# Patient Record
Sex: Female | Born: 1950 | Race: White | Hispanic: No | Marital: Married | Smoking: Former smoker
Health system: Southern US, Community
[De-identification: ages and names within clinical notes are randomized; demographics above are authoritative.]

## PROBLEM LIST (undated history)

## (undated) DIAGNOSIS — F329 Major depressive disorder, single episode, unspecified: Secondary | ICD-10-CM

## (undated) DIAGNOSIS — F32A Depression, unspecified: Secondary | ICD-10-CM

## (undated) DIAGNOSIS — M1990 Unspecified osteoarthritis, unspecified site: Secondary | ICD-10-CM

## (undated) DIAGNOSIS — K219 Gastro-esophageal reflux disease without esophagitis: Secondary | ICD-10-CM

## (undated) DIAGNOSIS — T8130XA Disruption of wound, unspecified, initial encounter: Secondary | ICD-10-CM

## (undated) DIAGNOSIS — F419 Anxiety disorder, unspecified: Secondary | ICD-10-CM

## (undated) DIAGNOSIS — I639 Cerebral infarction, unspecified: Secondary | ICD-10-CM

## (undated) DIAGNOSIS — I219 Acute myocardial infarction, unspecified: Secondary | ICD-10-CM

## (undated) HISTORY — PX: BACK SURGERY: SHX140

## (undated) HISTORY — PX: BREAST SURGERY: SHX581

## (undated) HISTORY — PX: TUBAL LIGATION: SHX77

## (undated) HISTORY — PX: APPENDECTOMY: SHX54

## (undated) HISTORY — PX: CHOLECYSTECTOMY: SHX55

---

## 1989-04-27 DIAGNOSIS — I219 Acute myocardial infarction, unspecified: Secondary | ICD-10-CM

## 1989-04-27 HISTORY — PX: CARDIAC CATHETERIZATION: SHX172

## 1989-04-27 HISTORY — DX: Acute myocardial infarction, unspecified: I21.9

## 1999-08-20 ENCOUNTER — Other Ambulatory Visit: Admission: RE | Admit: 1999-08-20 | Discharge: 1999-08-20 | Payer: Self-pay | Admitting: Family Medicine

## 2012-11-10 ENCOUNTER — Other Ambulatory Visit: Payer: Self-pay | Admitting: Neurological Surgery

## 2012-11-10 DIAGNOSIS — M48061 Spinal stenosis, lumbar region without neurogenic claudication: Secondary | ICD-10-CM

## 2015-06-04 ENCOUNTER — Other Ambulatory Visit: Payer: Self-pay | Admitting: Neurological Surgery

## 2015-06-12 ENCOUNTER — Encounter (HOSPITAL_COMMUNITY): Payer: Self-pay

## 2015-06-12 ENCOUNTER — Encounter (HOSPITAL_COMMUNITY)
Admission: RE | Admit: 2015-06-12 | Discharge: 2015-06-12 | Disposition: A | Discharge status: Home / Self Care | Payer: Medicare Other | Source: Ambulatory Visit | Attending: Neurological Surgery | Admitting: Neurological Surgery

## 2015-06-12 DIAGNOSIS — F329 Major depressive disorder, single episode, unspecified: Secondary | ICD-10-CM | POA: Diagnosis not present

## 2015-06-12 DIAGNOSIS — Z9049 Acquired absence of other specified parts of digestive tract: Secondary | ICD-10-CM | POA: Diagnosis not present

## 2015-06-12 DIAGNOSIS — M1990 Unspecified osteoarthritis, unspecified site: Secondary | ICD-10-CM | POA: Diagnosis not present

## 2015-06-12 DIAGNOSIS — I2510 Atherosclerotic heart disease of native coronary artery without angina pectoris: Secondary | ICD-10-CM | POA: Diagnosis not present

## 2015-06-12 DIAGNOSIS — Z6835 Body mass index (BMI) 35.0-35.9, adult: Secondary | ICD-10-CM | POA: Diagnosis not present

## 2015-06-12 DIAGNOSIS — Z87891 Personal history of nicotine dependence: Secondary | ICD-10-CM | POA: Diagnosis not present

## 2015-06-12 DIAGNOSIS — Z79899 Other long term (current) drug therapy: Secondary | ICD-10-CM | POA: Diagnosis not present

## 2015-06-12 DIAGNOSIS — M4806 Spinal stenosis, lumbar region: Secondary | ICD-10-CM | POA: Diagnosis not present

## 2015-06-12 HISTORY — DX: Depression, unspecified: F32.A

## 2015-06-12 HISTORY — DX: Acute myocardial infarction, unspecified: I21.9

## 2015-06-12 HISTORY — DX: Major depressive disorder, single episode, unspecified: F32.9

## 2015-06-12 HISTORY — DX: Unspecified osteoarthritis, unspecified site: M19.90

## 2015-06-12 LAB — BASIC METABOLIC PANEL
Anion gap: 8 (ref 5–15)
BUN: 12 mg/dL (ref 6–20)
CO2: 27 mmol/L (ref 22–32)
Calcium: 9.5 mg/dL (ref 8.9–10.3)
Chloride: 107 mmol/L (ref 101–111)
Creatinine, Ser: 0.72 mg/dL (ref 0.44–1.00)
GFR calc Af Amer: >60 mL/min (ref >60)
GFR calc non Af Amer: >60 mL/min (ref >60)
Glucose, Bld: 110 mg/dL — ABNORMAL HIGH (ref 65–99)
Potassium: 4 mmol/L (ref 3.5–5.1)
Sodium: 142 mmol/L (ref 135–145)

## 2015-06-12 LAB — CBC
HCT: 39.8 % (ref 36.0–46.0)
Hemoglobin: 13.4 g/dL (ref 12.0–15.0)
MCH: 31.8 pg (ref 26.0–34.0)
MCHC: 33.7 g/dL (ref 30.0–36.0)
MCV: 94.3 fL (ref 78.0–100.0)
Platelets: 287 10*3/uL (ref 150–400)
RBC: 4.22 MIL/uL (ref 3.87–5.11)
RDW: 12 % (ref 11.5–15.5)
WBC: 7.2 10*3/uL (ref 4.0–10.5)

## 2015-06-12 LAB — SURGICAL PCR SCREEN
MRSA, PCR: NEGATIVE
Staphylococcus aureus: POSITIVE — AB

## 2015-06-12 NOTE — Progress Notes (Signed)
Anesthesia PAT Evaluation: Patient is a 65 year old female scheduled for L3-4, L4-5, L5-S1 lumbar laminectomy (OR room booked for 3 hours) on 06/14/15 by Dr. Yetta Good.  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 Nicki Good, arthritis, depression, cholecystectomy (age 38), left breast cyst surgery, appendectomy (age 59), former smoker (quit 04/10/13). PCP is Dr. Jeannetta Good. She says she has not seen Dr. Tresa Good since 1992. BMI 35.69.  Meds include Tums, Cipro, Prilosec, Percocet, Sudafed (to hold), Requip, Zoloft.   PAT Vitals: BP 141/85, HR 89, RR 20, T 37, O2 sat 95%. Heart RRR, no murmur noted. Lungs clear. No carotid bruit noted. No ankle edema.   06/12/15 EKG: NSR.  Preoperative labs noted.  I evaluated patient during her PAT visit. She reports that in 1991 which suffering from a bad cold she developed severe chest pain. Dr. Jeannetta Good referred her to [name] Good. She is fairly certain that she ruled in for MI by cardiac enzymes. She had a cardiac cath by Dr. Nicki Good. She thinks she was told she had a 30% blockage. She did not require PCI. She states, "my arteries were trying to collapse on me" so I question if there was a vasospasm involved. Unfortunately, medical records cannot locate any cardiac records on this patient. Patient said previous maiden name was [name] Good and then [name] Good after her first marriage, but said she has been [name] Good since before her MI. She denied chest pain, SOB, syncope. She gets occasional brief sensations that her heart is racing. Her activity has been limited for years, particularly since 02/2015 due to back and right hip pain. Even prior to November she was having to rest during vacuuming due to back/hip pain. I advised that I would discuss with one of our anesthesiologists if we did not get cardiology records or if records received showed worrisome findings to see if she would require pre-operative cardiology evaluation.  I discussed  above with anesthesiologist Dr. Council Good. Patient has a questionable history of prior MI that did not require intervention. Event was > 25 years ago, and she has remained asymptomatic from a CV standpoint. She denied CHF or arrhythmia history. No ischemic changes noted on her EKG. If no acute changes then it is anticipated that she can proceed as planned.  [name] Good [facility] Short Stay Good/Anesthesiology Phone [phone] 06/12/2015 3:55 PM

## 2015-06-12 NOTE — Pre-Procedure Instructions (Addendum)
[name] Good  06/12/2015      [facility] - [place], [place] - [address] [place] [address] [address] [place] [address] Phone: [phone] Fax: [phone]    Your procedure is scheduled on 06/14/15  Report to [facility] Admitting at 915 A.M.  Call this number if you have problems the morning of surgery:  [phone]   Remember:  Do not eat food or drink liquids after midnight.  Take these medicines the morning of surgery with A SIP OF WATER prilosec,pain med  STOP all herbel meds, nsaids (aleve,naproxen,advil,ibuprofen) prior to surgery starting TODAY including aspirin ,vitamins,salonpas,relax and sleep,sudafed, vitD   Do not wear jewelry, make-up or nail polish.  Do not wear lotions, powders, or perfumes.  You may wear deodorant.  Do not shave 48 hours prior to surgery.  Men may shave face and neck.  Do not bring valuables to the hospital.  [facility] is not responsible for any belongings or valuables.  Contacts, dentures or bridgework may not be worn into surgery.  Leave your suitcase in the car.  After surgery it may be brought to your room.  For patients admitted to the hospital, discharge time will be determined by your treatment team.  Patients discharged the day of surgery will not be allowed to drive home.   Name and phone number of your driver:   Special instructions:   Special Instructions: [place] - Preparing for Surgery  Before surgery, you can play an important role.  Because skin is not sterile, your skin needs to be as free of germs as possible.  You can reduce the number of germs on you skin by washing with CHG (chlorahexidine gluconate) soap before surgery.  CHG is an antiseptic cleaner which kills germs and bonds with the skin to continue killing germs even after washing.  Please DO NOT use if you have an allergy to CHG or antibacterial soaps.  If your skin becomes reddened/irritated stop using the  CHG and inform your nurse when you arrive at Short Stay.  Do not shave (including legs and underarms) for at least 48 hours prior to the first CHG shower.  You may shave your face.  Please follow these instructions carefully:   1.  Shower with CHG Soap the night before surgery and the morning of Surgery.  2.  If you choose to wash your hair, wash your hair first as usual with your normal shampoo.  3.  After you shampoo, rinse your hair and body thoroughly to remove the Shampoo.  4.  Use CHG as you would any other liquid soap.  You can apply chg directly  to the skin and wash gently with scrungie or a clean washcloth.  5.  Apply the CHG Soap to your body ONLY FROM THE NECK DOWN.  Do not use on open wounds or open sores.  Avoid contact with your eyes ears, mouth and genitals (private parts).  Wash genitals (private parts)       with your normal soap.  6.  Wash thoroughly, paying special attention to the area where your surgery will be performed.  7.  Thoroughly rinse your body with warm water from the neck down.  8.  DO NOT shower/wash with your normal soap after using and rinsing off the CHG Soap.  9.  Pat yourself dry with a clean towel.            10.  Wear clean  pajamas.            11.  Place clean sheets on your bed the night of your first shower and do not sleep with pets.  Day of Surgery  Do not apply any lotions/deodorants the morning of surgery.  Please wear clean clothes to the hospital/surgery center.  Please read over the following fact sheets that you were given. Pain Booklet, Coughing and Deep Breathing, MRSA Information and Surgical Site Infection Prevention

## 2015-06-13 MED ORDER — CEFAZOLIN SODIUM-DEXTROSE 2-3 GM-% IV SOLR
2.0000 g | INTRAVENOUS | Status: AC
  Administered 2015-06-14: 2 g via INTRAVENOUS
  Filled 2015-06-13: qty 50

## 2015-06-14 ENCOUNTER — Encounter (HOSPITAL_COMMUNITY): Payer: Self-pay | Admitting: *Deleted

## 2015-06-14 ENCOUNTER — Ambulatory Visit (HOSPITAL_COMMUNITY): Payer: Medicare Other | Admitting: Anesthesiology

## 2015-06-14 ENCOUNTER — Ambulatory Visit (HOSPITAL_COMMUNITY)
Admission: RE | Admit: 2015-06-14 | Discharge: 2015-06-15 | Disposition: A | Discharge status: Home / Self Care | Payer: Medicare Other | Source: Ambulatory Visit | Attending: Neurological Surgery | Admitting: Neurological Surgery

## 2015-06-14 ENCOUNTER — Ambulatory Visit (HOSPITAL_COMMUNITY): Payer: Medicare Other | Admitting: Vascular Surgery

## 2015-06-14 ENCOUNTER — Encounter (HOSPITAL_COMMUNITY)
Admission: RE | Disposition: A | Discharge status: Home / Self Care | Payer: Self-pay | Source: Ambulatory Visit | Attending: Neurological Surgery

## 2015-06-14 ENCOUNTER — Ambulatory Visit (HOSPITAL_COMMUNITY): Payer: Medicare Other

## 2015-06-14 DIAGNOSIS — I2510 Atherosclerotic heart disease of native coronary artery without angina pectoris: Secondary | ICD-10-CM | POA: Insufficient documentation

## 2015-06-14 DIAGNOSIS — F329 Major depressive disorder, single episode, unspecified: Secondary | ICD-10-CM | POA: Insufficient documentation

## 2015-06-14 DIAGNOSIS — M4806 Spinal stenosis, lumbar region: Secondary | ICD-10-CM | POA: Diagnosis not present

## 2015-06-14 DIAGNOSIS — Z87891 Personal history of nicotine dependence: Secondary | ICD-10-CM | POA: Insufficient documentation

## 2015-06-14 DIAGNOSIS — Z9889 Other specified postprocedural states: Secondary | ICD-10-CM

## 2015-06-14 DIAGNOSIS — M1990 Unspecified osteoarthritis, unspecified site: Secondary | ICD-10-CM | POA: Insufficient documentation

## 2015-06-14 DIAGNOSIS — Z79899 Other long term (current) drug therapy: Secondary | ICD-10-CM | POA: Insufficient documentation

## 2015-06-14 DIAGNOSIS — Z419 Encounter for procedure for purposes other than remedying health state, unspecified: Secondary | ICD-10-CM

## 2015-06-14 DIAGNOSIS — Z6835 Body mass index (BMI) 35.0-35.9, adult: Secondary | ICD-10-CM | POA: Insufficient documentation

## 2015-06-14 DIAGNOSIS — Z9049 Acquired absence of other specified parts of digestive tract: Secondary | ICD-10-CM | POA: Insufficient documentation

## 2015-06-14 HISTORY — PX: LUMBAR LAMINECTOMY/DECOMPRESSION MICRODISCECTOMY: SHX5026

## 2015-06-14 IMAGING — CR DG LUMBAR SPINE 2-3V
2 series · 2 of 2 positions shown · non-contrast
Comparison: None.

CLINICAL DATA: L3-L4, L4-L5 and L5-S1 laminectomy

EXAM:
LUMBAR SPINE - 2-3 VIEW

[lat (1 of 2)]
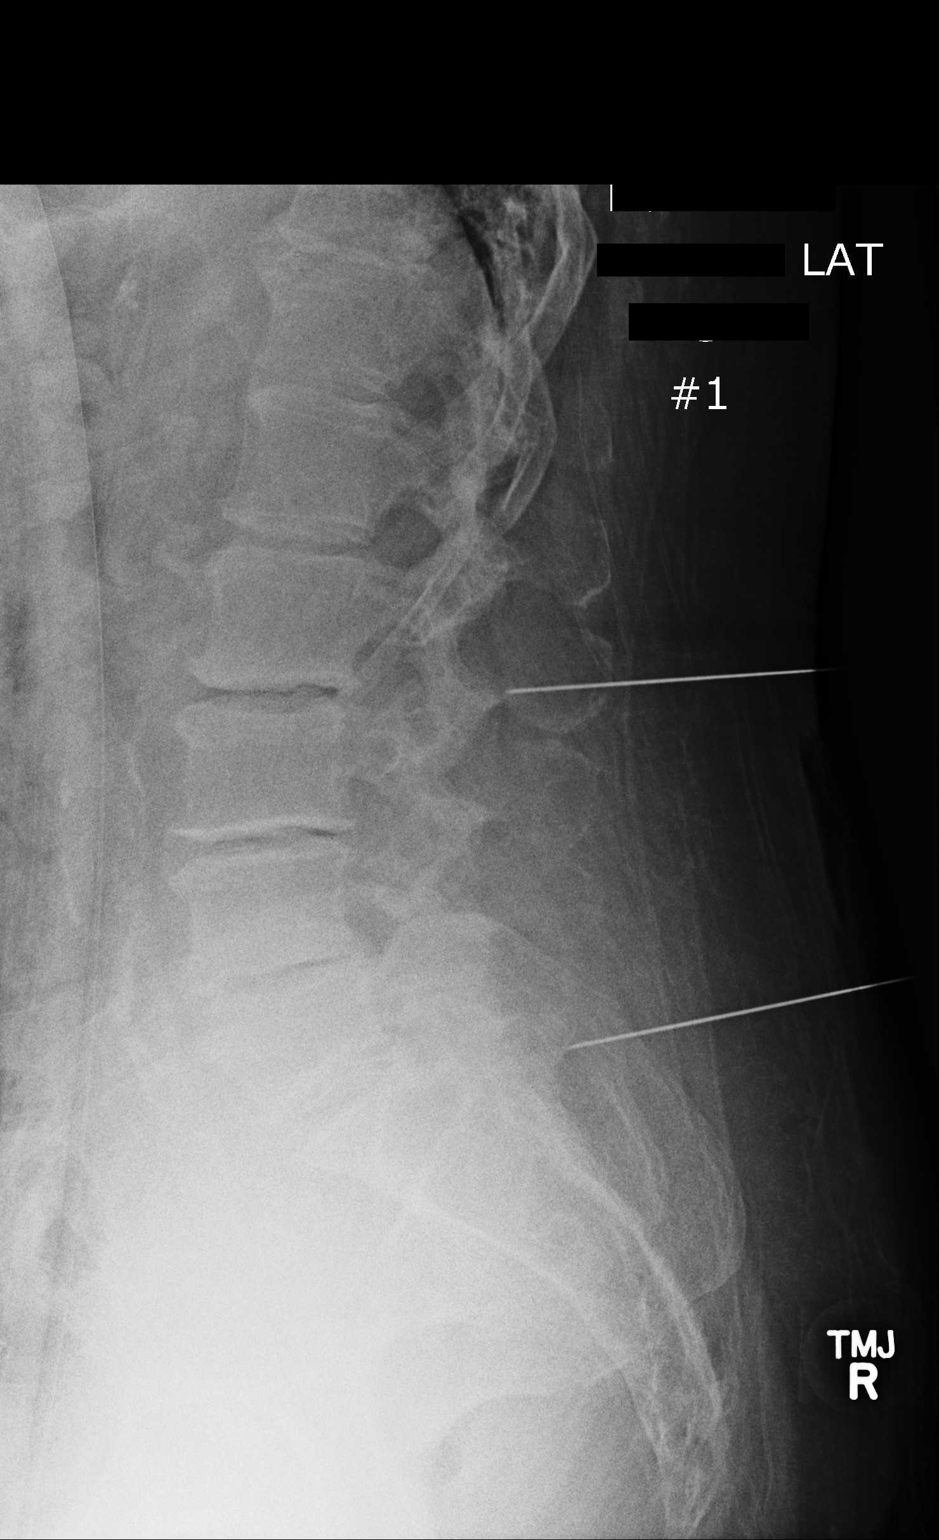

[lat (2 of 2)]
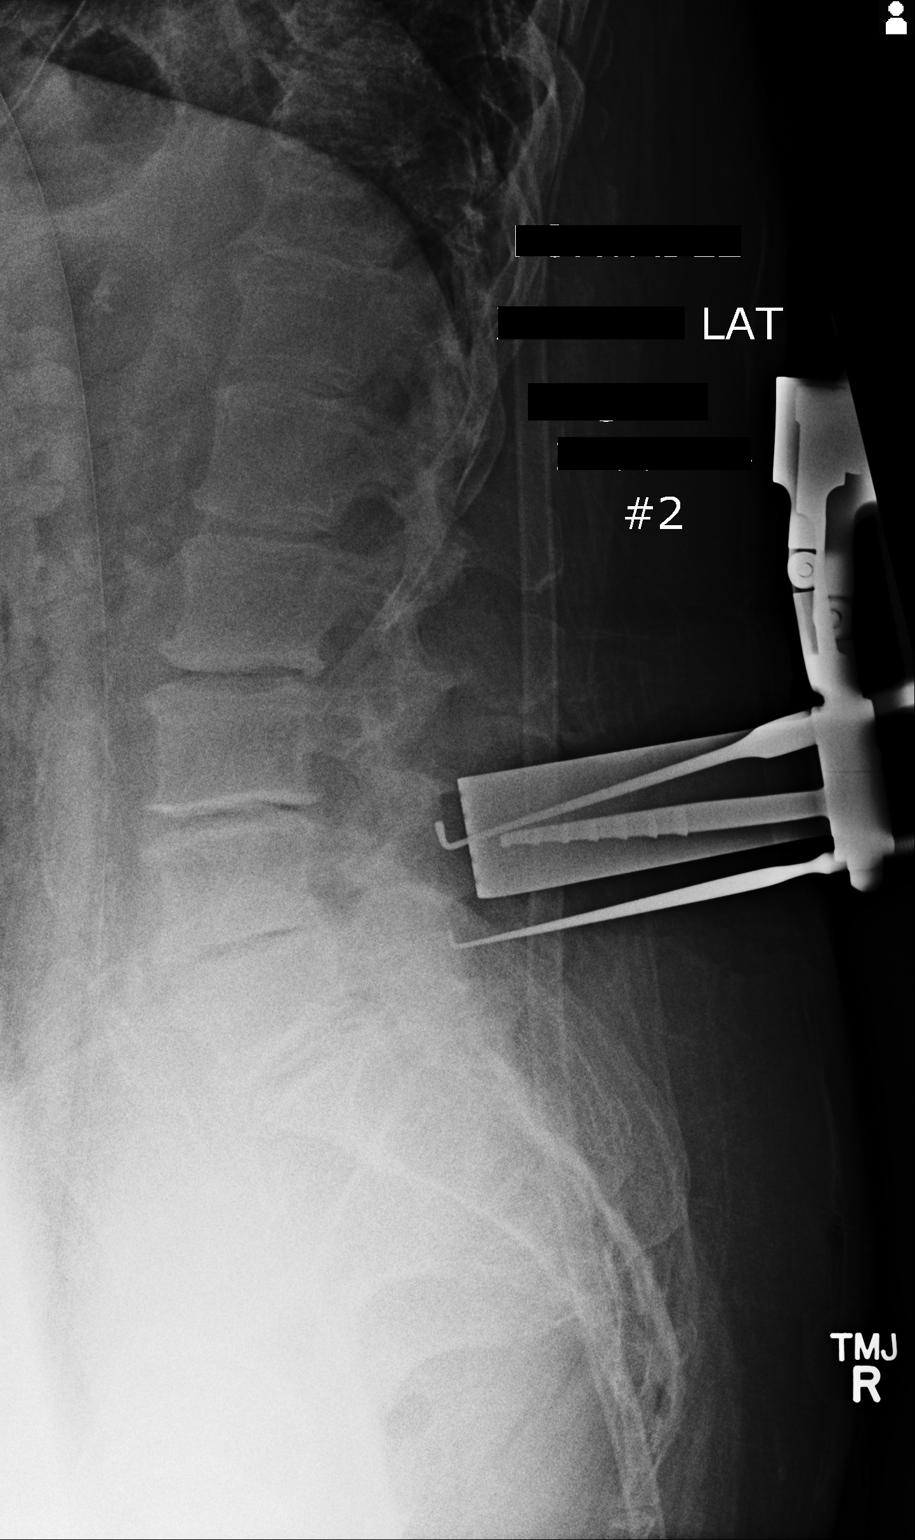

[2 of 2 positions shown; findings below may reference images not displayed]

FINDINGS: Two lateral views of the lumbar spine submitted. First film
demonstrates a posterior localization needle at the level of L2-L3
disc space. There is disc space flattening with vacuum disc
phenomenon at L2-L3, L3-L4 and L4-L5 level. A second localization
needle is noted posteriorly at L5-S1 level. Second film demonstrates
a L shape localization instrument posteriorly at the level of L3 L4
disc space just inferior to spinous process of L3. A second
localization posterior instrument at the level of L5 vertebral body
just inferiorly 2 the level of upper endplate of L5.
IMPRESSION: First film demonstrates a posterior localization needle at the level
of L2-L3 disc space. There is disc space flattening with vacuum disc
phenomenon at L2-L3, L3-L4 and L4-L5 level. A second localization
needle is noted posteriorly at L5-S1 level. Second film demonstrates
a L shape localization instrument posteriorly at the level of L3 L4
disc space just inferior to spinous process of L3. A second
localization posterior instrument at the level of L5 vertebral body
just inferiorly 2 the level of upper endplate of L5.

## 2015-06-14 SURGERY — LUMBAR LAMINECTOMY/DECOMPRESSION MICRODISCECTOMY 3 LEVELS
Anesthesia: General | Site: Spine Lumbar

## 2015-06-14 MED ORDER — MENTHOL 3 MG MT LOZG
1.0000 | LOZENGE | OROMUCOSAL | Status: DC | PRN
  Filled 2015-06-14: qty 9

## 2015-06-14 MED ORDER — PHENOL 1.4 % MT LIQD: 1.0000 | OROMUCOSAL | Status: DC | PRN

## 2015-06-14 MED ORDER — MIDAZOLAM HCL 5 MG/5ML IJ SOLN
INTRAMUSCULAR | Status: DC | PRN
  Administered 2015-06-14: 2 mg via INTRAVENOUS

## 2015-06-14 MED ORDER — OXYCODONE HCL 5 MG/5ML PO SOLN: 5.0000 mg | Freq: Once | ORAL | Status: DC | PRN

## 2015-06-14 MED ORDER — POTASSIUM CHLORIDE IN NACL 20-0.9 MEQ/L-% IV SOLN
INTRAVENOUS | Status: DC
  Filled 2015-06-14 (×3): qty 1000

## 2015-06-14 MED ORDER — BUPIVACAINE HCL (PF) 0.25 % IJ SOLN
INTRAMUSCULAR | Status: DC | PRN
  Administered 2015-06-14: 5 mL

## 2015-06-14 MED ORDER — ACETAMINOPHEN 325 MG PO TABS: 650.0000 mg | ORAL_TABLET | ORAL | Status: DC | PRN

## 2015-06-14 MED ORDER — SODIUM CHLORIDE 0.9 % IR SOLN
Status: DC | PRN
  Administered 2015-06-14: 500 mL

## 2015-06-14 MED ORDER — NEOSTIGMINE METHYLSULFATE 10 MG/10ML IV SOLN
INTRAVENOUS | Status: DC | PRN
  Administered 2015-06-14: 4 mg via INTRAVENOUS

## 2015-06-14 MED ORDER — VANCOMYCIN HCL IN DEXTROSE 1-5 GM/200ML-% IV SOLN
1000.0000 mg | Freq: Once | INTRAVENOUS | Status: AC
  Administered 2015-06-14: 1000 mg via INTRAVENOUS
  Filled 2015-06-14: qty 200

## 2015-06-14 MED ORDER — OXYCODONE-ACETAMINOPHEN 10-325 MG PO TABS: 1.0000 | ORAL_TABLET | ORAL | Status: DC | PRN

## 2015-06-14 MED ORDER — ARTIFICIAL TEARS OP OINT
TOPICAL_OINTMENT | OPHTHALMIC | Status: AC
  Filled 2015-06-14: qty 3.5

## 2015-06-14 MED ORDER — ACETAMINOPHEN 325 MG PO TABS: 325.0000 mg | ORAL_TABLET | ORAL | Status: DC | PRN

## 2015-06-14 MED ORDER — METHOCARBAMOL 1000 MG/10ML IJ SOLN
500.0000 mg | Freq: Once | INTRAVENOUS | Status: AC
  Administered 2015-06-14: 500 mg via INTRAVENOUS
  Filled 2015-06-14: qty 5

## 2015-06-14 MED ORDER — ACETAMINOPHEN 650 MG RE SUPP: 650.0000 mg | RECTAL | Status: DC | PRN

## 2015-06-14 MED ORDER — 0.9 % SODIUM CHLORIDE (POUR BTL) OPTIME
TOPICAL | Status: DC | PRN
  Administered 2015-06-14: 1000 mL

## 2015-06-14 MED ORDER — OXYCODONE-ACETAMINOPHEN 5-325 MG PO TABS
1.0000 | ORAL_TABLET | ORAL | Status: DC | PRN
Start: 2015-06-14 — End: 2015-06-15
  Administered 2015-06-14 – 2015-06-15 (×4): 1 via ORAL
  Filled 2015-06-14 (×4): qty 1

## 2015-06-14 MED ORDER — SODIUM CHLORIDE 0.9% FLUSH
3.0000 mL | Freq: Two times a day (BID) | INTRAVENOUS | Status: DC
  Administered 2015-06-14: 3 mL via INTRAVENOUS

## 2015-06-14 MED ORDER — FENTANYL CITRATE (PF) 250 MCG/5ML IJ SOLN
INTRAMUSCULAR | Status: AC
  Filled 2015-06-14: qty 5

## 2015-06-14 MED ORDER — ROCURONIUM BROMIDE 50 MG/5ML IV SOLN
INTRAVENOUS | Status: AC
  Filled 2015-06-14: qty 1

## 2015-06-14 MED ORDER — ACETAMINOPHEN 160 MG/5ML PO SOLN: 325.0000 mg | ORAL | Status: DC | PRN

## 2015-06-14 MED ORDER — ONDANSETRON HCL 4 MG/2ML IJ SOLN
INTRAMUSCULAR | Status: DC | PRN
  Administered 2015-06-14: 4 mg via INTRAVENOUS

## 2015-06-14 MED ORDER — PROPOFOL 10 MG/ML IV BOLUS
INTRAVENOUS | Status: DC | PRN
  Administered 2015-06-14: 100 mg via INTRAVENOUS
  Administered 2015-06-14: 50 mg via INTRAVENOUS

## 2015-06-14 MED ORDER — MORPHINE SULFATE (PF) 2 MG/ML IV SOLN
1.0000 mg | INTRAVENOUS | Status: DC | PRN
  Administered 2015-06-14: 2 mg via INTRAVENOUS
  Filled 2015-06-14: qty 1

## 2015-06-14 MED ORDER — ROPINIROLE HCL 1 MG PO TABS
0.5000 mg | ORAL_TABLET | Freq: Every day | ORAL | Status: DC
  Administered 2015-06-14: 0.5 mg via ORAL
  Filled 2015-06-14: qty 1

## 2015-06-14 MED ORDER — CIPROFLOXACIN HCL 500 MG PO TABS
500.0000 mg | ORAL_TABLET | Freq: Two times a day (BID) | ORAL | Status: DC
  Filled 2015-06-14 (×4): qty 1

## 2015-06-14 MED ORDER — OXYCODONE HCL 5 MG PO TABS: 5.0000 mg | ORAL_TABLET | Freq: Once | ORAL | Status: DC | PRN

## 2015-06-14 MED ORDER — THROMBIN 5000 UNITS EX SOLR
OROMUCOSAL | Status: DC | PRN
  Administered 2015-06-14: 5 mL via TOPICAL

## 2015-06-14 MED ORDER — ONDANSETRON HCL 4 MG/2ML IJ SOLN
4.0000 mg | INTRAMUSCULAR | Status: DC | PRN
  Administered 2015-06-14 – 2015-06-15 (×2): 4 mg via INTRAVENOUS
  Filled 2015-06-14 (×2): qty 2

## 2015-06-14 MED ORDER — LIDOCAINE HCL (CARDIAC) 20 MG/ML IV SOLN
INTRAVENOUS | Status: AC
  Filled 2015-06-14: qty 5

## 2015-06-14 MED ORDER — ROCURONIUM BROMIDE 100 MG/10ML IV SOLN
INTRAVENOUS | Status: DC | PRN
  Administered 2015-06-14: 50 mg via INTRAVENOUS

## 2015-06-14 MED ORDER — PROPOFOL 10 MG/ML IV BOLUS
INTRAVENOUS | Status: AC
  Filled 2015-06-14: qty 20

## 2015-06-14 MED ORDER — MIDAZOLAM HCL 2 MG/2ML IJ SOLN
INTRAMUSCULAR | Status: AC
Start: 2015-06-14 — End: 2015-06-14
  Filled 2015-06-14: qty 2

## 2015-06-14 MED ORDER — THROMBIN 20000 UNITS EX SOLR
CUTANEOUS | Status: DC | PRN
  Administered 2015-06-14: 20 mL via TOPICAL

## 2015-06-14 MED ORDER — LACTATED RINGERS IV SOLN
INTRAVENOUS | Status: DC
  Administered 2015-06-14 (×2): via INTRAVENOUS

## 2015-06-14 MED ORDER — METHOCARBAMOL 500 MG PO TABS
500.0000 mg | ORAL_TABLET | Freq: Four times a day (QID) | ORAL | Status: DC | PRN
Start: 2015-06-14 — End: 2015-06-15
  Administered 2015-06-14 – 2015-06-15 (×2): 500 mg via ORAL
  Filled 2015-06-14 (×2): qty 1

## 2015-06-14 MED ORDER — GLYCOPYRROLATE 0.2 MG/ML IJ SOLN
INTRAMUSCULAR | Status: DC | PRN
  Administered 2015-06-14: 0.6 mg via INTRAVENOUS

## 2015-06-14 MED ORDER — FENTANYL CITRATE (PF) 250 MCG/5ML IJ SOLN
INTRAMUSCULAR | Status: DC | PRN
  Administered 2015-06-14 (×3): 50 ug via INTRAVENOUS
  Administered 2015-06-14 (×2): 100 ug via INTRAVENOUS

## 2015-06-14 MED ORDER — HYDROMORPHONE HCL 1 MG/ML IJ SOLN
0.2500 mg | INTRAMUSCULAR | Status: DC | PRN
  Administered 2015-06-14 (×2): 0.5 mg via INTRAVENOUS

## 2015-06-14 MED ORDER — HYDROMORPHONE HCL 1 MG/ML IJ SOLN
INTRAMUSCULAR | Status: AC
  Filled 2015-06-14: qty 1

## 2015-06-14 MED ORDER — OXYCODONE HCL 5 MG PO TABS
5.0000 mg | ORAL_TABLET | ORAL | Status: DC | PRN
Start: 2015-06-14 — End: 2015-06-15
  Administered 2015-06-14 – 2015-06-15 (×4): 5 mg via ORAL
  Filled 2015-06-14 (×4): qty 1

## 2015-06-14 MED ORDER — LIDOCAINE HCL (CARDIAC) 20 MG/ML IV SOLN
INTRAVENOUS | Status: DC | PRN
  Administered 2015-06-14: 50 mg via INTRATRACHEAL

## 2015-06-14 MED ORDER — SODIUM CHLORIDE 0.9% FLUSH: 3.0000 mL | INTRAVENOUS | Status: DC | PRN

## 2015-06-14 SURGICAL SUPPLY — 49 items
APL SKNCLS STERI-STRIP NONHPOA (GAUZE/BANDAGES/DRESSINGS) ×1
BAG DECANTER FOR FLEXI CONT (MISCELLANEOUS) ×2 IMPLANT
BENZOIN TINCTURE PRP APPL 2/3 (GAUZE/BANDAGES/DRESSINGS) ×2 IMPLANT
BUR MATCHSTICK NEURO 3.0 LAGG (BURR) ×2 IMPLANT
CANISTER SUCT 3000ML PPV (MISCELLANEOUS) ×2 IMPLANT
DRAPE LAPAROTOMY 100X72X124 (DRAPES) ×2 IMPLANT
DRAPE MICROSCOPE LEICA (MISCELLANEOUS) ×2 IMPLANT
DRAPE POUCH INSTRU U-SHP 10X18 (DRAPES) ×2 IMPLANT
DRAPE SURG 17X23 STRL (DRAPES) ×2 IMPLANT
DRSG OPSITE POSTOP 4X6 (GAUZE/BANDAGES/DRESSINGS) ×1 IMPLANT
DURAPREP 26ML APPLICATOR (WOUND CARE) ×2 IMPLANT
ELECT REM PT RETURN 9FT ADLT (ELECTROSURGICAL) ×2
ELECTRODE REM PT RTRN 9FT ADLT (ELECTROSURGICAL) ×1 IMPLANT
GAUZE SPONGE 4X4 16PLY XRAY LF (GAUZE/BANDAGES/DRESSINGS) IMPLANT
GLOVE BIO SURGEON STRL SZ 6.5 (GLOVE) ×1 IMPLANT
GLOVE BIO SURGEON STRL SZ8 (GLOVE) ×3 IMPLANT
GLOVE BIOGEL PI IND STRL 6.5 (GLOVE) IMPLANT
GLOVE BIOGEL PI IND STRL 7.0 (GLOVE) IMPLANT
GLOVE BIOGEL PI IND STRL 8.5 (GLOVE) ×1 IMPLANT
GLOVE BIOGEL PI INDICATOR 6.5 (GLOVE) ×1
GLOVE BIOGEL PI INDICATOR 7.0 (GLOVE) ×2
GLOVE BIOGEL PI INDICATOR 8.5 (GLOVE) ×1
GLOVE SURG SS PI 6.5 STRL IVOR (GLOVE) ×2 IMPLANT
GOWN STRL REUS W/ TWL LRG LVL3 (GOWN DISPOSABLE) IMPLANT
GOWN STRL REUS W/ TWL XL LVL3 (GOWN DISPOSABLE) ×1 IMPLANT
GOWN STRL REUS W/TWL 2XL LVL3 (GOWN DISPOSABLE) IMPLANT
GOWN STRL REUS W/TWL LRG LVL3 (GOWN DISPOSABLE) ×4
GOWN STRL REUS W/TWL XL LVL3 (GOWN DISPOSABLE) ×4
HEMOSTAT POWDER KIT SURGIFOAM (HEMOSTASIS) ×1 IMPLANT
KIT BASIN OR (CUSTOM PROCEDURE TRAY) ×2 IMPLANT
KIT ROOM TURNOVER OR (KITS) ×2 IMPLANT
LIQUID BAND (GAUZE/BANDAGES/DRESSINGS) ×1 IMPLANT
NDL HYPO 25X1 1.5 SAFETY (NEEDLE) ×1 IMPLANT
NEEDLE HYPO 25X1 1.5 SAFETY (NEEDLE) ×2 IMPLANT
NEEDLE SPNL 20GX3.5 QUINCKE YW (NEEDLE) ×4 IMPLANT
NS IRRIG 1000ML POUR BTL (IV SOLUTION) ×2 IMPLANT
PACK LAMINECTOMY NEURO (CUSTOM PROCEDURE TRAY) ×2 IMPLANT
PAD ARMBOARD 7.5X6 YLW CONV (MISCELLANEOUS) ×8 IMPLANT
RUBBERBAND STERILE (MISCELLANEOUS) ×4 IMPLANT
SPONGE SURGIFOAM ABS GEL 100 (HEMOSTASIS) ×1 IMPLANT
SPONGE SURGIFOAM ABS GEL SZ50 (HEMOSTASIS) ×1 IMPLANT
STRIP CLOSURE SKIN 1/2X4 (GAUZE/BANDAGES/DRESSINGS) ×2 IMPLANT
SUT VIC AB 0 CT1 18XCR BRD8 (SUTURE) ×1 IMPLANT
SUT VIC AB 0 CT1 8-18 (SUTURE) ×4
SUT VIC AB 2-0 CP2 18 (SUTURE) ×3 IMPLANT
SUT VIC AB 3-0 SH 8-18 (SUTURE) ×3 IMPLANT
TOWEL OR 17X24 6PK STRL BLUE (TOWEL DISPOSABLE) ×2 IMPLANT
TOWEL OR 17X26 10 PK STRL BLUE (TOWEL DISPOSABLE) ×2 IMPLANT
WATER STERILE IRR 1000ML POUR (IV SOLUTION) ×2 IMPLANT

## 2015-06-14 NOTE — H&P (Signed)
Subjective: Patient is a 65 y.o. female admitted for spinal stenosis. Onset of symptoms was several months ago, gradually worsening since that time.  The pain is rated severe, and is located at the across the lower back and radiates to her legs. The pain is described as aching and occurs intermittently. The symptoms have been progressive. Symptoms are exacerbated by exercise. MRI or CT showed severe spinal stenosis L3-4 L4-5 L5-S1   Past Medical History  Diagnosis Date  . Myocardial infarction (HCC) 91    no visits to cardiac dr(thomas kelly) since 92  . Depression   . Arthritis     Past Surgical History  Procedure Laterality Date  . Cardiac catheterization  91  . Breast surgery Left     cyst  . Cholecystectomy      65 yrs old  . Tubal ligation    . Appendectomy      17 yrs    Prior to Admission medications   Medication Sig Start Date End Date Taking? Authorizing Provider  calcium carbonate (TUMS EX) 750 MG chewable tablet Chew 1 tablet by mouth 2 (two) times daily as needed for heartburn.   Yes Historical Provider, MD  ciprofloxacin (CIPRO) 500 MG tablet Take 500 mg by mouth 2 (two) times daily.   Yes Historical Provider, MD  Liniments Chinita Pester) PADS Apply 1 each topically daily.   Yes Historical Provider, MD  Misc Natural Products (RELAX & SLEEP PO) Take 1 tablet by mouth at bedtime.   Yes Historical Provider, MD  omeprazole (PRILOSEC) 20 MG capsule Take 20 mg by mouth daily as needed.   Yes Historical Provider, MD  oxyCODONE-acetaminophen (PERCOCET) 10-325 MG tablet Take 1 tablet by mouth every 4 (four) hours as needed for pain.   Yes Historical Provider, MD  pseudoephedrine (SUDAFED) 30 MG tablet Take 30 mg by mouth daily as needed for congestion.   Yes Historical Provider, MD  rOPINIRole (REQUIP) 0.5 MG tablet Take 0.5 mg by mouth at bedtime. 1-3 hours before bedtime   Yes Historical Provider, MD  sertraline (ZOLOFT) 100 MG tablet Take 100 mg by mouth daily.   Yes Historical  Provider, MD  sodium chloride (OCEAN) 0.65 % SOLN nasal spray Place 1 spray into both nostrils as needed for congestion.   Yes Historical Provider, MD  Vitamin D, Cholecalciferol, 400 units TABS Take 2 tablets by mouth daily.   Yes Historical Provider, MD   Allergies  Allergen Reactions  . Amoxicillin Other (See Comments)    Pt states it made her feel very sick    Social History  Substance Use Topics  . Smoking status: Former Smoker -- 2.00 packs/day for 40 years    Types: Cigarettes    Quit date: 04/10/2013  . Smokeless tobacco: Not on file  . Alcohol Use: No    History reviewed. No pertinent family history.   Review of Systems  Positive ROS: Negative  All other systems have been reviewed and were otherwise negative with the exception of those mentioned in the HPI and as above.  Objective: Vital signs in last 24 hours: Temp:  [98 F (36.7 C)] 98 F (36.7 C) (02/17 0858) Pulse Rate:  [85] 85 (02/17 0858) Resp:  [18] 18 (02/17 0858) BP: (131)/(71) 131/71 mmHg (02/17 0858) SpO2:  [98 %] 98 % (02/17 0858) Weight:  [89.994 kg (198 lb 6.4 oz)] 89.994 kg (198 lb 6.4 oz) (02/17 0858)  General Appearance: Alert, cooperative, no distress, appears stated age Head: Normocephalic, without obvious abnormality,  atraumatic Eyes: PERRL, conjunctiva/corneas clear, EOM's intact    Neck: Supple, symmetrical, trachea midline Back: Symmetric, no curvature, ROM normal, no CVA tenderness Lungs:  respirations unlabored Heart: Regular rate and rhythm Abdomen: Soft, non-tender Extremities: Extremities normal, atraumatic, no cyanosis or edema Pulses: 2+ and symmetric all extremities Skin: Skin color, texture, turgor normal, no rashes or lesions  NEUROLOGIC:   Mental status: Alert and oriented x4,  no aphasia, good attention span, fund of knowledge, and memory Motor Exam - grossly normal Sensory Exam - grossly normal Reflexes: 1+ Coordination - grossly normal Gait - grossly normal Balance  - grossly normal Cranial Nerves: I: smell Not tested  II: visual acuity  OS: nl    OD: nl  II: visual fields Full to confrontation  II: pupils Equal, round, reactive to light  III,VII: ptosis None  III,IV,VI: extraocular muscles  Full ROM  V: mastication Normal  V: facial light touch sensation  Normal  V,VII: corneal reflex  Present  VII: facial muscle function - upper  Normal  VII: facial muscle function - lower Normal  VIII: hearing Not tested  IX: soft palate elevation  Normal  IX,X: gag reflex Present  XI: trapezius strength  5/5  XI: sternocleidomastoid strength 5/5  XI: neck flexion strength  5/5  XII: tongue strength  Normal    Data Review Lab Results  Component Value Date   WBC 7.2 06/12/2015   HGB 13.4 06/12/2015   HCT 39.8 06/12/2015   MCV 94.3 06/12/2015   PLT 287 06/12/2015   Lab Results  Component Value Date   NA 142 06/12/2015   K 4.0 06/12/2015   CL 107 06/12/2015   CO2 27 06/12/2015   BUN 12 06/12/2015   CREATININE 0.72 06/12/2015   GLUCOSE 110* 06/12/2015   No results found for: INR, PROTIME  Assessment/Plan: Patient admitted for lumbar laminectomy for stenosis L3-4 L4-5 L5-S1. Patient has failed a reasonable attempt at conservative therapy.  I explained the condition and procedure to the patient and answered any questions.  Patient wishes to proceed with procedure as planned. Understands risks/ benefits and typical outcomes of procedure.   JONES,DAVID S 06/14/2015 11:35 AM

## 2015-06-14 NOTE — Anesthesia Preprocedure Evaluation (Addendum)
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  Denies signs or symptoms of angina or heart failure currently  Her RCRI is < 1% so we will proceed with elective non-cardiac surgery per 2014 ACC/AHA guidelines   Neuro/Psych Depression negative neurological ROS     GI/Hepatic negative GI ROS, Neg liver ROS,   Endo/Other  Morbid obesity  Renal/GU negative Renal ROS     Musculoskeletal  (+) Arthritis ,   Abdominal   Peds  Hematology negative hematology ROS (+)   Anesthesia Other Findings   Reproductive/Obstetrics negative OB ROS                           Anesthesia Physical Anesthesia Plan  ASA: II  Anesthesia Plan: General   Post-op Pain Management:    Induction: Intravenous  Airway Management Planned: Oral ETT  Additional Equipment: None  Intra-op Plan:   Post-operative Plan: Extubation in OR  Informed Consent: I have reviewed the patients History and Physical, chart, labs and discussed the procedure including the risks, benefits and alternatives for the proposed anesthesia with the patient or authorized representative who has indicated his/her understanding and acceptance.   Dental Advisory Given and Dental advisory given  Plan Discussed with: Anesthesiologist, CRNA and  Surgeon  Anesthesia Plan Comments:        Anesthesia Quick Evaluation

## 2015-06-14 NOTE — Progress Notes (Signed)
Vancomycin 1 g IV x1 for post-op prophylaxis. No drian. Scr stable, no s/sx infection.  [name] Good  06/14/2015 5:07 PM

## 2015-06-14 NOTE — Op Note (Signed)
06/14/2015  2:28 PM  PATIENT:  [name] Good  65 y.o. female  PRE-OPERATIVE DIAGNOSIS:  Severe lumbar spinal stenosis L3-4 L4-5 L5-S1 with right leg pain  POST-OPERATIVE DIAGNOSIS:  Same  PROCEDURE:  Decompressive lumbar laminectomy medial facetectomy foraminotomies L3-4 L4-5 and L5-S1 on the right with sublaminar decompression at each level  SURGEON:  Marikay Alar, MD  ASSISTANTS: Dr. Venetia Maxon  ANESTHESIA:   General  EBL: 100 ml  Total I/O In: 500 [I.V.:500] Out: 100 [Blood:100]  BLOOD ADMINISTERED:none  DRAINS: None   SPECIMEN:  No Specimen  INDICATION FOR PROCEDURE: This patient presented with back and right leg pain. MRI showed severe spinal stenosis L3-4 L4-5 L5-S1. She tried medical management without relief. Recommended decompressive lumbar laminectomy. Patient understood the risks, benefits, and alternatives and potential outcomes and wished to proceed.  PROCEDURE DETAILS: The patient was taken to the operating room and after induction of adequate generalized endotracheal anesthesia, the patient was rolled into the prone position on the Wilson frame and all pressure points were padded. The lumbar region was cleaned and then prepped with DuraPrep and draped in the usual sterile fashion. 5 cc of local anesthesia was injected and then a dorsal midline incision was made and carried down to the lumbo sacral fascia. The fascia was opened and the paraspinous musculature was taken down in a subperiosteal fashion to expose L3-4 L4-5 and L5-S1 on the right. Intraoperative x-ray confirmed my level, and then I used a combination of the high-speed drill and the Kerrison punches to perform a hemilaminectomy, medial facetectomy, and foraminotomy at L3-4 L4-5 and L5-S1 on the right. The underlying yellow ligament was opened and removed in a piecemeal fashion to expose the underlying dura and exiting nerve root. I undercut the lateral recess and dissected down until I was medial to and distal to  the pedicle. There was severe overgrowth of the facet and the yellow ligament. There appeared to be severe stenosis at each level. I drilled up under the spinous process at each level and performed a sublaminar decompression to decompress the central canal and the opposite lateral recess. I then palpated with a coronary dilator along the nerve root and into the foramen to assure adequate decompression at each level. I felt no more compression of the nerve root. I irrigated with saline solution containing bacitracin. Achieved hemostasis with bipolar cautery, lined the dura with Gelfoam, and then closed the fascia with 0 Vicryl. I closed the subcutaneous tissues with 2-0 Vicryl and the subcuticular tissues with 3-0 Vicryl. The skin was then closed with benzoin and Steri-Strips. The drapes were removed, a sterile dressing was applied. The patient was awakened from general anesthesia and transferred to the recovery room in stable condition. At the end of the procedure all sponge, needle and instrument counts were correct.   PLAN OF CARE: Admit to inpatient   PATIENT DISPOSITION:  PACU - hemodynamically stable.   Delay start of Pharmacological VTE agent (>24hrs) due to surgical blood loss or risk of bleeding:  yes

## 2015-06-14 NOTE — Anesthesia Postprocedure Evaluation (Signed)
Anesthesia Post Note  Patient: [name] Good  Procedure(s) Performed: Procedure(s) (LRB): Lumbar Three-Four,Lumbar Four-Five, Lumbar Five-Sacral One Laminectomy (N/A)  Patient location during evaluation: PACU Anesthesia Type: General Level of consciousness: awake and alert Pain management: pain level controlled Vital Signs Assessment: post-procedure vital signs reviewed and stable Respiratory status: spontaneous breathing, nonlabored ventilation and respiratory function stable Cardiovascular status: blood pressure returned to baseline and stable Postop Assessment: no signs of nausea or vomiting Anesthetic complications: no    Last Vitals:  Filed Vitals:   06/14/15 0858 06/14/15 1424  BP: 131/71 137/76  Pulse: 85 93  Temp: 36.7 C 36.2 C  Resp: 18 23    Last Pain:  Filed Vitals:   06/14/15 1432  PainSc: Asleep                 Reino Kent

## 2015-06-14 NOTE — Transfer of Care (Signed)
Immediate Anesthesia Transfer of Care Note  Patient: [name] Good  Procedure(s) Performed: Procedure(s): Lumbar Three-Four,Lumbar Four-Five, Lumbar Five-Sacral One Laminectomy (N/A)  Patient Location: PACU  Anesthesia Type:General  Level of Consciousness: awake  Airway & Oxygen Therapy: Patient Spontanous Breathing and Patient connected to face mask oxygen  Post-op Assessment: Report given to RN and Post -op Vital signs reviewed and stable  Post vital signs: Reviewed and stable  Last Vitals:  Filed Vitals:   06/14/15 0858  BP: 131/71  Pulse: 85  Temp: 36.7 C  Resp: 18    Complications: No apparent anesthesia complications

## 2015-06-14 NOTE — Anesthesia Procedure Notes (Signed)
Procedure Name: Intubation Date/Time: 06/14/2015 12:39 PM Performed by: Brien Mates D Pre-anesthesia Checklist: Patient identified, Emergency Drugs available, Suction available, Patient being monitored and Timeout performed Patient Re-evaluated:Patient Re-evaluated prior to inductionOxygen Delivery Method: Circle system utilized Preoxygenation: Pre-oxygenation with 100% oxygen Intubation Type: IV induction Ventilation: Mask ventilation without difficulty Laryngoscope Size: Miller and 2 Grade View: Grade I Tube type: Oral Tube size: 7.5 mm Number of attempts: 1 Airway Equipment and Method: Stylet Placement Confirmation: positive ETCO2,  ETT inserted through vocal cords under direct vision and breath sounds checked- equal and bilateral Secured at: 21 cm Tube secured with: Tape Dental Injury: Teeth and Oropharynx as per pre-operative assessment

## 2015-06-15 DIAGNOSIS — M4806 Spinal stenosis, lumbar region: Secondary | ICD-10-CM | POA: Diagnosis not present

## 2015-06-15 MED ORDER — METHOCARBAMOL 500 MG PO TABS: 500.0000 mg | ORAL_TABLET | Freq: Four times a day (QID) | ORAL | Status: DC | PRN

## 2015-06-15 MED ORDER — OXYCODONE HCL 5 MG PO TABS: 5.0000 mg | ORAL_TABLET | ORAL | Status: DC | PRN

## 2015-06-15 NOTE — Progress Notes (Signed)
Patient ID: [name] Good, female   DOB: [birth year], 65 y.o.   MRN: [id number] Vital signs are stable Motor function is intact Patient complains of some back soreness Dressing is dry save for one spot of bleedthrough Ready for discharge Discharge home today

## 2015-06-15 NOTE — Progress Notes (Signed)
Patient alert and oriented, mae's well, voiding adequate amount of urine, swallowing without difficulty, c/o mild pain and ice pack given to reduce the soreness.  Patient discharged home with family. Script and discharged instructions given to patient. Patient and family stated understanding of d/c instructions given and has an appointment with MD.

## 2015-06-15 NOTE — Discharge Summary (Signed)
Physician Discharge Summary  Patient ID: [name] Good MRN: [id number] DOB/AGE: 66-18-1952 66 y.o.  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 She tolerated surgery well. She has some modest back soreness. She is ambulatory.  Consults: None  Significant Diagnostic Studies: None  Treatments: surgery: Laminotomy and foraminotomies L3-4 L4-5 and L5-S1 on the right  Discharge Exam: Blood pressure 115/66, pulse 91, temperature 98.3 F (36.8 C), temperature source Oral, resp. rate 18, height 5' 2.5" (1.588 m), weight 89.994 kg (198 lb 6.4 oz), SpO2 99 %. Incision is clean and dry. Station and gait are intact.  Disposition: Discharge home  Discharge Instructions    Call MD for:  redness, tenderness, or signs of infection (pain, swelling, redness, odor or green/yellow discharge around incision site)    Complete by:  As directed      Call MD for:  severe uncontrolled pain    Complete by:  As directed      Call MD for:  temperature >100.4    Complete by:  As directed      Diet - low sodium heart healthy    Complete by:  As directed      Increase activity slowly    Complete by:  As directed             Medication List    TAKE these medications        calcium carbonate 750 MG chewable tablet  Commonly known as:  TUMS EX  Chew 1 tablet by mouth 2 (two) times daily as needed for heartburn.     ciprofloxacin 500 MG tablet  Commonly known as:  CIPRO  Take 500 mg by mouth 2 (two) times daily.     methocarbamol 500 MG tablet  Commonly known as:  ROBAXIN  Take 1 tablet (500 mg total) by mouth every 6  (six) hours as needed for muscle spasms.     omeprazole 20 MG capsule  Commonly known as:  PRILOSEC  Take 20 mg by mouth daily as needed.     oxyCODONE 5 MG immediate release tablet  Commonly known as:  Oxy IR/ROXICODONE  Take 1 tablet (5 mg total) by mouth every 4 (four) hours as needed for moderate pain.     oxyCODONE-acetaminophen 10-325 MG tablet  Commonly known as:  PERCOCET  Take 1 tablet by mouth every 4 (four) hours as needed for pain.     pseudoephedrine 30 MG tablet  Commonly known as:  SUDAFED  Take 30 mg by mouth daily as needed for congestion.     RELAX & SLEEP PO  Take 1 tablet by mouth at bedtime.     rOPINIRole 0.5 MG tablet  Commonly known as:  REQUIP  Take 0.5 mg by mouth at bedtime. 1-3 hours before bedtime     SALONPAS Pads  Apply 1 each topically daily.     sertraline 100 MG tablet  Commonly known as:  ZOLOFT  Take 100 mg by mouth daily.     sodium chloride 0.65 % Soln nasal spray  Commonly known as:  OCEAN  Place 1 spray into both nostrils as needed for congestion.     Vitamin D (  Cholecalciferol) 400 units Tabs  Take 2 tablets by mouth daily.         SignedStefani Dama 06/15/2015, 8:59 AM

## 2015-06-17 ENCOUNTER — Encounter (HOSPITAL_COMMUNITY): Payer: Self-pay | Admitting: Neurological Surgery

## 2015-07-23 ENCOUNTER — Other Ambulatory Visit: Payer: Self-pay | Admitting: Neurological Surgery

## 2015-07-23 ENCOUNTER — Encounter (HOSPITAL_COMMUNITY): Payer: Self-pay | Admitting: *Deleted

## 2015-07-23 NOTE — Progress Notes (Signed)
Pt denies SOB, chest pain, and being under the care of a cardiologist. Pt denies having a stress test and echo. Pt made aware to stop taking Aspirin, vitamins, fish oil, Sudafed,and herbal medications such as Relax and Sleep natural Products. Do not take any NSAIDs ie: Ibuprofen, Advil, Naproxen or any medication containing Aspirin. Pt verbalized understanding of all pre-op instructions.

## 2015-07-23 NOTE — Progress Notes (Signed)
Spoke with Erie NoeVanessa, Surgical Scheduler, to make MD aware to sign orders.

## 2015-07-24 ENCOUNTER — Ambulatory Visit (HOSPITAL_COMMUNITY)
Admission: RE | Admit: 2015-07-24 | Discharge: 2015-07-24 | Disposition: A | Discharge status: Home / Self Care | Payer: Medicare Other | Source: Ambulatory Visit | Attending: Neurological Surgery | Admitting: Neurological Surgery

## 2015-07-24 ENCOUNTER — Encounter (HOSPITAL_COMMUNITY)
Admission: RE | Disposition: A | Discharge status: Home / Self Care | Payer: Self-pay | Source: Ambulatory Visit | Attending: Neurological Surgery

## 2015-07-24 ENCOUNTER — Encounter (HOSPITAL_COMMUNITY): Payer: Self-pay | Admitting: *Deleted

## 2015-07-24 ENCOUNTER — Ambulatory Visit (HOSPITAL_COMMUNITY): Payer: Medicare Other | Admitting: Anesthesiology

## 2015-07-24 DIAGNOSIS — T8131XA Disruption of external operation (surgical) wound, not elsewhere classified, initial encounter: Secondary | ICD-10-CM | POA: Insufficient documentation

## 2015-07-24 DIAGNOSIS — F329 Major depressive disorder, single episode, unspecified: Secondary | ICD-10-CM | POA: Diagnosis not present

## 2015-07-24 DIAGNOSIS — Y838 Other surgical procedures as the cause of abnormal reaction of the patient, or of later complication, without mention of misadventure at the time of the procedure: Secondary | ICD-10-CM | POA: Insufficient documentation

## 2015-07-24 DIAGNOSIS — Z87891 Personal history of nicotine dependence: Secondary | ICD-10-CM | POA: Diagnosis not present

## 2015-07-24 DIAGNOSIS — I252 Old myocardial infarction: Secondary | ICD-10-CM | POA: Insufficient documentation

## 2015-07-24 HISTORY — PX: LUMBAR WOUND DEBRIDEMENT: SHX1988

## 2015-07-24 HISTORY — DX: Disruption of wound, unspecified, initial encounter: T81.30XA

## 2015-07-24 LAB — CBC
HCT: 39 % (ref 36.0–46.0)
Hemoglobin: 13.4 g/dL (ref 12.0–15.0)
MCH: 31.8 pg (ref 26.0–34.0)
MCHC: 34.4 g/dL (ref 30.0–36.0)
MCV: 92.4 fL (ref 78.0–100.0)
Platelets: 301 10*3/uL (ref 150–400)
RBC: 4.22 MIL/uL (ref 3.87–5.11)
RDW: 12 % (ref 11.5–15.5)
WBC: 12.7 10*3/uL — ABNORMAL HIGH (ref 4.0–10.5)

## 2015-07-24 LAB — BASIC METABOLIC PANEL
Anion gap: 8 (ref 5–15)
BUN: 16 mg/dL (ref 6–20)
CO2: 27 mmol/L (ref 22–32)
Calcium: 9 mg/dL (ref 8.9–10.3)
Chloride: 105 mmol/L (ref 101–111)
Creatinine, Ser: 0.79 mg/dL (ref 0.44–1.00)
GFR calc Af Amer: >60 mL/min (ref >60)
GFR calc non Af Amer: >60 mL/min (ref >60)
Glucose, Bld: 84 mg/dL (ref 65–99)
Potassium: 3.2 mmol/L — ABNORMAL LOW (ref 3.5–5.1)
Sodium: 140 mmol/L (ref 135–145)

## 2015-07-24 SURGERY — LUMBAR WOUND DEBRIDEMENT
Anesthesia: General | Site: Back

## 2015-07-24 MED ORDER — MIDAZOLAM HCL 2 MG/2ML IJ SOLN
INTRAMUSCULAR | Status: AC
  Filled 2015-07-24: qty 2

## 2015-07-24 MED ORDER — MIDAZOLAM HCL 5 MG/5ML IJ SOLN
INTRAMUSCULAR | Status: DC | PRN
  Administered 2015-07-24: 2 mg via INTRAVENOUS

## 2015-07-24 MED ORDER — ONDANSETRON HCL 4 MG/2ML IJ SOLN
INTRAMUSCULAR | Status: AC
  Filled 2015-07-24: qty 2

## 2015-07-24 MED ORDER — ROCURONIUM BROMIDE 100 MG/10ML IV SOLN
INTRAVENOUS | Status: DC | PRN
  Administered 2015-07-24: 40 mg via INTRAVENOUS

## 2015-07-24 MED ORDER — SUGAMMADEX SODIUM 500 MG/5ML IV SOLN
INTRAVENOUS | Status: DC | PRN
  Administered 2015-07-24: 200 mg via INTRAVENOUS

## 2015-07-24 MED ORDER — 0.9 % SODIUM CHLORIDE (POUR BTL) OPTIME
TOPICAL | Status: DC | PRN
  Administered 2015-07-24: 1000 mL

## 2015-07-24 MED ORDER — DEXAMETHASONE SODIUM PHOSPHATE 10 MG/ML IJ SOLN
10.0000 mg | INTRAMUSCULAR | Status: AC
  Administered 2015-07-24: 10 mg via INTRAVENOUS
  Filled 2015-07-24: qty 1

## 2015-07-24 MED ORDER — ROCURONIUM BROMIDE 50 MG/5ML IV SOLN
INTRAVENOUS | Status: AC
  Filled 2015-07-24: qty 1

## 2015-07-24 MED ORDER — SODIUM CHLORIDE 0.9 % IR SOLN
Status: DC | PRN
  Administered 2015-07-24: 16:00:00

## 2015-07-24 MED ORDER — SODIUM CHLORIDE 0.9 % IJ SOLN
INTRAMUSCULAR | Status: AC
  Filled 2015-07-24: qty 10

## 2015-07-24 MED ORDER — BUPIVACAINE HCL (PF) 0.25 % IJ SOLN
INTRAMUSCULAR | Status: DC | PRN
  Administered 2015-07-24: 20 mL

## 2015-07-24 MED ORDER — PROPOFOL 10 MG/ML IV BOLUS
INTRAVENOUS | Status: DC | PRN
  Administered 2015-07-24: 120 mg via INTRAVENOUS

## 2015-07-24 MED ORDER — LACTATED RINGERS IV SOLN
INTRAVENOUS | Status: DC
  Administered 2015-07-24: 15:00:00 via INTRAVENOUS

## 2015-07-24 MED ORDER — SUGAMMADEX SODIUM 500 MG/5ML IV SOLN
INTRAVENOUS | Status: AC
  Filled 2015-07-24: qty 5

## 2015-07-24 MED ORDER — PROPOFOL 10 MG/ML IV BOLUS
INTRAVENOUS | Status: AC
  Filled 2015-07-24: qty 20

## 2015-07-24 MED ORDER — MORPHINE SULFATE (PF) 2 MG/ML IV SOLN: 1.0000 mg | INTRAVENOUS | Status: DC | PRN

## 2015-07-24 MED ORDER — PHENYLEPHRINE HCL 10 MG/ML IJ SOLN
INTRAMUSCULAR | Status: DC | PRN
  Administered 2015-07-24: 80 ug via INTRAVENOUS

## 2015-07-24 MED ORDER — LIDOCAINE HCL (CARDIAC) 20 MG/ML IV SOLN
INTRAVENOUS | Status: DC | PRN
  Administered 2015-07-24: 100 mg via INTRAVENOUS

## 2015-07-24 MED ORDER — SUFENTANIL CITRATE 50 MCG/ML IV SOLN
INTRAVENOUS | Status: AC
  Filled 2015-07-24: qty 1

## 2015-07-24 MED ORDER — ONDANSETRON HCL 4 MG/2ML IJ SOLN
INTRAMUSCULAR | Status: DC | PRN
  Administered 2015-07-24: 4 mg via INTRAVENOUS

## 2015-07-24 MED ORDER — VANCOMYCIN HCL IN DEXTROSE 1-5 GM/200ML-% IV SOLN: 1000.0000 mg | INTRAVENOUS | Status: DC

## 2015-07-24 MED ORDER — FENTANYL CITRATE (PF) 100 MCG/2ML IJ SOLN
25.0000 ug | INTRAMUSCULAR | Status: DC | PRN
  Administered 2015-07-24: 50 ug via INTRAVENOUS

## 2015-07-24 MED ORDER — VANCOMYCIN HCL IN DEXTROSE 1-5 GM/200ML-% IV SOLN
INTRAVENOUS | Status: AC
  Administered 2015-07-24: 1000 mg via INTRAVENOUS
  Filled 2015-07-24: qty 200

## 2015-07-24 MED ORDER — SUFENTANIL CITRATE 50 MCG/ML IV SOLN
INTRAVENOUS | Status: DC | PRN
  Administered 2015-07-24: 10 ug via INTRAVENOUS

## 2015-07-24 MED ORDER — FENTANYL CITRATE (PF) 100 MCG/2ML IJ SOLN
INTRAMUSCULAR | Status: AC
  Filled 2015-07-24: qty 2

## 2015-07-24 SURGICAL SUPPLY — 46 items
ADH SKN CLS APL DERMABOND .7 (GAUZE/BANDAGES/DRESSINGS) ×1
APL SKNCLS STERI-STRIP NONHPOA (GAUZE/BANDAGES/DRESSINGS) ×1
BAG DECANTER FOR FLEXI CONT (MISCELLANEOUS) ×3 IMPLANT
BENZOIN TINCTURE PRP APPL 2/3 (GAUZE/BANDAGES/DRESSINGS) ×2 IMPLANT
CANISTER SUCT 3000ML PPV (MISCELLANEOUS) ×2 IMPLANT
DERMABOND ADVANCED (GAUZE/BANDAGES/DRESSINGS) ×1
DERMABOND ADVANCED .7 DNX12 (GAUZE/BANDAGES/DRESSINGS) IMPLANT
DRAPE LAPAROTOMY 100X72X124 (DRAPES) ×2 IMPLANT
DRAPE POUCH INSTRU U-SHP 10X18 (DRAPES) ×2 IMPLANT
DRSG OPSITE POSTOP 4X6 (GAUZE/BANDAGES/DRESSINGS) ×1 IMPLANT
DURAPREP 26ML APPLICATOR (WOUND CARE) ×1 IMPLANT
DURAPREP 6ML APPLICATOR 50/CS (WOUND CARE) IMPLANT
ELECT REM PT RETURN 9FT ADLT (ELECTROSURGICAL) ×2
ELECTRODE REM PT RTRN 9FT ADLT (ELECTROSURGICAL) ×1 IMPLANT
GAUZE SPONGE 4X4 16PLY XRAY LF (GAUZE/BANDAGES/DRESSINGS) IMPLANT
GLOVE BIO SURGEON STRL SZ8 (GLOVE) ×2 IMPLANT
GLOVE BIOGEL PI IND STRL 8 (GLOVE) IMPLANT
GLOVE BIOGEL PI INDICATOR 8 (GLOVE) ×1
GLOVE ECLIPSE 7.5 STRL STRAW (GLOVE) ×4 IMPLANT
GOWN STRL REUS W/ TWL LRG LVL3 (GOWN DISPOSABLE) IMPLANT
GOWN STRL REUS W/ TWL XL LVL3 (GOWN DISPOSABLE) ×1 IMPLANT
GOWN STRL REUS W/TWL 2XL LVL3 (GOWN DISPOSABLE) ×2 IMPLANT
GOWN STRL REUS W/TWL LRG LVL3 (GOWN DISPOSABLE)
GOWN STRL REUS W/TWL XL LVL3 (GOWN DISPOSABLE) ×2
KIT BASIN OR (CUSTOM PROCEDURE TRAY) ×2 IMPLANT
KIT ROOM TURNOVER OR (KITS) ×2 IMPLANT
NDL HYPO 18GX1.5 BLUNT FILL (NEEDLE) IMPLANT
NDL HYPO 25X1 1.5 SAFETY (NEEDLE) ×1 IMPLANT
NDL SPNL 20GX3.5 QUINCKE YW (NEEDLE) IMPLANT
NEEDLE HYPO 18GX1.5 BLUNT FILL (NEEDLE) IMPLANT
NEEDLE HYPO 25X1 1.5 SAFETY (NEEDLE) ×2 IMPLANT
NEEDLE SPNL 20GX3.5 QUINCKE YW (NEEDLE) IMPLANT
NS IRRIG 1000ML POUR BTL (IV SOLUTION) ×2 IMPLANT
PACK LAMINECTOMY NEURO (CUSTOM PROCEDURE TRAY) ×2 IMPLANT
PAD ARMBOARD 7.5X6 YLW CONV (MISCELLANEOUS) ×6 IMPLANT
STRIP CLOSURE SKIN 1/2X4 (GAUZE/BANDAGES/DRESSINGS) ×2 IMPLANT
SUT VIC AB 0 CT1 18XCR BRD8 (SUTURE) ×1 IMPLANT
SUT VIC AB 0 CT1 8-18 (SUTURE) ×2
SUT VIC AB 2-0 CP2 18 (SUTURE) ×2 IMPLANT
SUT VIC AB 3-0 SH 8-18 (SUTURE) ×2 IMPLANT
SWAB COLLECTION DEVICE MRSA (MISCELLANEOUS) ×1 IMPLANT
SYR 3ML LL SCALE MARK (SYRINGE) IMPLANT
TOWEL OR 17X24 6PK STRL BLUE (TOWEL DISPOSABLE) ×2 IMPLANT
TOWEL OR 17X26 10 PK STRL BLUE (TOWEL DISPOSABLE) ×2 IMPLANT
TUBE ANAEROBIC SPECIMEN COL (MISCELLANEOUS) ×1 IMPLANT
WATER STERILE IRR 1000ML POUR (IV SOLUTION) ×2 IMPLANT

## 2015-07-24 NOTE — Anesthesia Postprocedure Evaluation (Signed)
Anesthesia Post Note  Patient: Octaviano GlowBrenda J Cosner  Procedure(s) Performed: Procedure(s) (LRB): lumbar wound revision (N/A)  Patient location during evaluation: PACU Anesthesia Type: General Level of consciousness: awake and alert Pain management: pain level controlled Vital Signs Assessment: post-procedure vital signs reviewed and stable Respiratory status: spontaneous breathing, nonlabored ventilation, respiratory function stable and patient connected to nasal cannula oxygen Cardiovascular status: blood pressure returned to baseline and stable Postop Assessment: no signs of nausea or vomiting Anesthetic complications: no    Last Vitals:  Filed Vitals:   07/24/15 1650 07/24/15 1655  BP: 128/73   Pulse: 84 86  Temp:    Resp: 15 18    Last Pain:  Filed Vitals:   07/24/15 1656  PainSc: 7                  Phillips Groutarignan, Amarria Andreasen

## 2015-07-24 NOTE — Anesthesia Procedure Notes (Signed)
Procedure Name: Intubation Date/Time: 07/24/2015 3:31 PM Performed by: Charm BargesBUTLER, Kisa Fujii R Pre-anesthesia Checklist: Patient identified, Emergency Drugs available, Suction available and Patient being monitored Patient Re-evaluated:Patient Re-evaluated prior to inductionOxygen Delivery Method: Circle System Utilized Preoxygenation: Pre-oxygenation with 100% oxygen Intubation Type: IV induction Ventilation: Mask ventilation without difficulty Laryngoscope Size: Mac and 3 Grade View: Grade I Tube type: Oral Tube size: 7.5 mm Number of attempts: 1 Airway Equipment and Method: Stylet and Oral airway Placement Confirmation: ETT inserted through vocal cords under direct vision,  positive ETCO2 and breath sounds checked- equal and bilateral Tube secured with: Tape Dental Injury: Teeth and Oropharynx as per pre-operative assessment

## 2015-07-24 NOTE — Anesthesia Preprocedure Evaluation (Addendum)
Anesthesia Evaluation  Patient identified by MRN, date of birth, ID band Patient awake    Reviewed: Allergy & Precautions, H&P , Patient's Chart, lab work & pertinent test results, reviewed documented beta blocker date and time   Airway Mallampati: II  TM Distance: >3 FB Neck ROM: full    Dental no notable dental hx.    Pulmonary former smoker,    Pulmonary exam normal breath sounds clear to auscultation       Cardiovascular  Rhythm:regular Rate:Normal     Neuro/Psych    GI/Hepatic   Endo/Other    Renal/GU      Musculoskeletal   Abdominal   Peds  Hematology   Anesthesia Other Findings   Reproductive/Obstetrics                            Anesthesia Physical Anesthesia Plan  ASA: III  Anesthesia Plan: General   Post-op Pain Management:    Induction: Intravenous  Airway Management Planned: Oral ETT  Additional Equipment:   Intra-op Plan:   Post-operative Plan: Extubation in OR  Informed Consent: I have reviewed the patients History and Physical, chart, labs and discussed the procedure including the risks, benefits and alternatives for the proposed anesthesia with the patient or authorized representative who has indicated his/her understanding and acceptance.   Dental Advisory Given and Dental advisory given  Plan Discussed with: CRNA and Surgeon  Anesthesia Plan Comments: (  Discussed general anesthesia, including possible nausea, instrumentation of airway, sore throat,pulmonary aspiration, etc. I asked if the were any outstanding questions, or  concerns before we proceeded. )        Anesthesia Quick Evaluation                                  Anesthesia Evaluation  Patient identified by MRN, date of birth, ID band Patient awake    Reviewed: Allergy & Precautions, H&P , NPO status , Patient's Chart, lab work & pertinent test results  History of Anesthesia  Complications Negative for: history of anesthetic complications  Airway Mallampati: II  TM Distance: >3 FB Neck ROM: full    Dental  (+) Edentulous Upper, Edentulous Lower   Pulmonary former smoker,    Pulmonary exam normal breath sounds clear to auscultation       Cardiovascular + Past MI  Normal cardiovascular exam Rhythm:regular Rate:Normal  Questionable MI in 1990, 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  Denies signs or symptoms of angina or heart failure currently  Her RCRI is < 1% so we will proceed with elective non-cardiac surgery per 2014 ACC/AHA guidelines   Neuro/Psych Depression negative neurological ROS     GI/Hepatic negative GI ROS, Neg liver ROS,   Endo/Other  Morbid obesity  Renal/GU negative Renal ROS     Musculoskeletal  (+) Arthritis ,   Abdominal   Peds  Hematology negative hematology ROS (+)   Anesthesia Other Findings   Reproductive/Obstetrics negative OB ROS                           Anesthesia Physical Anesthesia Plan  ASA: II  Anesthesia Plan: General   Post-op Pain Management:    Induction: Intravenous  Airway Management Planned: Oral ETT  Additional Equipment: None  Intra-op Plan:   Post-operative Plan: Extubation in OR  Informed Consent: I  have reviewed the patients History and Physical, chart, labs and discussed the procedure including the risks, benefits and alternatives for the proposed anesthesia with the patient or authorized representative who has indicated his/her understanding and acceptance.   Dental Advisory Given and Dental advisory given  Plan Discussed with: Anesthesiologist, CRNA and Surgeon  Anesthesia Plan Comments:        Anesthesia Quick Evaluation

## 2015-07-24 NOTE — Op Note (Signed)
07/24/2015  4:09 PM  PATIENT:  [name] Good  65 y.o. female  PRE-OPERATIVE DIAGNOSIS:  Superficial wound dehiscence  POST-OPERATIVE DIAGNOSIS:  Same  PROCEDURE:  Debridement and revision of superficial wound dehiscence with primary closure  SURGEON:  Marikay Alaravid Oren Barella, MD  ASSISTANTS: None  ANESTHESIA:   General  EBL: Minimal ml     BLOOD ADMINISTERED:none  DRAINS: None   SPECIMEN:  No Specimen  INDICATION FOR PROCEDURE: This patient presented 6 weeks after multilevel lumbar decompressive laminectomy with a superficial wound dehiscence without obvious evidence of infection. I recommended wound revision as I thought this would heal better and faster than a secondary closure with dressing changes and I thought it would reduce the risk of secondary infection. Patient understood the risks, benefits, and alternatives and potential outcomes and wished to proceed.  PROCEDURE DETAILS: The patient was taken to the operating room and after induction of adequate generalized endotracheal anesthesia, the patient was rolled into the prone position on the Wilson frame and all pressure points were padded. The lumbar region was cleaned and then prepped with DuraPrep and draped in the usual sterile fashion. I ellipsed out the old incision and carried my sharp dissection down about 2 inches deep into the subcutaneous tissues. The wound was about 4 inches long. There was no evidence of infection. The fascia looked closed. I irrigated with saline solution containing bacitracin. Achieved hemostasis with bipolar cautery, and then closed the fascia with 0 Vicryl. I closed the subcutaneous tissues with 2-0 Vicryl and the subcuticular tissues with 3-0 Vicryl. The skin was then closed with benzoin and Steri-Strips. The drapes were removed, a sterile dressing was applied. The patient was awakened from general anesthesia and transferred to the recovery room in stable condition. At the end of the procedure all sponge,  needle and instrument counts were correct.   PLAN OF CARE: Discharge to home after PACU  PATIENT DISPOSITION:  PACU - hemodynamically stable.   Delay start of Pharmacological VTE agent (>24hrs) due to surgical blood loss or risk of bleeding:  yes

## 2015-07-24 NOTE — H&P (Signed)
Subjective: Patient is a 65 y.o. female admitted for lumbar wound revision. Onset of symptoms was several days ago, stable since that time.  The pain is rated moderate, and is located at the across the lower back. The pain is described as aching and occurs intermittently. The symptoms have been progressive. Symptoms are exacerbated by exercise. She is 6 weeks status post re-level lumbar laminectomy. She presented to the office this week for follow-up and I found her to have a minor superficial dehiscence of the wound. I felt that it would heal quicker with primary revision rather than healing with dressing changes and secondary intention.   Past Medical History  Diagnosis Date  . Myocardial infarction (HCC) 89    no visits to cardiac dr(thomas kelly) since 92  . Depression   . Arthritis   . Wound dehiscence     lumbar    Past Surgical History  Procedure Laterality Date  . Cardiac catheterization  89  . Breast surgery Left     cyst  . Cholecystectomy      65 yrs old  . Tubal ligation    . Appendectomy      17 yrs  . Lumbar laminectomy/decompression microdiscectomy N/A 06/14/2015    Procedure: Lumbar Three-Four,Lumbar Four-Five, Lumbar Five-Sacral One Laminectomy;  Surgeon: Tia Alertavid S Christobal Morado, MD;  Location: [facility];  Service: Neurosurgery;  Laterality: N/A;    Prior to Admission medications   Medication Sig Start Date End Date Taking? Authorizing Provider  calcium carbonate (TUMS EX) 750 MG chewable tablet Chew 1 tablet by mouth 2 (two) times daily as needed for heartburn.   Yes Historical Provider, MD  Liniments Chinita Pester(SALONPAS) PADS Apply 1 each topically daily.   Yes Historical Provider, MD  methocarbamol (ROBAXIN) 500 MG tablet Take 1 tablet (500 mg total) by mouth every 6 (six) hours as needed for muscle spasms. 06/15/15  Yes Barnett AbuHenry Elsner, MD  Misc Natural Products (RELAX & SLEEP PO) Take 1 tablet by mouth at bedtime.   Yes Historical Provider, MD  omeprazole (PRILOSEC) 20 MG capsule  Take 20 mg by mouth daily as needed (for acid reflux).    Yes Historical Provider, MD  oxyCODONE-acetaminophen (PERCOCET) 10-325 MG tablet Take 1 tablet by mouth every 4 (four) hours as needed for pain.   Yes Historical Provider, MD  pseudoephedrine (SUDAFED) 30 MG tablet Take 30 mg by mouth daily as needed for congestion.   Yes Historical Provider, MD  rOPINIRole (REQUIP) 1 MG tablet Take 1 mg by mouth at bedtime.   Yes Historical Provider, MD  sertraline (ZOLOFT) 100 MG tablet Take 100 mg by mouth daily.   Yes Historical Provider, MD  sodium chloride (OCEAN) 0.65 % SOLN nasal spray Place 1 spray into both nostrils as needed for congestion.   Yes Historical Provider, MD  Vitamin D, Cholecalciferol, 400 units TABS Take 800 Units by mouth daily.    Yes Historical Provider, MD  oxyCODONE (OXY IR/ROXICODONE) 5 MG immediate release tablet Take 1 tablet (5 mg total) by mouth every 4 (four) hours as needed for moderate pain. Patient not taking: Reported on 07/23/2015 06/15/15   Barnett AbuHenry Elsner, MD   Allergies  Allergen Reactions  . Amoxicillin Other (See Comments)    Unknown reaction Pt states it made her feel very sick    Social History  Substance Use Topics  . Smoking status: Former Smoker -- 2.00 packs/day for 40 years    Types: Cigarettes    Quit date: 04/10/2013  . Smokeless  tobacco: Never Used  . Alcohol Use: No    History reviewed. No pertinent family history.   Review of Systems  Positive ROS: neg  All other systems have been reviewed and were otherwise negative with the exception of those mentioned in the HPI and as above.  Objective: Vital signs in last 24 hours: Temp:  [98.6 F (37 C)] 98.6 F (37 C) (03/29 1326) Pulse Rate:  [82] 82 (03/29 1326) Resp:  [20] 20 (03/29 1326) BP: (130)/(54) 130/54 mmHg (03/29 1326) SpO2:  [98 %] 98 % (03/29 1326) Weight:  [90.719 kg (200 lb)] 90.719 kg (200 lb) (03/29 1326)  General Appearance: Alert, cooperative, no distress, appears stated  age Head: Normocephalic, without obvious abnormality, atraumatic Eyes: PERRL, conjunctiva/corneas clear, EOM's intact    Neck: Supple, symmetrical, trachea midline Back: Symmetric, no curvature, ROM normal, no CVA tenderness Lungs:  respirations unlabored Heart: Regular rate and rhythm Abdomen: Soft, non-tender Extremities: Extremities normal, atraumatic, no cyanosis or edema Pulses: 2+ and symmetric all extremities Skin: Skin color, texture, turgor normal, no rashes or lesions  NEUROLOGIC:   Mental status: Alert and oriented x4,  no aphasia, good attention span, fund of knowledge, and memory Motor Exam - grossly normal Sensory Exam - grossly normal Reflexes: 1+ Coordination - grossly normal Gait - grossly normal Balance - grossly normal Cranial Nerves: I: smell Not tested  II: visual acuity  OS: nl    OD: nl  II: visual fields Full to confrontation  II: pupils Equal, round, reactive to light  III,VII: ptosis None  III,IV,VI: extraocular muscles  Full ROM  V: mastication Normal  V: facial light touch sensation  Normal  V,VII: corneal reflex  Present  VII: facial muscle function - upper  Normal  VII: facial muscle function - lower Normal  VIII: hearing Not tested  IX: soft palate elevation  Normal  IX,X: gag reflex Present  XI: trapezius strength  5/5  XI: sternocleidomastoid strength 5/5  XI: neck flexion strength  5/5  XII: tongue strength  Normal    Data Review Lab Results  Component Value Date   WBC 12.7* 07/24/2015   HGB 13.4 07/24/2015   HCT 39.0 07/24/2015   MCV 92.4 07/24/2015   PLT 301 07/24/2015   Lab Results  Component Value Date   NA 140 07/24/2015   K 3.2* 07/24/2015   CL 105 07/24/2015   CO2 27 07/24/2015   BUN 16 07/24/2015   CREATININE 0.79 07/24/2015   GLUCOSE 84 07/24/2015   No results found for: INR, PROTIME  Assessment/Plan: Patient admitted for wound revision. Patient has failed a reasonable attempt at conservative therapy.  I  explained the condition and procedure to the patient and answered any questions.  Patient wishes to proceed with procedure as planned. Understands risks/ benefits and typical outcomes of procedure.   Maika Mcelveen S 07/24/2015 3:23 PM

## 2015-07-24 NOTE — Transfer of Care (Signed)
Immediate Anesthesia Transfer of Care Note  Patient: [name] Good  Procedure(s) Performed: Procedure(s): lumbar wound revision (N/A)  Patient Location: PACU  Anesthesia Type:General  Level of Consciousness: awake, oriented and patient cooperative  Airway & Oxygen Therapy: Patient Spontanous Breathing and Patient connected to nasal cannula oxygen  Post-op Assessment: Report given to RN, Post -op Vital signs reviewed and stable and Patient moving all extremities  Post vital signs: Reviewed and stable  Last Vitals:  Filed Vitals:   07/24/15 1326  BP: 130/54  Pulse: 82  Temp: 37 C  Resp: 20    Complications: No apparent anesthesia complications

## 2015-07-25 ENCOUNTER — Encounter (HOSPITAL_COMMUNITY): Payer: Self-pay | Admitting: Neurological Surgery

## 2015-10-08 ENCOUNTER — Other Ambulatory Visit: Payer: Self-pay | Admitting: Physician Assistant

## 2015-10-16 NOTE — Patient Instructions (Signed)
[name] Good  10/16/2015   Your procedure is scheduled on: Friday 10/25/2015  [facility]  [place]  [place] to 3rd floor to  Short Stay Center at   1030 AM.  Call this number if you have problems the morning of surgery [phone]   Remember: ONLY 1 PERSON MAY GO WITH YOU TO SHORT STAY TO GET  READY MORNING OF YOUR SURGERY.   Do not eat food or drink liquids :After Midnight.     Take these medicines the morning of surgery with A SIP OF WATER: Sertraline                                 You may not have any metal on your body including hair pins and              piercings  Do not wear jewelry, make-up, lotions, powders or perfumes, deodorant             Do not wear nail polish.  Do not shave  48 hours prior to surgery.            .   Do not bring valuables to the hospital. [place] IS NOT             RESPONSIBLE   FOR VALUABLES.  Contacts, dentures or bridgework may not be worn into surgery.  Leave suitcase in the car. After surgery it may be brought to your room.                 Please read over the following fact sheets you were given: _____________________________________________________________________             [facility] - Preparing for Surgery Before surgery, you can play an important role.  Because skin is not sterile, your skin needs to be as free of germs as possible.  You can reduce the number of germs on your skin by washing with CHG (chlorahexidine gluconate) soap before surgery.  CHG is an antiseptic cleaner which kills germs and bonds with the skin to continue killing germs even after washing. Please DO NOT use if you have an allergy to CHG or antibacterial soaps.  If your skin becomes reddened/irritated stop using the CHG and inform your nurse when you arrive at Short Stay. Do not shave (including legs and underarms) for at least 48 hours prior to the first CHG shower.  You may shave your  face/neck. Please follow these instructions carefully:  1.  Shower with CHG Soap the night before surgery and the  morning of Surgery.  2.  If you choose to wash your hair, wash your hair first as usual with your  normal  shampoo.  3.  After you shampoo, rinse your hair and body thoroughly to remove the  shampoo.                           4.  Use CHG as you would any other liquid soap.  You can apply chg directly  to the skin and wash                       Gently with a scrungie or clean washcloth.  5.  Apply the CHG Soap to your body  ONLY FROM THE NECK DOWN.   Do not use on face/ open                           Wound or open sores. Avoid contact with eyes, ears mouth and genitals (private parts).                       Wash face,  Genitals (private parts) with your normal soap.             6.  Wash thoroughly, paying special attention to the area where your surgery  will be performed.  7.  Thoroughly rinse your body with warm water from the neck down.  8.  DO NOT shower/wash with your normal soap after using and rinsing off  the CHG Soap.                9.  Pat yourself dry with a clean towel.            10.  Wear clean pajamas.            11.  Place clean sheets on your bed the night of your first shower and do not  sleep with pets. Day of Surgery : Do not apply any lotions/deodorants the morning of surgery.  Please wear clean clothes to the hospital/surgery center.  FAILURE TO FOLLOW THESE INSTRUCTIONS MAY RESULT IN THE CANCELLATION OF YOUR SURGERY PATIENT SIGNATURE_________________________________  NURSE SIGNATURE__________________________________  ________________________________________________________________________   Adam Phenix  An incentive spirometer is a tool that can help keep your lungs clear and active. This tool measures how well you are filling your lungs with each breath. Taking long deep breaths may help reverse or decrease the chance of developing breathing  (pulmonary) problems (especially infection) following:  A long period of time when you are unable to move or be active. BEFORE THE PROCEDURE   If the spirometer includes an indicator to show your best effort, your nurse or respiratory therapist will set it to a desired goal.  If possible, sit up straight or lean slightly forward. Try not to slouch.  Hold the incentive spirometer in an upright position. INSTRUCTIONS FOR USE   Sit on the edge of your bed if possible, or sit up as far as you can in bed or on a chair.  Hold the incentive spirometer in an upright position.  Breathe out normally.  Place the mouthpiece in your mouth and seal your lips tightly around it.  Breathe in slowly and as deeply as possible, raising the piston or the ball toward the top of the column.  Hold your breath for 3-5 seconds or for as long as possible. Allow the piston or ball to fall to the bottom of the column.  Remove the mouthpiece from your mouth and breathe out normally.  Rest for a few seconds and repeat Steps 1 through 7 at least 10 times every 1-2 hours when you are awake. Take your time and take a few normal breaths between deep breaths.  The spirometer may include an indicator to show your best effort. Use the indicator as a goal to work toward during each repetition.  After each set of 10 deep breaths, practice coughing to be sure your lungs are clear. If you have an incision (the cut made at the time of surgery), support your incision when coughing by placing a pillow or rolled up towels firmly against it. Once  you are able to get out of bed, walk around indoors and cough well. You may stop using the incentive spirometer when instructed by your caregiver.  RISKS AND COMPLICATIONS  Take your time so you do not get dizzy or light-headed.  If you are in pain, you may need to take or ask for pain medication before doing incentive spirometry. It is harder to take a deep breath if you are having  pain. AFTER USE  Rest and breathe slowly and easily.  It can be helpful to keep track of a log of your progress. Your caregiver can provide you with a simple table to help with this. If you are using the spirometer at home, follow these instructions: [place] IF:   You are having difficultly using the spirometer.  You have trouble using the spirometer as often as instructed.  Your pain medication is not giving enough relief while using the spirometer.  You develop fever of 100.5 F (38.1 C) or higher. SEEK IMMEDIATE MEDICAL CARE IF:   You cough up bloody sputum that had not been present before.  You develop fever of 102 F (38.9 C) or greater.  You develop worsening pain at or near the incision site. MAKE SURE YOU:   Understand these instructions.  Will watch your condition.  Will get help right away if you are not doing well or get worse. Document Released: 08/24/2006 Document Revised: 07/06/2011 Document Reviewed: 10/25/2006 Exit[employer] Patient Information 2014 Exit[employer], [place].   ________________________________________________________________________  WHAT IS A BLOOD TRANSFUSION? Blood Transfusion Information  A transfusion is the replacement of blood or some of its parts. Blood is made up of multiple cells which provide different functions.  Red blood cells carry oxygen and are used for blood loss replacement.  White blood cells fight against infection.  Platelets control bleeding.  Plasma helps clot blood.  Other blood products are available for specialized needs, such as hemophilia or other clotting disorders. BEFORE THE TRANSFUSION  Who gives blood for transfusions?   Healthy volunteers who are fully evaluated to make sure their blood is safe. This is blood bank blood. Transfusion therapy is the safest it has ever been in the practice of medicine. Before blood is taken from a donor, a complete history is taken to make sure that person has no history  of diseases nor engages in risky social behavior (examples are intravenous drug use or sexual activity with multiple partners). The donor's travel history is screened to minimize risk of transmitting infections, such as malaria. The donated blood is tested for signs of infectious diseases, such as HIV and hepatitis. The blood is then tested to be sure it is compatible with you in order to minimize the chance of a transfusion reaction. If you or a relative donates blood, this is often done in anticipation of surgery and is not appropriate for emergency situations. It takes many days to process the donated blood. RISKS AND COMPLICATIONS Although transfusion therapy is very safe and saves many lives, the main dangers of transfusion include:   Getting an infectious disease.  Developing a transfusion reaction. This is an allergic reaction to something in the blood you were given. Every precaution is taken to prevent this. The decision to have a blood transfusion has been considered carefully by your caregiver before blood is given. Blood is not given unless the benefits outweigh the risks. AFTER THE TRANSFUSION  Right after receiving a blood transfusion, you will usually feel much better and more energetic. This  is especially true if your red blood cells have gotten low (anemic). The transfusion raises the level of the red blood cells which carry oxygen, and this usually causes an energy increase.  The nurse administering the transfusion will monitor you carefully for complications. HOME CARE INSTRUCTIONS  No special instructions are needed after a transfusion. You may find your energy is better. Speak with your caregiver about any limitations on activity for underlying diseases you may have. SEEK MEDICAL CARE IF:   Your condition is not improving after your transfusion.  You develop redness or irritation at the intravenous (IV) site. SEEK IMMEDIATE MEDICAL CARE IF:  Any of the following symptoms  occur over the next 12 hours:  Shaking chills.  You have a temperature by mouth above 102 F (38.9 C), not controlled by medicine.  Chest, back, or muscle pain.  People around you feel you are not acting correctly or are confused.  Shortness of breath or difficulty breathing.  Dizziness and fainting.  You get a rash or develop hives.  You have a decrease in urine output.  Your urine turns a dark color or changes to pink, red, or brown. Any of the following symptoms occur over the next 10 days:  You have a temperature by mouth above 102 F (38.9 C), not controlled by medicine.  Shortness of breath.  Weakness after normal activity.  The white part of the eye turns yellow (jaundice).  You have a decrease in the amount of urine or are urinating less often.  Your urine turns a dark color or changes to pink, red, or brown. Document Released: 04/10/2000 Document Revised: 07/06/2011 Document Reviewed: 11/28/2007 [facility] Patient Information 2014 [place], [place].  _______________________________________________________________________

## 2015-10-17 ENCOUNTER — Inpatient Hospital Stay (HOSPITAL_COMMUNITY)
Admission: RE | Admit: 2015-10-17 | Discharge: 2015-10-17 | Disposition: A | Discharge status: Home / Self Care | Payer: Medicare Other | Source: Ambulatory Visit

## 2015-10-18 ENCOUNTER — Encounter (HOSPITAL_COMMUNITY): Payer: Self-pay

## 2015-10-18 ENCOUNTER — Encounter (HOSPITAL_COMMUNITY)
Admission: RE | Admit: 2015-10-18 | Discharge: 2015-10-18 | Disposition: A | Discharge status: Home / Self Care | Payer: Medicare Other | Source: Ambulatory Visit | Attending: Orthopaedic Surgery | Admitting: Orthopaedic Surgery

## 2015-10-18 DIAGNOSIS — Z01812 Encounter for preprocedural laboratory examination: Secondary | ICD-10-CM | POA: Diagnosis not present

## 2015-10-18 DIAGNOSIS — Z0183 Encounter for blood typing: Secondary | ICD-10-CM | POA: Insufficient documentation

## 2015-10-18 DIAGNOSIS — M1611 Unilateral primary osteoarthritis, right hip: Secondary | ICD-10-CM | POA: Insufficient documentation

## 2015-10-18 HISTORY — DX: Gastro-esophageal reflux disease without esophagitis: K21.9

## 2015-10-18 LAB — BASIC METABOLIC PANEL
Anion gap: 5 (ref 5–15)
BUN: 17 mg/dL (ref 6–20)
CO2: 28 mmol/L (ref 22–32)
Calcium: 9.4 mg/dL (ref 8.9–10.3)
Chloride: 107 mmol/L (ref 101–111)
Creatinine, Ser: 0.75 mg/dL (ref 0.44–1.00)
GFR calc Af Amer: >60 mL/min (ref >60)
GFR calc non Af Amer: >60 mL/min (ref >60)
Glucose, Bld: 117 mg/dL — ABNORMAL HIGH (ref 65–99)
Potassium: 4.4 mmol/L (ref 3.5–5.1)
Sodium: 140 mmol/L (ref 135–145)

## 2015-10-18 LAB — CBC
HCT: 38.3 % (ref 36.0–46.0)
Hemoglobin: 13.4 g/dL (ref 12.0–15.0)
MCH: 31.5 pg (ref 26.0–34.0)
MCHC: 35 g/dL (ref 30.0–36.0)
MCV: 90.1 fL (ref 78.0–100.0)
Platelets: 258 10*3/uL (ref 150–400)
RBC: 4.25 MIL/uL (ref 3.87–5.11)
RDW: 13 % (ref 11.5–15.5)
WBC: 8 10*3/uL (ref 4.0–10.5)

## 2015-10-18 LAB — ABO/RH: ABO/RH(D): A POS

## 2015-10-18 LAB — SURGICAL PCR SCREEN
MRSA, PCR: NEGATIVE
Staphylococcus aureus: NEGATIVE

## 2015-10-18 NOTE — Progress Notes (Signed)
06-12-15 - EKG - EPIC

## 2015-10-18 NOTE — Progress Notes (Signed)
10-18-15 - Talked to anesthesia regarding pts. History of heart attack in 1991.  No further instructions given.

## 2015-10-18 NOTE — Patient Instructions (Addendum)
[name] Good  10/18/2015   Your procedure is scheduled on: June 30 , 2017  [facility]  [facility] 3[facility] floor to  Short Stay Center at 10:30 AM.  Call this number if you have problems the morning of surgery [phone]   Remember: ONLY 1 PERSON MAY GO WITH YOU TO SHORT STAY TO GET  READY MORNING OF YOUR SURGERY.  Do not eat food or drink liquids :After Midnight.      Take these medicines the morning of surgery with A SIP OF WATER: Sertraline (Zoloft), Prilosec if needed, Oxycodone if needed DO NOT TAKE ANY DIABETIC MEDICATIONS DAY OF YOUR SURGERY                               You may not have any metal on your body including hair pins and              piercings  Do not wear jewelry, make-up, lotions, powders or perfumes, deodorant             Do not wear nail polish.  Do not shave  48 hours prior to surgery.             Do not bring valuables to the hospital. [place] IS NOT             RESPONSIBLE   FOR VALUABLES.  Contacts, dentures or bridgework may not be worn into surgery.  Leave suitcase in the car. After surgery it may be brought to your room.        Special Instructions: coughing and deep breathing exercises. Leg exercises              Please read over the following fact sheets you were given: _____________________________________________________________________             [facility] - Preparing for Surgery Before surgery, you can play an important role.  Because skin is not sterile, your skin needs to be as free of germs as possible.  You can reduce the number of germs on your skin by washing with CHG (chlorahexidine gluconate) soap before surgery.  CHG is an antiseptic cleaner which kills germs and bonds with the skin to continue killing germs even after washing. Please DO NOT use if you have an allergy to CHG or antibacterial soaps.  If your skin becomes reddened/irritated stop using the CHG and  inform your nurse when you arrive at Short Stay. Do not shave (including legs and underarms) for at least 48 hours prior to the first CHG shower.  You may shave your face/neck. Please follow these instructions carefully:  1.  Shower with CHG Soap the night before surgery and the  morning of Surgery.  2.  If you choose to wash your hair, wash your hair first as usual with your  normal  shampoo.  3.  After you shampoo, rinse your hair and body thoroughly to remove the  shampoo.                           4.  Use CHG as you would any other liquid soap.  You can apply chg directly  to the skin and wash  Gently with a scrungie or clean washcloth.  5.  Apply the CHG Soap to your body ONLY FROM THE NECK DOWN.   Do not use on face/ open                           Wound or open sores. Avoid contact with eyes, ears mouth and genitals (private parts).                       Wash face,  Genitals (private parts) with your normal soap.             6.  Wash thoroughly, paying special attention to the area where your surgery  will be performed.  7.  Thoroughly rinse your body with warm water from the neck down.  8.  DO NOT shower/wash with your normal soap after using and rinsing off  the CHG Soap.                9.  Pat yourself dry with a clean towel.            10.  Wear clean pajamas.            11.  Place clean sheets on your bed the night of your first shower and do not  sleep with pets. Day of Surgery : Do not apply any lotions/deodorants the morning of surgery.  Please wear clean clothes to the hospital/surgery center.  FAILURE TO FOLLOW THESE INSTRUCTIONS MAY RESULT IN THE CANCELLATION OF YOUR SURGERY PATIENT SIGNATURE_________________________________  NURSE SIGNATURE__________________________________  ________________________________________________________________________   Adam Phenix  An incentive spirometer is a tool that can help keep your lungs clear and  active. This tool measures how well you are filling your lungs with each breath. Taking long deep breaths may help reverse or decrease the chance of developing breathing (pulmonary) problems (especially infection) following:  A long period of time when you are unable to move or be active. BEFORE THE PROCEDURE   If the spirometer includes an indicator to show your best effort, your nurse or respiratory therapist will set it to a desired goal.  If possible, sit up straight or lean slightly forward. Try not to slouch.  Hold the incentive spirometer in an upright position. INSTRUCTIONS FOR USE  1. Sit on the edge of your bed if possible, or sit up as far as you can in bed or on a chair. 2. Hold the incentive spirometer in an upright position. 3. Breathe out normally. 4. Place the mouthpiece in your mouth and seal your lips tightly around it. 5. Breathe in slowly and as deeply as possible, raising the piston or the ball toward the top of the column. 6. Hold your breath for 3-5 seconds or for as long as possible. Allow the piston or ball to fall to the bottom of the column. 7. Remove the mouthpiece from your mouth and breathe out normally. 8. Rest for a few seconds and repeat Steps 1 through 7 at least 10 times every 1-2 hours when you are awake. Take your time and take a few normal breaths between deep breaths. 9. The spirometer may include an indicator to show your best effort. Use the indicator as a goal to work toward during each repetition. 10. After each set of 10 deep breaths, practice coughing to be sure your lungs are clear. If you have an incision (the cut made at the time of surgery),  support your incision when coughing by placing a pillow or rolled up towels firmly against it. Once you are able to get out of bed, walk around indoors and cough well. You may stop using the incentive spirometer when instructed by your caregiver.  RISKS AND COMPLICATIONS  Take your time so you do not get  dizzy or light-headed.  If you are in pain, you may need to take or ask for pain medication before doing incentive spirometry. It is harder to take a deep breath if you are having pain. AFTER USE  Rest and breathe slowly and easily.  It can be helpful to keep track of a log of your progress. Your caregiver can provide you with a simple table to help with this. If you are using the spirometer at home, follow these instructions: [place] IF:   You are having difficultly using the spirometer.  You have trouble using the spirometer as often as instructed.  Your pain medication is not giving enough relief while using the spirometer.  You develop fever of 100.5 F (38.1 C) or higher. SEEK IMMEDIATE MEDICAL CARE IF:   You cough up bloody sputum that had not been present before.  You develop fever of 102 F (38.9 C) or greater.  You develop worsening pain at or near the incision site. MAKE SURE YOU:   Understand these instructions.  Will watch your condition.  Will get help right away if you are not doing well or get worse. Document Released: 08/24/2006 Document Revised: 07/06/2011 Document Reviewed: 10/25/2006 [facility] Patient Information 2014 [place], [place].   ________________________________________________________________________

## 2015-10-21 NOTE — Progress Notes (Addendum)
10-21-15 - Per Dr. Mercy RidingFoster's (anesthesia) request - talked to patient regarding heart attack in 1991. Pt. Stated that it was concluded that all the cold medicine she had taken caused artery to narrow.  Patient stated she did not have any follow up.

## 2015-10-25 ENCOUNTER — Encounter (HOSPITAL_COMMUNITY)
Admission: RE | Disposition: A | Discharge status: Skilled Nursing Facility | Payer: Self-pay | Source: Ambulatory Visit | Attending: Orthopaedic Surgery

## 2015-10-25 ENCOUNTER — Inpatient Hospital Stay (HOSPITAL_COMMUNITY)
Admission: RE | Admit: 2015-10-25 | Discharge: 2015-10-28 | DRG: 470 | Disposition: A | Discharge status: Skilled Nursing Facility | Payer: Medicare Other | Source: Ambulatory Visit | Attending: Orthopaedic Surgery | Admitting: Orthopaedic Surgery

## 2015-10-25 ENCOUNTER — Encounter (HOSPITAL_COMMUNITY): Payer: Self-pay | Admitting: *Deleted

## 2015-10-25 ENCOUNTER — Inpatient Hospital Stay (HOSPITAL_COMMUNITY): Payer: Medicare Other | Admitting: Anesthesiology

## 2015-10-25 ENCOUNTER — Inpatient Hospital Stay (HOSPITAL_COMMUNITY): Payer: Medicare Other

## 2015-10-25 DIAGNOSIS — M25551 Pain in right hip: Secondary | ICD-10-CM | POA: Diagnosis present

## 2015-10-25 DIAGNOSIS — M1611 Unilateral primary osteoarthritis, right hip: Secondary | ICD-10-CM | POA: Diagnosis present

## 2015-10-25 DIAGNOSIS — Z87891 Personal history of nicotine dependence: Secondary | ICD-10-CM | POA: Diagnosis not present

## 2015-10-25 DIAGNOSIS — Z6835 Body mass index (BMI) 35.0-35.9, adult: Secondary | ICD-10-CM | POA: Diagnosis not present

## 2015-10-25 DIAGNOSIS — I252 Old myocardial infarction: Secondary | ICD-10-CM | POA: Diagnosis not present

## 2015-10-25 DIAGNOSIS — K219 Gastro-esophageal reflux disease without esophagitis: Secondary | ICD-10-CM | POA: Diagnosis present

## 2015-10-25 DIAGNOSIS — Z419 Encounter for procedure for purposes other than remedying health state, unspecified: Secondary | ICD-10-CM

## 2015-10-25 DIAGNOSIS — Z96641 Presence of right artificial hip joint: Secondary | ICD-10-CM

## 2015-10-25 HISTORY — PX: TOTAL HIP ARTHROPLASTY: SHX124

## 2015-10-25 LAB — TYPE AND SCREEN
ABO/RH(D): A POS
Antibody Screen: NEGATIVE

## 2015-10-25 IMAGING — RF DG HIP (WITH PELVIS) OPERATIVE*R*
1 series · 2 of 2 positions shown · non-contrast
Comparison: None.

CLINICAL DATA: Right total hip arthroplasty.

EXAM:
OPERATIVE right HIP (WITH PELVIS IF PERFORMED) 2 VIEWS
TECHNIQUE: Fluoroscopic spot image(s) were submitted for interpretation
post-operatively.

[Series 1: run · 2 of 2 slices shown]
[im 1/2]
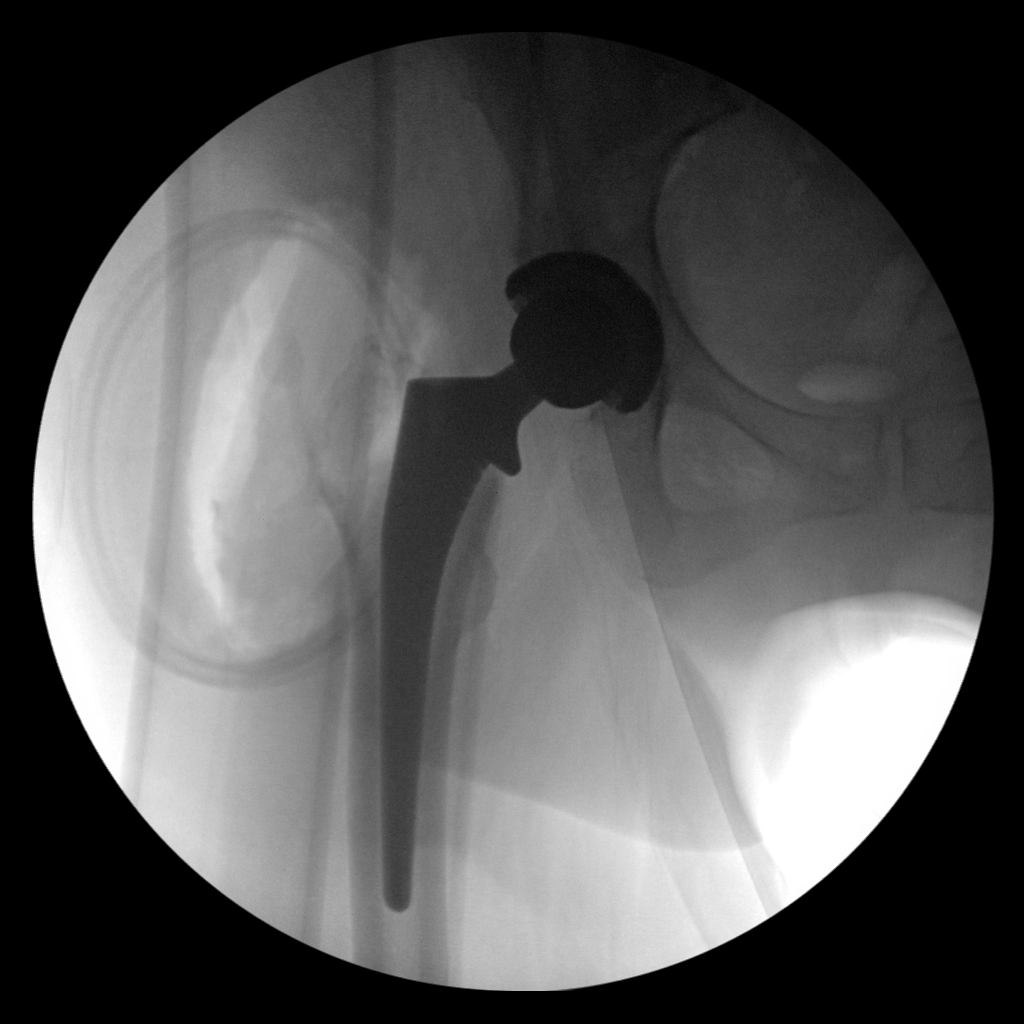
[im 2/2]
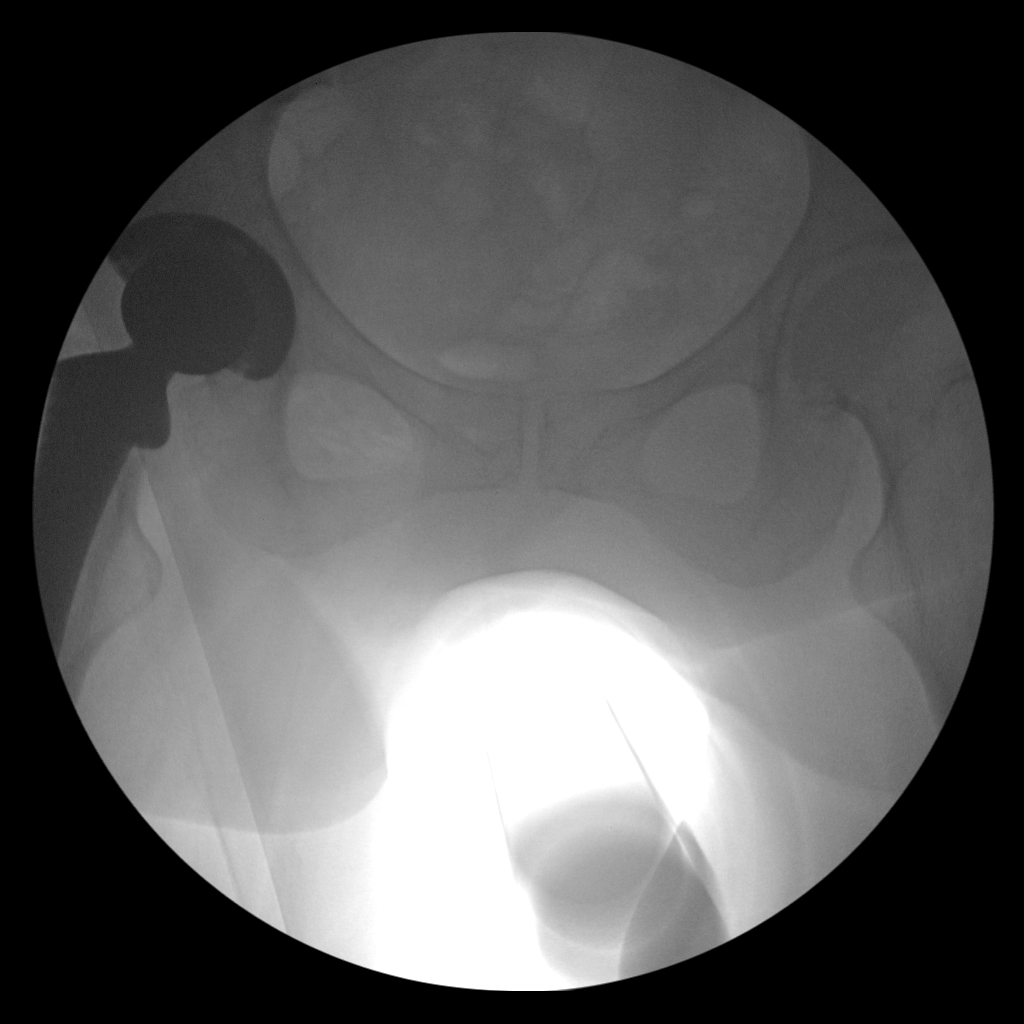

[2 of 2 positions shown; findings below may reference images not displayed]

FINDINGS: The examination demonstrates evidence of patient's right total hip
arthroplasty with prosthetic components intact and normally located.
Recommend correlation findings at the time of the procedure.
IMPRESSION: Expected changes post right total hip arthroplasty.

## 2015-10-25 IMAGING — DX DG HIP (WITH OR WITHOUT PELVIS) 1V PORT*R*
2 series · 2 of 2 positions shown · non-contrast
Comparison: Intraoperative images [DATE]

CLINICAL DATA: Status post right total hip replacement

EXAM:
DG HIP (WITH OR WITHOUT PELVIS) 2V PORT RIGHT

[pelvis ap]
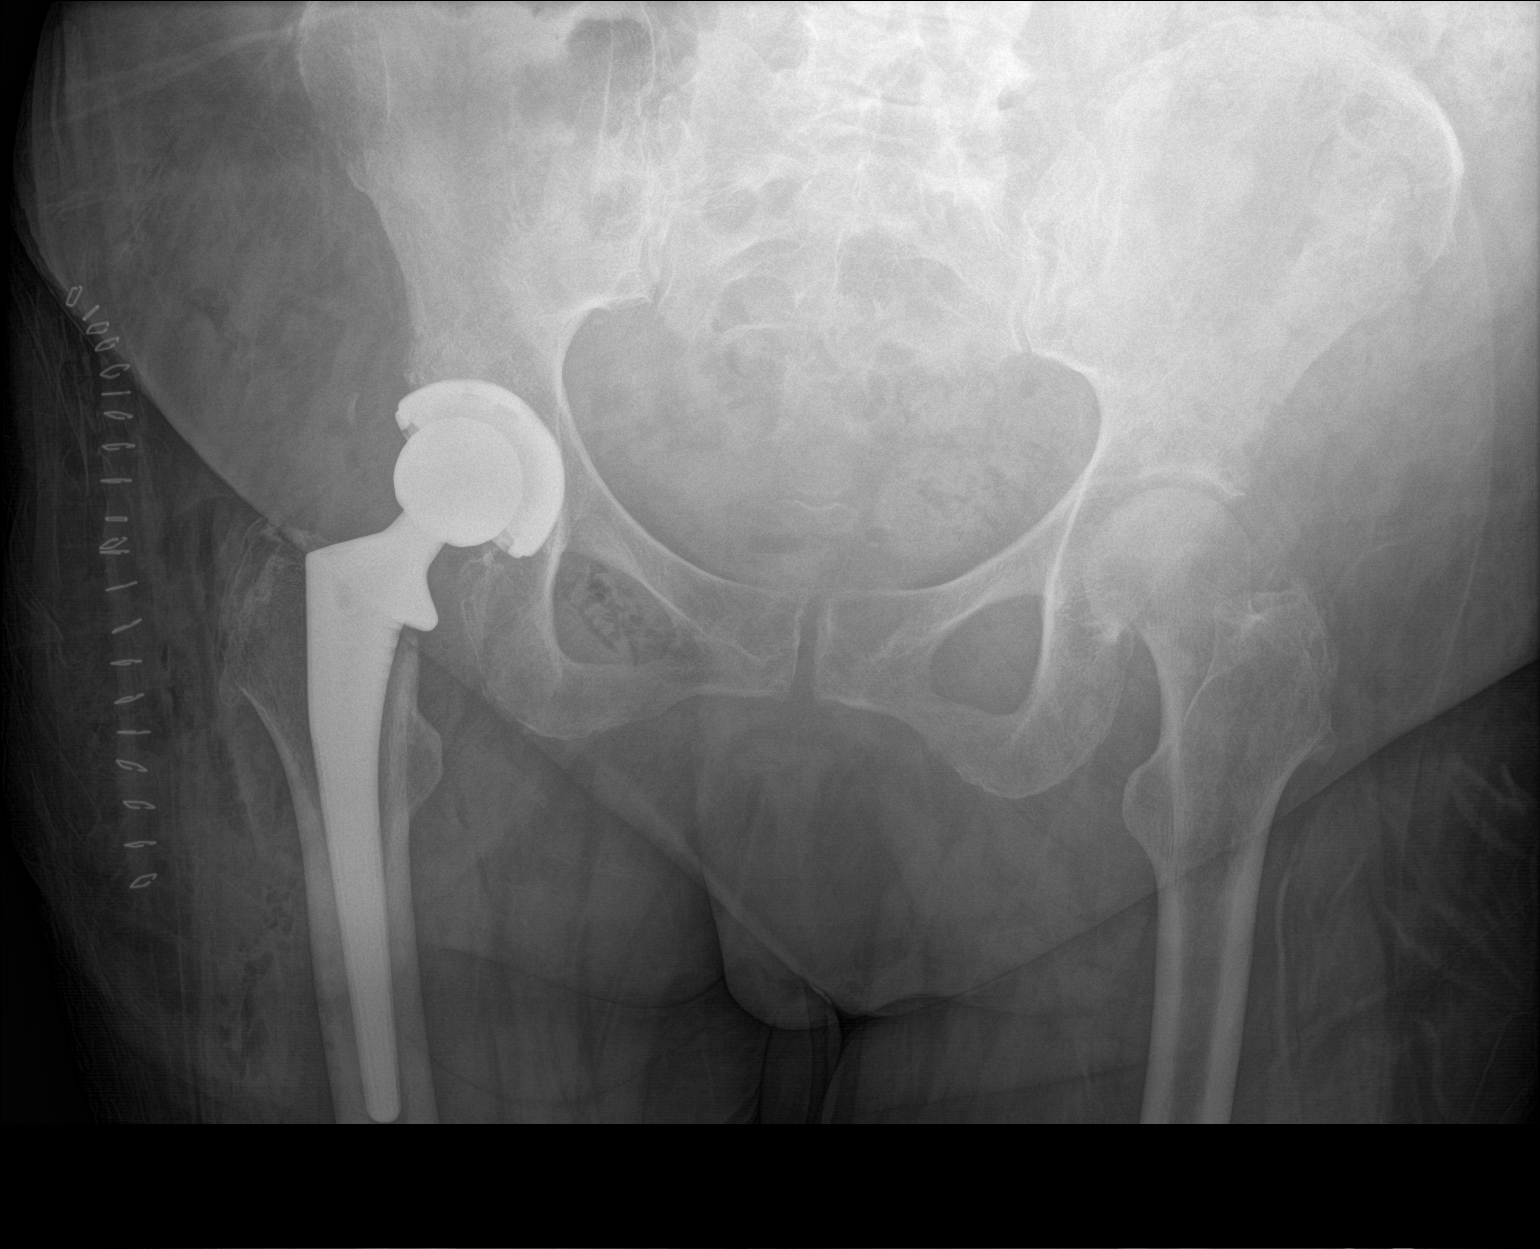

[hip frog leg]
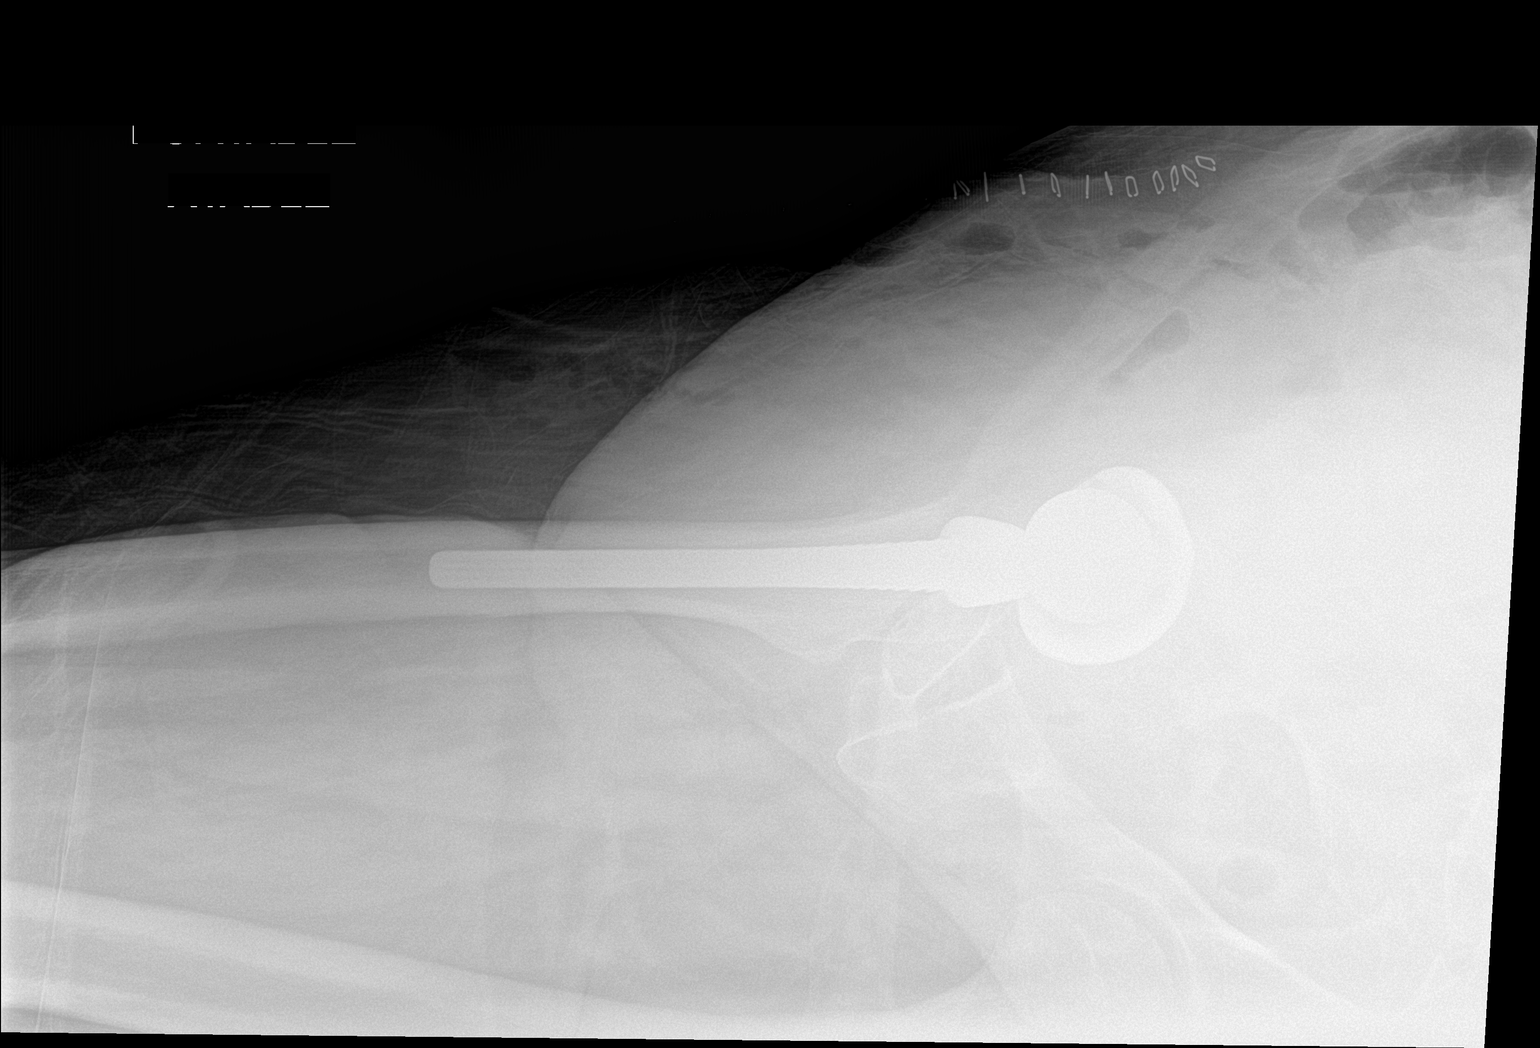

[2 of 2 positions shown; findings below may reference images not displayed]

FINDINGS: Frontal pelvis as well as cross-table lateral right hip images were
obtained. There is a total hip prosthesis on the right with
prosthetic components appearing well-seated. No fracture or
dislocation evident. The left hip joint appears unremarkable. Soft
tissue air on the right is an expected postoperative finding.
IMPRESSION: Total hip replacement on the right with prosthetic components
well-seated. No acute fracture or dislocation. Left hip joint
appears unremarkable.

## 2015-10-25 SURGERY — ARTHROPLASTY, HIP, TOTAL, ANTERIOR APPROACH
Anesthesia: Spinal | Site: Hip | Laterality: Right

## 2015-10-25 MED ORDER — DEXAMETHASONE SODIUM PHOSPHATE 10 MG/ML IJ SOLN
INTRAMUSCULAR | Status: DC | PRN
  Administered 2015-10-25: 10 mg via INTRAVENOUS

## 2015-10-25 MED ORDER — CALCIUM CARBONATE ANTACID 500 MG PO CHEW: 1.0000 | CHEWABLE_TABLET | Freq: Two times a day (BID) | ORAL | Status: DC | PRN

## 2015-10-25 MED ORDER — FENTANYL CITRATE (PF) 100 MCG/2ML IJ SOLN
INTRAMUSCULAR | Status: DC | PRN
  Administered 2015-10-25 (×5): 50 ug via INTRAVENOUS

## 2015-10-25 MED ORDER — SODIUM CHLORIDE 0.9 % IR SOLN
Status: DC | PRN
  Administered 2015-10-25: 1000 mL

## 2015-10-25 MED ORDER — PHENYLEPHRINE HCL 10 MG/ML IJ SOLN
INTRAMUSCULAR | Status: DC | PRN
  Administered 2015-10-25: 120 ug via INTRAVENOUS
  Administered 2015-10-25: 80 ug via INTRAVENOUS
  Administered 2015-10-25 (×2): 120 ug via INTRAVENOUS
  Administered 2015-10-25 (×3): 80 ug via INTRAVENOUS

## 2015-10-25 MED ORDER — OXYCODONE HCL 5 MG PO TABS
5.0000 mg | ORAL_TABLET | ORAL | Status: DC | PRN
  Administered 2015-10-25 – 2015-10-26 (×8): 10 mg via ORAL
  Administered 2015-10-27: 15 mg via ORAL
  Administered 2015-10-27: 10 mg via ORAL
  Administered 2015-10-27: 15 mg via ORAL
  Administered 2015-10-27: 10 mg via ORAL
  Administered 2015-10-27: 15 mg via ORAL
  Administered 2015-10-27: 10 mg via ORAL
  Administered 2015-10-28: 15 mg via ORAL
  Administered 2015-10-28: 5 mg via ORAL
  Administered 2015-10-28: 10 mg via ORAL
  Filled 2015-10-25: qty 2
  Filled 2015-10-25: qty 3
  Filled 2015-10-25 (×2): qty 2
  Filled 2015-10-25: qty 3
  Filled 2015-10-25: qty 2
  Filled 2015-10-25: qty 3
  Filled 2015-10-25 (×2): qty 2
  Filled 2015-10-25: qty 1
  Filled 2015-10-25 (×2): qty 2
  Filled 2015-10-25: qty 3
  Filled 2015-10-25 (×3): qty 2
  Filled 2015-10-25: qty 1
  Filled 2015-10-25: qty 2

## 2015-10-25 MED ORDER — CLINDAMYCIN PHOSPHATE 900 MG/50ML IV SOLN
INTRAVENOUS | Status: AC
Start: 2015-10-25 — End: 2015-10-25
  Filled 2015-10-25: qty 50

## 2015-10-25 MED ORDER — PROPOFOL 10 MG/ML IV BOLUS
INTRAVENOUS | Status: AC
  Filled 2015-10-25: qty 20

## 2015-10-25 MED ORDER — ALUM & MAG HYDROXIDE-SIMETH 200-200-20 MG/5ML PO SUSP: 30.0000 mL | ORAL | Status: DC | PRN

## 2015-10-25 MED ORDER — ACETAMINOPHEN 650 MG RE SUPP: 650.0000 mg | Freq: Four times a day (QID) | RECTAL | Status: DC | PRN

## 2015-10-25 MED ORDER — PHENYLEPHRINE 40 MCG/ML (10ML) SYRINGE FOR IV PUSH (FOR BLOOD PRESSURE SUPPORT)
PREFILLED_SYRINGE | INTRAVENOUS | Status: AC
  Filled 2015-10-25: qty 20

## 2015-10-25 MED ORDER — TRANEXAMIC ACID 1000 MG/10ML IV SOLN
1000.0000 mg | INTRAVENOUS | Status: AC
  Administered 2015-10-25: 1000 mg via INTRAVENOUS
  Filled 2015-10-25: qty 10

## 2015-10-25 MED ORDER — MIDAZOLAM HCL 2 MG/2ML IJ SOLN
INTRAMUSCULAR | Status: AC
Start: 2015-10-25 — End: 2015-10-25
  Filled 2015-10-25: qty 2

## 2015-10-25 MED ORDER — SUGAMMADEX SODIUM 200 MG/2ML IV SOLN
INTRAVENOUS | Status: AC
  Filled 2015-10-25: qty 2

## 2015-10-25 MED ORDER — SALONPAS EX PADS: 1.0000 | MEDICATED_PAD | Freq: Every day | CUTANEOUS | Status: DC

## 2015-10-25 MED ORDER — METOCLOPRAMIDE HCL 5 MG/ML IJ SOLN: 5.0000 mg | Freq: Three times a day (TID) | INTRAMUSCULAR | Status: DC | PRN

## 2015-10-25 MED ORDER — ROCURONIUM BROMIDE 100 MG/10ML IV SOLN
INTRAVENOUS | Status: AC
  Filled 2015-10-25: qty 1

## 2015-10-25 MED ORDER — PANTOPRAZOLE SODIUM 40 MG PO TBEC
40.0000 mg | DELAYED_RELEASE_TABLET | Freq: Every day | ORAL | Status: DC
  Administered 2015-10-25 – 2015-10-28 (×4): 40 mg via ORAL
  Filled 2015-10-25 (×4): qty 1

## 2015-10-25 MED ORDER — HYDROMORPHONE HCL 1 MG/ML IJ SOLN
INTRAMUSCULAR | Status: DC | PRN
  Administered 2015-10-25: 1 mg via INTRAVENOUS

## 2015-10-25 MED ORDER — ACETAMINOPHEN 325 MG PO TABS
650.0000 mg | ORAL_TABLET | Freq: Four times a day (QID) | ORAL | Status: DC | PRN
  Administered 2015-10-26: 650 mg via ORAL
  Filled 2015-10-25: qty 2

## 2015-10-25 MED ORDER — ONDANSETRON HCL 4 MG/2ML IJ SOLN: 4.0000 mg | Freq: Four times a day (QID) | INTRAMUSCULAR | Status: DC | PRN

## 2015-10-25 MED ORDER — HYDROMORPHONE HCL 1 MG/ML IJ SOLN
1.0000 mg | INTRAMUSCULAR | Status: DC | PRN
  Administered 2015-10-25 (×2): 1 mg via INTRAVENOUS
  Filled 2015-10-25 (×2): qty 1

## 2015-10-25 MED ORDER — ATROPINE SULFATE 0.4 MG/ML IJ SOLN
INTRAMUSCULAR | Status: AC
  Filled 2015-10-25: qty 1

## 2015-10-25 MED ORDER — POLYETHYLENE GLYCOL 3350 17 G PO PACK: 17.0000 g | PACK | Freq: Every day | ORAL | Status: DC | PRN

## 2015-10-25 MED ORDER — DIPHENHYDRAMINE HCL 12.5 MG/5ML PO ELIX: 12.5000 mg | ORAL_SOLUTION | ORAL | Status: DC | PRN

## 2015-10-25 MED ORDER — CLINDAMYCIN PHOSPHATE 600 MG/50ML IV SOLN
600.0000 mg | Freq: Four times a day (QID) | INTRAVENOUS | Status: AC
  Administered 2015-10-25 – 2015-10-26 (×2): 600 mg via INTRAVENOUS
  Filled 2015-10-25 (×2): qty 50

## 2015-10-25 MED ORDER — DEXAMETHASONE SODIUM PHOSPHATE 10 MG/ML IJ SOLN
INTRAMUSCULAR | Status: AC
  Filled 2015-10-25: qty 1

## 2015-10-25 MED ORDER — ONDANSETRON HCL 4 MG/2ML IJ SOLN
INTRAMUSCULAR | Status: AC
  Filled 2015-10-25: qty 2

## 2015-10-25 MED ORDER — ONDANSETRON HCL 4 MG/2ML IJ SOLN
INTRAMUSCULAR | Status: DC | PRN
  Administered 2015-10-25: 4 mg via INTRAVENOUS

## 2015-10-25 MED ORDER — PREDNISONE 5 MG PO TABS
15.0000 mg | ORAL_TABLET | Freq: Every day | ORAL | Status: DC
  Administered 2015-10-25 – 2015-10-28 (×4): 15 mg via ORAL
  Filled 2015-10-25 (×4): qty 3

## 2015-10-25 MED ORDER — LACTATED RINGERS IV SOLN
INTRAVENOUS | Status: DC
Start: 2015-10-25 — End: 2015-10-25
  Administered 2015-10-25 (×2): via INTRAVENOUS

## 2015-10-25 MED ORDER — MEPERIDINE HCL 50 MG/ML IJ SOLN
6.2500 mg | INTRAMUSCULAR | Status: DC | PRN
Start: 2015-10-25 — End: 2015-10-25

## 2015-10-25 MED ORDER — ZOLPIDEM TARTRATE 5 MG PO TABS: 5.0000 mg | ORAL_TABLET | Freq: Every evening | ORAL | Status: DC | PRN

## 2015-10-25 MED ORDER — HYDROMORPHONE HCL 1 MG/ML IJ SOLN
INTRAMUSCULAR | Status: AC
  Filled 2015-10-25: qty 1

## 2015-10-25 MED ORDER — OXYCODONE HCL 5 MG/5ML PO SOLN: 5.0000 mg | Freq: Once | ORAL | Status: DC | PRN

## 2015-10-25 MED ORDER — SODIUM CHLORIDE 0.9 % IJ SOLN
INTRAMUSCULAR | Status: AC
  Filled 2015-10-25: qty 10

## 2015-10-25 MED ORDER — MIDAZOLAM HCL 5 MG/5ML IJ SOLN
INTRAMUSCULAR | Status: DC | PRN
  Administered 2015-10-25: 2 mg via INTRAVENOUS

## 2015-10-25 MED ORDER — MENTHOL 3 MG MT LOZG: 1.0000 | LOZENGE | OROMUCOSAL | Status: DC | PRN

## 2015-10-25 MED ORDER — METHOCARBAMOL 500 MG PO TABS
500.0000 mg | ORAL_TABLET | Freq: Four times a day (QID) | ORAL | Status: DC | PRN
  Administered 2015-10-26 – 2015-10-28 (×7): 500 mg via ORAL
  Filled 2015-10-25 (×8): qty 1

## 2015-10-25 MED ORDER — PROPOFOL 10 MG/ML IV BOLUS
INTRAVENOUS | Status: DC | PRN
  Administered 2015-10-25: 150 mg via INTRAVENOUS

## 2015-10-25 MED ORDER — PHENTERMINE HCL 37.5 MG PO TABS: 37.5000 mg | ORAL_TABLET | Freq: Every day | ORAL | Status: DC

## 2015-10-25 MED ORDER — CLINDAMYCIN PHOSPHATE 900 MG/50ML IV SOLN
900.0000 mg | INTRAVENOUS | Status: AC
  Administered 2015-10-25: 900 mg via INTRAVENOUS

## 2015-10-25 MED ORDER — HYDROMORPHONE HCL 1 MG/ML IJ SOLN
INTRAMUSCULAR | Status: DC
Start: 2015-10-25 — End: 2015-10-25
  Filled 2015-10-25: qty 1

## 2015-10-25 MED ORDER — ROPINIROLE HCL 0.5 MG PO TABS
2.0000 mg | ORAL_TABLET | Freq: Every day | ORAL | Status: DC
  Administered 2015-10-25 – 2015-10-27 (×3): 2 mg via ORAL
  Filled 2015-10-25 (×3): qty 4

## 2015-10-25 MED ORDER — HYDROMORPHONE HCL 1 MG/ML IJ SOLN
0.2500 mg | INTRAMUSCULAR | Status: DC | PRN
Start: 2015-10-25 — End: 2015-10-25
  Administered 2015-10-25 (×2): 0.5 mg via INTRAVENOUS

## 2015-10-25 MED ORDER — PROPOFOL 10 MG/ML IV BOLUS
INTRAVENOUS | Status: AC
  Filled 2015-10-25: qty 40

## 2015-10-25 MED ORDER — HYDROMORPHONE HCL 2 MG/ML IJ SOLN
INTRAMUSCULAR | Status: AC
  Filled 2015-10-25: qty 1

## 2015-10-25 MED ORDER — DOCUSATE SODIUM 100 MG PO CAPS
100.0000 mg | ORAL_CAPSULE | Freq: Two times a day (BID) | ORAL | Status: DC
  Administered 2015-10-25 – 2015-10-28 (×6): 100 mg via ORAL
  Filled 2015-10-25 (×6): qty 1

## 2015-10-25 MED ORDER — METOCLOPRAMIDE HCL 5 MG PO TABS: 5.0000 mg | ORAL_TABLET | Freq: Three times a day (TID) | ORAL | Status: DC | PRN

## 2015-10-25 MED ORDER — LIDOCAINE HCL (CARDIAC) 20 MG/ML IV SOLN
INTRAVENOUS | Status: DC | PRN
  Administered 2015-10-25: 100 mg via INTRAVENOUS

## 2015-10-25 MED ORDER — EPHEDRINE SULFATE 50 MG/ML IJ SOLN
INTRAMUSCULAR | Status: AC
  Filled 2015-10-25: qty 1

## 2015-10-25 MED ORDER — SODIUM CHLORIDE 0.9 % IV SOLN
INTRAVENOUS | Status: DC
  Administered 2015-10-25: 22:00:00 via INTRAVENOUS

## 2015-10-25 MED ORDER — LIDOCAINE HCL (CARDIAC) 20 MG/ML IV SOLN
INTRAVENOUS | Status: AC
Start: 2015-10-25 — End: 2015-10-25
  Filled 2015-10-25: qty 5

## 2015-10-25 MED ORDER — ROCURONIUM BROMIDE 100 MG/10ML IV SOLN
INTRAVENOUS | Status: AC
Start: 2015-10-25 — End: 2015-10-25
  Filled 2015-10-25: qty 1

## 2015-10-25 MED ORDER — ASPIRIN EC 325 MG PO TBEC
325.0000 mg | DELAYED_RELEASE_TABLET | Freq: Two times a day (BID) | ORAL | Status: DC
  Administered 2015-10-26 – 2015-10-28 (×5): 325 mg via ORAL
  Filled 2015-10-25 (×5): qty 1

## 2015-10-25 MED ORDER — FENTANYL CITRATE (PF) 250 MCG/5ML IJ SOLN
INTRAMUSCULAR | Status: AC
Start: 2015-10-25 — End: 2015-10-25
  Filled 2015-10-25: qty 5

## 2015-10-25 MED ORDER — LIDOCAINE HCL (CARDIAC) 20 MG/ML IV SOLN
INTRAVENOUS | Status: AC
  Filled 2015-10-25: qty 5

## 2015-10-25 MED ORDER — SUGAMMADEX SODIUM 200 MG/2ML IV SOLN
INTRAVENOUS | Status: DC | PRN
  Administered 2015-10-25: 200 mg via INTRAVENOUS

## 2015-10-25 MED ORDER — SERTRALINE HCL 50 MG PO TABS
100.0000 mg | ORAL_TABLET | Freq: Every day | ORAL | Status: DC
  Administered 2015-10-25 – 2015-10-28 (×4): 100 mg via ORAL
  Filled 2015-10-25 (×5): qty 2

## 2015-10-25 MED ORDER — ONDANSETRON HCL 4 MG PO TABS: 4.0000 mg | ORAL_TABLET | Freq: Four times a day (QID) | ORAL | Status: DC | PRN

## 2015-10-25 MED ORDER — OXYCODONE HCL 5 MG PO TABS: 5.0000 mg | ORAL_TABLET | Freq: Once | ORAL | Status: DC | PRN

## 2015-10-25 MED ORDER — PHENOL 1.4 % MT LIQD: 1.0000 | OROMUCOSAL | Status: DC | PRN

## 2015-10-25 MED ORDER — KETOROLAC TROMETHAMINE 15 MG/ML IJ SOLN
7.5000 mg | Freq: Four times a day (QID) | INTRAMUSCULAR | Status: AC
  Administered 2015-10-25 – 2015-10-26 (×3): 7.5 mg via INTRAVENOUS
  Filled 2015-10-25 (×3): qty 1

## 2015-10-25 MED ORDER — METHOCARBAMOL 1000 MG/10ML IJ SOLN
500.0000 mg | Freq: Four times a day (QID) | INTRAVENOUS | Status: DC | PRN
  Administered 2015-10-25: 500 mg via INTRAVENOUS
  Filled 2015-10-25: qty 5
  Filled 2015-10-25: qty 550

## 2015-10-25 MED ORDER — KETOROLAC TROMETHAMINE 15 MG/ML IJ SOLN
INTRAMUSCULAR | Status: AC
  Filled 2015-10-25: qty 1

## 2015-10-25 SURGICAL SUPPLY — 37 items
APL SKNCLS STERI-STRIP NONHPOA (GAUZE/BANDAGES/DRESSINGS)
BAG SPEC THK2 15X12 ZIP CLS (MISCELLANEOUS)
BAG ZIPLOCK 12X15 (MISCELLANEOUS) IMPLANT
BENZOIN TINCTURE PRP APPL 2/3 (GAUZE/BANDAGES/DRESSINGS) IMPLANT
BLADE SAW SGTL 18X1.27X75 (BLADE) ×2 IMPLANT
CAPT HIP TOTAL 2 ×1 IMPLANT
CELLS DAT CNTRL 66122 CELL SVR (MISCELLANEOUS) ×1 IMPLANT
CLOTH BEACON ORANGE TIMEOUT ST (SAFETY) ×2 IMPLANT
DRAPE STERI IOBAN 125X83 (DRAPES) ×2 IMPLANT
DRAPE U-SHAPE 47X51 STRL (DRAPES) ×4 IMPLANT
DRSG AQUACEL AG ADV 3.5X10 (GAUZE/BANDAGES/DRESSINGS) ×2 IMPLANT
DURAPREP 26ML APPLICATOR (WOUND CARE) ×2 IMPLANT
ELECT REM PT RETURN 9FT ADLT (ELECTROSURGICAL) ×2
ELECTRODE REM PT RTRN 9FT ADLT (ELECTROSURGICAL) ×1 IMPLANT
GAUZE XEROFORM 1X8 LF (GAUZE/BANDAGES/DRESSINGS) ×1 IMPLANT
GLOVE BIO SURGEON STRL SZ7.5 (GLOVE) ×2 IMPLANT
GLOVE BIOGEL PI IND STRL 8 (GLOVE) ×2 IMPLANT
GLOVE BIOGEL PI INDICATOR 8 (GLOVE) ×2
GLOVE ECLIPSE 8.0 STRL XLNG CF (GLOVE) ×2 IMPLANT
GOWN STRL REUS W/TWL XL LVL3 (GOWN DISPOSABLE) ×4 IMPLANT
HANDPIECE INTERPULSE COAX TIP (DISPOSABLE) ×2
HOLDER FOLEY CATH W/STRAP (MISCELLANEOUS) ×2 IMPLANT
PACK ANTERIOR HIP CUSTOM (KITS) ×2 IMPLANT
RETRACTOR WND ALEXIS 18 MED (MISCELLANEOUS) ×1 IMPLANT
RTRCTR WOUND ALEXIS 18CM MED (MISCELLANEOUS) ×2
SET HNDPC FAN SPRY TIP SCT (DISPOSABLE) ×1 IMPLANT
STAPLER VISISTAT 35W (STAPLE) ×2 IMPLANT
STRIP CLOSURE SKIN 1/2X4 (GAUZE/BANDAGES/DRESSINGS) IMPLANT
SUT ETHIBOND NAB CT1 #1 30IN (SUTURE) ×2 IMPLANT
SUT MNCRL AB 4-0 PS2 18 (SUTURE) IMPLANT
SUT VIC AB 0 CT1 36 (SUTURE) ×2 IMPLANT
SUT VIC AB 1 CT1 36 (SUTURE) ×2 IMPLANT
SUT VIC AB 2-0 CT1 27 (SUTURE) ×4
SUT VIC AB 2-0 CT1 TAPERPNT 27 (SUTURE) ×2 IMPLANT
TRAY FOLEY W/METER SILVER 14FR (SET/KITS/TRAYS/PACK) ×2 IMPLANT
TRAY FOLEY W/METER SILVER 16FR (SET/KITS/TRAYS/PACK) ×1 IMPLANT
YANKAUER SUCT BULB TIP NO VENT (SUCTIONS) ×2 IMPLANT

## 2015-10-25 NOTE — Transfer of Care (Signed)
Immediate Anesthesia Transfer of Care Note  Patient: [name] Good  Procedure(s) Performed: Procedure(s): RIGHT TOTAL HIP ARTHROPLASTY ANTERIOR APPROACH (Right)  Patient Location: PACU  Anesthesia Type:General  Level of Consciousness: awake, alert  and oriented  Airway & Oxygen Therapy: Patient Spontanous Breathing and Patient connected to face mask oxygen  Post-op Assessment: Report given to RN and Post -op Vital signs reviewed and stable  Post vital signs: Reviewed and stable  Last Vitals:  Filed Vitals:   10/25/15 1024  BP: 140/68  Pulse: 81  Temp: 36.6 C  Resp: 18    Last Pain:  Filed Vitals:   10/25/15 1111  PainSc: 5          Complications: No apparent anesthesia complications

## 2015-10-25 NOTE — Brief Op Note (Signed)
10/25/2015  2:18 PM  PATIENT:  [name] Good  65 y.o. female  PRE-OPERATIVE DIAGNOSIS:  right hip osteoarthritis  POST-OPERATIVE DIAGNOSIS:  right hip osteoarthritis  PROCEDURE:  Procedure(s): RIGHT TOTAL HIP ARTHROPLASTY ANTERIOR APPROACH (Right)  SURGEON:  Surgeon(s) and Role:    * Kathryne Hitchhristopher Y Blackman, MD - Primary  ANESTHESIA:   general  EBL:  Total I/O In: 1750 [I.V.:1750] Out: 450 [Urine:100; Blood:350]  COUNTS:  YES  DICTATION: .Other Dictation: Dictation Number [id number]  PLAN OF CARE: Admit to inpatient   PATIENT DISPOSITION:  PACU - hemodynamically stable.   Delay start of Pharmacological VTE agent (>24hrs) due to surgical blood loss or risk of bleeding: no

## 2015-10-25 NOTE — Anesthesia Postprocedure Evaluation (Signed)
Anesthesia Post Note  Patient: Octaviano GlowBrenda J Atkins  Procedure(s) Performed: Procedure(s) (LRB): RIGHT TOTAL HIP ARTHROPLASTY ANTERIOR APPROACH (Right)  Patient location during evaluation: PACU Anesthesia Type: General Level of consciousness: awake and alert Pain management: pain level controlled Vital Signs Assessment: post-procedure vital signs reviewed and stable Respiratory status: spontaneous breathing, nonlabored ventilation, respiratory function stable and patient connected to face mask oxygen Cardiovascular status: blood pressure returned to baseline and stable Postop Assessment: no signs of nausea or vomiting Anesthetic complications: no    Last Vitals:  Filed Vitals:   10/25/15 1412 10/25/15 1415  BP: 128/60 145/75  Pulse: 88 88  Temp: 36.7 C   Resp: 15 17    Last Pain:  Filed Vitals:   10/25/15 1427  PainSc: 5                  Forrest Jaroszewski A

## 2015-10-25 NOTE — Anesthesia Procedure Notes (Signed)
Procedure Name: Intubation Date/Time: 10/25/2015 12:45 PM Performed by: Epimenio SarinJARVELA, Pape Parson R Pre-anesthesia Checklist: Patient identified, Emergency Drugs available, Suction available, Patient being monitored and Timeout performed Patient Re-evaluated:Patient Re-evaluated prior to inductionOxygen Delivery Method: Circle system utilized Preoxygenation: Pre-oxygenation with 100% oxygen Intubation Type: IV induction Ventilation: Mask ventilation without difficulty and Oral airway inserted - appropriate to patient size Laryngoscope Size: Mac and 3 Grade View: Grade I Tube type: Oral Tube size: 7.5 mm Number of attempts: 1 Airway Equipment and Method: Stylet Placement Confirmation: ETT inserted through vocal cords under direct vision,  positive ETCO2 and breath sounds checked- equal and bilateral Secured at: 21 cm Tube secured with: Tape Dental Injury: Teeth and Oropharynx as per pre-operative assessment

## 2015-10-25 NOTE — H&P (Signed)
TOTAL HIP ADMISSION H&P  Patient is admitted for right total hip arthroplasty.  Subjective:  Chief Complaint: right hip pain  HPI: [name] Good, 65 y.o. female, has a history of pain and functional disability in the right hip(s) due to arthritis and patient has failed non-surgical conservative treatments for greater than 12 weeks to include NSAID's and/or analgesics, corticosteriod injections, flexibility and strengthening excercises, supervised PT with diminished ADL's post treatment, use of assistive devices, weight reduction as appropriate and activity modification.  Onset of symptoms was abrupt starting 2 years ago with gradually worsening course since that time.The patient noted no past surgery on the right hip(s).  Patient currently rates pain in the right hip at 10 out of 10 with activity. Patient has night pain, worsening of pain with activity and weight bearing, pain that interfers with activities of daily living and pain with passive range of motion. Patient has evidence of subchondral sclerosis, periarticular osteophytes and joint space narrowing by imaging studies. This condition presents safety issues increasing the risk of falls.  There is no current active infection.  Patient Active Problem List   Diagnosis Date Noted  . Osteoarthritis of right hip 10/25/2015  . S/P lumbar laminectomy 06/14/2015   Past Medical History  Diagnosis Date  . Myocardial infarction (HCC) 89    no visits to cardiac dr(thomas kelly) since 89  . Depression   . Arthritis   . Wound dehiscence     lumbar  . GERD (gastroesophageal reflux disease)     Past Surgical History  Procedure Laterality Date  . Cardiac catheterization  89  . Breast surgery Left     cyst  . Cholecystectomy      65 yrs old  . Tubal ligation    . Appendectomy      17 yrs  . Lumbar laminectomy/decompression microdiscectomy N/A 06/14/2015    Procedure: Lumbar Three-Four,Lumbar Four-Five, Lumbar Five-Sacral One Laminectomy;   Surgeon: Tia Alertavid S Jones, MD;  Location: [facility];  Service: Neurosurgery;  Laterality: N/A;  . Lumbar wound debridement N/A 07/24/2015    Procedure: lumbar wound revision;  Surgeon: Tia Alertavid S Jones, MD;  Location: [facility];  Service: Neurosurgery;  Laterality: N/A;    No prescriptions prior to admission   Allergies  Allergen Reactions  . Amoxicillin Other (See Comments)    Has patient had a PCN reaction causing immediate rash, facial/tongue/throat swelling, SOB or lightheadedness with hypotension: No Has patient had a PCN reaction causing severe rash involving mucus membranes or skin necrosis: No Has patient had a PCN reaction that required hospitalization No Has patient had a PCN reaction occurring within the last 10 years: No If all of the above answers are "NO", then may proceed with Cephalosporin use.  Unknown reaction Pt states it made her feel very sick    Social History  Substance Use Topics  . Smoking status: Former Smoker -- 2.00 packs/day for 40 years    Types: Cigarettes    Quit date: 04/10/2013  . Smokeless tobacco: Never Used  . Alcohol Use: Yes     Comment: rarely    No family history on file.   Review of Systems  Musculoskeletal: Positive for joint pain.  All other systems reviewed and are negative.   Objective:  Physical Exam  Constitutional: She is oriented to person, place, and time. She appears well-developed and well-nourished.  HENT:  Head: Normocephalic and atraumatic.  Eyes: EOM are normal. Pupils are equal, round, and reactive to light.  Neck: Normal range of motion. Neck supple.  Cardiovascular: Normal rate and regular rhythm.   Respiratory: Effort normal and breath sounds normal.  GI: Soft. Bowel sounds are normal.  Musculoskeletal:       Right hip: She exhibits decreased range of motion, decreased strength, tenderness, bony tenderness and crepitus.  Neurological: She is alert and oriented to person, place, and time.  Skin: Skin is warm  and dry.  Psychiatric: She has a normal mood and affect.    Vital signs in last 24 hours:    Labs:   Estimated body mass index is 35.97 kg/(m^2) as calculated from the following:   Height as of 07/24/15: 5' 2.5" (1.588 m).   Weight as of 07/24/15: 90.719 kg (200 lb).   Imaging Review Plain radiographs demonstrate severe degenerative joint disease of the right hip(s). The bone quality appears to be good for age and reported activity level.  Assessment/Plan:  End stage arthritis, right hip(s)  The patient history, physical examination, clinical judgement of the provider and imaging studies are consistent with end stage degenerative joint disease of the right hip(s) and total hip arthroplasty is deemed medically necessary. The treatment options including medical management, injection therapy, arthroscopy and arthroplasty were discussed at length. The risks and benefits of total hip arthroplasty were presented and reviewed. The risks due to aseptic loosening, infection, stiffness, dislocation/subluxation,  thromboembolic complications and other imponderables were discussed.  The patient acknowledged the explanation, agreed to proceed with the plan and consent was signed. Patient is being admitted for inpatient treatment for surgery, pain control, PT, OT, prophylactic antibiotics, VTE prophylaxis, progressive ambulation and ADL's and discharge planning.The patient is planning to be discharged home with home health services

## 2015-10-25 NOTE — Anesthesia Preprocedure Evaluation (Signed)
Anesthesia Evaluation  Patient identified by MRN, date of birth, ID band Patient awake    Airway Mallampati: II  TM Distance: >3 FB Neck ROM: Full    Dental  (+) Edentulous Upper, Edentulous Lower, Dental Advisory Given   Pulmonary former smoker,    breath sounds clear to auscultation       Cardiovascular + Past MI   Rhythm:Regular Rate:Normal     Neuro/Psych    GI/Hepatic GERD  Medicated and Controlled,  Endo/Other  Morbid obesity  Renal/GU      Musculoskeletal   Abdominal   Peds  Hematology   Anesthesia Other Findings   Reproductive/Obstetrics                             Anesthesia Physical Anesthesia Plan  ASA: III  Anesthesia Plan: Spinal   Post-op Pain Management:    Induction: Intravenous  Airway Management Planned: Simple Face Mask  Additional Equipment:   Intra-op Plan:   Post-operative Plan:   Informed Consent: I have reviewed the patients History and Physical, chart, labs and discussed the procedure including the risks, benefits and alternatives for the proposed anesthesia with the patient or authorized representative who has indicated his/her understanding and acceptance.   Dental advisory given  Plan Discussed with: CRNA, Anesthesiologist and Surgeon  Anesthesia Plan Comments:         Anesthesia Quick Evaluation

## 2015-10-26 LAB — BASIC METABOLIC PANEL
Anion gap: 7 (ref 5–15)
BUN: 19 mg/dL (ref 6–20)
CO2: 25 mmol/L (ref 22–32)
Calcium: 8.3 mg/dL — ABNORMAL LOW (ref 8.9–10.3)
Chloride: 103 mmol/L (ref 101–111)
Creatinine, Ser: 0.65 mg/dL (ref 0.44–1.00)
GFR calc Af Amer: >60 mL/min (ref >60)
GFR calc non Af Amer: >60 mL/min (ref >60)
Glucose, Bld: 126 mg/dL — ABNORMAL HIGH (ref 65–99)
Potassium: 4.3 mmol/L (ref 3.5–5.1)
Sodium: 135 mmol/L (ref 135–145)

## 2015-10-26 LAB — CBC
HCT: 32.8 % — ABNORMAL LOW (ref 36.0–46.0)
Hemoglobin: 11 g/dL — ABNORMAL LOW (ref 12.0–15.0)
MCH: 31.3 pg (ref 26.0–34.0)
MCHC: 33.5 g/dL (ref 30.0–36.0)
MCV: 93.4 fL (ref 78.0–100.0)
Platelets: 234 10*3/uL (ref 150–400)
RBC: 3.51 MIL/uL — ABNORMAL LOW (ref 3.87–5.11)
RDW: 13 % (ref 11.5–15.5)
WBC: 14.6 10*3/uL — ABNORMAL HIGH (ref 4.0–10.5)

## 2015-10-26 NOTE — Op Note (Signed)
NAMOletta Lamas:  Good, [name]                ACCOUNT NO.:  [id number]  MEDICAL RECORD NO.:  [id number]  LOCATION:  1601                         [facility]  PHYSICIAN:  Vanita PandaChristopher Y. Magnus IvanBlackman, M.D.DATE OF BIRTH:  1951/03/10  DATE OF PROCEDURE:  10/25/2015 DATE OF DISCHARGE:                              OPERATIVE REPORT   PREOPERATIVE DIAGNOSIS:  Primary osteoarthritis and degenerative joint disease, right hip.  POSTOPERATIVE DIAGNOSIS:  Primary osteoarthritis and degenerative joint disease, right hip.  PROCEDURE:  Right total hip arthroplasty with direct anterior approach.  IMPLANTS:  DePuy Sector Gription acetabular component size 50, size 32+ 0 neutral polyethylene liner, size 11 Corail femoral component standard with varus offset, size 32+ 1 ceramic hip ball.  SURGEON:  Vanita PandaChristopher Y. Magnus IvanBlackman, MD  ASSISTANT:  OR staff.  ANTIBIOTICS:  2 g of IV Ancef.  BLOOD LOSS:  350 mL.  COMPLICATIONS:  None.  INDICATIONS:  [name] Good is a 65 year old female with debilitating back pain as well as right hip pain.  Her x-rays show complete loss of superior lateral aspect of her hip.  There is bone-on-bone wear.  There are periarticular osteophytes, sclerotic changes as well on x-ray.  Her pain is daily.  It is 10/10.  It has detrimentally affected her activities of daily living, her mobility, and quality of life.  She has tried walking with assistive device, anti-inflammatories, rest, ice, heat, and activity modification.  At this point, she wished to proceed with a total hip arthroplasty.  We talked about the risk of acute blood loss anemia, nerve and vessel injury, fracture, infection, dislocation, DVT.  She understands our goals are decreased pain, improved mobility, and overall improved quality of life.  PROCEDURE DESCRIPTION:  After informed consent was obtained, appropriate right hip was marked.  She was brought to the operating room, where they attempted spinal anesthesia but  that was not successful as her previous back surgery.  They then proceed with general anesthesia.  She was laid in a supine position and general anesthesia was obtained.  A Foley catheter was placed.  Then, both feet had traction boots applied to them.  Next, she was placed supine on the Hana fracture table.  The perineal post was in place and both legs inline with skeletal traction devices, but no traction applied.  Her right operative hip was prepped and draped with DuraPrep and sterile drapes.  A time-out was called to identify correct patient, correct right hip.  We then made our standard anterior approach to the hip making our incision just inferior and posterior to the anterior superior iliac spine and carried this slightly obliquely down the leg.  We dissected down the tensor fascia lata muscle and tensor fascia was then divided longitudinally, so we could proceed with a direct anterior approach to the hip.  We identified and cauterized the circumflex vessels and identified the hip capsule.  I opened up the hip capsule in an L-type format exposing the joint capsule with significant periarticular osteophytes and a joint effusion.  We then placed Cobra retractors within joint capsule and made our femoral neck cut with an oscillating saw proximal to the lesser trochanter.  We completed this with an  osteotome.  We placed a corkscrew guide in the femoral head and removed the femoral head in its entirety.  We found it to be a completely devoid of cartilage.  We then removed remnants of acetabular labrum from the labrum and placed a bent Hohmann over the medial acetabular rim.  I then began reaming under direct visualization from a size 42 reamer up to a size 50 with all reamers again under direct visualization, the last reamer under direct fluoroscopy, so I could obtain my depth of reaming, our inclination, and anteversion. Once I was pleased with this, I placed the real DePuy Sector  Gription acetabular component size 50 and a 32+ 0 polyethylene liner for that size acetabular component.  Attention was then turned to the femur. With the leg externally rotated to 120 degrees, extended, and adducted, we were able to place a Mueller retractor medially and a Hohmann retractor behind the greater trochanter.  I released the lateral joint capsule and used a box cutting osteotome to enter the femoral canal and a rongeur to lateralize.  I then began broaching from a size 8 broach up to a size 11.  With the size 11, I placed a calcar planer and placed a standard femoral neck and a 32+ 1 hip ball and brought the leg back over and up with traction and rotation reducing the pelvis that was stable, although I was not pleased with leg lengths being long.  We dislocated the hip and removed the trial components.  I then went back down to a size 10 but the size 10 stem cannot bear too deep.  I then went back to the 11 stem.  I got a little bit deeper and used a calcar planer off this and then went with the real size 11 femoral component with varus offset, the shorter some more, and a 32 oval was 1 ceramic hip ball.  We brought the leg back over with traction and internal rotation.  It was stable.  My fear was as I felt shorter will be unstable.  Her leg lengths do show she is slightly long.  We then irrigated the soft tissue with normal saline solution using pulsatile lavage.  We closed the joint capsule with interrupted #1 Ethibond suture followed by a running #1 Vicryl in the tensor fascia, 0 Vicryl in the deep tissue, 2-0 Vicryl in the subcutaneous tissue, and interrupted staples on the skin.  Xeroform and Aquacel dressing were applied.  She was taken off the Hana table, awakened, extubated, and taken to the recovery room in stable condition. All final counts were correct.  There were no complications noted.     Vanita Pandahristopher Y. Magnus IvanBlackman, M.D.     CYB/MEDQ  D:  10/25/2015  T:   10/26/2015  Job:  [id number]

## 2015-10-26 NOTE — Evaluation (Signed)
Physical Therapy Evaluation Patient Details Name: [name] Good MRN: [id number] DOB: August 12, 1950 Today's Date: 10/26/2015   History of Present Illness  [name] Good  Clinical Impression  The patient tolerated short ambulation  This visit. She may have limited caregivers at DC. The patient will benefit from PT while in acute care to address problems listed in the note below. Recommend  HHPT at DC.    Follow Up Recommendations Home health PT;Supervision/Assistance - 24 hour    Equipment Recommendations  None recommended by PT (will try ther Rollator, may need reg RW)    Recommendations for Other Services       Precautions / Restrictions Precautions Precautions: Fall      Mobility  Bed Mobility Overal bed mobility: Needs Assistance Bed Mobility: Supine to Sit     Supine to sit: Min assist;HOB elevated     General bed mobility comments: patient used rails and HOB elevated  Transfers Overall transfer level: Needs assistance Equipment used: Rolling walker (2 wheeled) Transfers: Sit to/from Stand Sit to Stand: Min assist         General transfer comment: cues for hand and the [name] leg  placement  Ambulation/Gait Ambulation/Gait assistance: Min assist Ambulation Distance (Feet): 10 Feet (then 20') Assistive device: Rolling walker (2 wheeled) Gait Pattern/deviations: Step-to pattern     General Gait Details: multimodal cues for sequence and position inside the RW. the IV in  left hand is painful and does not allow   to use hand on RW correctly.  Stairs            Wheelchair Mobility    Modified Rankin (Stroke Patients Only)       Balance                                             Pertinent Vitals/Pain Pain Assessment: 0-10 Pain Score: 6  Pain Location: [name] thigh and hip, IV in wrist    Home Living Family/patient expects to be discharged to:: Private residence Living Arrangements: Spouse/significant other Available Help at Discharge:  Family Type of Home: Mobile home Home Access: Stairs to enter Entrance Stairs-Rails: Left Entrance Stairs-Number of Steps: 4 Home Layout: One level Home Equipment: Grab bars - tub/shower;Cane - single point;Walker - 4 wheels      Prior Function Level of Independence: Independent;Independent with assistive device(s)               Hand Dominance        Extremity/Trunk Assessment   Upper Extremity Assessment: Defer to OT evaluation           Lower Extremity Assessment: RLE deficits/detail RLE Deficits / Details: able  too advance the leg    Cervical / Trunk Assessment: Normal  Communication   Communication: No difficulties  Cognition Arousal/Alertness: Awake/alert Behavior During Therapy: WFL for tasks assessed/performed (emotional) Overall Cognitive Status: Within Functional Limits for tasks assessed                      General Comments      Exercises        Assessment/Plan    PT Assessment Patient needs continued PT services  PT Diagnosis Difficulty walking;Acute pain   PT Problem List Decreased strength;Decreased range of motion;Decreased activity tolerance;Decreased mobility;Decreased knowledge of use of DME;Decreased safety awareness;Decreased knowledge of precautions  PT Treatment Interventions DME instruction;Gait  training;Stair training;Functional mobility training;Therapeutic activities;Therapeutic exercise;Patient/family education   PT Goals (Current goals can be found in the Care Plan section) Acute Rehab PT Goals Patient Stated Goal: to go home PT Goal Formulation: With patient Time For Goal Achievement: 10/29/15 Potential to Achieve Goals: Good    Frequency 7X/week   Barriers to discharge Decreased caregiver support spouse works    Co-evaluation PT/OT/SLP Co-Evaluation/Treatment: Yes Reason for Co-Treatment: For patient/therapist safety PT goals addressed during session: Mobility/safety with mobility OT goals addressed  during session: ADL's and self-care       End of Session   Activity Tolerance: Patient tolerated treatment well Patient left: in chair;with call bell/phone within reach Nurse Communication: Mobility status         Time: 1002-1026 PT Time Calculation (min) (ACUTE ONLY): 24 min   Charges:   PT Evaluation $PT Eval Low Complexity: 1 Procedure     PT G CodesRada Good:        [name] Good [name] Good 10/26/2015, 12:35 PM [name] Good PT [phone]

## 2015-10-26 NOTE — Progress Notes (Signed)
Occupational Therapy Evaluation Patient Details Name: [name] GlowBrenda J Erpelding MRN: [id number] DOB: 1950/07/01 Today's Date: 10/26/2015    History of Present Illness [name]   Clinical Impression   Patient limited by pain this session. Patient will benefit from skilled OT to maximize ADL independence and safety prior to d/c home with limited assistance. OT will follow.    Follow Up Recommendations  No OT follow up;Supervision/Assistance - 24 hour    Equipment Recommendations  3 in 1 bedside comode    Recommendations for Other Services       Precautions / Restrictions Precautions Precautions: Fall Restrictions Weight Bearing Restrictions: No Other Position/Activity Restrictions: WBAT      Mobility Bed Mobility Overal bed mobility: Needs Assistance Bed Mobility: Supine to Sit     Supine to sit: Min assist;HOB elevated     General bed mobility comments: patient used rails and HOB elevated  Transfers Overall transfer level: Needs assistance Equipment used: Rolling walker (2 wheeled) Transfers: Sit to/from Stand Sit to Stand: Min assist         General transfer comment: cues for hand and the R leg  placement    Balance                                            ADL Overall ADL's : Needs assistance/impaired Eating/Feeding: Independent;Sitting   Grooming: Wash/dry hands;Min guard;Standing           Upper Body Dressing : Minimal assistance;Sitting   Lower Body Dressing: Moderate assistance;Sit to/from stand   Toilet Transfer: Minimal assistance;Ambulation;Regular Toilet;BSC;RW   Toileting- ArchitectClothing Manipulation and Hygiene: Min guard;Sit to/from stand       Functional mobility during ADLs: Min guard;Minimal assistance;Rolling walker General ADL Comments: Patient reports she used a step stool and a cane to perform tub transfer PTA. Will need to be educated on safer technique for tub transfer.      Vision     Perception     Praxis      Pertinent Vitals/Pain Pain Assessment: 0-10 Pain Score: 6  Pain Location: R thigh/hip, IV site in wrist Pain Descriptors / Indicators: Burning;Sore;Grimacing Pain Intervention(s): Limited activity within patient's tolerance;Monitored during session;Premedicated before session;Repositioned;Ice applied     Hand Dominance     Extremity/Trunk Assessment Upper Extremity Assessment Upper Extremity Assessment: Overall WFL for tasks assessed   Lower Extremity Assessment Lower Extremity Assessment: Defer to PT evaluation RLE Deficits / Details: able  too advance the leg   Cervical / Trunk Assessment Cervical / Trunk Assessment: Normal   Communication Communication Communication: No difficulties   Cognition Arousal/Alertness: Awake/alert Behavior During Therapy: WFL for tasks assessed/performed Overall Cognitive Status: Within Functional Limits for tasks assessed                     General Comments       Exercises       Shoulder Instructions      Home Living Family/patient expects to be discharged to:: Private residence Living Arrangements: Spouse/significant other Available Help at Discharge: Family Type of Home: Mobile home Home Access: Stairs to enter Secretary/administratorntrance Stairs-Number of Steps: 4 Entrance Stairs-Rails: Left Home Layout: One level     Bathroom Shower/Tub: Chief Strategy OfficerTub/shower unit   Bathroom Toilet: Handicapped height Bathroom Accessibility: Yes How Accessible: Accessible via walker Home Equipment: Grab bars - tub/shower;Cane - single point;Walker - 4 wheels; Reacher  Prior Functioning/Environment Level of Independence: Independent;Independent with assistive device(s)        Comments: use cane and step stool to step over tub prior to admission    OT Diagnosis: Acute pain   OT Problem List: Decreased strength;Decreased activity tolerance;Decreased knowledge of use of DME or AE;Pain   OT Treatment/Interventions: Self-care/ADL training;DME  and/or AE instruction;Therapeutic activities;Patient/family education    OT Goals(Current goals can be found in the care plan section) Acute Rehab OT Goals Patient Stated Goal: to go home OT Goal Formulation: With patient Time For Goal Achievement: 11/09/15 Potential to Achieve Goals: Good ADL Goals Pt Will Perform Lower Body Bathing: with supervision;sit to/from stand Pt Will Perform Lower Body Dressing: with supervision;with adaptive equipment;sit to/from stand Pt Will Transfer to Toilet: with supervision;ambulating;bedside commode Pt Will Perform Toileting - Clothing Manipulation and hygiene: with supervision;sit to/from stand Pt Will Perform Tub/Shower Transfer: Tub transfer;with min assist;with caregiver independent in assisting;rolling walker  OT Frequency: Min 2X/week   Barriers to D/C:            Co-evaluation PT/OT/SLP Co-Evaluation/Treatment: Yes Reason for Co-Treatment: For patient/therapist safety PT goals addressed during session: Mobility/safety with mobility OT goals addressed during session: ADL's and self-care      End of Session Equipment Utilized During Treatment: Rolling walker Nurse Communication: Mobility status  Activity Tolerance: Patient tolerated treatment well Patient left: in chair;with call bell/phone within reach;with chair alarm set   Time: 1002-1026 OT Time Calculation (min): 24 min Charges:  OT General Charges $OT Visit: 1 Procedure OT Evaluation $OT Eval Low Complexity: 1 Procedure G-Codes:    Avaley Coop A 10/26/2015, 12:45 PM

## 2015-10-26 NOTE — Progress Notes (Signed)
Physical Therapy Treatment Patient Details Name: [name] Good MRN: [id number] DOB: [birth year] Today's Date: 10/26/2015    History of Present Illness [name]    PT Comments    Inproving in mobility. Requires cues for staying inside the RW. Patient has a 4 wheeled RW so will try on tomorrow. Plans for  DC tomorrow.She may be home alone during the day.  Follow Up Recommendations  Home health PT;Supervision/Assistance - 24 hour     Equipment Recommendations  None recommended by PT    Recommendations for Other Services       Precautions / Restrictions Precautions Precautions: Fall Restrictions Weight Bearing Restrictions: No Other Position/Activity Restrictions: WBAT    Mobility  Bed Mobility Overal bed mobility: Needs Assistance Bed Mobility: Sit to Supine     Supine to sit: Min assist;HOB elevated Sit to supine: Min assist   General bed mobility comments: assist with the right leg onto the bed.  Transfers Overall transfer level: Needs assistance Equipment used: Rolling walker (2 wheeled) Transfers: Sit to/from Stand Sit to Stand: Supervision         General transfer comment: cues for hand and the R leg  placement  Ambulation/Gait Ambulation/Gait assistance: Min assist Ambulation Distance (Feet): 20 Feet (x 2) Assistive device: Rolling walker (2 wheeled) Gait Pattern/deviations: Step-to pattern     General Gait Details: multimodal cues for sequence and position inside the RW. tends to place RW too far.    Stairs            Wheelchair Mobility    Modified Rankin (Stroke Patients Only)       Balance                                    Cognition Arousal/Alertness: Awake/alert Behavior During Therapy: WFL for tasks assessed/performed Overall Cognitive Status: Within Functional Limits for tasks assessed                      Exercises Total Joint Exercises Ankle Circles/Pumps: AROM;Both;10 reps;Supine Quad Sets:  AROM;Both;10 reps;Supine Short Arc Quad: AROM;Right;10 reps;Supine Heel Slides: AAROM;Right;10 reps;Supine Hip ABduction/ADduction: AAROM;Right;10 reps;Supine    General Comments        Pertinent Vitals/Pain Pain Assessment: 0-10 Pain Score: 8  Pain Location: R hip Pain Descriptors / Indicators: Tender;Aching Pain Intervention(s): Limited activity within patient's tolerance;Monitored during session;Premedicated before session    Home Living Family/patient expects to be discharged to:: Private residence Living Arrangements: Spouse/significant other Available Help at Discharge: Family Type of Home: Mobile home Home Access: Stairs to enter Entrance Stairs-Rails: Left Home Layout: One level Home Equipment: Grab bars - tub/shower;Cane - single point;Walker - 4 wheels      Prior Function Level of Independence: Independent;Independent with assistive device(s)      Comments: use cane and step stool to step over tub prior to admission   PT Goals (current goals can now be found in the care plan section) Acute Rehab PT Goals Patient Stated Goal: to go home PT Goal Formulation: With patient Time For Goal Achievement: 10/29/15 Potential to Achieve Goals: Good Progress towards PT goals: Progressing toward goals    Frequency  7X/week    PT Plan Current plan remains appropriate    Co-evaluation PT/OT/SLP Co-Evaluation/Treatment: Yes Reason for Co-Treatment: For patient/therapist safety PT goals addressed during session: Mobility/safety with mobility OT goals addressed during session: ADL's and self-care  End of Session   Activity Tolerance: Patient tolerated treatment well Patient left: with call bell/phone within reach;in bed     Time: [id number] PT Time Calculation (min) (ACUTE ONLY): 24 min  Charges:  $Gait Training: 8-22 mins $Therapeutic Exercise: 8-22 mins                    G Codes:      Rada HayHill, [name] Good 10/26/2015, 3:59 PM

## 2015-10-26 NOTE — Progress Notes (Signed)
Subjective: 1 Day Post-Op Procedure(s) (LRB): RIGHT TOTAL HIP ARTHROPLASTY ANTERIOR APPROACH (Right) Patient reports pain as moderate.    Objective: Vital signs in last 24 hours: Temp:  [98 F (36.7 C)-98.4 F (36.9 C)] 98 F (36.7 C) (07/01 0952) Pulse Rate:  [76-88] 83 (07/01 0952) Resp:  [7-18] 16 (07/01 0952) BP: (95-148)/(45-82) 117/48 mmHg (07/01 0952) SpO2:  [97 %-100 %] 98 % (07/01 0952) Weight:  [90.719 kg (200 lb)] 90.719 kg (200 lb) (06/30 1110)  Intake/Output from previous day: 06/30 0701 - 07/01 0700 In: 2880 [P.O.:120; I.V.:2710; IV Piggyback:50] Out: 2250 [Urine:1900; Blood:350] Intake/Output this shift: Total I/O In: 240 [P.O.:240] Out: 250 [Urine:250]   Recent Labs  10/26/15 0424  HGB 11.0*    Recent Labs  10/26/15 0424  WBC 14.6*  RBC 3.51*  HCT 32.8*  PLT 234    Recent Labs  10/26/15 0424  NA 135  K 4.3  CL 103  CO2 25  BUN 19  CREATININE 0.65  GLUCOSE 126*  CALCIUM 8.3*   No results for input(s): LABPT, INR in the last 72 hours.  Sensation intact distally Intact pulses distally Dorsiflexion/Plantar flexion intact Incision: scant drainage  Assessment/Plan: 1 Day Post-Op Procedure(s) (LRB): RIGHT TOTAL HIP ARTHROPLASTY ANTERIOR APPROACH (Right) Up with therapy  Daeveon Zweber Y 10/26/2015, 10:53 AM

## 2015-10-26 NOTE — Discharge Instructions (Signed)

## 2015-10-27 ENCOUNTER — Encounter (HOSPITAL_COMMUNITY): Payer: Self-pay | Admitting: Orthopaedic Surgery

## 2015-10-27 NOTE — Progress Notes (Signed)
Physical Therapy Treatment Patient Details Name: Octaviano GlowBrenda J Dejaynes MRN: [id number] DOB: [birth year] Today's Date: 10/27/2015    History of Present Illness [name]    PT Comments    POD # 2 pm session Assisted out of recliner to amb to bathroom then to bed only due to max c/o fatigue.  Pt required increased time and assist.  Follow Up Recommendations  SNF (pt would like to D/C to SNF due to slow progress.  Will consult LPT.  )     Equipment Recommendations       Recommendations for Other Services       Precautions / Restrictions Precautions Precautions: Fall Precaution Comments: recent back surgery Restrictions Weight Bearing Restrictions: No Other Position/Activity Restrictions: WBAT    Mobility  Bed Mobility Overal bed mobility: Needs Assistance Bed Mobility: Supine to Sit     Supine to sit: Max assist;HOB elevated     General bed mobility comments: increased, increased time and 75% VC's on proper tech as well as use of bed pad to complete scooting.  Very difficult.   Transfers Overall transfer level: Needs assistance Equipment used: Rolling walker (2 wheeled) Transfers: Sit to/from Stand           General transfer comment: elevated bed and 50% VC's on proper hand placement and turn completion as well as needed increased time.   Ambulation/Gait Ambulation/Gait assistance: Min assist Ambulation Distance (Feet): 22 Feet (11 feet x 2 to and from bathroom only.  ) Assistive device: Rolling walker (2 wheeled) Gait Pattern/deviations: Step-to pattern;Trunk flexed Gait velocity: decreased   General Gait Details: multimodal cues for sequence and position inside the RW. tends to place RW too far.  Very slow gait.     Stairs            Wheelchair Mobility    Modified Rankin (Stroke Patients Only)       Balance                                    Cognition Arousal/Alertness: Awake/alert Behavior During Therapy: WFL for tasks  assessed/performed ("worried") Overall Cognitive Status: Within Functional Limits for tasks assessed                      Exercises      General Comments        Pertinent Vitals/Pain Pain Assessment: 0-10 Pain Score: 8  Pain Location: R hip and "all over" Pain Descriptors / Indicators: Grimacing;Tender Pain Intervention(s): Monitored during session;Premedicated before session;Repositioned    Home Living                      Prior Function            PT Goals (current goals can now be found in the care plan section) Progress towards PT goals: Progressing toward goals    Frequency       PT Plan Discharge plan needs to be updated    Co-evaluation             End of Session Equipment Utilized During Treatment: Gait belt Activity Tolerance: Patient limited by fatigue;Patient limited by pain Patient left: in chair;with call bell/phone within reach;with chair alarm set     Time: 1513-1540 PT Time Calculation (min) (ACUTE ONLY): 27 min  Charges:  $Gait Training: 8-22 mins $Therapeutic Activity: 8-22 mins  G Codes:      Rica Koyanagi  PTA [facility]  Acute  Rehab Pager      [phone]

## 2015-10-27 NOTE — Clinical Social Work Placement (Signed)
   CLINICAL SOCIAL WORK PLACEMENT  NOTE  Date:  10/27/2015  Patient Details  Name: Octaviano GlowBrenda J Angeletti MRN: [id number] Date of Birth: 1950-10-25  Clinical Social Work is seeking post-discharge placement for this patient at the Skilled  Nursing Facility level of care (*CSW will initial, date and re-position this form in  chart as items are completed):  Yes   Patient/family provided with [facility] Clinical Social Work Department's list of facilities offering this level of care within the geographic area requested by the patient (or if unable, by the patient's family).  Yes   Patient/family informed of their freedom to choose among providers that offer the needed level of care, that participate in Medicare, Medicaid or managed care program needed by the patient, have an available bed and are willing to accept the patient.  Yes   Patient/family informed of [facility]'s ownership interest in [facility] and [facility], as well as of the fact that they are under no obligation to receive care at these facilities.  PASRR submitted to EDS on 10/27/15     PASRR number received on 10/27/15     Existing PASRR number confirmed on       FL2 transmitted to all facilities in geographic area requested by pt/family on 10/27/15     FL2 transmitted to all facilities within larger geographic area on       Patient informed that his/her managed care company has contracts with or will negotiate with certain facilities, including the following:            Patient/family informed of bed offers received.  Patient chooses bed at       Physician recommends and patient chooses bed at      Patient to be transferred to   on  .  Patient to be transferred to facility by       Patient family notified on   of transfer.  Name of family member notified:        PHYSICIAN Please sign FL2     Additional Comment:    _______________________________________________ Rod MaeVaughn, Sherese Heyward S, LCSW 10/27/2015, 1:27  PM

## 2015-10-27 NOTE — Progress Notes (Signed)
Physical Therapy Treatment Patient Details Name: [name] Good MRN: [id number] DOB: [birth year] Today's Date: 10/27/2015    History of Present Illness [name]    PT Comments    POD # 2 am session Assisted OOB to amb to and from bathroom only due to MAX c/o pain and fatigue.  Pt progressing slowly and would like to pursue ST Rehab at SNF.  Will consult LPT.    Follow Up Recommendations  SNF (pt would like to D/C to SNF due to slow progress.  Will consult LPT.  )  [facility] due to location   Equipment Recommendations       Recommendations for Other Services       Precautions / Restrictions Precautions Precautions: Fall Precaution Comments: recent back surgery Restrictions Weight Bearing Restrictions: No Other Position/Activity Restrictions: WBAT    Mobility  Bed Mobility Overal bed mobility: Needs Assistance Bed Mobility: Supine to Sit     Supine to sit: Max assist;HOB elevated     General bed mobility comments: increased, increased time and 75% VC's on proper tech as well as use of bed pad to complete scooting.  Very difficult.   Transfers Overall transfer level: Needs assistance Equipment used: Rolling walker (2 wheeled) Transfers: Sit to/from Stand           General transfer comment: elevated bed and 50% VC's on proper hand placement and turn completion as well as needed increased time.   Ambulation/Gait Ambulation/Gait assistance: Min assist Ambulation Distance (Feet): 22 Feet (11 feet x 2 to and from bathroom only.  ) Assistive device: Rolling walker (2 wheeled) Gait Pattern/deviations: Step-to pattern;Trunk flexed Gait velocity: decreased   General Gait Details: multimodal cues for sequence and position inside the RW. tends to place RW too far.  Very slow gait.     Stairs            Wheelchair Mobility    Modified Rankin (Stroke Patients Only)       Balance                                    Cognition  Arousal/Alertness: Awake/alert Behavior During Therapy: WFL for tasks assessed/performed ("worried") Overall Cognitive Status: Within Functional Limits for tasks assessed                      Exercises      General Comments        Pertinent Vitals/Pain Pain Assessment: 0-10 Pain Score: 8  Pain Location: R hip and "all over" Pain Descriptors / Indicators: Grimacing;Tender Pain Intervention(s): Monitored during session;Premedicated before session;Repositioned    Home Living                      Prior Function            PT Goals (current goals can now be found in the care plan section) Progress towards PT goals: Progressing toward goals    Frequency       PT Plan Discharge plan needs to be updated    Co-evaluation             End of Session Equipment Utilized During Treatment: Gait belt Activity Tolerance: Patient limited by fatigue;Patient limited by pain Patient left: in chair;with call bell/phone within reach;with chair alarm set     Time: [id number] PT Time Calculation (min) (ACUTE ONLY): 26 min  Charges:  $  Gait Training: 8-22 mins $Therapeutic Activity: 8-22 mins                    G Codes:      Rica Koyanagi  PTA [facility]  Acute  Rehab Pager      [phone]

## 2015-10-27 NOTE — Clinical Social Work Note (Signed)
Clinical Social Work Assessment  Patient Details  Name: [name] Good MRN: [id number] Date of Birth: 1950/05/15  Date of referral:  10/27/15               Reason for consult:  Facility Placement, Discharge Planning                Permission sought to share information with:  Oceanographeracility Contact Representative Permission granted to share information::     Name::        Ag[facility]  Relationship::     Contact Information:     Housing/Transportation Living arrangements for the past 2 months:  Single Family Home Source of Information:  Patient, Medical Team Patient Interpreter Needed:  None Criminal Activity/Legal Involvement Pertinent to Current Situation/Hospitalization:  No - Comment as needed Significant Relationships:  Spouse Lives with:  Spouse Do you feel safe going back to the place where you live?  Yes Need for family participation in patient care:  No (Coment) (Patient able to make own decisions)  Care giving concerns:  Patient expressed no concerns at this time.   Social Worker assessment / plan:  LCSW received referral for possible SNF placement at time of discharge. LCSW completed appropriate ref[facility], including patient's preference of [facility]. LCSW to continue to follow and assist with discharge planning needs.  Employment status:  Retired Medical illustratornsurance information:  Managed Medicare ([employer] Medicare) PT Recommendations:  Skilled Nursing Facility Information / Referral to community resources:  Skilled Nursing Facility  Patient/Family's Response to care:  Patient understanding and agreeable to [employer]CSW plan of care.  Patient/Family's Understanding of and Emotional Response to Diagnosis, Current Treatment, and Prognosis:  Patient understanding and agreeable to LCSW plan of care.  Emotional Assessment Appearance:  Appears stated age Attitude/Demeanor/Rapport:  Other (Pleasant) Affect (typically observed):   Accepting, Appropriate Orientation:  Oriented to Self, Oriented to Place, Oriented to  Time, Oriented to Situation Alcohol / Substance use:  Not Applicable Psych involvement (Current and /or in the community):  No (Comment) (Not appropriate on this admission.)  Discharge Needs  Concerns to be addressed:  No discharge needs identified Readmission within the last 30 days:  No Current discharge risk:  None Barriers to Discharge:  No Barriers Identified   Rod MaeVaughn, Charlea Nardo S, LCSW 10/27/2015, 3:26 PM
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
## 2015-10-27 NOTE — Progress Notes (Signed)
Subjective: 2 Days Post-Op Procedure(s) (LRB): RIGHT TOTAL HIP ARTHROPLASTY ANTERIOR APPROACH (Right) Patient reports pain as moderate.  OT saw her this am and has now recommended SNF placement post inpatient stay.  Objective: Vital signs in last 24 hours: Temp:  [98.3 F (36.8 C)-99.8 F (37.7 C)] 98.3 F (36.8 C) (07/02 0520) Pulse Rate:  [88-90] 88 (07/02 0520) Resp:  [16] 16 (07/02 0520) BP: (126-151)/(61-69) 126/61 mmHg (07/02 0520) SpO2:  [97 %] 97 % (07/02 0520)  Intake/Output from previous day: 07/01 0701 - 07/02 0700 In: 1080 [P.O.:480; I.V.:600] Out: 1525 [Urine:1525] Intake/Output this shift: Total I/O In: 240 [P.O.:240] Out: 300 [Urine:300]   Recent Labs  10/26/15 0424  HGB 11.0*    Recent Labs  10/26/15 0424  WBC 14.6*  RBC 3.51*  HCT 32.8*  PLT 234    Recent Labs  10/26/15 0424  NA 135  K 4.3  CL 103  CO2 25  BUN 19  CREATININE 0.65  GLUCOSE 126*  CALCIUM 8.3*   No results for input(s): LABPT, INR in the last 72 hours.  Sensation intact distally Intact pulses distally Dorsiflexion/Plantar flexion intact Incision: scant drainage  Assessment/Plan: 2 Days Post-Op Procedure(s) (LRB): RIGHT TOTAL HIP ARTHROPLASTY ANTERIOR APPROACH (Right) Up with therapy Discharge to SNF next 1-2 days if no progress with therapy.  Graydon Fofana Y 10/27/2015, 11:54 AM

## 2015-10-27 NOTE — [place] FL2 (Signed)
[place] MEDICAID FL2 LEVEL OF CARE SCREENING TOOL     IDENTIFICATION  Patient Name: [name] Good Birthdate: [birth year] Sex: female Admission Date (Current Location): 10/25/2015  [facility] [place]   Facility and Address:  [facility],  [address], [place] [id number]      Provider Number: [id number]  Attending Physician Name and Address:  Kathryne Hitchhristopher Y Blackman, *  Relative Name and Phone Number:       Current Level of Care: Hospital Recommended Level of Care: Skilled Nursing Facility Prior Approval Number:    Date Approved/Denied:   PASRR Number: [id number]  Discharge Plan: SNF    Current Diagnoses: Patient Active Problem List   Diagnosis Date Noted  . Osteoarthritis of right hip 10/25/2015  . 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 S/P lumbar laminectomy 06/14/2015    Orientation RESPIRATION BLADDER Height & Weight     Self, Time, Situation, Place  Normal Continent Weight: 200 lb (90.719 kg) Height:  5' 2.5" (158.8 cm)  BEHAVIORAL SYMPTOMS/MOOD NEUROLOGICAL BOWEL NUTRITION STATUS      Continent Diet (Please see discharge summary.)  AMBULATORY STATUS COMMUNICATION OF NEEDS Skin   Limited Assist Verbally Surgical wounds                       Personal Care Assistance Level of Assistance  Bathing, Feeding, Dressing Bathing Assistance: Limited assistance Feeding assistance: Independent Dressing Assistance: Limited assistance     Functional Limitations Info             SPECIAL CARE FACTORS FREQUENCY  PT (By licensed PT), OT (By licensed OT)     PT Frequency: 5 OT Frequency: 5            Contractures      Additional Factors Info  Code Status, Allergies Code Status Info: FULL Allergies Info: Amoxicillin           Current Medications (10/27/2015):  This is the current hospital active medication list Current Facility-Administered Medications  Medication Dose Route Frequency  Provider Last Rate Last Dose  . acetaminophen (TYLENOL) tablet 650 mg  650 mg Oral Q6H PRN Kathryne Hitchhristopher Y Blackman, MD   650 mg at 10/26/15 1758   Or  . acetaminophen (TYLENOL) suppository 650 mg  650 mg Rectal Q6H PRN Kathryne Hitchhristopher Y Blackman, MD      . alum & mag hydroxide-simeth (MAALOX/MYLANTA) 200-200-20 MG/5ML suspension 30 mL  30 mL Oral Q4H PRN Kathryne Hitchhristopher Y Blackman, MD      . aspirin EC tablet 325 mg  325 mg Oral BID PC Kathryne Hitchhristopher Y Blackman, MD   325 mg at 10/27/15 0843  . calcium carbonate (TUMS - dosed in mg elemental calcium) chewable tablet 200 mg of elemental calcium  1 tablet Oral BID PRN Kathryne Hitchhristopher Y Blackman, MD      . diphenhydrAMINE (BENADRYL) 12.5 MG/5ML elixir 12.5-25 mg  12.5-25 mg Oral Q4H PRN Kathryne Hitchhristopher Y Blackman, MD      . docusate sodium (COLACE) capsule 100 mg  100 mg Oral BID Kathryne Hitchhristopher Y Blackman, MD   100 mg at 10/27/15 0913  . HYDROmorphone (DILAUDID) injection 1 mg  1 mg Intravenous Q2H PRN Kathryne Hitchhristopher Y Blackman, MD   1 mg at 10/25/15 2244  . menthol-cetylpyridinium (CEPACOL) lozenge 3 mg  1 lozenge Oral PRN Kathryne Hitchhristopher Y Blackman, MD       Or  . phenol (CHLORASEPTIC) mouth spray 1 spray  1 spray Mouth/Throat  PRN Kathryne Hitchhristopher Y Blackman, MD      . methocarbamol (ROBAXIN) tablet 500 mg  500 mg Oral Q6H PRN Kathryne Hitchhristopher Y Blackman, MD   500 mg at 10/27/15 0743   Or  . methocarbamol (ROBAXIN) 500 mg in dextrose 5 % 50 mL IVPB  500 mg Intravenous Q6H PRN Kathryne Hitchhristopher Y Blackman, MD   500 mg at 10/25/15 1501  . metoCLOPramide (REGLAN) tablet 5-10 mg  5-10 mg Oral Q8H PRN Kathryne Hitchhristopher Y Blackman, MD       Or  . metoCLOPramide (REGLAN) injection 5-10 mg  5-10 mg Intravenous Q8H PRN Kathryne Hitchhristopher Y Blackman, MD      . ondansetron [facility](ZOFRAN) tablet 4 mg  4 mg Oral Q6H PRN Kathryne Hitchhristopher Y Blackman, MD       Or  . ondansetron [facility](ZOFRAN) injection 4 mg  4 mg Intravenous Q6H PRN Kathryne Hitchhristopher Y Blackman, MD      . oxyCODONE (Oxy IR/ROXICODONE) immediate release tablet 5-15 mg  5-15 mg  Oral Q3H PRN Kathryne Hitchhristopher Y Blackman, MD   15 mg at 10/27/15 1045  . pantoprazole (PROTONIX) EC tablet 40 mg  40 mg Oral Daily Kathryne Hitchhristopher Y Blackman, MD   40 mg at 10/27/15 0912  . polyethylene glycol (MIRALAX / GLYCOLAX) packet 17 g  17 g Oral Daily PRN Kathryne Hitchhristopher Y Blackman, MD      . predniSONE (DELTASONE) tablet 15 mg  15 mg Oral Daily Kathryne Hitchhristopher Y Blackman, MD   15 mg at 10/27/15 0913  . rOPINIRole (REQUIP) tablet 2 mg  2 mg Oral QHS Kathryne Hitchhristopher Y Blackman, MD   2 mg at 10/26/15 2210  . sertraline (ZOLOFT) tablet 100 mg  100 mg Oral Daily Kathryne Hitchhristopher Y Blackman, MD   100 mg at 10/27/15 0913  . zolpidem (AMBIEN) tablet 5 mg  5 mg Oral QHS PRN Kathryne Hitchhristopher Y Blackman, MD         Discharge Medications: Please see discharge summary for a list of discharge medications.  Relevant Imaging Results:  Relevant Lab Results:   Additional Information SSN: [id number]  Rod MaeVaughn, Emily S, LCSW

## 2015-10-28 MED ORDER — METHOCARBAMOL 500 MG PO TABS: 500.0000 mg | ORAL_TABLET | Freq: Four times a day (QID) | ORAL | Status: DC | PRN

## 2015-10-28 MED ORDER — ASPIRIN 325 MG PO TBEC: 325.0000 mg | DELAYED_RELEASE_TABLET | Freq: Two times a day (BID) | ORAL | Status: DC

## 2015-10-28 MED ORDER — OXYCODONE-ACETAMINOPHEN 10-325 MG PO TABS: 1.0000 | ORAL_TABLET | ORAL | Status: DC | PRN

## 2015-10-28 NOTE — Clinical Social Work Placement (Addendum)
   CLINICAL SOCIAL WORK PLACEMENT  NOTE  Date:  10/28/2015  Patient Details  Name: [name] Good MRN: [id number] Date of Birth: May 01, 1950  Clinical Social Work is seeking post-discharge placement for this patient at the Skilled  Nursing Facility level of care (*CSW will initial, date and re-position this form in  chart as items are completed):  Yes   Patient/family provided with [place] Clinical Social Work Department's list of facilities offering this level of care within the geographic area requested by the patient (or if unable, by the patient's family).  Yes   Patient/family informed of their freedom to choose among providers that offer the needed level of care, that participate in Medicare, Medicaid or managed care program needed by the patient, have an available bed and are willing to accept the patient.  Yes   Patient/family informed of [facility]'s ownership interest in [facility] and [facility], as well as of the fact that they are under no obligation to receive care at these facilities.  PASRR submitted to EDS on 10/27/15     PASRR number received on 10/27/15     Existing PASRR number confirmed on       FL2 transmitted to all facilities in geographic area requested by pt/family on 10/27/15     FL2 transmitted to all facilities within larger geographic area on       Patient informed that his/her managed care company has contracts with or will negotiate with certain facilities, including the following:        Yes   Patient/family informed of bed offers received.  Patient chooses bed at [facility] [place]     Physician recommends and patient chooses bed at      Patient to be transferred to [facility] [place] on 10/28/15.  Patient to be transferred to facility by FAMILY     Patient family notified on 10/28/15 of transfer.  Name of family member notified:  Pt contacted family independently.     PHYSICIAN Please sign FL2     Additional  Comment: Pt is in agreement with d/c to [facility] today. PT approved transport by car. D/C Summary sent to SNF for review. Scripts included in d/c packet. # for report provided to nsg. D/C packet provided to pt.   _______________________________________________ Royetta AsalHaidinger, Odis Turck Lee, LCSW  [phone] 10/28/2015, 3:01 PM

## 2015-10-28 NOTE — Progress Notes (Signed)
[facility] [place] [facility]

## 2015-10-28 NOTE — Progress Notes (Signed)
Patient ID: [name] Good, female   DOB: [birth year], 65 y.o.   MRN: [id number] Doing better each day.  Vitals stable.  Right hip stable.  Can be discharged to skilled nursing today.

## 2015-10-28 NOTE — Discharge Summary (Signed)
Patient ID: [name] Good MRN: [id number] DOB/AGE: [birth year] 65 y.o.  Admit date: 10/25/2015 Discharge date: 10/28/2015  Admission Diagnoses:  Principal Problem:   Osteoarthritis of right hip Active Problems:   Status post total replacement of right hip   Discharge Diagnoses:  Same  Past Medical History  Diagnosis Date  . Myocardial infarction (HCC) 89    no visits to cardiac dr(thomas kelly) since 89  . Depression   . Arthritis   . Wound dehiscence     lumbar  . GERD (gastroesophageal reflux disease)     Surgeries: Procedure(s): RIGHT TOTAL HIP ARTHROPLASTY ANTERIOR APPROACH on 10/25/2015   Consultants:    Discharged Condition: Improved  Hospital Course: [name] Good is an 65 y.o. female who was admitted 10/25/2015 for operative treatment ofOsteoarthritis of right hip. Patient has severe unremitting pain that affects sleep, daily activities, and work/hobbies. After pre-op clearance the patient was taken to the operating room on 10/25/2015 and underwent  Procedure(s): RIGHT TOTAL HIP ARTHROPLASTY ANTERIOR APPROACH.    Patient was given perioperative antibiotics: Anti-infectives    Start     Dose/Rate Route Frequency Ordered Stop   10/25/15 1830  clindamycin (CLEOCIN) IVPB 600 mg     600 mg 100 mL/hr over 30 Minutes Intravenous Every 6 hours 10/25/15 1543 10/26/15 0137   10/25/15 1022  clindamycin (CLEOCIN) IVPB 900 mg     900 mg 100 mL/hr over 30 Minutes Intravenous On call to O.R. 10/25/15 1022 10/25/15 1317       Patient was given sequential compression devices, early ambulation, and chemoprophylaxis to prevent DVT.  Patient benefited maximally from hospital stay and there were no complications.    Recent vital signs: Patient Vitals for the past 24 hrs:  BP Temp Temp src Pulse Resp SpO2  10/28/15 0556 129/61 mmHg 98 F (36.7 C) Oral 86 16 98 %  10/27/15 2155 (!) 123/54 mmHg 98.6 F (37 C) Oral 91 16 99 %  10/27/15 1425 (!) 113/55 mmHg 98 F (36.7 C) Oral  80 16 98 %     Recent laboratory studies:  Recent Labs  10/26/15 0424  WBC 14.6*  HGB 11.0*  HCT 32.8*  PLT 234  NA 135  K 4.3  CL 103  CO2 25  BUN 19  CREATININE 0.65  GLUCOSE 126*  CALCIUM 8.3*     Discharge Medications:     Medication List    STOP taking these medications        oxyCODONE 5 MG immediate release tablet  Commonly known as:  Oxy IR/ROXICODONE      TAKE these medications        aspirin 325 MG EC tablet  Take 1 tablet (325 mg total) by mouth 2 (two) times daily after a meal.     calcium carbonate 750 MG chewable tablet  Commonly known as:  TUMS EX  Chew 1 tablet by mouth 2 (two) times daily as needed for heartburn.     methocarbamol 500 MG tablet  Commonly known as:  ROBAXIN  Take 1 tablet (500 mg total) by mouth every 6 (six) hours as needed for muscle spasms.     omeprazole 20 MG capsule  Commonly known as:  PRILOSEC  Take 20 mg by mouth daily as needed (for acid reflux).     oxyCODONE-acetaminophen 10-325 MG tablet  Commonly known as:  PERCOCET  Take 1 tablet by mouth every 4 (four) hours as needed for pain.     phentermine 37.5 MG  tablet  Commonly known as:  ADIPEX-P  Take 37.5 mg by mouth daily before breakfast.     predniSONE 10 MG tablet  Commonly known as:  DELTASONE  Take 15 mg by mouth daily.     pseudoephedrine 30 MG tablet  Commonly known as:  SUDAFED  Take 30 mg by mouth daily as needed for congestion.     rOPINIRole 2 MG tablet  Commonly known as:  REQUIP  Take 2 mg by mouth at bedtime.     SALONPAS Pads  Apply 1 each topically daily.     sertraline 100 MG tablet  Commonly known as:  ZOLOFT  Take 100 mg by mouth daily.     sodium chloride 0.65 % Soln nasal spray  Commonly known as:  OCEAN  Place 1 spray into both nostrils as needed for congestion.        Diagnostic Studies: Dg C-arm 1-60 Min-no Report  10/25/2015  CLINICAL DATA: surgery C-ARM 1-60 MINUTES Fluoroscopy was utilized by the requesting  physician.  No radiographic interpretation.   Dg Hip Port Unilat With Pelvis 1v Right  10/25/2015  CLINICAL DATA:  Status post right total hip replacement EXAM: DG HIP (WITH OR WITHOUT PELVIS) 2V PORT RIGHT COMPARISON:  Intraoperative images October 25, 2015 FINDINGS: Frontal pelvis as well as cross-table lateral right hip images were obtained. There is a total hip prosthesis on the right with prosthetic components appearing well-seated. No fracture or dislocation evident. The left hip joint appears unremarkable. Soft tissue air on the right is an expected postoperative finding. IMPRESSION: Total hip replacement on the right with prosthetic components well-seated. No acute fracture or dislocation. Left hip joint appears unremarkable. Electronically Signed   By: Bretta BangWilliam  Woodruff III M.D.   On: 10/25/2015 14:51   Dg Hip Operative Unilat With Pelvis Right  10/25/2015  CLINICAL DATA:  Right total hip arthroplasty. EXAM: OPERATIVE right HIP (WITH PELVIS IF PERFORMED) 2 VIEWS TECHNIQUE: Fluoroscopic spot image(s) were submitted for interpretation post-operatively. COMPARISON:  None. FINDINGS: The examination demonstrates evidence of patient's right total hip arthroplasty with prosthetic components intact and normally located. Recommend correlation findings at the time of the procedure. IMPRESSION: Expected changes post right total hip arthroplasty. Electronically Signed   By: Elberta Fortisaniel  Boyle M.D.   On: 10/25/2015 14:24    Disposition: to skilled nursing facility      Discharge Instructions    Call MD / Call 911    Complete by:  As directed   If you experience chest pain or shortness of breath, CALL 911 and be transported to the hospital emergency room.  If you develope a fever above 101 F, pus (white drainage) or increased drainage or redness at the wound, or calf pain, call your surgeon's office.     Constipation Prevention    Complete by:  As directed   Drink plenty of fluids.  Prune juice may be helpful.   You may use a stool softener, such as Colace (over the counter) 100 mg twice a day.  Use MiraLax (over the counter) for constipation as needed.     Diet - low sodium heart healthy    Complete by:  As directed      Discharge patient    Complete by:  As directed      Increase activity slowly as tolerated    Complete by:  As directed            Follow-up Information    Follow up with Kathryne HitchBLACKMAN,Brittan Butterbaugh Y,  MD In 2 weeks.   Specialty:  Orthopedic Surgery   Contact information:   [address] [place] [address] [phone]        Signed: Kathryne HitchBLACKMAN,Montanna Mcbain Y 10/28/2015, 7:03 AM

## 2015-10-28 NOTE — Progress Notes (Signed)
Physical Therapy Treatment Patient Details Name: [name] Good MRN: [id number] DOB: April 29, 1950 Today's Date: 10/28/2015    History of Present Illness [name]    PT Comments    POD # 3 Assisted with amb a greater distance then to bathroom then performed some THR TE's followed by ICE.   Follow Up Recommendations  SNF     Equipment Recommendations  None recommended by PT    Recommendations for Other Services       Precautions / Restrictions Precautions Precautions: Fall Precaution Comments: recent back surgery Restrictions Weight Bearing Restrictions: No Other Position/Activity Restrictions: WBAT    Mobility  Bed Mobility               General bed mobility comments: Pt OOB in recliner  Transfers Overall transfer level: Needs assistance Equipment used: Rolling walker (2 wheeled) Transfers: Sit to/from Stand Sit to Stand: Min guard;Min assist         General transfer comment: increased time and 25% VC's on proper tech and hand placement   Ambulation/Gait Ambulation/Gait assistance: Min guard;Min assist Ambulation Distance (Feet): 25 Feet Assistive device: Rolling walker (2 wheeled) Gait Pattern/deviations: Step-to pattern Gait velocity: decreased   General Gait Details: multimodal cues for sequence and position inside the RW. tends to place RW too far.  Very slow gait.     Stairs            Wheelchair Mobility    Modified Rankin (Stroke Patients Only)       Balance                                    Cognition Arousal/Alertness: Awake/alert Behavior During Therapy: WFL for tasks assessed/performed Overall Cognitive Status: Within Functional Limits for tasks assessed                      Exercises   Total Hip Replacement TE's 10 reps ankle pumps 10 reps knee presses 10 reps heel slides 10 reps SAQ's 10 reps ABD Followed by ICE    General Comments        Pertinent Vitals/Pain Pain Assessment:  0-10 Pain Score: 4  Pain Location: R hip Pain Descriptors / Indicators: Discomfort;Tender Pain Intervention(s): Monitored during session;Premedicated before session;Repositioned;Ice applied    Home Living                      Prior Function            PT Goals (current goals can now be found in the care plan section) Progress towards PT goals: Progressing toward goals    Frequency  7X/week    PT Plan Discharge plan needs to be updated    Co-evaluation             End of Session Equipment Utilized During Treatment: Gait belt Activity Tolerance: Patient limited by fatigue;Patient limited by pain Patient left: in chair;with call bell/phone within reach;with chair alarm set     Time: [id number] PT Time Calculation (min) (ACUTE ONLY): 38 min  Charges:  $Gait Training: 8-22 mins $Therapeutic Exercise: 8-22 mins $Therapeutic Activity: 8-22 mins                    G Codes:      Felecia ShellingLori Arlie Posch  PTA [facility]  Acute  Rehab Pager      [phone]

## 2016-01-28 ENCOUNTER — Ambulatory Visit (INDEPENDENT_AMBULATORY_CARE_PROVIDER_SITE_OTHER): Payer: Medicare Other | Admitting: Orthopaedic Surgery

## 2016-01-28 DIAGNOSIS — M1611 Unilateral primary osteoarthritis, right hip: Secondary | ICD-10-CM

## 2016-07-15 NOTE — Progress Notes (Signed)
Office Visit Note  Patient: [name] Good             Date of Birth: 17-Aug-1950           MRN: [id number]             PCP: Leonard Downing, MD Referring: Leonard Downing, * Visit Date: 07/17/2016 Occupation: '@GUAROCC'$ @    Subjective:  Pain in multiple joints.   History of Present Illness: [name] Good is a 66 y.o. female seen in consultation per request of her PCP. According to patient she started having pain and swelling in her hands about 4 years ago. She states about 2 years ago she started having pain in her right hip and bilateral knee joints. She was seen by Dr. Alfonso Ramus who started giving her cortisone injections. She was also having lower back pain for which she was seen by Dr. Ronnald Ramp and underwent discectomy. She stated helped her lower back to some extent. After the back surgery her right hip joint pain persist and she had to undergo right total hip replacement on 10/25/2015. She states she was started on prednisone by her PCP about 2 years ago for ongoing joint pain and discomfort. While she was in the nursing home after the total hip replacement the try to taper her off prednisone and started on pain medications. Gradually the pain medications were tapered off. She states once she reached home she started having increased pain in her left hip joint and left knee joint. She was also told that her left leg is shorter than the right leg postoperatively. She states she was in a lot of discomfort and her PCP prescribed prednisone 5 mg every other day in October 2017. By December her symptoms got worse and in January her prednisone was increased to 10 mg every other day which she has been taking. She states she continues to have discomfort in all of her joints she still is having pain and swelling in her bilateral hands.  Activities of Daily Living:  Patient reports morning stiffness for1 hour.   Patient Denies nocturnal pain.  Difficulty dressing/grooming: Denies Difficulty  climbing stairs: Reports Difficulty getting out of chair: Reports Difficulty using hands for taps, buttons, cutlery, and/or writing: Denies   Review of Systems  Constitutional: Positive for fatigue. Negative for night sweats, weight gain, weight loss and weakness.  HENT: Negative for mouth sores, trouble swallowing, trouble swallowing, mouth dryness and nose dryness.   Eyes: Negative for pain, redness, visual disturbance and dryness.  Respiratory: Negative for cough, shortness of breath and difficulty breathing.   Cardiovascular: Negative for chest pain, palpitations, hypertension, irregular heartbeat and swelling in legs/feet.  Gastrointestinal: Negative for abdominal pain, blood in stool, constipation and diarrhea.  Endocrine: Negative for increased urination.  Genitourinary: Negative for vaginal dryness.  Musculoskeletal: Positive for arthralgias, joint pain, joint swelling, myalgias, morning stiffness and myalgias. Negative for muscle weakness and muscle tenderness.  Skin: Negative for color change, rash, hair loss, skin tightness, ulcers and sensitivity to sunlight.  Allergic/Immunologic: Negative for susceptible to infections.  Neurological: Negative for dizziness, memory loss and night sweats.  Hematological: Negative for swollen glands.  Psychiatric/Behavioral: Positive for depressed mood and sleep disturbance. The patient is nervous/anxious.     PMFS History:  Patient Active Problem List   Diagnosis Date Noted  . Osteoarthritis of right hip 10/25/2015  . 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 S/P lumbar laminectomy 06/14/2015    Past Medical History:  Diagnosis Date  . Arthritis   . Depression   . GERD (gastroesophageal reflux disease)   . Myocardial infarction 91   no visits to cardiac dr(thomas kelly) since 92  . Wound dehiscence    lumbar    History reviewed. No pertinent family history. Past Surgical History:  Procedure Laterality Date  .  APPENDECTOMY     17 yrs  . BREAST SURGERY Left    cyst  . CARDIAC CATHETERIZATION  91  . CHOLECYSTECTOMY     66 yrs old  . LUMBAR LAMINECTOMY/DECOMPRESSION MICRODISCECTOMY N/A 06/14/2015   Procedure: Lumbar Three-Four,Lumbar Four-Five, Lumbar Five-Sacral One Laminectomy;  Surgeon: Eustace Moore, MD;  Location: [place] [facility];  Service: Neurosurgery;  Laterality: N/A;  . LUMBAR WOUND DEBRIDEMENT N/A 07/24/2015   Procedure: lumbar wound revision;  Surgeon: Eustace Moore, MD;  Location: [place] [facility];  Service: Neurosurgery;  Laterality: N/A;  . TOTAL HIP ARTHROPLASTY Right 10/25/2015   Procedure: RIGHT TOTAL HIP ARTHROPLASTY ANTERIOR APPROACH;  Surgeon: Mcarthur Rossetti, MD;  Location: [facility];  Service: Orthopedics;  Laterality: Right;  . TUBAL LIGATION     Social History   Social History Narrative  . No narrative on file     Objective: Vital Signs: BP 138/80 (BP Location: Right Arm)   Pulse 84   Resp 18   Ht 5' 2.5" (1.588 m)   Wt 210 lb (95.3 kg)   BMI 37.80 kg/m    Physical Exam  Constitutional: She is oriented to person, place, and time. She appears well-developed and well-nourished.  HENT:  Head: Normocephalic and atraumatic.  Eyes: Conjunctivae and EOM are normal.  Neck: Normal range of motion.  Cardiovascular: Normal rate, regular rhythm, normal heart sounds and intact distal pulses.   Pulmonary/Chest: Effort normal and breath sounds normal.  Abdominal: Soft. Bowel sounds are normal.  Lymphadenopathy:    She has no cervical adenopathy.  Neurological: She is alert and oriented to person, place, and time.  Skin: Skin is warm and dry. Capillary refill takes less than 2 seconds.  Psychiatric: She has a normal mood and affect. Her behavior is normal.  Nursing note and vitals reviewed.    Musculoskeletal Exam: C-spine good range of motion she has thoracic kyphosis. Lumbar spine limited range of motion. Shoulder joints elbow joints wrist joints are good range of motion.  She has some DIP PIP thickening no synovitis over her wrist joint or MCP joints was noted. Hip joints are good range of motion she has right total hip replacement which appears to be doing well. Her knee joint extension was limited but no warmth swelling or effusion was noted. Ankle joints are good range of motion. She hammertoes without any synovitis.  CDAI Exam: No CDAI exam completed.    Investigation: Findings:  05/19/2016 ANA positive, anti-DNA antibody 15, ESR 15, C-reactive protein 5.2 elevated, RF negative, CCP negative, CBC normal, CMP normal    Imaging: Xr Foot 2 Views Left  Result Date: 07/17/2016 Minimal PIP/DIP narrowing was noted. Calcaneal spur was noted. Impression these findings are consistent with osteoarthritis of the foot  Xr Foot 2 Views Right  Result Date: 07/17/2016 Second MTP narrowing all PIP/DIP narrowing was noted. No erosive changes were noted. She has small calcaneal spur. These findings could be consistent with osteoarthritis of the foot  Xr Hand 2 View Left  Result Date: 07/17/2016 Severe CMC narrowing. Second and third MCP narrowing. Minimal PIP/DIP narrowing. No intercarpal joint space narrowing was noted. No erosive changes  were noted. Impression: These findings are consistent with osteoarthritis and MCP narrowing couldn't represent inflammatory arthritis   Xr Hand 2 View Right  Result Date: 07/17/2016 CMC narrowing. Second and third MCP narrowing. Minimal PIP/DIP narrowing. No intercarpal joint space narrowing was noted. No erosive changes were noted. Impression: These findings are consistent with osteoarthritis and MCP narrowing couldn't represent inflammatory arthritis   Xr Knee 3 View Left  Result Date: 07/17/2016 Moderate lateral compartment narrowing with osteophyte. No chondrocalcinosis was noted. She has moderate patellofemoral narrowing. Impression: Findings are consistent with moderate osteoarthritis of the knee joint and chondromalacia  patella  Xr Knee 3 View Right  Result Date: 07/17/2016 Moderate lateral compartment narrowing with osteophyte. No chondrocalcinosis was noted. She has moderate patellofemoral narrowing. Impression: Findings are consistent with moderate osteoarthritis of the knee joint and chondromalacia patella   Speciality Comments: No specialty comments available.    Procedures:  No procedures performed Allergies: Amoxicillin   Assessment / Plan:     Visit Diagnoses: Positive ANA (antinuclear antibody) - Positive double-stranded DNA. Patient has been on chronic prednisone therapy for more than 2 years now she's currently still on high-dose treatment. The diagnosis is uncertain. She does not meet the criteria for lupus. She gives history of intermittent swelling in her hands although I do not see any synovitis on examination. It could be masked because she is on prednisone. I will obtain following labs and x-rays today to evaluate this further. I would like for her to taper her prednisone to 5 mg alternating with 10 mg to see if that'll help [place] reduced her prednisone dosage.  Polyarthralgia  S/P lumbar laminectomy  Status post total replacement of right hip  Pain in both hands - Plan: XR Hand 2 View Right, XR Hand 2 View Left x-rays revealed osteoarthritic changes. She also had bilateral second and third MCP narrowing which could be associated with inflammatory arthritis. She had not but synovitis on examination today. She is on prednisone therapy which could be masking her symptoms.  Chronic pain of both knees - Plan: XR KNEE 3 VIEW RIGHT, XR KNEE 3 VIEW LEFT. X-rays reveal moderate osteoarthritis of bilateral knee joints especially of the lateral compartment and moderate chondromalacia patella.  Pain in both feet - Plan: XR Foot 2 Views Right, XR Foot 2 Views Left . X-rays revealed osteoarthritis of bilateral feet.   Orders: Orders Placed This Encounter  Procedures  . XR Hand 2 View Right  . XR  Hand 2 View Left  . XR KNEE 3 VIEW RIGHT  . XR KNEE 3 VIEW LEFT  . XR Foot 2 Views Right  . XR Foot 2 Views Left  . Urinalysis, Routine w reflex microscopic  . CK  . TSH  . Antinuclear Antib (ANA)  . [id number] ENA PANEL  . C3 and C4  . Beta-2 glycoprotein antibodies  . Cardiolipin antibodies, IgG, IgM, IgA  . Lupus anticoagulant panel  . Hepatitis C Antibody  . Hepatitis B Core Antibody, IgM  . Hepatitis B Surface AntiGEN  . Hepatitis A antibody, IgM  . Serum protein electrophoresis with reflex  . IgG, IgA, IgM  . Glucose 6 phosphate dehydrogenase   No orders of the defined types were placed in this encounter.   Face-to-face time spent with patient was 50 minutes. 50% of time was spent in counseling and coordination of care.  Follow-Up Instructions: Return for Polyarthralgia.   Bo Merino, MD  Note - This record has been created using Editor, commissioning.  Chart creation errors have been sought, but may not always  have been located. Such creation errors do not reflect on  the standard of medical care.

## 2016-07-17 ENCOUNTER — Ambulatory Visit (INDEPENDENT_AMBULATORY_CARE_PROVIDER_SITE_OTHER): Payer: Medicare Other

## 2016-07-17 ENCOUNTER — Encounter: Payer: Self-pay | Admitting: Rheumatology

## 2016-07-17 ENCOUNTER — Ambulatory Visit (INDEPENDENT_AMBULATORY_CARE_PROVIDER_SITE_OTHER): Payer: Medicare Other | Admitting: Rheumatology

## 2016-07-17 VITALS — BP 138/80 | HR 84 | Resp 18 | Ht 62.5 in | Wt 210.0 lb

## 2016-07-17 DIAGNOSIS — M79671 Pain in right foot: Secondary | ICD-10-CM | POA: Diagnosis not present

## 2016-07-17 DIAGNOSIS — Z96641 Presence of right artificial hip joint: Secondary | ICD-10-CM | POA: Diagnosis not present

## 2016-07-17 DIAGNOSIS — Z9889 Other specified postprocedural states: Secondary | ICD-10-CM

## 2016-07-17 DIAGNOSIS — M25562 Pain in left knee: Secondary | ICD-10-CM

## 2016-07-17 DIAGNOSIS — M79641 Pain in right hand: Secondary | ICD-10-CM

## 2016-07-17 DIAGNOSIS — G8929 Other chronic pain: Secondary | ICD-10-CM

## 2016-07-17 DIAGNOSIS — Z1159 Encounter for screening for other viral diseases: Secondary | ICD-10-CM

## 2016-07-17 DIAGNOSIS — M25561 Pain in right knee: Secondary | ICD-10-CM

## 2016-07-17 DIAGNOSIS — M79672 Pain in left foot: Secondary | ICD-10-CM

## 2016-07-17 DIAGNOSIS — M79642 Pain in left hand: Secondary | ICD-10-CM | POA: Diagnosis not present

## 2016-07-17 DIAGNOSIS — R5383 Other fatigue: Secondary | ICD-10-CM

## 2016-07-17 DIAGNOSIS — R768 Other specified abnormal immunological findings in serum: Secondary | ICD-10-CM

## 2016-07-17 DIAGNOSIS — M2550 Pain in unspecified joint: Secondary | ICD-10-CM

## 2016-07-17 LAB — TSH: TSH: 1.42 mIU/L

## 2016-07-17 NOTE — Progress Notes (Signed)
Pharmacy Note Patient is currently taking prednisone 10 mg every other day.  She also reports taking ibuprofen as needed.  I reviewed with patient that use of ibuprofen with prednisone may increase the risk of GI adverse effects.  I advised patient to avoid use of ibuprofen with prednisone.  Discussed use of acetaminophen instead.  Counseled patient on the purpose, proper use, and adverse effects of acetaminophen including the maximum recommended dose of 3000 mg daily.  Patient voiced understanding.    Also discussed smoking cessation with patient.  Provided her with the information on the [facility] and advised her to call them for support in smoking cessation.    Lilla Shookachel Jacion Dismore, Pharm.D., BCPS, CPP Clinical Pharmacist Pager: [phone] Phone: [phone] 07/17/2016 11:32 AM

## 2016-07-18 LAB — URINALYSIS, MICROSCOPIC ONLY
Casts: NONE SEEN [LPF]
Crystals: NONE SEEN [HPF]
RBC / HPF: NONE SEEN RBC/HPF (ref <=2)
Squamous Epithelial / LPF: NONE SEEN [HPF] (ref <=5)
Yeast: NONE SEEN [HPF]

## 2016-07-18 LAB — URINALYSIS, ROUTINE W REFLEX MICROSCOPIC
Bilirubin Urine: NEGATIVE
Glucose, UA: NEGATIVE
Hgb urine dipstick: NEGATIVE
Ketones, ur: NEGATIVE
Nitrite: NEGATIVE
Protein, ur: NEGATIVE
Specific Gravity, Urine: 1.008 (ref 1.001–1.035)
pH: 5.5 (ref 5.0–8.0)

## 2016-07-18 LAB — HEPATITIS B CORE ANTIBODY, IGM: Hep B C IgM: NONREACTIVE

## 2016-07-18 LAB — CK: Total CK: 192 U/L — ABNORMAL HIGH (ref 7–177)

## 2016-07-18 LAB — HEPATITIS A ANTIBODY, IGM: Hep A IgM: NONREACTIVE

## 2016-07-18 LAB — HEPATITIS C ANTIBODY: HCV Ab: NEGATIVE

## 2016-07-18 LAB — HEPATITIS B SURFACE ANTIGEN: Hepatitis B Surface Ag: NEGATIVE

## 2016-07-20 LAB — IGG, IGA, IGM
IgA: 358 mg/dL (ref 81–463)
IgG (Immunoglobin G), Serum: 909 mg/dL (ref 694–1618)
IgM, Serum: 146 mg/dL (ref 48–271)

## 2016-07-20 LAB — C3 AND C4
C3 Complement: 191 mg/dL (ref 83–193)
C4 Complement: 33 mg/dL (ref 15–57)

## 2016-07-20 LAB — CP5000020 ENA PANEL
ENA SM Ab Ser-aCnc: <1
Ribonucleic Protein(ENA) Antibody, IgG: <1
SSA (Ro) (ENA) Antibody, IgG: <1
SSB (La) (ENA) Antibody, IgG: <1
Scleroderma (Scl-70) (ENA) Antibody, IgG: <1
ds DNA Ab: 15 IU/mL — ABNORMAL HIGH

## 2016-07-20 LAB — CARDIOLIPIN ANTIBODIES, IGG, IGM, IGA
Anticardiolipin IgA: <11 [APL'U]
Anticardiolipin IgG: <14 [GPL'U]
Anticardiolipin IgM: 36 [MPL'U] — ABNORMAL HIGH

## 2016-07-20 LAB — GLUCOSE 6 PHOSPHATE DEHYDROGENASE: G-6PDH: 20.4 U/g Hgb (ref 7.0–20.5)

## 2016-07-21 LAB — PROTEIN ELECTROPHORESIS, SERUM, WITH REFLEX
Albumin ELP: 4 g/dL (ref 3.8–4.8)
Alpha-1-Globulin: 0.4 g/dL — ABNORMAL HIGH (ref 0.2–0.3)
Alpha-2-Globulin: 0.8 g/dL (ref 0.5–0.9)
Beta 2: 0.4 g/dL (ref 0.2–0.5)
Beta Globulin: 0.5 g/dL (ref 0.4–0.6)
Gamma Globulin: 0.9 g/dL (ref 0.8–1.7)
Total Protein, Serum Electrophoresis: 7 g/dL (ref 6.1–8.1)

## 2016-07-21 LAB — RFX DRVVT SCR W/RFLX CONF 1:1 MIX: dRVVT Screen: 33 s (ref <=45)

## 2016-07-21 LAB — RFX PTT-LA W/RFX TO HEX PHASE CONF: PTT-LA Screen: 33 s (ref <=40)

## 2016-07-21 LAB — BETA-2 GLYCOPROTEIN ANTIBODIES
Beta-2 Glyco I IgG: <9 SGU (ref <=20)
Beta-2-Glycoprotein I IgA: <9 SAU (ref <=20)
Beta-2-Glycoprotein I IgM: <9 SMU (ref <=20)

## 2016-07-21 LAB — IFE INTERPRETATION

## 2016-07-21 LAB — LUPUS ANTICOAGULANT PANEL

## 2016-07-21 LAB — ANA: Anti Nuclear Antibody(ANA): NEGATIVE

## 2016-07-22 NOTE — Progress Notes (Signed)
Please ignore previous statement. It was entered in error. Labs c/e SLE. Will start on PLQ on return visit.

## 2016-07-22 NOTE — Progress Notes (Signed)
Patient should discontinue methotrexate. Repeat LFTs and TPMT in 2 weeks if it's still elevated will refer her to GI

## 2016-07-23 ENCOUNTER — Telehealth (INDEPENDENT_AMBULATORY_CARE_PROVIDER_SITE_OTHER): Payer: Self-pay | Admitting: Rheumatology

## 2016-07-23 NOTE — Telephone Encounter (Signed)
Amy @ Dr. [facility] [place] office called, needs labs from 3/23 visit faxed. I faxed [phone]

## 2016-08-12 DIAGNOSIS — M19071 Primary osteoarthritis, right ankle and foot: Secondary | ICD-10-CM | POA: Insufficient documentation

## 2016-08-12 DIAGNOSIS — E669 Obesity, unspecified: Secondary | ICD-10-CM | POA: Insufficient documentation

## 2016-08-12 DIAGNOSIS — M170 Bilateral primary osteoarthritis of knee: Secondary | ICD-10-CM | POA: Insufficient documentation

## 2016-08-12 DIAGNOSIS — M359 Systemic involvement of connective tissue, unspecified: Secondary | ICD-10-CM | POA: Insufficient documentation

## 2016-08-12 DIAGNOSIS — M19072 Primary osteoarthritis, left ankle and foot: Secondary | ICD-10-CM

## 2016-08-12 NOTE — Progress Notes (Signed)
Office Visit Note  Patient: [name] Good             Date of Birth: [birth year]           MRN: [id number]             PCP: Kaleen Mask, MD Referring: Kaleen Mask, * Visit Date: 08/18/2016 Occupation: @    Subjective:  Pain in knees and feet   History of Present Illness: [name] Good is a 66 y.o. female with history of autoimmune disease. She's been on prednisone for multiple years at high-dose. After her last visit she tried to taper her prednisone to 5 alternating with 10 mg. She states she could not tolerate that and went back to 10 mg by mouth daily. She states when she was on the lower dose of prednisone she was having pain in her hips her knees and her feet. She denies any history of joint swelling. Patient reports she had bronchitis after the last visit and was treated with Z-Pak.  Activities of Daily Living:  Patient reports morning stiffness for 30 minutes.   Patient Reports nocturnal pain.  Difficulty dressing/grooming: Denies Difficulty climbing stairs: Reports Difficulty getting out of chair: Reports Difficulty using hands for taps, buttons, cutlery, and/or writing: Denies   Review of Systems  Constitutional: Positive for fatigue. Negative for night sweats, weight gain, weight loss and weakness.  HENT: Negative for mouth sores, trouble swallowing, trouble swallowing, mouth dryness and nose dryness.   Eyes: Negative for pain, redness, visual disturbance and dryness.  Respiratory: Negative for cough, shortness of breath and difficulty breathing.   Cardiovascular: Negative for chest pain, palpitations, hypertension, irregular heartbeat and swelling in legs/feet.  Gastrointestinal: Negative for blood in stool, constipation and diarrhea.  Endocrine: Negative for increased urination.  Genitourinary: Negative for vaginal dryness.  Musculoskeletal: Positive for arthralgias, joint pain, joint swelling and morning stiffness. Negative for myalgias,  muscle weakness, muscle tenderness and myalgias.  Skin: Negative for color change, rash, hair loss, skin tightness, ulcers and sensitivity to sunlight.  Allergic/Immunologic: Negative for susceptible to infections.  Neurological: Negative for dizziness, memory loss and night sweats.  Hematological: Negative for swollen glands.  Psychiatric/Behavioral: Positive for depressed mood and sleep disturbance. The patient is not nervous/anxious.     PMFS History:  Patient Active Problem List   Diagnosis Date Noted  . Nontraumatic incomplete tear of right rotator cuff 08/18/2016  . Autoimmune disease (HCC) 08/12/2016  . Primary osteoarthritis of both knees 08/12/2016  . Primary osteoarthritis of both feet 08/12/2016  . Obesity (BMI 35.0-39.9 without comorbidity) 08/12/2016  . Osteoarthritis of right hip 10/25/2015  . 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 S/P lumbar laminectomy 06/14/2015    Past Medical History:  Diagnosis Date  . Arthritis   . Depression   . GERD (gastroesophageal reflux disease)   . Myocardial infarction (HCC) 91   no visits to cardiac dr(thomas kelly) since 92  . Wound dehiscence    lumbar    History reviewed. No pertinent family history. Past Surgical History:  Procedure Laterality Date  . APPENDECTOMY     17 yrs  . BREAST SURGERY Left    cyst  . CARDIAC CATHETERIZATION  91  . CHOLECYSTECTOMY     67 yrs old  . LUMBAR LAMINECTOMY/DECOMPRESSION MICRODISCECTOMY N/A 06/14/2015   Procedure: Lumbar Three-Four,Lumbar Four-Five, Lumbar Five-Sacral One Laminectomy;  Surgeon: Tia Alert, MD;  Location: [facility];  Service: Neurosurgery;  Laterality: N/A;  .  LUMBAR WOUND DEBRIDEMENT N/A 07/24/2015   Procedure: lumbar wound revision;  Surgeon: Tia Alert, MD;  Location: [facility];  Service: Neurosurgery;  Laterality: N/A;  . TOTAL HIP ARTHROPLASTY Right 10/25/2015   Procedure: RIGHT TOTAL HIP ARTHROPLASTY ANTERIOR APPROACH;  Surgeon: Kathryne Hitch, MD;  Location: [facility];  Service: Orthopedics;  Laterality: Right;  . TUBAL LIGATION     Social History   Social History Narrative  . No narrative on file     Objective: Vital Signs: BP (!) 160/73   Pulse (!) 102   Resp 14   Ht  (1.6 m)   Wt 201 lb (91.2 kg)   BMI 35.61 kg/m    Physical Exam  Constitutional: She is oriented to person, place, and time. She appears well-developed and well-nourished.  HENT:  Head: Normocephalic and atraumatic.  Eyes: Conjunctivae and EOM are normal.  Neck: Normal range of motion.  Cardiovascular: Normal rate, regular rhythm, normal heart sounds and intact distal pulses.   Pulmonary/Chest: Effort normal and breath sounds normal.  Abdominal: Soft. Bowel sounds are normal.  Lymphadenopathy:    She has no cervical adenopathy.  Neurological: She is alert and oriented to person, place, and time.  Skin: Skin is warm and dry. Capillary refill takes 2 to 3 seconds.  Psychiatric: She has a normal mood and affect. Her behavior is normal.  Nursing note and vitals reviewed.    Musculoskeletal Exam: C-spine and thoracic spine good range of motion. She has some thoracic kyphosis and some limitation of range of motion of her lumbar spine. Right shoulder joint has limited painful range of motion. Left shoulder joint is full range of motion. Elbow joints wrist joints are good range of motion. She has no synovitis or synovial thickening over her MCP joints but she has mild ulnar deviation. She has DIP thickening bilaterally consistent with osteoarthritis. Hip joints knee joints ankles MTPs PIPs with good range of motion with no synovitis. Her right knee joint extension was limited and right hip joint which is replaced has limited range of motion. She discomfort with range of motion of her left knee joint without any warmth swelling or effusion.  CDAI Exam: No CDAI exam completed.    Investigation: Findings:  07/17/2016 CK 192, TSH normal, UA  negative, ANA negative, DS DNA 15 positive, rest of the ENA negative, C3-C4 normal, beta-2 negative, lupus anticoagulant negative, anticardiolipin IgM 36, G6PD normal, IFE normal, immunoglobulins normal, hepatitis panel negative    Imaging: No results found.  Speciality Comments: No specialty comments available.    Procedures:  No procedures performed Allergies: Amoxicillin   Assessment / Plan:     Visit Diagnoses: Autoimmune disease (HCC) - Positive ANA, positive anti-DNA, history of arthritis, chronic prednisone since 2016. She is on prednisone 10 mg by mouth daily. We tried to taper her prednisone which she states she could not tolerate it due to increased joint pain and discomfort. She does not have any synovitis on examination today. Her most recent labs showed ANA was negative better DS DNA was still positive and she has positive anti-DNA. We discussed different treatment options and their side effects and would like to start her on Plaquenil if tolerated and then try to taper her prednisone. The plan is to start her on Plaquenil 200 mg by mouth twice a day Monday to Friday. Informed consent was taken today. She will need eye exam and  then yearly. Lab work in a month and then in  3 months. She could not tolerate prednisone taper we will try to taper it by 1 mg every month.  Pain in both hands: Chronic  Status post total replacement of right hip: Some limitation of range of motion  Primary osteoarthritis of both knees - Moderate with moderate chondromalacia patella  Primary osteoarthritis of both feet: Proper fitting shoes were discussed.  S/P lumbar laminectomy: She has limitation of range of motion in chronic discomfort  Obesity (BMI 35.0-39.9 without comorbidity) : Weight loss diet and exercise was discussed.  History of his smoking: Patient states she stopped smoking cigarettes several years ago which she's using electronic cigarettes.   Orders: Orders Placed This Encounter   Procedures  . CBC with Differential/Platelet  . COMPLETE METABOLIC PANEL WITH GFR   Meds ordered this encounter  Medications  . hydroxychloroquine (PLAQUENIL) 200 MG tablet    Sig: Take 1 tablet (200 mg total) by mouth 2 (two) times daily. Monday through Friday    Dispense:  40 tablet    Refill:  2  . predniSONE (DELTASONE) 5 MG tablet    Sig: Take 1 tablet (5 mg total) by mouth daily with breakfast.    Dispense:  30 tablet    Refill:  1  . predniSONE (DELTASONE) 1 MG tablet    Sig: Take 4 mg daily along with 5 mg tablet for total dose of 9 mg, then continue to taper as instructioned    Dispense:  120 tablet    Refill:  0    Face-to-face time spent with patient was 30 minutes. 50% of time was spent in counseling and coordination of care.  Follow-Up Instructions: Return in about 3 months (around 11/17/2016) for Autoimmune disease.   Pollyann Savoy, MD  Note - This record has been created using Animal nutritionist.  Chart creation errors have been sought, but may not always  have been located. Such creation errors do not reflect on  the standard of medical care.

## 2016-08-18 ENCOUNTER — Ambulatory Visit (INDEPENDENT_AMBULATORY_CARE_PROVIDER_SITE_OTHER): Payer: Medicare Other | Admitting: Rheumatology

## 2016-08-18 ENCOUNTER — Encounter: Payer: Self-pay | Admitting: Rheumatology

## 2016-08-18 VITALS — BP 160/73 | HR 102 | Resp 14 | Ht 63.0 in | Wt 201.0 lb

## 2016-08-18 DIAGNOSIS — E669 Obesity, unspecified: Secondary | ICD-10-CM

## 2016-08-18 DIAGNOSIS — Z96641 Presence of right artificial hip joint: Secondary | ICD-10-CM

## 2016-08-18 DIAGNOSIS — D8989 Other specified disorders involving the immune mechanism, not elsewhere classified: Secondary | ICD-10-CM | POA: Diagnosis not present

## 2016-08-18 DIAGNOSIS — M19072 Primary osteoarthritis, left ankle and foot: Secondary | ICD-10-CM | POA: Diagnosis not present

## 2016-08-18 DIAGNOSIS — M359 Systemic involvement of connective tissue, unspecified: Secondary | ICD-10-CM

## 2016-08-18 DIAGNOSIS — M170 Bilateral primary osteoarthritis of knee: Secondary | ICD-10-CM

## 2016-08-18 DIAGNOSIS — M79642 Pain in left hand: Secondary | ICD-10-CM

## 2016-08-18 DIAGNOSIS — Z79899 Other long term (current) drug therapy: Secondary | ICD-10-CM | POA: Diagnosis not present

## 2016-08-18 DIAGNOSIS — Z9889 Other specified postprocedural states: Secondary | ICD-10-CM

## 2016-08-18 DIAGNOSIS — Z87891 Personal history of nicotine dependence: Secondary | ICD-10-CM | POA: Diagnosis not present

## 2016-08-18 DIAGNOSIS — M75111 Incomplete rotator cuff tear or rupture of right shoulder, not specified as traumatic: Secondary | ICD-10-CM | POA: Insufficient documentation

## 2016-08-18 DIAGNOSIS — M19071 Primary osteoarthritis, right ankle and foot: Secondary | ICD-10-CM

## 2016-08-18 DIAGNOSIS — M79641 Pain in right hand: Secondary | ICD-10-CM

## 2016-08-18 MED ORDER — HYDROXYCHLOROQUINE SULFATE 200 MG PO TABS: 200.0000 mg | ORAL_TABLET | Freq: Two times a day (BID) | ORAL | 2 refills | Status: DC

## 2016-08-18 MED ORDER — PREDNISONE 5 MG PO TABS: 5.0000 mg | ORAL_TABLET | Freq: Every day | ORAL | 1 refills | Status: DC

## 2016-08-18 MED ORDER — PREDNISONE 1 MG PO TABS: ORAL_TABLET | ORAL | 0 refills | Status: DC

## 2016-08-18 NOTE — Patient Instructions (Addendum)
Standing Labs We placed an order today for your standing lab work.    Please come back and get your standing labs in 1 month, then in 3 months.  We have open lab Monday through Friday from 8:30-11:30 AM and 1:30-4 PM at the office of Dr. Arbutus Ped, PA.   The office is located at [address], [place], [place] [address] No appointment is necessary.   Labs are drawn by [employer].  You may receive a bill from [facility] for your lab work.    Start taking hydroxychloroquine 200 mg by mouth twice a day Monday through Friday  Continue prednisone 10 mg daily for one month, then reduce dose to prednisone 9 mg (one 5 mg tablet and four 1 mg tablets).  We will continue to reduce dose by 1 mg every month if possible.     Hydroxychloroquine tablets What is this medicine? HYDROXYCHLOROQUINE (hye drox ee KLOR oh kwin) is used to treat rheumatoid arthritis and systemic lupus erythematosus. It is also used to treat malaria. This medicine may be used for other purposes; ask your health care provider or pharmacist if you have questions. COMMON BRAND NAME(S): Plaquenil, Quineprox What should I tell my health care provider before I take this medicine? They need to know if you have any of these conditions: -diabetes -eye disease, vision problems -G6PD deficiency -history of blood diseases -history of irregular heartbeat -if you often drink alcohol -kidney disease -liver disease -porphyria -psoriasis -seizures -an unusual or allergic reaction to chloroquine, hydroxychloroquine, other medicines, foods, dyes, or preservatives -pregnant or trying to get pregnant -breast-feeding How should I use this medicine? Take this medicine by mouth with a glass of water. Follow the directions on the prescription label. Avoid taking antacids within 4 hours of taking this medicine. It is best to separate these medicines by at least 4 hours. Do not cut, crush or chew this medicine. You can  take it with or without food. If it upsets your stomach, take it with food. Take your medicine at regular intervals. Do not take your medicine more often than directed. Take all of your medicine as directed even if you think you are better. Do not skip doses or stop your medicine early. Talk to your pediatrician regarding the use of this medicine in children. While this drug may be prescribed for selected conditions, precautions do apply. Overdosage: If you think you have taken too much of this medicine contact a poison control center or emergency room at once. NOTE: This medicine is only for you. Do not share this medicine with others. What if I miss a dose? If you miss a dose, take it as soon as you can. If it is almost time for your next dose, take only that dose. Do not take double or extra doses. What may interact with this medicine? Do not take this medicine with any of the following medications: -cisapride -dofetilide -dronedarone -live virus vaccines -penicillamine -pimozide -thioridazine -ziprasidone This medicine may also interact with the following medications: -ampicillin -antacids -cimetidine -cyclosporine -digoxin -medicines for diabetes, like insulin, glipizide, glyburide -medicines for seizures like carbamazepine, phenobarbital, phenytoin -mefloquine -methotrexate -other medicines that prolong the QT interval (cause an abnormal heart rhythm) -praziquantel This list may not describe all possible interactions. Give your health care provider a list of all the medicines, herbs, non-prescription drugs, or dietary supplements you use. Also tell them if you smoke, drink alcohol, or use illegal drugs. Some items may interact with your medicine. What  should I watch for while using this medicine? Tell your doctor or healthcare professional if your symptoms do not start to get better or if they get worse. Avoid taking antacids within 4 hours of taking this medicine. It is best to  separate these medicines by at least 4 hours. Tell your doctor or health care professional right away if you have any change in your eyesight. Your vision and blood may be tested before and during use of this medicine. This medicine can make you more sensitive to the sun. Keep out of the sun. If you cannot avoid being in the sun, wear protective clothing and use sunscreen. Do not use sun lamps or tanning beds/booths. What side effects may I notice from receiving this medicine? Side effects that you should report to your doctor or health care professional as soon as possible: -allergic reactions like skin rash, itching or hives, swelling of the face, lips, or tongue -changes in vision -decreased hearing or ringing of the ears -redness, blistering, peeling or loosening of the skin, including inside the mouth -seizures -sensitivity to light -signs and symptoms of a dangerous change in heartbeat or heart rhythm like chest pain; dizziness; fast or irregular heartbeat; palpitations; feeling faint or lightheaded, falls; breathing problems -signs and symptoms of liver injury like dark yellow or brown urine; general ill feeling or flu-like symptoms; light-colored stools; loss of appetite; nausea; right upper belly pain; unusually weak or tired; yellowing of the eyes or skin -signs and symptoms of low blood sugar such as feeling anxious; confusion; dizziness; increased hunger; unusually weak or tired; sweating; shakiness; cold; irritable; headache; blurred vision; fast heartbeat; loss of consciousness -uncontrollable head, mouth, neck, arm, or leg movements Side effects that usually do not require medical attention (report to your doctor or health care professional if they continue or are bothersome): -anxious -diarrhea -dizziness -hair loss -headache -irritable -loss of appetite -nausea, vomiting -stomach pain This list may not describe all possible side effects. Call your doctor for medical advice  about side effects. You may report side effects to FDA at 1-800-FDA-1088. Where should I keep my medicine? Keep out of the reach of children. In children, this medicine can cause overdose with small doses. Store at room temperature between 15 and 30 degrees C (59 and 86 degrees F). Protect from moisture and light. Throw away any unused medicine after the expiration date. NOTE: This sheet is a summary. It may not cover all possible information. If you have questions about this medicine, talk to your doctor, pharmacist, or health care provider.  2018 Elsevier/Gold Standard (2015-11-27 14:16:15)

## 2016-08-18 NOTE — Progress Notes (Signed)
Pharmacy Note  Subjective: Patient presents today to the [facility] to see Dr. Corliss Skains.  Patient seen by the pharmacist for counseling on hydroxychloroquine.    Objective: 05/19/2016 CBC normal, CMP normal  Assessment/Plan: Patient was prescribed hydroxychloroquine 200 mg BID Monday through Friday.  Patient was counseled on the purpose, proper use, and adverse effects of hydroxychloroquine including nausea/diarrhea, skin rash, headaches, and sun sensitivity.  Discussed importance of annual eye exams while on hydroxychloroquine to monitor to ocular toxicity and discussed importance of frequent laboratory monitoring.  Provided patient with eye exam form for baseline ophthalmologic exam and standing lab instructions.  Provided patient with educational materials on hydroxychloroquine and answered all questions.  Patient consented to hydroxychloroquine.  Will upload consent in the media tab.    Patient is currently taking prednisone 10 mg daily.  Prednisone taper discussed with Dr. Corliss Skains.  Patient was advised to continue prednisone 10 mg daily (confirms she has 10 mg dose prescribed by PCP) for one month then we will taper prednisone by 1 mg every month if able.  Counseled patient on purpose, proper use, and adverse effects of prednisone.  Will send in prescription for prednisone 5 mg and 1 mg tablets to use for prednisone taper.  Note patient continues to take ibuprofen as needed.  I again counseled patient that use of ibuprofen with prednisone may increase the risk of GI adverse effects.  I advised patient to avoid use of ibuprofen with prednisone.  Patient voiced understanding.    Lilla Shook, Pharm.D., BCPS Clinical Pharmacist Pager: [phone] Phone: [phone] 08/18/2016 11:20 AM

## 2016-09-28 ENCOUNTER — Telehealth (INDEPENDENT_AMBULATORY_CARE_PROVIDER_SITE_OTHER): Payer: Self-pay | Admitting: Orthopaedic Surgery

## 2016-09-28 NOTE — Telephone Encounter (Signed)
Returned call to patient left message to call back [phone]

## 2016-09-29 ENCOUNTER — Ambulatory Visit (INDEPENDENT_AMBULATORY_CARE_PROVIDER_SITE_OTHER): Payer: Medicare Other | Admitting: Orthopaedic Surgery

## 2016-11-13 DIAGNOSIS — Z8669 Personal history of other diseases of the nervous system and sense organs: Secondary | ICD-10-CM | POA: Insufficient documentation

## 2016-11-13 DIAGNOSIS — Z87891 Personal history of nicotine dependence: Secondary | ICD-10-CM | POA: Insufficient documentation

## 2016-11-13 NOTE — Progress Notes (Deleted)
Office Visit Note  Patient: [name] Good             Date of Birth: 1950-11-01           MRN: [id number]             PCP: Kaleen Mask, MD Referring: Kaleen Mask, * Visit Date: 11/18/2016 Occupation: @GUAROCC @    Subjective:  No chief complaint on file.   History of Present Illness: [name] Good is a 66 y.o. female ***   Activities of Daily Living:  Patient reports morning stiffness for *** {minute/hour:19697}.   Patient {ACTIONS;DENIES/REPORTS:21021675::"Denies"} nocturnal pain.  Difficulty dressing/grooming: {ACTIONS;DENIES/REPORTS:21021675::"Denies"} Difficulty climbing stairs: {ACTIONS;DENIES/REPORTS:21021675::"Denies"} Difficulty getting out of chair: {ACTIONS;DENIES/REPORTS:21021675::"Denies"} Difficulty using hands for taps, buttons, cutlery, and/or writing: {ACTIONS;DENIES/REPORTS:21021675::"Denies"}   No Rheumatology ROS completed.   PMFS History:  Patient Active Problem List   Diagnosis Date Noted  . Former smoker 11/13/2016  . History of restless legs syndrome 11/13/2016  . Nontraumatic incomplete tear of right rotator cuff 08/18/2016  . Autoimmune disease (HCC) 08/12/2016  . Primary osteoarthritis of both knees 08/12/2016  . Primary osteoarthritis of both feet 08/12/2016  . Obesity (BMI 35.0-39.9 without comorbidity) 08/12/2016  . Osteoarthritis of right hip 10/25/2015  . 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 S/P lumbar laminectomy 06/14/2015    Past Medical History:  Diagnosis Date  . Arthritis   . Depression   . GERD (gastroesophageal reflux disease)   . Myocardial infarction (HCC) 91   no visits to cardiac dr(thomas kelly) since 92  . Wound dehiscence    lumbar    No family history on file. Past Surgical History:  Procedure Laterality Date  . APPENDECTOMY     17 yrs  . BREAST SURGERY Left    cyst  . CARDIAC CATHETERIZATION  91  . CHOLECYSTECTOMY     66 yrs old  . LUMBAR LAMINECTOMY/DECOMPRESSION  MICRODISCECTOMY N/A 06/14/2015   Procedure: Lumbar Three-Four,Lumbar Four-Five, Lumbar Five-Sacral One Laminectomy;  Surgeon: Tia Alert, MD;  Location: [facility];  Service: Neurosurgery;  Laterality: N/A;  . LUMBAR WOUND DEBRIDEMENT N/A 07/24/2015   Procedure: lumbar wound revision;  Surgeon: Tia Alert, MD;  Location: [facility];  Service: Neurosurgery;  Laterality: N/A;  . TOTAL HIP ARTHROPLASTY Right 10/25/2015   Procedure: RIGHT TOTAL HIP ARTHROPLASTY ANTERIOR APPROACH;  Surgeon: Kathryne Hitch, MD;  Location: [facility];  Service: Orthopedics;  Laterality: Right;  . TUBAL LIGATION     Social History   Social History Narrative  . No narrative on file     Objective: Vital Signs: There were no vitals taken for this visit.   Physical Exam   Musculoskeletal Exam: ***  CDAI Exam: No CDAI exam completed.    Investigation: No additional findings.     Imaging: No results found.  Speciality Comments: No specialty comments available.    Procedures:  No procedures performed Allergies: Amoxicillin   Assessment / Plan:     Visit Diagnoses: Autoimmune disease (HCC)  - Positive ANA, positive anti-DNA, history of arthritis, chronic prednisone since 2016.  High risk medication use - Plaquenil  200 mg po BID Mon-Fri(07/2016)., long term prednisone use  since 2016  Nontraumatic incomplete tear of right rotator cuff  Status post total replacement of right hip  Primary osteoarthritis of both knees  Primary osteoarthritis of both feet  DDD (degenerative disc disease), lumbar s/p lumbar laminectomy   Obesity (BMI 35.0-39.9 without comorbidity)  History  of restless legs syndrome  Former smoker    Orders: No orders of the defined types were placed in this encounter.  No orders of the defined types were placed in this encounter.   Face-to-face time spent with patient was *** minutes. 50% of time was spent in counseling and coordination of  care.  Follow-Up Instructions: No Follow-up on file.   Pollyann SavoyShaili Jeanelle Dake, MD  Note - This record has been created using [employer]agon software.  Chart creation errors have been sought, but may not always  have been located. Such creation errors do not reflect on  the standard of medical care.

## 2016-11-18 ENCOUNTER — Ambulatory Visit: Payer: Medicare Other | Admitting: Rheumatology

## 2017-06-16 ENCOUNTER — Ambulatory Visit (INDEPENDENT_AMBULATORY_CARE_PROVIDER_SITE_OTHER): Payer: Medicare Other | Admitting: Orthopaedic Surgery

## 2017-06-16 ENCOUNTER — Encounter (INDEPENDENT_AMBULATORY_CARE_PROVIDER_SITE_OTHER): Payer: Self-pay

## 2017-06-29 ENCOUNTER — Encounter (INDEPENDENT_AMBULATORY_CARE_PROVIDER_SITE_OTHER): Payer: Self-pay | Admitting: Orthopaedic Surgery

## 2017-06-29 ENCOUNTER — Ambulatory Visit (INDEPENDENT_AMBULATORY_CARE_PROVIDER_SITE_OTHER): Payer: Medicare Other | Admitting: Orthopaedic Surgery

## 2017-06-29 DIAGNOSIS — M25562 Pain in left knee: Secondary | ICD-10-CM

## 2017-06-29 DIAGNOSIS — M1712 Unilateral primary osteoarthritis, left knee: Secondary | ICD-10-CM

## 2017-06-29 DIAGNOSIS — M25561 Pain in right knee: Secondary | ICD-10-CM | POA: Diagnosis not present

## 2017-06-29 DIAGNOSIS — M170 Bilateral primary osteoarthritis of knee: Secondary | ICD-10-CM

## 2017-06-29 DIAGNOSIS — G8929 Other chronic pain: Secondary | ICD-10-CM

## 2017-06-29 DIAGNOSIS — M1711 Unilateral primary osteoarthritis, right knee: Secondary | ICD-10-CM | POA: Diagnosis not present

## 2017-06-29 NOTE — Progress Notes (Signed)
Office Visit Note   Patient: [name] Good           Date of Birth: [birth year]           MRN: [id number] Visit Date: 06/29/2017              Requested by: Kaleen MaskElkins, Wilson Oliver, MD [address] [place], [place] [id number] PCP: Kaleen MaskElkins, Wilson Oliver, MD   Assessment & Plan: Visit Diagnoses:  1. Primary osteoarthritis of both knees   2. Chronic pain of right knee   3. Chronic pain of left knee     Plan: I agree with her trying a regimen of steroid injections in both her knees followed by hyaluronic acid in both her knees months from now since this is helped over the years and kept her out of any type of surgery.  She did ask about stem cell research and stem cell therapy for her knees and I told her it will have any experience with this she had and that she may seek opinions about that as well.  I will see her back in 4 weeks to have the place hyaluronic acid in both her knees.  Follow-Up Instructions: Return in about 4 weeks (around 07/27/2017).   Orders:  Orders Placed This Encounter  Procedures  . Large Joint Inj   No orders of the defined types were placed in this encounter.     Procedures: Large Joint Inj: bilateral knee on 06/29/2017 10:54 AM Indications: diagnostic evaluation and pain Details: 22 G 1.5 in needle, superolateral approach  Arthrogram: No  Outcome: tolerated well, no immediate complications Procedure, treatment alternatives, risks and benefits explained, specific risks discussed. Consent was given by the patient. Immediately prior to procedure a time out was called to verify the correct patient, procedure, equipment, support staff and site/side marked as required. Patient was prepped and draped in the usual sterile fashion.       Clinical Data: No additional findings.   Subjective: Chief Complaint  Patient presents with  . Left Knee - Pain  . Right Knee - Pain  The patient has known bilateral knee osteoarthritis.  She embolus with a cane and  has daily pain.  She comes in today hoping to have steroid injections in both knees and then having hyaluronic acid ordered for her knees.  She has had this regimen in the past and has been helpful for her.  Her pain is still daily and is detrimental effect directives delivering her qualify for mobility she still not ready for any type of surgery.  She has had no other acute change in her medical status.  She is not diabetic.  HPI  Review of Systems She currently denies any headache, chest pain, shortness of breath, fever, chills, nausea, vomiting.  Objective: Vital Signs: There were no vitals taken for this visit.  Physical Exam She is alert and oriented x3 and in no acute distress Ortho Exam Examination of both knees show no effusion.  She has slight varus malalignment of both knees.  Her range of motion is full but she has significant medial joint line tenderness and patellofemoral crepitation. Specialty Comments:  No specialty comments available.  Imaging: No results found.   PMFS History: Patient Active Problem List   Diagnosis Date Noted  . Former smoker 11/13/2016  . History of restless legs syndrome 11/13/2016  . Nontraumatic incomplete tear of right rotator cuff 08/18/2016  . Autoimmune disease (HCC) 08/12/2016  . Primary osteoarthritis of both knees  08/12/2016  . Primary osteoarthritis of both feet 08/12/2016  . Obesity (BMI 35.0-39.9 without comorbidity) 08/12/2016  . Osteoarthritis of right hip 10/25/2015  . 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 S/P lumbar laminectomy 06/14/2015   Past Medical History:  Diagnosis Date  . Arthritis   . Depression   . GERD (gastroesophageal reflux disease)   . Myocardial infarction (HCC) 91   no visits to cardiac dr(thomas kelly) since 92  . Wound dehiscence    lumbar    History reviewed. No pertinent family history.  Past Surgical History:  Procedure Laterality Date  . APPENDECTOMY     17 yrs  . BREAST  SURGERY Left    cyst  . CARDIAC CATHETERIZATION  91  . CHOLECYSTECTOMY     67 yrs old  . LUMBAR LAMINECTOMY/DECOMPRESSION MICRODISCECTOMY N/A 06/14/2015   Procedure: Lumbar Three-Four,Lumbar Four-Five, Lumbar Five-Sacral One Laminectomy;  Surgeon: Tia Alert, MD;  Location: [facility];  Service: Neurosurgery;  Laterality: N/A;  . LUMBAR WOUND DEBRIDEMENT N/A 07/24/2015   Procedure: lumbar wound revision;  Surgeon: Tia Alert, MD;  Location: [facility];  Service: Neurosurgery;  Laterality: N/A;  . TOTAL HIP ARTHROPLASTY Right 10/25/2015   Procedure: RIGHT TOTAL HIP ARTHROPLASTY ANTERIOR APPROACH;  Surgeon: Kathryne Hitch, MD;  Location: [facility];  Service: Orthopedics;  Laterality: Right;  . TUBAL LIGATION     Social History   Occupational History  . Not on file  Tobacco Use  . Smoking status: Former Smoker    Packs/day: 2.00    Years: 40.00    Pack years: 80.00    Types: Cigarettes    Last attempt to quit: 04/10/2013    Years since quitting: 4.2  . Smokeless tobacco: Never Used  Substance and Sexual Activity  . Alcohol use: Yes    Comment: rarely  . Drug use: No  . Sexual activity: Not on file

## 2017-07-05 ENCOUNTER — Telehealth (INDEPENDENT_AMBULATORY_CARE_PROVIDER_SITE_OTHER): Payer: Self-pay

## 2017-07-05 NOTE — Telephone Encounter (Signed)
Submitted application for Monovisc online for bilateral knee injection.

## 2017-07-23 ENCOUNTER — Telehealth (INDEPENDENT_AMBULATORY_CARE_PROVIDER_SITE_OTHER): Payer: Self-pay

## 2017-07-23 NOTE — Telephone Encounter (Signed)
Initiated PA for Monovisc, bilateral knee. Pending Authorization# [id number]

## 2017-08-03 ENCOUNTER — Ambulatory Visit (INDEPENDENT_AMBULATORY_CARE_PROVIDER_SITE_OTHER): Payer: Medicare Other | Admitting: Orthopaedic Surgery

## 2017-08-03 ENCOUNTER — Encounter (INDEPENDENT_AMBULATORY_CARE_PROVIDER_SITE_OTHER): Payer: Self-pay | Admitting: Orthopaedic Surgery

## 2017-08-03 ENCOUNTER — Telehealth (INDEPENDENT_AMBULATORY_CARE_PROVIDER_SITE_OTHER): Payer: Self-pay

## 2017-08-03 DIAGNOSIS — M1712 Unilateral primary osteoarthritis, left knee: Secondary | ICD-10-CM | POA: Diagnosis not present

## 2017-08-03 DIAGNOSIS — M1711 Unilateral primary osteoarthritis, right knee: Secondary | ICD-10-CM | POA: Diagnosis not present

## 2017-08-03 NOTE — Telephone Encounter (Signed)
[facility] to have Monovisc Injection, bilateral knee.    Approval #[id number]

## 2017-08-03 NOTE — Progress Notes (Signed)
   Procedure Note  Patient: [name] Good             Date of Birth: 10/05/1950           MRN: [id number]             Visit Date: 08/03/2017  Procedures: Visit Diagnoses: Unilateral primary osteoarthritis, left knee  Unilateral primary osteoarthritis, right knee  Large Joint Inj: bilateral knee on 08/03/2017 11:05 AM Indications: pain and diagnostic evaluation Details: 22 G 1.5 in needle, superolateral approach  Arthrogram: No  Outcome: tolerated well, no immediate complications Procedure, treatment alternatives, risks and benefits explained, specific risks discussed. Consent was given by the patient. Immediately prior to procedure a time out was called to verify the correct patient, procedure, equipment, support staff and site/side marked as required. Patient was prepped and draped in the usual sterile fashion.    The patient has known bilateral knee osteoarthritis and degenerative joint disease.  She is here today for scheduled hyaluronic acid injections with Monovisc in each knee.  She understands the reasoning behind trying these injections as well as the risk and benefits involved.  She does have daily knee pain due to her moderate osteoarthritis.  She is also tried steroid injections and activity modification as well as weight loss.  On exam both knees have medial lateral joint line tenderness with patellofemoral crepitation.  Both knees are ligamentously stable with good range of motion.  She tolerated the Monovisc injections well in both knees.  She will follow-up as needed knowing she can always have steroids in her knees down the road.  I still encouraged her to lose weight and work on quad strengthening exercises.

## 2017-10-08 ENCOUNTER — Telehealth (INDEPENDENT_AMBULATORY_CARE_PROVIDER_SITE_OTHER): Payer: Self-pay | Admitting: Orthopaedic Surgery

## 2017-10-08 NOTE — Telephone Encounter (Signed)
FYI Patient is scheduled for Bil knee injection 11/09/17 with Dr Magnus IvanBlackman at 10:15am.

## 2017-10-16 ENCOUNTER — Emergency Department (HOSPITAL_COMMUNITY): Payer: Medicare Other

## 2017-10-16 ENCOUNTER — Other Ambulatory Visit: Payer: Self-pay

## 2017-10-16 ENCOUNTER — Inpatient Hospital Stay (HOSPITAL_COMMUNITY)
Admission: EM | Admit: 2017-10-16 | Discharge: 2017-10-19 | DRG: 871 | Disposition: A | Discharge status: Home / Self Care | Payer: Medicare Other | Attending: Internal Medicine | Admitting: Internal Medicine

## 2017-10-16 ENCOUNTER — Inpatient Hospital Stay (HOSPITAL_COMMUNITY): Payer: Medicare Other

## 2017-10-16 ENCOUNTER — Encounter (HOSPITAL_COMMUNITY): Payer: Self-pay | Admitting: Emergency Medicine

## 2017-10-16 DIAGNOSIS — L03114 Cellulitis of left upper limb: Secondary | ICD-10-CM

## 2017-10-16 DIAGNOSIS — Z96641 Presence of right artificial hip joint: Secondary | ICD-10-CM | POA: Diagnosis present

## 2017-10-16 DIAGNOSIS — N179 Acute kidney failure, unspecified: Secondary | ICD-10-CM | POA: Diagnosis present

## 2017-10-16 DIAGNOSIS — E669 Obesity, unspecified: Secondary | ICD-10-CM | POA: Diagnosis present

## 2017-10-16 DIAGNOSIS — Z7982 Long term (current) use of aspirin: Secondary | ICD-10-CM | POA: Diagnosis not present

## 2017-10-16 DIAGNOSIS — K219 Gastro-esophageal reflux disease without esophagitis: Secondary | ICD-10-CM | POA: Diagnosis present

## 2017-10-16 DIAGNOSIS — Z9851 Tubal ligation status: Secondary | ICD-10-CM | POA: Diagnosis not present

## 2017-10-16 DIAGNOSIS — E162 Hypoglycemia, unspecified: Secondary | ICD-10-CM

## 2017-10-16 DIAGNOSIS — I252 Old myocardial infarction: Secondary | ICD-10-CM

## 2017-10-16 DIAGNOSIS — G2581 Restless legs syndrome: Secondary | ICD-10-CM | POA: Diagnosis present

## 2017-10-16 DIAGNOSIS — R6521 Severe sepsis with septic shock: Secondary | ICD-10-CM | POA: Diagnosis present

## 2017-10-16 DIAGNOSIS — Z9049 Acquired absence of other specified parts of digestive tract: Secondary | ICD-10-CM

## 2017-10-16 DIAGNOSIS — B999 Unspecified infectious disease: Secondary | ICD-10-CM

## 2017-10-16 DIAGNOSIS — E876 Hypokalemia: Secondary | ICD-10-CM | POA: Diagnosis present

## 2017-10-16 DIAGNOSIS — T68XXXA Hypothermia, initial encounter: Secondary | ICD-10-CM

## 2017-10-16 DIAGNOSIS — Z6841 Body Mass Index (BMI) 40.0 and over, adult: Secondary | ICD-10-CM

## 2017-10-16 DIAGNOSIS — M170 Bilateral primary osteoarthritis of knee: Secondary | ICD-10-CM | POA: Diagnosis present

## 2017-10-16 DIAGNOSIS — M19072 Primary osteoarthritis, left ankle and foot: Secondary | ICD-10-CM | POA: Diagnosis present

## 2017-10-16 DIAGNOSIS — I2510 Atherosclerotic heart disease of native coronary artery without angina pectoris: Secondary | ICD-10-CM | POA: Diagnosis present

## 2017-10-16 DIAGNOSIS — A419 Sepsis, unspecified organism: Secondary | ICD-10-CM

## 2017-10-16 DIAGNOSIS — G92 Toxic encephalopathy: Secondary | ICD-10-CM | POA: Diagnosis present

## 2017-10-16 DIAGNOSIS — Z87891 Personal history of nicotine dependence: Secondary | ICD-10-CM | POA: Diagnosis not present

## 2017-10-16 DIAGNOSIS — Z7952 Long term (current) use of systemic steroids: Secondary | ICD-10-CM

## 2017-10-16 DIAGNOSIS — R531 Weakness: Secondary | ICD-10-CM | POA: Diagnosis present

## 2017-10-16 DIAGNOSIS — M1990 Unspecified osteoarthritis, unspecified site: Secondary | ICD-10-CM

## 2017-10-16 DIAGNOSIS — Z79899 Other long term (current) drug therapy: Secondary | ICD-10-CM

## 2017-10-16 DIAGNOSIS — M7989 Other specified soft tissue disorders: Secondary | ICD-10-CM | POA: Diagnosis not present

## 2017-10-16 DIAGNOSIS — F329 Major depressive disorder, single episode, unspecified: Secondary | ICD-10-CM | POA: Diagnosis present

## 2017-10-16 DIAGNOSIS — M19071 Primary osteoarthritis, right ankle and foot: Secondary | ICD-10-CM | POA: Diagnosis present

## 2017-10-16 DIAGNOSIS — Z79891 Long term (current) use of opiate analgesic: Secondary | ICD-10-CM

## 2017-10-16 DIAGNOSIS — Z880 Allergy status to penicillin: Secondary | ICD-10-CM

## 2017-10-16 LAB — URINALYSIS, ROUTINE W REFLEX MICROSCOPIC
Glucose, UA: NEGATIVE mg/dL
Ketones, ur: NEGATIVE mg/dL
Nitrite: NEGATIVE
Protein, ur: >=300 mg/dL — AB
Specific Gravity, Urine: 1.019 (ref 1.005–1.030)
pH: 5 (ref 5.0–8.0)

## 2017-10-16 LAB — COMPREHENSIVE METABOLIC PANEL
ALT: 20 U/L (ref 14–54)
AST: 29 U/L (ref 15–41)
Albumin: 1.8 g/dL — ABNORMAL LOW (ref 3.5–5.0)
Alkaline Phosphatase: 49 U/L (ref 38–126)
Anion gap: 10 (ref 5–15)
BUN: 15 mg/dL (ref 6–20)
CO2: 14 mmol/L — ABNORMAL LOW (ref 22–32)
Calcium: 6 mg/dL — CL (ref 8.9–10.3)
Chloride: 118 mmol/L — ABNORMAL HIGH (ref 101–111)
Creatinine, Ser: 1.42 mg/dL — ABNORMAL HIGH (ref 0.44–1.00)
GFR calc Af Amer: 43 mL/min — ABNORMAL LOW (ref >60)
GFR calc non Af Amer: 37 mL/min — ABNORMAL LOW (ref >60)
Glucose, Bld: 142 mg/dL — ABNORMAL HIGH (ref 65–99)
Potassium: 2.4 mmol/L — CL (ref 3.5–5.1)
Sodium: 142 mmol/L (ref 135–145)
Total Bilirubin: 0.6 mg/dL (ref 0.3–1.2)
Total Protein: 3.6 g/dL — ABNORMAL LOW (ref 6.5–8.1)

## 2017-10-16 LAB — BASIC METABOLIC PANEL
Anion gap: 8 (ref 5–15)
BUN: 16 mg/dL (ref 6–20)
CO2: 20 mmol/L — ABNORMAL LOW (ref 22–32)
Calcium: 7.5 mg/dL — ABNORMAL LOW (ref 8.9–10.3)
Chloride: 114 mmol/L — ABNORMAL HIGH (ref 101–111)
Creatinine, Ser: 1.35 mg/dL — ABNORMAL HIGH (ref 0.44–1.00)
GFR calc Af Amer: 46 mL/min — ABNORMAL LOW (ref >60)
GFR calc non Af Amer: 39 mL/min — ABNORMAL LOW (ref >60)
Glucose, Bld: 125 mg/dL — ABNORMAL HIGH (ref 65–99)
Potassium: 3.5 mmol/L (ref 3.5–5.1)
Sodium: 142 mmol/L (ref 135–145)

## 2017-10-16 LAB — I-STAT CG4 LACTIC ACID, ED
Lactic Acid, Venous: 4 mmol/L (ref 0.5–1.9)
Lactic Acid, Venous: 4.56 mmol/L (ref 0.5–1.9)

## 2017-10-16 LAB — PROTIME-INR
INR: 1.5
Prothrombin Time: 18 seconds — ABNORMAL HIGH (ref 11.4–15.2)

## 2017-10-16 LAB — CBC WITH DIFFERENTIAL/PLATELET
Basophils Absolute: 0 10*3/uL (ref 0.0–0.1)
Basophils Relative: 0 %
Eosinophils Absolute: 0 10*3/uL (ref 0.0–0.7)
Eosinophils Relative: 0 %
HCT: 30.3 % — ABNORMAL LOW (ref 36.0–46.0)
Hemoglobin: 9.9 g/dL — ABNORMAL LOW (ref 12.0–15.0)
Lymphocytes Relative: 5 %
Lymphs Abs: 1.1 10*3/uL (ref 0.7–4.0)
MCH: 31.8 pg (ref 26.0–34.0)
MCHC: 32.7 g/dL (ref 30.0–36.0)
MCV: 97.4 fL (ref 78.0–100.0)
Monocytes Absolute: 0.7 10*3/uL (ref 0.1–1.0)
Monocytes Relative: 3 %
Neutro Abs: 20.5 10*3/uL — ABNORMAL HIGH (ref 1.7–7.7)
Neutrophils Relative %: 92 %
Platelets: 174 10*3/uL (ref 150–400)
RBC: 3.11 MIL/uL — ABNORMAL LOW (ref 3.87–5.11)
RDW: 12.9 % (ref 11.5–15.5)
WBC Morphology: INCREASED
WBC: 22.3 10*3/uL — ABNORMAL HIGH (ref 4.0–10.5)

## 2017-10-16 LAB — GLUCOSE, CAPILLARY
Glucose-Capillary: 109 mg/dL — ABNORMAL HIGH (ref 65–99)
Glucose-Capillary: 117 mg/dL — ABNORMAL HIGH (ref 65–99)
Glucose-Capillary: 106 mg/dL — ABNORMAL HIGH (ref 65–99)
Glucose-Capillary: 89 mg/dL (ref 65–99)

## 2017-10-16 LAB — COOXEMETRY PANEL
Carboxyhemoglobin: 1 % (ref 0.5–1.5)
Methemoglobin: 1.5 % (ref 0.0–1.5)
O2 Saturation: 70.3 %
Total hemoglobin: 12.4 g/dL (ref 12.0–16.0)

## 2017-10-16 LAB — LACTIC ACID, PLASMA
Lactic Acid, Venous: 4.3 mmol/L (ref 0.5–1.9)
Lactic Acid, Venous: 3 mmol/L (ref 0.5–1.9)

## 2017-10-16 LAB — MRSA PCR SCREENING: MRSA by PCR: NEGATIVE

## 2017-10-16 LAB — PROCALCITONIN: Procalcitonin: 8.62 ng/mL

## 2017-10-16 LAB — CBG MONITORING, ED: Glucose-Capillary: 62 mg/dL — ABNORMAL LOW (ref 65–99)

## 2017-10-16 IMAGING — DX DG FOREARM 2V*L*
2 series · 2 of 2 positions shown · non-contrast
Comparison: None.

CLINICAL DATA: Left arm pain and weakness.

EXAM:
LEFT FOREARM - 2 VIEW

[forearm ap]
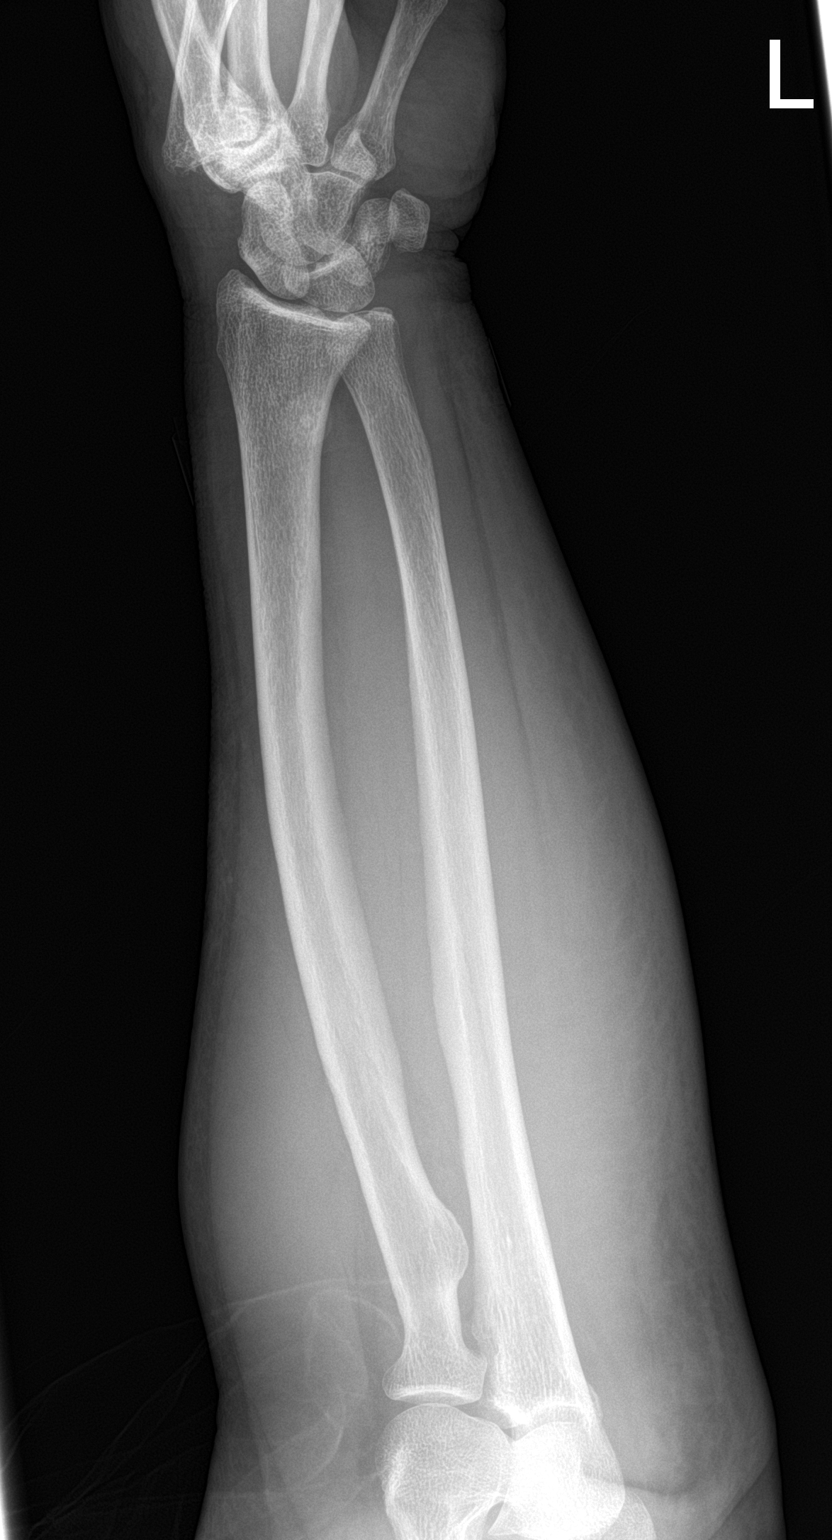

[forearm lat]
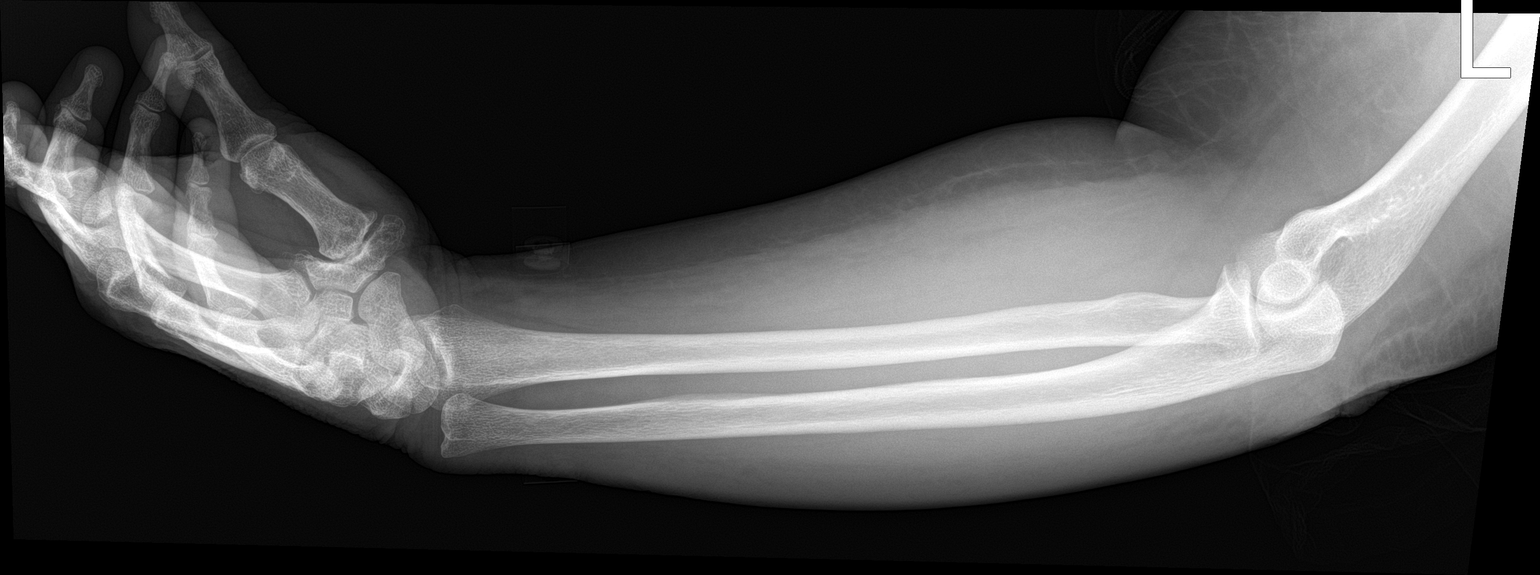

[2 of 2 positions shown; findings below may reference images not displayed]

FINDINGS: No evidence of fracture or dislocation. Moderate degenerate change
of the first carpometacarpal joint.
IMPRESSION: No acute findings.

## 2017-10-16 IMAGING — DX DG CHEST 1V PORT
1 series · 1 of 1 positions shown · non-contrast
Comparison: None.

CLINICAL DATA: Sepsis.  Left arm pain and weakness.

EXAM:
PORTABLE CHEST 1 VIEW

[chest ap]
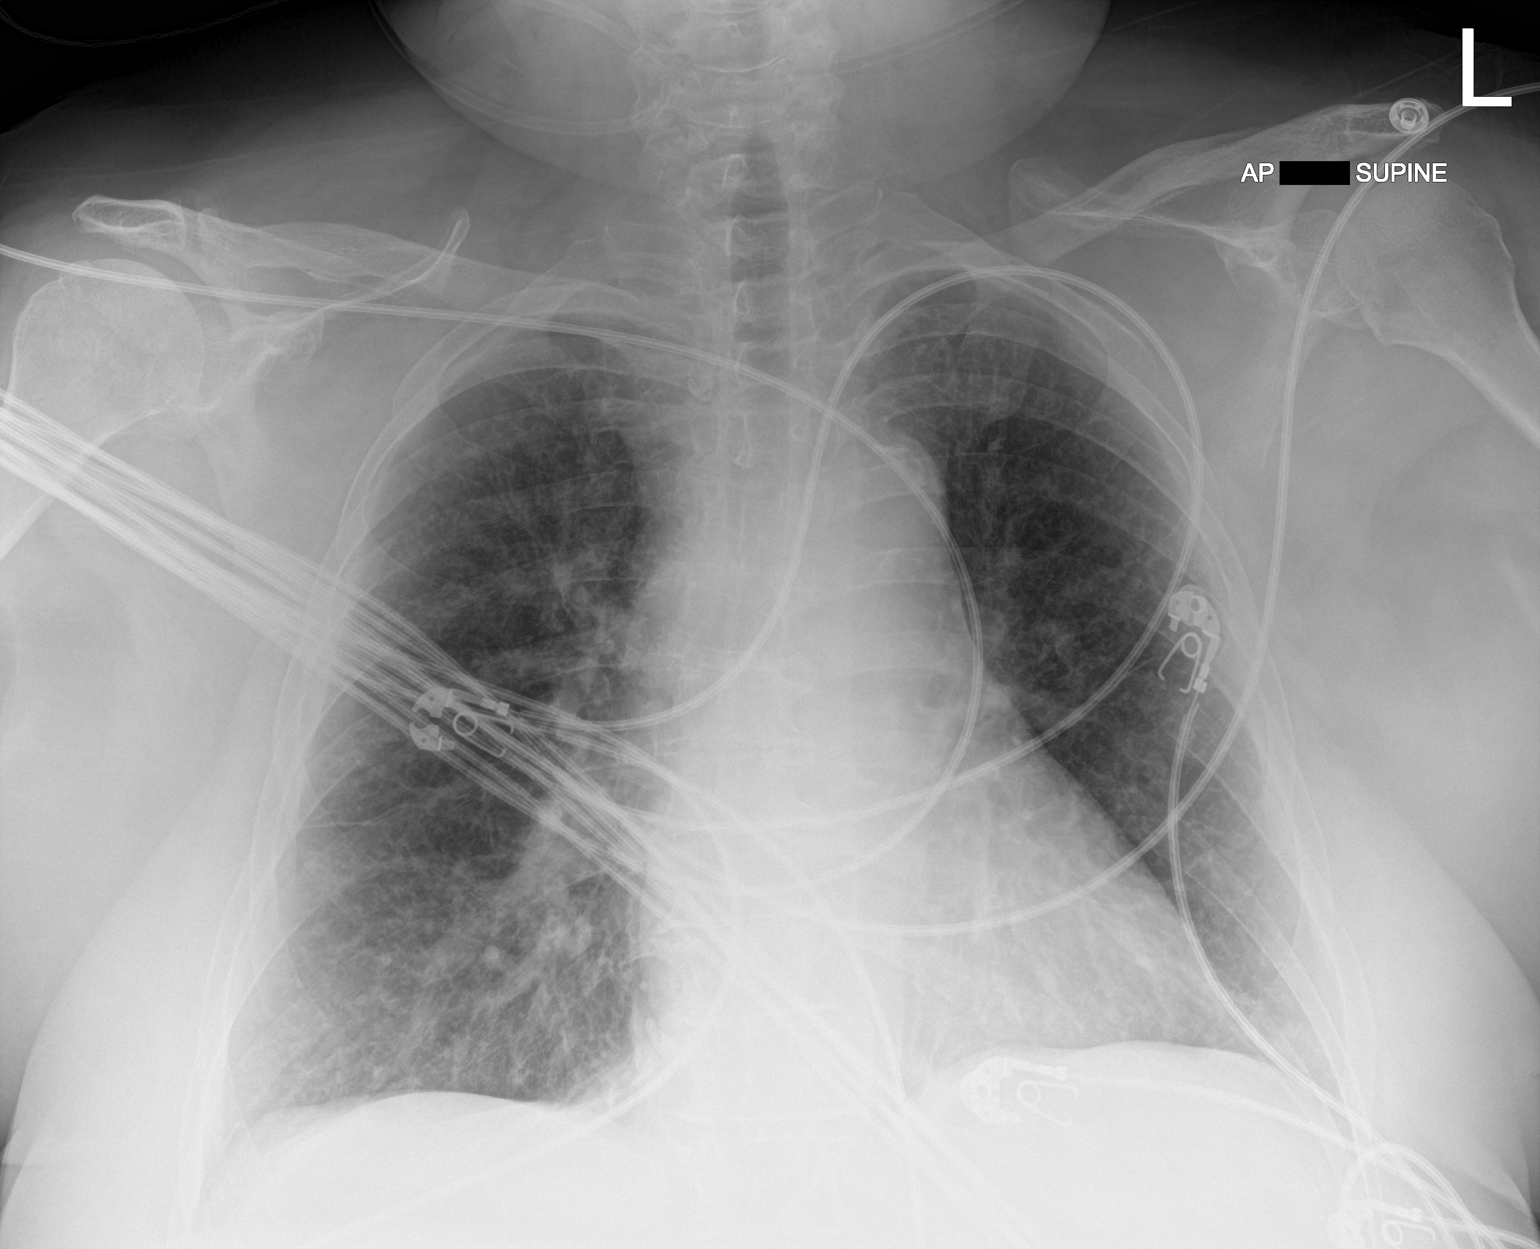

[1 of 1 positions shown; findings below may reference images not displayed]

FINDINGS: Lungs are adequately inflated without focal lobar consolidation or
effusion. There is subtle prominence of the perihilar markings which
may be due to mild degree of vascular congestion. Cardiomediastinal
silhouette is unremarkable. There is calcified plaque over the
thoracic aorta. Mild degenerate change of the spine.
IMPRESSION: Suggestion mild vascular congestion.

## 2017-10-16 IMAGING — DX DG CHEST 1V PORT
1 series · 1 of 1 positions shown · non-contrast
Comparison: [DATE] earlier.

CLINICAL DATA: Central line placement.

EXAM:
PORTABLE CHEST 1 VIEW

[chest ap]
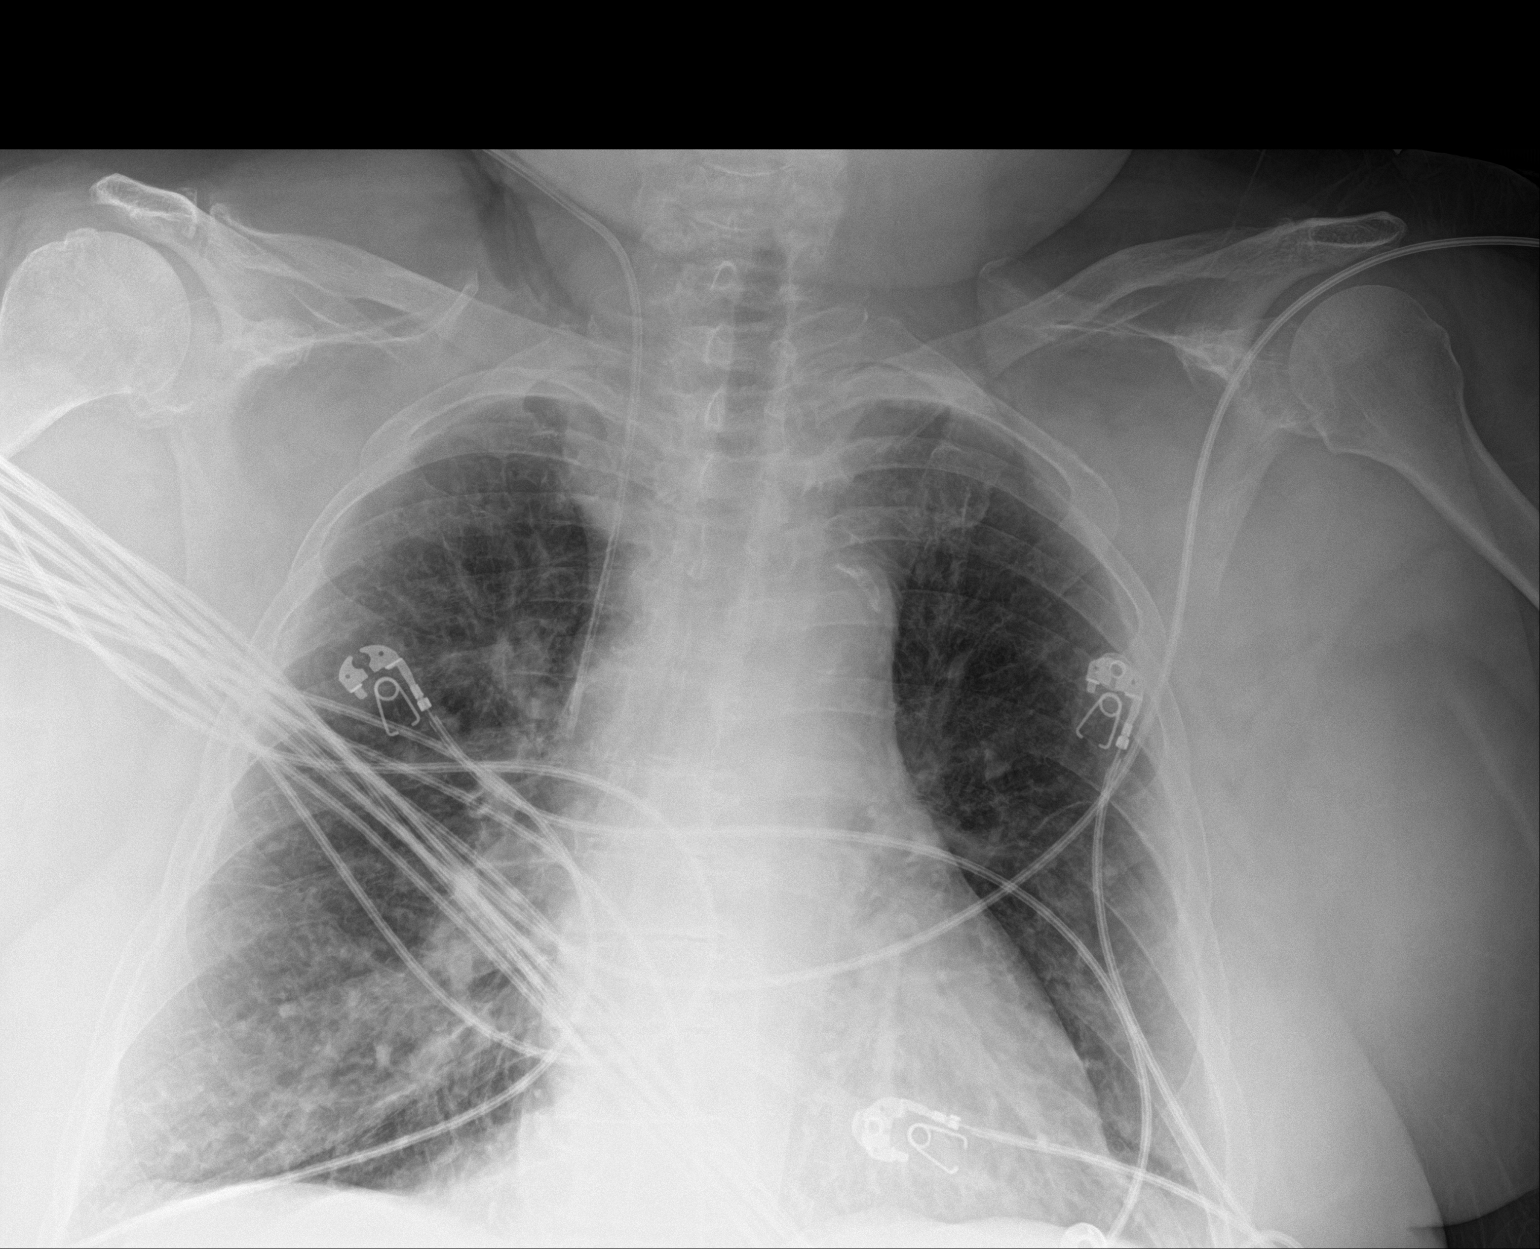

[1 of 1 positions shown; findings below may reference images not displayed]

FINDINGS: Interval placement of right IJ central venous catheter with tip over
the SVC. Small amount of air within the soft tissues at the access
site in the right neck. Lungs are adequately inflated without lobar
consolidation, effusion or pneumothorax. Subtle stable prominence of
the perihilar markings likely mild vascular congestion. Remainder of
the exam is unchanged.
IMPRESSION: Stable minimal vascular congestion.

Right IJ central venous catheter with tip over the SVC. No
pneumothorax.

## 2017-10-16 MED ORDER — VANCOMYCIN HCL IN DEXTROSE 1-5 GM/200ML-% IV SOLN
1000.0000 mg | INTRAVENOUS | Status: DC
Start: 2017-10-17 — End: 2017-10-17
  Administered 2017-10-17: 1000 mg via INTRAVENOUS
  Filled 2017-10-16: qty 200

## 2017-10-16 MED ORDER — VANCOMYCIN HCL 10 G IV SOLR
1500.0000 mg | Freq: Once | INTRAVENOUS | Status: AC
  Administered 2017-10-16: 1500 mg via INTRAVENOUS
  Filled 2017-10-16: qty 1500

## 2017-10-16 MED ORDER — SODIUM CHLORIDE 0.9 % IV BOLUS (SEPSIS)
1000.0000 mL | Freq: Once | INTRAVENOUS | Status: AC
  Administered 2017-10-16: 1000 mL via INTRAVENOUS

## 2017-10-16 MED ORDER — ROPINIROLE HCL 1 MG PO TABS
3.0000 mg | ORAL_TABLET | Freq: Every day | ORAL | Status: DC
  Administered 2017-10-16 – 2017-10-17 (×2): 3 mg via ORAL
  Filled 2017-10-16 (×4): qty 3

## 2017-10-16 MED ORDER — PANTOPRAZOLE SODIUM 40 MG PO TBEC
40.0000 mg | DELAYED_RELEASE_TABLET | Freq: Every day | ORAL | Status: DC
  Administered 2017-10-16 – 2017-10-19 (×4): 40 mg via ORAL
  Filled 2017-10-16 (×4): qty 1

## 2017-10-16 MED ORDER — NOREPINEPHRINE 4 MG/250ML-% IV SOLN
5.0000 ug/min | INTRAVENOUS | Status: DC
  Administered 2017-10-16: 10 ug/min via INTRAVENOUS
  Filled 2017-10-16: qty 250

## 2017-10-16 MED ORDER — SODIUM CHLORIDE 0.9 % IV SOLN: 500.0000 mL | INTRAVENOUS | Status: DC | PRN

## 2017-10-16 MED ORDER — LACTATED RINGERS IV BOLUS
2000.0000 mL | Freq: Once | INTRAVENOUS | Status: AC
  Administered 2017-10-16: 2000 mL via INTRAVENOUS

## 2017-10-16 MED ORDER — DEXTROSE 50 % IV SOLN
1.0000 | Freq: Once | INTRAVENOUS | Status: AC
  Administered 2017-10-16: 50 mL via INTRAVENOUS

## 2017-10-16 MED ORDER — PIPERACILLIN-TAZOBACTAM 3.375 G IVPB
3.3750 g | Freq: Three times a day (TID) | INTRAVENOUS | Status: DC
  Filled 2017-10-16: qty 50

## 2017-10-16 MED ORDER — HEPARIN SODIUM (PORCINE) 5000 UNIT/ML IJ SOLN
5000.0000 [IU] | Freq: Three times a day (TID) | INTRAMUSCULAR | Status: DC
  Administered 2017-10-16 – 2017-10-19 (×10): 5000 [IU] via SUBCUTANEOUS
  Filled 2017-10-16 (×9): qty 1

## 2017-10-16 MED ORDER — ACETAMINOPHEN 325 MG PO TABS
650.0000 mg | ORAL_TABLET | ORAL | Status: DC | PRN
  Administered 2017-10-18 – 2017-10-19 (×2): 650 mg via ORAL
  Filled 2017-10-16 (×2): qty 2

## 2017-10-16 MED ORDER — NOREPINEPHRINE 4 MG/250ML-% IV SOLN
0.0000 ug/min | Freq: Once | INTRAVENOUS | Status: AC
  Administered 2017-10-16: 10 ug/min via INTRAVENOUS
  Filled 2017-10-16: qty 250

## 2017-10-16 MED ORDER — ALBUTEROL SULFATE (2.5 MG/3ML) 0.083% IN NEBU: 2.5000 mg | INHALATION_SOLUTION | RESPIRATORY_TRACT | Status: DC | PRN

## 2017-10-16 MED ORDER — SODIUM CHLORIDE 0.9 % IV SOLN
1.0000 g | INTRAVENOUS | Status: DC
  Administered 2017-10-16 – 2017-10-19 (×4): 1 g via INTRAVENOUS
  Filled 2017-10-16 (×4): qty 10

## 2017-10-16 MED ORDER — CEFTRIAXONE SODIUM 1 G IJ SOLR: 1.0000 g | INTRAMUSCULAR | Status: DC

## 2017-10-16 MED ORDER — DEXTROSE 50 % IV SOLN
INTRAVENOUS | Status: AC
  Administered 2017-10-16: 50 mL via INTRAVENOUS
  Filled 2017-10-16: qty 50

## 2017-10-16 MED ORDER — SODIUM CHLORIDE 0.9 % IV SOLN
INTRAVENOUS | Status: DC
  Administered 2017-10-16 – 2017-10-18 (×4): via INTRAVENOUS

## 2017-10-16 MED ORDER — HYDROCORTISONE NA SUCCINATE PF 100 MG IJ SOLR
100.0000 mg | Freq: Once | INTRAMUSCULAR | Status: AC
  Administered 2017-10-16: 100 mg via INTRAVENOUS
  Filled 2017-10-16: qty 2

## 2017-10-16 MED ORDER — ORAL CARE MOUTH RINSE
15.0000 mL | Freq: Two times a day (BID) | OROMUCOSAL | Status: DC
  Administered 2017-10-16 – 2017-10-19 (×6): 15 mL via OROMUCOSAL

## 2017-10-16 MED ORDER — INSULIN ASPART 100 UNIT/ML ~~LOC~~ SOLN: 0.0000 [IU] | SUBCUTANEOUS | Status: DC

## 2017-10-16 MED ORDER — PIPERACILLIN-TAZOBACTAM 3.375 G IVPB 30 MIN
3.3750 g | Freq: Once | INTRAVENOUS | Status: AC
  Administered 2017-10-16: 3.375 g via INTRAVENOUS
  Filled 2017-10-16: qty 50

## 2017-10-16 MED ORDER — SODIUM CHLORIDE 0.9 % IV SOLN
250.0000 mL | INTRAVENOUS | Status: DC | PRN
Start: 2017-10-16 — End: 2017-10-20
  Administered 2017-10-17: 250 mL via INTRAVENOUS

## 2017-10-16 MED ORDER — SERTRALINE HCL 100 MG PO TABS
100.0000 mg | ORAL_TABLET | Freq: Every day | ORAL | Status: DC
  Administered 2017-10-16 – 2017-10-19 (×4): 100 mg via ORAL
  Filled 2017-10-16 (×4): qty 1

## 2017-10-16 MED ORDER — ONDANSETRON HCL 4 MG/2ML IJ SOLN: 4.0000 mg | Freq: Four times a day (QID) | INTRAMUSCULAR | Status: DC | PRN

## 2017-10-16 MED ORDER — ASPIRIN EC 325 MG PO TBEC
325.0000 mg | DELAYED_RELEASE_TABLET | Freq: Two times a day (BID) | ORAL | Status: DC
  Administered 2017-10-16 – 2017-10-19 (×8): 325 mg via ORAL
  Filled 2017-10-16 (×9): qty 1

## 2017-10-16 MED ORDER — VANCOMYCIN HCL IN DEXTROSE 1-5 GM/200ML-% IV SOLN
1000.0000 mg | Freq: Once | INTRAVENOUS | Status: DC
  Filled 2017-10-16: qty 200

## 2017-10-16 NOTE — ED Triage Notes (Signed)
Per EMS pt from home. Called out d/t left arm pain and weakness. No radial pulses on arrival.  Pressure 60/42.  Given 1000 NS and Epi 1:100000 via IO in left tib with return of radial pulses at that time. Cold to touch.  Left arm redness and swelling noted. CBG  85

## 2017-10-16 NOTE — ED Notes (Addendum)
Large amount of malodorous stool cleaned from patients rectal and vaginal area, skin breakdown noted under breast, groin and gluteal fold.  L arm red, swollen from mid forearm to mid bicep.

## 2017-10-16 NOTE — Progress Notes (Signed)
Pharmacy Antibiotic Note  [name] Good is a 67 y.o. female admitted on 10/16/2017 with AMS/sepsis.  Pharmacy has been consulted for Vancomycin and Zosyn dosing.  Vancomycin 1500 mg IV given in ED at 0615   Plan: Vancomycin 1 g IV q24h Zosyn 3.375 g IV q8h    Weight: 201 lb (91.2 kg)  Temp (24hrs), Avg:97.3 F (36.3 C), Min:96.4 F (35.8 C), Max:97.5 F (36.4 C)  Recent Labs  Lab 10/16/17 0540 10/16/17 0605 10/16/17 0620  WBC  --  22.3*  --   CREATININE  --  1.42*  --   LATICACIDVEN 4.00*  --  4.56*    CrCl cannot be calculated (Unknown ideal weight.).    Allergies  Allergen Reactions  . Amoxicillin Other (See Comments)    Has patient had a PCN reaction causing immediate rash, facial/tongue/throat swelling, SOB or lightheadedness with hypotension: No Has patient had a PCN reaction causing severe rash involving mucus membranes or skin necrosis: No Has patient had a PCN reaction that required hospitalization No Has patient had a PCN reaction occurring within the last 10 years: No If all of the above answers are "NO", then may proceed with Cephalosporin use.  Unknown reaction Pt states it made her feel very sick   Eddie Candlebbott, Jacqulene Huntley Vernon 10/16/2017 8:27 AM

## 2017-10-16 NOTE — Progress Notes (Signed)
Sepsis - Repeat Assessment  Performed at:    0800  Vitals     Blood pressure (!) 119/58, pulse (!) 109, temperature (!) 97.5 F (36.4 C), resp. rate (!) 22, weight 91.2 kg (201 lb), SpO2 99 %.  Heart:     Regular rate and rhythm  Lungs:    CTA  Capillary Refill:   <2 sec  Peripheral Pulse:   Radial pulse palpable  Skin:     Normal Color

## 2017-10-16 NOTE — ED Notes (Signed)
Pt's husband at bedside, pts husband states he knows MD's were attempting to wean pt off opiates. Pt is taking tramadol, he states he also found receipt for phentermine in patients room.

## 2017-10-16 NOTE — Procedures (Signed)
Central Venous Catheter Insertion Procedure Note [name] Good [id number] [birth year]  Procedure: Insertion of Central Venous Catheter Indications: Assessment of intravascular volume  Procedure Details Consent: Risks of procedure as well as the alternatives and risks of each were explained to the (patient/caregiver).  Consent for procedure obtained. Time Out: Verified patient identification, verified procedure, site/side was marked, verified correct patient position, special equipment/implants available, medications/allergies/relevent history reviewed, required imaging and test results available.  Performed  Maximum sterile technique was used including antiseptics, cap, gloves, gown, hand hygiene, mask and sheet. Skin prep: Chlorhexidine; local anesthetic administered A antimicrobial bonded/coated triple lumen catheter was placed in the right internal jugular vein using the Seldinger technique.  Ultrasound was used to verify the patency of the vein and for real time needle guidance.  Evaluation Blood flow good Complications: No apparent complications Patient did tolerate procedure well. Chest X-ray ordered to verify placement.  CXR: pending.  [name] Good 10/16/2017, 8:14 AM

## 2017-10-16 NOTE — ED Provider Notes (Signed)
[facility] EMERGENCY DEPARTMENT Provider Note   CSN: [id number] Arrival date & time: 10/16/17  0515     History   Chief Complaint Chief Complaint  Patient presents with  . Code Sepsis   Level 5 caveat: Altered mental status   HPI [name] Good is a 67 y.o. female.  HPI Patient is a 67 year old female who was found by her husband sitting on the commode with some confusion and generalized weakness and was too weak to get off the commode in the middle the night and thus EMS was contacted.  Patient presented to the emergency department with a blood pressure of 78/47.  Patient was found to have an area of erythema and swelling of the left arm.  Husband reports that that began yesterday and worsened as the evening went on.  He try to convince his wife to come the emergency department but she would not.  When he found her on the commode he called 911.  Patient is on chronic prednisone.  Blood sugar on arrival was 62.  History of MI, GERD, osteoarthritis per the chart.  Patient is able to wake up enough to answer simple questions.  She denies chest pain and shortness of breath at this time.  She denies abdominal pain.  She reports pain in her left lower leg likely from where the IO is currently infusing    Past Medical History:  Diagnosis Date  . Arthritis   . Depression   . GERD (gastroesophageal reflux disease)   . Myocardial infarction (HCC) 91   no visits to cardiac dr(thomas kelly) since 92  . Wound dehiscence    lumbar    Patient Active Problem List   Diagnosis Date Noted  . Former smoker 11/13/2016  . History of restless legs syndrome 11/13/2016  . Nontraumatic incomplete tear of right rotator cuff 08/18/2016  . Autoimmune disease (HCC) 08/12/2016  . Primary osteoarthritis of both knees 08/12/2016  . Primary osteoarthritis of both feet 08/12/2016  . Obesity (BMI 35.0-39.9 without comorbidity) 08/12/2016  . Osteoarthritis of right hip 10/25/2015  .  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 S/P lumbar laminectomy 06/14/2015    Past Surgical History:  Procedure Laterality Date  . APPENDECTOMY     17 yrs  . BREAST SURGERY Left    cyst  . CARDIAC CATHETERIZATION  91  . CHOLECYSTECTOMY     67 yrs old  . LUMBAR LAMINECTOMY/DECOMPRESSION MICRODISCECTOMY N/A 06/14/2015   Procedure: Lumbar Three-Four,Lumbar Four-Five, Lumbar Five-Sacral One Laminectomy;  Surgeon: Tia Alert, MD;  Location: [facility];  Service: Neurosurgery;  Laterality: N/A;  . LUMBAR WOUND DEBRIDEMENT N/A 07/24/2015   Procedure: lumbar wound revision;  Surgeon: Tia Alert, MD;  Location: [facility];  Service: Neurosurgery;  Laterality: N/A;  . TOTAL HIP ARTHROPLASTY Right 10/25/2015   Procedure: RIGHT TOTAL HIP ARTHROPLASTY ANTERIOR APPROACH;  Surgeon: Kathryne Hitch, MD;  Location: [facility];  Service: Orthopedics;  Laterality: Right;  . TUBAL LIGATION       OB History   None      Home Medications    Prior to Admission medications   Medication Sig Start Date End Date Taking? Authorizing Provider  aspirin EC 325 MG EC tablet Take 1 tablet (325 mg total) by mouth 2 (two) times daily after a meal. 10/28/15   Kathryne Hitch, MD  calcium carbonate (TUMS EX) 750 MG chewable tablet Chew 1 tablet by mouth 2 (two) times  daily as needed for heartburn.    [provider]  hydroxychloroquine (PLAQUENIL) 200 MG tablet Take 1 tablet (200 mg total) by mouth 2 (two) times daily. Monday through Friday 08/18/16   Pollyann Savoy, MD  ibuprofen (ADVIL,MOTRIN) 800 MG tablet  05/19/16   [provider]  Liniments Chinita Pester) PADS Apply 1 each topically daily.    [provider]  methocarbamol (ROBAXIN) 500 MG tablet Take 1 tablet (500 mg total) by mouth every 6 (six) hours as needed for muscle spasms. Patient not taking: Reported on 07/17/2016 10/28/15   Kathryne Hitch, MD  omeprazole (PRILOSEC) 20 MG capsule Take 20  mg by mouth daily as needed (for acid reflux).     [provider]  oxyCODONE-acetaminophen (PERCOCET) 10-325 MG tablet Take 1 tablet by mouth every 4 (four) hours as needed for pain. Patient not taking: Reported on 07/17/2016 10/28/15   Kathryne Hitch, MD  phentermine (ADIPEX-P) 37.5 MG tablet Take 37.5 mg by mouth daily before breakfast.    [provider]  predniSONE (DELTASONE) 1 MG tablet Take 4 mg daily along with 5 mg tablet for total dose of 9 mg, then continue to taper as instructioned 08/18/16   Pollyann Savoy, MD  predniSONE (DELTASONE) 5 MG tablet Take 1 tablet (5 mg total) by mouth daily with breakfast. 08/18/16   Pollyann Savoy, MD  pseudoephedrine (SUDAFED) 30 MG tablet Take 30 mg by mouth daily as needed for congestion.    [provider]  rOPINIRole (REQUIP) 3 MG tablet  07/10/16   [provider]  sertraline (ZOLOFT) 100 MG tablet Take 100 mg by mouth daily.    [provider]  sodium chloride (OCEAN) 0.65 % SOLN nasal spray Place 1 spray into both nostrils as needed for congestion.    [provider]  traMADol Janean Sark) 50 MG tablet  04/10/16   [provider]    Family History No family history on file.  Social History Social History   Tobacco Use  . Smoking status: Former Smoker    Packs/day: 2.00    Years: 40.00    Pack years: 80.00    Types: Cigarettes    Last attempt to quit: 04/10/2013    Years since quitting: 4.5  . Smokeless tobacco: Never Used  Substance Use Topics  . Alcohol use: Yes    Comment: rarely  . Drug use: No     Allergies   Amoxicillin   Review of Systems Review of Systems  Unable to perform ROS: Mental status change     Physical Exam Updated Vital Signs BP (!) 70/58   Pulse (!) 103   Temp (!) 97.3 F (36.3 C)   Resp (!) 22   Wt 91.2 kg (201 lb)   SpO2 100%   BMI 35.61 kg/m    Physical Exam  Constitutional: She is oriented to person, place, and time.  She appears well-developed and well-nourished. No distress.  HENT:  Head: Normocephalic and atraumatic.  Eyes: EOM are normal.  Neck: Normal range of motion.  Cardiovascular: Normal rate and regular rhythm.  Pulmonary/Chest: Effort normal and breath sounds normal.  Abdominal: Soft. She exhibits no distension. There is no tenderness.  Musculoskeletal: Normal range of motion.  Swelling of the anterior forearm extending up to the level of the left mid humerus with warmth and erythema.  No obvious fluctuance.  Full range of motion of left elbow.  Normal left radial pulse.  No crepitus  Neurological: She is alert  and oriented to person, place, and time.  Skin: Skin is warm and dry.  Psychiatric: She has a normal mood and affect. Judgment normal.  Nursing note and vitals reviewed.    ED Treatments / Results  Labs (all labs ordered are listed, but only abnormal results are displayed) Labs Reviewed  CBC WITH DIFFERENTIAL/PLATELET - Abnormal; Notable for the following components:      Result Value   WBC 22.3 (*)    RBC 3.11 (*)    Hemoglobin 9.9 (*)    HCT 30.3 (*)    All other components within normal limits  PROTIME-INR - Abnormal; Notable for the following components:   Prothrombin Time 18.0 (*)    All other components within normal limits  URINALYSIS, ROUTINE W REFLEX MICROSCOPIC - Abnormal; Notable for the following components:   Color, Urine AMBER (*)    APPearance CLOUDY (*)    Hgb urine dipstick SMALL (*)    Bilirubin Urine SMALL (*)    Protein, ur >=300 (*)    Leukocytes, UA MODERATE (*)    Bacteria, UA MANY (*)    Non Squamous Epithelial 0-5 (*)    All other components within normal limits  I-STAT CG4 LACTIC ACID, ED - Abnormal; Notable for the following components:   Lactic Acid, Venous 4.00 (*)    All other components within normal limits  CBG MONITORING, ED - Abnormal; Notable for the following components:   Glucose-Capillary 62 (*)    All other components within  normal limits  I-STAT CG4 LACTIC ACID, ED - Abnormal; Notable for the following components:   Lactic Acid, Venous 4.56 (*)    All other components within normal limits  CULTURE, BLOOD (ROUTINE X 2)  CULTURE, BLOOD (ROUTINE X 2)  URINE CULTURE  COMPREHENSIVE METABOLIC PANEL  I-STAT CG4 LACTIC ACID, ED    EKG None  Radiology No results found.  Procedures .Critical Care Performed by: Azalia Bilisampos, Gradyn Shein, MD Authorized by: Azalia Bilisampos, Dandrea Widdowson, MD     CRITICAL CARE Performed by: Azalia BilisKevin Benny Deutschman Total critical care time: 45 minutes Critical care time was exclusive of separately billable procedures and treating other patients. Critical care was necessary to treat or prevent imminent or life-threatening deterioration. Critical care was time spent personally by me on the following activities: development of treatment plan with patient and/or surrogate as well as nursing, discussions with consultants, evaluation of patient's response to treatment, examination of patient, obtaining history from patient or surrogate, ordering and performing treatments and interventions, ordering and review of laboratory studies, ordering and review of radiographic studies, pulse oximetry and re-evaluation of patient's condition.   Medications Ordered in ED Medications  vancomycin (VANCOCIN) 1,500 mg in sodium chloride 0.9 % 500 mL IVPB (1,500 mg Intravenous New Bag/Given 10/16/17 0614)  dextrose 50 % solution 50 mL (50 mLs Intravenous Given 10/16/17 0534)  sodium chloride 0.9 % bolus 1,000 mL (0 mLs Intravenous Stopped 10/16/17 0638)    And  sodium chloride 0.9 % bolus 1,000 mL (1,000 mLs Intravenous New Bag/Given 10/16/17 0609)    And  sodium chloride 0.9 % bolus 1,000 mL (1,000 mLs Intravenous New Bag/Given 10/16/17 0609)  piperacillin-tazobactam (ZOSYN) IVPB 3.375 g (0 g Intravenous Stopped 10/16/17 0638)  norepinephrine (LEVOPHED) 4mg  in D5W 250mL premix infusion (10 mcg/min Intravenous New Bag/Given 10/16/17 0552)    lactated ringers bolus 2,000 mL (2,000 mLs Intravenous New Bag/Given 10/16/17 0633)  dextrose 50 % solution 50 mL (50 mLs Intravenous Given 10/16/17 0545)     Initial Impression /  Assessment and Plan / ED Course  I have reviewed the triage vital signs and the nursing notes.  Pertinent labs & imaging results that were available during my care of the patient were reviewed by me and considered in my medical decision making (see chart for details).     Patient presents with hypotension and hypoglycemia with mild hypothermia concerning for sepsis with left forearm and left upper arm cellulitis is the likely source.  No obvious drainable abscess at this time.  Patient's mental status is improving with blood pressure improvements.  Initially slightly responding to IV fluids but now not responding.  Initiation of levo fed.  Patient is on chronic prednisone and therefore will be given a stress dose of Solu-Cortef.  Family updated.  Chest x-ray pending.  X-ray of the left arm pending to evaluate for gas-forming organism.  No crepitus on exam however.  Abdominal exam seems nontender.  Patient will be admitted to the intensive care unit given presentation of septic shock requiring IV pressor support at this time.  Lactate noted to be 4.56.  Final Clinical Impressions(s) / ED Diagnoses   Final diagnoses:  Infection  Septic shock (HCC)  Cellulitis of arm, left  Hypoglycemia  Hypothermia, initial encounter    ED Discharge Orders    None       Azalia Bilis, MD 10/16/17 [phone]

## 2017-10-16 NOTE — H&P (Signed)
PULMONARY / CRITICAL CARE MEDICINE   Name: [name] Good MRN: [id number] DOB: 01-22-1951    ADMISSION DATE:  10/16/2017 CONSULTATION DATE: October 16, 2017  REFERRING MD: Dr. Patria Maneampos  CHIEF COMPLAINT: Left elbow pain  HISTORY OF PRESENT ILLNESS:   67 year old lady with a past medical history significant for coronary artery disease came to our facility with confusion, left arm pain and redness.  The patient's husband provides the history because she was confused.  He says that yesterday she had a small area of skin breakdown over the forearm on the left side.  Overnight the redness dramatically increased in size and she was confused so he brought her to the emergency room.  In the emergency room she was noted to be hypotensive and septic so she was administered IV fluids, vasopressors, and antibiotics.  Pulmonary and critical care medicine was admitted.  She was unable to provide further history due to confusion.  PAST MEDICAL HISTORY :  She  has a past medical history of Arthritis, Depression, GERD (gastroesophageal reflux disease), Myocardial infarction (HCC) (91), and Wound dehiscence.  PAST SURGICAL HISTORY: She  has a past surgical history that includes Cardiac catheterization (91); Breast surgery (Left); Cholecystectomy; Tubal ligation; Appendectomy; Lumbar laminectomy/decompression microdiscectomy (N/A, 06/14/2015); Lumbar wound debridement (N/A, 07/24/2015); and Total hip arthroplasty (Right, 10/25/2015).  Allergies  Allergen Reactions  . Amoxicillin Other (See Comments)    Has patient had a PCN reaction causing immediate rash, facial/tongue/throat swelling, SOB or lightheadedness with hypotension: No Has patient had a PCN reaction causing severe rash involving mucus membranes or skin necrosis: No Has patient had a PCN reaction that required hospitalization No Has patient had a PCN reaction occurring within the last 10 years: No If all of the above answers are "NO", then may proceed with  Cephalosporin use.  Unknown reaction Pt states it made her feel very sick    No current facility-administered medications on file prior to encounter.    Current Outpatient Medications on File Prior to Encounter  Medication Sig  . aspirin EC 325 MG EC tablet Take 1 tablet (325 mg total) by mouth 2 (two) times daily after a meal.  . calcium carbonate (TUMS EX) 750 MG chewable tablet Chew 1 tablet by mouth 2 (two) times daily as needed for heartburn.  . hydroxychloroquine (PLAQUENIL) 200 MG tablet Take 1 tablet (200 mg total) by mouth 2 (two) times daily. Monday through Friday  . ibuprofen (ADVIL,MOTRIN) 800 MG tablet   . Liniments (SALONPAS) PADS Apply 1 each topically daily.  . methocarbamol (ROBAXIN) 500 MG tablet Take 1 tablet (500 mg total) by mouth every 6 (six) hours as needed for muscle spasms. (Patient not taking: Reported on 07/17/2016)  . omeprazole (PRILOSEC) 20 MG capsule Take 20 mg by mouth daily as needed (for acid reflux).   Marland Kitchen. oxyCODONE-acetaminophen (PERCOCET) 10-325 MG tablet Take 1 tablet by mouth every 4 (four) hours as needed for pain. (Patient not taking: Reported on 07/17/2016)  . phentermine (ADIPEX-P) 37.5 MG tablet Take 37.5 mg by mouth daily before breakfast.  . predniSONE (DELTASONE) 1 MG tablet Take 4 mg daily along with 5 mg tablet for total dose of 9 mg, then continue to taper as instructioned  . predniSONE (DELTASONE) 5 MG tablet Take 1 tablet (5 mg total) by mouth daily with breakfast.  . pseudoephedrine (SUDAFED) 30 MG tablet Take 30 mg by mouth daily as needed for congestion.  Marland Kitchen. rOPINIRole (REQUIP) 3 MG tablet   . sertraline (ZOLOFT) 100  MG tablet Take 100 mg by mouth daily.  . sodium chloride (OCEAN) 0.65 % SOLN nasal spray Place 1 spray into both nostrils as needed for congestion.  . traMADol (ULTRAM) 50 MG tablet     FAMILY HISTORY/SOCIAL HISTORY/REVIEW OF SYSTEMS:   Unable to obtain due to confusion   SUBJECTIVE:  As above  VITAL SIGNS: BP (!)  107/44   Pulse (!) 102   Temp (!) 97.3 F (36.3 C)   Resp 20   Wt 91.2 kg (201 lb)   SpO2 100%   BMI 35.61 kg/m   HEMODYNAMICS:    VENTILATOR SETTINGS:    INTAKE / OUTPUT: No intake/output data recorded.  PHYSICAL EXAMINATION:  General:  Resting comfortably in bed HENT: NCAT OP clear PULM: CTA B, normal effort CV: RRR, no mgr GI: BS+, soft, nontender Derm: redness left forearm, no palpable crepitance, no palpable fluid collections Neuro: Confused, moving around with all four extr   LABS:  BMET No results for input(s): NA, K, CL, CO2, BUN, CREATININE, GLUCOSE in the last 168 hours.  Electrolytes No results for input(s): CALCIUM, MG, PHOS in the last 168 hours.  CBC Recent Labs  Lab 10/16/17 0605  WBC 22.3*  HGB 9.9*  HCT 30.3*  PLT 174    Coag's Recent Labs  Lab 10/16/17 0605  INR 1.50    Sepsis Markers Recent Labs  Lab 10/16/17 0540 10/16/17 0620  LATICACIDVEN 4.00* 4.56*    ABG No results for input(s): PHART, PCO2ART, PO2ART in the last 168 hours.  Liver Enzymes No results for input(s): AST, ALT, ALKPHOS, BILITOT, ALBUMIN in the last 168 hours.  Cardiac Enzymes No results for input(s): TROPONINI, PROBNP in the last 168 hours.  Glucose Recent Labs  Lab 10/16/17 0524  GLUCAP 62*    Imaging No results found.   STUDIES:    CULTURES: June 22 blood culture June 22 urine culture  ANTIBIOTICS: June 22 vancomycin June 22 ceftriaxone  SIGNIFICANT EVENTS:   LINES/TUBES: June 22 right IJ central venous line  DISCUSSION: 67 year old female with a past medical history significant for coronary artery disease on Plaquenil and prednisone for uncertain reason (both the patient and her husband tell me that she has "not many medical problems") who is here with septic shock from cellulitis.  Her physical exam is not consistent with a necrotizing infection or septic arthritis at this time.  I suspect her immunosuppression from Plaquenil  and prednisone are contributing to both the severity of her hypotension and the rapidity of the progression of the cellulitis.  She needs admission to the intensive care unit.  ASSESSMENT / PLAN:  PULMONARY A: No acute issues P:   Monitor respiratory status in ICU  CARDIOVASCULAR A:  Septic shock History of coronary artery disease P:  Telemetry monitoring CVP monitoring after receiving 30 cc of crystalloid Levo fed titrated to maintain mean arterial pressure greater than 65 Continue aspirin  RENAL A:   Acute kidney injury Hypokalemia, I question whether or not this was a diluted specimen P:   Repeat basic metabolic panel Monitor urine output  GASTROINTESTINAL A:   History of gastroesophageal reflux disease P:   Continue pantoprazole  HEMATOLOGIC A:   No acute issues P:  Monitor for bleeding  INFECTIOUS A:   Septic shock due to cellulitis, most likely organism is strep or staph P:   Vancomycin, ceftriaxone as above Follow cultures Hold Plaquenil  ENDOCRINE A:    hyperglycemia, likely due to stress response Prednisone dependent, 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

## 2017-10-16 NOTE — ED Notes (Signed)
Called lab to inform code sepsis labs.

## 2017-10-17 LAB — BASIC METABOLIC PANEL
Anion gap: 7 (ref 5–15)
BUN: 19 mg/dL (ref 6–20)
CO2: 22 mmol/L (ref 22–32)
Calcium: 7.6 mg/dL — ABNORMAL LOW (ref 8.9–10.3)
Chloride: 115 mmol/L — ABNORMAL HIGH (ref 101–111)
Creatinine, Ser: 0.93 mg/dL (ref 0.44–1.00)
GFR calc Af Amer: >60 mL/min (ref >60)
GFR calc non Af Amer: >60 mL/min (ref >60)
Glucose, Bld: 99 mg/dL (ref 65–99)
Potassium: 3.1 mmol/L — ABNORMAL LOW (ref 3.5–5.1)
Sodium: 144 mmol/L (ref 135–145)

## 2017-10-17 LAB — GLUCOSE, CAPILLARY
Glucose-Capillary: 107 mg/dL — ABNORMAL HIGH (ref 65–99)
Glucose-Capillary: 162 mg/dL — ABNORMAL HIGH (ref 65–99)
Glucose-Capillary: 73 mg/dL (ref 65–99)
Glucose-Capillary: 75 mg/dL (ref 65–99)
Glucose-Capillary: 79 mg/dL (ref 65–99)
Glucose-Capillary: 85 mg/dL (ref 65–99)

## 2017-10-17 LAB — MAGNESIUM: Magnesium: 1.7 mg/dL (ref 1.7–2.4)

## 2017-10-17 LAB — CBC
HCT: 34.2 % — ABNORMAL LOW (ref 36.0–46.0)
Hemoglobin: 11.3 g/dL — ABNORMAL LOW (ref 12.0–15.0)
MCH: 31.7 pg (ref 26.0–34.0)
MCHC: 33 g/dL (ref 30.0–36.0)
MCV: 96.1 fL (ref 78.0–100.0)
Platelets: 219 10*3/uL (ref 150–400)
RBC: 3.56 MIL/uL — ABNORMAL LOW (ref 3.87–5.11)
RDW: 13.1 % (ref 11.5–15.5)
WBC: 23.5 10*3/uL — ABNORMAL HIGH (ref 4.0–10.5)

## 2017-10-17 LAB — PHOSPHORUS: Phosphorus: 3.4 mg/dL (ref 2.5–4.6)

## 2017-10-17 LAB — HIV ANTIBODY (ROUTINE TESTING W REFLEX): HIV Screen 4th Generation wRfx: NONREACTIVE

## 2017-10-17 MED ORDER — VANCOMYCIN HCL IN DEXTROSE 1-5 GM/200ML-% IV SOLN
1000.0000 mg | Freq: Two times a day (BID) | INTRAVENOUS | Status: DC
  Administered 2017-10-17 – 2017-10-19 (×5): 1000 mg via INTRAVENOUS
  Filled 2017-10-17 (×7): qty 200

## 2017-10-17 MED ORDER — HYDROCORTISONE NA SUCCINATE PF 100 MG IJ SOLR
50.0000 mg | Freq: Two times a day (BID) | INTRAMUSCULAR | Status: DC
  Administered 2017-10-17 (×2): 50 mg via INTRAVENOUS
  Filled 2017-10-17 (×2): qty 2

## 2017-10-17 MED ORDER — INSULIN ASPART 100 UNIT/ML ~~LOC~~ SOLN: 0.0000 [IU] | Freq: Every day | SUBCUTANEOUS | Status: DC

## 2017-10-17 MED ORDER — POTASSIUM CHLORIDE 10 MEQ/50ML IV SOLN
10.0000 meq | INTRAVENOUS | Status: AC
  Administered 2017-10-17 (×6): 10 meq via INTRAVENOUS
  Filled 2017-10-17 (×6): qty 50

## 2017-10-17 MED ORDER — SODIUM CHLORIDE 0.9 % IV BOLUS: 500.0000 mL | Freq: Once | INTRAVENOUS | Status: DC

## 2017-10-17 MED ORDER — INSULIN ASPART 100 UNIT/ML ~~LOC~~ SOLN
0.0000 [IU] | Freq: Three times a day (TID) | SUBCUTANEOUS | Status: DC
  Administered 2017-10-17: 3 [IU] via SUBCUTANEOUS
  Administered 2017-10-18: 1 [IU] via SUBCUTANEOUS
  Administered 2017-10-19: 2 [IU] via SUBCUTANEOUS

## 2017-10-17 NOTE — Progress Notes (Signed)
eLink Physician-Brief Progress Note Patient Name: [name] Good DOB: 21-Oct-1950 MRN: [id number]   Date of Service  10/17/2017  HPI/Events of Note  Potassium 3.1  eICU Interventions  replaced     Intervention Category Minor Interventions: Electrolytes abnormality - evaluation and management  Henry RusselSMITH, [place], P 10/17/2017, 5:23 AM

## 2017-10-17 NOTE — Progress Notes (Signed)
Pharmacy Antibiotic Note  [name] Good is a 67 y.o. female admitted on 10/16/2017 with left forearm cellulitis.  Pharmacy has been consulted for Vancomycin dosing.  WBC up slightly today to 23.5 and patient afebrile. SCr improved to <1 and CrCl ~ 60 ml/min.   Plan: Adjust vancomycin to 1000 mg every 12 hours with improved renal function Monitor renal function, clinical, improvement, vanc trough as appropraite  Height: 5\' 1"  (154.9 cm) Weight: 230 lb 9.6 oz (104.6 kg) IBW/kg (Calculated) : 47.8  Temp (24hrs), Avg:98.2 F (36.8 C), Min:97.5 F (36.4 C), Max:98.5 F (36.9 C)  Recent Labs  Lab 10/16/17 0540 10/16/17 0605 10/16/17 0620 10/16/17 0948 10/16/17 1106 10/17/17 0404  WBC  --  22.3*  --   --   --  23.5*  CREATININE  --  1.42*  --   --  1.35* 0.93  LATICACIDVEN 4.00*  --  4.56* 4.3* 3.0*  --     Estimated Creatinine Clearance: 64.4 mL/min (by C-G formula based on SCr of 0.93 mg/dL).    [name] Good, PharmD, BCPS PGY2 Infectious Diseases Pharmacy Resident [phone] 10/17/2017 1:04 PM

## 2017-10-17 NOTE — H&P (Addendum)
PULMONARY / CRITICAL CARE MEDICINE   Name: [name] Good MRN: [id number] DOB: 1950-07-13    ADMISSION DATE:  10/16/2017 CONSULTATION DATE: October 16, 2017  REFERRING MD: Dr. Patria Mane  CHIEF COMPLAINT: Left elbow pain  HISTORY OF PRESENT ILLNESS:   67 year old lady with a past medical history significant for coronary artery disease came to our facility with confusion, left arm pain and redness.  The patient's husband provides the history because she was confused.  He says that yesterday she had a small area of skin breakdown over the forearm on the left side.  Overnight the redness dramatically increased in size and she was confused so he brought her to the emergency room.  In the emergency room she was noted to be hypotensive and septic so she was administered IV fluids, vasopressors, and antibiotics.  Pulmonary and critical care medicine was admitted.  She was unable to provide further history due to confusion.    SUBJECTIVE:  Decreased confusion and agitation  Awake, alert and following commands Left arm increased swelling and redness  Weaned off pressors   Improved renal fxn and increased UOP   VITAL SIGNS: BP (!) 88/55 (BP Location: Right Wrist) Comment: sleeping with cuff above head  Pulse 98   Temp 98.5 F (36.9 C) (Oral)   Resp 19   Ht 5\' 1"  (1.549 m)   Wt 230 lb 9.6 oz (104.6 kg)   SpO2 98%   BMI 43.57 kg/m   HEMODYNAMICS: CVP:  [11 mmHg-16 mmHg] 11 mmHg  VENTILATOR SETTINGS:    INTAKE / OUTPUT: I/O last 3 completed shifts: In: 8616.2 [I.V.:4765.9; IV Piggyback:3850.3] Out: 1485 [Urine:1485]  PHYSICAL EXAMINATION:  General:  Awake and alert in bed, calm  HENT: NCAT , moist oral mucosa  PULM: CTA , no wheezing  CV: RRR , no mrg GI: BS + , NT , +BS  Derm: Left forearm with significant redness, swelling , + pulses , mild weeping at wrist area Neuro: alert , f/c    LABS:  BMET Recent Labs  Lab 10/16/17 0605 10/16/17 1106 10/17/17 0404  NA 142 142 144   K 2.4* 3.5 3.1*  CL 118* 114* 115*  CO2 14* 20* 22  BUN 15 16 19   CREATININE 1.42* 1.35* 0.93  GLUCOSE 142* 125* 99    Electrolytes Recent Labs  Lab 10/16/17 0605 10/16/17 1106 10/17/17 0404  CALCIUM 6.0* 7.5* 7.6*  MG  --   --  1.7  PHOS  --   --  3.4    CBC Recent Labs  Lab 10/16/17 0605 10/17/17 0404  WBC 22.3* 23.5*  HGB 9.9* 11.3*  HCT 30.3* 34.2*  PLT 174 219    Coag's Recent Labs  Lab 10/16/17 0605  INR 1.50    Sepsis Markers Recent Labs  Lab 10/16/17 0620 10/16/17 0948 10/16/17 1106  LATICACIDVEN 4.56* 4.3* 3.0*  PROCALCITON  --   --  8.62    ABG No results for input(s): PHART, PCO2ART, PO2ART in the last 168 hours.  Liver Enzymes Recent Labs  Lab 10/16/17 0605  AST 29  ALT 20  ALKPHOS 49  BILITOT 0.6  ALBUMIN 1.8*    Cardiac Enzymes No results for input(s): TROPONINI, PROBNP in the last 168 hours.  Glucose Recent Labs  Lab 10/16/17 1155 10/16/17 1621 10/16/17 1941 10/17/17 0011 10/17/17 0349 10/17/17 0758  GLUCAP 117* 106* 89 85 75 79    Imaging No results found.   STUDIES:    CULTURES: June 22 blood culture>not  done  June 22 urine culture> not done   ANTIBIOTICS: June 22 vancomycin June 22 ceftriaxone  SIGNIFICANT EVENTS:   LINES/TUBES: June 22 right IJ central venous line  DISCUSSION: 67 year old female with a past medical history significant for coronary artery disease on Plaquenil and prednisone for uncertain reason (both the patient and her husband tell me that she has "not many medical problems") who is here with septic shock from cellulitis.  Her physical exam is not consistent with a necrotizing infection or septic arthritis at this time.  I suspect her immunosuppression from Plaquenil and prednisone are contributing to both the severity of her hypotension and the rapidity of the progression of the cellulitis.  She needs admission to the intensive care unit.  ASSESSMENT /  PLAN:  PULMONARY A: Hypoxia in setting of sepsis  P:    Keep O2 sats >90%, wean o2 as able   CARDIOVASCULAR A:  Septic shock-improving -weaned off pressors 6/22  History of coronary artery disease LA trending down  P:  Telemetry monitoring  Continue aspirin  RENAL A:   Acute kidney injury>improved , scr 1.3 >0.9  Hypokalemia,  P:   K replaced  Monitor urine output Cont IVF   GASTROINTESTINAL A:   History of gastroesophageal reflux disease P:   Continue pantoprazole Begin diet   HEMATOLOGIC A:   Anemia  P:  Monitor for bleeding  INFECTIOUS A:   Septic shock due to cellulitis (left arm)  most likely organism is strep or staph Left forearm xray neg  P:   Vancomycin, ceftriaxone as above Follow cultures Hold Plaquenil  ENDOCRINE A:    hyperglycemia, likely due to stress response Prednisone dependent, 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

## 2017-10-18 DIAGNOSIS — M1990 Unspecified osteoarthritis, unspecified site: Secondary | ICD-10-CM

## 2017-10-18 DIAGNOSIS — L03114 Cellulitis of left upper limb: Secondary | ICD-10-CM

## 2017-10-18 DIAGNOSIS — R6521 Severe sepsis with septic shock: Secondary | ICD-10-CM

## 2017-10-18 DIAGNOSIS — A419 Sepsis, unspecified organism: Principal | ICD-10-CM

## 2017-10-18 LAB — CBC
HCT: 33.8 % — ABNORMAL LOW (ref 36.0–46.0)
Hemoglobin: 11.2 g/dL — ABNORMAL LOW (ref 12.0–15.0)
MCH: 31.8 pg (ref 26.0–34.0)
MCHC: 33.1 g/dL (ref 30.0–36.0)
MCV: 96 fL (ref 78.0–100.0)
Platelets: 189 10*3/uL (ref 150–400)
RBC: 3.52 MIL/uL — ABNORMAL LOW (ref 3.87–5.11)
RDW: 13.2 % (ref 11.5–15.5)
WBC: 22 10*3/uL — ABNORMAL HIGH (ref 4.0–10.5)

## 2017-10-18 LAB — BASIC METABOLIC PANEL
Anion gap: 7 (ref 5–15)
BUN: 14 mg/dL (ref 6–20)
CO2: 23 mmol/L (ref 22–32)
Calcium: 8.1 mg/dL — ABNORMAL LOW (ref 8.9–10.3)
Chloride: 112 mmol/L — ABNORMAL HIGH (ref 101–111)
Creatinine, Ser: 0.66 mg/dL (ref 0.44–1.00)
GFR calc Af Amer: >60 mL/min (ref >60)
GFR calc non Af Amer: >60 mL/min (ref >60)
Glucose, Bld: 118 mg/dL — ABNORMAL HIGH (ref 65–99)
Potassium: 3.5 mmol/L (ref 3.5–5.1)
Sodium: 142 mmol/L (ref 135–145)

## 2017-10-18 LAB — LACTIC ACID, PLASMA: Lactic Acid, Venous: 1.2 mmol/L (ref 0.5–1.9)

## 2017-10-18 LAB — GLUCOSE, CAPILLARY
Glucose-Capillary: 132 mg/dL — ABNORMAL HIGH (ref 65–99)
Glucose-Capillary: 119 mg/dL — ABNORMAL HIGH (ref 65–99)
Glucose-Capillary: 133 mg/dL — ABNORMAL HIGH (ref 65–99)
Glucose-Capillary: 139 mg/dL — ABNORMAL HIGH (ref 65–99)

## 2017-10-18 MED ORDER — IPRATROPIUM-ALBUTEROL 0.5-2.5 (3) MG/3ML IN SOLN
3.0000 mL | Freq: Four times a day (QID) | RESPIRATORY_TRACT | Status: DC
  Administered 2017-10-18: 3 mL via RESPIRATORY_TRACT
  Filled 2017-10-18: qty 3

## 2017-10-18 MED ORDER — SODIUM CHLORIDE 0.9% FLUSH: 10.0000 mL | INTRAVENOUS | Status: DC | PRN

## 2017-10-18 MED ORDER — ROPINIROLE HCL 1 MG PO TABS
3.0000 mg | ORAL_TABLET | ORAL | Status: DC
  Administered 2017-10-18 – 2017-10-19 (×2): 3 mg via ORAL
  Filled 2017-10-18 (×2): qty 3

## 2017-10-18 MED ORDER — IPRATROPIUM-ALBUTEROL 0.5-2.5 (3) MG/3ML IN SOLN
3.0000 mL | Freq: Three times a day (TID) | RESPIRATORY_TRACT | Status: DC
  Administered 2017-10-19 (×2): 3 mL via RESPIRATORY_TRACT
  Filled 2017-10-18 (×3): qty 3

## 2017-10-18 MED ORDER — HYDROCORTISONE NA SUCCINATE PF 100 MG IJ SOLR
50.0000 mg | Freq: Every day | INTRAMUSCULAR | Status: DC
  Administered 2017-10-18 – 2017-10-19 (×2): 50 mg via INTRAVENOUS
  Filled 2017-10-18 (×2): qty 2

## 2017-10-18 NOTE — Progress Notes (Signed)
PROGRESS NOTE    [name] Good  UJW:[id number] DOB: [birth year] DOA: 10/16/2017 PCP: Kaleen MaskElkins, Wilson Oliver, MD   Brief Narrative: Patient is a 67 year old female with past medical history of arthritis, coronary artery disease who presented to the emergency department with confusion, left arm pain and redness.  Patient was also reported to be confused at home.  Husband reported that she had a small area of skin breakdown over her forearm and the left side.  The redness continued to increase in size and she became progressive confused  and then  had to be brought to the emergency department.  She was noted to be hypotensive in the emergency department.  Sepsis was suspected and she was started on IV fluids, vasopressors and antibiotics.  PCCM admitted the patient.  Currently the blood pressure has stabilized.  Patient has been transferred to hospitalist service on 10/18/17.  Assessment & Plan:   Active Problems:   Septic shock (HCC)  Septic shock: Present on admission.  Started on vasopressors on presentation.  Currently blood pressure stable.  IV fluids have been discontinued today because she has developed some shortness of breath and wheezing. She had to be started on pressors.  Off pressors now.  Started on IV steroids for  hypotension.  We will follow-up blood cultures.  Patient is afebrile this morning.  White cell count stable.  Lactic acid level normal.  Cellulitis of the left upper extremity: Most likely this was the cause of sepsis.  Will check venous duplex to rule out DVT.  Redness and pain and edema of the left upper extremity has been improving.  Continue antibiotics.  Currently she is on vancomycin and ceftriaxone.  Altered mental status: Secondary to metabolic encephalopathy secondary to sepsis.  Mental status is much improved this morning.  Acute kidney injury: Improved with IV fluids.  History of GERD: Continue pantoprazole.  Generalized weakness: Patient remains very weak.   Will request for physical therapy evaluation.  History of arthritis: Denies any diagnosis of rheumatoid arthritis, gout, SLE.  She says that she has been experimented with medications for arthritis.  She is on Plaquenil and prednisone at home.  DVT prophylaxis:Heparin [place] Code Status: Full Family Communication: None present at the bed side Disposition Plan: Home after resolution of cellulitis, sepsis   Consultants: PCCM  Procedures: None  Antimicrobials: Vancomycin and ceftriaxone since 10/16/2017  Subjective: Patient seen and examined the bedside this morning.  Hemodynamically stable.  Looks very anxious.  Has some mild shortness of breath.  Cellulitis has improved.  Objective: Vitals:   10/17/17 2206 10/18/17 0500 10/18/17 0552 10/18/17 1310  BP: 117/76  122/82 129/76  Pulse: 89  88 90  Resp:    20  Temp: 98.1 F (36.7 C)  98.1 F (36.7 C) 98 F (36.7 C)  TempSrc: Oral  Oral Oral  SpO2: 99%  99% 97%  Weight:  106.7 kg (235 lb 3.7 oz)    Height:        Intake/Output Summary (Last 24 hours) at 10/18/2017 1422 Last data filed at 10/18/2017 1000 Gross per 24 hour  Intake 731.63 ml  Output -  Net 731.63 ml   Filed Weights   10/16/17 1815 10/17/17 0500 10/18/17 0500  Weight: 91.2 kg (201 lb) 104.6 kg (230 lb 9.6 oz) 106.7 kg (235 lb 3.7 oz)    Examination:  General exam: Not in distress, obese, generalized weakness HEENT:PERRL,Oral mucosa moist, Ear/Nose normal on gross exam, moon face Respiratory system: Bilateral mild expiratory wheezes,  decreased air entry in the bases  cardiovascular system: S1 & S2 heard, RRR. No JVD, murmurs, rubs, gallops or clicks. No pedal edema. Gastrointestinal system: Abdomen is nondistended, soft and nontender. No organomegaly or masses felt. Normal bowel sounds heard. Central nervous system: Alert and oriented. No focal neurological deficits. Extremities: Edema, erythema and mild tenderness of the left upper extremity Skin: No rashes,  lesions or ulcers,no icterus ,no pallor MSK: Normal muscle bulk,tone ,power Psychiatry: Judgement and insight appear normal. Mood & affect appropriate.     Data Reviewed: I have personally reviewed following labs and imaging studies  CBC: Recent Labs  Lab 10/16/17 0605 10/17/17 0404 10/18/17 0529  WBC 22.3* 23.5* 22.0*  NEUTROABS 20.5*  --   --   HGB 9.9* 11.3* 11.2*  HCT 30.3* 34.2* 33.8*  MCV 97.4 96.1 96.0  PLT 174 219 189   Basic Metabolic Panel: Recent Labs  Lab 10/16/17 0605 10/16/17 1106 10/17/17 0404 10/18/17 0529  NA 142 142 144 142  K 2.4* 3.5 3.1* 3.5  CL 118* 114* 115* 112*  CO2 14* 20* 22 23  GLUCOSE 142* 125* 99 118*  BUN 15 16 19 14   CREATININE 1.42* 1.35* 0.93 0.66  CALCIUM 6.0* 7.5* 7.6* 8.1*  MG  --   --  1.7  --   PHOS  --   --  3.4  --    GFR: Estimated Creatinine Clearance: 75.9 mL/min (by C-G formula based on SCr of 0.66 mg/dL). Liver Function Tests: Recent Labs  Lab 10/16/17 0605  AST 29  ALT 20  ALKPHOS 49  BILITOT 0.6  PROT 3.6*  ALBUMIN 1.8*   No results for input(s): LIPASE, AMYLASE in the last 168 hours. No results for input(s): AMMONIA in the last 168 hours. Coagulation Profile: Recent Labs  Lab 10/16/17 0605  INR 1.50   Cardiac Enzymes: No results for input(s): CKTOTAL, CKMB, CKMBINDEX, TROPONINI in the last 168 hours. BNP (last 3 results) No results for input(s): PROBNP in the last 8760 hours. HbA1C: No results for input(s): HGBA1C in the last 72 hours. CBG: Recent Labs  Lab 10/17/17 1135 10/17/17 1645 10/17/17 2207 10/18/17 0805 10/18/17 1202  GLUCAP 73 162* 107* 132* 119*   Lipid Profile: No results for input(s): CHOL, HDL, LDLCALC, TRIG, CHOLHDL, LDLDIRECT in the last 72 hours. Thyroid Function Tests: No results for input(s): TSH, T4TOTAL, FREET4, T3FREE, THYROIDAB in the last 72 hours. Anemia Panel: No results for input(s): VITAMINB12, FOLATE, FERRITIN, TIBC, IRON, RETICCTPCT in the last 72  hours. Sepsis Labs: Recent Labs  Lab 10/16/17 0620 10/16/17 0948 10/16/17 1106 10/18/17 0529  PROCALCITON  --   --  8.62  --   LATICACIDVEN 4.56* 4.3* 3.0* 1.2    Recent Results (from the past 240 hour(s))  MRSA PCR Screening     Status: None   Collection Time: 10/16/17  9:19 AM  Result Value Ref Range Status   MRSA by PCR NEGATIVE NEGATIVE Final    Comment:        The GeneXpert MRSA Assay (FDA approved for NASAL specimens only), is one component of a comprehensive MRSA colonization surveillance program. It is not intended to diagnose MRSA infection nor to guide or monitor treatment for MRSA infections. Performed at [facility], [address], [place], [place] [address]          Radiology Studies: No results found.      Scheduled Meds: . aspirin  325 mg Oral BID PC  . heparin  5,000 Units Subcutaneous Q8H  . hydrocortisone sodium succinate  50 mg Intravenous Daily  . insulin aspart  0-15 Units Subcutaneous TID WC  . insulin aspart  0-5 Units Subcutaneous QHS  . mouth rinse  15 mL Mouth Rinse BID  . pantoprazole  40 mg Oral Daily  . rOPINIRole  3 mg Oral Q24H  . sertraline  100 mg Oral Daily   Continuous Infusions: . sodium chloride Stopped (10/17/17 1416)  . sodium chloride    . cefTRIAXone (ROCEPHIN)  IV 1 g (10/18/17 0802)  . vancomycin 1,000 mg (10/18/17 0547)     LOS: 2 days    Time spent: 35 mins.More than 50% of that time was spent in counseling and/or coordination of care.      Burnadette Pop, MD [facility] Pager [phone]  If 7PM-7AM, please contact night-coverage [email or web address] Password [email or web address] 10/18/2017, 2:22 PM

## 2017-10-18 NOTE — Progress Notes (Signed)
Patient refused her ropinirole morning dose because it makes her sleepy.  She takes it at home at 2:00 PM

## 2017-10-18 NOTE — Evaluation (Signed)
Physical Therapy Evaluation Patient Details Name: [name] Good Zollars MRN: [id number] DOB: 1950/06/09 Today's Date: 10/18/2017   History of Present Illness  67 year old lady with a past medical history significant for coronary artery disease came to our facility with confusion, left arm pain and redness.  In the emergency room she was noted to be hypotensive and septic so she was administered IV fluids, vasopressors, and antibiotics. PMH: arthritis, depression, GERC PSH: L breast surgery, R THA  Clinical Impression  Pt admitted with above. Pt was indep with her cane PTA but now is deconditioned from sepsis. Pt functioning at minA at this time. Pt unable to don socks or perform pericare at this time as well. Pt with limited use of L UE as well. Pt reports her oldest daughter can stay with her upon discharge. Will progress ambulation next session.    Follow Up Recommendations Home health PT;Supervision/Assistance - 24 hour    Equipment Recommendations  None recommended by PT(has DME)    Recommendations for Other Services       Precautions / Restrictions Precautions Precautions: Fall Precaution Comments: swelling t/o L UE,abdomen and bilat LEs Restrictions Weight Bearing Restrictions: No      Mobility  Bed Mobility Overal bed mobility: Needs Assistance Bed Mobility: Supine to Sit     Supine to sit: Min assist     General bed mobility comments: pt able to bring LEs off EOB, increasd time, minA for trunk elevation due to body habitus and inability to use L UE to push/pull due to pain  Transfers Overall transfer level: Needs assistance Equipment used: Rolling walker (2 wheeled) Transfers: Sit to/from [employer]Stand;Stand Pivot Transfers Sit to Stand: Min assist Stand pivot transfers: Min assist       General transfer comment: pt [facility], pt then used RW to complete std pvt [facility]. increased time, minA to power up and maintain balance  during [facility]  Ambulation/Gait             General Gait Details: limited to chair and BSC this date, limited by fatigue  Stairs            Wheelchair Mobility    Modified Rankin (Stroke Patients Only)       Balance Overall balance assessment: Needs assistance Sitting-balance support: Feet supported;No upper extremity supported Sitting balance-Leahy Scale: Fair     Standing balance support: Single extremity supported Standing balance-Leahy Scale: Fair Standing balance comment: unable to perform pericare s/p BM, required bilat UE support while PT performed                             Pertinent Vitals/Pain Pain Assessment: No/denies pain    Home Living Family/patient expects to be discharged to:: Private residence Living Arrangements: Spouse/significant other Available Help at Discharge: Family;Available PRN/intermittently Type of Home: Mobile home Home Access: Stairs to enter Entrance Stairs-Rails: Left Entrance Stairs-Number of Steps: 4 Home Layout: One level Home Equipment: Cane - single point;Walker - 4 wheels      Prior Function Level of Independence: Independent with assistive device(s)         Comments: use the cane, rollator outside, drives - does [employer] pick up     Hand Dominance   Dominant Hand: Right    Extremity/Trunk Assessment   Upper Extremity Assessment Upper Extremity Assessment: Generalized weakness;LUE deficits/detail LUE Deficits / Details: ROM  limited due to swelling and pain    Lower Extremity Assessment Lower Extremity Assessment: Generalized weakness    Cervical / Trunk Assessment Cervical / Trunk Assessment: Normal  Communication   Communication: No difficulties  Cognition Arousal/Alertness: Awake/alert Behavior During Therapy: WFL for tasks assessed/performed Overall Cognitive Status: Within Functional Limits for tasks assessed                                         General Comments General comments (skin integrity, edema, etc.): abdominal, bilat LE, and L UE edema, area of drainage on L forearm    Exercises     Assessment/Plan    PT Assessment Patient needs continued PT services  PT Problem List Decreased strength;Decreased range of motion;Decreased activity tolerance;Decreased balance;Decreased mobility;Decreased coordination;Decreased knowledge of use of DME;Decreased safety awareness;Decreased knowledge of precautions;Obesity       PT Treatment Interventions DME instruction;Gait training;Stair training;Functional mobility training;Therapeutic activities;Therapeutic exercise;Balance training;Neuromuscular re-education    PT Goals (Current goals can be found in the Care Plan section)  Acute Rehab PT Goals Patient Stated Goal: home PT Goal Formulation: With patient Time For Goal Achievement: 11/01/17 Potential to Achieve Goals: Good    Frequency Min 3X/week   Barriers to discharge Decreased caregiver support spouse works however reports her oldest daughter can stay with her    Co-evaluation               AM-PAC PT "6 Clicks" Daily Activity  Outcome Measure Difficulty turning over in bed (including adjusting bedclothes, sheets and blankets)?: A Little Difficulty moving from lying on back to sitting on the side of the bed? : A Little Difficulty sitting down on and standing up from a chair with arms (e.g., wheelchair, bedside commode, etc,.)?: A Little Help needed moving to and from a bed to chair (including a wheelchair)?: A Little Help needed walking in hospital room?: A Little Help needed climbing 3-5 steps with a railing? : A Lot 6 Click Score: 17    End of Session   Activity Tolerance: Patient limited by fatigue Patient left: in chair;with call bell/phone within reach Nurse Communication: Mobility status PT Visit Diagnosis: Unsteadiness on feet (R26.81);Repeated falls (R29.6)    Time: 1337-1410 PT Time  Calculation (min) (ACUTE ONLY): 33 min   Charges:   PT Evaluation $PT Eval Moderate Complexity: 1 Mod PT Treatments $Therapeutic Activity: 8-22 mins   PT G Codes:        Lewis Shock, PT, DPT Pager #: [phone] Office #: [phone]   Lisa Milian M Abayomi Pattison 10/18/2017, 2:25 PM

## 2017-10-19 ENCOUNTER — Inpatient Hospital Stay (HOSPITAL_COMMUNITY): Payer: Medicare Other

## 2017-10-19 DIAGNOSIS — M7989 Other specified soft tissue disorders: Secondary | ICD-10-CM

## 2017-10-19 LAB — CBC WITH DIFFERENTIAL/PLATELET
Abs Immature Granulocytes: 0.1 10*3/uL (ref 0.0–0.1)
Basophils Absolute: 0.1 10*3/uL (ref 0.0–0.1)
Basophils Relative: 0 %
Eosinophils Absolute: 0.2 10*3/uL (ref 0.0–0.7)
Eosinophils Relative: 1 %
HCT: 34 % — ABNORMAL LOW (ref 36.0–46.0)
Hemoglobin: 10.8 g/dL — ABNORMAL LOW (ref 12.0–15.0)
Immature Granulocytes: 1 %
Lymphocytes Relative: 13 %
Lymphs Abs: 1.8 10*3/uL (ref 0.7–4.0)
MCH: 31.3 pg (ref 26.0–34.0)
MCHC: 31.8 g/dL (ref 30.0–36.0)
MCV: 98.6 fL (ref 78.0–100.0)
Monocytes Absolute: 0.9 10*3/uL (ref 0.1–1.0)
Monocytes Relative: 6 %
Neutro Abs: 10.7 10*3/uL — ABNORMAL HIGH (ref 1.7–7.7)
Neutrophils Relative %: 79 %
Platelets: 195 10*3/uL (ref 150–400)
RBC: 3.45 MIL/uL — ABNORMAL LOW (ref 3.87–5.11)
RDW: 13.2 % (ref 11.5–15.5)
WBC: 13.6 10*3/uL — ABNORMAL HIGH (ref 4.0–10.5)

## 2017-10-19 LAB — GLUCOSE, CAPILLARY
Glucose-Capillary: 75 mg/dL (ref 70–99)
Glucose-Capillary: 71 mg/dL (ref 70–99)
Glucose-Capillary: 121 mg/dL — ABNORMAL HIGH (ref 70–99)

## 2017-10-19 MED ORDER — DOXYCYCLINE HYCLATE 100 MG PO CAPS: 100.0000 mg | ORAL_CAPSULE | Freq: Two times a day (BID) | ORAL | 0 refills | Status: DC

## 2017-10-19 MED ORDER — CEPHALEXIN 500 MG PO CAPS: 500.0000 mg | ORAL_CAPSULE | Freq: Four times a day (QID) | ORAL | 0 refills | Status: AC

## 2017-10-19 MED ORDER — ASPIRIN 325 MG PO TBEC: 325.0000 mg | DELAYED_RELEASE_TABLET | Freq: Every day | ORAL | 0 refills | Status: DC

## 2017-10-19 NOTE — Care Management Important Message (Signed)
Important Message  Patient Details  Name: [name] Good MRN: [id number] Date of Birth: [birth year]   Medicare Important Message Given:  Yes    Dorena BodoIris Tamarah Bhullar 10/19/2017, 3:52 PM

## 2017-10-19 NOTE — Care Management Note (Signed)
Case Management Note  Patient Details  Name: [name] Good MRN: [id number] Date of Birth: March 16, 1951  Subjective/Objective:       Septic shock. Resides with family.            Lezlie LyeJerry W Bearman (Spouse) Lamarr LulasBrenda Russell (Daughter)    [phone]      PCP: Windle GuardWilson Elkins  Action/Plan: Transition to home with family. Pt declined home health PT ,PT's recommendation.  States daughter to provide transportation to home.  Expected Discharge Date:  10/19/17               Expected Discharge Plan:  Home w Home Health Services  In-House Referral:     Discharge planning Services  CM Consult  Post Acute Care Choice:    Choice offered to:   patient  DME Arranged:    DME Agency:     HH Arranged:   declined home health services (PT) HH Agency:     Status of Service:  Completed, signed off  If discussed at Long Length of Stay Meetings, dates discussed:    Additional Comments:  Epifanio LeschesCole, Jyles Sontag Hudson, RN 10/19/2017, 4:50 PM

## 2017-10-19 NOTE — Progress Notes (Signed)
Discharge instructions provided to patient and daughter. Questions answered, no present concern. Central line D/C'd by IV team, site unremarkable. Pt denies pain or distress. Discharged via wheelchair with daughter, condition stable

## 2017-10-19 NOTE — Progress Notes (Signed)
Physical Therapy Treatment Patient Details Name: [name] Good MRN: [id number] DOB: 04-03-1951 Today's Date: 10/19/2017    History of Present Illness 67 year old lady with a past medical history significant for coronary artery disease came to our facility with confusion, left arm pain and redness.  In the emergency room she was noted to be hypotensive and septic so she was administered IV fluids, vasopressors, and antibiotics. PMH: arthritis, depression, GERC PSH: L breast surgery, Rt anterior THA (2017-[facility]).     PT Comments    Pt progressing well this session, high level of independence with bed mobility, transfers and AMB. Pt still requires min-modA for standing from a low (standard) surface height, and requires maximal effort to supine-to-sitting EOB, but does tolerate AMB this date, 2 bouts, 65ft and 107ft. Pt has heavy support at home. Pt is unable to perform stairs this date d/t weakness and fatigue, but endorses confidence that family can assist her into the house safely. Progress toward goals noted.     Follow Up Recommendations  Home health PT;Supervision/Assistance - 24 hour     Equipment Recommendations  Rolling walker with 5" wheels(pt currently has a rollator, but woudl like a RW for additional stability. )    Recommendations for Other Services       Precautions / Restrictions Precautions Precautions: Fall Restrictions Weight Bearing Restrictions: No    Mobility  Bed Mobility Overal bed mobility: Needs Assistance Bed Mobility: Supine to Sit     Supine to sit: Supervision     General bed mobility comments: requires maximal effort, pt bearing down with substantial facial erythema, good balance once at EOB   Transfers Overall transfer level: Needs assistance Equipment used: Rolling walker (2 wheeled) Transfers: Sit to/from Stand Sit to Stand: Mod assist;Supervision(ModA from gerichair, supervision from elevated EOB )         General transfer comment:  heavy UE use on RW   Ambulation/Gait Ambulation/Gait assistance: Supervision Gait Distance (Feet): 36 Feet(43ft, seated rest, 14ft) Assistive device: Rolling walker (2 wheeled)       General Gait Details: DOE (per baseline), foot pain, needs rest seated. Alternates AMB adn retroAMB without LOB. Safe use of RW noted.    Stairs             Wheelchair Mobility    Modified Rankin (Stroke Patients Only)       Balance Overall balance assessment: No apparent balance deficits (not formally assessed)         Standing balance support: During functional activity;Bilateral upper extremity supported Standing balance-Leahy Scale: Fair                              Cognition Arousal/Alertness: Awake/alert Behavior During Therapy: WFL for tasks assessed/performed Overall Cognitive Status: Within Functional Limits for tasks assessed                                        Exercises      General Comments        Pertinent Vitals/Pain Pain Assessment: 0-10 Pain Score: 5  Pain Location: Left arm, at red areas. tender to touch Pain Intervention(s): Limited activity within patient's tolerance;Monitored during session;Repositioned    Home Living                      Prior Function  PT Goals (current goals can now be found in the care plan section) Acute Rehab PT Goals Patient Stated Goal: home PT Goal Formulation: With patient Time For Goal Achievement: 11/01/17 Potential to Achieve Goals: Good Progress towards PT goals: Progressing toward goals    Frequency    Min 3X/week      PT Plan Current plan remains appropriate    Co-evaluation              AM-PAC PT "6 Clicks" Daily Activity  Outcome Measure  Difficulty turning over in bed (including adjusting bedclothes, sheets and blankets)?: A Little Difficulty moving from lying on back to sitting on the side of the bed? : A Lot Difficulty sitting down on and  standing up from a chair with arms (e.g., wheelchair, bedside commode, etc,.)?: Unable Help needed moving to and from a bed to chair (including a wheelchair)?: A Little Help needed walking in hospital room?: A Lot Help needed climbing 3-5 steps with a railing? : A Lot 6 Click Score: 13    End of Session   Activity Tolerance: Patient limited by fatigue Patient left: in chair;with call bell/phone within reach;with nursing/sitter in room Nurse Communication: Mobility status PT Visit Diagnosis: Unsteadiness on feet (R26.81);Repeated falls (R29.6)     Time: [id number] PT Time Calculation (min) (ACUTE ONLY): 21 min  Charges:  $Therapeutic Activity: 8-22 mins                    G Codes:       3:18 PM, 10/19/17 Rosamaria LintsAllan C Buccola, PT, DPT Physical Therapist - [facility] [phone] (Pager)  [phone] (Office)      Buccola,Allan C 10/19/2017, 3:15 PM

## 2017-10-19 NOTE — Progress Notes (Signed)
Left upper extremity venous duplex has been completed. Negative for DVT.  10/19/17 3:51 PM Olen CordialGreg Aharon Carriere RVT

## 2017-10-19 NOTE — Discharge Summary (Addendum)
Physician Discharge Summary  [name] Good FIE:[id number] DOB: [birth year] DOA: 10/16/2017  PCP: [name] Mask, MD  Admit date: 10/16/2017 Discharge date: 10/19/2017  Admitted From: Home Disposition:  Home  Discharge Condition:Stable CODE STATUS:FULL Diet recommendation: Heart Healthy  Brief/Interim Summary:  Patient is a 67 year old female with past medical history of arthritis, coronary artery disease who presented to the emergency department with confusion, left arm pain and redness.  Patient was also reported to be confused at home.  Husband reported that she had a small area of skin breakdown over her forearm and the left side.  The redness continued to increase in size and she became progressive confused  and then  had to be brought to the emergency department.  She was noted to be hypotensive in the emergency department.  Sepsis was suspected and she was started on IV fluids, vasopressors and antibiotics.  PCCM admitted the patient. Currently the blood pressure has stabilized.  Patient has been transferred to hospitalist service on 10/18/17. Patient's hospital course has remained stable.  Currently she is afebrile.  Her blood pressure is stable.White cell counts have been improving.  Patient was also significantly weak and was evaluated by physical therapy who recommended home health PT. Patient is being discharged to home today with oral antibiotics.  Following problems were  addressed during hospitalization:  Septic shock: Present on admission.  Started on vasopressors on presentation.  Currently blood pressure stable.  IV fluids have been discontinued  because she has developed some shortness of breath and wheezing. She had to be started on pressors on admission.  Off pressors now.  Started on IV steroids for  hypotension ,which has been discontinued.  Patient is afebrile this morning.  White cell count stable.  Lactic acid level normal.  Cellulitis of the left upper  extremity: Most likely this was the cause of sepsis.  Venous duplex to rule out DVT done and it did not show DVT. Redness and pain and edema of the left upper extremity has been improving.    Currently she is on vancomycin and ceftriaxone.  We will change that  to doxycycline and Keflex on discharge.  Altered mental status: Secondary to metabolic encephalopathy secondary to sepsis.  Mental status has much improved and she is alert and oriented  Acute kidney injury: Resolved with IV fluids.  History of GERD: Continue PPI  Generalized weakness: Complained of generalized weakness .Physical therapy recommended HHPT.  History of arthritis: Denies any diagnosis of rheumatoid arthritis, gout, SLE.  She says that she has been experimented with medications for arthritis.  She is on Plaquenil and prednisone at home.  Discharge Diagnoses:  Principal Problem:   Septic shock (HCC) Active Problems:   Obesity (BMI 35.0-39.9 without comorbidity)   Cellulitis of left upper extremity   Arthritis    Discharge Instructions  Discharge Instructions    Diet - low sodium heart healthy   Complete by:  As directed    Discharge instructions   Complete by:  As directed    1) Take prescribed medications as instructed. 2) Follow up with your PCP as an outpatient. Do a CBC and BMP test in a week during the follow-up.   Increase activity slowly   Complete by:  As directed      Allergies as of 10/19/2017      Reactions   Amoxicillin Other (See Comments)   Tolerated Zosyn Has patient had a PCN reaction causing immediate rash, facial/tongue/throat swelling, SOB or lightheadedness with hypotension: No  Has patient had a PCN reaction causing severe rash involving mucus membranes or skin necrosis: No Has patient had a PCN reaction that required hospitalization No Has patient had a PCN reaction occurring within the last 10 years: No If all of the above answers are "NO", then may proceed with Cephalosporin  use.      Medication List    TAKE these medications   aspirin 325 MG EC tablet Take 1 tablet (325 mg total) by mouth daily. What changed:  when to take this   calcium carbonate 750 MG chewable tablet Commonly known as:  TUMS EX Chew 1 tablet by mouth 2 (two) times daily as needed for heartburn.   cephALEXin 500 MG capsule Commonly known as:  KEFLEX Take 1 capsule (500 mg total) by mouth 4 (four) times daily for 6 days. Start taking on:  10/20/2017   doxycycline 100 MG capsule Commonly known as:  VIBRAMYCIN Take 1 capsule (100 mg total) by mouth 2 (two) times daily. Start taking on:  10/20/2017   omeprazole 20 MG capsule Commonly known as:  PRILOSEC Take 20 mg by mouth daily as needed (for acid reflux).   phentermine 37.5 MG tablet Commonly known as:  ADIPEX-P Take 37.5 mg by mouth daily as needed (appetite).   predniSONE 5 MG tablet Commonly known as:  DELTASONE Take 1 tablet (5 mg total) by mouth daily with breakfast. What changed:  how much to take   rOPINIRole 3 MG tablet Commonly known as:  REQUIP Take 3 mg by mouth at bedtime as needed (restless leg).   sertraline 100 MG tablet Commonly known as:  ZOLOFT Take 100 mg by mouth daily.      Follow-up Information    [name] MaskElkins, Wilson Oliver, MD. Schedule an appointment as soon as possible for a visit in 1 week(s).   Specialty:  Family Medicine Contact information: [address] [place] [id number] [phone]          Allergies  Allergen Reactions  . Amoxicillin Other (See Comments)    Tolerated Zosyn Has patient had a PCN reaction causing immediate rash, facial/tongue/throat swelling, SOB or lightheadedness with hypotension: No Has patient had a PCN reaction causing severe rash involving mucus membranes or skin necrosis: No Has patient had a PCN reaction that required hospitalization No Has patient had a PCN reaction occurring within the last 10 years: No If all of the above answers are "NO",  then may proceed with Cephalosporin use.    Consultations:  PCCM   Procedures/Studies: Dg Forearm Left  Result Date: 10/16/2017 CLINICAL DATA:  Left arm pain and weakness. EXAM: LEFT FOREARM - 2 VIEW COMPARISON:  None. FINDINGS: No evidence of fracture or dislocation. Moderate degenerate change of the first carpometacarpal joint. IMPRESSION: No acute findings. Electronically Signed   By: Elberta Fortisaniel  Boyle M.D.   On: 10/16/2017 08:16   Dg Chest Portable 1 View  Result Date: 10/16/2017 CLINICAL DATA:  Central line placement. EXAM: PORTABLE CHEST 1 VIEW COMPARISON:  10/16/2017 earlier. FINDINGS: Interval placement of right IJ central venous catheter with tip over the SVC. Small amount of air within the soft tissues at the access site in the right neck. Lungs are adequately inflated without lobar consolidation, effusion or pneumothorax. Subtle stable prominence of the perihilar markings likely mild vascular congestion. Remainder of the exam is unchanged. IMPRESSION: Stable minimal vascular congestion. Right IJ central venous catheter with tip over the SVC. No pneumothorax. Electronically Signed   By: Elberta Fortisaniel  Boyle M.D.  On: 10/16/2017 08:30   Dg Chest Portable 1 View  Result Date: 10/16/2017 CLINICAL DATA:  Sepsis.  Left arm pain and weakness. EXAM: PORTABLE CHEST 1 VIEW COMPARISON:  None. FINDINGS: Lungs are adequately inflated without focal lobar consolidation or effusion. There is subtle prominence of the perihilar markings which may be due to mild degree of vascular congestion. Cardiomediastinal silhouette is unremarkable. There is calcified plaque over the thoracic aorta. Mild degenerate change of the spine. IMPRESSION: Suggestion mild vascular congestion. Electronically Signed   By: Elberta Fortis M.D.   On: 10/16/2017 08:13      Subjective:  Patient seen and examined at bedside this morning.  Remains comfortable.  Appears much better today.  Left upper arm cellulitis has been improving.   Redness and swelling has decreased.  Discharge Exam: Vitals:   10/19/17 1455 10/19/17 1500  BP:    Pulse: (!) 113   Resp:  (!) 22  Temp:    SpO2: 94% 94%   Vitals:   10/19/17 0940 10/19/17 1347 10/19/17 1455 10/19/17 1500  BP:  (!) 160/93    Pulse:  87 (!) 113   Resp:  20  (!) 22  Temp:  98.5 F (36.9 C)    TempSrc:      SpO2: 96% 96% 94% 94%  Weight:      Height:        General: Pt is alert, awake, not in acute distress Cardiovascular: RRR, S1/S2 +, no rubs, no gallops Respiratory: CTA bilaterally, no wheezing, no rhonchi Abdominal: Soft, NT, ND, bowel sounds + Extremities: Erythema, tenderness and edema of the left arm which is improving   The results of significant diagnostics from this hospitalization (including imaging, microbiology, ancillary and laboratory) are listed below for reference.     Microbiology: Recent Results (from the past 240 hour(s))  MRSA PCR Screening     Status: None   Collection Time: 10/16/17  9:19 AM  Result Value Ref Range Status   MRSA by PCR NEGATIVE NEGATIVE Final    Comment:        The GeneXpert MRSA Assay (FDA approved for NASAL specimens only), is one component of a comprehensive MRSA colonization surveillance program. It is not intended to diagnose MRSA infection nor to guide or monitor treatment for MRSA infections. Performed at [facility], [address], [place], [place] [address]      Labs: BNP (last 3 results) No results for input(s): BNP in the last 8760 hours. Basic Metabolic Panel: Recent Labs  Lab 10/16/17 0605 10/16/17 1106 10/17/17 0404 10/18/17 0529  NA 142 142 144 142  K 2.4* 3.5 3.1* 3.5  CL 118* 114* 115* 112*  CO2 14* 20* 22 23  GLUCOSE 142* 125* 99 118*  BUN 15 16 19 14   CREATININE 1.42* 1.35* 0.93 0.66  CALCIUM 6.0* 7.5* 7.6* 8.1*  MG  --   --  1.7  --   PHOS  --   --  3.4  --    Liver Function Tests: Recent Labs  Lab 10/16/17 0605  AST 29  ALT 20  ALKPHOS 49  BILITOT 0.6   PROT 3.6*  ALBUMIN 1.8*   No results for input(s): LIPASE, AMYLASE in the last 168 hours. No results for input(s): AMMONIA in the last 168 hours. CBC: Recent Labs  Lab 10/16/17 0605 10/17/17 0404 10/18/17 0529 10/19/17 0512  WBC 22.3* 23.5* 22.0* 13.6*  NEUTROABS 20.5*  --   --  10.7*  HGB 9.9* 11.3*  11.2* 10.8*  HCT 30.3* 34.2* 33.8* 34.0*  MCV 97.4 96.1 96.0 98.6  PLT 174 219 189 195   Cardiac Enzymes: No results for input(s): CKTOTAL, CKMB, CKMBINDEX, TROPONINI in the last 168 hours. BNP: Invalid input(s): POCBNP CBG: Recent Labs  Lab 10/18/17 1202 10/18/17 1622 10/18/17 2145 10/19/17 0826 10/19/17 1145  GLUCAP 119* 133* 139* 75 71   D-Dimer No results for input(s): DDIMER in the last 72 hours. Hgb A1c No results for input(s): HGBA1C in the last 72 hours. Lipid Profile No results for input(s): CHOL, HDL, LDLCALC, TRIG, CHOLHDL, LDLDIRECT in the last 72 hours. Thyroid function studies No results for input(s): TSH, T4TOTAL, T3FREE, THYROIDAB in the last 72 hours.  Invalid input(s): FREET3 Anemia work up No results for input(s): VITAMINB12, FOLATE, FERRITIN, TIBC, IRON, RETICCTPCT in the last 72 hours. Urinalysis    Component Value Date/Time   COLORURINE AMBER (A) 10/16/2017 0608   APPEARANCEUR CLOUDY (A) 10/16/2017 0608   LABSPEC 1.019 10/16/2017 0608   PHURINE 5.0 10/16/2017 0608   GLUCOSEU NEGATIVE 10/16/2017 0608   HGBUR SMALL (A) 10/16/2017 0608   BILIRUBINUR SMALL (A) 10/16/2017 0608   KETONESUR NEGATIVE 10/16/2017 0608   PROTEINUR >=300 (A) 10/16/2017 0608   NITRITE NEGATIVE 10/16/2017 0608   LEUKOCYTESUR MODERATE (A) 10/16/2017 0608   Sepsis Labs Invalid input(s): PROCALCITONIN,  WBC,  LACTICIDVEN Microbiology Recent Results (from the past 240 hour(s))  MRSA PCR Screening     Status: None   Collection Time: 10/16/17  9:19 AM  Result Value Ref Range Status   MRSA by PCR NEGATIVE NEGATIVE Final    Comment:        The GeneXpert MRSA Assay  (FDA approved for NASAL specimens only), is one component of a comprehensive MRSA colonization surveillance program. It is not intended to diagnose MRSA infection nor to guide or monitor treatment for MRSA infections. Performed at [facility], [address] [place], [place] [address]     Please note: You were cared for by a hospitalist during your hospital stay. Once you are discharged, your primary care physician will handle any further medical issues. Please note that NO REFILLS for any discharge medications will be authorized once you are discharged, as it is imperative that you return to your primary care physician (or establish a relationship with a primary care physician if you do not have one) for your post hospital discharge needs so that they can reassess your need for medications and monitor your lab values.    Time coordinating discharge: 40 minutes  SIGNED:   Burnadette Pop, MD  [facility] 10/19/2017, 4:19 PM Pager [phone]  If 7PM-7AM, please contact night-coverage [email or web address] Password [email or web address]

## 2017-10-24 LAB — CULTURE, BLOOD (ROUTINE X 2)
Culture: NO GROWTH
Culture: NO GROWTH
Special Requests: ADEQUATE

## 2017-11-08 ENCOUNTER — Ambulatory Visit (INDEPENDENT_AMBULATORY_CARE_PROVIDER_SITE_OTHER): Payer: Medicare Other | Admitting: Orthopaedic Surgery

## 2017-11-09 ENCOUNTER — Ambulatory Visit (INDEPENDENT_AMBULATORY_CARE_PROVIDER_SITE_OTHER): Payer: Medicare Other | Admitting: Orthopaedic Surgery

## 2017-11-09 ENCOUNTER — Encounter (INDEPENDENT_AMBULATORY_CARE_PROVIDER_SITE_OTHER): Payer: Self-pay | Admitting: Orthopaedic Surgery

## 2017-11-09 DIAGNOSIS — M1711 Unilateral primary osteoarthritis, right knee: Secondary | ICD-10-CM | POA: Diagnosis not present

## 2017-11-09 DIAGNOSIS — M170 Bilateral primary osteoarthritis of knee: Secondary | ICD-10-CM

## 2017-11-09 DIAGNOSIS — M1712 Unilateral primary osteoarthritis, left knee: Secondary | ICD-10-CM | POA: Diagnosis not present

## 2017-11-09 MED ORDER — LIDOCAINE HCL 1 % IJ SOLN
0.5000 mL | INTRAMUSCULAR | Status: AC | PRN
  Administered 2017-11-09: .5 mL

## 2017-11-09 MED ORDER — METHYLPREDNISOLONE ACETATE 40 MG/ML IJ SUSP
40.0000 mg | INTRAMUSCULAR | Status: AC | PRN
  Administered 2017-11-09: 40 mg via INTRA_ARTICULAR

## 2017-11-09 NOTE — Progress Notes (Signed)
   Procedure Note  Patient: Octaviano GlowBrenda J Domke             Date of Birth: 1951-01-30           MRN: [id number]             Visit Date: 11/09/2017  HPI: Ms. Nickola MajorHawkes is well-known to Dr. Magnus IvanBlackman service comes in today for cortisone injections bilateral knees.  She has moderate arthritis of both knees.  She is had no new injury to either knee.  She underwent Monovisc injections 08/03/17 bilateral knees and does not feel this really helped.  Physical exam: Bilateral knees no effusion abnormal warmth erythema.  She has full extension and flexion beyond 90 degrees.  Procedures: Visit Diagnoses: Primary osteoarthritis of both knees  Large Joint Inj: bilateral knee on 11/09/2017 11:16 AM Indications: pain Details: 25 G 1.5 in needle, anterolateral approach  Arthrogram: No  Medications (Right): 0.5 mL lidocaine 1 %; 40 mg methylPREDNISolone acetate 40 MG/ML Medications (Left): 0.5 mL lidocaine 1 %; 40 mg methylPREDNISolone acetate 40 MG/ML Outcome: tolerated well, no immediate complications Procedure, treatment alternatives, risks and benefits explained, specific risks discussed. Consent was given by the patient. Immediately prior to procedure a time out was called to verify the correct patient, procedure, equipment, support staff and site/side marked as required. Patient was prepped and draped in the usual sterile fashion.     Plan: She understands that she can only have cortisone injections in the knees every 3 months.  She like to go ahead and reschedule for repeat steroid injections and we will see her back in 3 months.  Continue to work on quad strengthening exercises.

## 2018-02-09 ENCOUNTER — Ambulatory Visit (INDEPENDENT_AMBULATORY_CARE_PROVIDER_SITE_OTHER): Payer: Medicare Other | Admitting: Orthopaedic Surgery

## 2018-02-09 ENCOUNTER — Encounter (INDEPENDENT_AMBULATORY_CARE_PROVIDER_SITE_OTHER): Payer: Self-pay | Admitting: Orthopaedic Surgery

## 2018-02-09 DIAGNOSIS — M1712 Unilateral primary osteoarthritis, left knee: Secondary | ICD-10-CM

## 2018-02-09 DIAGNOSIS — M25562 Pain in left knee: Secondary | ICD-10-CM

## 2018-02-09 DIAGNOSIS — M25561 Pain in right knee: Secondary | ICD-10-CM

## 2018-02-09 DIAGNOSIS — G8929 Other chronic pain: Secondary | ICD-10-CM

## 2018-02-09 DIAGNOSIS — M1711 Unilateral primary osteoarthritis, right knee: Secondary | ICD-10-CM

## 2018-02-09 MED ORDER — LIDOCAINE HCL 1 % IJ SOLN
3.0000 mL | INTRAMUSCULAR | Status: AC | PRN
  Administered 2018-02-09: 3 mL

## 2018-02-09 MED ORDER — METHYLPREDNISOLONE ACETATE 40 MG/ML IJ SUSP
40.0000 mg | INTRAMUSCULAR | Status: AC | PRN
  Administered 2018-02-09: 40 mg via INTRA_ARTICULAR

## 2018-02-09 NOTE — Progress Notes (Signed)
Office Visit Note   Patient: [name] Good           Date of Birth: April 18, 1951           MRN: [id number] Visit Date: 02/09/2018              Requested by: Kaleen Mask, MD [address] [place], [place] [address] PCP: Kaleen Mask, MD   Assessment & Plan: Visit Diagnoses:  1. Unilateral primary osteoarthritis, left knee   2. Unilateral primary osteoarthritis, right knee   3. Chronic pain of right knee   4. Chronic pain of left knee     Plan: At this point I did provide steroid injections in both her knees and counseled about the risk minutes of these injections.  She is going to talk to her primary care physician about her neuropathy.  Do feel that she may be a candidate for medication such as Neurontin and I communicated this with her and would like her to communicate this with her primary care physician.  Follow-Up Instructions: Return if symptoms worsen or fail to improve.   Orders:  Orders Placed This Encounter  Procedures  . Large Joint Inj  . Large Joint Inj   No orders of the defined types were placed in this encounter.     Procedures: Large Joint Inj: R knee on 02/09/2018 10:59 AM Indications: diagnostic evaluation and pain Details: 22 G 1.5 in needle, superolateral approach  Arthrogram: No  Medications: 3 mL lidocaine 1 %; 40 mg methylPREDNISolone acetate 40 MG/ML Outcome: tolerated well, no immediate complications Procedure, treatment alternatives, risks and benefits explained, specific risks discussed. Consent was given by the patient. Immediately prior to procedure a time out was called to verify the correct patient, procedure, equipment, support staff and site/side marked as required. Patient was prepped and draped in the usual sterile fashion.   Large Joint Inj: L knee on 02/09/2018 11:00 AM Indications: diagnostic evaluation and pain Details: 22 G 1.5 in needle, superolateral approach  Arthrogram: No  Medications: 3 mL  lidocaine 1 %; 40 mg methylPREDNISolone acetate 40 MG/ML Outcome: tolerated well, no immediate complications Procedure, treatment alternatives, risks and benefits explained, specific risks discussed. Consent was given by the patient. Immediately prior to procedure a time out was called to verify the correct patient, procedure, equipment, support staff and site/side marked as required. Patient was prepped and draped in the usual sterile fashion.       Clinical Data: No additional findings.   Subjective: Chief Complaint  Patient presents with  . Left Knee - Follow-up  . Right Knee - Follow-up  The patient comes in today hoping to have steroid injections both her knees.  She has moderate arthritis of both her knees and autoimmune disease.  She is not a diabetic but she reports worsening peripheral neuropathy for over a year now.  She does ambulate using a quad cane.  She is obese with a BMI of 44.  We have tried hyaluronic acid injections in her knees and this did not help as much as a steroid.  She is on Requip for restless leg syndrome.  She actually sees her primary care physician tomorrow.  HPI  Review of Systems She currently denies any headache, chest pain, shortness of breath, fever, chills, nausea, vomiting.  Objective: Vital Signs: There were no vitals taken for this visit.  Physical Exam She is alert and oriented x3 and in no acute distress Ortho Exam Examination of both knees  show just a slight effusion with no other significant deformities but global pain. Specialty Comments:  No specialty comments available.  Imaging: No results found.   PMFS History: Patient Active Problem List   Diagnosis Date Noted  . Cellulitis of left upper extremity 10/18/2017  . Arthritis 10/18/2017  . Septic shock (HCC) 10/16/2017  . Former smoker 11/13/2016  . History of restless legs syndrome 11/13/2016  . Nontraumatic incomplete tear of right rotator cuff 08/18/2016  . Autoimmune  disease (HCC) 08/12/2016  . Primary osteoarthritis of both knees 08/12/2016  . Primary osteoarthritis of both feet 08/12/2016  . Obesity (BMI 35.0-39.9 without comorbidity) 08/12/2016  . Osteoarthritis of right hip 10/25/2015  . 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 S/P lumbar laminectomy 06/14/2015   Past Medical History:  Diagnosis Date  . Arthritis   . Depression   . GERD (gastroesophageal reflux disease)   . Myocardial infarction (HCC) 91   no visits to cardiac dr(thomas kelly) since 92  . Wound dehiscence    lumbar    No family history on file.  Past Surgical History:  Procedure Laterality Date  . APPENDECTOMY     17 yrs  . BREAST SURGERY Left    cyst  . CARDIAC CATHETERIZATION  91  . CHOLECYSTECTOMY     67 yrs old  . LUMBAR LAMINECTOMY/DECOMPRESSION MICRODISCECTOMY N/A 06/14/2015   Procedure: Lumbar Three-Four,Lumbar Four-Five, Lumbar Five-Sacral One Laminectomy;  Surgeon: Tia Alert, MD;  Location: [facility];  Service: Neurosurgery;  Laterality: N/A;  . LUMBAR WOUND DEBRIDEMENT N/A 07/24/2015   Procedure: lumbar wound revision;  Surgeon: Tia Alert, MD;  Location: [facility];  Service: Neurosurgery;  Laterality: N/A;  . TOTAL HIP ARTHROPLASTY Right 10/25/2015   Procedure: RIGHT TOTAL HIP ARTHROPLASTY ANTERIOR APPROACH;  Surgeon: Kathryne Hitch, MD;  Location: [facility];  Service: Orthopedics;  Laterality: Right;  . TUBAL LIGATION     Social History   Occupational History  . Not on file  Tobacco Use  . Smoking status: Former Smoker    Packs/day: 2.00    Years: 40.00    Pack years: 80.00    Types: Cigarettes    Last attempt to quit: 04/10/2013    Years since quitting: 4.8  . Smokeless tobacco: Never Used  Substance and Sexual Activity  . Alcohol use: Yes    Comment: rarely  . Drug use: No  . Sexual activity: Not on file

## 2018-04-06 ENCOUNTER — Telehealth (INDEPENDENT_AMBULATORY_CARE_PROVIDER_SITE_OTHER): Payer: Self-pay

## 2018-04-06 NOTE — Telephone Encounter (Signed)
[facility] that [facility] Monovisc injection. Reference# [id number] Valid 04/27/2018- 04/27/2019

## 2018-04-08 ENCOUNTER — Ambulatory Visit: Payer: Medicare Other | Admitting: Diagnostic Neuroimaging

## 2018-04-11 ENCOUNTER — Ambulatory Visit (INDEPENDENT_AMBULATORY_CARE_PROVIDER_SITE_OTHER): Payer: Medicare Other | Admitting: Orthopaedic Surgery

## 2018-04-14 ENCOUNTER — Encounter: Payer: Medicare Other | Admitting: Diagnostic Neuroimaging

## 2018-05-04 ENCOUNTER — Encounter (INDEPENDENT_AMBULATORY_CARE_PROVIDER_SITE_OTHER): Payer: Self-pay | Admitting: Orthopaedic Surgery

## 2018-05-04 ENCOUNTER — Ambulatory Visit (INDEPENDENT_AMBULATORY_CARE_PROVIDER_SITE_OTHER): Payer: Self-pay

## 2018-05-04 ENCOUNTER — Ambulatory Visit (INDEPENDENT_AMBULATORY_CARE_PROVIDER_SITE_OTHER): Payer: Medicare Other | Admitting: Orthopaedic Surgery

## 2018-05-04 DIAGNOSIS — M7061 Trochanteric bursitis, right hip: Secondary | ICD-10-CM

## 2018-05-04 DIAGNOSIS — Z96641 Presence of right artificial hip joint: Secondary | ICD-10-CM | POA: Diagnosis not present

## 2018-05-04 MED ORDER — LIDOCAINE HCL 1 % IJ SOLN
3.0000 mL | INTRAMUSCULAR | Status: AC | PRN
  Administered 2018-05-04: 3 mL

## 2018-05-04 MED ORDER — METHYLPREDNISOLONE ACETATE 40 MG/ML IJ SUSP
40.0000 mg | INTRAMUSCULAR | Status: AC | PRN
  Administered 2018-05-04: 40 mg via INTRA_ARTICULAR

## 2018-05-04 NOTE — Progress Notes (Signed)
Office Visit Note   Patient: [name] Good           Date of Birth: [birth year]           MRN: [id number] Visit Date: 05/04/2018              Requested by: Kaleen Mask, MD [address] [place], [place] [address] PCP: Kaleen Mask, MD   Assessment & Plan: Visit Diagnoses:  1. History of right hip replacement   2. Trochanteric bursitis of right hip     Plan: She understands there is a combination of factors that is creating bursitis of her right hip.  Some of this is related to her weight and some of this is related to how she has to ambulate given the severity of valgus malalignment of her knees.  There is also component related to her ligament discrepancy.  I did offer a steroid injection of the trochanteric area and she agreed with this and tolerated it well.  I showed her stretching exercises to try.  Our next up would be physical therapy if she continues to have problems.  She can always have repeat injection about 3 months if needed.  All question concerns were answered and addressed.  Follow-up will be as needed otherwise.  Follow-Up Instructions: Return if symptoms worsen or fail to improve.   Orders:  Orders Placed This Encounter  Procedures  . Large Joint Inj  . XR HIP UNILAT W OR W/O PELVIS 1V RIGHT   No orders of the defined types were placed in this encounter.     Procedures: Large Joint Inj: L greater trochanter on 05/04/2018 1:53 PM Indications: pain and diagnostic evaluation Details: 22 G 1.5 in needle, lateral approach  Arthrogram: No  Medications: 3 mL lidocaine 1 %; 40 mg methylPREDNISolone acetate 40 MG/ML Outcome: tolerated well, no immediate complications Procedure, treatment alternatives, risks and benefits explained, specific risks discussed. Consent was given by the patient. Immediately prior to procedure a time out was called to verify the correct patient, procedure, equipment, support staff and site/side marked as required.  Patient was prepped and draped in the usual sterile fashion.       Clinical Data: No additional findings.   Subjective: Chief Complaint  Patient presents with  . Right Hip - Pain  The patient is someone that we are seeing today to evaluate and treat her right hip pain.  We actually performed a right total hip arthroplasty on her about 2 years ago.  She ambulates with a cane.  She is significantly obese.  She does have a history of bad arthritis in both her knees.  She also has a leg length discrepancy with the right operative side slightly longer than the left side.  This creates a lot of pain around her hip in general.  Recently she is dealt with cellulitis around her right elbow and forearm that have resolved with antibiotics.  HPI  Review of Systems She currently denies any headache, chest pain, shortness of breath, fever, chills, nausea, vomiting  Objective: Vital Signs: There were no vitals taken for this visit.  Physical Exam She is alert and orient x3 and in no acute distress Ortho Exam Examination of her right hip shows that moves fluidly with no pain in the groin at all.  She only has pain to palpation over the trochanteric area.  Both knees have valgus malalignment.  She does have a leg length discrepancy with her right side slightly longer  than her left. Specialty Comments:  No specialty comments available.  Imaging: Xr Hip Unilat W Or W/o Pelvis 1v Right  Result Date: 05/04/2018 An AP pelvis and lateral of the right hip shows a total hip arthroplasty with no evidence of loosening or complicating features.    PMFS History: Patient Active Problem List   Diagnosis Date Noted  . Cellulitis of left upper extremity 10/18/2017  . Arthritis 10/18/2017  . Septic shock (HCC) 10/16/2017  . Former smoker 11/13/2016  . History of restless legs syndrome 11/13/2016  . Nontraumatic incomplete tear of right rotator cuff 08/18/2016  . Autoimmune disease (HCC) 08/12/2016  .  Primary osteoarthritis of both knees 08/12/2016  . Primary osteoarthritis of both feet 08/12/2016  . Obesity (BMI 35.0-39.9 without comorbidity) 08/12/2016  . Osteoarthritis of right hip 10/25/2015  . 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 S/P lumbar laminectomy 06/14/2015   Past Medical History:  Diagnosis Date  . Arthritis   . Depression   . GERD (gastroesophageal reflux disease)   . Myocardial infarction (HCC) 91   no visits to cardiac dr(thomas kelly) since 92  . Wound dehiscence    lumbar    History reviewed. No pertinent family history.  Past Surgical History:  Procedure Laterality Date  . APPENDECTOMY     17 yrs  . BREAST SURGERY Left    cyst  . CARDIAC CATHETERIZATION  91  . CHOLECYSTECTOMY     68 yrs old  . LUMBAR LAMINECTOMY/DECOMPRESSION MICRODISCECTOMY N/A 06/14/2015   Procedure: Lumbar Three-Four,Lumbar Four-Five, Lumbar Five-Sacral One Laminectomy;  Surgeon: Tia Alert, MD;  Location: [facility];  Service: Neurosurgery;  Laterality: N/A;  . LUMBAR WOUND DEBRIDEMENT N/A 07/24/2015   Procedure: lumbar wound revision;  Surgeon: Tia Alert, MD;  Location: [facility];  Service: Neurosurgery;  Laterality: N/A;  . TOTAL HIP ARTHROPLASTY Right 10/25/2015   Procedure: RIGHT TOTAL HIP ARTHROPLASTY ANTERIOR APPROACH;  Surgeon: Kathryne Hitch, MD;  Location: [facility];  Service: Orthopedics;  Laterality: Right;  . TUBAL LIGATION     Social History   Occupational History  . Not on file  Tobacco Use  . Smoking status: Former Smoker    Packs/day: 2.00    Years: 40.00    Pack years: 80.00    Types: Cigarettes    Last attempt to quit: 04/10/2013    Years since quitting: 5.0  . Smokeless tobacco: Never Used  Substance and Sexual Activity  . Alcohol use: Yes    Comment: rarely  . Drug use: No  . Sexual activity: Not on file

## 2018-06-01 ENCOUNTER — Ambulatory Visit: Payer: Medicare Other | Admitting: Diagnostic Neuroimaging

## 2019-05-29 ENCOUNTER — Other Ambulatory Visit: Payer: Self-pay

## 2019-05-29 ENCOUNTER — Ambulatory Visit: Payer: Medicare Other | Admitting: Orthopaedic Surgery

## 2019-05-29 ENCOUNTER — Ambulatory Visit: Payer: Self-pay

## 2019-05-29 ENCOUNTER — Encounter: Payer: Self-pay | Admitting: Orthopaedic Surgery

## 2019-05-29 DIAGNOSIS — Z96641 Presence of right artificial hip joint: Secondary | ICD-10-CM

## 2019-05-29 DIAGNOSIS — M5441 Lumbago with sciatica, right side: Secondary | ICD-10-CM | POA: Diagnosis not present

## 2019-05-29 MED ORDER — METHOCARBAMOL 500 MG PO TABS: 500.0000 mg | ORAL_TABLET | Freq: Four times a day (QID) | ORAL | 1 refills | Status: DC | PRN

## 2019-05-29 MED ORDER — METHYLPREDNISOLONE 4 MG PO TABS: ORAL_TABLET | ORAL | 0 refills | Status: DC

## 2019-05-29 NOTE — Progress Notes (Signed)
Office Visit Note   Patient: [name] Good           Date of Birth: February 18, 1951           MRN: [id number] Visit Date: 05/29/2019              Requested by: Kaleen Mask, MD [address] [place],  [place] [address] PCP: Kaleen Mask, MD   Assessment & Plan: Visit Diagnoses:  1. History of right hip replacement   2. Right-sided low back pain with right-sided sciatica, unspecified chronicity     Plan: Ongoing have her stop the 5 mg of prednisone daily and actually try a steroid taper and Robaxin.  She will ambulate with her cane in the opposite side.  I would like to see her back in 2 weeks for repeat exam.  If she is still continuing to have pain around the hip area this may be from her spine but we would certainly look at other issues as it could relate to the hip replacement although on plain films are not seeing any issues on clinical exam her hip seems to be normal.  All questions and concerns were answered addressed.  We will see what she looks like in 2 weeks.  Follow-Up Instructions: Return in about 2 weeks (around 06/12/2019).   Orders:  Orders Placed This Encounter  Procedures  . XR HIP UNILAT W OR W/O PELVIS 1V RIGHT  . XR Lumbar Spine 2-3 Views   Meds ordered this encounter  Medications  . methylPREDNISolone (MEDROL) 4 MG tablet    Sig: Medrol dose pack. Take as instructed    Dispense:  21 tablet    Refill:  0  . methocarbamol (ROBAXIN) 500 MG tablet    Sig: Take 1 tablet (500 mg total) by mouth every 6 (six) hours as needed for muscle spasms.    Dispense:  60 tablet    Refill:  1      Procedures: No procedures performed   Clinical Data: No additional findings.   Subjective: Chief Complaint  Patient presents with  . Right Hip - Follow-up  The patient is someone who we are seeing today with right hip and right side pain has been going on for about 1 to 2 months.  We actually performed a right total hip arthroplasty on her just under  4 years ago.  She is also had significant lumbar spine surgery at multiple levels of the right side of her lumbar spine.  She ambulates with a cane.  She is morbidly obese.  She reports this pain along with the groin and hip area going into her back but denies any symptoms going down her leg.  She has been on 5 mg of prednisone daily.  She is not a diabetic.  The last few days this is started to ease off.  She denies any weakness in her legs.  HPI  Review of Systems She currently denies any headache, chest pain, shortness of breath, fever, chills, nausea, vomiting  Objective: Vital Signs: There were no vitals taken for this visit.  Physical Exam She is alert and orient x3 and in no acute distress Ortho Exam Examination of her right hip shows fluid range of motion of her hip with internal and external rotation.  There is no pain with rotation of her right hip.  There is no pain to palpation along the trochanteric area and no pain in the groin when I compress her hip on the  right side.  There is no rash in the area and no evidence of shingles.  Her incisions healed over nicely. Specialty Comments:  No specialty comments available.  Imaging: XR HIP UNILAT W OR W/O PELVIS 1V RIGHT  Result Date: 05/29/2019 An AP pelvis and lateral of the right hip shows a total hip arthroplasty with no complicating features or evidence of loosening.  XR Lumbar Spine 2-3 Views  Result Date: 05/29/2019 2 views of the lumbar spine show severe degenerative changes throughout the lumbar spine with degenerative scoliosis as well.    PMFS History: Patient Active Problem List   Diagnosis Date Noted  . Cellulitis of left upper extremity 10/18/2017  . Arthritis 10/18/2017  . Septic shock ([place]) 10/16/2017  . Former smoker 11/13/2016  . History of restless legs syndrome 11/13/2016  . Nontraumatic incomplete tear of right rotator cuff 08/18/2016  . Autoimmune disease ([place]) 08/12/2016  . Primary osteoarthritis of  both knees 08/12/2016  . Primary osteoarthritis of both feet 08/12/2016  . Obesity (BMI 35.0-39.9 without comorbidity) 08/12/2016  . Osteoarthritis of right hip 10/25/2015  . 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 S/P lumbar laminectomy 06/14/2015   Past Medical History:  Diagnosis Date  . Arthritis   . Depression   . GERD (gastroesophageal reflux disease)   . Myocardial infarction ([place]) 91   no visits to cardiac dr(thomas kelly) since 92  . Wound dehiscence    lumbar    History reviewed. No pertinent family history.  Past Surgical History:  Procedure Laterality Date  . APPENDECTOMY     17 yrs  . BREAST SURGERY Left    cyst  . CARDIAC CATHETERIZATION  91  . CHOLECYSTECTOMY     69 yrs old  . LUMBAR LAMINECTOMY/DECOMPRESSION MICRODISCECTOMY N/A 06/14/2015   Procedure: Lumbar Three-Four,Lumbar Four-Five, Lumbar Five-Sacral One Laminectomy;  Surgeon: Eustace Moore, MD;  Location: [facility];  Service: Neurosurgery;  Laterality: N/A;  . LUMBAR WOUND DEBRIDEMENT N/A 07/24/2015   Procedure: lumbar wound revision;  Surgeon: Eustace Moore, MD;  Location: [facility];  Service: Neurosurgery;  Laterality: N/A;  . TOTAL HIP ARTHROPLASTY Right 10/25/2015   Procedure: RIGHT TOTAL HIP ARTHROPLASTY ANTERIOR APPROACH;  Surgeon: Mcarthur Rossetti, MD;  Location: [facility];  Service: Orthopedics;  Laterality: Right;  . TUBAL LIGATION     Social History   Occupational History  . Not on file  Tobacco Use  . Smoking status: Former Smoker    Packs/day: 2.00    Years: 40.00    Pack years: 80.00    Types: Cigarettes    Quit date: 04/10/2013    Years since quitting: 6.1  . Smokeless tobacco: Never Used  Substance and Sexual Activity  . Alcohol use: Yes    Comment: rarely  . Drug use: No  . Sexual activity: Not on file

## 2019-06-12 ENCOUNTER — Ambulatory Visit: Payer: Medicare Other | Admitting: Orthopaedic Surgery

## 2019-09-06 ENCOUNTER — Other Ambulatory Visit: Payer: Self-pay | Admitting: Family Medicine

## 2019-09-06 DIAGNOSIS — W1830XA Fall on same level, unspecified, initial encounter: Secondary | ICD-10-CM

## 2019-09-06 DIAGNOSIS — T148XXA Other injury of unspecified body region, initial encounter: Secondary | ICD-10-CM

## 2019-09-06 DIAGNOSIS — H538 Other visual disturbances: Secondary | ICD-10-CM

## 2019-09-06 DIAGNOSIS — R42 Dizziness and giddiness: Secondary | ICD-10-CM

## 2019-09-06 DIAGNOSIS — R110 Nausea: Secondary | ICD-10-CM

## 2019-10-01 ENCOUNTER — Emergency Department (HOSPITAL_COMMUNITY): Payer: Medicare Other

## 2019-10-01 ENCOUNTER — Other Ambulatory Visit: Payer: Self-pay

## 2019-10-01 ENCOUNTER — Encounter (HOSPITAL_COMMUNITY): Payer: Self-pay

## 2019-10-01 ENCOUNTER — Inpatient Hospital Stay (HOSPITAL_COMMUNITY): Payer: Medicare Other

## 2019-10-01 ENCOUNTER — Inpatient Hospital Stay (HOSPITAL_COMMUNITY)
Admission: EM | Admit: 2019-10-01 | Discharge: 2019-10-05 | DRG: 065 | Disposition: A | Discharge status: Rehabilitation Facility | Payer: Medicare Other | Attending: Internal Medicine | Admitting: Internal Medicine

## 2019-10-01 DIAGNOSIS — M1990 Unspecified osteoarthritis, unspecified site: Secondary | ICD-10-CM | POA: Diagnosis present

## 2019-10-01 DIAGNOSIS — I252 Old myocardial infarction: Secondary | ICD-10-CM | POA: Diagnosis not present

## 2019-10-01 DIAGNOSIS — Z8673 Personal history of transient ischemic attack (TIA), and cerebral infarction without residual deficits: Secondary | ICD-10-CM

## 2019-10-01 DIAGNOSIS — R414 Neurologic neglect syndrome: Secondary | ICD-10-CM | POA: Diagnosis present

## 2019-10-01 DIAGNOSIS — Z880 Allergy status to penicillin: Secondary | ICD-10-CM | POA: Diagnosis not present

## 2019-10-01 DIAGNOSIS — I513 Intracardiac thrombosis, not elsewhere classified: Secondary | ICD-10-CM | POA: Diagnosis present

## 2019-10-01 DIAGNOSIS — N179 Acute kidney failure, unspecified: Secondary | ICD-10-CM | POA: Diagnosis present

## 2019-10-01 DIAGNOSIS — K219 Gastro-esophageal reflux disease without esophagitis: Secondary | ICD-10-CM | POA: Diagnosis present

## 2019-10-01 DIAGNOSIS — E860 Dehydration: Secondary | ICD-10-CM | POA: Diagnosis present

## 2019-10-01 DIAGNOSIS — Z87891 Personal history of nicotine dependence: Secondary | ICD-10-CM | POA: Diagnosis not present

## 2019-10-01 DIAGNOSIS — I6381 Other cerebral infarction due to occlusion or stenosis of small artery: Principal | ICD-10-CM | POA: Diagnosis present

## 2019-10-01 DIAGNOSIS — I10 Essential (primary) hypertension: Secondary | ICD-10-CM | POA: Diagnosis present

## 2019-10-01 DIAGNOSIS — Z981 Arthrodesis status: Secondary | ICD-10-CM

## 2019-10-01 DIAGNOSIS — Z7982 Long term (current) use of aspirin: Secondary | ICD-10-CM

## 2019-10-01 DIAGNOSIS — K5900 Constipation, unspecified: Secondary | ICD-10-CM | POA: Diagnosis present

## 2019-10-01 DIAGNOSIS — Z6841 Body Mass Index (BMI) 40.0 and over, adult: Secondary | ICD-10-CM | POA: Diagnosis not present

## 2019-10-01 DIAGNOSIS — Z96641 Presence of right artificial hip joint: Secondary | ICD-10-CM | POA: Diagnosis present

## 2019-10-01 DIAGNOSIS — F329 Major depressive disorder, single episode, unspecified: Secondary | ICD-10-CM | POA: Diagnosis present

## 2019-10-01 DIAGNOSIS — I639 Cerebral infarction, unspecified: Secondary | ICD-10-CM | POA: Diagnosis not present

## 2019-10-01 DIAGNOSIS — Z9049 Acquired absence of other specified parts of digestive tract: Secondary | ICD-10-CM | POA: Diagnosis not present

## 2019-10-01 DIAGNOSIS — I2510 Atherosclerotic heart disease of native coronary artery without angina pectoris: Secondary | ICD-10-CM | POA: Diagnosis present

## 2019-10-01 DIAGNOSIS — W19XXXA Unspecified fall, initial encounter: Secondary | ICD-10-CM | POA: Diagnosis present

## 2019-10-01 DIAGNOSIS — F4321 Adjustment disorder with depressed mood: Secondary | ICD-10-CM

## 2019-10-01 DIAGNOSIS — I63511 Cerebral infarction due to unspecified occlusion or stenosis of right middle cerebral artery: Secondary | ICD-10-CM

## 2019-10-01 DIAGNOSIS — R531 Weakness: Secondary | ICD-10-CM | POA: Diagnosis not present

## 2019-10-01 DIAGNOSIS — I6389 Other cerebral infarction: Secondary | ICD-10-CM | POA: Diagnosis not present

## 2019-10-01 DIAGNOSIS — F4320 Adjustment disorder, unspecified: Secondary | ICD-10-CM

## 2019-10-01 DIAGNOSIS — I739 Peripheral vascular disease, unspecified: Secondary | ICD-10-CM | POA: Diagnosis present

## 2019-10-01 DIAGNOSIS — E785 Hyperlipidemia, unspecified: Secondary | ICD-10-CM | POA: Diagnosis present

## 2019-10-01 DIAGNOSIS — G2581 Restless legs syndrome: Secondary | ICD-10-CM | POA: Diagnosis present

## 2019-10-01 DIAGNOSIS — R29708 NIHSS score 8: Secondary | ICD-10-CM | POA: Diagnosis present

## 2019-10-01 DIAGNOSIS — E876 Hypokalemia: Secondary | ICD-10-CM

## 2019-10-01 DIAGNOSIS — Z79899 Other long term (current) drug therapy: Secondary | ICD-10-CM

## 2019-10-01 DIAGNOSIS — G8194 Hemiplegia, unspecified affecting left nondominant side: Secondary | ICD-10-CM | POA: Diagnosis present

## 2019-10-01 DIAGNOSIS — R4781 Slurred speech: Secondary | ICD-10-CM | POA: Diagnosis present

## 2019-10-01 DIAGNOSIS — Z7952 Long term (current) use of systemic steroids: Secondary | ICD-10-CM

## 2019-10-01 DIAGNOSIS — G8929 Other chronic pain: Secondary | ICD-10-CM | POA: Diagnosis present

## 2019-10-01 DIAGNOSIS — M792 Neuralgia and neuritis, unspecified: Secondary | ICD-10-CM | POA: Diagnosis not present

## 2019-10-01 LAB — DIFFERENTIAL
Abs Immature Granulocytes: 0.02 10*3/uL (ref 0.00–0.07)
Basophils Absolute: 0.1 10*3/uL (ref 0.0–0.1)
Basophils Relative: 1 %
Eosinophils Absolute: 0.2 10*3/uL (ref 0.0–0.5)
Eosinophils Relative: 3 %
Immature Granulocytes: 0 %
Lymphocytes Relative: 21 %
Lymphs Abs: 1.5 10*3/uL (ref 0.7–4.0)
Monocytes Absolute: 0.7 10*3/uL (ref 0.1–1.0)
Monocytes Relative: 10 %
Neutro Abs: 4.8 10*3/uL (ref 1.7–7.7)
Neutrophils Relative %: 65 %

## 2019-10-01 LAB — COMPREHENSIVE METABOLIC PANEL
ALT: 23 U/L (ref 0–44)
AST: 35 U/L (ref 15–41)
Albumin: 3.5 g/dL (ref 3.5–5.0)
Alkaline Phosphatase: 88 U/L (ref 38–126)
Anion gap: 14 (ref 5–15)
BUN: 14 mg/dL (ref 8–23)
CO2: 22 mmol/L (ref 22–32)
Calcium: 8.9 mg/dL (ref 8.9–10.3)
Chloride: 102 mmol/L (ref 98–111)
Creatinine, Ser: 1.08 mg/dL — ABNORMAL HIGH (ref 0.44–1.00)
GFR calc Af Amer: >60 mL/min (ref >60)
GFR calc non Af Amer: 52 mL/min — ABNORMAL LOW (ref >60)
Glucose, Bld: 106 mg/dL — ABNORMAL HIGH (ref 70–99)
Potassium: 2.3 mmol/L — CL (ref 3.5–5.1)
Sodium: 138 mmol/L (ref 135–145)
Total Bilirubin: 1 mg/dL (ref 0.3–1.2)
Total Protein: 6.1 g/dL — ABNORMAL LOW (ref 6.5–8.1)

## 2019-10-01 LAB — I-STAT CHEM 8, ED
BUN: 15 mg/dL (ref 8–23)
Calcium, Ion: 1.19 mmol/L (ref 1.15–1.40)
Chloride: 102 mmol/L (ref 98–111)
Creatinine, Ser: 0.8 mg/dL (ref 0.44–1.00)
Glucose, Bld: 101 mg/dL — ABNORMAL HIGH (ref 70–99)
HCT: 40 % (ref 36.0–46.0)
Hemoglobin: 13.6 g/dL (ref 12.0–15.0)
Potassium: 2.3 mmol/L — CL (ref 3.5–5.1)
Sodium: 140 mmol/L (ref 135–145)
TCO2: 23 mmol/L (ref 22–32)

## 2019-10-01 LAB — PROTIME-INR
INR: 1.1 (ref 0.8–1.2)
Prothrombin Time: 13.8 seconds (ref 11.4–15.2)

## 2019-10-01 LAB — CBG MONITORING, ED: Glucose-Capillary: 97 mg/dL (ref 70–99)

## 2019-10-01 LAB — CBC
HCT: 40.7 % (ref 36.0–46.0)
Hemoglobin: 14 g/dL (ref 12.0–15.0)
MCH: 31.5 pg (ref 26.0–34.0)
MCHC: 34.4 g/dL (ref 30.0–36.0)
MCV: 91.5 fL (ref 80.0–100.0)
Platelets: 158 10*3/uL (ref 150–400)
RBC: 4.45 MIL/uL (ref 3.87–5.11)
RDW: 12.4 % (ref 11.5–15.5)
WBC: 7.3 10*3/uL (ref 4.0–10.5)
nRBC: 0 % (ref 0.0–0.2)

## 2019-10-01 LAB — APTT: aPTT: 23 seconds — ABNORMAL LOW (ref 24–36)

## 2019-10-01 IMAGING — CT CT HEAD CODE STROKE
3 series · 14 of 47 positions shown, 16 images · non-contrast
Comparison: None available.

CLINICAL DATA: Code stroke. Initial evaluation for acute ataxia,
left-sided weakness, altered mental status.

EXAM:
CT HEAD WITHOUT CONTRAST
TECHNIQUE: Contiguous axial images were obtained from the base of the skull
through the vertex without intravenous contrast.

[Series 3: head 5.0 st · axial · 0.43mm/px · z∈[+1172,+1326]mm · 8 of 37 slices shown, 10 images]
[im 3/37  brain]
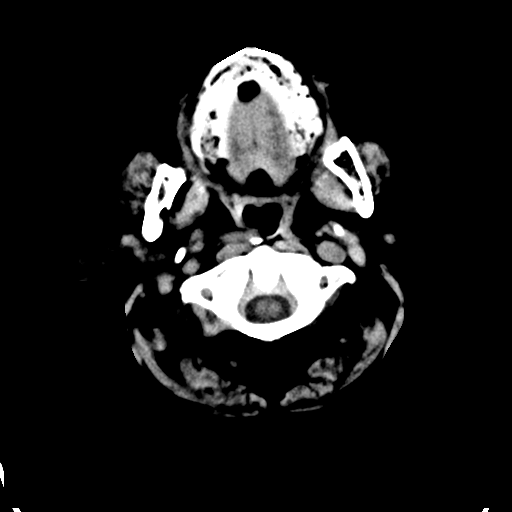
[im 3/37  bone]
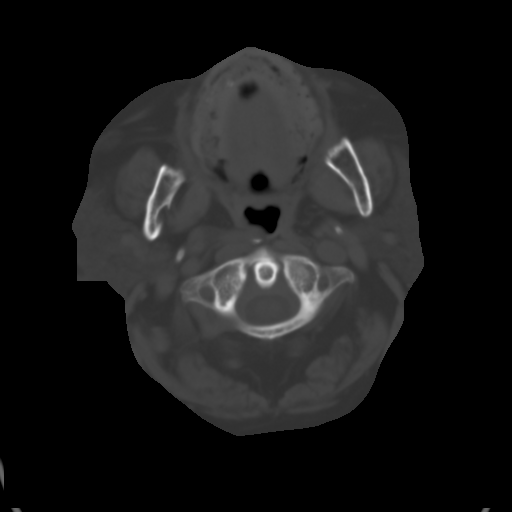
[im 8/37  brain]
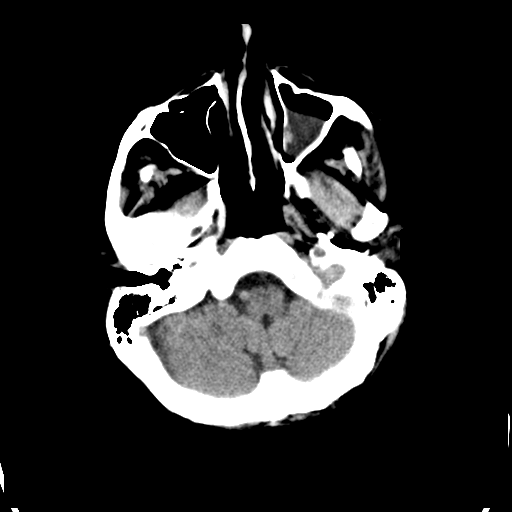
[im 12/37  brain]
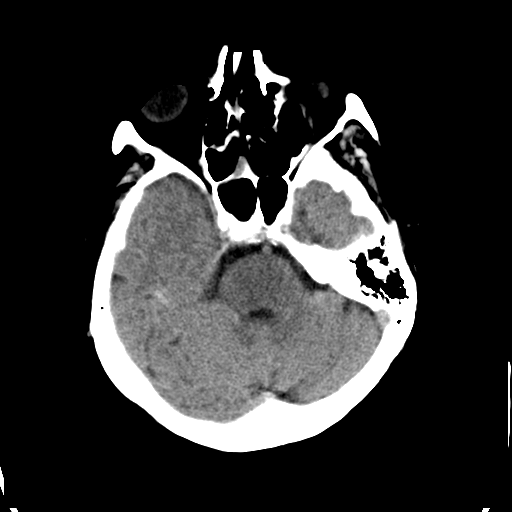
[im 17/37  brain]
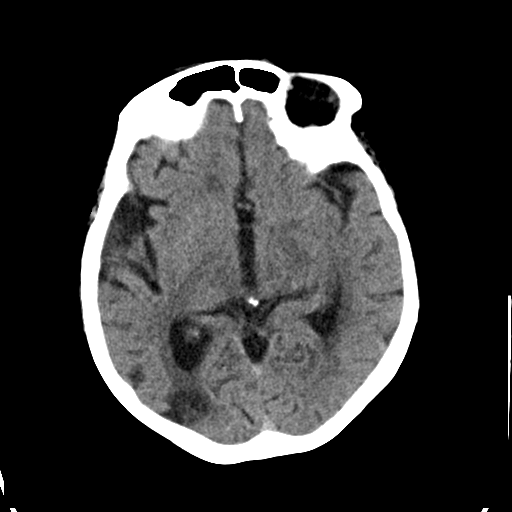
[im 20/37  brain]
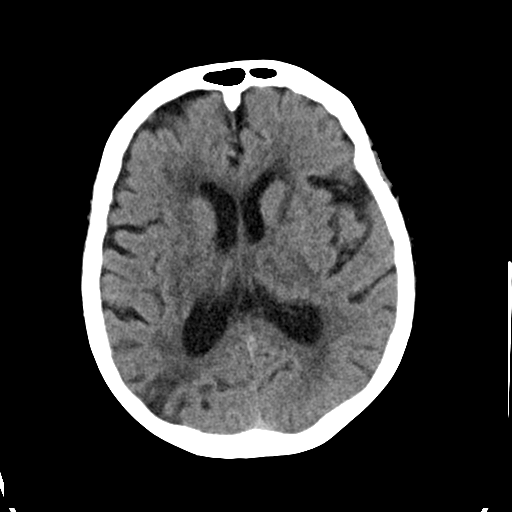
[im 20/37  bone]
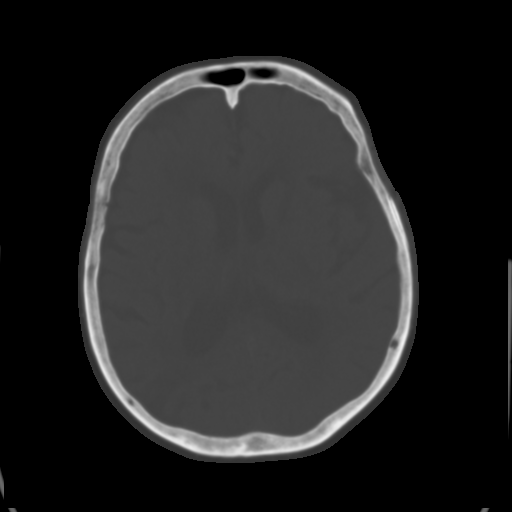
[im 25/37  brain]
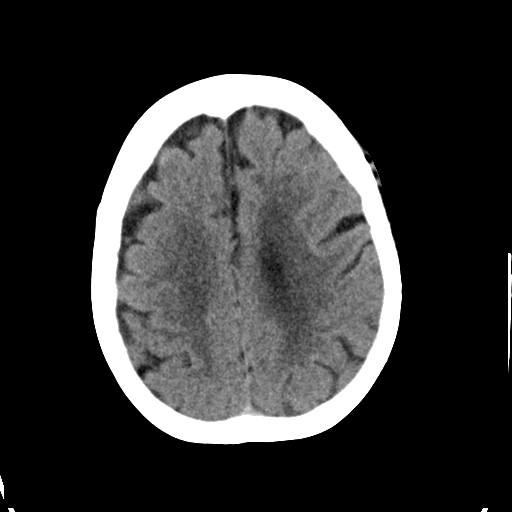
[im 29/37  brain]
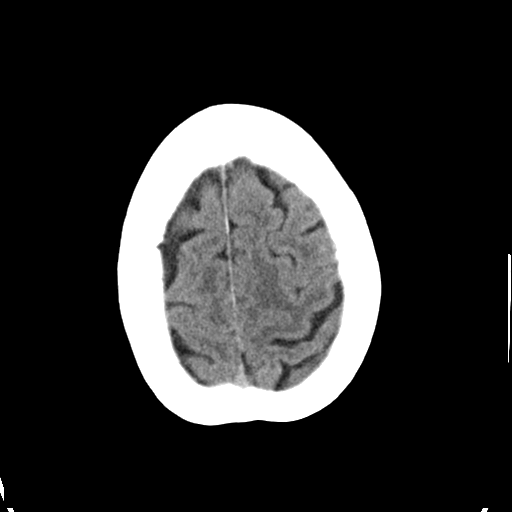
[im 34/37  brain]
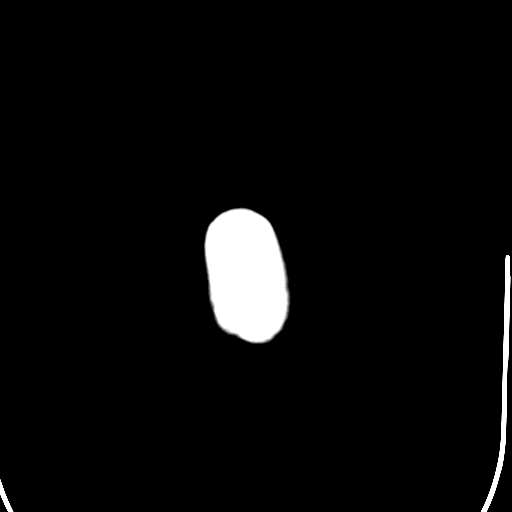

[Series 5: head 3.0 cor st · coronal · 0.36mm/px · 3 of 67 slices shown]
[im 23/67  brain]
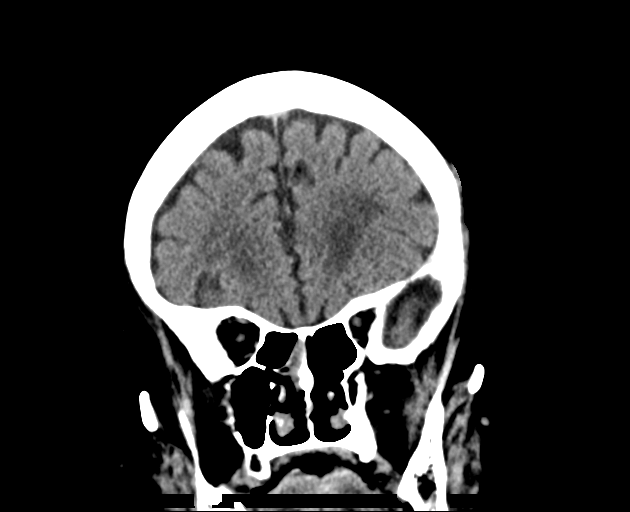
[im 30/67  brain]
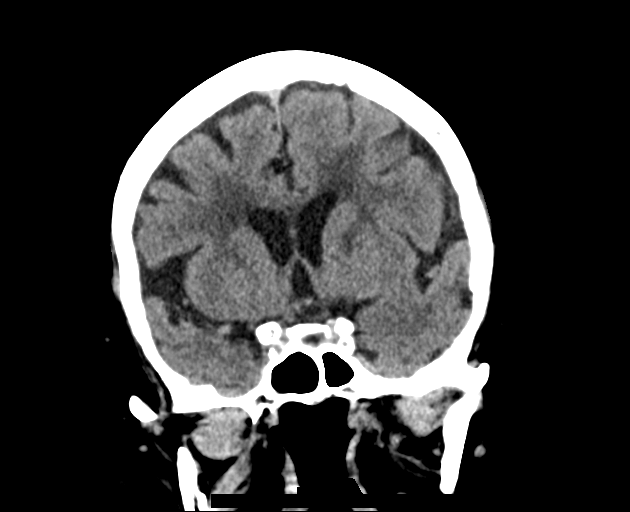
[im 37/67  brain]
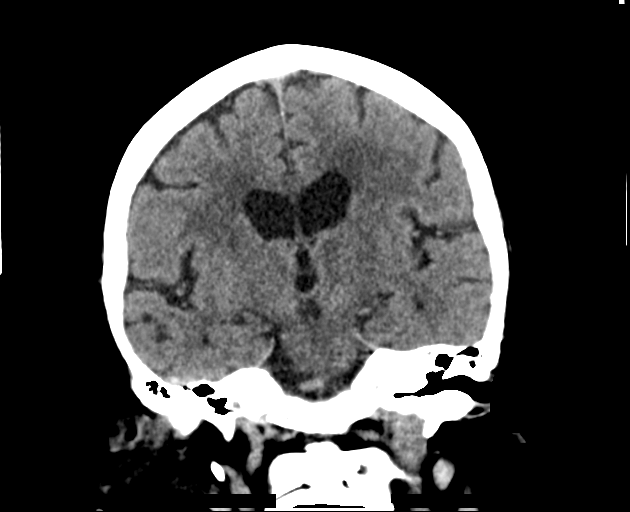

[Series 6: head 3.0 sag st · sagittal · 0.36mm/px · 3 of 67 slices shown]
[im 23/67  brain]
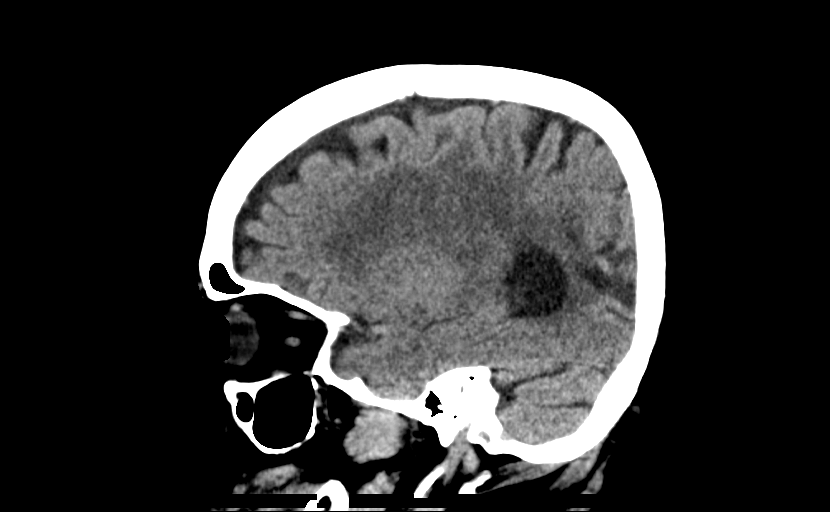
[im 34/67  brain]
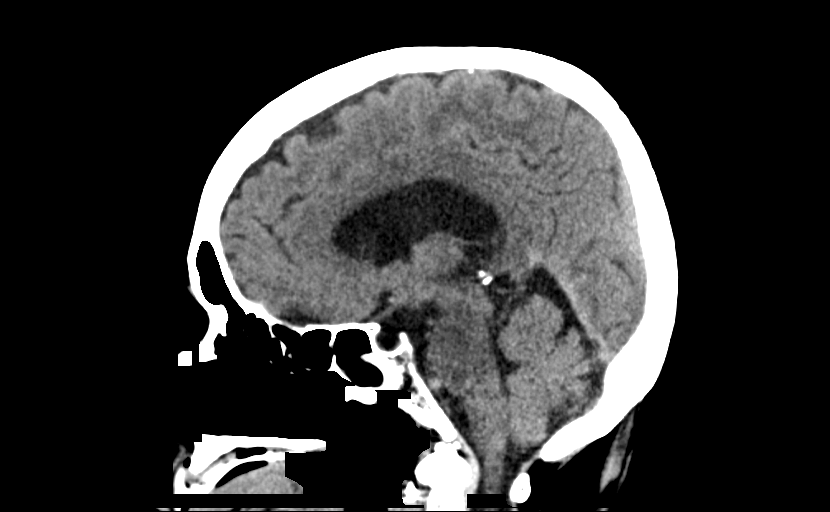
[im 45/67  brain]
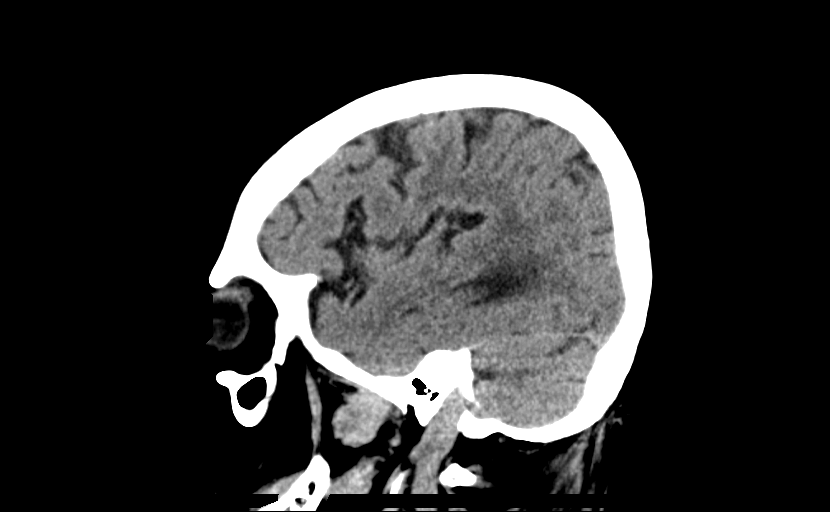

[14 of 47 positions shown; findings below may reference images not displayed]

FINDINGS: Brain: Generalized age-related cerebral atrophy. Patchy and
confluent hypodensity throughout the periventricular deep white
matter both cerebral hemispheres, most consistent with chronic small
vessel ischemic disease, advanced in nature. Chronic small
microvascular ischemic changes noted involving the deep gray nuclei
and pons as well. Focal encephalomalacia within the right
parieto-occipital region compatible with a chronic ischemic infarct.

No acute intracranial hemorrhage. No acute large vessel territory
infarct. No mass lesion, midline shift or mass effect. No
hydrocephalus or extra-axial fluid collection.

Vascular: No hyperdense vessel. Scattered vascular calcifications
noted within the carotid siphons.

Skull: Scalp soft tissues demonstrate no acute finding. Calvarium
intact.

Sinuses/Orbits: Globes and orbital soft tissues within normal
limits. Left maxillary sinus retention cyst noted. Scattered mucosal
thickening noted throughout the sphenoid ethmoidal sinuses, with
superimposed air-fluid level within the right sphenoid sinus. Trace
left mastoid effusion.

Other: None.

ASPECTS ([place]berta Stroke Program Early CT Score)

- Ganglionic level infarction (caudate, lentiform nuclei, internal
capsule, insula, M1-M3 cortex): 7

- Supraganglionic infarction (M4-M6 cortex): 3

Total score (0-10 with 10 being normal): 10
IMPRESSION: 1. No acute intracranial infarct or other abnormality.
2. ASPECTS is 10.
3. Chronic right parieto-occipital infarct.
4. Underlying age-related cerebral atrophy with advanced chronic
microvascular ischemic disease.

Critical Value/emergent results were called by telephone at the time
of interpretation on [DATE] at [DATE] to provider Dr. LMERYOUL, Who
verbally acknowledged these results.

## 2019-10-01 MED ORDER — SODIUM CHLORIDE 0.9 % IV SOLN: INTRAVENOUS | Status: AC

## 2019-10-01 MED ORDER — POTASSIUM CHLORIDE 10 MEQ/100ML IV SOLN
10.0000 meq | INTRAVENOUS | Status: AC
  Administered 2019-10-02 (×3): 10 meq via INTRAVENOUS
  Filled 2019-10-01 (×5): qty 100

## 2019-10-01 MED ORDER — ONDANSETRON HCL 4 MG/2ML IJ SOLN: 4.0000 mg | Freq: Once | INTRAMUSCULAR | Status: DC

## 2019-10-01 MED ORDER — POTASSIUM CHLORIDE CRYS ER 20 MEQ PO TBCR
40.0000 meq | EXTENDED_RELEASE_TABLET | Freq: Once | ORAL | Status: AC
  Administered 2019-10-02: 40 meq via ORAL
  Filled 2019-10-01: qty 2

## 2019-10-01 MED ORDER — ATORVASTATIN CALCIUM 80 MG PO TABS
80.0000 mg | ORAL_TABLET | Freq: Every day | ORAL | Status: DC
  Administered 2019-10-02 – 2019-10-05 (×4): 80 mg via ORAL
  Filled 2019-10-01 (×4): qty 1

## 2019-10-01 MED ORDER — ASPIRIN 325 MG PO TABS: 325.0000 mg | ORAL_TABLET | Freq: Every day | ORAL | Status: DC

## 2019-10-01 MED ORDER — IOHEXOL 350 MG/ML SOLN
100.0000 mL | Freq: Once | INTRAVENOUS | Status: AC | PRN
  Administered 2019-10-01: 100 mL via INTRAVENOUS

## 2019-10-01 MED ORDER — ACETAMINOPHEN 160 MG/5ML PO SOLN
650.0000 mg | ORAL | Status: DC | PRN
  Administered 2019-10-02: 650 mg
  Filled 2019-10-01: qty 20.3

## 2019-10-01 MED ORDER — ROPINIROLE HCL 1 MG PO TABS
3.0000 mg | ORAL_TABLET | Freq: Every evening | ORAL | Status: DC | PRN
  Administered 2019-10-02 – 2019-10-04 (×4): 3 mg via ORAL
  Filled 2019-10-01 (×4): qty 3

## 2019-10-01 MED ORDER — ONDANSETRON HCL 4 MG/2ML IJ SOLN
4.0000 mg | Freq: Once | INTRAMUSCULAR | Status: AC
  Administered 2019-10-02: 4 mg via INTRAVENOUS
  Filled 2019-10-01: qty 2

## 2019-10-01 MED ORDER — POTASSIUM CHLORIDE 10 MEQ/100ML IV SOLN
10.0000 meq | INTRAVENOUS | Status: DC
  Administered 2019-10-01 – 2019-10-02 (×2): 10 meq via INTRAVENOUS
  Filled 2019-10-01: qty 100

## 2019-10-01 MED ORDER — ACETAMINOPHEN 650 MG RE SUPP: 650.0000 mg | RECTAL | Status: DC | PRN

## 2019-10-01 MED ORDER — ASPIRIN 300 MG RE SUPP: 300.0000 mg | Freq: Every day | RECTAL | Status: DC

## 2019-10-01 MED ORDER — ACETAMINOPHEN 325 MG PO TABS
650.0000 mg | ORAL_TABLET | ORAL | Status: DC | PRN
  Administered 2019-10-03 – 2019-10-05 (×4): 650 mg via ORAL
  Filled 2019-10-01 (×5): qty 2

## 2019-10-01 MED ORDER — STROKE: EARLY STAGES OF RECOVERY BOOK
Freq: Once | Status: DC
  Filled 2019-10-01: qty 1

## 2019-10-01 MED ORDER — ENOXAPARIN SODIUM 40 MG/0.4ML ~~LOC~~ SOLN
40.0000 mg | SUBCUTANEOUS | Status: DC
  Administered 2019-10-03: 40 mg via SUBCUTANEOUS
  Filled 2019-10-01: qty 0.4

## 2019-10-01 MED ORDER — SODIUM CHLORIDE 0.9% FLUSH
3.0000 mL | Freq: Once | INTRAVENOUS | Status: AC
Start: 2019-10-01 — End: 2019-10-01
  Administered 2019-10-01: 3 mL via INTRAVENOUS

## 2019-10-01 NOTE — H&P (Addendum)
History and Physical    TISHANNA DUNFORD JJO:[id number] DOB: 17-Apr-1951 DOA: 10/01/2019  PCP: Kaleen Mask, MD Patient coming from: Home  Chief Complaint: Left-sided weakness  HPI: CATILYN BOGGUS is a 69 y.o. female with medical history significant of CAD, depression, arthritis, GERD, RLS presenting to the ED as a code stroke for acute onset left-sided weakness.  Patient was last seen normal at around 230 or 3 PM today by family.  They went to check on her around 5 PM after she called them and said she had a fall.  They noticed that she has slurring of speech and had difficulty getting up due to left-sided weakness.  EMS was called.  EMS noted left arm weakness and drift and slurred speech.  Code stroke was activated.  At the time of arrival to the ED, patient was seen by neurology and felt to be outside the window for IV TPA.Marland Kitchen  Head CT negative for acute infarct.  CTA head and neck negative for LVO.  CT cerebral perfusion study negative.  CT C-spine negative for acute traumatic injury. Labs notable for hypokalemia with potassium 2.3.  EKG without acute changes.  Patient states around 5 PM today she felt lightheaded and her left arm felt weak.  While walking she fell.  Denies loss of consciousness.  Denies preceding palpitations, shortness of breath, or chest pain.  Denies prior history of stroke.  States she has not had anything to eat all day and is feeling nauseous.  Denies abdominal pain, nausea, vomiting, or diarrhea at home.  Denies fevers or cough.  Denies dysuria.  She is requesting her home Requip for restless leg syndrome.  Patient was very upset as her mother recently passed away and her funeral is tomorrow.  States she has not been eating much at home.  Review of Systems:  All systems reviewed and apart from history of presenting illness, are negative.  Past Medical History:  Diagnosis Date  . Arthritis   . Depression   . GERD (gastroesophageal reflux disease)   . Myocardial  infarction (HCC) 89   no visits to cardiac dr(thomas kelly) since 89  . Wound dehiscence    lumbar    Past Surgical History:  Procedure Laterality Date  . APPENDECTOMY     17 yrs  . BREAST SURGERY Left    cyst  . CARDIAC CATHETERIZATION  89  . CHOLECYSTECTOMY     69 yrs old  . LUMBAR LAMINECTOMY/DECOMPRESSION MICRODISCECTOMY N/A 06/14/2015   Procedure: Lumbar Three-Four,Lumbar Four-Five, Lumbar Five-Sacral One Laminectomy;  Surgeon: Tia Alert, MD;  Location: [facility];  Service: Neurosurgery;  Laterality: N/A;  . LUMBAR WOUND DEBRIDEMENT N/A 07/24/2015   Procedure: lumbar wound revision;  Surgeon: Tia Alert, MD;  Location: [facility];  Service: Neurosurgery;  Laterality: N/A;  . TOTAL HIP ARTHROPLASTY Right 10/25/2015   Procedure: RIGHT TOTAL HIP ARTHROPLASTY ANTERIOR APPROACH;  Surgeon: Kathryne Hitch, MD;  Location: [facility];  Service: Orthopedics;  Laterality: Right;  . TUBAL LIGATION       reports that she quit smoking about 6 years ago. Her smoking use included cigarettes. She has a 80.00 pack-year smoking history. She has never used smokeless tobacco. She reports current alcohol use. She reports that she does not use drugs.  Allergies  Allergen Reactions  . Amoxicillin Other (See Comments)    Tolerated Zosyn Has patient had a PCN reaction causing immediate rash, facial/tongue/throat swelling, SOB or lightheadedness with hypotension: No Has  patient had a PCN reaction causing severe rash involving mucus membranes or skin necrosis: No Has patient had a PCN reaction that required hospitalization No Has patient had a PCN reaction occurring within the last 10 years: No If all of the above answers are "NO", then may proceed with Cephalosporin use.    History reviewed. No pertinent family history.  Prior to Admission medications   Medication Sig Start Date End Date Taking? Authorizing Provider  aspirin 325 MG EC tablet Take 1 tablet (325 mg total) by mouth  daily. 10/19/17   Burnadette Pop, MD  calcium carbonate (TUMS EX) 750 MG chewable tablet Chew 1 tablet by mouth 2 (two) times daily as needed for heartburn.    [provider]  doxycycline (VIBRAMYCIN) 100 MG capsule Take 1 capsule (100 mg total) by mouth 2 (two) times daily. 10/20/17   Burnadette Pop, MD  methocarbamol (ROBAXIN) 500 MG tablet Take 1 tablet (500 mg total) by mouth every 6 (six) hours as needed for muscle spasms. 05/29/19   Kathryne Hitch, MD  methylPREDNISolone (MEDROL) 4 MG tablet Medrol dose pack. Take as instructed 05/29/19   Kathryne Hitch, MD  omeprazole (PRILOSEC) 20 MG capsule Take 20 mg by mouth daily as needed (for acid reflux).     [provider]  phentermine (ADIPEX-P) 37.5 MG tablet Take 37.5 mg by mouth daily as needed (appetite).     [provider]  predniSONE (DELTASONE) 5 MG tablet Take 1 tablet (5 mg total) by mouth daily with breakfast. Patient taking differently: Take 10 mg by mouth daily with breakfast.  08/18/16   Pollyann Savoy, MD  rOPINIRole (REQUIP) 3 MG tablet Take 3 mg by mouth at bedtime as needed (restless leg).  07/10/16   [provider]  sertraline (ZOLOFT) 100 MG tablet Take 100 mg by mouth daily.    [provider]    Physical Exam: Vitals:   10/01/19 2215 10/01/19 2230 10/01/19 2245 10/01/19 2315  BP: (!) 130/117 (!) 165/88 (!) 150/87 (!) 164/79  Pulse: (!) 101 (!) 101 (!) 102 (!) 103  Resp: (!) 24  Temp:      TempSrc:      SpO2: 100% 93% 96% 97%    Physical Exam  Constitutional: She is oriented to person, place, and time. She appears well-developed and well-nourished. No distress.  HENT:  Head: Normocephalic.  Very dry mucous membranes  Eyes: Right eye exhibits no discharge. Left eye exhibits no discharge.  Cardiovascular: Normal rate, regular rhythm and intact distal pulses.  Pulmonary/Chest: Effort normal and breath sounds normal. No respiratory distress. She  has no wheezes. She has no rales.  Abdominal: Soft. Bowel sounds are normal. She exhibits no distension. There is no abdominal tenderness. There is no guarding.  Musculoskeletal:        General: No edema.     Cervical back: Neck supple.  Neurological: She is alert and oriented to person, place, and time. No cranial nerve deficit.  Speech fluent, tongue midline, no facial droop Strength 3 out of 5 in the left upper extremity and 5 out of 5 in the right upper extremity. Strength 5 out of 5 in bilateral lower extremities. Sensation light touch intact throughout.  Skin: Skin is warm and dry. She is not diaphoretic.    Labs on Admission: I have personally reviewed following labs and imaging studies  CBC: Recent Labs  Lab 10/01/19 2056 10/01/19 2105  WBC 7.3  --   NEUTROABS  4.8  --   HGB 14.0 13.6  HCT 40.7 40.0  MCV 91.5  --   PLT 158  --    Basic Metabolic Panel: Recent Labs  Lab 10/01/19 2056 10/01/19 2105  NA 138 140  K 2.3* 2.3*  CL 102 102  CO2 22  --   GLUCOSE 106* 101*  BUN 14 15  CREATININE 1.08* 0.80  CALCIUM 8.9  --    GFR: CrCl cannot be calculated (Unknown ideal weight.). Liver Function Tests: Recent Labs  Lab 10/01/19 2056  AST 35  ALT 23  ALKPHOS 88  BILITOT 1.0  PROT 6.1*  ALBUMIN 3.5   No results for input(s): LIPASE, AMYLASE in the last 168 hours. No results for input(s): AMMONIA in the last 168 hours. Coagulation Profile: Recent Labs  Lab 10/01/19 2056  INR 1.1   Cardiac Enzymes: No results for input(s): CKTOTAL, CKMB, CKMBINDEX, TROPONINI in the last 168 hours. BNP (last 3 results) No results for input(s): PROBNP in the last 8760 hours. HbA1C: No results for input(s): HGBA1C in the last 72 hours. CBG: Recent Labs  Lab 10/01/19 2256  GLUCAP 97   Lipid Profile: No results for input(s): CHOL, HDL, LDLCALC, TRIG, CHOLHDL, LDLDIRECT in the last 72 hours. Thyroid Function Tests: No results for input(s): TSH, T4TOTAL, FREET4, T3FREE,  THYROIDAB in the last 72 hours. Anemia Panel: No results for input(s): VITAMINB12, FOLATE, FERRITIN, TIBC, IRON, RETICCTPCT in the last 72 hours. Urine analysis:    Component Value Date/Time   COLORURINE AMBER (A) 10/16/2017 0608   APPEARANCEUR CLOUDY (A) 10/16/2017 0608   LABSPEC 1.019 10/16/2017 0608   PHURINE 5.0 10/16/2017 0608   GLUCOSEU NEGATIVE 10/16/2017 0608   HGBUR SMALL (A) 10/16/2017 0608   BILIRUBINUR SMALL (A) 10/16/2017 0608   KETONESUR NEGATIVE 10/16/2017 0608   PROTEINUR >=300 (A) 10/16/2017 0608   NITRITE NEGATIVE 10/16/2017 0608   LEUKOCYTESUR MODERATE (A) 10/16/2017 0608    Radiological Exams on Admission: CT Code Stroke CTA Head W/WO contrast  Result Date: 10/01/2019 CLINICAL DATA:  Initial evaluation for acute stroke, left-sided weakness, altered mental status. EXAM: CT ANGIOGRAPHY HEAD AND NECK CT PERFUSION BRAIN TECHNIQUE: Multidetector CT imaging of the head and neck was performed using the standard protocol during bolus administration of intravenous contrast. Multiplanar CT image reconstructions and MIPs were obtained to evaluate the vascular anatomy. Carotid stenosis measurements (when applicable) are obtained utilizing NASCET criteria, using the distal internal carotid diameter as the denominator. Multiphase CT imaging of the brain was performed following IV bolus contrast injection. Subsequent parametric perfusion maps were calculated using RAPID software. CONTRAST:  OMNIPAQUE IOHEXOL 350 MG/ML SOLN COMPARISON:  Comparison made with prior noncontrast head CT from earlier same day. FINDINGS: CTA NECK FINDINGS Aortic arch: Visualized aortic arch of normal caliber with normal branch pattern. Moderate atheromatous plaque seen about the arch and origin of the great vessels without hemodynamically significant stenosis. Note made of soft plaque protruding into the lumen of the distal arch/proximal intrathoracic aorta (series 5, image 307). Visualized subclavian  arteries widely patent. Right carotid system: Right common carotid artery patent from its origin to the bifurcation without flow-limiting stenosis. Scattered eccentric plaque about the right bifurcation without significant stenosis. Just distally within the proximal right ICA, additional focal plaque is seen with associated short-segment stenosis of up to 50% by NASCET criteria (series 5, image 169). Right ICA tortuous and medialized into the retropharyngeal space. Right ICA otherwise widely patent distally to the skull base without stenosis, dissection  or occlusion. Left carotid system: Left common carotid artery patent from its origin to the bifurcation without stenosis. Scattered eccentric mixed plaque about the left bifurcation/proximal left ICA without hemodynamically significant stenosis. Proximal left ICA tortuous and medialized into the retropharyngeal space. Left ICA widely patent distally to the skull base without stenosis, dissection or occlusion. Vertebral arteries: Both vertebral arteries arise from the subclavian arteries. Strongly dominant right vertebral artery with a markedly hypoplastic left vertebral artery noted. Dominant right vertebral artery patent within the neck without stenosis, dissection, or occlusion. Markedly diminutive left vertebral artery patent as well without appreciable stenosis or other vascular abnormality. Skeleton: Alignment: Straightening of the normal cervical lordosis. No listhesis or malalignment. Skull base and vertebrae: Skull base intact. Normal C1-2 articulations are preserved in the dens is intact. Vertebral body heights maintained. No acute fracture. Soft tissues and spinal canal: Soft tissues of the neck demonstrate no acute finding. No abnormal prevertebral edema. Spinal canal within normal limits. Disc levels: Mild multilevel cervical spondylosis without significant stenosis, most pronounced at C6-7. Upper chest: Visualized upper chest demonstrates no acute  finding. Partially visualized lung apices are clear. Other: None. Other neck: No other acute soft tissue abnormality within the neck. No mass lesion or adenopathy. Subcentimeter calcification noted within the left lobe of thyroid. Upper chest: Visualized upper chest demonstrates no acute finding. Partially visualized lungs are grossly clear. Review of the MIP images confirms the above findings CTA HEAD FINDINGS Anterior circulation: Petrous segments widely patent bilaterally. Scattered atheromatous plaque throughout the carotid siphons with relatively mild multifocal stenosis. A1 segments patent bilaterally. Normal anterior communicating artery complex. Anterior cerebral arteries widely patent proximally. Short-segment severe distal right ACA stenosis noted (series 5, image 62). ACAs otherwise widely patent to their distal aspects. No M1 stenosis or occlusion. Normal MCA bifurcations. Distal MCA branches well perfused and symmetric. Posterior circulation: Dominant right vertebral artery patent to the vertebrobasilar junction without stenosis. Right PICA not visualized. Hypoplastic left vertebral artery patent to the vertebrobasilar junction as well. Left PICA patent. Basilar patent to its distal aspect without stenosis. Superior cerebral arteries patent bilaterally. Left PCA supplied via the basilar. Right PCA supplied via a hypoplastic right P1 segment and robust right posterior communicating artery. Both PCAs well perfused to their distal aspects without stenosis. Venous sinuses: Patent. Anatomic variants: Predominant fetal type origin of the right PCA. No intracranial aneurysm. Review of the MIP images confirms the above findings CT Brain Perfusion Findings: ASPECTS: 10 CBF (<30%) Volume: 68mL Perfusion (Tmax>6.0s) volume: 50mL Mismatch Volume: 34mL Infarction Location:Negative CT perfusion for acute core infarct or other perfusion deficit. IMPRESSION: CTA HEAD AND NECK IMPRESSION: 1. Negative CTA for emergent large  vessel occlusion. 2. 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 3. Single short-segment severe distal right ACA stenosis as above. 4. Additional moderate atheromatous disease about the major arterial vasculature of the head and neck as above. No other hemodynamically significant or correctable stenosis. CT PERFUSION IMPRESSION: Negative CT perfusion. No evidence for acute core infarct or other perfusion deficit. CT CERVICAL SPINE IMPRESSION: No acute traumatic injury within the cervical spine. These results were communicated to Dr. Wilford Corner at approximately 9:00 pmon 6/6/2021by text page via the [facility] messaging system. Electronically Signed   By: Rise Mu M.D.   On: 10/01/2019 21:50   CT Code Stroke CTA Neck W/WO contrast  Result Date: 10/01/2019 CLINICAL DATA:  Initial evaluation for acute stroke, left-sided weakness, altered mental  status. EXAM: CT ANGIOGRAPHY HEAD AND NECK CT PERFUSION BRAIN TECHNIQUE: Multidetector CT imaging of the head and neck was performed using the standard protocol during bolus administration of intravenous contrast. Multiplanar CT image reconstructions and MIPs were obtained to evaluate the vascular anatomy. Carotid stenosis measurements (when applicable) are obtained utilizing NASCET criteria, using the distal internal carotid diameter as the denominator. Multiphase CT imaging of the brain was performed following IV bolus contrast injection. Subsequent parametric perfusion maps were calculated using RAPID software. CONTRAST:  146mL OMNIPAQUE IOHEXOL 350 MG/ML SOLN COMPARISON:  Comparison made with prior noncontrast head CT from earlier same day. FINDINGS: CTA NECK FINDINGS Aortic arch: Visualized aortic arch of normal caliber with normal branch pattern. Moderate atheromatous plaque seen about the arch and origin of the great vessels without hemodynamically significant stenosis. Note made of soft  plaque protruding into the lumen of the distal arch/proximal intrathoracic aorta (series 5, image 307). Visualized subclavian arteries widely patent. Right carotid system: Right common carotid artery patent from its origin to the bifurcation without flow-limiting stenosis. Scattered eccentric plaque about the right bifurcation without significant stenosis. Just distally within the proximal right ICA, additional focal plaque is seen with associated short-segment stenosis of up to 50% by NASCET criteria (series 5, image 169). Right ICA tortuous and medialized into the retropharyngeal space. Right ICA otherwise widely patent distally to the skull base without stenosis, dissection or occlusion. Left carotid system: Left common carotid artery patent from its origin to the bifurcation without stenosis. Scattered eccentric mixed plaque about the left bifurcation/proximal left ICA without hemodynamically significant stenosis. Proximal left ICA tortuous and medialized into the retropharyngeal space. Left ICA widely patent distally to the skull base without stenosis, dissection or occlusion. Vertebral arteries: Both vertebral arteries arise from the subclavian arteries. Strongly dominant right vertebral artery with a markedly hypoplastic left vertebral artery noted. Dominant right vertebral artery patent within the neck without stenosis, dissection, or occlusion. Markedly diminutive left vertebral artery patent as well without appreciable stenosis or other vascular abnormality. Skeleton: Alignment: Straightening of the normal cervical lordosis. No listhesis or malalignment. Skull base and vertebrae: Skull base intact. Normal C1-2 articulations are preserved in the dens is intact. Vertebral body heights maintained. No acute fracture. Soft tissues and spinal canal: Soft tissues of the neck demonstrate no acute finding. No abnormal prevertebral edema. Spinal canal within normal limits. Disc levels: Mild multilevel cervical  spondylosis without significant stenosis, most pronounced at C6-7. Upper chest: Visualized upper chest demonstrates no acute finding. Partially visualized lung apices are clear. Other: None. Other neck: No other acute soft tissue abnormality within the neck. No mass lesion or adenopathy. Subcentimeter calcification noted within the left lobe of thyroid. Upper chest: Visualized upper chest demonstrates no acute finding. Partially visualized lungs are grossly clear. Review of the MIP images confirms the above findings CTA HEAD FINDINGS Anterior circulation: Petrous segments widely patent bilaterally. Scattered atheromatous plaque throughout the carotid siphons with relatively mild multifocal stenosis. A1 segments patent bilaterally. Normal anterior communicating artery complex. Anterior cerebral arteries widely patent proximally. Short-segment severe distal right ACA stenosis noted (series 5, image 62). ACAs otherwise widely patent to their distal aspects. No M1 stenosis or occlusion. Normal MCA bifurcations. Distal MCA branches well perfused and symmetric. Posterior circulation: Dominant right vertebral artery patent to the vertebrobasilar junction without stenosis. Right PICA not visualized. Hypoplastic left vertebral artery patent to the vertebrobasilar junction as well. Left PICA patent. Basilar patent to its distal aspect without stenosis. Superior cerebral arteries patent  bilaterally. Left PCA supplied via the basilar. Right PCA supplied via a hypoplastic right P1 segment and robust right posterior communicating artery. Both PCAs well perfused to their distal aspects without stenosis. Venous sinuses: Patent. Anatomic variants: Predominant fetal type origin of the right PCA. No intracranial aneurysm. Review of the MIP images confirms the above findings CT Brain Perfusion Findings: ASPECTS: 10 CBF (<30%) Volume: 0mL Perfusion (Tmax>6.0s) volume: 0mL Mismatch Volume: 0mL Infarction Location:Negative CT perfusion  for acute core infarct or other perfusion deficit. IMPRESSION: CTA HEAD AND NECK IMPRESSION: 1. Negative CTA for emergent large vessel occlusion. 2. 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 3. Single short-segment severe distal right ACA stenosis as above. 4. Additional moderate atheromatous disease about the major arterial vasculature of the head and neck as above. No other hemodynamically significant or correctable stenosis. CT PERFUSION IMPRESSION: Negative CT perfusion. No evidence for acute core infarct or other perfusion deficit. CT CERVICAL SPINE IMPRESSION: No acute traumatic injury within the cervical spine. These results were communicated to Dr. Wilford Corner at approximately 9:00 pmon 6/6/2021by text page via the [facility] messaging system. Electronically Signed   By: Rise Mu M.D.   On: 10/01/2019 21:50   CT C-SPINE NO CHARGE  Result Date: 10/01/2019 CLINICAL DATA:  Initial evaluation for acute stroke, left-sided weakness, altered mental status. EXAM: CT ANGIOGRAPHY HEAD AND NECK CT PERFUSION BRAIN TECHNIQUE: Multidetector CT imaging of the head and neck was performed using the standard protocol during bolus administration of intravenous contrast. Multiplanar CT image reconstructions and MIPs were obtained to evaluate the vascular anatomy. Carotid stenosis measurements (when applicable) are obtained utilizing NASCET criteria, using the distal internal carotid diameter as the denominator. Multiphase CT imaging of the brain was performed following IV bolus contrast injection. Subsequent parametric perfusion maps were calculated using RAPID software. CONTRAST:  OMNIPAQUE IOHEXOL 350 MG/ML SOLN COMPARISON:  Comparison made with prior noncontrast head CT from earlier same day. FINDINGS: CTA NECK FINDINGS Aortic arch: Visualized aortic arch of normal caliber with normal branch pattern. Moderate atheromatous plaque seen  about the arch and origin of the great vessels without hemodynamically significant stenosis. Note made of soft plaque protruding into the lumen of the distal arch/proximal intrathoracic aorta (series 5, image 307). Visualized subclavian arteries widely patent. Right carotid system: Right common carotid artery patent from its origin to the bifurcation without flow-limiting stenosis. Scattered eccentric plaque about the right bifurcation without significant stenosis. Just distally within the proximal right ICA, additional focal plaque is seen with associated short-segment stenosis of up to 50% by NASCET criteria (series 5, image 169). Right ICA tortuous and medialized into the retropharyngeal space. Right ICA otherwise widely patent distally to the skull base without stenosis, dissection or occlusion. Left carotid system: Left common carotid artery patent from its origin to the bifurcation without stenosis. Scattered eccentric mixed plaque about the left bifurcation/proximal left ICA without hemodynamically significant stenosis. Proximal left ICA tortuous and medialized into the retropharyngeal space. Left ICA widely patent distally to the skull base without stenosis, dissection or occlusion. Vertebral arteries: Both vertebral arteries arise from the subclavian arteries. Strongly dominant right vertebral artery with a markedly hypoplastic left vertebral artery noted. Dominant right vertebral artery patent within the neck without stenosis, dissection, or occlusion. Markedly diminutive left vertebral artery patent as well without appreciable stenosis or other vascular abnormality. Skeleton: Alignment: Straightening of the normal cervical lordosis. No listhesis or malalignment. Skull base and vertebrae:  Skull base intact. Normal C1-2 articulations are preserved in the dens is intact. Vertebral body heights maintained. No acute fracture. Soft tissues and spinal canal: Soft tissues of the neck demonstrate no acute finding.  No abnormal prevertebral edema. Spinal canal within normal limits. Disc levels: Mild multilevel cervical spondylosis without significant stenosis, most pronounced at C6-7. Upper chest: Visualized upper chest demonstrates no acute finding. Partially visualized lung apices are clear. Other: None. Other neck: No other acute soft tissue abnormality within the neck. No mass lesion or adenopathy. Subcentimeter calcification noted within the left lobe of thyroid. Upper chest: Visualized upper chest demonstrates no acute finding. Partially visualized lungs are grossly clear. Review of the MIP images confirms the above findings CTA HEAD FINDINGS Anterior circulation: Petrous segments widely patent bilaterally. Scattered atheromatous plaque throughout the carotid siphons with relatively mild multifocal stenosis. A1 segments patent bilaterally. Normal anterior communicating artery complex. Anterior cerebral arteries widely patent proximally. Short-segment severe distal right ACA stenosis noted (series 5, image 62). ACAs otherwise widely patent to their distal aspects. No M1 stenosis or occlusion. Normal MCA bifurcations. Distal MCA branches well perfused and symmetric. Posterior circulation: Dominant right vertebral artery patent to the vertebrobasilar junction without stenosis. Right PICA not visualized. Hypoplastic left vertebral artery patent to the vertebrobasilar junction as well. Left PICA patent. Basilar patent to its distal aspect without stenosis. Superior cerebral arteries patent bilaterally. Left PCA supplied via the basilar. Right PCA supplied via a hypoplastic right P1 segment and robust right posterior communicating artery. Both PCAs well perfused to their distal aspects without stenosis. Venous sinuses: Patent. Anatomic variants: Predominant fetal type origin of the right PCA. No intracranial aneurysm. Review of the MIP images confirms the above findings CT Brain Perfusion Findings: ASPECTS: 10 CBF (<30%)  Volume: 0mL Perfusion (Tmax>6.0s) volume: 0mL Mismatch Volume: 0mL Infarction Location:Negative CT perfusion for acute core infarct or other perfusion deficit. IMPRESSION: CTA HEAD AND NECK IMPRESSION: 1. Negative CTA for emergent large vessel occlusion. 2. 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 3. Single short-segment severe distal right ACA stenosis as above. 4. Additional moderate atheromatous disease about the major arterial vasculature of the head and neck as above. No other hemodynamically significant or correctable stenosis. CT PERFUSION IMPRESSION: Negative CT perfusion. No evidence for acute core infarct or other perfusion deficit. CT CERVICAL SPINE IMPRESSION: No acute traumatic injury within the cervical spine. These results were communicated to Dr. Wilford Corner at approximately 9:00 pmon 6/6/2021by text page via the [facility] messaging system. Electronically Signed   By: Rise Mu M.D.   On: 10/01/2019 21:50   CT Code Stroke Cerebral Perfusion with contrast  Result Date: 10/01/2019 CLINICAL DATA:  Initial evaluation for acute stroke, left-sided weakness, altered mental status. EXAM: CT ANGIOGRAPHY HEAD AND NECK CT PERFUSION BRAIN TECHNIQUE: Multidetector CT imaging of the head and neck was performed using the standard protocol during bolus administration of intravenous contrast. Multiplanar CT image reconstructions and MIPs were obtained to evaluate the vascular anatomy. Carotid stenosis measurements (when applicable) are obtained utilizing NASCET criteria, using the distal internal carotid diameter as the denominator. Multiphase CT imaging of the brain was performed following IV bolus contrast injection. Subsequent parametric perfusion maps were calculated using RAPID software. CONTRAST:  OMNIPAQUE IOHEXOL 350 MG/ML SOLN COMPARISON:  Comparison made with prior noncontrast head CT from earlier same day. FINDINGS:  CTA NECK FINDINGS Aortic arch: Visualized aortic arch of normal caliber with normal branch pattern.  Moderate atheromatous plaque seen about the arch and origin of the great vessels without hemodynamically significant stenosis. Note made of soft plaque protruding into the lumen of the distal arch/proximal intrathoracic aorta (series 5, image 307). Visualized subclavian arteries widely patent. Right carotid system: Right common carotid artery patent from its origin to the bifurcation without flow-limiting stenosis. Scattered eccentric plaque about the right bifurcation without significant stenosis. Just distally within the proximal right ICA, additional focal plaque is seen with associated short-segment stenosis of up to 50% by NASCET criteria (series 5, image 169). Right ICA tortuous and medialized into the retropharyngeal space. Right ICA otherwise widely patent distally to the skull base without stenosis, dissection or occlusion. Left carotid system: Left common carotid artery patent from its origin to the bifurcation without stenosis. Scattered eccentric mixed plaque about the left bifurcation/proximal left ICA without hemodynamically significant stenosis. Proximal left ICA tortuous and medialized into the retropharyngeal space. Left ICA widely patent distally to the skull base without stenosis, dissection or occlusion. Vertebral arteries: Both vertebral arteries arise from the subclavian arteries. Strongly dominant right vertebral artery with a markedly hypoplastic left vertebral artery noted. Dominant right vertebral artery patent within the neck without stenosis, dissection, or occlusion. Markedly diminutive left vertebral artery patent as well without appreciable stenosis or other vascular abnormality. Skeleton: Alignment: Straightening of the normal cervical lordosis. No listhesis or malalignment. Skull base and vertebrae: Skull base intact. Normal C1-2 articulations are preserved in the dens is intact.  Vertebral body heights maintained. No acute fracture. Soft tissues and spinal canal: Soft tissues of the neck demonstrate no acute finding. No abnormal prevertebral edema. Spinal canal within normal limits. Disc levels: Mild multilevel cervical spondylosis without significant stenosis, most pronounced at C6-7. Upper chest: Visualized upper chest demonstrates no acute finding. Partially visualized lung apices are clear. Other: None. Other neck: No other acute soft tissue abnormality within the neck. No mass lesion or adenopathy. Subcentimeter calcification noted within the left lobe of thyroid. Upper chest: Visualized upper chest demonstrates no acute finding. Partially visualized lungs are grossly clear. Review of the MIP images confirms the above findings CTA HEAD FINDINGS Anterior circulation: Petrous segments widely patent bilaterally. Scattered atheromatous plaque throughout the carotid siphons with relatively mild multifocal stenosis. A1 segments patent bilaterally. Normal anterior communicating artery complex. Anterior cerebral arteries widely patent proximally. Short-segment severe distal right ACA stenosis noted (series 5, image 62). ACAs otherwise widely patent to their distal aspects. No M1 stenosis or occlusion. Normal MCA bifurcations. Distal MCA branches well perfused and symmetric. Posterior circulation: Dominant right vertebral artery patent to the vertebrobasilar junction without stenosis. Right PICA not visualized. Hypoplastic left vertebral artery patent to the vertebrobasilar junction as well. Left PICA patent. Basilar patent to its distal aspect without stenosis. Superior cerebral arteries patent bilaterally. Left PCA supplied via the basilar. Right PCA supplied via a hypoplastic right P1 segment and robust right posterior communicating artery. Both PCAs well perfused to their distal aspects without stenosis. Venous sinuses: Patent. Anatomic variants: Predominant fetal type origin of the right  PCA. No intracranial aneurysm. Review of the MIP images confirms the above findings CT Brain Perfusion Findings: ASPECTS: 10 CBF (<30%) Volume: 0mL Perfusion (Tmax>6.0s) volume: 0mL Mismatch Volume: 0mL Infarction Location:Negative CT perfusion for acute core infarct or other perfusion deficit. IMPRESSION: CTA HEAD AND NECK IMPRESSION: 1. Negative CTA for emergent large vessel occlusion. 2. 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 3. Single short-segment  severe distal right ACA stenosis as above. 4. Additional moderate atheromatous disease about the major arterial vasculature of the head and neck as above. No other hemodynamically significant or correctable stenosis. CT PERFUSION IMPRESSION: Negative CT perfusion. No evidence for acute core infarct or other perfusion deficit. CT CERVICAL SPINE IMPRESSION: No acute traumatic injury within the cervical spine. These results were communicated to Dr. Wilford Corner at approximately 9:00 pmon 6/6/2021by text page via the [facility] messaging system. Electronically Signed   By: Rise Mu M.D.   On: 10/01/2019 21:50   CT HEAD CODE STROKE WO CONTRAST  Result Date: 10/01/2019 CLINICAL DATA:  Code stroke. Initial evaluation for acute ataxia, left-sided weakness, altered mental status. EXAM: CT HEAD WITHOUT CONTRAST TECHNIQUE: Contiguous axial images were obtained from the base of the skull through the vertex without intravenous contrast. COMPARISON:  None available. FINDINGS: Brain: Generalized age-related cerebral atrophy. Patchy and confluent hypodensity throughout the periventricular deep white matter both cerebral hemispheres, most consistent with chronic small vessel ischemic disease, advanced in nature. Chronic small microvascular ischemic changes noted involving the deep gray nuclei and pons as well. Focal encephalomalacia within the right parieto-occipital region compatible with a chronic  ischemic infarct. No acute intracranial hemorrhage. No acute large vessel territory infarct. No mass lesion, midline shift or mass effect. No hydrocephalus or extra-axial fluid collection. Vascular: No hyperdense vessel. Scattered vascular calcifications noted within the carotid siphons. Skull: Scalp soft tissues demonstrate no acute finding. Calvarium intact. Sinuses/Orbits: Globes and orbital soft tissues within normal limits. Left maxillary sinus retention cyst noted. Scattered mucosal thickening noted throughout the sphenoid ethmoidal sinuses, with superimposed air-fluid level within the right sphenoid sinus. Trace left mastoid effusion. Other: None. ASPECTS [facility] Stroke Program Early CT Score) - Ganglionic level infarction (caudate, lentiform nuclei, internal capsule, insula, M1-M3 cortex): 7 - Supraganglionic infarction (M4-M6 cortex): 3 Total score (0-10 with 10 being normal): 10 IMPRESSION: 1. No acute intracranial infarct or other abnormality. 2. ASPECTS is 10. 3. Chronic right parieto-occipital infarct. 4. Underlying age-related cerebral atrophy with advanced chronic microvascular ischemic disease. Critical Value/emergent results were called by telephone at the time of interpretation on 10/01/2019 at 7:57 pm to provider Dr. Wilford Corner, Who verbally acknowledged these results. Electronically Signed   By: Rise Mu M.D.   On: 10/01/2019 20:10    EKG: Independently reviewed.  Sinus tachycardia (heart rate 101).  Assessment/Plan Principal Problem:   Acute CVA (cerebrovascular accident) (HCC) Active Problems:   Hypokalemia   Severe dehydration   Grief reaction   RLS (restless legs syndrome)   Acute CVA: Family reported slurred speech and left-sided weakness.  LKN at around 2:30 or 3 PM.  At present, she has left upper extremity weakness on exam.  Patient was seen by neurology and felt to be outside the window for IV TPA.Marland Kitchen  Head CT negative for acute infarct.  CTA head and neck negative for  LVO.  CT cerebral perfusion study negative.  Neurology thinks her presentation is due to an acute right MCA territory infarct. -Telemetry monitoring -Allow for permissive hypertension-treat only if systolic blood pressures are greater than 220. -MRI of the brain without contrast -2D echocardiogram -Hemoglobin A1c, fasting lipid panel -Aspirin 325 p.o. now and daily -Atorvastatin 80 mg daily -Frequent neurochecks -PT, OT, speech therapy. -N.p.o. until cleared by bedside swallow evaluation or formal speech evaluation  Severe hypokalemia: Likely due to very poor oral intake in the setting of recent loss of her mother.  Not on a diuretic.  Not endorsing vomiting  or diarrhea.  Potassium 2.3.  EKG without acute changes. -Continue potassium repletion.  Check magnesium level and replete if low.  Continue to monitor BMP.  Severe dehydration: She has very dry mucous membranes on exam.  Reports very poor oral intake in setting of recent loss of her mother.  Creatinine slightly up from baseline. -Give IV fluid hydration and continue to monitor  Acute grief: Patient's mother recently passed away and she is very upset. -Continue to provide emotional support  Restless leg syndrome -Resume home Requip  Pharmacy med rec pending.  DVT prophylaxis: Lovenox Code Status: Full code Family Communication: No family available at this time. ADDENDUM: Patient's daughter has been updated over the phone.  Keeleigh Terris [phone].  Disposition Plan: Status is: Inpatient  Remains inpatient appropriate because:Ongoing diagnostic testing needed not appropriate for outpatient work up and Inpatient level of care appropriate due to severity of illness   Dispo: The patient is from: Home              Anticipated d/c is to: SNF              Anticipated d/c date is: 3 days              Patient currently is not medically stable to d/c.  The medical decision making on this patient was of high complexity and the  patient is at high risk for clinical deterioration, therefore this is a level 3 visit.  John Giovanni MD [facility]  If 7PM-7AM, please contact night-coverage [email or web address]  10/02/2019, 12:01 AM

## 2019-10-01 NOTE — Consult Note (Addendum)
Neurology Consultation  Reason for Consult: Code stroke Referring Physician: Dr. Darl Householder  CC: Left arm weakness  History is obtained from: Patient, chart  HPI: [name] Good is a 69 y.o. female past medical history of myocardial infarction, multiple back surgeries and chronic back pain, presented to the emergency room as a code stroke for acute onset of left-sided weakness. The patient was last spoken to and known normal at somewhere around 230 or 3 PM today by family.  They went to check on her at 5 PM after she called and said that she had a fall.  They noted that her speech was a little slurred and she was having difficulty getting up due to weakness on the left side.  EMS was called.  EMS noted left arm weakness and drift and slurred speech and activated a code stroke with a last known normal of 5 PM when the family confirmed that last known well was somewhere between 230 to 3 PM. We considered the last known normal of 3 PM, and by the time she was in the emergency room, she was outside the window for IV TPA. Also of note, family said that she was supposed to have outpatient head imaging because of a fall that she sustained on Wednesday with no clear reason.  They were not sure if it was a syncopal episode.  No reported weakness at that time. No facial drooping reported. No chest pain shortness of breath nausea vomiting. The patient recently lost her mother and was very emotional throughout the encounter.  Her mother supposed to be buried tomorrow   LKW: 3 PM on 10/01/2019 tpa given?: no, outside the window Premorbid modified Rankin scale (mRS): 1 -uses cane for ambulation due to back issues but is independent otherwise   ROS: Performed and negative except noted in the HPI  Past Medical History:  Diagnosis Date  . Arthritis   . Depression   . GERD (gastroesophageal reflux disease)   . Myocardial infarction ([place]) 89   no visits to cardiac dr(thomas kelly) since 89  . Wound dehiscence     lumbar   No family history on file.   Social History:   reports that she quit smoking about 6 years ago. Her smoking use included cigarettes. She has a 80.00 pack-year smoking history. She has never used smokeless tobacco. She reports current alcohol use. She reports that she does not use drugs.  Medications  Current Facility-Administered Medications:  .  sodium chloride flush (NS) 0.9 % injection 3 mL, 3 mL, Intravenous, Once, Drenda Freeze, MD  Current Outpatient Medications:  .  aspirin 325 MG EC tablet, Take 1 tablet (325 mg total) by mouth daily., Disp: 30 tablet, Rfl: 0 .  calcium carbonate (TUMS EX) 750 MG chewable tablet, Chew 1 tablet by mouth 2 (two) times daily as needed for heartburn., Disp: , Rfl:  .  doxycycline (VIBRAMYCIN) 100 MG capsule, Take 1 capsule (100 mg total) by mouth 2 (two) times daily., Disp: 12 capsule, Rfl: 0 .  methocarbamol (ROBAXIN) 500 MG tablet, Take 1 tablet (500 mg total) by mouth every 6 (six) hours as needed for muscle spasms., Disp: 60 tablet, Rfl: 1 .  methylPREDNISolone (MEDROL) 4 MG tablet, Medrol dose pack. Take as instructed, Disp: 21 tablet, Rfl: 0 .  omeprazole (PRILOSEC) 20 MG capsule, Take 20 mg by mouth daily as needed (for acid reflux). , Disp: , Rfl:  .  phentermine (ADIPEX-P) 37.5 MG tablet, Take 37.5 mg by mouth  daily as needed (appetite). , Disp: , Rfl:  .  predniSONE (DELTASONE) 5 MG tablet, Take 1 tablet (5 mg total) by mouth daily with breakfast. (Patient taking differently: Take 10 mg by mouth daily with breakfast. ), Disp: 30 tablet, Rfl: 1 .  rOPINIRole (REQUIP) 3 MG tablet, Take 3 mg by mouth at bedtime as needed (restless leg). , Disp: , Rfl:  .  sertraline (ZOLOFT) 100 MG tablet, Take 100 mg by mouth daily., Disp: , Rfl:    Exam: Current vital signs: BP (!) 180/91 (BP Location: Right Arm)   Pulse 100   Temp 97.7 F (36.5 C) (Oral)   Resp 18   SpO2 98%  Vital signs in last 24 hours: Temp:  [97.7 F (36.5 C)]  97.7 F (36.5 C) (06/06 2048) Pulse Rate:  [100] 100 (06/06 2048) Resp:  [18] 18 (06/06 2048) BP: (180)/(91) 180/91 (06/06 2048) SpO2:  [98 %] 98 % (06/06 2048) General: Awake alert very emotional but in no apparent distress HEENT: Normocephalic atraumatic Lungs: Clear Cardiovascular: Regular rhythm Abdomen soft nondistended nontender Extremities warm well perfused Neurological exam Awake alert oriented x3 No evidence of aphasia Mild dysarthria Cranial nerves: Pupils equal round reactive light, extraocular movement examination reveals some right gaze preference, although she is able to look to the left she does not go all the way to date her eye into the canthus to the left, there is definite left hemianopsia, face appears symmetric, tongue and palate midline. Motor exam: Her left upper and lower extremity do have a vertical drift.  Right side is full strength. Sensory exam: She has normal sensation on right and left but on double semistimulation, she does extinguishes the left side.  She also extinguishes visual stimulation on double simultaneous stimulation on the left. Coordination: No dysmetria Gait testing deferred at this time  NIHSS 1a Level of Conscious.: 0 1b LOC Questions: 0 1c LOC Commands: 0 2 Best Gaze: 1 3 Visual: 2 4 Facial Palsy: 0 5a Motor Arm - left: 1 5b Motor Arm - Right: 0 6a Motor Leg - Left: 1 6b Motor Leg - Right: 0 7 Limb Ataxia: 0 8 Sensory: 0 9 Best Language: 0 10 Dysarthria: 1 11 Extinct. and Inatten.: 2 TOTAL: 8  Labs I have reviewed labs in epic and the results pertinent to this consultation are:  CBC    Component Value Date/Time   WBC 13.6 (H) 10/19/2017 0512   RBC 3.45 (L) 10/19/2017 0512   HGB 10.8 (L) 10/19/2017 0512   HCT 34.0 (L) 10/19/2017 0512   PLT 195 10/19/2017 0512   MCV 98.6 10/19/2017 0512   MCH 31.3 10/19/2017 0512   MCHC 31.8 10/19/2017 0512   RDW 13.2 10/19/2017 0512   LYMPHSABS 1.8 10/19/2017 0512   MONOABS 0.9  10/19/2017 0512   EOSABS 0.2 10/19/2017 0512   BASOSABS 0.1 10/19/2017 0512    CMP     Component Value Date/Time   NA 142 10/18/2017 0529   K 3.5 10/18/2017 0529   CL 112 (H) 10/18/2017 0529   CO2 23 10/18/2017 0529   GLUCOSE 118 (H) 10/18/2017 0529   BUN 14 10/18/2017 0529   CREATININE 0.66 10/18/2017 0529   CALCIUM 8.1 (L) 10/18/2017 0529   PROT 3.6 (L) 10/16/2017 0605   ALBUMIN 1.8 (L) 10/16/2017 0605   AST 29 10/16/2017 0605   ALT 20 10/16/2017 0605   ALKPHOS 49 10/16/2017 0605   BILITOT 0.6 10/16/2017 0605   GFRNONAA >60 10/18/2017 4401  GFRAA >60 10/18/2017 0529    Imaging I have reviewed the images obtained:  CT-scan of the brain-there is a hypodensity in the right parieto-occipital region which is likely a chronic infarct but the family does not report any old strokes.  There is patchy and confluent hypodensity throughout the periventricular deep white matter consistent with chronic small vessel ischemic disease.  There are chronic microvascular ischemic changes in the deep gray nuclei and pons as well. CTA head and neck with no emergent LVO. CT perfusion with no perfusion deficit  Assessment: 69 year old with above past medical history presenting with sudden onset of left-sided weakness. On examination she does have evidence of right hemispheric stroke with cortical features as she has neglect and hemianopsia but the CTA did not show any perfusion deficit. I suspect that give the findings of chronic right-sided encephalomalacia on her CT head, she has had strokes before without realizing and she might be having an acute on chronic stroke involving the right hemisphere. At this time, she is outside the window for IV TPA. Due to no perfusion deficit and no LVO visualized on CTA head and neck, she is not a candidate for EVT.  She definitely will benefit from admission and stroke work-up  Impression: Acute right MCA territory infarct  Recommendations: Admit to  hospitalist Telemetry Frequent neurochecks I would obtain an MRI of the brain without contrast Aspirin 325 Atorvastatin 80 2D echo Lipid panel A1c PT OT Speech therapy Allow for permissive hypertension and treat only if systolic blood pressures are above 220 on a as needed basis.  Stroke neurology will follow with you.  I discussed my plan with the ED provider Dr. Silverio Lay as well as the patient's daughter Ms. Ainsley Spinner and answered all her questions.   -- Milon Dikes, MD [facility] Pager: [phone] If 7pm to 7am, please call on call as listed on AMION.   CRITICAL CARE ATTESTATION Performed by: Milon Dikes, MD Total critical care time: 60 minutes Critical care time was exclusive of separately billable procedures and treating other patients and/or supervising APPs/Residents/Students Critical care was necessary to treat or prevent imminent or life-threatening deterioration due to acute ischemic stroke This patient is critically ill and at significant risk for neurological worsening and/or death and care requires constant monitoring. Critical care was time spent personally by me on the following activities: development of treatment plan with patient and/or surrogate as well as nursing, discussions with consultants, evaluation of patient's response to treatment, examination of patient, obtaining history from patient or surrogate, ordering and performing treatments and interventions, ordering and review of laboratory studies, ordering and review of radiographic studies, pulse oximetry, re-evaluation of patient's condition, participation in multidisciplinary rounds and medical decision making of high complexity in the care of this patient.   ADDENDUM 0652hr MRI brain with a right thalamic infarct. Stroke w/u as above.  -- Milon Dikes, MD [facility] Pager: [phone] If 7pm to 7am, please call on call as listed on AMION.

## 2019-10-01 NOTE — Code Documentation (Signed)
69 yo female code stroke via EMS with left sided weakness and neglect after fall. CBG 131. LSW 1500. NIHSS 8. CT/CTA/CTP. Difficult IV access required ultrasound access by Dr. Thurnell Lose. No LVO. Dr. Wilford Corner at bedside updating patient and family.

## 2019-10-01 NOTE — ED Provider Notes (Signed)
[facility] EMERGENCY DEPARTMENT Provider Note   CSN: [id number] Arrival date & time: 10/01/19  1948  An emergency department physician performed an initial assessment on this suspected stroke patient at 65.  History Chief Complaint  Patient presents with  . Code Stroke    [name] Good is a 69 y.o. female.  69 y.o female with a PMH of depression, GERD, MI presents to the ED via EMS for left sided weakness and neglect after fall. Level 5 caveat due to AMS.       The history is provided by the patient.       Past Medical History:  Diagnosis Date  . Arthritis   . Depression   . GERD (gastroesophageal reflux disease)   . Myocardial infarction ([place]) 89   no visits to cardiac dr(thomas kelly) since 92  . Wound dehiscence    lumbar    Patient Active Problem List   Diagnosis Date Noted  . Acute CVA (cerebrovascular accident) ([place]) 10/01/2019  . Cellulitis of left upper extremity 10/18/2017  . Arthritis 10/18/2017  . Septic shock ([place]) 10/16/2017  . Former smoker 11/13/2016  . History of restless legs syndrome 11/13/2016  . Nontraumatic incomplete tear of right rotator cuff 08/18/2016  . Autoimmune disease ([place]) 08/12/2016  . Primary osteoarthritis of both knees 08/12/2016  . Primary osteoarthritis of both feet 08/12/2016  . Obesity (BMI 35.0-39.9 without comorbidity) 08/12/2016  . Osteoarthritis of right hip 10/25/2015  . 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 S/P lumbar laminectomy 06/14/2015    Past Surgical History:  Procedure Laterality Date  . APPENDECTOMY     17 yrs  . BREAST SURGERY Left    cyst  . CARDIAC CATHETERIZATION  89  . CHOLECYSTECTOMY     69 yrs old  . LUMBAR LAMINECTOMY/DECOMPRESSION MICRODISCECTOMY N/A 06/14/2015   Procedure: Lumbar Three-Four,Lumbar Four-Five, Lumbar Five-Sacral One Laminectomy;  Surgeon: Eustace Moore, MD;  Location: [facility];  Service: Neurosurgery;  Laterality: N/A;  . LUMBAR  WOUND DEBRIDEMENT N/A 07/24/2015   Procedure: lumbar wound revision;  Surgeon: Eustace Moore, MD;  Location: [facility];  Service: Neurosurgery;  Laterality: N/A;  . TOTAL HIP ARTHROPLASTY Right 10/25/2015   Procedure: RIGHT TOTAL HIP ARTHROPLASTY ANTERIOR APPROACH;  Surgeon: Mcarthur Rossetti, MD;  Location: [facility];  Service: Orthopedics;  Laterality: Right;  . TUBAL LIGATION       OB History   No obstetric history on file.     History reviewed. No pertinent family history.  Social History   Tobacco Use  . Smoking status: Former Smoker    Packs/day: 2.00    Years: 40.00    Pack years: 80.00    Types: Cigarettes    Quit date: 04/10/2013    Years since quitting: 6.4  . Smokeless tobacco: Never Used  Substance Use Topics  . Alcohol use: Yes    Comment: rarely  . Drug use: No    Home Medications Prior to Admission medications   Medication Sig Start Date End Date Taking? Authorizing Provider  aspirin 325 MG EC tablet Take 1 tablet (325 mg total) by mouth daily. 10/19/17   Shelly Coss, MD  calcium carbonate (TUMS EX) 750 MG chewable tablet Chew 1 tablet by mouth 2 (two) times daily as needed for heartburn.    [provider]  doxycycline (VIBRAMYCIN) 100 MG capsule Take 1 capsule (100 mg total) by mouth 2 (two) times daily. 10/20/17   Adhikari,  Amrit, MD  methocarbamol (ROBAXIN) 500 MG tablet Take 1 tablet (500 mg total) by mouth every 6 (six) hours as needed for muscle spasms. 05/29/19   Kathryne Hitch, MD  methylPREDNISolone (MEDROL) 4 MG tablet Medrol dose pack. Take as instructed 05/29/19   Kathryne Hitch, MD  omeprazole (PRILOSEC) 20 MG capsule Take 20 mg by mouth daily as needed (for acid reflux).     [provider]  phentermine (ADIPEX-P) 37.5 MG tablet Take 37.5 mg by mouth daily as needed (appetite).     [provider]  predniSONE (DELTASONE) 5 MG tablet Take 1 tablet (5 mg total) by mouth daily with breakfast. Patient  taking differently: Take 10 mg by mouth daily with breakfast.  08/18/16   Pollyann Savoy, MD  rOPINIRole (REQUIP) 3 MG tablet Take 3 mg by mouth at bedtime as needed (restless leg).  07/10/16   [provider]  sertraline (ZOLOFT) 100 MG tablet Take 100 mg by mouth daily.    [provider]    Allergies    Amoxicillin  Review of Systems   Review of Systems  Unable to perform ROS: Mental status change    Physical Exam Updated Vital Signs BP (!) 164/79   Pulse (!) 103   Temp 97.7 F (36.5 C) (Oral)   Resp (!) 24   SpO2 97%   Physical Exam Vitals and nursing note reviewed.  Constitutional:      Appearance: She is ill-appearing.  HENT:     Head: Normocephalic and atraumatic.     Nose: Nose normal.     Mouth/Throat:     Mouth: Mucous membranes are dry.  Eyes:     Pupils: Pupils are equal, round, and reactive to light.  Cardiovascular:     Rate and Rhythm: Normal rate.  Pulmonary:     Effort: Pulmonary effort is normal.  Abdominal:     General: Abdomen is flat.  Musculoskeletal:     Cervical back: Normal range of motion and neck supple.  Skin:    General: Skin is warm and dry.  Neurological:     Mental Status: She is alert. Mental status is at baseline.     Comments: Slurred speech,left sided neglect.  Left arm weakness.      ED Results / Procedures / Treatments   Labs (all labs ordered are listed, but only abnormal results are displayed) Labs Reviewed  APTT - Abnormal; Notable for the following components:      Result Value   aPTT 23 (*)    All other components within normal limits  COMPREHENSIVE METABOLIC PANEL - Abnormal; Notable for the following components:   Potassium 2.3 (*)    Glucose, Bld 106 (*)    Creatinine, Ser 1.08 (*)    Total Protein 6.1 (*)    GFR calc non Af Amer 52 (*)    All other components within normal limits  I-STAT CHEM 8, ED - Abnormal; Notable for the following components:   Potassium 2.3 (*)    Glucose, Bld  101 (*)    All other components within normal limits  PROTIME-INR  CBC  DIFFERENTIAL  CBG MONITORING, ED    EKG None  Radiology CT Code Stroke CTA Head W/WO contrast  Result Date: 10/01/2019 CLINICAL DATA:  Initial evaluation for acute stroke, left-sided weakness, altered mental status. EXAM: CT ANGIOGRAPHY HEAD AND NECK CT PERFUSION BRAIN TECHNIQUE: Multidetector CT imaging of the head and neck was performed using the standard protocol during bolus  administration of intravenous contrast. Multiplanar CT image reconstructions and MIPs were obtained to evaluate the vascular anatomy. Carotid stenosis measurements (when applicable) are obtained utilizing NASCET criteria, using the distal internal carotid diameter as the denominator. Multiphase CT imaging of the brain was performed following IV bolus contrast injection. Subsequent parametric perfusion maps were calculated using RAPID software. CONTRAST:  OMNIPAQUE IOHEXOL 350 MG/ML SOLN COMPARISON:  Comparison made with prior noncontrast head CT from earlier same day. FINDINGS: CTA NECK FINDINGS Aortic arch: Visualized aortic arch of normal caliber with normal branch pattern. Moderate atheromatous plaque seen about the arch and origin of the great vessels without hemodynamically significant stenosis. Note made of soft plaque protruding into the lumen of the distal arch/proximal intrathoracic aorta (series 5, image 307). Visualized subclavian arteries widely patent. Right carotid system: Right common carotid artery patent from its origin to the bifurcation without flow-limiting stenosis. Scattered eccentric plaque about the right bifurcation without significant stenosis. Just distally within the proximal right ICA, additional focal plaque is seen with associated short-segment stenosis of up to 50% by NASCET criteria (series 5, image 169). Right ICA tortuous and medialized into the retropharyngeal space. Right ICA otherwise widely patent distally to the  skull base without stenosis, dissection or occlusion. Left carotid system: Left common carotid artery patent from its origin to the bifurcation without stenosis. Scattered eccentric mixed plaque about the left bifurcation/proximal left ICA without hemodynamically significant stenosis. Proximal left ICA tortuous and medialized into the retropharyngeal space. Left ICA widely patent distally to the skull base without stenosis, dissection or occlusion. Vertebral arteries: Both vertebral arteries arise from the subclavian arteries. Strongly dominant right vertebral artery with a markedly hypoplastic left vertebral artery noted. Dominant right vertebral artery patent within the neck without stenosis, dissection, or occlusion. Markedly diminutive left vertebral artery patent as well without appreciable stenosis or other vascular abnormality. Skeleton: Alignment: Straightening of the normal cervical lordosis. No listhesis or malalignment. Skull base and vertebrae: Skull base intact. Normal C1-2 articulations are preserved in the dens is intact. Vertebral body heights maintained. No acute fracture. Soft tissues and spinal canal: Soft tissues of the neck demonstrate no acute finding. No abnormal prevertebral edema. Spinal canal within normal limits. Disc levels: Mild multilevel cervical spondylosis without significant stenosis, most pronounced at C6-7. Upper chest: Visualized upper chest demonstrates no acute finding. Partially visualized lung apices are clear. Other: None. Other neck: No other acute soft tissue abnormality within the neck. No mass lesion or adenopathy. Subcentimeter calcification noted within the left lobe of thyroid. Upper chest: Visualized upper chest demonstrates no acute finding. Partially visualized lungs are grossly clear. Review of the MIP images confirms the above findings CTA HEAD FINDINGS Anterior circulation: Petrous segments widely patent bilaterally. Scattered atheromatous plaque throughout the  carotid siphons with relatively mild multifocal stenosis. A1 segments patent bilaterally. Normal anterior communicating artery complex. Anterior cerebral arteries widely patent proximally. Short-segment severe distal right ACA stenosis noted (series 5, image 62). ACAs otherwise widely patent to their distal aspects. No M1 stenosis or occlusion. Normal MCA bifurcations. Distal MCA branches well perfused and symmetric. Posterior circulation: Dominant right vertebral artery patent to the vertebrobasilar junction without stenosis. Right PICA not visualized. Hypoplastic left vertebral artery patent to the vertebrobasilar junction as well. Left PICA patent. Basilar patent to its distal aspect without stenosis. Superior cerebral arteries patent bilaterally. Left PCA supplied via the basilar. Right PCA supplied via a hypoplastic right P1 segment and robust right posterior communicating artery. Both PCAs well perfused to  their distal aspects without stenosis. Venous sinuses: Patent. Anatomic variants: Predominant fetal type origin of the right PCA. No intracranial aneurysm. Review of the MIP images confirms the above findings CT Brain Perfusion Findings: ASPECTS: 10 CBF (<30%) Volume: 0mL Perfusion (Tmax>6.0s) volume: 0mL Mismatch Volume: 0mL Infarction Location:Negative CT perfusion for acute core infarct or other perfusion deficit. IMPRESSION: CTA HEAD AND NECK IMPRESSION: 1. Negative CTA for emergent large vessel occlusion. 2. 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 3. Single short-segment severe distal right ACA stenosis as above. 4. Additional moderate atheromatous disease about the major arterial vasculature of the head and neck as above. No other hemodynamically significant or correctable stenosis. CT PERFUSION IMPRESSION: Negative CT perfusion. No evidence for acute core infarct or other perfusion deficit. CT CERVICAL SPINE IMPRESSION:  No acute traumatic injury within the cervical spine. These results were communicated to Dr. Wilford Corner at approximately 9:00 pmon 6/6/2021by text page via the [facility] messaging system. Electronically Signed   By: Rise Mu M.D.   On: 10/01/2019 21:50   CT Code Stroke CTA Neck W/WO contrast  Result Date: 10/01/2019 CLINICAL DATA:  Initial evaluation for acute stroke, left-sided weakness, altered mental status. EXAM: CT ANGIOGRAPHY HEAD AND NECK CT PERFUSION BRAIN TECHNIQUE: Multidetector CT imaging of the head and neck was performed using the standard protocol during bolus administration of intravenous contrast. Multiplanar CT image reconstructions and MIPs were obtained to evaluate the vascular anatomy. Carotid stenosis measurements (when applicable) are obtained utilizing NASCET criteria, using the distal internal carotid diameter as the denominator. Multiphase CT imaging of the brain was performed following IV bolus contrast injection. Subsequent parametric perfusion maps were calculated using RAPID software. CONTRAST:  OMNIPAQUE IOHEXOL 350 MG/ML SOLN COMPARISON:  Comparison made with prior noncontrast head CT from earlier same day. FINDINGS: CTA NECK FINDINGS Aortic arch: Visualized aortic arch of normal caliber with normal branch pattern. Moderate atheromatous plaque seen about the arch and origin of the great vessels without hemodynamically significant stenosis. Note made of soft plaque protruding into the lumen of the distal arch/proximal intrathoracic aorta (series 5, image 307). Visualized subclavian arteries widely patent. Right carotid system: Right common carotid artery patent from its origin to the bifurcation without flow-limiting stenosis. Scattered eccentric plaque about the right bifurcation without significant stenosis. Just distally within the proximal right ICA, additional focal plaque is seen with associated short-segment stenosis of up to 50% by NASCET criteria (series 5, image  169). Right ICA tortuous and medialized into the retropharyngeal space. Right ICA otherwise widely patent distally to the skull base without stenosis, dissection or occlusion. Left carotid system: Left common carotid artery patent from its origin to the bifurcation without stenosis. Scattered eccentric mixed plaque about the left bifurcation/proximal left ICA without hemodynamically significant stenosis. Proximal left ICA tortuous and medialized into the retropharyngeal space. Left ICA widely patent distally to the skull base without stenosis, dissection or occlusion. Vertebral arteries: Both vertebral arteries arise from the subclavian arteries. Strongly dominant right vertebral artery with a markedly hypoplastic left vertebral artery noted. Dominant right vertebral artery patent within the neck without stenosis, dissection, or occlusion. Markedly diminutive left vertebral artery patent as well without appreciable stenosis or other vascular abnormality. Skeleton: Alignment: Straightening of the normal cervical lordosis. No listhesis or malalignment. Skull base and vertebrae: Skull base intact. Normal C1-2 articulations are preserved in the dens is intact. Vertebral body heights maintained. No acute fracture. Soft tissues and spinal  canal: Soft tissues of the neck demonstrate no acute finding. No abnormal prevertebral edema. Spinal canal within normal limits. Disc levels: Mild multilevel cervical spondylosis without significant stenosis, most pronounced at C6-7. Upper chest: Visualized upper chest demonstrates no acute finding. Partially visualized lung apices are clear. Other: None. Other neck: No other acute soft tissue abnormality within the neck. No mass lesion or adenopathy. Subcentimeter calcification noted within the left lobe of thyroid. Upper chest: Visualized upper chest demonstrates no acute finding. Partially visualized lungs are grossly clear. Review of the MIP images confirms the above findings CTA  HEAD FINDINGS Anterior circulation: Petrous segments widely patent bilaterally. Scattered atheromatous plaque throughout the carotid siphons with relatively mild multifocal stenosis. A1 segments patent bilaterally. Normal anterior communicating artery complex. Anterior cerebral arteries widely patent proximally. Short-segment severe distal right ACA stenosis noted (series 5, image 62). ACAs otherwise widely patent to their distal aspects. No M1 stenosis or occlusion. Normal MCA bifurcations. Distal MCA branches well perfused and symmetric. Posterior circulation: Dominant right vertebral artery patent to the vertebrobasilar junction without stenosis. Right PICA not visualized. Hypoplastic left vertebral artery patent to the vertebrobasilar junction as well. Left PICA patent. Basilar patent to its distal aspect without stenosis. Superior cerebral arteries patent bilaterally. Left PCA supplied via the basilar. Right PCA supplied via a hypoplastic right P1 segment and robust right posterior communicating artery. Both PCAs well perfused to their distal aspects without stenosis. Venous sinuses: Patent. Anatomic variants: Predominant fetal type origin of the right PCA. No intracranial aneurysm. Review of the MIP images confirms the above findings CT Brain Perfusion Findings: ASPECTS: 10 CBF (<30%) Volume: 93mL Perfusion (Tmax>6.0s) volume: 34mL Mismatch Volume: 75mL Infarction Location:Negative CT perfusion for acute core infarct or other perfusion deficit. IMPRESSION: CTA HEAD AND NECK IMPRESSION: 1. Negative CTA for emergent large vessel occlusion. 2. 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 3. Single short-segment severe distal right ACA stenosis as above. 4. Additional moderate atheromatous disease about the major arterial vasculature of the head and neck as above. No other hemodynamically significant or correctable stenosis. CT PERFUSION  IMPRESSION: Negative CT perfusion. No evidence for acute core infarct or other perfusion deficit. CT CERVICAL SPINE IMPRESSION: No acute traumatic injury within the cervical spine. These results were communicated to Dr. Wilford Corner at approximately 9:00 pmon 6/6/2021by text page via the [facility] messaging system. Electronically Signed   By: Rise Mu M.D.   On: 10/01/2019 21:50   CT C-SPINE NO CHARGE  Result Date: 10/01/2019 CLINICAL DATA:  Initial evaluation for acute stroke, left-sided weakness, altered mental status. EXAM: CT ANGIOGRAPHY HEAD AND NECK CT PERFUSION BRAIN TECHNIQUE: Multidetector CT imaging of the head and neck was performed using the standard protocol during bolus administration of intravenous contrast. Multiplanar CT image reconstructions and MIPs were obtained to evaluate the vascular anatomy. Carotid stenosis measurements (when applicable) are obtained utilizing NASCET criteria, using the distal internal carotid diameter as the denominator. Multiphase CT imaging of the brain was performed following IV bolus contrast injection. Subsequent parametric perfusion maps were calculated using RAPID software. CONTRAST:  OMNIPAQUE IOHEXOL 350 MG/ML SOLN COMPARISON:  Comparison made with prior noncontrast head CT from earlier same day. FINDINGS: CTA NECK FINDINGS Aortic arch: Visualized aortic arch of normal caliber with normal branch pattern. Moderate atheromatous plaque seen about the arch and origin of the great vessels without hemodynamically significant stenosis. Note made of soft plaque protruding into the lumen of the  distal arch/proximal intrathoracic aorta (series 5, image 307). Visualized subclavian arteries widely patent. Right carotid system: Right common carotid artery patent from its origin to the bifurcation without flow-limiting stenosis. Scattered eccentric plaque about the right bifurcation without significant stenosis. Just distally within the proximal right ICA, additional  focal plaque is seen with associated short-segment stenosis of up to 50% by NASCET criteria (series 5, image 169). Right ICA tortuous and medialized into the retropharyngeal space. Right ICA otherwise widely patent distally to the skull base without stenosis, dissection or occlusion. Left carotid system: Left common carotid artery patent from its origin to the bifurcation without stenosis. Scattered eccentric mixed plaque about the left bifurcation/proximal left ICA without hemodynamically significant stenosis. Proximal left ICA tortuous and medialized into the retropharyngeal space. Left ICA widely patent distally to the skull base without stenosis, dissection or occlusion. Vertebral arteries: Both vertebral arteries arise from the subclavian arteries. Strongly dominant right vertebral artery with a markedly hypoplastic left vertebral artery noted. Dominant right vertebral artery patent within the neck without stenosis, dissection, or occlusion. Markedly diminutive left vertebral artery patent as well without appreciable stenosis or other vascular abnormality. Skeleton: Alignment: Straightening of the normal cervical lordosis. No listhesis or malalignment. Skull base and vertebrae: Skull base intact. Normal C1-2 articulations are preserved in the dens is intact. Vertebral body heights maintained. No acute fracture. Soft tissues and spinal canal: Soft tissues of the neck demonstrate no acute finding. No abnormal prevertebral edema. Spinal canal within normal limits. Disc levels: Mild multilevel cervical spondylosis without significant stenosis, most pronounced at C6-7. Upper chest: Visualized upper chest demonstrates no acute finding. Partially visualized lung apices are clear. Other: None. Other neck: No other acute soft tissue abnormality within the neck. No mass lesion or adenopathy. Subcentimeter calcification noted within the left lobe of thyroid. Upper chest: Visualized upper chest demonstrates no acute  finding. Partially visualized lungs are grossly clear. Review of the MIP images confirms the above findings CTA HEAD FINDINGS Anterior circulation: Petrous segments widely patent bilaterally. Scattered atheromatous plaque throughout the carotid siphons with relatively mild multifocal stenosis. A1 segments patent bilaterally. Normal anterior communicating artery complex. Anterior cerebral arteries widely patent proximally. Short-segment severe distal right ACA stenosis noted (series 5, image 62). ACAs otherwise widely patent to their distal aspects. No M1 stenosis or occlusion. Normal MCA bifurcations. Distal MCA branches well perfused and symmetric. Posterior circulation: Dominant right vertebral artery patent to the vertebrobasilar junction without stenosis. Right PICA not visualized. Hypoplastic left vertebral artery patent to the vertebrobasilar junction as well. Left PICA patent. Basilar patent to its distal aspect without stenosis. Superior cerebral arteries patent bilaterally. Left PCA supplied via the basilar. Right PCA supplied via a hypoplastic right P1 segment and robust right posterior communicating artery. Both PCAs well perfused to their distal aspects without stenosis. Venous sinuses: Patent. Anatomic variants: Predominant fetal type origin of the right PCA. No intracranial aneurysm. Review of the MIP images confirms the above findings CT Brain Perfusion Findings: ASPECTS: 10 CBF (<30%) Volume: 0mL Perfusion (Tmax>6.0s) volume: 0mL Mismatch Volume: 0mL Infarction Location:Negative CT perfusion for acute core infarct or other perfusion deficit. IMPRESSION: CTA HEAD AND NECK IMPRESSION: 1. Negative CTA for emergent large vessel occlusion. 2. 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 3. Single short-segment severe distal right ACA stenosis as above. 4. Additional moderate atheromatous disease about the major arterial  vasculature of the head and neck as above. No other hemodynamically  significant or correctable stenosis. CT PERFUSION IMPRESSION: Negative CT perfusion. No evidence for acute core infarct or other perfusion deficit. CT CERVICAL SPINE IMPRESSION: No acute traumatic injury within the cervical spine. These results were communicated to Dr. Wilford Corner at approximately 9:00 pmon 6/6/2021by text page via the [facility] messaging system. Electronically Signed   By: Rise Mu M.D.   On: 10/01/2019 21:50   CT Code Stroke Cerebral Perfusion with contrast  Result Date: 10/01/2019 CLINICAL DATA:  Initial evaluation for acute stroke, left-sided weakness, altered mental status. EXAM: CT ANGIOGRAPHY HEAD AND NECK CT PERFUSION BRAIN TECHNIQUE: Multidetector CT imaging of the head and neck was performed using the standard protocol during bolus administration of intravenous contrast. Multiplanar CT image reconstructions and MIPs were obtained to evaluate the vascular anatomy. Carotid stenosis measurements (when applicable) are obtained utilizing NASCET criteria, using the distal internal carotid diameter as the denominator. Multiphase CT imaging of the brain was performed following IV bolus contrast injection. Subsequent parametric perfusion maps were calculated using RAPID software. CONTRAST:  OMNIPAQUE IOHEXOL 350 MG/ML SOLN COMPARISON:  Comparison made with prior noncontrast head CT from earlier same day. FINDINGS: CTA NECK FINDINGS Aortic arch: Visualized aortic arch of normal caliber with normal branch pattern. Moderate atheromatous plaque seen about the arch and origin of the great vessels without hemodynamically significant stenosis. Note made of soft plaque protruding into the lumen of the distal arch/proximal intrathoracic aorta (series 5, image 307). Visualized subclavian arteries widely patent. Right carotid system: Right common carotid artery patent from its origin to the bifurcation without flow-limiting  stenosis. Scattered eccentric plaque about the right bifurcation without significant stenosis. Just distally within the proximal right ICA, additional focal plaque is seen with associated short-segment stenosis of up to 50% by NASCET criteria (series 5, image 169). Right ICA tortuous and medialized into the retropharyngeal space. Right ICA otherwise widely patent distally to the skull base without stenosis, dissection or occlusion. Left carotid system: Left common carotid artery patent from its origin to the bifurcation without stenosis. Scattered eccentric mixed plaque about the left bifurcation/proximal left ICA without hemodynamically significant stenosis. Proximal left ICA tortuous and medialized into the retropharyngeal space. Left ICA widely patent distally to the skull base without stenosis, dissection or occlusion. Vertebral arteries: Both vertebral arteries arise from the subclavian arteries. Strongly dominant right vertebral artery with a markedly hypoplastic left vertebral artery noted. Dominant right vertebral artery patent within the neck without stenosis, dissection, or occlusion. Markedly diminutive left vertebral artery patent as well without appreciable stenosis or other vascular abnormality. Skeleton: Alignment: Straightening of the normal cervical lordosis. No listhesis or malalignment. Skull base and vertebrae: Skull base intact. Normal C1-2 articulations are preserved in the dens is intact. Vertebral body heights maintained. No acute fracture. Soft tissues and spinal canal: Soft tissues of the neck demonstrate no acute finding. No abnormal prevertebral edema. Spinal canal within normal limits. Disc levels: Mild multilevel cervical spondylosis without significant stenosis, most pronounced at C6-7. Upper chest: Visualized upper chest demonstrates no acute finding. Partially visualized lung apices are clear. Other: None. Other neck: No other acute soft tissue abnormality within the neck. No mass  lesion or adenopathy. Subcentimeter calcification noted within the left lobe of thyroid. Upper chest: Visualized upper chest demonstrates no acute finding. Partially visualized lungs are grossly clear. Review of the MIP images confirms the above findings CTA HEAD FINDINGS Anterior circulation: Petrous segments widely patent bilaterally. Scattered atheromatous plaque throughout the carotid siphons with relatively mild multifocal stenosis. A1 segments  patent bilaterally. Normal anterior communicating artery complex. Anterior cerebral arteries widely patent proximally. Short-segment severe distal right ACA stenosis noted (series 5, image 62). ACAs otherwise widely patent to their distal aspects. No M1 stenosis or occlusion. Normal MCA bifurcations. Distal MCA branches well perfused and symmetric. Posterior circulation: Dominant right vertebral artery patent to the vertebrobasilar junction without stenosis. Right PICA not visualized. Hypoplastic left vertebral artery patent to the vertebrobasilar junction as well. Left PICA patent. Basilar patent to its distal aspect without stenosis. Superior cerebral arteries patent bilaterally. Left PCA supplied via the basilar. Right PCA supplied via a hypoplastic right P1 segment and robust right posterior communicating artery. Both PCAs well perfused to their distal aspects without stenosis. Venous sinuses: Patent. Anatomic variants: Predominant fetal type origin of the right PCA. No intracranial aneurysm. Review of the MIP images confirms the above findings CT Brain Perfusion Findings: ASPECTS: 10 CBF (<30%) Volume: 53mL Perfusion (Tmax>6.0s) volume: 74mL Mismatch Volume: 70mL Infarction Location:Negative CT perfusion for acute core infarct or other perfusion deficit. IMPRESSION: CTA HEAD AND NECK IMPRESSION: 1. Negative CTA for emergent large vessel occlusion. 2. 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  3. Single short-segment severe distal right ACA stenosis as above. 4. Additional moderate atheromatous disease about the major arterial vasculature of the head and neck as above. No other hemodynamically significant or correctable stenosis. CT PERFUSION IMPRESSION: Negative CT perfusion. No evidence for acute core infarct or other perfusion deficit. CT CERVICAL SPINE IMPRESSION: No acute traumatic injury within the cervical spine. These results were communicated to Dr. Wilford Corner at approximately 9:00 pmon 6/6/2021by text page via the [facility] messaging system. Electronically Signed   By: Rise Mu M.D.   On: 10/01/2019 21:50   CT HEAD CODE STROKE WO CONTRAST  Result Date: 10/01/2019 CLINICAL DATA:  Code stroke. Initial evaluation for acute ataxia, left-sided weakness, altered mental status. EXAM: CT HEAD WITHOUT CONTRAST TECHNIQUE: Contiguous axial images were obtained from the base of the skull through the vertex without intravenous contrast. COMPARISON:  None available. FINDINGS: Brain: Generalized age-related cerebral atrophy. Patchy and confluent hypodensity throughout the periventricular deep white matter both cerebral hemispheres, most consistent with chronic small vessel ischemic disease, advanced in nature. Chronic small microvascular ischemic changes noted involving the deep gray nuclei and pons as well. Focal encephalomalacia within the right parieto-occipital region compatible with a chronic ischemic infarct. No acute intracranial hemorrhage. No acute large vessel territory infarct. No mass lesion, midline shift or mass effect. No hydrocephalus or extra-axial fluid collection. Vascular: No hyperdense vessel. Scattered vascular calcifications noted within the carotid siphons. Skull: Scalp soft tissues demonstrate no acute finding. Calvarium intact. Sinuses/Orbits: Globes and orbital soft tissues within normal limits. Left maxillary sinus retention cyst noted. Scattered mucosal thickening noted  throughout the sphenoid ethmoidal sinuses, with superimposed air-fluid level within the right sphenoid sinus. Trace left mastoid effusion. Other: None. ASPECTS [facility] Stroke Program Early CT Score) - Ganglionic level infarction (caudate, lentiform nuclei, internal capsule, insula, M1-M3 cortex): 7 - Supraganglionic infarction (M4-M6 cortex): 3 Total score (0-10 with 10 being normal): 10 IMPRESSION: 1. No acute intracranial infarct or other abnormality. 2. ASPECTS is 10. 3. Chronic right parieto-occipital infarct. 4. Underlying age-related cerebral atrophy with advanced chronic microvascular ischemic disease. Critical Value/emergent results were called by telephone at the time of interpretation on 10/01/2019 at 7:57 pm to provider Dr. Wilford Corner, Who verbally acknowledged these results. Electronically Signed   By: Rise Mu  M.D.   On: 10/01/2019 20:10    Procedures .Critical Care Performed by: Claude MangesSoto, Leilynn Pilat, PA-C Authorized by: Claude MangesSoto, Kailoni Vahle, PA-C   Critical care provider statement:    Critical care time (minutes):  35   Critical care start time:  10/01/2019 9:00 PM   Critical care end time:  10/01/2019 11:11 PM   Critical care time was exclusive of:  Separately billable procedures and treating other patients   Critical care was necessary to treat or prevent imminent or life-threatening deterioration of the following conditions:  CNS failure or compromise   Critical care was time spent personally by me on the following activities:  Blood draw for specimens, development of treatment plan with patient or surrogate, discussions with consultants, evaluation of patient's response to treatment, examination of patient, obtaining history from patient or surrogate, ordering and performing treatments and interventions, ordering and review of laboratory studies, ordering and review of radiographic studies, pulse oximetry, re-evaluation of patient's condition and review of old charts   (including critical care  time)  Medications Ordered in ED Medications  potassium chloride 10 mEq in 100 mL IVPB (10 mEq Intravenous New Bag/Given 10/01/19 2209)  sodium chloride flush (NS) 0.9 % injection 3 mL (3 mLs Intravenous Given 10/01/19 2107)  iohexol (OMNIPAQUE) 350 MG/ML injection 100 mL (100 mLs Intravenous Contrast Given 10/01/19 2033)    ED Course  I have reviewed the triage vital signs and the nursing notes.  Pertinent labs & imaging results that were available during my care of the patient were reviewed by me and considered in my medical decision making (see chart for details).  Clinical Course as of Oct 01 2326  Wynelle LinkSun Oct 01, 2019  2317 IV replacement has been ordered  Potassium(!!): 2.3 [JS]    Clinical Course User Index [JS] Claude MangesSoto, Antavious Spanos, PA-C   MDM Rules/Calculators/A&P  Patient arrived to the ED status post fall, along with left arm weakness and slurred speech.  Code stroke was activated by EMS.  Collateral information obtained from daughter who noted patient was last checked around 230 or 3:00 by the family.  She called on them around 5 PM and said that she had fallen.Patient's daughter was not with patient, she happened to be at her mother's house.   10:21 PM spoke to Dr. Jerrell BelfastAurora of neurology who recommended admission for further stroke work-up.  CT Head showed: 1. Negative CTA for emergent large vessel occlusion.  2. Atheromatous plaque about the proximal right ICA with associated  short-segment stenosis of up to 50% by NASCET criteria.  3. Single short-segment severe distal right ACA stenosis as above.  4. Additional moderate atheromatous disease about the major arterial  vasculature of the head and neck as above. No other hemodynamically  significant or correctable stenosis.   Her labs were reviewed by me, CMP remarkable for severe hypokalemia, unknown source at this time.  She is getting IV replacement of potassium.  Mild elevation in creatinine.  LFTs are within normal limits.  CBC  without any leukocytosis, hemoglobin is within normal limits.  10:38 PM spoke to daughter at the bedside who reported patient had a fall about a month ago, she has not been the same since his fall.  She is noted her to be more weak, confused, had changes in her until status.  She admittedly was to follow-up with her primary care physician, has not done so yet.  According to daughter, patient has been under a lot of stress, her mother recently passed in the  funeral supposed to be tomorrow.  Patient has had increase in falls in the past couple of months.  According to the daughter he has noted her to be more forgetful, constant repetition of questions have been happening at home.  Daughter is aware that patient is outside the window for TPA, she is not a candidate for thrombectomy at this time.  Patient will need admission into the hospital service for further stroke work-up at this time.  Spoke to Dr. Loney Loh hospitalist who will admit patient for further stroke work-up.  Portions of this note were generated with Scientist, clinical (histocompatibility and immunogenetics). Dictation errors may occur despite best attempts at proofreading.  Final Clinical Impression(s) / ED Diagnoses Final diagnoses:  Left-sided weakness  Acute right MCA stroke [facility])    Rx / DC Orders ED Discharge Orders    None       Claude Manges, Cordelia Poche 10/01/19 2328    Charlynne Pander, MD 10/02/19 1609    Charlynne Pander, MD 10/02/19 [phone]

## 2019-10-01 NOTE — ED Triage Notes (Signed)
Pt BIB [facility] code stroke. Fell on Wednesday, [place] 1430 today. L sided weakness/neglect. Pt tearful

## 2019-10-02 ENCOUNTER — Inpatient Hospital Stay (HOSPITAL_COMMUNITY): Payer: Medicare Other

## 2019-10-02 DIAGNOSIS — I63511 Cerebral infarction due to unspecified occlusion or stenosis of right middle cerebral artery: Secondary | ICD-10-CM

## 2019-10-02 DIAGNOSIS — I6389 Other cerebral infarction: Secondary | ICD-10-CM

## 2019-10-02 DIAGNOSIS — E876 Hypokalemia: Secondary | ICD-10-CM

## 2019-10-02 DIAGNOSIS — G2581 Restless legs syndrome: Secondary | ICD-10-CM

## 2019-10-02 DIAGNOSIS — F4321 Adjustment disorder with depressed mood: Secondary | ICD-10-CM

## 2019-10-02 DIAGNOSIS — E860 Dehydration: Secondary | ICD-10-CM

## 2019-10-02 LAB — BASIC METABOLIC PANEL
Anion gap: 10 (ref 5–15)
BUN: 8 mg/dL (ref 8–23)
CO2: 23 mmol/L (ref 22–32)
Calcium: 8.5 mg/dL — ABNORMAL LOW (ref 8.9–10.3)
Chloride: 107 mmol/L (ref 98–111)
Creatinine, Ser: 0.71 mg/dL (ref 0.44–1.00)
GFR calc Af Amer: >60 mL/min (ref >60)
GFR calc non Af Amer: >60 mL/min (ref >60)
Glucose, Bld: 107 mg/dL — ABNORMAL HIGH (ref 70–99)
Potassium: 3.5 mmol/L (ref 3.5–5.1)
Sodium: 140 mmol/L (ref 135–145)

## 2019-10-02 LAB — HEMOGLOBIN A1C
Hgb A1c MFr Bld: 5.7 % — ABNORMAL HIGH (ref 4.8–5.6)
Mean Plasma Glucose: 116.89 mg/dL

## 2019-10-02 LAB — ECHOCARDIOGRAM COMPLETE
Height: 61 in
Weight: 3731.95 oz

## 2019-10-02 LAB — MAGNESIUM: Magnesium: 1.7 mg/dL (ref 1.7–2.4)

## 2019-10-02 LAB — LIPID PANEL
Cholesterol: 176 mg/dL (ref 0–200)
HDL: 38 mg/dL — ABNORMAL LOW (ref >40)
LDL Cholesterol: 118 mg/dL — ABNORMAL HIGH (ref 0–99)
Total CHOL/HDL Ratio: 4.6 RATIO
Triglycerides: 102 mg/dL (ref <150)
VLDL: 20 mg/dL (ref 0–40)

## 2019-10-02 IMAGING — MR MR HEAD W/O CM
12 of 13 series · 44 of 48 positions shown · non-contrast
Comparison: Comparison made with prior CTs from [DATE].

CLINICAL DATA: Follow-up examination for acute stroke.

EXAM:
MRI HEAD WITHOUT CONTRAST
TECHNIQUE: Multiplanar, multiecho pulse sequences of the brain and surrounding
structures were obtained without intravenous contrast.

[Series 5: DWI · axial · 3.0mm · 0.88mm/px · z∈[-123,+22]mm · 8 of 100 slices shown (1 of 4)]
[im 1/100]
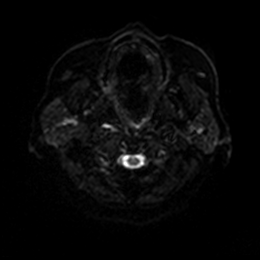
[im 15/100]
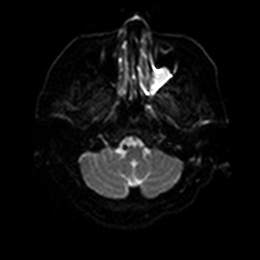
[im 29/100]
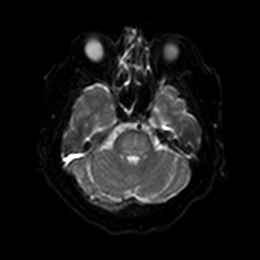
[im 43/100]
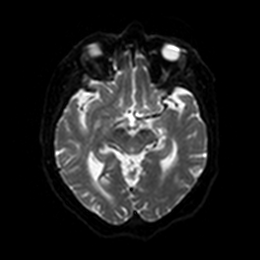
[im 57/100]
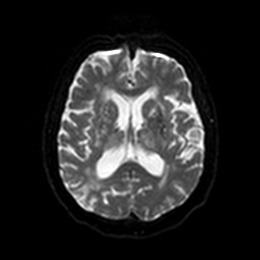
[im 71/100]
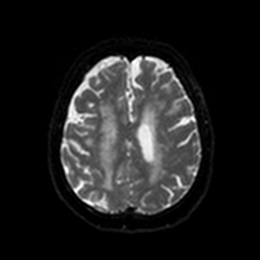
[im 85/100]
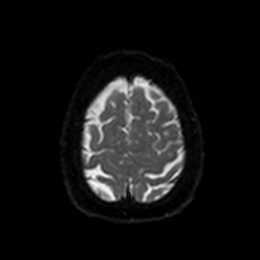
[im 100/100]
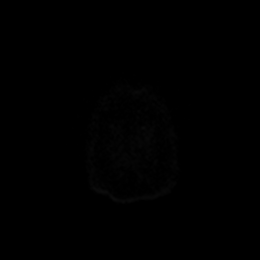

[Series 6: DWI · axial · 3.0mm · 0.88mm/px · z∈[-123,+22]mm · 4 of 50 slices shown (2 of 4)]
[im 1/50]
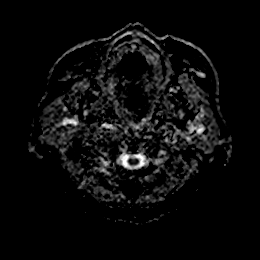
[im 17/50]
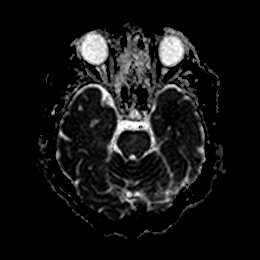
[im 33/50]
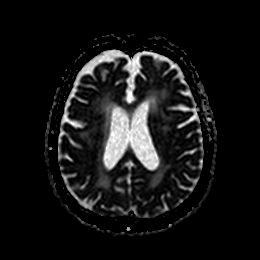
[im 50/50]
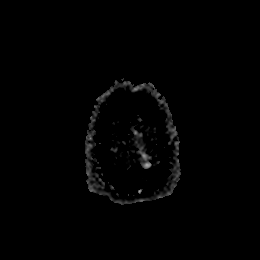

[Series 7: DWI · coronal · 4.0mm · 0.88mm/px · 5 of 64 slices shown (3 of 4)]
[im 1/64]
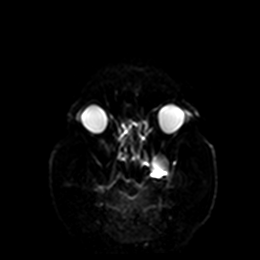
[im 16/64]
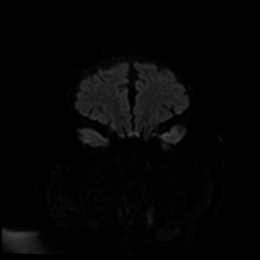
[im 32/64]
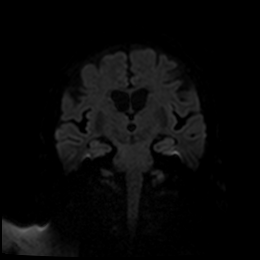
[im 48/64]
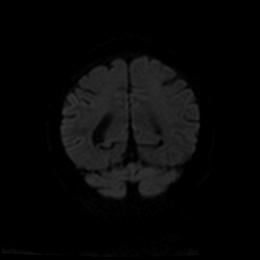
[im 64/64]
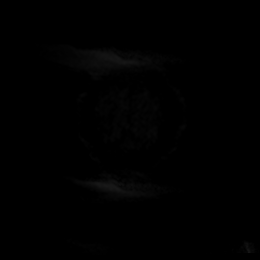

[Series 8: DWI · coronal · 4.0mm · 0.88mm/px · 3 of 32 slices shown (4 of 4)]
[im 1/32]
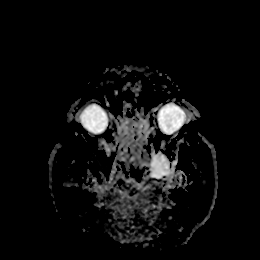
[im 16/32]
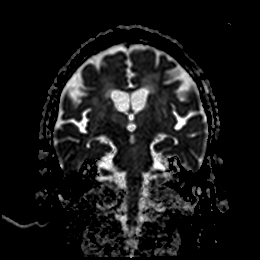
[im 32/32]
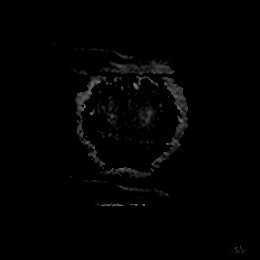

[Series 9: FLAIR · axial · 5.0mm · 0.90mm/px · z∈[-125,+28]mm · 2 of 27 slices shown]
[im 1/27]
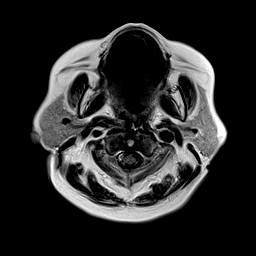
[im 27/27]
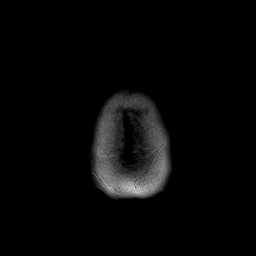

[Series 10: T2 · axial · 5.0mm · 0.72mm/px · z∈[-125,+28]mm · 2 of 27 slices shown (1 of 2)]
[im 1/27]
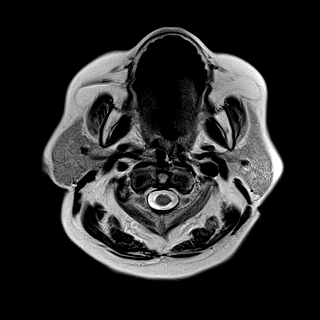
[im 27/27]
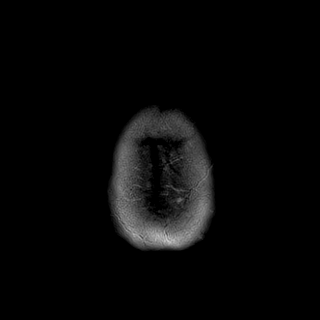

[Series 11: T1 · sagittal · 5.0mm · 0.72mm/px · 2 of 25 slices shown]
[im 1/25]
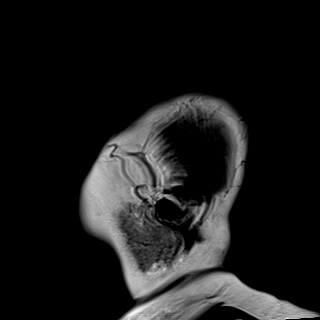
[im 25/25]
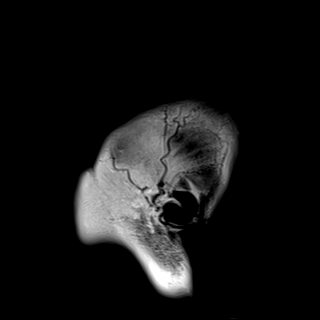

[Series 12: mag_images · axial · 3.0mm · 0.90mm/px · z∈[-121,+29]mm · 4 of 52 slices shown]
[im 1/52]
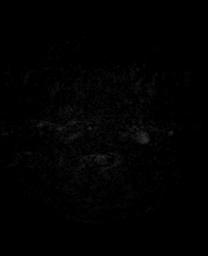
[im 18/52]
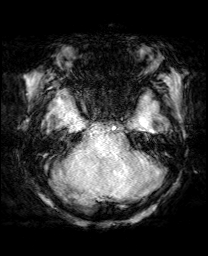
[im 35/52]
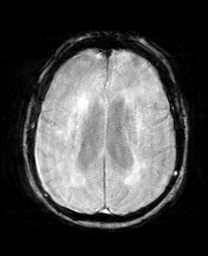
[im 52/52]
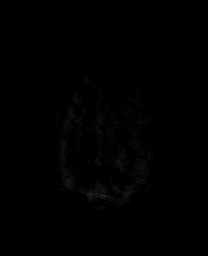

[Series 13: pha_images · axial · 3.0mm · 0.90mm/px · z∈[-118,+26]mm · 4 of 50 slices shown]
[im 1/50]
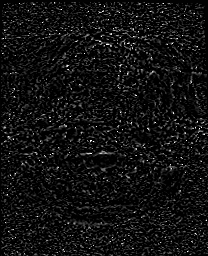
[im 17/50]
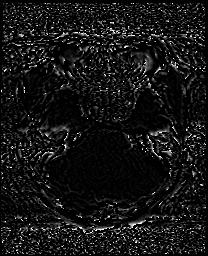
[im 33/50]
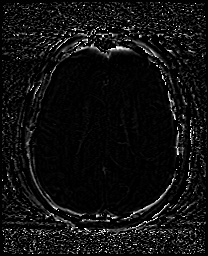
[im 50/50]
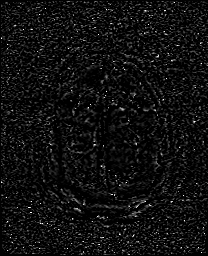

[Series 14: swi_images · axial · 3.0mm · 0.90mm/px · z∈[-121,+29]mm · 4 of 52 slices shown]
[im 1/52]
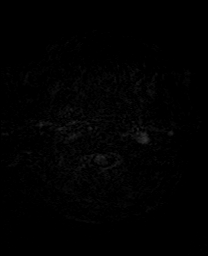
[im 18/52]
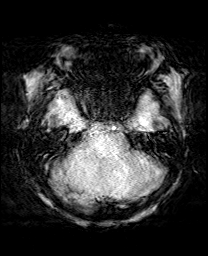
[im 35/52]
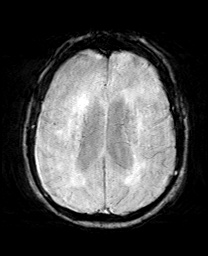
[im 52/52]
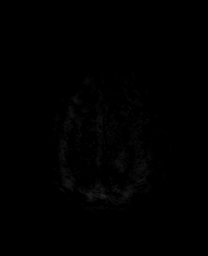

[Series 15: mip_images(sw) · axial · 24.0mm · 0.90mm/px · z∈[-111,+19]mm · 4 of 45 slices shown]
[im 1/45]
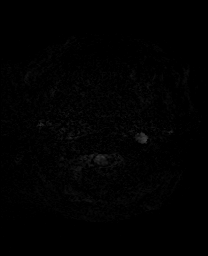
[im 15/45]
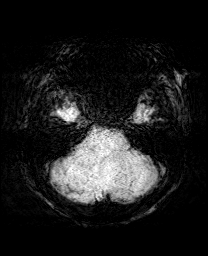
[im 30/45]
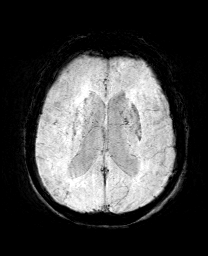
[im 45/45]
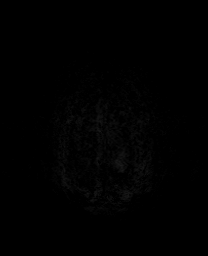

[Series 17: T2 · coronal · 5.0mm · 0.34mm/px · 2 of 29 slices shown (2 of 2)]
[im 1/29]
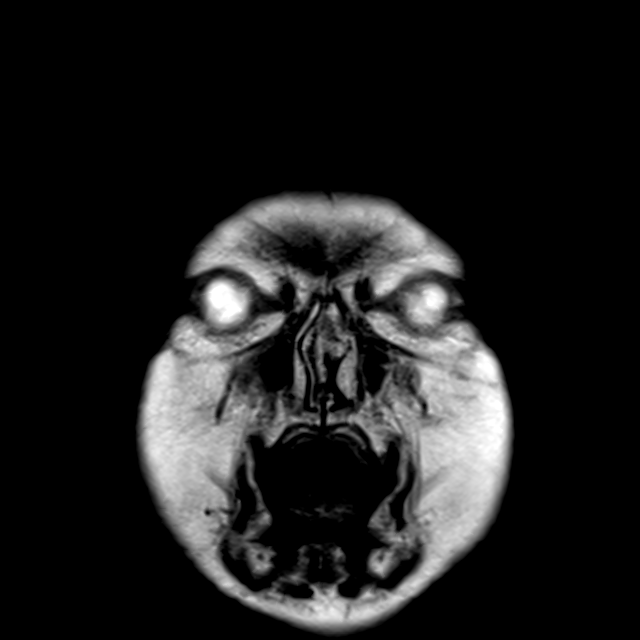
[im 29/29]
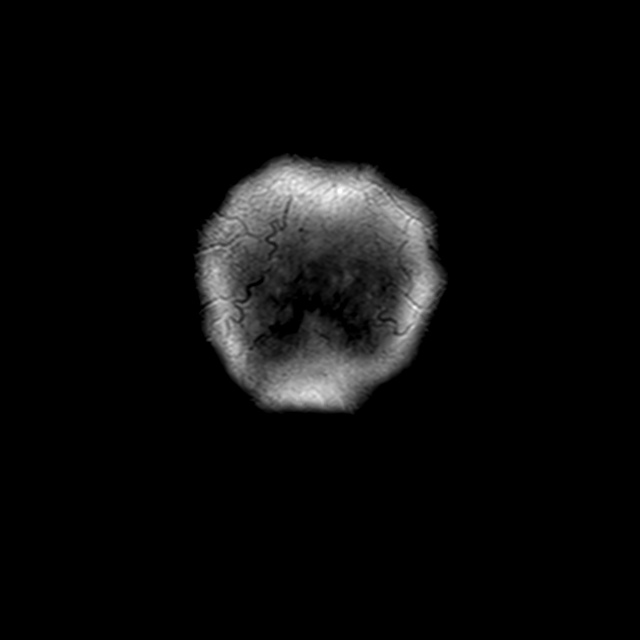

[44 of 48 positions shown; findings below may reference images not displayed]

FINDINGS: Brain: Diffuse prominence of the CSF containing spaces compatible
with generalized age-related cerebral atrophy. Patchy and confluent
T2/FLAIR hyperintensity within the periventricular deep white matter
both cerebral hemispheres most consistent with chronic small vessel
ischemic disease. Patchy involvement of the deep gray nuclei and
pons. Overall, appearance is moderate to advanced in nature.
Superimposed remote cortical infarct at the right temporal occipital
region.

13 mm focus of diffusion abnormality at the dorsal right thalamus
compatible with an acute ischemic infarct (series 5, image 76).
Associated signal loss on corresponding ADC map (series 6, image
25). No associated hemorrhage or mass effect. No other evidence for
acute or subacute ischemia. Gray-white matter differentiation
otherwise maintained. No other areas of remote cortical infarction.
No evidence for acute intracranial hemorrhage.

No mass lesion, midline shift or mass effect. No hydrocephalus or
extra-axial fluid collection. No made of a partially empty sella.
Midline structures intact.

Vascular: Major intracranial vascular flow voids are maintained.

Skull and upper cervical spine: Craniocervical junction within
normal limits. Bone marrow signal intensity normal. No scalp soft
tissue abnormality.

Sinuses/Orbits: Patient status post bilateral ocular lens
replacement. Globes and orbital soft tissues demonstrate no acute
finding. Scattered mucosal thickening noted throughout the paranasal
sinuses with superimposed left maxillary sinus retention cyst. Trace
left mastoid effusion noted, of doubtful significance. Inner ear
structures grossly normal.

Other: None.
IMPRESSION: 1. 13 mm acute ischemic nonhemorrhagic right thalamic infarct.
2. Chronic cortical infarct involving the right temporal occipital
region.
3. Underlying age-related cerebral atrophy with moderate to advanced
chronic microvascular ischemic disease.

## 2019-10-02 MED ORDER — CLOPIDOGREL BISULFATE 75 MG PO TABS: 75.0000 mg | ORAL_TABLET | Freq: Every day | ORAL | Status: DC

## 2019-10-02 MED ORDER — POTASSIUM CHLORIDE CRYS ER 20 MEQ PO TBCR
20.0000 meq | EXTENDED_RELEASE_TABLET | Freq: Two times a day (BID) | ORAL | Status: DC
  Administered 2019-10-02 – 2019-10-03 (×3): 20 meq via ORAL
  Filled 2019-10-02 (×3): qty 1

## 2019-10-02 MED ORDER — CLOPIDOGREL BISULFATE 75 MG PO TABS
75.0000 mg | ORAL_TABLET | Freq: Every day | ORAL | Status: DC
  Administered 2019-10-02 – 2019-10-03 (×2): 75 mg via ORAL
  Filled 2019-10-02 (×2): qty 1

## 2019-10-02 MED ORDER — ASPIRIN EC 81 MG PO TBEC: 81.0000 mg | DELAYED_RELEASE_TABLET | Freq: Every day | ORAL | Status: DC

## 2019-10-02 MED ORDER — ASPIRIN EC 81 MG PO TBEC
81.0000 mg | DELAYED_RELEASE_TABLET | Freq: Every day | ORAL | Status: DC
  Administered 2019-10-02 – 2019-10-03 (×2): 81 mg via ORAL
  Filled 2019-10-02 (×2): qty 1

## 2019-10-02 NOTE — Progress Notes (Signed)
Echocardiogram 2D Echocardiogram has been performed.  [name] Good [name] Good 10/02/2019, 3:32 PM   Dr. Rennis Golden notified of critical findings at 3:30

## 2019-10-02 NOTE — ED Notes (Signed)
Lunch Tray Ordered @ 1040. 

## 2019-10-02 NOTE — Progress Notes (Signed)
New Admission Note:  Arrival Method: pt arrived via stretcher from ED Mental Orientation: pt alert and orient to person, place time and situation Telemetry: yes on [address] 31 Assessment: Completed Skin: clean and dry IV: left FA, left hand- saline locked Pain: c/o pian in bilateral lower extremities. PRN Requip to be administered. Safety Measures: Safety Fall Prevention Plan was given, discussed. Admission: Completed 3W: Patient has been orientated to the room, unit and the staff. Family: pt has daughter and son-in-law at bedside that are supportive and pleasant.  Orders have been reviewed and implemented. Will continue to monitor the patient. Call light has been placed within reach and bed alarm has been activated.   Kathrynn Humble ,RN

## 2019-10-02 NOTE — Progress Notes (Signed)
[facility] PROGRESS NOTE    Progress Note  [name] Good  WUJ:[id number] DOB: February 03, 1951 DOA: 10/01/2019 PCP: Kaleen MaskElkins, Wilson Oliver, MD     Brief Narrative:   [name] Good is an 69 y.o. female past medical history significant for CAD, depression GERD RLS presents to the ED as a code stroke for acute left-sided weakness they called her house and she was on the floor they found her to have a slurred speech and left-sided weakness seen in outside ED by neurology felt to be outside of TPA window, CT of the head was negative for acute CVA, CTA of the head and neck was negative for LVO, basic metabolic panel showed a potassium of 2.3 without acute EKG changes.  Assessment/Plan:   Acute CVA (cerebrovascular accident) (HCC) HgbA1c 5.7, fasting lipid panel HDL less than 40 LDL greater than 70. MRI, MRA of the brain without contrast showed a right thalamic infarct. CT angio of the head and neck showed proximal ICA short segment stenosis of about 50%, single short segment severe right ECA stenosis PT, OT, Speech consult pending Transthoracic Echo,   pending Start patient on ASA 81mg  daily and plavix 75mg  daily  Atorvastatin 80 mg BP goal: permissive HTN upto 220/120 mmHg Telemetry monitoring Passive swallowing evaluation, allow regular diet.  Hypokalemia: She passed a swallowing evaluation we will give her a diet, replete potassium orally. Yes I know she did get 6 bags IV of potassium needs to be closer to 4.  Acute kidney injury: Likely prerenal azotemia, start on gentle IV fluid hydration recheck basic metabolic panel in the morning.  Grief reaction She has been counsel currently with labile emotion.  RLS (restless legs syndrome) Continue Requip.    DVT prophylaxis: lovenox Family Communication:none Status is: Inpatient  Remains inpatient appropriate because:Hemodynamically unstable   Dispo: The patient is from: Home              Anticipated d/c is to: SNF     Anticipated d/c date is: 2 days              Patient currently is not medically stable to d/c.        Code Status:     Code Status Orders  (From admission, onward)         Start     Ordered   10/01/19 2329  Full code  Continuous     10/01/19 2330        Code Status History    Date Active Date Inactive Code Status Order ID Comments User Context   10/16/2017 0920 10/20/2017 0025 Full Code [id number]  Lupita LeashMcQuaid, Douglas B, MD Inpatient   10/25/2015 1543 10/28/2015 1535 Full Code [id number]  Kathryne HitchBlackman, Christopher Y, MD Inpatient   Advance Care Planning Activity        IV Access:    Peripheral IV   Procedures and diagnostic studies:   CT Code Stroke CTA Head W/WO contrast  Result Date: 10/01/2019 CLINICAL DATA:  Initial evaluation for acute stroke, left-sided weakness, altered mental status. EXAM: CT ANGIOGRAPHY HEAD AND NECK CT PERFUSION BRAIN TECHNIQUE: Multidetector CT imaging of the head and neck was performed using the standard protocol during bolus administration of intravenous contrast. Multiplanar CT image reconstructions and MIPs were obtained to evaluate the vascular anatomy. Carotid stenosis measurements (when applicable) are obtained utilizing NASCET criteria, using the distal internal carotid diameter as the denominator. Multiphase CT imaging of the brain was performed following IV bolus contrast injection. Subsequent parametric perfusion  maps were calculated using RAPID software. CONTRAST:  OMNIPAQUE IOHEXOL 350 MG/ML SOLN COMPARISON:  Comparison made with prior noncontrast head CT from earlier same day. FINDINGS: CTA NECK FINDINGS Aortic arch: Visualized aortic arch of normal caliber with normal branch pattern. Moderate atheromatous plaque seen about the arch and origin of the great vessels without hemodynamically significant stenosis. Note made of soft plaque protruding into the lumen of the distal arch/proximal intrathoracic aorta (series 5, image 307).  Visualized subclavian arteries widely patent. Right carotid system: Right common carotid artery patent from its origin to the bifurcation without flow-limiting stenosis. Scattered eccentric plaque about the right bifurcation without significant stenosis. Just distally within the proximal right ICA, additional focal plaque is seen with associated short-segment stenosis of up to 50% by NASCET criteria (series 5, image 169). Right ICA tortuous and medialized into the retropharyngeal space. Right ICA otherwise widely patent distally to the skull base without stenosis, dissection or occlusion. Left carotid system: Left common carotid artery patent from its origin to the bifurcation without stenosis. Scattered eccentric mixed plaque about the left bifurcation/proximal left ICA without hemodynamically significant stenosis. Proximal left ICA tortuous and medialized into the retropharyngeal space. Left ICA widely patent distally to the skull base without stenosis, dissection or occlusion. Vertebral arteries: Both vertebral arteries arise from the subclavian arteries. Strongly dominant right vertebral artery with a markedly hypoplastic left vertebral artery noted. Dominant right vertebral artery patent within the neck without stenosis, dissection, or occlusion. Markedly diminutive left vertebral artery patent as well without appreciable stenosis or other vascular abnormality. Skeleton: Alignment: Straightening of the normal cervical lordosis. No listhesis or malalignment. Skull base and vertebrae: Skull base intact. Normal C1-2 articulations are preserved in the dens is intact. Vertebral body heights maintained. No acute fracture. Soft tissues and spinal canal: Soft tissues of the neck demonstrate no acute finding. No abnormal prevertebral edema. Spinal canal within normal limits. Disc levels: Mild multilevel cervical spondylosis without significant stenosis, most pronounced at C6-7. Upper chest: Visualized upper chest  demonstrates no acute finding. Partially visualized lung apices are clear. Other: None. Other neck: No other acute soft tissue abnormality within the neck. No mass lesion or adenopathy. Subcentimeter calcification noted within the left lobe of thyroid. Upper chest: Visualized upper chest demonstrates no acute finding. Partially visualized lungs are grossly clear. Review of the MIP images confirms the above findings CTA HEAD FINDINGS Anterior circulation: Petrous segments widely patent bilaterally. Scattered atheromatous plaque throughout the carotid siphons with relatively mild multifocal stenosis. A1 segments patent bilaterally. Normal anterior communicating artery complex. Anterior cerebral arteries widely patent proximally. Short-segment severe distal right ACA stenosis noted (series 5, image 62). ACAs otherwise widely patent to their distal aspects. No M1 stenosis or occlusion. Normal MCA bifurcations. Distal MCA branches well perfused and symmetric. Posterior circulation: Dominant right vertebral artery patent to the vertebrobasilar junction without stenosis. Right PICA not visualized. Hypoplastic left vertebral artery patent to the vertebrobasilar junction as well. Left PICA patent. Basilar patent to its distal aspect without stenosis. Superior cerebral arteries patent bilaterally. Left PCA supplied via the basilar. Right PCA supplied via a hypoplastic right P1 segment and robust right posterior communicating artery. Both PCAs well perfused to their distal aspects without stenosis. Venous sinuses: Patent. Anatomic variants: Predominant fetal type origin of the right PCA. No intracranial aneurysm. Review of the MIP images confirms the above findings CT Brain Perfusion Findings: ASPECTS: 10 CBF (<30%) Volume: 0mL Perfusion (Tmax>6.0s) volume: 0mL Mismatch Volume: 0mL Infarction Location:Negative CT perfusion for  acute core infarct or other perfusion deficit. IMPRESSION: CTA HEAD AND NECK IMPRESSION: 1. Negative  CTA for emergent large vessel occlusion. 2. 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 3. Single short-segment severe distal right ACA stenosis as above. 4. Additional moderate atheromatous disease about the major arterial vasculature of the head and neck as above. No other hemodynamically significant or correctable stenosis. CT PERFUSION IMPRESSION: Negative CT perfusion. No evidence for acute core infarct or other perfusion deficit. CT CERVICAL SPINE IMPRESSION: No acute traumatic injury within the cervical spine. These results were communicated to Dr. Wilford Corner at approximately 9:00 pmon 6/6/2021by text page via the [facility] messaging system. Electronically Signed   By: Rise Mu M.D.   On: 10/01/2019 21:50   CT Code Stroke CTA Neck W/WO contrast  Result Date: 10/01/2019 CLINICAL DATA:  Initial evaluation for acute stroke, left-sided weakness, altered mental status. EXAM: CT ANGIOGRAPHY HEAD AND NECK CT PERFUSION BRAIN TECHNIQUE: Multidetector CT imaging of the head and neck was performed using the standard protocol during bolus administration of intravenous contrast. Multiplanar CT image reconstructions and MIPs were obtained to evaluate the vascular anatomy. Carotid stenosis measurements (when applicable) are obtained utilizing NASCET criteria, using the distal internal carotid diameter as the denominator. Multiphase CT imaging of the brain was performed following IV bolus contrast injection. Subsequent parametric perfusion maps were calculated using RAPID software. CONTRAST:  OMNIPAQUE IOHEXOL 350 MG/ML SOLN COMPARISON:  Comparison made with prior noncontrast head CT from earlier same day. FINDINGS: CTA NECK FINDINGS Aortic arch: Visualized aortic arch of normal caliber with normal branch pattern. Moderate atheromatous plaque seen about the arch and origin of the great vessels without hemodynamically significant  stenosis. Note made of soft plaque protruding into the lumen of the distal arch/proximal intrathoracic aorta (series 5, image 307). Visualized subclavian arteries widely patent. Right carotid system: Right common carotid artery patent from its origin to the bifurcation without flow-limiting stenosis. Scattered eccentric plaque about the right bifurcation without significant stenosis. Just distally within the proximal right ICA, additional focal plaque is seen with associated short-segment stenosis of up to 50% by NASCET criteria (series 5, image 169). Right ICA tortuous and medialized into the retropharyngeal space. Right ICA otherwise widely patent distally to the skull base without stenosis, dissection or occlusion. Left carotid system: Left common carotid artery patent from its origin to the bifurcation without stenosis. Scattered eccentric mixed plaque about the left bifurcation/proximal left ICA without hemodynamically significant stenosis. Proximal left ICA tortuous and medialized into the retropharyngeal space. Left ICA widely patent distally to the skull base without stenosis, dissection or occlusion. Vertebral arteries: Both vertebral arteries arise from the subclavian arteries. Strongly dominant right vertebral artery with a markedly hypoplastic left vertebral artery noted. Dominant right vertebral artery patent within the neck without stenosis, dissection, or occlusion. Markedly diminutive left vertebral artery patent as well without appreciable stenosis or other vascular abnormality. Skeleton: Alignment: Straightening of the normal cervical lordosis. No listhesis or malalignment. Skull base and vertebrae: Skull base intact. Normal C1-2 articulations are preserved in the dens is intact. Vertebral body heights maintained. No acute fracture. Soft tissues and spinal canal: Soft tissues of the neck demonstrate no acute finding. No abnormal prevertebral edema. Spinal canal within normal limits. Disc levels:  Mild multilevel cervical spondylosis without significant stenosis, most pronounced at C6-7. Upper chest: Visualized upper chest demonstrates no acute finding. Partially visualized lung apices are clear. Other: None. Other neck:  No other acute soft tissue abnormality within the neck. No mass lesion or adenopathy. Subcentimeter calcification noted within the left lobe of thyroid. Upper chest: Visualized upper chest demonstrates no acute finding. Partially visualized lungs are grossly clear. Review of the MIP images confirms the above findings CTA HEAD FINDINGS Anterior circulation: Petrous segments widely patent bilaterally. Scattered atheromatous plaque throughout the carotid siphons with relatively mild multifocal stenosis. A1 segments patent bilaterally. Normal anterior communicating artery complex. Anterior cerebral arteries widely patent proximally. Short-segment severe distal right ACA stenosis noted (series 5, image 62). ACAs otherwise widely patent to their distal aspects. No M1 stenosis or occlusion. Normal MCA bifurcations. Distal MCA branches well perfused and symmetric. Posterior circulation: Dominant right vertebral artery patent to the vertebrobasilar junction without stenosis. Right PICA not visualized. Hypoplastic left vertebral artery patent to the vertebrobasilar junction as well. Left PICA patent. Basilar patent to its distal aspect without stenosis. Superior cerebral arteries patent bilaterally. Left PCA supplied via the basilar. Right PCA supplied via a hypoplastic right P1 segment and robust right posterior communicating artery. Both PCAs well perfused to their distal aspects without stenosis. Venous sinuses: Patent. Anatomic variants: Predominant fetal type origin of the right PCA. No intracranial aneurysm. Review of the MIP images confirms the above findings CT Brain Perfusion Findings: ASPECTS: 10 CBF (<30%) Volume: 0mL Perfusion (Tmax>6.0s) volume: 0mL Mismatch Volume: 0mL Infarction  Location:Negative CT perfusion for acute core infarct or other perfusion deficit. IMPRESSION: CTA HEAD AND NECK IMPRESSION: 1. Negative CTA for emergent large vessel occlusion. 2. 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 3. Single short-segment severe distal right ACA stenosis as above. 4. Additional moderate atheromatous disease about the major arterial vasculature of the head and neck as above. No other hemodynamically significant or correctable stenosis. CT PERFUSION IMPRESSION: Negative CT perfusion. No evidence for acute core infarct or other perfusion deficit. CT CERVICAL SPINE IMPRESSION: No acute traumatic injury within the cervical spine. These results were communicated to Dr. Wilford Corner at approximately 9:00 pmon 6/6/2021by text page via the [facility] messaging system. Electronically Signed   By: Rise Mu M.D.   On: 10/01/2019 21:50   MR BRAIN WO CONTRAST  Result Date: 10/02/2019 CLINICAL DATA:  Follow-up examination for acute stroke. EXAM: MRI HEAD WITHOUT CONTRAST TECHNIQUE: Multiplanar, multiecho pulse sequences of the brain and surrounding structures were obtained without intravenous contrast. COMPARISON:  Comparison made with prior CTs from 10/01/2019. FINDINGS: Brain: Diffuse prominence of the CSF containing spaces compatible with generalized age-related cerebral atrophy. Patchy and confluent T2/FLAIR hyperintensity within the periventricular deep white matter both cerebral hemispheres most consistent with chronic small vessel ischemic disease. Patchy involvement of the deep gray nuclei and pons. Overall, appearance is moderate to advanced in nature. Superimposed remote cortical infarct at the right temporal occipital region. 13 mm focus of diffusion abnormality at the dorsal right thalamus compatible with an acute ischemic infarct (series 5, image 76). Associated signal loss on corresponding ADC map (series 6,  image 25). No associated hemorrhage or mass effect. No other evidence for acute or subacute ischemia. Gray-white matter differentiation otherwise maintained. No other areas of remote cortical infarction. No evidence for acute intracranial hemorrhage. No mass lesion, midline shift or mass effect. No hydrocephalus or extra-axial fluid collection. No made of a partially empty sella. Midline structures intact. Vascular: Major intracranial vascular flow voids are maintained. Skull and upper cervical spine: Craniocervical junction within normal limits. Bone marrow signal intensity normal.  No scalp soft tissue abnormality. Sinuses/Orbits: Patient status post bilateral ocular lens replacement. Globes and orbital soft tissues demonstrate no acute finding. Scattered mucosal thickening noted throughout the paranasal sinuses with superimposed left maxillary sinus retention cyst. Trace left mastoid effusion noted, of doubtful significance. Inner ear structures grossly normal. Other: None. IMPRESSION: 1. 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 2. 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 3. Underlying age-related cerebral atrophy with moderate to advanced chronic microvascular ischemic disease. Electronically Signed   By: Jeannine Boga M.D.   On: 10/02/2019 02:39   CT C-SPINE NO CHARGE  Result Date: 10/01/2019 CLINICAL DATA:  Initial evaluation for acute stroke, left-sided weakness, altered mental status. EXAM: CT ANGIOGRAPHY HEAD AND NECK CT PERFUSION BRAIN TECHNIQUE: Multidetector CT imaging of the head and neck was performed using the standard protocol during bolus administration of intravenous contrast. Multiplanar CT image reconstructions and MIPs were obtained to evaluate the vascular anatomy. Carotid stenosis measurements (when applicable) are obtained utilizing NASCET criteria, using the distal internal carotid diameter as the denominator. Multiphase CT imaging  of the brain was performed following IV bolus contrast injection. Subsequent parametric perfusion maps were calculated using RAPID software. CONTRAST:  176mL OMNIPAQUE IOHEXOL 350 MG/ML SOLN COMPARISON:  Comparison made with prior noncontrast head CT from earlier same day. FINDINGS: CTA NECK FINDINGS Aortic arch: Visualized aortic arch of normal caliber with normal branch pattern. Moderate atheromatous plaque seen about the arch and origin of the great vessels without hemodynamically significant stenosis. Note made of soft plaque protruding into the lumen of the distal arch/proximal intrathoracic aorta (series 5, image 307). Visualized subclavian arteries widely patent. Right carotid system: Right common carotid artery patent from its origin to the bifurcation without flow-limiting stenosis. Scattered eccentric plaque about the right bifurcation without significant stenosis. Just distally within the proximal right ICA, additional focal plaque is seen with associated short-segment stenosis of up to 50% by NASCET criteria (series 5, image 169). Right ICA tortuous and medialized into the retropharyngeal space. Right ICA otherwise widely patent distally to the skull base without stenosis, dissection or occlusion. Left carotid system: Left common carotid artery patent from its origin to the bifurcation without stenosis. Scattered eccentric mixed plaque about the left bifurcation/proximal left ICA without hemodynamically significant stenosis. Proximal left ICA tortuous and medialized into the retropharyngeal space. Left ICA widely patent distally to the skull base without stenosis, dissection or occlusion. Vertebral arteries: Both vertebral arteries arise from the subclavian arteries. Strongly dominant right vertebral artery with a markedly hypoplastic left vertebral artery noted. Dominant right vertebral artery patent within the neck without stenosis, dissection, or occlusion. Markedly diminutive left vertebral artery  patent as well without appreciable stenosis or other vascular abnormality. Skeleton: Alignment: Straightening of the normal cervical lordosis. No listhesis or malalignment. Skull base and vertebrae: Skull base intact. Normal C1-2 articulations are preserved in the dens is intact. Vertebral body heights maintained. No acute fracture. Soft tissues and spinal canal: Soft tissues of the neck demonstrate no acute finding. No abnormal prevertebral edema. Spinal canal within normal limits. Disc levels: Mild multilevel cervical spondylosis without significant stenosis, most pronounced at C6-7. Upper chest: Visualized upper chest demonstrates no acute finding. Partially visualized lung apices are clear. Other: None. Other neck: No other acute soft tissue abnormality within the neck. No mass lesion or adenopathy. Subcentimeter calcification noted within the left lobe of thyroid. Upper chest: Visualized upper chest demonstrates no acute finding. Partially visualized lungs are grossly clear. Review of  the MIP images confirms the above findings CTA HEAD FINDINGS Anterior circulation: Petrous segments widely patent bilaterally. Scattered atheromatous plaque throughout the carotid siphons with relatively mild multifocal stenosis. A1 segments patent bilaterally. Normal anterior communicating artery complex. Anterior cerebral arteries widely patent proximally. Short-segment severe distal right ACA stenosis noted (series 5, image 62). ACAs otherwise widely patent to their distal aspects. No M1 stenosis or occlusion. Normal MCA bifurcations. Distal MCA branches well perfused and symmetric. Posterior circulation: Dominant right vertebral artery patent to the vertebrobasilar junction without stenosis. Right PICA not visualized. Hypoplastic left vertebral artery patent to the vertebrobasilar junction as well. Left PICA patent. Basilar patent to its distal aspect without stenosis. Superior cerebral arteries patent bilaterally. Left PCA  supplied via the basilar. Right PCA supplied via a hypoplastic right P1 segment and robust right posterior communicating artery. Both PCAs well perfused to their distal aspects without stenosis. Venous sinuses: Patent. Anatomic variants: Predominant fetal type origin of the right PCA. No intracranial aneurysm. Review of the MIP images confirms the above findings CT Brain Perfusion Findings: ASPECTS: 10 CBF (<30%) Volume: 48mL Perfusion (Tmax>6.0s) volume: 51mL Mismatch Volume: 45mL Infarction Location:Negative CT perfusion for acute core infarct or other perfusion deficit. IMPRESSION: CTA HEAD AND NECK IMPRESSION: 1. Negative CTA for emergent large vessel occlusion. 2. 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 3. Single short-segment severe distal right ACA stenosis as above. 4. Additional moderate atheromatous disease about the major arterial vasculature of the head and neck as above. No other hemodynamically significant or correctable stenosis. CT PERFUSION IMPRESSION: Negative CT perfusion. No evidence for acute core infarct or other perfusion deficit. CT CERVICAL SPINE IMPRESSION: No acute traumatic injury within the cervical spine. These results were communicated to Dr. Wilford Corner at approximately 9:00 pmon 6/6/2021by text page via the [facility] messaging system. Electronically Signed   By: Rise Mu M.D.   On: 10/01/2019 21:50   CT Code Stroke Cerebral Perfusion with contrast  Result Date: 10/01/2019 CLINICAL DATA:  Initial evaluation for acute stroke, left-sided weakness, altered mental status. EXAM: CT ANGIOGRAPHY HEAD AND NECK CT PERFUSION BRAIN TECHNIQUE: Multidetector CT imaging of the head and neck was performed using the standard protocol during bolus administration of intravenous contrast. Multiplanar CT image reconstructions and MIPs were obtained to evaluate the vascular anatomy. Carotid stenosis measurements (when  applicable) are obtained utilizing NASCET criteria, using the distal internal carotid diameter as the denominator. Multiphase CT imaging of the brain was performed following IV bolus contrast injection. Subsequent parametric perfusion maps were calculated using RAPID software. CONTRAST:  OMNIPAQUE IOHEXOL 350 MG/ML SOLN COMPARISON:  Comparison made with prior noncontrast head CT from earlier same day. FINDINGS: CTA NECK FINDINGS Aortic arch: Visualized aortic arch of normal caliber with normal branch pattern. Moderate atheromatous plaque seen about the arch and origin of the great vessels without hemodynamically significant stenosis. Note made of soft plaque protruding into the lumen of the distal arch/proximal intrathoracic aorta (series 5, image 307). Visualized subclavian arteries widely patent. Right carotid system: Right common carotid artery patent from its origin to the bifurcation without flow-limiting stenosis. Scattered eccentric plaque about the right bifurcation without significant stenosis. Just distally within the proximal right ICA, additional focal plaque is seen with associated short-segment stenosis of up to 50% by NASCET criteria (series 5, image 169). Right ICA tortuous and medialized into the retropharyngeal space. Right ICA otherwise widely patent distally to the skull base without stenosis,  dissection or occlusion. Left carotid system: Left common carotid artery patent from its origin to the bifurcation without stenosis. Scattered eccentric mixed plaque about the left bifurcation/proximal left ICA without hemodynamically significant stenosis. Proximal left ICA tortuous and medialized into the retropharyngeal space. Left ICA widely patent distally to the skull base without stenosis, dissection or occlusion. Vertebral arteries: Both vertebral arteries arise from the subclavian arteries. Strongly dominant right vertebral artery with a markedly hypoplastic left vertebral artery noted.  Dominant right vertebral artery patent within the neck without stenosis, dissection, or occlusion. Markedly diminutive left vertebral artery patent as well without appreciable stenosis or other vascular abnormality. Skeleton: Alignment: Straightening of the normal cervical lordosis. No listhesis or malalignment. Skull base and vertebrae: Skull base intact. Normal C1-2 articulations are preserved in the dens is intact. Vertebral body heights maintained. No acute fracture. Soft tissues and spinal canal: Soft tissues of the neck demonstrate no acute finding. No abnormal prevertebral edema. Spinal canal within normal limits. Disc levels: Mild multilevel cervical spondylosis without significant stenosis, most pronounced at C6-7. Upper chest: Visualized upper chest demonstrates no acute finding. Partially visualized lung apices are clear. Other: None. Other neck: No other acute soft tissue abnormality within the neck. No mass lesion or adenopathy. Subcentimeter calcification noted within the left lobe of thyroid. Upper chest: Visualized upper chest demonstrates no acute finding. Partially visualized lungs are grossly clear. Review of the MIP images confirms the above findings CTA HEAD FINDINGS Anterior circulation: Petrous segments widely patent bilaterally. Scattered atheromatous plaque throughout the carotid siphons with relatively mild multifocal stenosis. A1 segments patent bilaterally. Normal anterior communicating artery complex. Anterior cerebral arteries widely patent proximally. Short-segment severe distal right ACA stenosis noted (series 5, image 62). ACAs otherwise widely patent to their distal aspects. No M1 stenosis or occlusion. Normal MCA bifurcations. Distal MCA branches well perfused and symmetric. Posterior circulation: Dominant right vertebral artery patent to the vertebrobasilar junction without stenosis. Right PICA not visualized. Hypoplastic left vertebral artery patent to the vertebrobasilar  junction as well. Left PICA patent. Basilar patent to its distal aspect without stenosis. Superior cerebral arteries patent bilaterally. Left PCA supplied via the basilar. Right PCA supplied via a hypoplastic right P1 segment and robust right posterior communicating artery. Both PCAs well perfused to their distal aspects without stenosis. Venous sinuses: Patent. Anatomic variants: Predominant fetal type origin of the right PCA. No intracranial aneurysm. Review of the MIP images confirms the above findings CT Brain Perfusion Findings: ASPECTS: 10 CBF (<30%) Volume: 83mL Perfusion (Tmax>6.0s) volume: 40mL Mismatch Volume: 56mL Infarction Location:Negative CT perfusion for acute core infarct or other perfusion deficit. IMPRESSION: CTA HEAD AND NECK IMPRESSION: 1. Negative CTA for emergent large vessel occlusion. 2. 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 3. Single short-segment severe distal right ACA stenosis as above. 4. Additional moderate atheromatous disease about the major arterial vasculature of the head and neck as above. No other hemodynamically significant or correctable stenosis. CT PERFUSION IMPRESSION: Negative CT perfusion. No evidence for acute core infarct or other perfusion deficit. CT CERVICAL SPINE IMPRESSION: No acute traumatic injury within the cervical spine. These results were communicated to Dr. Wilford Corner at approximately 9:00 pmon 6/6/2021by text page via the [facility] messaging system. Electronically Signed   By: Rise Mu M.D.   On: 10/01/2019 21:50   CT HEAD CODE STROKE WO CONTRAST  Result Date: 10/01/2019 CLINICAL DATA:  Code stroke. Initial evaluation for acute ataxia, left-sided weakness,  altered mental status. EXAM: CT HEAD WITHOUT CONTRAST TECHNIQUE: Contiguous axial images were obtained from the base of the skull through the vertex without intravenous contrast. COMPARISON:  None available. FINDINGS:  Brain: Generalized age-related cerebral atrophy. Patchy and confluent hypodensity throughout the periventricular deep white matter both cerebral hemispheres, most consistent with chronic small vessel ischemic disease, advanced in nature. Chronic small microvascular ischemic changes noted involving the deep gray nuclei and pons as well. Focal encephalomalacia within the right parieto-occipital region compatible with a chronic ischemic infarct. No acute intracranial hemorrhage. No acute large vessel territory infarct. No mass lesion, midline shift or mass effect. No hydrocephalus or extra-axial fluid collection. Vascular: No hyperdense vessel. Scattered vascular calcifications noted within the carotid siphons. Skull: Scalp soft tissues demonstrate no acute finding. Calvarium intact. Sinuses/Orbits: Globes and orbital soft tissues within normal limits. Left maxillary sinus retention cyst noted. Scattered mucosal thickening noted throughout the sphenoid ethmoidal sinuses, with superimposed air-fluid level within the right sphenoid sinus. Trace left mastoid effusion. Other: None. ASPECTS [facility] Stroke Program Early CT Score) - Ganglionic level infarction (caudate, lentiform nuclei, internal capsule, insula, M1-M3 cortex): 7 - Supraganglionic infarction (M4-M6 cortex): 3 Total score (0-10 with 10 being normal): 10 IMPRESSION: 1. No acute intracranial infarct or other abnormality. 2. ASPECTS is 10. 3. Chronic right parieto-occipital infarct. 4. Underlying age-related cerebral atrophy with advanced chronic microvascular ischemic disease. Critical Value/emergent results were called by telephone at the time of interpretation on 10/01/2019 at 7:57 pm to provider Dr. Wilford Corner, Who verbally acknowledged these results. Electronically Signed   By: Rise Mu M.D.   On: 10/01/2019 20:10     Medical Consultants:    None.  Anti-Infectives:   None  Subjective:    [name] Good she relates she is hungry and  crying  Objective:    Vitals:   10/02/19 0300 10/02/19 0500 10/02/19 0615 10/02/19 0700  BP: (!) 153/100 (!) 155/91 131/89 (!) 121/95  Pulse: 99 99 (!) 102 100  Resp: 20 (!) 26 (!) 22 (!) 22  Temp:      TempSrc:      SpO2: 100% 100% 99% 98%  Weight:      Height:       SpO2: 98 %   Intake/Output Summary (Last 24 hours) at 10/02/2019 0754 Last data filed at 10/02/2019 0640 Gross per 24 hour  Intake 635.59 ml  Output --  Net 635.59 ml   Filed Weights   10/02/19 0252  Weight: 105.8 kg    Exam: General exam: In no acute distress, labile emotion Respiratory system: Good air movement and clear to auscultation. Cardiovascular system: S1 & S2 heard, RRR. No JVD. Gastrointestinal system: Abdomen is nondistended, soft and nontender.  Central nervous system: Alert and oriented.  Left-sided deficits Extremities: No pedal edema. Skin: No rashes, lesions or ulcers  Data Reviewed:    Labs: Basic Metabolic Panel: Recent Labs  Lab 10/01/19 2056 10/01/19 2056 10/01/19 2105 10/02/19 0630  NA 138  --  140 140  K 2.3*   < > 2.3* 3.5  CL 102  --  102 107  CO2 22  --   --  23  GLUCOSE 106*  --  101* 107*  BUN 14  --  15 8  CREATININE 1.08*  --  0.80 0.71  CALCIUM 8.9  --   --  8.5*  MG  --   --   --  1.7   < > = values in this interval not displayed.  GFR Estimated Creatinine Clearance: 73.3 mL/min (by C-G formula based on SCr of 0.71 mg/dL). Liver Function Tests: Recent Labs  Lab 10/01/19 2056  AST 35  ALT 23  ALKPHOS 88  BILITOT 1.0  PROT 6.1*  ALBUMIN 3.5   No results for input(s): LIPASE, AMYLASE in the last 168 hours. No results for input(s): AMMONIA in the last 168 hours. Coagulation profile Recent Labs  Lab 10/01/19 2056  INR 1.1   COVID-19 Labs  No results for input(s): DDIMER, FERRITIN, LDH, CRP in the last 72 hours.  No results found for: SARSCOV2NAA  CBC: Recent Labs  Lab 10/01/19 2056 10/01/19 2105  WBC 7.3  --   NEUTROABS 4.8  --   HGB  14.0 13.6  HCT 40.7 40.0  MCV 91.5  --   PLT 158  --    Cardiac Enzymes: No results for input(s): CKTOTAL, CKMB, CKMBINDEX, TROPONINI in the last 168 hours. BNP (last 3 results) No results for input(s): PROBNP in the last 8760 hours. CBG: Recent Labs  Lab 10/01/19 2256  GLUCAP 97   D-Dimer: No results for input(s): DDIMER in the last 72 hours. Hgb A1c: Recent Labs    10/02/19 0630  HGBA1C 5.7*   Lipid Profile: No results for input(s): CHOL, HDL, LDLCALC, TRIG, CHOLHDL, LDLDIRECT in the last 72 hours. Thyroid function studies: No results for input(s): TSH, T4TOTAL, T3FREE, THYROIDAB in the last 72 hours.  Invalid input(s): FREET3 Anemia work up: No results for input(s): VITAMINB12, FOLATE, FERRITIN, TIBC, IRON, RETICCTPCT in the last 72 hours. Sepsis Labs: Recent Labs  Lab 10/01/19 2056  WBC 7.3   Microbiology No results found for this or any previous visit (from the past 240 hour(s)).   Medications:   .  stroke: mapping our early stages of recovery book   Does not apply Once  . aspirin  300 mg Rectal Daily   Or  . aspirin  325 mg Oral Daily  . atorvastatin  80 mg Oral Daily  . enoxaparin (LOVENOX) injection  40 mg Subcutaneous Q24H   Continuous Infusions: . sodium chloride        LOS: 1 day   Marinda Elk  [facility]  10/02/2019, 7:54 AM

## 2019-10-02 NOTE — Progress Notes (Signed)
STROKE TEAM PROGRESS NOTE   INTERVAL HISTORY Her husband is at the bedside.  Patient states she still has mild left-sided weakness and sensory loss.  I personally reviewed history presenting illness with the patient, husband and reviewed electronic medical records and imaging films in PACS.  MRI scan of the brain shows right thalamic lacunar infarct.  CT angiogram shows mild less than 50% right ICA stenosis but no large vessel stenosis or occlusion.  Hemoglobin A1c is 5.7.  Echocardiogram and lipid profile are pending.   Vitals:   10/02/19 0500 10/02/19 0615 10/02/19 0700 10/02/19 0800  BP: (!) 155/91 131/89 (!) 121/95 129/81  Pulse: 99 (!) 102 100   Resp: (!) 26 (!) 22 (!) 22 15  Temp:      TempSrc:      SpO2: 100% 99% 98%   Weight:      Height:        CBC:  Recent Labs  Lab 10/01/19 2056 10/01/19 2105  WBC 7.3  --   NEUTROABS 4.8  --   HGB 14.0 13.6  HCT 40.7 40.0  MCV 91.5  --   PLT 158  --     Basic Metabolic Panel:  Recent Labs  Lab 10/01/19 2056 10/01/19 2056 10/01/19 2105 10/02/19 0630  NA 138   < > 140 140  K 2.3*   < > 2.3* 3.5  CL 102   < > 102 107  CO2 22  --   --  23  GLUCOSE 106*   < > 101* 107*  BUN 14   < > 15 8  CREATININE 1.08*   < > 0.80 0.71  CALCIUM 8.9  --   --  8.5*  MG  --   --   --  1.7   < > = values in this interval not displayed.   Lipid Panel:     Component Value Date/Time   CHOL 176 10/02/2019 0630   TRIG 102 10/02/2019 0630   HDL 38 (L) 10/02/2019 0630   CHOLHDL 4.6 10/02/2019 0630   VLDL 20 10/02/2019 0630   LDLCALC 118 (H) 10/02/2019 0630   HgbA1c:  Lab Results  Component Value Date   HGBA1C 5.7 (H) 10/02/2019   Urine Drug Screen: No results found for: LABOPIA, COCAINSCRNUR, LABBENZ, AMPHETMU, THCU, LABBARB  Alcohol Level No results found for: ETH  IMAGING past 24 hours CT Code Stroke CTA Head W/WO contrast  Result Date: 10/01/2019 CLINICAL DATA:  Initial evaluation for acute stroke, left-sided weakness, altered mental  status. EXAM: CT ANGIOGRAPHY HEAD AND NECK CT PERFUSION BRAIN TECHNIQUE: Multidetector CT imaging of the head and neck was performed using the standard protocol during bolus administration of intravenous contrast. Multiplanar CT image reconstructions and MIPs were obtained to evaluate the vascular anatomy. Carotid stenosis measurements (when applicable) are obtained utilizing NASCET criteria, using the distal internal carotid diameter as the denominator. Multiphase CT imaging of the brain was performed following IV bolus contrast injection. Subsequent parametric perfusion maps were calculated using RAPID software. CONTRAST:  OMNIPAQUE IOHEXOL 350 MG/ML SOLN COMPARISON:  Comparison made with prior noncontrast head CT from earlier same day. FINDINGS: CTA NECK FINDINGS Aortic arch: Visualized aortic arch of normal caliber with normal branch pattern. Moderate atheromatous plaque seen about the arch and origin of the great vessels without hemodynamically significant stenosis. Note made of soft plaque protruding into the lumen of the distal arch/proximal intrathoracic aorta (series 5, image 307). Visualized subclavian arteries widely patent. Right carotid system: Right  common carotid artery patent from its origin to the bifurcation without flow-limiting stenosis. Scattered eccentric plaque about the right bifurcation without significant stenosis. Just distally within the proximal right ICA, additional focal plaque is seen with associated short-segment stenosis of up to 50% by NASCET criteria (series 5, image 169). Right ICA tortuous and medialized into the retropharyngeal space. Right ICA otherwise widely patent distally to the skull base without stenosis, dissection or occlusion. Left carotid system: Left common carotid artery patent from its origin to the bifurcation without stenosis. Scattered eccentric mixed plaque about the left bifurcation/proximal left ICA without hemodynamically significant stenosis.  Proximal left ICA tortuous and medialized into the retropharyngeal space. Left ICA widely patent distally to the skull base without stenosis, dissection or occlusion. Vertebral arteries: Both vertebral arteries arise from the subclavian arteries. Strongly dominant right vertebral artery with a markedly hypoplastic left vertebral artery noted. Dominant right vertebral artery patent within the neck without stenosis, dissection, or occlusion. Markedly diminutive left vertebral artery patent as well without appreciable stenosis or other vascular abnormality. Skeleton: Alignment: Straightening of the normal cervical lordosis. No listhesis or malalignment. Skull base and vertebrae: Skull base intact. Normal C1-2 articulations are preserved in the dens is intact. Vertebral body heights maintained. No acute fracture. Soft tissues and spinal canal: Soft tissues of the neck demonstrate no acute finding. No abnormal prevertebral edema. Spinal canal within normal limits. Disc levels: Mild multilevel cervical spondylosis without significant stenosis, most pronounced at C6-7. Upper chest: Visualized upper chest demonstrates no acute finding. Partially visualized lung apices are clear. Other: None. Other neck: No other acute soft tissue abnormality within the neck. No mass lesion or adenopathy. Subcentimeter calcification noted within the left lobe of thyroid. Upper chest: Visualized upper chest demonstrates no acute finding. Partially visualized lungs are grossly clear. Review of the MIP images confirms the above findings CTA HEAD FINDINGS Anterior circulation: Petrous segments widely patent bilaterally. Scattered atheromatous plaque throughout the carotid siphons with relatively mild multifocal stenosis. A1 segments patent bilaterally. Normal anterior communicating artery complex. Anterior cerebral arteries widely patent proximally. Short-segment severe distal right ACA stenosis noted (series 5, image 62). ACAs otherwise widely  patent to their distal aspects. No M1 stenosis or occlusion. Normal MCA bifurcations. Distal MCA branches well perfused and symmetric. Posterior circulation: Dominant right vertebral artery patent to the vertebrobasilar junction without stenosis. Right PICA not visualized. Hypoplastic left vertebral artery patent to the vertebrobasilar junction as well. Left PICA patent. Basilar patent to its distal aspect without stenosis. Superior cerebral arteries patent bilaterally. Left PCA supplied via the basilar. Right PCA supplied via a hypoplastic right P1 segment and robust right posterior communicating artery. Both PCAs well perfused to their distal aspects without stenosis. Venous sinuses: Patent. Anatomic variants: Predominant fetal type origin of the right PCA. No intracranial aneurysm. Review of the MIP images confirms the above findings CT Brain Perfusion Findings: ASPECTS: 10 CBF (<30%) Volume: 44mL Perfusion (Tmax>6.0s) volume: 68mL Mismatch Volume: 66mL Infarction Location:Negative CT perfusion for acute core infarct or other perfusion deficit. IMPRESSION: CTA HEAD AND NECK IMPRESSION: 1. Negative CTA for emergent large vessel occlusion. 2. 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 3. Single short-segment severe distal right ACA stenosis as above. 4. Additional moderate atheromatous disease about the major arterial vasculature of the head and neck as above. No other hemodynamically significant or correctable stenosis. CT PERFUSION IMPRESSION: Negative CT perfusion. No evidence for acute core infarct or other  perfusion deficit. CT CERVICAL SPINE IMPRESSION: No acute traumatic injury within the cervical spine. These results were communicated to Dr. Wilford Corner at approximately 9:00 pmon 6/6/2021by text page via the [facility] messaging system. Electronically Signed   By: Rise Mu M.D.   On: 10/01/2019 21:50   CT Code Stroke CTA Neck W/WO  contrast  Result Date: 10/01/2019 CLINICAL DATA:  Initial evaluation for acute stroke, left-sided weakness, altered mental status. EXAM: CT ANGIOGRAPHY HEAD AND NECK CT PERFUSION BRAIN TECHNIQUE: Multidetector CT imaging of the head and neck was performed using the standard protocol during bolus administration of intravenous contrast. Multiplanar CT image reconstructions and MIPs were obtained to evaluate the vascular anatomy. Carotid stenosis measurements (when applicable) are obtained utilizing NASCET criteria, using the distal internal carotid diameter as the denominator. Multiphase CT imaging of the brain was performed following IV bolus contrast injection. Subsequent parametric perfusion maps were calculated using RAPID software. CONTRAST:  OMNIPAQUE IOHEXOL 350 MG/ML SOLN COMPARISON:  Comparison made with prior noncontrast head CT from earlier same day. FINDINGS: CTA NECK FINDINGS Aortic arch: Visualized aortic arch of normal caliber with normal branch pattern. Moderate atheromatous plaque seen about the arch and origin of the great vessels without hemodynamically significant stenosis. Note made of soft plaque protruding into the lumen of the distal arch/proximal intrathoracic aorta (series 5, image 307). Visualized subclavian arteries widely patent. Right carotid system: Right common carotid artery patent from its origin to the bifurcation without flow-limiting stenosis. Scattered eccentric plaque about the right bifurcation without significant stenosis. Just distally within the proximal right ICA, additional focal plaque is seen with associated short-segment stenosis of up to 50% by NASCET criteria (series 5, image 169). Right ICA tortuous and medialized into the retropharyngeal space. Right ICA otherwise widely patent distally to the skull base without stenosis, dissection or occlusion. Left carotid system: Left common carotid artery patent from its origin to the bifurcation without stenosis.  Scattered eccentric mixed plaque about the left bifurcation/proximal left ICA without hemodynamically significant stenosis. Proximal left ICA tortuous and medialized into the retropharyngeal space. Left ICA widely patent distally to the skull base without stenosis, dissection or occlusion. Vertebral arteries: Both vertebral arteries arise from the subclavian arteries. Strongly dominant right vertebral artery with a markedly hypoplastic left vertebral artery noted. Dominant right vertebral artery patent within the neck without stenosis, dissection, or occlusion. Markedly diminutive left vertebral artery patent as well without appreciable stenosis or other vascular abnormality. Skeleton: Alignment: Straightening of the normal cervical lordosis. No listhesis or malalignment. Skull base and vertebrae: Skull base intact. Normal C1-2 articulations are preserved in the dens is intact. Vertebral body heights maintained. No acute fracture. Soft tissues and spinal canal: Soft tissues of the neck demonstrate no acute finding. No abnormal prevertebral edema. Spinal canal within normal limits. Disc levels: Mild multilevel cervical spondylosis without significant stenosis, most pronounced at C6-7. Upper chest: Visualized upper chest demonstrates no acute finding. Partially visualized lung apices are clear. Other: None. Other neck: No other acute soft tissue abnormality within the neck. No mass lesion or adenopathy. Subcentimeter calcification noted within the left lobe of thyroid. Upper chest: Visualized upper chest demonstrates no acute finding. Partially visualized lungs are grossly clear. Review of the MIP images confirms the above findings CTA HEAD FINDINGS Anterior circulation: Petrous segments widely patent bilaterally. Scattered atheromatous plaque throughout the carotid siphons with relatively mild multifocal stenosis. A1 segments patent bilaterally. Normal anterior communicating artery complex. Anterior cerebral arteries  widely patent proximally. Short-segment severe distal right  ACA stenosis noted (series 5, image 62). ACAs otherwise widely patent to their distal aspects. No M1 stenosis or occlusion. Normal MCA bifurcations. Distal MCA branches well perfused and symmetric. Posterior circulation: Dominant right vertebral artery patent to the vertebrobasilar junction without stenosis. Right PICA not visualized. Hypoplastic left vertebral artery patent to the vertebrobasilar junction as well. Left PICA patent. Basilar patent to its distal aspect without stenosis. Superior cerebral arteries patent bilaterally. Left PCA supplied via the basilar. Right PCA supplied via a hypoplastic right P1 segment and robust right posterior communicating artery. Both PCAs well perfused to their distal aspects without stenosis. Venous sinuses: Patent. Anatomic variants: Predominant fetal type origin of the right PCA. No intracranial aneurysm. Review of the MIP images confirms the above findings CT Brain Perfusion Findings: ASPECTS: 10 CBF (<30%) Volume: 0mL Perfusion (Tmax>6.0s) volume: 0mL Mismatch Volume: 0mL Infarction Location:Negative CT perfusion for acute core infarct or other perfusion deficit. IMPRESSION: CTA HEAD AND NECK IMPRESSION: 1. Negative CTA for emergent large vessel occlusion. 2. 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 3. Single short-segment severe distal right ACA stenosis as above. 4. Additional moderate atheromatous disease about the major arterial vasculature of the head and neck as above. No other hemodynamically significant or correctable stenosis. CT PERFUSION IMPRESSION: Negative CT perfusion. No evidence for acute core infarct or other perfusion deficit. CT CERVICAL SPINE IMPRESSION: No acute traumatic injury within the cervical spine. These results were communicated to Dr. Wilford Corner at approximately 9:00 pmon 6/6/2021by text page via the [facility]  messaging system. Electronically Signed   By: Rise Mu M.D.   On: 10/01/2019 21:50   MR BRAIN WO CONTRAST  Result Date: 10/02/2019 CLINICAL DATA:  Follow-up examination for acute stroke. EXAM: MRI HEAD WITHOUT CONTRAST TECHNIQUE: Multiplanar, multiecho pulse sequences of the brain and surrounding structures were obtained without intravenous contrast. COMPARISON:  Comparison made with prior CTs from 10/01/2019. FINDINGS: Brain: Diffuse prominence of the CSF containing spaces compatible with generalized age-related cerebral atrophy. Patchy and confluent T2/FLAIR hyperintensity within the periventricular deep white matter both cerebral hemispheres most consistent with chronic small vessel ischemic disease. Patchy involvement of the deep gray nuclei and pons. Overall, appearance is moderate to advanced in nature. Superimposed remote cortical infarct at the right temporal occipital region. 13 mm focus of diffusion abnormality at the dorsal right thalamus compatible with an acute ischemic infarct (series 5, image 76). Associated signal loss on corresponding ADC map (series 6, image 25). No associated hemorrhage or mass effect. No other evidence for acute or subacute ischemia. Gray-white matter differentiation otherwise maintained. No other areas of remote cortical infarction. No evidence for acute intracranial hemorrhage. No mass lesion, midline shift or mass effect. No hydrocephalus or extra-axial fluid collection. No made of a partially empty sella. Midline structures intact. Vascular: Major intracranial vascular flow voids are maintained. Skull and upper cervical spine: Craniocervical junction within normal limits. Bone marrow signal intensity normal. No scalp soft tissue abnormality. Sinuses/Orbits: Patient status post bilateral ocular lens replacement. Globes and orbital soft tissues demonstrate no acute finding. Scattered mucosal thickening noted throughout the paranasal sinuses with superimposed left  maxillary sinus retention cyst. Trace left mastoid effusion noted, of doubtful significance. Inner ear structures grossly normal. Other: None. IMPRESSION: 1. 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 2. 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 3. Underlying age-related cerebral atrophy with moderate to advanced chronic microvascular ischemic disease. Electronically Signed  By: Jeannine Boga M.D.   On: 10/02/2019 02:39   CT C-SPINE NO CHARGE  Result Date: 10/01/2019 CLINICAL DATA:  Initial evaluation for acute stroke, left-sided weakness, altered mental status. EXAM: CT ANGIOGRAPHY HEAD AND NECK CT PERFUSION BRAIN TECHNIQUE: Multidetector CT imaging of the head and neck was performed using the standard protocol during bolus administration of intravenous contrast. Multiplanar CT image reconstructions and MIPs were obtained to evaluate the vascular anatomy. Carotid stenosis measurements (when applicable) are obtained utilizing NASCET criteria, using the distal internal carotid diameter as the denominator. Multiphase CT imaging of the brain was performed following IV bolus contrast injection. Subsequent parametric perfusion maps were calculated using RAPID software. CONTRAST:  148mL OMNIPAQUE IOHEXOL 350 MG/ML SOLN COMPARISON:  Comparison made with prior noncontrast head CT from earlier same day. FINDINGS: CTA NECK FINDINGS Aortic arch: Visualized aortic arch of normal caliber with normal branch pattern. Moderate atheromatous plaque seen about the arch and origin of the great vessels without hemodynamically significant stenosis. Note made of soft plaque protruding into the lumen of the distal arch/proximal intrathoracic aorta (series 5, image 307). Visualized subclavian arteries widely patent. Right carotid system: Right common carotid artery patent from its origin to the bifurcation without flow-limiting stenosis. Scattered eccentric plaque about the right  bifurcation without significant stenosis. Just distally within the proximal right ICA, additional focal plaque is seen with associated short-segment stenosis of up to 50% by NASCET criteria (series 5, image 169). Right ICA tortuous and medialized into the retropharyngeal space. Right ICA otherwise widely patent distally to the skull base without stenosis, dissection or occlusion. Left carotid system: Left common carotid artery patent from its origin to the bifurcation without stenosis. Scattered eccentric mixed plaque about the left bifurcation/proximal left ICA without hemodynamically significant stenosis. Proximal left ICA tortuous and medialized into the retropharyngeal space. Left ICA widely patent distally to the skull base without stenosis, dissection or occlusion. Vertebral arteries: Both vertebral arteries arise from the subclavian arteries. Strongly dominant right vertebral artery with a markedly hypoplastic left vertebral artery noted. Dominant right vertebral artery patent within the neck without stenosis, dissection, or occlusion. Markedly diminutive left vertebral artery patent as well without appreciable stenosis or other vascular abnormality. Skeleton: Alignment: Straightening of the normal cervical lordosis. No listhesis or malalignment. Skull base and vertebrae: Skull base intact. Normal C1-2 articulations are preserved in the dens is intact. Vertebral body heights maintained. No acute fracture. Soft tissues and spinal canal: Soft tissues of the neck demonstrate no acute finding. No abnormal prevertebral edema. Spinal canal within normal limits. Disc levels: Mild multilevel cervical spondylosis without significant stenosis, most pronounced at C6-7. Upper chest: Visualized upper chest demonstrates no acute finding. Partially visualized lung apices are clear. Other: None. Other neck: No other acute soft tissue abnormality within the neck. No mass lesion or adenopathy. Subcentimeter calcification noted  within the left lobe of thyroid. Upper chest: Visualized upper chest demonstrates no acute finding. Partially visualized lungs are grossly clear. Review of the MIP images confirms the above findings CTA HEAD FINDINGS Anterior circulation: Petrous segments widely patent bilaterally. Scattered atheromatous plaque throughout the carotid siphons with relatively mild multifocal stenosis. A1 segments patent bilaterally. Normal anterior communicating artery complex. Anterior cerebral arteries widely patent proximally. Short-segment severe distal right ACA stenosis noted (series 5, image 62). ACAs otherwise widely patent to their distal aspects. No M1 stenosis or occlusion. Normal MCA bifurcations. Distal MCA branches well perfused and symmetric. Posterior circulation: Dominant right vertebral artery patent to the vertebrobasilar junction  without stenosis. Right PICA not visualized. Hypoplastic left vertebral artery patent to the vertebrobasilar junction as well. Left PICA patent. Basilar patent to its distal aspect without stenosis. Superior cerebral arteries patent bilaterally. Left PCA supplied via the basilar. Right PCA supplied via a hypoplastic right P1 segment and robust right posterior communicating artery. Both PCAs well perfused to their distal aspects without stenosis. Venous sinuses: Patent. Anatomic variants: Predominant fetal type origin of the right PCA. No intracranial aneurysm. Review of the MIP images confirms the above findings CT Brain Perfusion Findings: ASPECTS: 10 CBF (<30%) Volume: 78mL Perfusion (Tmax>6.0s) volume: 53mL Mismatch Volume: 39mL Infarction Location:Negative CT perfusion for acute core infarct or other perfusion deficit. IMPRESSION: CTA HEAD AND NECK IMPRESSION: 1. Negative CTA for emergent large vessel occlusion. 2. 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 3. Single short-segment severe distal right ACA  stenosis as above. 4. Additional moderate atheromatous disease about the major arterial vasculature of the head and neck as above. No other hemodynamically significant or correctable stenosis. CT PERFUSION IMPRESSION: Negative CT perfusion. No evidence for acute core infarct or other perfusion deficit. CT CERVICAL SPINE IMPRESSION: No acute traumatic injury within the cervical spine. These results were communicated to Dr. Wilford Corner at approximately 9:00 pmon 6/6/2021by text page via the [facility] messaging system. Electronically Signed   By: Rise Mu M.D.   On: 10/01/2019 21:50   CT Code Stroke Cerebral Perfusion with contrast  Result Date: 10/01/2019 CLINICAL DATA:  Initial evaluation for acute stroke, left-sided weakness, altered mental status. EXAM: CT ANGIOGRAPHY HEAD AND NECK CT PERFUSION BRAIN TECHNIQUE: Multidetector CT imaging of the head and neck was performed using the standard protocol during bolus administration of intravenous contrast. Multiplanar CT image reconstructions and MIPs were obtained to evaluate the vascular anatomy. Carotid stenosis measurements (when applicable) are obtained utilizing NASCET criteria, using the distal internal carotid diameter as the denominator. Multiphase CT imaging of the brain was performed following IV bolus contrast injection. Subsequent parametric perfusion maps were calculated using RAPID software. CONTRAST:  OMNIPAQUE IOHEXOL 350 MG/ML SOLN COMPARISON:  Comparison made with prior noncontrast head CT from earlier same day. FINDINGS: CTA NECK FINDINGS Aortic arch: Visualized aortic arch of normal caliber with normal branch pattern. Moderate atheromatous plaque seen about the arch and origin of the great vessels without hemodynamically significant stenosis. Note made of soft plaque protruding into the lumen of the distal arch/proximal intrathoracic aorta (series 5, image 307). Visualized subclavian arteries widely patent. Right carotid system: Right  common carotid artery patent from its origin to the bifurcation without flow-limiting stenosis. Scattered eccentric plaque about the right bifurcation without significant stenosis. Just distally within the proximal right ICA, additional focal plaque is seen with associated short-segment stenosis of up to 50% by NASCET criteria (series 5, image 169). Right ICA tortuous and medialized into the retropharyngeal space. Right ICA otherwise widely patent distally to the skull base without stenosis, dissection or occlusion. Left carotid system: Left common carotid artery patent from its origin to the bifurcation without stenosis. Scattered eccentric mixed plaque about the left bifurcation/proximal left ICA without hemodynamically significant stenosis. Proximal left ICA tortuous and medialized into the retropharyngeal space. Left ICA widely patent distally to the skull base without stenosis, dissection or occlusion. Vertebral arteries: Both vertebral arteries arise from the subclavian arteries. Strongly dominant right vertebral artery with a markedly hypoplastic left vertebral artery noted. Dominant right vertebral artery patent within the neck without  stenosis, dissection, or occlusion. Markedly diminutive left vertebral artery patent as well without appreciable stenosis or other vascular abnormality. Skeleton: Alignment: Straightening of the normal cervical lordosis. No listhesis or malalignment. Skull base and vertebrae: Skull base intact. Normal C1-2 articulations are preserved in the dens is intact. Vertebral body heights maintained. No acute fracture. Soft tissues and spinal canal: Soft tissues of the neck demonstrate no acute finding. No abnormal prevertebral edema. Spinal canal within normal limits. Disc levels: Mild multilevel cervical spondylosis without significant stenosis, most pronounced at C6-7. Upper chest: Visualized upper chest demonstrates no acute finding. Partially visualized lung apices are clear.  Other: None. Other neck: No other acute soft tissue abnormality within the neck. No mass lesion or adenopathy. Subcentimeter calcification noted within the left lobe of thyroid. Upper chest: Visualized upper chest demonstrates no acute finding. Partially visualized lungs are grossly clear. Review of the MIP images confirms the above findings CTA HEAD FINDINGS Anterior circulation: Petrous segments widely patent bilaterally. Scattered atheromatous plaque throughout the carotid siphons with relatively mild multifocal stenosis. A1 segments patent bilaterally. Normal anterior communicating artery complex. Anterior cerebral arteries widely patent proximally. Short-segment severe distal right ACA stenosis noted (series 5, image 62). ACAs otherwise widely patent to their distal aspects. No M1 stenosis or occlusion. Normal MCA bifurcations. Distal MCA branches well perfused and symmetric. Posterior circulation: Dominant right vertebral artery patent to the vertebrobasilar junction without stenosis. Right PICA not visualized. Hypoplastic left vertebral artery patent to the vertebrobasilar junction as well. Left PICA patent. Basilar patent to its distal aspect without stenosis. Superior cerebral arteries patent bilaterally. Left PCA supplied via the basilar. Right PCA supplied via a hypoplastic right P1 segment and robust right posterior communicating artery. Both PCAs well perfused to their distal aspects without stenosis. Venous sinuses: Patent. Anatomic variants: Predominant fetal type origin of the right PCA. No intracranial aneurysm. Review of the MIP images confirms the above findings CT Brain Perfusion Findings: ASPECTS: 10 CBF (<30%) Volume: 0mL Perfusion (Tmax>6.0s) volume: 0mL Mismatch Volume: 0mL Infarction Location:Negative CT perfusion for acute core infarct or other perfusion deficit. IMPRESSION: CTA HEAD AND NECK IMPRESSION: 1. Negative CTA for emergent large vessel occlusion. 2. Atheromatous plaque about the  proximal right ICA with associated short-segment stenosis of up to 50% by NASCET criteria. 3. Single short-segment severe distal right ACA stenosis as above. 4. Additional moderate atheromatous disease about the major arterial vasculature of the head and neck as above. No other hemodynamically significant or correctable stenosis. CT PERFUSION IMPRESSION: Negative CT perfusion. No evidence for acute core infarct or other perfusion deficit. CT CERVICAL SPINE IMPRESSION: No acute traumatic injury within the cervical spine. These results were communicated to Dr. Wilford Corner at approximately 9:00 pmon 6/6/2021by text page via the [facility] messaging system. Electronically Signed   By: Rise Mu M.D.   On: 10/01/2019 21:50   CT HEAD CODE STROKE WO CONTRAST  Result Date: 10/01/2019 CLINICAL DATA:  Code stroke. Initial evaluation for acute ataxia, left-sided weakness, altered mental status. EXAM: CT HEAD WITHOUT CONTRAST TECHNIQUE: Contiguous axial images were obtained from the base of the skull through the vertex without intravenous contrast. COMPARISON:  None available. FINDINGS: Brain: Generalized age-related cerebral atrophy. Patchy and confluent hypodensity throughout the periventricular deep white matter both cerebral hemispheres, most consistent with chronic small vessel ischemic disease, advanced in nature. Chronic small microvascular ischemic changes noted involving the deep gray nuclei and pons as well. Focal encephalomalacia within the right parieto-occipital region compatible with a chronic ischemic infarct. No  acute intracranial hemorrhage. No acute large vessel territory infarct. No mass lesion, midline shift or mass effect. No hydrocephalus or extra-axial fluid collection. Vascular: No hyperdense vessel. Scattered vascular calcifications noted within the carotid siphons. Skull: Scalp soft tissues demonstrate no acute finding. Calvarium intact. Sinuses/Orbits: Globes and orbital soft tissues within normal  limits. Left maxillary sinus retention cyst noted. Scattered mucosal thickening noted throughout the sphenoid ethmoidal sinuses, with superimposed air-fluid level within the right sphenoid sinus. Trace left mastoid effusion. Other: None. ASPECTS [facility]([place] Stroke [facility] Early CT Score) - Ganglionic level infarction (caudate, lentiform nuclei, internal capsule, insula, M1-M3 cortex): 7 - Supraganglionic infarction (M4-M6 cortex): 3 Total score (0-10 with 10 being normal): 10 IMPRESSION: 1. No acute intracranial infarct or other abnormality. 2. ASPECTS is 10. 3. Chronic right parieto-occipital infarct. 4. Underlying age-related cerebral atrophy with advanced chronic microvascular ischemic disease. Critical Value/emergent results were called by telephone at the time of interpretation on 10/01/2019 at 7:57 pm to provider Dr. Wilford CornerArora, Who verbally acknowledged these results. Electronically Signed   By: Rise MuBenjamin  McClintock M.D.   On: 10/01/2019 20:10    PHYSICAL EXAM Pleasant elderly Caucasian lady not in distress. . Afebrile. Head is nontraumatic. Neck is supple without bruit.    Cardiac exam no murmur or gallop. Lungs are clear to auscultation. Distal pulses are well felt. Neurological Exam : Awake but drowsy can be easily aroused.  Has right gaze preference but is able to look to the left all the way past midline.  Has mild left-sided neglect.  Has mild dysarthria but can be understood.  Left lower facial weakness.  Tongue midline.  Mild left hemiparesis 4/5 strength with weakness of left grip and intrinsic hand muscles as well as left hip flexors and ankle dorsiflexors.  Mild decrease sensation on the left.  Sensory extinction on the left.  Left plantar equivocal right downgoing.  Gait not tested. ASSESSMENT/PLAN [name] is a 69 y.o. female with history of CAD, depression, arthritis, GERD, RLS presenting with L sided weakness.   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 Small vessel disease. Atrophy. Old R parietal occipital infarct. ASPECTS 10.     CT CS no acute trauma  CTA head & neck no LVO. Proximal R ICA 50% stenosis. Severe distal R ACA short segment stenosis. Moderate atherosclerosis head and neck.   CT perfusion no core infarct, no perfusion deficits  MRI  R thalamic infarct. Old R temporal occipital cortical infarct. Small vessel disease. Atrophy.   2D Echo pending   LDL 118  HgbA1c 5.7  Lovenox 40 mg sq daily for VTE prophylaxis  Inconsistently taking aspirin 325 mg daily prior to admission, now on aspirin 325 mg daily. Treat w/ DAPT x 3 weeks then Aspirin alone. Orders adjusted  Therapy recommendations:  pending   Disposition:  pending   Patient interested in participating in the [employer] trial. [facility] will follow up for possible enrollment. Please contact them at [phone] for any questions.    Hypertension  Stable . Permissive hypertension (OK if < 220/120) but gradually normalize in 5-7 days . Long-term BP goal normotensive  Hyperlipidemia  Home meds:  No statin  Now on lipitor 80  LDL 118, goal < 70  Continue statin at discharge  Other Stroke Risk Factors  Advanced age  Former Cigarette smoker  Current ETOH use  Morbid Obesity, Body mass index is  44.07 kg/m., recommend weight loss, diet and exercise as appropriate   Coronary artery disease - MI  Other Active Problems  Severe hypokalemia d/t poor intake d/t grief  Severe dehydration d/t poor intake d/t grief  Acute grief (recent death of her mother)  RLS on requip  Hospital day # 1 Patient has presented with left hemiparesis, and attention and sensory loss due to right thalamic infarct likely from small vessel disease.  Recommend continue ongoing stroke work-up and aggressive risk factor modification.  Check echocardiogram, lipid profile.  Aspirin and Plavix for 3 weeks followed  by aspirin alone.  Long discussion with the patient and husband initially and subsequently with her daughter who arrived later about a stroke work-up and answered questions about her care.  Discussed with Dr. Darin Engels.  Greater than 50% time during this 35-minute visit was spent in counseling and coordination of care about her stroke and discussion with stroke prevention and treatment and answering questions. Delia Heady, MD To contact Stroke Continuity provider, please refer to [email or web address]. After hours, contact General Neurology

## 2019-10-02 NOTE — ED Notes (Signed)
Pt transferred to MRI

## 2019-10-02 NOTE — ED Notes (Signed)
Potassium is delay do to pt continuously bending her arm and family refusing to have the potassium running on her hand, family and pt oriented multiple time about the importance of having this potassium going on time without success, family becoming rude with staff and still refusing to have the potassium running on the correct line.

## 2019-10-02 NOTE — ED Notes (Signed)
Pt's 6th bag of K+ infusing

## 2019-10-03 DIAGNOSIS — I639 Cerebral infarction, unspecified: Secondary | ICD-10-CM

## 2019-10-03 MED ORDER — ADULT MULTIVITAMIN W/MINERALS CH
1.0000 | ORAL_TABLET | Freq: Every day | ORAL | Status: DC
  Administered 2019-10-03 – 2019-10-05 (×3): 1 via ORAL
  Filled 2019-10-03 (×3): qty 1

## 2019-10-03 MED ORDER — APIXABAN 5 MG PO TABS
10.0000 mg | ORAL_TABLET | Freq: Two times a day (BID) | ORAL | Status: DC
  Administered 2019-10-03 – 2019-10-05 (×4): 10 mg via ORAL
  Filled 2019-10-03 (×4): qty 2

## 2019-10-03 MED ORDER — OXYCODONE-ACETAMINOPHEN 7.5-325 MG PO TABS
1.0000 | ORAL_TABLET | Freq: Three times a day (TID) | ORAL | Status: AC | PRN
  Administered 2019-10-03: 1 via ORAL
  Filled 2019-10-03: qty 1

## 2019-10-03 MED ORDER — METHOCARBAMOL 500 MG PO TABS
500.0000 mg | ORAL_TABLET | Freq: Three times a day (TID) | ORAL | Status: DC | PRN
  Administered 2019-10-03 – 2019-10-05 (×5): 500 mg via ORAL
  Filled 2019-10-03 (×5): qty 1

## 2019-10-03 MED ORDER — APIXABAN 5 MG PO TABS: 5.0000 mg | ORAL_TABLET | Freq: Two times a day (BID) | ORAL | Status: DC

## 2019-10-03 MED ORDER — APIXABAN 5 MG PO TABS
10.0000 mg | ORAL_TABLET | Freq: Once | ORAL | Status: AC
  Administered 2019-10-03: 10 mg via ORAL
  Filled 2019-10-03: qty 2

## 2019-10-03 MED ORDER — BOOST / RESOURCE BREEZE PO LIQD CUSTOM
1.0000 | Freq: Three times a day (TID) | ORAL | Status: DC
  Administered 2019-10-03 – 2019-10-04 (×4): 1 via ORAL

## 2019-10-03 MED ORDER — POTASSIUM CHLORIDE CRYS ER 20 MEQ PO TBCR
20.0000 meq | EXTENDED_RELEASE_TABLET | Freq: Two times a day (BID) | ORAL | Status: DC
  Administered 2019-10-03 – 2019-10-05 (×5): 20 meq via ORAL
  Filled 2019-10-03 (×5): qty 1

## 2019-10-03 NOTE — Progress Notes (Signed)
[facility] PROGRESS NOTE    Progress Note  [name] Good  GHW:[id number] DOB: 19-Feb-1951 DOA: 10/01/2019 PCP: Kaleen Mask, MD     Brief Narrative:   [name] Good is an 69 y.o. female past medical history significant for CAD, depression GERD RLS presents to the ED as a code stroke for acute left-sided weakness they called her house and she was on the floor they found her to have a slurred speech and left-sided weakness seen in outside ED by neurology felt to be outside of TPA window, CT of the head was negative for acute CVA, CTA of the head and neck was negative for LVO, basic metabolic panel showed a potassium of 2.3 without acute EKG changes.  Assessment/Plan:   Acute CVA (cerebrovascular accident) (HCC) HgbA1c 5.7, fasting lipid panel HDL less than 40 LDL greater than 70. MRI, MRA of the brain without contrast showed a right thalamic infarct. PT, OT, Speech consult pending Transthoracic Echo,   pending Start patient on ASA  daily and plavix  daily  for 3 weeks then aspirin alone. Atorvastatin 80 mg Physical therapy and Occupational Therapy are pending. Transitional care team is involved will probably need skilled nursing facility.  Hypokalemia: Replete orally now improved. Try to keep closer to four.  Acute kidney injury: Prerenal azotemia resolved with IV fluid hydration.  Grief reaction She has been counsel currently with labile emotion.  RLS (restless legs syndrome) Continue Requip.    DVT prophylaxis: lovenox Family Communication:none Status is: Inpatient  Remains inpatient appropriate because:Hemodynamically unstable   Dispo: The patient is from: Home              Anticipated d/c is to: SNF              Anticipated d/c date is: 2 days              Patient currently is not medically stable to d/c.        Code Status:     Code Status Orders  (From admission, onward)         Start     Ordered   10/01/19 2329  Full code   Continuous     10/01/19 2330        Code Status History    Date Active Date Inactive Code Status Order ID Comments User Context   10/16/2017 0920 10/20/2017 0025 Full Code [id number]  Lupita Leash, MD Inpatient   10/25/2015 1543 10/28/2015 1535 Full Code [id number]  Kathryne Hitch, MD Inpatient   Advance Care Planning Activity        IV Access:    Peripheral IV   Procedures and diagnostic studies:   CT Code Stroke CTA Head W/WO contrast  Result Date: 10/01/2019 CLINICAL DATA:  Initial evaluation for acute stroke, left-sided weakness, altered mental status. EXAM: CT ANGIOGRAPHY HEAD AND NECK CT PERFUSION BRAIN TECHNIQUE: Multidetector CT imaging of the head and neck was performed using the standard protocol during bolus administration of intravenous contrast. Multiplanar CT image reconstructions and MIPs were obtained to evaluate the vascular anatomy. Carotid stenosis measurements (when applicable) are obtained utilizing NASCET criteria, using the distal internal carotid diameter as the denominator. Multiphase CT imaging of the brain was performed following IV bolus contrast injection. Subsequent parametric perfusion maps were calculated using RAPID software. CONTRAST:  OMNIPAQUE IOHEXOL 350 MG/ML SOLN COMPARISON:  Comparison made with prior noncontrast head CT from earlier same day. FINDINGS: CTA NECK FINDINGS Aortic arch: Visualized  aortic arch of normal caliber with normal branch pattern. Moderate atheromatous plaque seen about the arch and origin of the great vessels without hemodynamically significant stenosis. Note made of soft plaque protruding into the lumen of the distal arch/proximal intrathoracic aorta (series 5, image 307). Visualized subclavian arteries widely patent. Right carotid system: Right common carotid artery patent from its origin to the bifurcation without flow-limiting stenosis. Scattered eccentric plaque about the right bifurcation without significant  stenosis. Just distally within the proximal right ICA, additional focal plaque is seen with associated short-segment stenosis of up to 50% by NASCET criteria (series 5, image 169). Right ICA tortuous and medialized into the retropharyngeal space. Right ICA otherwise widely patent distally to the skull base without stenosis, dissection or occlusion. Left carotid system: Left common carotid artery patent from its origin to the bifurcation without stenosis. Scattered eccentric mixed plaque about the left bifurcation/proximal left ICA without hemodynamically significant stenosis. Proximal left ICA tortuous and medialized into the retropharyngeal space. Left ICA widely patent distally to the skull base without stenosis, dissection or occlusion. Vertebral arteries: Both vertebral arteries arise from the subclavian arteries. Strongly dominant right vertebral artery with a markedly hypoplastic left vertebral artery noted. Dominant right vertebral artery patent within the neck without stenosis, dissection, or occlusion. Markedly diminutive left vertebral artery patent as well without appreciable stenosis or other vascular abnormality. Skeleton: Alignment: Straightening of the normal cervical lordosis. No listhesis or malalignment. Skull base and vertebrae: Skull base intact. Normal C1-2 articulations are preserved in the dens is intact. Vertebral body heights maintained. No acute fracture. Soft tissues and spinal canal: Soft tissues of the neck demonstrate no acute finding. No abnormal prevertebral edema. Spinal canal within normal limits. Disc levels: Mild multilevel cervical spondylosis without significant stenosis, most pronounced at C6-7. Upper chest: Visualized upper chest demonstrates no acute finding. Partially visualized lung apices are clear. Other: None. Other neck: No other acute soft tissue abnormality within the neck. No mass lesion or adenopathy. Subcentimeter calcification noted within the left lobe of  thyroid. Upper chest: Visualized upper chest demonstrates no acute finding. Partially visualized lungs are grossly clear. Review of the MIP images confirms the above findings CTA HEAD FINDINGS Anterior circulation: Petrous segments widely patent bilaterally. Scattered atheromatous plaque throughout the carotid siphons with relatively mild multifocal stenosis. A1 segments patent bilaterally. Normal anterior communicating artery complex. Anterior cerebral arteries widely patent proximally. Short-segment severe distal right ACA stenosis noted (series 5, image 62). ACAs otherwise widely patent to their distal aspects. No M1 stenosis or occlusion. Normal MCA bifurcations. Distal MCA branches well perfused and symmetric. Posterior circulation: Dominant right vertebral artery patent to the vertebrobasilar junction without stenosis. Right PICA not visualized. Hypoplastic left vertebral artery patent to the vertebrobasilar junction as well. Left PICA patent. Basilar patent to its distal aspect without stenosis. Superior cerebral arteries patent bilaterally. Left PCA supplied via the basilar. Right PCA supplied via a hypoplastic right P1 segment and robust right posterior communicating artery. Both PCAs well perfused to their distal aspects without stenosis. Venous sinuses: Patent. Anatomic variants: Predominant fetal type origin of the right PCA. No intracranial aneurysm. Review of the MIP images confirms the above findings CT Brain Perfusion Findings: ASPECTS: 10 CBF (<30%) Volume: 16mL Perfusion (Tmax>6.0s) volume: 17mL Mismatch Volume: 35mL Infarction Location:Negative CT perfusion for acute core infarct or other perfusion deficit. IMPRESSION: CTA HEAD AND NECK IMPRESSION: 1. Negative CTA for emergent large vessel occlusion. 2. Atheromatous plaque about the proximal right ICA with associated short-segment stenosis of  up to 50% by NASCET criteria. 3. Single short-segment severe distal right ACA stenosis as above. 4.  Additional moderate atheromatous disease about the major arterial vasculature of the head and neck as above. No other hemodynamically significant or correctable stenosis. CT PERFUSION IMPRESSION: Negative CT perfusion. No evidence for acute core infarct or other perfusion deficit. CT CERVICAL SPINE IMPRESSION: No acute traumatic injury within the cervical spine. These results were communicated to Dr. Wilford Corner at approximately 9:00 pmon 6/6/2021by text page via the [facility] messaging system. Electronically Signed   By: Rise Mu M.D.   On: 10/01/2019 21:50   CT Code Stroke CTA Neck W/WO contrast  Result Date: 10/01/2019 CLINICAL DATA:  Initial evaluation for acute stroke, left-sided weakness, altered mental status. EXAM: CT ANGIOGRAPHY HEAD AND NECK CT PERFUSION BRAIN TECHNIQUE: Multidetector CT imaging of the head and neck was performed using the standard protocol during bolus administration of intravenous contrast. Multiplanar CT image reconstructions and MIPs were obtained to evaluate the vascular anatomy. Carotid stenosis measurements (when applicable) are obtained utilizing NASCET criteria, using the distal internal carotid diameter as the denominator. Multiphase CT imaging of the brain was performed following IV bolus contrast injection. Subsequent parametric perfusion maps were calculated using RAPID software. CONTRAST:  OMNIPAQUE IOHEXOL 350 MG/ML SOLN COMPARISON:  Comparison made with prior noncontrast head CT from earlier same day. FINDINGS: CTA NECK FINDINGS Aortic arch: Visualized aortic arch of normal caliber with normal branch pattern. Moderate atheromatous plaque seen about the arch and origin of the great vessels without hemodynamically significant stenosis. Note made of soft plaque protruding into the lumen of the distal arch/proximal intrathoracic aorta (series 5, image 307). Visualized subclavian arteries widely patent. Right carotid system: Right common carotid artery patent from its  origin to the bifurcation without flow-limiting stenosis. Scattered eccentric plaque about the right bifurcation without significant stenosis. Just distally within the proximal right ICA, additional focal plaque is seen with associated short-segment stenosis of up to 50% by NASCET criteria (series 5, image 169). Right ICA tortuous and medialized into the retropharyngeal space. Right ICA otherwise widely patent distally to the skull base without stenosis, dissection or occlusion. Left carotid system: Left common carotid artery patent from its origin to the bifurcation without stenosis. Scattered eccentric mixed plaque about the left bifurcation/proximal left ICA without hemodynamically significant stenosis. Proximal left ICA tortuous and medialized into the retropharyngeal space. Left ICA widely patent distally to the skull base without stenosis, dissection or occlusion. Vertebral arteries: Both vertebral arteries arise from the subclavian arteries. Strongly dominant right vertebral artery with a markedly hypoplastic left vertebral artery noted. Dominant right vertebral artery patent within the neck without stenosis, dissection, or occlusion. Markedly diminutive left vertebral artery patent as well without appreciable stenosis or other vascular abnormality. Skeleton: Alignment: Straightening of the normal cervical lordosis. No listhesis or malalignment. Skull base and vertebrae: Skull base intact. Normal C1-2 articulations are preserved in the dens is intact. Vertebral body heights maintained. No acute fracture. Soft tissues and spinal canal: Soft tissues of the neck demonstrate no acute finding. No abnormal prevertebral edema. Spinal canal within normal limits. Disc levels: Mild multilevel cervical spondylosis without significant stenosis, most pronounced at C6-7. Upper chest: Visualized upper chest demonstrates no acute finding. Partially visualized lung apices are clear. Other: None. Other neck: No other acute  soft tissue abnormality within the neck. No mass lesion or adenopathy. Subcentimeter calcification noted within the left lobe of thyroid. Upper chest: Visualized upper chest demonstrates no acute finding. Partially visualized  lungs are grossly clear. Review of the MIP images confirms the above findings CTA HEAD FINDINGS Anterior circulation: Petrous segments widely patent bilaterally. Scattered atheromatous plaque throughout the carotid siphons with relatively mild multifocal stenosis. A1 segments patent bilaterally. Normal anterior communicating artery complex. Anterior cerebral arteries widely patent proximally. Short-segment severe distal right ACA stenosis noted (series 5, image 62). ACAs otherwise widely patent to their distal aspects. No M1 stenosis or occlusion. Normal MCA bifurcations. Distal MCA branches well perfused and symmetric. Posterior circulation: Dominant right vertebral artery patent to the vertebrobasilar junction without stenosis. Right PICA not visualized. Hypoplastic left vertebral artery patent to the vertebrobasilar junction as well. Left PICA patent. Basilar patent to its distal aspect without stenosis. Superior cerebral arteries patent bilaterally. Left PCA supplied via the basilar. Right PCA supplied via a hypoplastic right P1 segment and robust right posterior communicating artery. Both PCAs well perfused to their distal aspects without stenosis. Venous sinuses: Patent. Anatomic variants: Predominant fetal type origin of the right PCA. No intracranial aneurysm. Review of the MIP images confirms the above findings CT Brain Perfusion Findings: ASPECTS: 10 CBF (<30%) Volume: 0mL Perfusion (Tmax>6.0s) volume: 0mL Mismatch Volume: 0mL Infarction Location:Negative CT perfusion for acute core infarct or other perfusion deficit. IMPRESSION: CTA HEAD AND NECK IMPRESSION: 1. Negative CTA for emergent large vessel occlusion. 2. Atheromatous plaque about the proximal right ICA with associated  short-segment stenosis of up to 50% by NASCET criteria. 3. Single short-segment severe distal right ACA stenosis as above. 4. Additional moderate atheromatous disease about the major arterial vasculature of the head and neck as above. No other hemodynamically significant or correctable stenosis. CT PERFUSION IMPRESSION: Negative CT perfusion. No evidence for acute core infarct or other perfusion deficit. CT CERVICAL SPINE IMPRESSION: No acute traumatic injury within the cervical spine. These results were communicated to Dr. Wilford Corner at approximately 9:00 pmon 6/6/2021by text page via the [facility] messaging system. Electronically Signed   By: Rise Mu M.D.   On: 10/01/2019 21:50   MR BRAIN WO CONTRAST  Result Date: 10/02/2019 CLINICAL DATA:  Follow-up examination for acute stroke. EXAM: MRI HEAD WITHOUT CONTRAST TECHNIQUE: Multiplanar, multiecho pulse sequences of the brain and surrounding structures were obtained without intravenous contrast. COMPARISON:  Comparison made with prior CTs from 10/01/2019. FINDINGS: Brain: Diffuse prominence of the CSF containing spaces compatible with generalized age-related cerebral atrophy. Patchy and confluent T2/FLAIR hyperintensity within the periventricular deep white matter both cerebral hemispheres most consistent with chronic small vessel ischemic disease. Patchy involvement of the deep gray nuclei and pons. Overall, appearance is moderate to advanced in nature. Superimposed remote cortical infarct at the right temporal occipital region. 13 mm focus of diffusion abnormality at the dorsal right thalamus compatible with an acute ischemic infarct (series 5, image 76). Associated signal loss on corresponding ADC map (series 6, image 25). No associated hemorrhage or mass effect. No other evidence for acute or subacute ischemia. Gray-white matter differentiation otherwise maintained. No other areas of remote cortical infarction. No evidence for acute intracranial  hemorrhage. No mass lesion, midline shift or mass effect. No hydrocephalus or extra-axial fluid collection. No made of a partially empty sella. Midline structures intact. Vascular: Major intracranial vascular flow voids are maintained. Skull and upper cervical spine: Craniocervical junction within normal limits. Bone marrow signal intensity normal. No scalp soft tissue abnormality. Sinuses/Orbits: Patient status post bilateral ocular lens replacement. Globes and orbital soft tissues demonstrate no acute finding. Scattered mucosal thickening noted throughout the paranasal sinuses with superimposed left maxillary  sinus retention cyst. Trace left mastoid effusion noted, of doubtful significance. Inner ear structures grossly normal. Other: None. IMPRESSION: 1. 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 2. 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 3. Underlying age-related cerebral atrophy with moderate to advanced chronic microvascular ischemic disease. Electronically Signed   By: Rise Mu M.D.   On: 10/02/2019 02:39   CT C-SPINE NO CHARGE  Result Date: 10/01/2019 CLINICAL DATA:  Initial evaluation for acute stroke, left-sided weakness, altered mental status. EXAM: CT ANGIOGRAPHY HEAD AND NECK CT PERFUSION BRAIN TECHNIQUE: Multidetector CT imaging of the head and neck was performed using the standard protocol during bolus administration of intravenous contrast. Multiplanar CT image reconstructions and MIPs were obtained to evaluate the vascular anatomy. Carotid stenosis measurements (when applicable) are obtained utilizing NASCET criteria, using the distal internal carotid diameter as the denominator. Multiphase CT imaging of the brain was performed following IV bolus contrast injection. Subsequent parametric perfusion maps were calculated using RAPID software. CONTRAST:  OMNIPAQUE IOHEXOL 350 MG/ML SOLN COMPARISON:  Comparison made with prior  noncontrast head CT from earlier same day. FINDINGS: CTA NECK FINDINGS Aortic arch: Visualized aortic arch of normal caliber with normal branch pattern. Moderate atheromatous plaque seen about the arch and origin of the great vessels without hemodynamically significant stenosis. Note made of soft plaque protruding into the lumen of the distal arch/proximal intrathoracic aorta (series 5, image 307). Visualized subclavian arteries widely patent. Right carotid system: Right common carotid artery patent from its origin to the bifurcation without flow-limiting stenosis. Scattered eccentric plaque about the right bifurcation without significant stenosis. Just distally within the proximal right ICA, additional focal plaque is seen with associated short-segment stenosis of up to 50% by NASCET criteria (series 5, image 169). Right ICA tortuous and medialized into the retropharyngeal space. Right ICA otherwise widely patent distally to the skull base without stenosis, dissection or occlusion. Left carotid system: Left common carotid artery patent from its origin to the bifurcation without stenosis. Scattered eccentric mixed plaque about the left bifurcation/proximal left ICA without hemodynamically significant stenosis. Proximal left ICA tortuous and medialized into the retropharyngeal space. Left ICA widely patent distally to the skull base without stenosis, dissection or occlusion. Vertebral arteries: Both vertebral arteries arise from the subclavian arteries. Strongly dominant right vertebral artery with a markedly hypoplastic left vertebral artery noted. Dominant right vertebral artery patent within the neck without stenosis, dissection, or occlusion. Markedly diminutive left vertebral artery patent as well without appreciable stenosis or other vascular abnormality. Skeleton: Alignment: Straightening of the normal cervical lordosis. No listhesis or malalignment. Skull base and vertebrae: Skull base intact. Normal C1-2  articulations are preserved in the dens is intact. Vertebral body heights maintained. No acute fracture. Soft tissues and spinal canal: Soft tissues of the neck demonstrate no acute finding. No abnormal prevertebral edema. Spinal canal within normal limits. Disc levels: Mild multilevel cervical spondylosis without significant stenosis, most pronounced at C6-7. Upper chest: Visualized upper chest demonstrates no acute finding. Partially visualized lung apices are clear. Other: None. Other neck: No other acute soft tissue abnormality within the neck. No mass lesion or adenopathy. Subcentimeter calcification noted within the left lobe of thyroid. Upper chest: Visualized upper chest demonstrates no acute finding. Partially visualized lungs are grossly clear. Review of the MIP images confirms the above findings CTA HEAD FINDINGS Anterior circulation: Petrous segments widely patent bilaterally. Scattered atheromatous plaque throughout the carotid siphons with relatively mild multifocal stenosis. A1 segments patent bilaterally. Normal  anterior communicating artery complex. Anterior cerebral arteries widely patent proximally. Short-segment severe distal right ACA stenosis noted (series 5, image 62). ACAs otherwise widely patent to their distal aspects. No M1 stenosis or occlusion. Normal MCA bifurcations. Distal MCA branches well perfused and symmetric. Posterior circulation: Dominant right vertebral artery patent to the vertebrobasilar junction without stenosis. Right PICA not visualized. Hypoplastic left vertebral artery patent to the vertebrobasilar junction as well. Left PICA patent. Basilar patent to its distal aspect without stenosis. Superior cerebral arteries patent bilaterally. Left PCA supplied via the basilar. Right PCA supplied via a hypoplastic right P1 segment and robust right posterior communicating artery. Both PCAs well perfused to their distal aspects without stenosis. Venous sinuses: Patent. Anatomic  variants: Predominant fetal type origin of the right PCA. No intracranial aneurysm. Review of the MIP images confirms the above findings CT Brain Perfusion Findings: ASPECTS: 10 CBF (<30%) Volume: 0mL Perfusion (Tmax>6.0s) volume: 0mL Mismatch Volume: 0mL Infarction Location:Negative CT perfusion for acute core infarct or other perfusion deficit. IMPRESSION: CTA HEAD AND NECK IMPRESSION: 1. Negative CTA for emergent large vessel occlusion. 2. 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 3. Single short-segment severe distal right ACA stenosis as above. 4. Additional moderate atheromatous disease about the major arterial vasculature of the head and neck as above. No other hemodynamically significant or correctable stenosis. CT PERFUSION IMPRESSION: Negative CT perfusion. No evidence for acute core infarct or other perfusion deficit. CT CERVICAL SPINE IMPRESSION: No acute traumatic injury within the cervical spine. These results were communicated to Dr. Wilford CornerArora at approximately 9:00 pmon 6/6/2021by text page via the [facility] messaging system. Electronically Signed   By: Rise MuBenjamin  McClintock M.D.   On: 10/01/2019 21:50   CT Code Stroke Cerebral Perfusion with contrast  Result Date: 10/01/2019 CLINICAL DATA:  Initial evaluation for acute stroke, left-sided weakness, altered mental status. EXAM: CT ANGIOGRAPHY HEAD AND NECK CT PERFUSION BRAIN TECHNIQUE: Multidetector CT imaging of the head and neck was performed using the standard protocol during bolus administration of intravenous contrast. Multiplanar CT image reconstructions and MIPs were obtained to evaluate the vascular anatomy. Carotid stenosis measurements (when applicable) are obtained utilizing NASCET criteria, using the distal internal carotid diameter as the denominator. Multiphase CT imaging of the brain was performed following IV bolus contrast injection. Subsequent parametric  perfusion maps were calculated using RAPID software. CONTRAST:  100mL OMNIPAQUE IOHEXOL 350 MG/ML SOLN COMPARISON:  Comparison made with prior noncontrast head CT from earlier same day. FINDINGS: CTA NECK FINDINGS Aortic arch: Visualized aortic arch of normal caliber with normal branch pattern. Moderate atheromatous plaque seen about the arch and origin of the great vessels without hemodynamically significant stenosis. Note made of soft plaque protruding into the lumen of the distal arch/proximal intrathoracic aorta (series 5, image 307). Visualized subclavian arteries widely patent. Right carotid system: Right common carotid artery patent from its origin to the bifurcation without flow-limiting stenosis. Scattered eccentric plaque about the right bifurcation without significant stenosis. Just distally within the proximal right ICA, additional focal plaque is seen with associated short-segment stenosis of up to 50% by NASCET criteria (series 5, image 169). Right ICA tortuous and medialized into the retropharyngeal space. Right ICA otherwise widely patent distally to the skull base without stenosis, dissection or occlusion. Left carotid system: Left common carotid artery patent from its origin to the bifurcation without stenosis. Scattered eccentric mixed plaque about the left bifurcation/proximal left ICA without hemodynamically significant stenosis. Proximal  left ICA tortuous and medialized into the retropharyngeal space. Left ICA widely patent distally to the skull base without stenosis, dissection or occlusion. Vertebral arteries: Both vertebral arteries arise from the subclavian arteries. Strongly dominant right vertebral artery with a markedly hypoplastic left vertebral artery noted. Dominant right vertebral artery patent within the neck without stenosis, dissection, or occlusion. Markedly diminutive left vertebral artery patent as well without appreciable stenosis or other vascular abnormality. Skeleton:  Alignment: Straightening of the normal cervical lordosis. No listhesis or malalignment. Skull base and vertebrae: Skull base intact. Normal C1-2 articulations are preserved in the dens is intact. Vertebral body heights maintained. No acute fracture. Soft tissues and spinal canal: Soft tissues of the neck demonstrate no acute finding. No abnormal prevertebral edema. Spinal canal within normal limits. Disc levels: Mild multilevel cervical spondylosis without significant stenosis, most pronounced at C6-7. Upper chest: Visualized upper chest demonstrates no acute finding. Partially visualized lung apices are clear. Other: None. Other neck: No other acute soft tissue abnormality within the neck. No mass lesion or adenopathy. Subcentimeter calcification noted within the left lobe of thyroid. Upper chest: Visualized upper chest demonstrates no acute finding. Partially visualized lungs are grossly clear. Review of the MIP images confirms the above findings CTA HEAD FINDINGS Anterior circulation: Petrous segments widely patent bilaterally. Scattered atheromatous plaque throughout the carotid siphons with relatively mild multifocal stenosis. A1 segments patent bilaterally. Normal anterior communicating artery complex. Anterior cerebral arteries widely patent proximally. Short-segment severe distal right ACA stenosis noted (series 5, image 62). ACAs otherwise widely patent to their distal aspects. No M1 stenosis or occlusion. Normal MCA bifurcations. Distal MCA branches well perfused and symmetric. Posterior circulation: Dominant right vertebral artery patent to the vertebrobasilar junction without stenosis. Right PICA not visualized. Hypoplastic left vertebral artery patent to the vertebrobasilar junction as well. Left PICA patent. Basilar patent to its distal aspect without stenosis. Superior cerebral arteries patent bilaterally. Left PCA supplied via the basilar. Right PCA supplied via a hypoplastic right P1 segment and  robust right posterior communicating artery. Both PCAs well perfused to their distal aspects without stenosis. Venous sinuses: Patent. Anatomic variants: Predominant fetal type origin of the right PCA. No intracranial aneurysm. Review of the MIP images confirms the above findings CT Brain Perfusion Findings: ASPECTS: 10 CBF (<30%) Volume: 0mL Perfusion (Tmax>6.0s) volume: 0mL Mismatch Volume: 0mL Infarction Location:Negative CT perfusion for acute core infarct or other perfusion deficit. IMPRESSION: CTA HEAD AND NECK IMPRESSION: 1. Negative CTA for emergent large vessel occlusion. 2. 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 3. Single short-segment severe distal right ACA stenosis as above. 4. Additional moderate atheromatous disease about the major arterial vasculature of the head and neck as above. No other hemodynamically significant or correctable stenosis. CT PERFUSION IMPRESSION: Negative CT perfusion. No evidence for acute core infarct or other perfusion deficit. CT CERVICAL SPINE IMPRESSION: No acute traumatic injury within the cervical spine. These results were communicated to Dr. Wilford Corner at approximately 9:00 pmon 6/6/2021by text page via the [facility] messaging system. Electronically Signed   By: Rise Mu M.D.   On: 10/01/2019 21:50   ECHOCARDIOGRAM COMPLETE  Result Date: 10/02/2019    ECHOCARDIOGRAM REPORT   Patient Name:   KORALINE PHILLIPSON Date of Exam: 10/02/2019 Medical Rec #:  [id number]      Height:       61.0 in Accession #:    [id number]     Weight:  233.2 lb Date of Birth:  [birth year]      BSA:          2.016 m Patient Age:    69 years       BP:           123/72 mmHg Patient Gender: F              HR:           90 bpm. Exam Location:  Inpatient Procedure: 2D Echo, Color Doppler, Cardiac Doppler and Intracardiac            Opacification Agent Indications:    Stroke i163.9  History:        Patient has no prior  history of Echocardiogram examinations.  Sonographer:    Irving Burton Senior RDCS Referring Phys: [id number] VASUNDHRA RATHORE IMPRESSIONS  1. There appears to be an oblong 0.5 x 0.75 cm mobile filling defect in the LV apex, which is severely hypokinetic/aneurysmal, consistent with thrombus.. Left ventricular ejection fraction, by estimation, is 60 to 65%. The left ventricle has normal function. The left ventricle demonstrates regional wall motion abnormalities (see scoring diagram/findings for description). Left ventricular diastolic parameters are consistent with Grade I diastolic dysfunction (impaired relaxation). There is severe akinesis of the left ventricular, apical apical segment.  2. Right ventricular systolic function is normal. The right ventricular size is normal. There is normal pulmonary artery systolic pressure. The estimated right ventricular systolic pressure is 28.8 mmHg.  3. The mitral valve is grossly normal. Trivial mitral valve regurgitation.  4. The aortic valve is tricuspid. Aortic valve regurgitation is not visualized.  5. The inferior vena cava is normal in size with greater than 50% respiratory variability, suggesting right atrial pressure of 3 mmHg. Conclusion(s)/Recommendation(s): LV Apical thrombus is noted, anticoagulation is recommended per current guidelines. FINDINGS  Left Ventricle: There appears to be an oblong 0.5 x 0.75 cm mobile filling defect in the LV apex, which is severely hypokinetic/aneurysmal, consistent with thrombus. Left ventricular ejection fraction, by estimation, is 60 to 65%. The left ventricle has  normal function. The left ventricle demonstrates regional wall motion abnormalities. Severe akinesis of the left ventricular, apical apical segment. Definity contrast agent was given IV to delineate the left ventricular endocardial borders. The left ventricular internal cavity size was normal in size. There is no left ventricular hypertrophy. Left ventricular diastolic  parameters are consistent with Grade I diastolic dysfunction (impaired relaxation). Indeterminate filling pressures. Right Ventricle: The right ventricular size is normal. No increase in right ventricular wall thickness. Right ventricular systolic function is normal. There is normal pulmonary artery systolic pressure. The tricuspid regurgitant velocity is 2.54 m/s, and  with an assumed right atrial pressure of 3 mmHg, the estimated right ventricular systolic pressure is 28.8 mmHg. Left Atrium: Left atrial size was normal in size. Right Atrium: Right atrial size was normal in size. Pericardium: There is no evidence of pericardial effusion. Mitral Valve: The mitral valve is grossly normal. Trivial mitral valve regurgitation. Tricuspid Valve: The tricuspid valve is grossly normal. Tricuspid valve regurgitation is trivial. Aortic Valve: The aortic valve is tricuspid. Aortic valve regurgitation is not visualized. Pulmonic Valve: The pulmonic valve was normal in structure. Pulmonic valve regurgitation is not visualized. Aorta: The aortic root and ascending aorta are structurally normal, with no evidence of dilitation. Venous: The inferior vena cava is normal in size with greater than 50% respiratory variability, suggesting right atrial pressure of 3 mmHg. IAS/Shunts: No atrial level shunt detected by color flow  Doppler.  LEFT VENTRICLE PLAX 2D LVIDd:         4.00 cm  Diastology LVIDs:         2.50 cm  LV e' lateral:   6.64 cm/s LV PW:         1.00 cm  LV E/e' lateral: 10.6 LV IVS:        1.00 cm  LV e' medial:    5.66 cm/s LVOT diam:     2.00 cm  LV E/e' medial:  12.4 LV SV:         63 LV SV Index:   31 LVOT Area:     3.14 cm  RIGHT VENTRICLE RV S prime:     15.40 cm/s TAPSE (M-mode): 2.0 cm LEFT ATRIUM             Index       RIGHT ATRIUM           Index LA diam:        3.60 cm 1.79 cm/m  RA Area:     12.30 cm LA Vol (A2C):   46.3 ml 22.96 ml/m RA Volume:   28.50 ml  14.13 ml/m LA Vol (A4C):   42.3 ml 20.98 ml/m LA  Biplane Vol: 44.1 ml 21.87 ml/m  AORTIC VALVE LVOT Vmax:   109.00 cm/s LVOT Vmean:  74.000 cm/s LVOT VTI:    0.201 m  AORTA Ao Root diam: 3.00 cm Ao Asc diam:  3.30 cm MITRAL VALVE                TRICUSPID VALVE MV Area (PHT): 3.63 cm     TR Peak grad:   25.8 mmHg MV Decel Time: 209 msec     TR Vmax:        254.00 cm/s MV E velocity: 70.30 cm/s MV A velocity: 112.00 cm/s  SHUNTS MV E/A ratio:  0.63         Systemic VTI:  0.20 m                             Systemic Diam: 2.00 cm Zoila Shutter MD Electronically signed by Zoila Shutter MD Signature Date/Time: 10/02/2019/3:48:08 PM    Final    CT HEAD CODE STROKE WO CONTRAST  Result Date: 10/01/2019 CLINICAL DATA:  Code stroke. Initial evaluation for acute ataxia, left-sided weakness, altered mental status. EXAM: CT HEAD WITHOUT CONTRAST TECHNIQUE: Contiguous axial images were obtained from the base of the skull through the vertex without intravenous contrast. COMPARISON:  None available. FINDINGS: Brain: Generalized age-related cerebral atrophy. Patchy and confluent hypodensity throughout the periventricular deep white matter both cerebral hemispheres, most consistent with chronic small vessel ischemic disease, advanced in nature. Chronic small microvascular ischemic changes noted involving the deep gray nuclei and pons as well. Focal encephalomalacia within the right parieto-occipital region compatible with a chronic ischemic infarct. No acute intracranial hemorrhage. No acute large vessel territory infarct. No mass lesion, midline shift or mass effect. No hydrocephalus or extra-axial fluid collection. Vascular: No hyperdense vessel. Scattered vascular calcifications noted within the carotid siphons. Skull: Scalp soft tissues demonstrate no acute finding. Calvarium intact. Sinuses/Orbits: Globes and orbital soft tissues within normal limits. Left maxillary sinus retention cyst noted. Scattered mucosal thickening noted throughout the sphenoid ethmoidal sinuses, with  superimposed air-fluid level within the right sphenoid sinus. Trace left mastoid effusion. Other: None. ASPECTS [facility] Stroke Program Early CT Score) - Ganglionic level infarction (caudate,  lentiform nuclei, internal capsule, insula, M1-M3 cortex): 7 - Supraganglionic infarction (M4-M6 cortex): 3 Total score (0-10 with 10 being normal): 10 IMPRESSION: 1. No acute intracranial infarct or other abnormality. 2. ASPECTS is 10. 3. Chronic right parieto-occipital infarct. 4. Underlying age-related cerebral atrophy with advanced chronic microvascular ischemic disease. Critical Value/emergent results were called by telephone at the time of interpretation on 10/01/2019 at 7:57 pm to provider Dr. Rory Percy, Who verbally acknowledged these results. Electronically Signed   By: Jeannine Boga M.D.   On: 10/01/2019 20:10     Medical Consultants:    None.  Anti-Infectives:   None  Subjective:    [name] Good today she relates that she feels better than yesterday.  Objective:    Vitals:   10/02/19 1932 10/02/19 2337 10/03/19 0346 10/03/19 0921  BP: 123/87 (!) 149/55 131/72 118/60  Pulse: 91 83 85 87  Resp: 18 18 18 17   Temp: 98.9 F (37.2 C) 98.8 F (37.1 C) 98.1 F (36.7 C) 97.7 F (36.5 C)  TempSrc: Oral Oral Oral Oral  SpO2: 99% 99% 97% 97%  Weight:      Height:       SpO2: 97 %   Intake/Output Summary (Last 24 hours) at 10/03/2019 1031 Last data filed at 10/03/2019 0700 Gross per 24 hour  Intake --  Output 900 ml  Net -900 ml   Filed Weights   10/02/19 0252  Weight: 105.8 kg    Exam: General exam: In no acute distress. Respiratory system: Good air movement and clear to auscultation. Cardiovascular system: S1 & S2 heard, RRR. No JVD. Gastrointestinal system: Abdomen is nondistended, soft and nontender.  Extremities: No pedal edema. Skin: No rashes, lesions or ulcers Psychiatry: Judgement and insight appear normal. Mood & affect appropriate.  Data Reviewed:     Labs: Basic Metabolic Panel: Recent Labs  Lab 10/01/19 2056 10/01/19 2056 10/01/19 2105 10/02/19 0630  NA 138  --  140 140  K 2.3*   < > 2.3* 3.5  CL 102  --  102 107  CO2 22  --   --  23  GLUCOSE 106*  --  101* 107*  BUN 14  --  15 8  CREATININE 1.08*  --  0.80 0.71  CALCIUM 8.9  --   --  8.5*  MG  --   --   --  1.7   < > = values in this interval not displayed.   GFR Estimated Creatinine Clearance: 73.3 mL/min (by C-G formula based on SCr of 0.71 mg/dL). Liver Function Tests: Recent Labs  Lab 10/01/19 2056  AST 35  ALT 23  ALKPHOS 88  BILITOT 1.0  PROT 6.1*  ALBUMIN 3.5   No results for input(s): LIPASE, AMYLASE in the last 168 hours. No results for input(s): AMMONIA in the last 168 hours. Coagulation profile Recent Labs  Lab 10/01/19 2056  INR 1.1   COVID-19 Labs  No results for input(s): DDIMER, FERRITIN, LDH, CRP in the last 72 hours.  No results found for: SARSCOV2NAA  CBC: Recent Labs  Lab 10/01/19 2056 10/01/19 2105  WBC 7.3  --   NEUTROABS 4.8  --   HGB 14.0 13.6  HCT 40.7 40.0  MCV 91.5  --   PLT 158  --    Cardiac Enzymes: No results for input(s): CKTOTAL, CKMB, CKMBINDEX, TROPONINI in the last 168 hours. BNP (last 3 results) No results for input(s): PROBNP in the last 8760 hours. CBG: Recent Labs  Lab  10/01/19 2256  GLUCAP 97   D-Dimer: No results for input(s): DDIMER in the last 72 hours. Hgb A1c: Recent Labs    10/02/19 0630  HGBA1C 5.7*   Lipid Profile: Recent Labs    10/02/19 0630  CHOL 176  HDL 38*  LDLCALC 118*  TRIG 102  CHOLHDL 4.6   Thyroid function studies: No results for input(s): TSH, T4TOTAL, T3FREE, THYROIDAB in the last 72 hours.  Invalid input(s): FREET3 Anemia work up: No results for input(s): VITAMINB12, FOLATE, FERRITIN, TIBC, IRON, RETICCTPCT in the last 72 hours. Sepsis Labs: Recent Labs  Lab 10/01/19 2056  WBC 7.3   Microbiology No results found for this or any previous visit  (from the past 240 hour(s)).   Medications:   .  stroke: mapping our early stages of recovery book   Does not apply Once  . aspirin EC  81 mg Oral Daily  . atorvastatin  80 mg Oral Daily  . clopidogrel  75 mg Oral Daily  . enoxaparin (LOVENOX) injection  40 mg Subcutaneous Q24H  . potassium chloride  20 mEq Oral BID   Continuous Infusions:     LOS: 2 days   Marinda Elk  [facility]  10/03/2019, 10:31 AM

## 2019-10-03 NOTE — Consult Note (Signed)
Physical Medicine and Rehabilitation Consult Reason for Consult: Left-sided weakness with slurred speech Referring Physician: [facility]   HPI: [name] Good is a 69 y.o. right-handed female with history of depression, CAD, lumbar spine fusion, RLS, quit smoking 6 years ago.  Per chart review patient lives with spouse independent with assistive device.  Mobile home with 3 steps to entry.  Presented 10/01/2019 with left-sided weakness and slurred speech.  Cranial CT scan negative for acute changes.  Chronic right parieto-occipital infarction.  CT angiogram of head and neck negative for emergent large vessel occlusion.  MRI of the brain showed a 13 mm acute ischemic nonhemorrhagic right thalamic infarction.  Echocardiogram with ejection fraction of 65% grade 1 diastolic dysfunction as well as LV thrombus.  Admission chemistries with potassium 2.3, creatinine 1.08.  Initially maintained on aspirin and Plavix for CVA prophylaxis and changed to Eliquis after findings of LV thrombus- Cardiologist came to speak to pt while doing consult for admission.  Therapy evaluations completed with recommendations of physical medicine rehab consult.  Pt reports she's a little constipated- LBM was Saturday- didn't eat much, but was hard to TRY and go  yrs old  . LUMBAR LAMINECTOMY/DECOMPRESSION MICRODISCECTOMY N/A 06/14/2015   Procedure: Lumbar Three-Four,Lumbar Four-Five, Lumbar Five-Sacral One Laminectomy;  Surgeon: Tia Alert, MD;  Location: [facility];  Service: Neurosurgery;  Laterality: N/A;  . LUMBAR WOUND DEBRIDEMENT N/A 07/24/2015   Procedure: lumbar wound revision;  Surgeon: Tia Alert, MD;  Location: [facility];  Service: Neurosurgery;  Laterality: N/A;  . TOTAL HIP ARTHROPLASTY Right 10/25/2015   Procedure: RIGHT TOTAL HIP ARTHROPLASTY ANTERIOR APPROACH;  Surgeon: Kathryne Hitch, MD;  Location: [facility];  Service: Orthopedics;  Laterality: Right;  . TUBAL LIGATION     History reviewed. No pertinent family history. Social History:  reports that she quit smoking about 6 years ago. Her smoking use included cigarettes. She has a 80.00 pack-year smoking history. She has never used smokeless tobacco. She reports current alcohol use. She reports that she does not use drugs. Allergies:  Allergies  Allergen Reactions  . Amoxicillin Other (See Comments)  Tolerated Zosyn Has patient had a PCN reaction causing immediate rash, facial/tongue/throat swelling, SOB or lightheadedness with hypotension: No Has patient had a PCN reaction causing severe rash involving mucus membranes or skin necrosis: No Has patient had a PCN reaction that required  hospitalization No Has patient had a PCN reaction occurring within the last 10 years: No If all of the above answers are "NO", then may proceed with Cephalosporin use.   Medications Prior to Admission  Medication Sig Dispense Refill  . calcium carbonate (TUMS EX) 750 MG chewable tablet Chew 1 tablet by mouth 2 (two) times daily as needed for heartburn.    . fluticasone (FLONASE) 50 MCG/ACT nasal spray Place 2 sprays into both nostrils daily as needed.    Marland Kitchen ibuprofen (ADVIL) 200 MG tablet Take 400-600 mg by mouth every 6 (six) hours as needed.    Marland Kitchen oxyCODONE-acetaminophen (PERCOCET) 7.5-325 MG tablet Take 1 tablet by mouth 4 (four) times daily as needed for pain.    . phentermine (ADIPEX-P) 37.5 MG tablet Take 37.5 mg by mouth daily as needed (appetite).     . Pseudoephedrine HCl (SUDAFED PO) Take 1 tablet by mouth daily as needed (Allergies).    Marland Kitchen rOPINIRole (REQUIP) 3 MG tablet Take 3 mg by mouth at bedtime as needed (restless leg).     Marland Kitchen tetrahydrozoline 0.05 % ophthalmic solution Place 1 drop into both eyes daily as needed (Dry eyes).    Marland Kitchen aspirin 325 MG EC tablet Take 1 tablet (325 mg total) by mouth daily. (Patient not taking: Reported on 10/02/2019) 30 tablet 0  . doxycycline (VIBRAMYCIN) 100 MG capsule Take 1 capsule (100 mg total) by mouth 2 (two) times daily. (Patient not taking: Reported on 10/02/2019) 12 capsule 0  . methocarbamol (ROBAXIN) 500 MG tablet Take 1 tablet (500 mg total) by mouth every 6 (six) hours as needed for muscle spasms. (Patient not taking: Reported on 10/02/2019) 60 tablet 1  . methylPREDNISolone (MEDROL) 4 MG tablet Medrol dose pack. Take as instructed (Patient not taking: Reported on 10/02/2019) 21 tablet 0  . predniSONE (DELTASONE) 5 MG tablet Take 1 tablet (5 mg total) by mouth daily with breakfast. (Patient not taking: Reported on 10/02/2019) 30 tablet 1    Home: Home Living Family/patient expects to be discharged to:: Private residence Living Arrangements:  Spouse/significant other Available Help at Discharge: Family(spouse works, granddaughter can help?) Type of Home: Mobile home Home Access: Stairs to enter Secretary/administrator of Steps: 3 Entrance Stairs-Rails: Left Home Layout: One level Bathroom Shower/Tub: Engineer, manufacturing systems: Handicapped height Bathroom Accessibility: Yes Home Equipment: Cane - single point, Environmental consultant - 4 wheels, Other (comment)(toilet extension) Additional Comments: question accuracy, some info taken from 09/2017 record  Functional History: Prior Function Level of Independence: Independent with assistive device(s) Comments: per 09/2017 record she used cane or rollator when outside home/in community, pt reports driving and independent with ADLs Functional Status:  Mobility: Bed Mobility Overal bed mobility: Needs Assistance Bed Mobility: Rolling, Sidelying to Sit, Sit to Supine Rolling: Min guard(to her left; right NT) Sidelying to sit: Min assist Sit to supine: Min assist, +2 for safety/equipment General bed mobility comments: pt required assist to establish sitting EOB; return to bed she was clearly stuggling with spatial perception of where she and the bed are in space; ultimately needed assist to raise legs onto bed and 2nd person at head to protect from hitting against bed rails Transfers Overall transfer level: Needs assistance Equipment used: 2 person hand held  assist Transfers: Sit to/from Stand Sit to Stand: Mod assist, +2 physical assistance General transfer comment: however immediately leans posterior and requires +2 max assist to prevent posterior fall; with fatigue leaning towards her right and resisting weight shift over her RLE Ambulation/Gait General Gait Details: unable; in standing able to lift rt foot off the floor (with stable LLE) however could not begin to lift lt foot off floor due to inability to weight shift over RLE    ADL:    Cognition: Cognition Overall Cognitive  Status: Impaired/Different from baseline Orientation Level: (P) Oriented to person, Oriented to place, Oriented to time Cognition Arousal/Alertness: Lethargic(initally, improved during session) Behavior During Therapy: Anxious, Impulsive, Restless Overall Cognitive Status: Impaired/Different from baseline Area of Impairment: Attention, Safety/judgement, Awareness, Problem solving, Following commands Current Attention Level: Sustained, Focused Following Commands: Follows one step commands consistently, Follows one step commands with increased time, Follows multi-step commands inconsistently Safety/Judgement: Decreased awareness of safety, Decreased awareness of deficits Awareness: Intellectual Problem Solving: Difficulty sequencing, Requires verbal cues, Requires tactile cues, Decreased initiation General Comments: patient tangled up in phone/call bell cords initally, then when untangled patient reaching into midair reporting "these cords are just everywhere", pt reports needing to talk to her daughter therefore handed her the phone and pt talking to daughter without phone on, assisted to call daughter and patinet voiced "hey they are in here. I'll call you back". ; pt in constant motion, poor awareness and control of L UE, no awareness of deficits or need for assist   Blood pressure (!) 124/57, pulse 90, temperature 98.6 F (37 C), temperature source Oral, resp. rate 18, height 5\' 1"  (1.549 m), weight 105.8 kg, SpO2 97 %. Physical Exam  Nursing note and vitals reviewed. Constitutional: She appears well-developed and well-nourished. No distress.  Pt slightly confused; flat affect; daughter and RN at bedside, appropriate otherwise, sitting up in bed, NAD  HENT:  Head: Normocephalic and atraumatic.  Nose: Nose normal.  Mouth/Throat: Oropharynx is clear and moist. No oropharyngeal exudate.  Absent sensation on L face/vs decreased  Eyes: Conjunctivae are normal. Right eye exhibits no discharge.  Left eye exhibits no discharge. No scleral icterus.  EOMI but slow to left- R gaze preference- will only look left when specifically asked  Neck: No tracheal deviation present.  Cardiovascular:  Borderline tachycardia- regular rhythm-   Respiratory: No stridor.  CTA B/L- no W/R/R- good air movement  GI:  Soft, NT, (+)BS -hypoactive- nondistended  Musculoskeletal:     Cervical back: Normal range of motion and neck supple.     Comments: RUE- 5/5 in biceps, triceps, WE, grip  LUE- 4/5 in same muscles RLE_ HF, KE, KF, DF and PF 5/5 LLE- 4/5 in same muscles   Neurological: She is alert.  Patient is a bit lethargic but arousable.  She displays a right gaze preference and left neglect.  She does provide her name.  Limited medical historian. Daughter at bedside- reports pt is "a little confused". Pt Ox2- to self and location, not date/day Absent sensation/abnormal sensation in LUE/LLE No clonus; no Hoffman's B/L  L neglect- wouldn't look or use L side unless asked specifically to LOOK at left hand- kept trying to reach with R hand  Skin: Skin is warm and dry.  IV LUE- forearm- looks good  Psychiatric:  Flat affect-     No results found for this or any previous visit (from the past 24 hour(s)). CT Code Stroke CTA Head W/WO contrast  Result Date: 10/01/2019 CLINICAL  DATA:  Initial evaluation for acute stroke, left-sided weakness, altered mental status. EXAM: CT ANGIOGRAPHY HEAD AND NECK CT PERFUSION BRAIN TECHNIQUE: Multidetector CT imaging of the head and neck was performed using the standard protocol during bolus administration of intravenous contrast. Multiplanar CT image reconstructions and MIPs were obtained to evaluate the vascular anatomy. Carotid stenosis measurements (when applicable) are obtained utilizing NASCET criteria, using the distal internal carotid diameter as the denominator. Multiphase CT imaging of the brain was performed following IV bolus contrast injection. Subsequent  parametric perfusion maps were calculated using RAPID software. CONTRAST:  OMNIPAQUE IOHEXOL 350 MG/ML SOLN COMPARISON:  Comparison made with prior noncontrast head CT from earlier same day. FINDINGS: CTA NECK FINDINGS Aortic arch: Visualized aortic arch of normal caliber with normal branch pattern. Moderate atheromatous plaque seen about the arch and origin of the great vessels without hemodynamically significant stenosis. Note made of soft plaque protruding into the lumen of the distal arch/proximal intrathoracic aorta (series 5, image 307). Visualized subclavian arteries widely patent. Right carotid system: Right common carotid artery patent from its origin to the bifurcation without flow-limiting stenosis. Scattered eccentric plaque about the right bifurcation without significant stenosis. Just distally within the proximal right ICA, additional focal plaque is seen with associated short-segment stenosis of up to 50% by NASCET criteria (series 5, image 169). Right ICA tortuous and medialized into the retropharyngeal space. Right ICA otherwise widely patent distally to the skull base without stenosis, dissection or occlusion. Left carotid system: Left common carotid artery patent from its origin to the bifurcation without stenosis. Scattered eccentric mixed plaque about the left bifurcation/proximal left ICA without hemodynamically significant stenosis. Proximal left ICA tortuous and medialized into the retropharyngeal space. Left ICA widely patent distally to the skull base without stenosis, dissection or occlusion. Vertebral arteries: Both vertebral arteries arise from the subclavian arteries. Strongly dominant right vertebral artery with a markedly hypoplastic left vertebral artery noted. Dominant right vertebral artery patent within the neck without stenosis, dissection, or occlusion. Markedly diminutive left vertebral artery patent as well without appreciable stenosis or other vascular abnormality.  Skeleton: Alignment: Straightening of the normal cervical lordosis. No listhesis or malalignment. Skull base and vertebrae: Skull base intact. Normal C1-2 articulations are preserved in the dens is intact. Vertebral body heights maintained. No acute fracture. Soft tissues and spinal canal: Soft tissues of the neck demonstrate no acute finding. No abnormal prevertebral edema. Spinal canal within normal limits. Disc levels: Mild multilevel cervical spondylosis without significant stenosis, most pronounced at C6-7. Upper chest: Visualized upper chest demonstrates no acute finding. Partially visualized lung apices are clear. Other: None. Other neck: No other acute soft tissue abnormality within the neck. No mass lesion or adenopathy. Subcentimeter calcification noted within the left lobe of thyroid. Upper chest: Visualized upper chest demonstrates no acute finding. Partially visualized lungs are grossly clear. Review of the MIP images confirms the above findings CTA HEAD FINDINGS Anterior circulation: Petrous segments widely patent bilaterally. Scattered atheromatous plaque throughout the carotid siphons with relatively mild multifocal stenosis. A1 segments patent bilaterally. Normal anterior communicating artery complex. Anterior cerebral arteries widely patent proximally. Short-segment severe distal right ACA stenosis noted (series 5, image 62). ACAs otherwise widely patent to their distal aspects. No M1 stenosis or occlusion. Normal MCA bifurcations. Distal MCA branches well perfused and symmetric. Posterior circulation: Dominant right vertebral artery patent to the vertebrobasilar junction without stenosis. Right PICA not visualized. Hypoplastic left vertebral artery patent to the vertebrobasilar junction as well. Left PICA patent. Basilar  patent to its distal aspect without stenosis. Superior cerebral arteries patent bilaterally. Left PCA supplied via the basilar. Right PCA supplied via a hypoplastic right P1  segment and robust right posterior communicating artery. Both PCAs well perfused to their distal aspects without stenosis. Venous sinuses: Patent. Anatomic variants: Predominant fetal type origin of the right PCA. No intracranial aneurysm. Review of the MIP images confirms the above findings CT Brain Perfusion Findings: ASPECTS: 10 CBF (<30%) Volume: 0mL Perfusion (Tmax>6.0s) volume: 0mL Mismatch Volume: 0mL Infarction Location:Negative CT perfusion for acute core infarct or other perfusion deficit. IMPRESSION: CTA HEAD AND NECK IMPRESSION: 1. Negative CTA for emergent large vessel occlusion. 2. 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 3. Single short-segment severe distal right ACA stenosis as above. 4. Additional moderate atheromatous disease about the major arterial vasculature of the head and neck as above. No other hemodynamically significant or correctable stenosis. CT PERFUSION IMPRESSION: Negative CT perfusion. No evidence for acute core infarct or other perfusion deficit. CT CERVICAL SPINE IMPRESSION: No acute traumatic injury within the cervical spine. These results were communicated to Dr. Wilford Corner at approximately 9:00 pmon 6/6/2021by text page via the [facility] messaging system. Electronically Signed   By: Rise Mu M.D.   On: 10/01/2019 21:50   CT Code Stroke CTA Neck W/WO contrast  Result Date: 10/01/2019 CLINICAL DATA:  Initial evaluation for acute stroke, left-sided weakness, altered mental status. EXAM: CT ANGIOGRAPHY HEAD AND NECK CT PERFUSION BRAIN TECHNIQUE: Multidetector CT imaging of the head and neck was performed using the standard protocol during bolus administration of intravenous contrast. Multiplanar CT image reconstructions and MIPs were obtained to evaluate the vascular anatomy. Carotid stenosis measurements (when applicable) are obtained utilizing NASCET criteria, using the distal internal carotid  diameter as the denominator. Multiphase CT imaging of the brain was performed following IV bolus contrast injection. Subsequent parametric perfusion maps were calculated using RAPID software. CONTRAST:  OMNIPAQUE IOHEXOL 350 MG/ML SOLN COMPARISON:  Comparison made with prior noncontrast head CT from earlier same day. FINDINGS: CTA NECK FINDINGS Aortic arch: Visualized aortic arch of normal caliber with normal branch pattern. Moderate atheromatous plaque seen about the arch and origin of the great vessels without hemodynamically significant stenosis. Note made of soft plaque protruding into the lumen of the distal arch/proximal intrathoracic aorta (series 5, image 307). Visualized subclavian arteries widely patent. Right carotid system: Right common carotid artery patent from its origin to the bifurcation without flow-limiting stenosis. Scattered eccentric plaque about the right bifurcation without significant stenosis. Just distally within the proximal right ICA, additional focal plaque is seen with associated short-segment stenosis of up to 50% by NASCET criteria (series 5, image 169). Right ICA tortuous and medialized into the retropharyngeal space. Right ICA otherwise widely patent distally to the skull base without stenosis, dissection or occlusion. Left carotid system: Left common carotid artery patent from its origin to the bifurcation without stenosis. Scattered eccentric mixed plaque about the left bifurcation/proximal left ICA without hemodynamically significant stenosis. Proximal left ICA tortuous and medialized into the retropharyngeal space. Left ICA widely patent distally to the skull base without stenosis, dissection or occlusion. Vertebral arteries: Both vertebral arteries arise from the subclavian arteries. Strongly dominant right vertebral artery with a markedly hypoplastic left vertebral artery noted. Dominant right vertebral artery patent within the neck without stenosis, dissection, or  occlusion. Markedly diminutive left vertebral artery patent as well without appreciable stenosis or other vascular abnormality. Skeleton:  Alignment: Straightening of the normal cervical lordosis. No listhesis or malalignment. Skull base and vertebrae: Skull base intact. Normal C1-2 articulations are preserved in the dens is intact. Vertebral body heights maintained. No acute fracture. Soft tissues and spinal canal: Soft tissues of the neck demonstrate no acute finding. No abnormal prevertebral edema. Spinal canal within normal limits. Disc levels: Mild multilevel cervical spondylosis without significant stenosis, most pronounced at C6-7. Upper chest: Visualized upper chest demonstrates no acute finding. Partially visualized lung apices are clear. Other: None. Other neck: No other acute soft tissue abnormality within the neck. No mass lesion or adenopathy. Subcentimeter calcification noted within the left lobe of thyroid. Upper chest: Visualized upper chest demonstrates no acute finding. Partially visualized lungs are grossly clear. Review of the MIP images confirms the above findings CTA HEAD FINDINGS Anterior circulation: Petrous segments widely patent bilaterally. Scattered atheromatous plaque throughout the carotid siphons with relatively mild multifocal stenosis. A1 segments patent bilaterally. Normal anterior communicating artery complex. Anterior cerebral arteries widely patent proximally. Short-segment severe distal right ACA stenosis noted (series 5, image 62). ACAs otherwise widely patent to their distal aspects. No M1 stenosis or occlusion. Normal MCA bifurcations. Distal MCA branches well perfused and symmetric. Posterior circulation: Dominant right vertebral artery patent to the vertebrobasilar junction without stenosis. Right PICA not visualized. Hypoplastic left vertebral artery patent to the vertebrobasilar junction as well. Left PICA patent. Basilar patent to its distal aspect without stenosis.  Superior cerebral arteries patent bilaterally. Left PCA supplied via the basilar. Right PCA supplied via a hypoplastic right P1 segment and robust right posterior communicating artery. Both PCAs well perfused to their distal aspects without stenosis. Venous sinuses: Patent. Anatomic variants: Predominant fetal type origin of the right PCA. No intracranial aneurysm. Review of the MIP images confirms the above findings CT Brain Perfusion Findings: ASPECTS: 10 CBF (<30%) Volume: 4mL Perfusion (Tmax>6.0s) volume: 20mL Mismatch Volume: 31mL Infarction Location:Negative CT perfusion for acute core infarct or other perfusion deficit. IMPRESSION: CTA HEAD AND NECK IMPRESSION: 1. Negative CTA for emergent large vessel occlusion. 2. 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 3. Single short-segment severe distal right ACA stenosis as above. 4. Additional moderate atheromatous disease about the major arterial vasculature of the head and neck as above. No other hemodynamically significant or correctable stenosis. CT PERFUSION IMPRESSION: Negative CT perfusion. No evidence for acute core infarct or other perfusion deficit. CT CERVICAL SPINE IMPRESSION: No acute traumatic injury within the cervical spine. These results were communicated to Dr. Wilford Corner at approximately 9:00 pmon 6/6/2021by text page via the [facility] messaging system. Electronically Signed   By: Rise Mu M.D.   On: 10/01/2019 21:50   MR BRAIN WO CONTRAST  Result Date: 10/02/2019 CLINICAL DATA:  Follow-up examination for acute stroke. EXAM: MRI HEAD WITHOUT CONTRAST TECHNIQUE: Multiplanar, multiecho pulse sequences of the brain and surrounding structures were obtained without intravenous contrast. COMPARISON:  Comparison made with prior CTs from 10/01/2019. FINDINGS: Brain: Diffuse prominence of the CSF containing spaces compatible with generalized age-related cerebral atrophy.  Patchy and confluent T2/FLAIR hyperintensity within the periventricular deep white matter both cerebral hemispheres most consistent with chronic small vessel ischemic disease. Patchy involvement of the deep gray nuclei and pons. Overall, appearance is moderate to advanced in nature. Superimposed remote cortical infarct at the right temporal occipital region. 13 mm focus of diffusion abnormality at the dorsal right thalamus compatible with an acute ischemic infarct (series 5, image 76).  Associated signal loss on corresponding ADC map (series 6, image 25). No associated hemorrhage or mass effect. No other evidence for acute or subacute ischemia. Gray-white matter differentiation otherwise maintained. No other areas of remote cortical infarction. No evidence for acute intracranial hemorrhage. No mass lesion, midline shift or mass effect. No hydrocephalus or extra-axial fluid collection. No made of a partially empty sella. Midline structures intact. Vascular: Major intracranial vascular flow voids are maintained. Skull and upper cervical spine: Craniocervical junction within normal limits. Bone marrow signal intensity normal. No scalp soft tissue abnormality. Sinuses/Orbits: Patient status post bilateral ocular lens replacement. Globes and orbital soft tissues demonstrate no acute finding. Scattered mucosal thickening noted throughout the paranasal sinuses with superimposed left maxillary sinus retention cyst. Trace left mastoid effusion noted, of doubtful significance. Inner ear structures grossly normal. Other: None. IMPRESSION: 1. 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 2. 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 3. Underlying age-related cerebral atrophy with moderate to advanced chronic microvascular ischemic disease. Electronically Signed   By: Rise Mu M.D.   On: 10/02/2019 02:39   CT C-SPINE NO CHARGE  Result Date: 10/01/2019 CLINICAL DATA:   Initial evaluation for acute stroke, left-sided weakness, altered mental status. EXAM: CT ANGIOGRAPHY HEAD AND NECK CT PERFUSION BRAIN TECHNIQUE: Multidetector CT imaging of the head and neck was performed using the standard protocol during bolus administration of intravenous contrast. Multiplanar CT image reconstructions and MIPs were obtained to evaluate the vascular anatomy. Carotid stenosis measurements (when applicable) are obtained utilizing NASCET criteria, using the distal internal carotid diameter as the denominator. Multiphase CT imaging of the brain was performed following IV bolus contrast injection. Subsequent parametric perfusion maps were calculated using RAPID software. CONTRAST:  OMNIPAQUE IOHEXOL 350 MG/ML SOLN COMPARISON:  Comparison made with prior noncontrast head CT from earlier same day. FINDINGS: CTA NECK FINDINGS Aortic arch: Visualized aortic arch of normal caliber with normal branch pattern. Moderate atheromatous plaque seen about the arch and origin of the great vessels without hemodynamically significant stenosis. Note made of soft plaque protruding into the lumen of the distal arch/proximal intrathoracic aorta (series 5, image 307). Visualized subclavian arteries widely patent. Right carotid system: Right common carotid artery patent from its origin to the bifurcation without flow-limiting stenosis. Scattered eccentric plaque about the right bifurcation without significant stenosis. Just distally within the proximal right ICA, additional focal plaque is seen with associated short-segment stenosis of up to 50% by NASCET criteria (series 5, image 169). Right ICA tortuous and medialized into the retropharyngeal space. Right ICA otherwise widely patent distally to the skull base without stenosis, dissection or occlusion. Left carotid system: Left common carotid artery patent from its origin to the bifurcation without stenosis. Scattered eccentric mixed plaque about the left  bifurcation/proximal left ICA without hemodynamically significant stenosis. Proximal left ICA tortuous and medialized into the retropharyngeal space. Left ICA widely patent distally to the skull base without stenosis, dissection or occlusion. Vertebral arteries: Both vertebral arteries arise from the subclavian arteries. Strongly dominant right vertebral artery with a markedly hypoplastic left vertebral artery noted. Dominant right vertebral artery patent within the neck without stenosis, dissection, or occlusion. Markedly diminutive left vertebral artery patent as well without appreciable stenosis or other vascular abnormality. Skeleton: Alignment: Straightening of the normal cervical lordosis. No listhesis or malalignment. Skull base and vertebrae: Skull base intact. Normal C1-2 articulations are preserved in the dens is intact. Vertebral body heights maintained. No acute fracture. Soft tissues and spinal canal:  Soft tissues of the neck demonstrate no acute finding. No abnormal prevertebral edema. Spinal canal within normal limits. Disc levels: Mild multilevel cervical spondylosis without significant stenosis, most pronounced at C6-7. Upper chest: Visualized upper chest demonstrates no acute finding. Partially visualized lung apices are clear. Other: None. Other neck: No other acute soft tissue abnormality within the neck. No mass lesion or adenopathy. Subcentimeter calcification noted within the left lobe of thyroid. Upper chest: Visualized upper chest demonstrates no acute finding. Partially visualized lungs are grossly clear. Review of the MIP images confirms the above findings CTA HEAD FINDINGS Anterior circulation: Petrous segments widely patent bilaterally. Scattered atheromatous plaque throughout the carotid siphons with relatively mild multifocal stenosis. A1 segments patent bilaterally. Normal anterior communicating artery complex. Anterior cerebral arteries widely patent proximally. Short-segment severe  distal right ACA stenosis noted (series 5, image 62). ACAs otherwise widely patent to their distal aspects. No M1 stenosis or occlusion. Normal MCA bifurcations. Distal MCA branches well perfused and symmetric. Posterior circulation: Dominant right vertebral artery patent to the vertebrobasilar junction without stenosis. Right PICA not visualized. Hypoplastic left vertebral artery patent to the vertebrobasilar junction as well. Left PICA patent. Basilar patent to its distal aspect without stenosis. Superior cerebral arteries patent bilaterally. Left PCA supplied via the basilar. Right PCA supplied via a hypoplastic right P1 segment and robust right posterior communicating artery. Both PCAs well perfused to their distal aspects without stenosis. Venous sinuses: Patent. Anatomic variants: Predominant fetal type origin of the right PCA. No intracranial aneurysm. Review of the MIP images confirms the above findings CT Brain Perfusion Findings: ASPECTS: 10 CBF (<30%) Volume: 0mL Perfusion (Tmax>6.0s) volume: 0mL Mismatch Volume: 0mL Infarction Location:Negative CT perfusion for acute core infarct or other perfusion deficit. IMPRESSION: CTA HEAD AND NECK IMPRESSION: 1. Negative CTA for emergent large vessel occlusion. 2. 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 3. Single short-segment severe distal right ACA stenosis as above. 4. Additional moderate atheromatous disease about the major arterial vasculature of the head and neck as above. No other hemodynamically significant or correctable stenosis. CT PERFUSION IMPRESSION: Negative CT perfusion. No evidence for acute core infarct or other perfusion deficit. CT CERVICAL SPINE IMPRESSION: No acute traumatic injury within the cervical spine. These results were communicated to Dr. Wilford Corner at approximately 9:00 pmon 6/6/2021by text page via the [facility] messaging system. Electronically Signed   By:  Rise Mu M.D.   On: 10/01/2019 21:50   CT Code Stroke Cerebral Perfusion with contrast  Result Date: 10/01/2019 CLINICAL DATA:  Initial evaluation for acute stroke, left-sided weakness, altered mental status. EXAM: CT ANGIOGRAPHY HEAD AND NECK CT PERFUSION BRAIN TECHNIQUE: Multidetector CT imaging of the head and neck was performed using the standard protocol during bolus administration of intravenous contrast. Multiplanar CT image reconstructions and MIPs were obtained to evaluate the vascular anatomy. Carotid stenosis measurements (when applicable) are obtained utilizing NASCET criteria, using the distal internal carotid diameter as the denominator. Multiphase CT imaging of the brain was performed following IV bolus contrast injection. Subsequent parametric perfusion maps were calculated using RAPID software. CONTRAST:  OMNIPAQUE IOHEXOL 350 MG/ML SOLN COMPARISON:  Comparison made with prior noncontrast head CT from earlier same day. FINDINGS: CTA NECK FINDINGS Aortic arch: Visualized aortic arch of normal caliber with normal branch pattern. Moderate atheromatous plaque seen about the arch and origin of the great vessels without hemodynamically significant stenosis. Note made of soft plaque protruding into the lumen  of the distal arch/proximal intrathoracic aorta (series 5, image 307). Visualized subclavian arteries widely patent. Right carotid system: Right common carotid artery patent from its origin to the bifurcation without flow-limiting stenosis. Scattered eccentric plaque about the right bifurcation without significant stenosis. Just distally within the proximal right ICA, additional focal plaque is seen with associated short-segment stenosis of up to 50% by NASCET criteria (series 5, image 169). Right ICA tortuous and medialized into the retropharyngeal space. Right ICA otherwise widely patent distally to the skull base without stenosis, dissection or occlusion. Left carotid system:  Left common carotid artery patent from its origin to the bifurcation without stenosis. Scattered eccentric mixed plaque about the left bifurcation/proximal left ICA without hemodynamically significant stenosis. Proximal left ICA tortuous and medialized into the retropharyngeal space. Left ICA widely patent distally to the skull base without stenosis, dissection or occlusion. Vertebral arteries: Both vertebral arteries arise from the subclavian arteries. Strongly dominant right vertebral artery with a markedly hypoplastic left vertebral artery noted. Dominant right vertebral artery patent within the neck without stenosis, dissection, or occlusion. Markedly diminutive left vertebral artery patent as well without appreciable stenosis or other vascular abnormality. Skeleton: Alignment: Straightening of the normal cervical lordosis. No listhesis or malalignment. Skull base and vertebrae: Skull base intact. Normal C1-2 articulations are preserved in the dens is intact. Vertebral body heights maintained. No acute fracture. Soft tissues and spinal canal: Soft tissues of the neck demonstrate no acute finding. No abnormal prevertebral edema. Spinal canal within normal limits. Disc levels: Mild multilevel cervical spondylosis without significant stenosis, most pronounced at C6-7. Upper chest: Visualized upper chest demonstrates no acute finding. Partially visualized lung apices are clear. Other: None. Other neck: No other acute soft tissue abnormality within the neck. No mass lesion or adenopathy. Subcentimeter calcification noted within the left lobe of thyroid. Upper chest: Visualized upper chest demonstrates no acute finding. Partially visualized lungs are grossly clear. Review of the MIP images confirms the above findings CTA HEAD FINDINGS Anterior circulation: Petrous segments widely patent bilaterally. Scattered atheromatous plaque throughout the carotid siphons with relatively mild multifocal stenosis. A1 segments  patent bilaterally. Normal anterior communicating artery complex. Anterior cerebral arteries widely patent proximally. Short-segment severe distal right ACA stenosis noted (series 5, image 62). ACAs otherwise widely patent to their distal aspects. No M1 stenosis or occlusion. Normal MCA bifurcations. Distal MCA branches well perfused and symmetric. Posterior circulation: Dominant right vertebral artery patent to the vertebrobasilar junction without stenosis. Right PICA not visualized. Hypoplastic left vertebral artery patent to the vertebrobasilar junction as well. Left PICA patent. Basilar patent to its distal aspect without stenosis. Superior cerebral arteries patent bilaterally. Left PCA supplied via the basilar. Right PCA supplied via a hypoplastic right P1 segment and robust right posterior communicating artery. Both PCAs well perfused to their distal aspects without stenosis. Venous sinuses: Patent. Anatomic variants: Predominant fetal type origin of the right PCA. No intracranial aneurysm. Review of the MIP images confirms the above findings CT Brain Perfusion Findings: ASPECTS: 10 CBF (<30%) Volume: 82mL Perfusion (Tmax>6.0s) volume: 71mL Mismatch Volume: 52mL Infarction Location:Negative CT perfusion for acute core infarct or other perfusion deficit. IMPRESSION: CTA HEAD AND NECK IMPRESSION: 1. Negative CTA for emergent large vessel occlusion. 2. 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 3. Single short-segment severe distal right ACA stenosis as above. 4. Additional moderate atheromatous disease about the major arterial vasculature of the head and neck as above. No other  hemodynamically significant or correctable stenosis. CT PERFUSION IMPRESSION: Negative CT perfusion. No evidence for acute core infarct or other perfusion deficit. CT CERVICAL SPINE IMPRESSION: No acute traumatic injury within the cervical spine. These results were  communicated to Dr. Wilford Corner at approximately 9:00 pmon 6/6/2021by text page via the [facility] messaging system. Electronically Signed   By: Rise Mu M.D.   On: 10/01/2019 21:50   ECHOCARDIOGRAM COMPLETE  Result Date: 10/02/2019    ECHOCARDIOGRAM REPORT   Patient Name:   [name] Good Date of Exam: 10/02/2019 Medical Rec #:  [id number]      Height:       61.0 in Accession #:    [id number]     Weight:       233.2 lb Date of Birth:  Jul 26, 1950      BSA:          2.016 m Patient Age:    69 years       BP:           123/72 mmHg Patient Gender: F              HR:           90 bpm. Exam Location:  Inpatient Procedure: 2D Echo, Color Doppler, Cardiac Doppler and Intracardiac            Opacification Agent Indications:    Stroke i163.9  History:        Patient has no prior history of Echocardiogram examinations.  Sonographer:    Irving Burton Senior RDCS Referring Phys: [id number] VASUNDHRA RATHORE IMPRESSIONS  1. There appears to be an oblong 0.5 x 0.75 cm mobile filling defect in the LV apex, which is severely hypokinetic/aneurysmal, consistent with thrombus.. Left ventricular ejection fraction, by estimation, is 60 to 65%. The left ventricle has normal function. The left ventricle demonstrates regional wall motion abnormalities (see scoring diagram/findings for description). Left ventricular diastolic parameters are consistent with Grade I diastolic dysfunction (impaired relaxation). There is severe akinesis of the left ventricular, apical apical segment.  2. Right ventricular systolic function is normal. The right ventricular size is normal. There is normal pulmonary artery systolic pressure. The estimated right ventricular systolic pressure is 28.8 mmHg.  3. The mitral valve is grossly normal. Trivial mitral valve regurgitation.  4. The aortic valve is tricuspid. Aortic valve regurgitation is not visualized.  5. The inferior vena cava is normal in size with greater than 50% respiratory variability, suggesting right atrial  pressure of 3 mmHg. Conclusion(s)/Recommendation(s): LV Apical thrombus is noted, anticoagulation is recommended per current guidelines. FINDINGS  Left Ventricle: There appears to be an oblong 0.5 x 0.75 cm mobile filling defect in the LV apex, which is severely hypokinetic/aneurysmal, consistent with thrombus. Left ventricular ejection fraction, by estimation, is 60 to 65%. The left ventricle has  normal function. The left ventricle demonstrates regional wall motion abnormalities. Severe akinesis of the left ventricular, apical apical segment. Definity contrast agent was given IV to delineate the left ventricular endocardial borders. The left ventricular internal cavity size was normal in size. There is no left ventricular hypertrophy. Left ventricular diastolic parameters are consistent with Grade I diastolic dysfunction (impaired relaxation). Indeterminate filling pressures. Right Ventricle: The right ventricular size is normal. No increase in right ventricular wall thickness. Right ventricular systolic function is normal. There is normal pulmonary artery systolic pressure. The tricuspid regurgitant velocity is 2.54 m/s, and  with an assumed right atrial pressure of 3 mmHg, the estimated right ventricular systolic pressure  is 28.8 mmHg. Left Atrium: Left atrial size was normal in size. Right Atrium: Right atrial size was normal in size. Pericardium: There is no evidence of pericardial effusion. Mitral Valve: The mitral valve is grossly normal. Trivial mitral valve regurgitation. Tricuspid Valve: The tricuspid valve is grossly normal. Tricuspid valve regurgitation is trivial. Aortic Valve: The aortic valve is tricuspid. Aortic valve regurgitation is not visualized. Pulmonic Valve: The pulmonic valve was normal in structure. Pulmonic valve regurgitation is not visualized. Aorta: The aortic root and ascending aorta are structurally normal, with no evidence of dilitation. Venous: The inferior vena cava is normal in  size with greater than 50% respiratory variability, suggesting right atrial pressure of 3 mmHg. IAS/Shunts: No atrial level shunt detected by color flow Doppler.  LEFT VENTRICLE PLAX 2D LVIDd:         4.00 cm  Diastology LVIDs:         2.50 cm  LV e' lateral:   6.64 cm/s LV PW:         1.00 cm  LV E/e' lateral: 10.6 LV IVS:        1.00 cm  LV e' medial:    5.66 cm/s LVOT diam:     2.00 cm  LV E/e' medial:  12.4 LV SV:         63 LV SV Index:   31 LVOT Area:     3.14 cm  RIGHT VENTRICLE RV S prime:     15.40 cm/s TAPSE (M-mode): 2.0 cm LEFT ATRIUM             Index       RIGHT ATRIUM           Index LA diam:        3.60 cm 1.79 cm/m  RA Area:     12.30 cm LA Vol (A2C):   46.3 ml 22.96 ml/m RA Volume:   28.50 ml  14.13 ml/m LA Vol (A4C):   42.3 ml 20.98 ml/m LA Biplane Vol: 44.1 ml 21.87 ml/m  AORTIC VALVE LVOT Vmax:   109.00 cm/s LVOT Vmean:  74.000 cm/s LVOT VTI:    0.201 m  AORTA Ao Root diam: 3.00 cm Ao Asc diam:  3.30 cm MITRAL VALVE                TRICUSPID VALVE MV Area (PHT): 3.63 cm     TR Peak grad:   25.8 mmHg MV Decel Time: 209 msec     TR Vmax:        254.00 cm/s MV E velocity: 70.30 cm/s MV A velocity: 112.00 cm/s  SHUNTS MV E/A ratio:  0.63         Systemic VTI:  0.20 m                             Systemic Diam: 2.00 cm Zoila Shutter MD Electronically signed by Zoila Shutter MD Signature Date/Time: 10/02/2019/3:48:08 PM    Final    CT HEAD CODE STROKE WO CONTRAST  Result Date: 10/01/2019 CLINICAL DATA:  Code stroke. Initial evaluation for acute ataxia, left-sided weakness, altered mental status. EXAM: CT HEAD WITHOUT CONTRAST TECHNIQUE: Contiguous axial images were obtained from the base of the skull through the vertex without intravenous contrast. COMPARISON:  None available. FINDINGS: Brain: Generalized age-related cerebral atrophy. Patchy and confluent hypodensity throughout the periventricular deep white matter both cerebral hemispheres, most consistent with chronic small vessel ischemic  disease,  advanced in nature. Chronic small microvascular ischemic changes noted involving the deep gray nuclei and pons as well. Focal encephalomalacia within the right parieto-occipital region compatible with a chronic ischemic infarct. No acute intracranial hemorrhage. No acute large vessel territory infarct. No mass lesion, midline shift or mass effect. No hydrocephalus or extra-axial fluid collection. Vascular: No hyperdense vessel. Scattered vascular calcifications noted within the carotid siphons. Skull: Scalp soft tissues demonstrate no acute finding. Calvarium intact. Sinuses/Orbits: Globes and orbital soft tissues within normal limits. Left maxillary sinus retention cyst noted. Scattered mucosal thickening noted throughout the sphenoid ethmoidal sinuses, with superimposed air-fluid level within the right sphenoid sinus. Trace left mastoid effusion. Other: None. ASPECTS [facility] Stroke Program Early CT Score) - Ganglionic level infarction (caudate, lentiform nuclei, internal capsule, insula, M1-M3 cortex): 7 - Supraganglionic infarction (M4-M6 cortex): 3 Total score (0-10 with 10 being normal): 10 IMPRESSION: 1. No acute intracranial infarct or other abnormality. 2. ASPECTS is 10. 3. Chronic right parieto-occipital infarct. 4. Underlying age-related cerebral atrophy with advanced chronic microvascular ischemic disease. Critical Value/emergent results were called by telephone at the time of interpretation on 10/01/2019 at 7:57 pm to provider Dr. Wilford Corner, Who verbally acknowledged these results. Electronically Signed   By: Rise Mu M.D.   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 Does the need for close, 24 hr/day medical supervision in concert with the patient's rehab needs make it unreasonable for this patient to be served in a less intensive setting? Yes 2. Co-Morbidities  requiring supervision/potential complications: depression, grief, hypokalemia, RLS, CAD, LV thrombus 3. Due to bowel management, safety, skin/wound care, disease management, medication administration, pain management and patient education, does the patient require 24 hr/day rehab nursing? Yes 4. Does the patient require coordinated care of a physician, rehab nurse, therapy disciplines of PT< OT, SLP to address physical and functional deficits in the context of the above medical diagnosis(es)? Yes Addressing deficits in the following areas: balance, endurance, locomotion, strength, transferring, bowel/bladder control, bathing, dressing, feeding, grooming, toileting, cognition and speech 5. Can the patient actively participate in an intensive therapy program of at least 3 hrs of therapy per day at least 5 days per week? Yes 6. The potential for patient to make measurable gains while on inpatient rehab is good 7. Anticipated functional outcomes upon discharge from inpatient rehab are supervision and min assist  with PT, supervision and min assist with OT, modified independent and supervision with SLP. 8. Estimated rehab length of stay to reach the above functional goals is: 2 weeks on average 9. Anticipated discharge destination: Home 10. Overall Rehab/Functional Prognosis: good  RECOMMENDATIONS: This patient's condition is appropriate for continued rehabilitative care in the following setting: CIR Patient has agreed to participate in recommended program. Potentially Note that insurance prior authorization may be required for reimbursement for recommended care.  Comment:  1. Pt really wants to come to inpt CIR- not SNF- will submit for inpt CIR via insurance approval.  2. Suggest tramadol or something for foot pain 3. Getting put on Apixiban for LV thrombus will also cover for DVT  prophyalxis 4. Cards already in room to discuss medical implications of LV thrombus 5. Is constipated- suggest  senokot-S for both laxative and stool softener 1 tab 2x/day for now- and maybe a dose of miralax to get pt to go.  6. Thank you for this consult   Reuel Boom  J Angiulli, PA-C 10/03/2019    I have personally performed a face to face diagnostic evaluation of this patient and formulated the key components of the plan.  Additionally, I have personally reviewed laboratory data, imaging studies, as well as relevant notes and concur with the physician assistant's documentation above.

## 2019-10-03 NOTE — Progress Notes (Signed)
Inpatient Rehabilitation Admissions Coordinator  Inpatient rehab consult received. I will begin insurance approval for possible CIR admit and meet with patient and family tomorrow for further discussions since Dr. Dahlia Client consultation. I have notified TOC team of plans.  Ottie Glazier, RN, MSN Rehab Admissions Coordinator [phone] 10/03/2019 5:30 PM

## 2019-10-03 NOTE — Progress Notes (Signed)
Initial Nutrition Assessment  DOCUMENTATION CODES:   Morbid obesity  INTERVENTION:  Boost Breeze po TID, each supplement provides 250 kcal and 9 grams of protein  Magic cup TID with meals, each supplement provides 290 kcal and 9 grams of protein  MVI daily  NUTRITION DIAGNOSIS:   Inadequate oral intake related to poor appetite as evidenced by per patient/family report.    GOAL:   Patient will meet greater than or equal to 90% of their needs    MONITOR:   Labs, PO intake, Supplement acceptance, I & O's  REASON FOR ASSESSMENT:   Malnutrition Screening Tool    ASSESSMENT:   Pt admitted with acute CVA. PMH significant for CAD, depression, MI,  GERD.  Pt states her appetite has been poor for about 2 months due to "feeling sad about my mother." Pt states that she has primarily been eating crackers and [place] Ice. Pt declines use of Ensure Enlive, but is agreeable to trying [employer] and [employer]. Given pt's prolonged poor intake, pt is at risk for malnutrition.   No previous wt history available since 2019. Pt states her UBW is ~ 200lbs, currently weighs 233.25 lbs.   No PO intake documented.   UOP: x24 hours I/O: -164.35ml since admit  Labs reviewed. Medications: Klor-con  NUTRITION - FOCUSED PHYSICAL EXAM:    Most Recent Value  Orbital Region  No depletion  Upper Arm Region  No depletion  Thoracic and Lumbar Region  No depletion  Buccal Region  No depletion  Temple Region  No depletion  Clavicle Bone Region  No depletion  Clavicle and Acromion Bone Region  No depletion  Scapular Bone Region  No depletion  Dorsal Hand  No depletion  Patellar Region  No depletion  Anterior Thigh Region  No depletion  Posterior Calf Region  No depletion  Edema (RD Assessment)  None  Hair  Reviewed  Eyes  Reviewed  Mouth  Reviewed  Skin  Reviewed  Nails  Reviewed       Diet Order:   Diet Order            Diet Heart Room service appropriate? Yes; Fluid  consistency: Thin  Diet effective now              EDUCATION NEEDS:   No education needs have been identified at this time  Last BM:  6/6  Height:   Ht Readings from Last 1 Encounters:  10/02/19 5\' 1"  (1.549 m)    Weight:   Wt Readings from Last 1 Encounters:  10/02/19 105.8 kg    Ideal Body Weight:  47.7 kg  BMI:  Body mass index is 44.07 kg/m.  Estimated Nutritional Needs:   Kcal:  1550-1750  Protein:  110-120 grams  Fluid:  >1.55L/d    12/02/19, MS, RD, LDN RD pager number and weekend/on-call pager number located in Amion.

## 2019-10-03 NOTE — Plan of Care (Signed)
  Problem: Education: Goal: Knowledge of disease or condition will improve Outcome: Progressing Goal: Knowledge of secondary prevention will improve Outcome: Progressing Goal: Knowledge of patient specific risk factors addressed and post discharge goals established will improve Outcome: Progressing Goal: Individualized Educational Video(s) Outcome: Progressing   Problem: Coping: Goal: Will verbalize positive feelings about self Outcome: Progressing Goal: Will identify appropriate support needs Outcome: Progressing   Problem: Health Behavior/Discharge Planning: Goal: Ability to manage health-related needs will improve Outcome: Progressing   Problem: Self-Care: Goal: Ability to participate in self-care as condition permits will improve Outcome: Progressing Goal: Verbalization of feelings and concerns over difficulty with self-care will improve Outcome: Progressing Goal: Ability to communicate needs accurately will improve Outcome: Progressing   Problem: Nutrition: Goal: Risk of aspiration will decrease Outcome: Progressing   Problem: Ischemic Stroke/TIA Tissue Perfusion: Goal: Complications of ischemic stroke/TIA will be minimized Outcome: Progressing   Problem: Education: Goal: Knowledge of disease or condition will improve Outcome: Progressing Goal: Knowledge of secondary prevention will improve Outcome: Progressing Goal: Knowledge of patient specific risk factors addressed and post discharge goals established will improve Outcome: Progressing Goal: Individualized Educational Video(s) Outcome: Progressing   Problem: Coping: Goal: Will verbalize positive feelings about self Outcome: Progressing Goal: Will identify appropriate support needs Outcome: Progressing   Problem: Health Behavior/Discharge Planning: Goal: Ability to manage health-related needs will improve Outcome: Progressing   Problem: Self-Care: Goal: Ability to participate in self-care as condition  permits will improve Outcome: Progressing Goal: Verbalization of feelings and concerns over difficulty with self-care will improve Outcome: Progressing Goal: Ability to communicate needs accurately will improve Outcome: Progressing   Problem: Nutrition: Goal: Risk of aspiration will decrease Outcome: Progressing   Problem: Ischemic Stroke/TIA Tissue Perfusion: Goal: Complications of ischemic stroke/TIA will be minimized Outcome: Progressing

## 2019-10-03 NOTE — TOC Initial Note (Cosign Needed)
Transition of Care [facility]) - Initial/Assessment Note    Patient Details  Name: [name] Good MRN: [id number] Date of Birth: May 25, 1950  Transition [facility]) CM/SW Contact:    Kirstie Peri, [place] Work Phone Number: 10/03/2019, 1:30 PM  Clinical Narrative:                 MSW Intern spoke with pt about discharge planning. Pt agreeable to go to SNF, but not [facility]. She noted that she has had both pfizer vaccines. She noted that her daughter would be visiting later and would let SW know if they had any other questions. FL2 and faxout will be completed. SW will continue to follow.  Expected Discharge Plan: Skilled Nursing Facility Barriers to Discharge: Continued Medical Work up, SNF Pending bed offer   Patient Goals and CMS Choice Patient states their goals for this hospitalization and ongoing recovery are:: Pt states she is agreeable to SNF, before returning home. CMS Medicare.gov Compare Post Acute Care list provided to:: Patient Choice offered to / list presented to : Patient  Expected Discharge Plan and Services Expected Discharge Plan: [place] In-house Referral: Clinical Social Work   Post Acute Care Choice: [place] Living arrangements for the past 2 months: [place]                                      Prior Living Arrangements/Services Living arrangements for the past 2 months: Single Family Home Lives with:: Spouse Patient language and need for interpreter reviewed:: Yes Do you feel safe going back to the place where you live?: Yes      Need for Family Participation in Patient Care: No (Comment) Care giver support system in place?: Yes (comment)   Criminal Activity/Legal Involvement Pertinent to Current Situation/Hospitalization: No - Comment as needed  Activities of Daily Living Home Assistive Devices/Equipment: Cane (specify quad or straight) ADL Screening (condition at time of  admission) Patient's cognitive ability adequate to safely complete daily activities?: Yes Is the patient deaf or have difficulty hearing?: No Does the patient have difficulty seeing, even when wearing glasses/contacts?: No Does the patient have difficulty concentrating, remembering, or making decisions?: No Patient able to express need for assistance with ADLs?: Yes Does the patient have difficulty dressing or bathing?: Yes Independently performs ADLs?: No Does the patient have difficulty walking or climbing stairs?: Yes Weakness of Legs: Left Weakness of Arms/Hands: Left  Permission Sought/Granted Permission sought to share information with : Family Supports Permission granted to share information with : Yes, Verbal Permission Granted  Share Information with NAME: [name] Good     Permission granted to share info w Relationship: Daughter  Permission granted to share info w Contact Information: [phone]  Emotional Assessment Appearance:: Appears stated age Attitude/Demeanor/Rapport: Engaged Affect (typically observed): Appropriate Orientation: : Oriented to Self, Oriented to Place, Oriented to  Time, Oriented to Situation Alcohol / Substance Use: Not Applicable Psych Involvement: No (comment)  Admission diagnosis:  Fall [W19.XXXA] Acute right MCA stroke ([place]) [I63.511] Left-sided weakness [R53.1] Acute CVA (cerebrovascular accident) [facility]) [I63.9] Patient Active Problem List   Diagnosis Date Noted  . Severe dehydration 10/02/2019  . Grief reaction 10/02/2019  . RLS (restless legs syndrome) 10/02/2019  . Acute CVA (cerebrovascular accident) ([place]) 10/01/2019  . Hypokalemia 10/01/2019  . Cellulitis of left upper extremity 10/18/2017  . Arthritis 10/18/2017  . Septic shock ([place])  10/16/2017  . Former smoker 11/13/2016  . History of restless legs syndrome 11/13/2016  . Nontraumatic incomplete tear of right rotator cuff 08/18/2016  . Autoimmune disease (HCC) 08/12/2016  .  Primary osteoarthritis of both knees 08/12/2016  . Primary osteoarthritis of both feet 08/12/2016  . Obesity (BMI 35.0-39.9 without comorbidity) 08/12/2016  . Osteoarthritis of right hip 10/25/2015  . 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 S/P lumbar laminectomy 06/14/2015   PCP:  Kaleen Mask, MD Pharmacy:   [facility] [place] - [address] [address] [address] [place] [address] [place] [address] Phone: [phone] Fax: [phone]     Social Determinants of Health (SDOH) Interventions    Readmission Risk Interventions No flowsheet data found.

## 2019-10-03 NOTE — Evaluation (Addendum)
Occupational Therapy Evaluation Patient Details Name: [name] Good MRN: [id number] DOB: [birth year] Today's Date: 10/03/2019    History of Present Illness Pt is a 69 y/o female with PMH of CAD, depression, arthritis, RLS, MI, prior lumbar fusion, R THA, presenting for acute onset on L sided weakness and slurred speech. CT negative for acute infarct. 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  Pt also found with severe hypokalemia and dehydration; noted recent loss of her mother and is grieving.    Clinical Impression   PTA patient independent and driving. Admitted for above and is limited by problem list below, including L sided weakness, significant sensory-motor deficits/limb apraxia, impaired sensation and coordination, impaired vision, impaired balance, and impaired cognition.  She is oriented and follows simple commands with increased time, but presents with poor awareness, problem solving and attention.  She presents with poor spatial awareness and limited to no control of L UE.  Further visual assessment required, pt very inconsistent during testing, but anticipate a L sided visual deficit. She requires min-total assist +2 for ADLs, mod assist +2 for sit to stand transfers at EOB with heavy posterior lean. Believe she will benefit from intensive CIR level therapies to optimize independence, safety with ADLs, transfers and mobility. Will follow acutely.    Follow Up Recommendations  CIR    Equipment Recommendations  3 in 1 bedside commode    Recommendations for Other Services Rehab consult     Precautions / Restrictions Precautions Precautions: Fall Restrictions Weight Bearing Restrictions: No      Mobility Bed Mobility Overal bed mobility: Needs Assistance Bed Mobility: Rolling;Sidelying to Sit;Sit to Supine Rolling: Min guard Sidelying to sit: Min assist   Sit to supine: Min assist;+2 for safety/equipment   General bed mobility  comments: pt required assist to establish sitting EOB; return to bed she was clearly stuggling with spatial perception of where she and the bed are in space; ultimately needed assist to raise legs onto bed and 2nd person at head to protect from hitting against bed rails  Transfers Overall transfer level: Needs assistance Equipment used: 2 person hand held assist Transfers: Sit to/from Stand Sit to Stand: Mod assist;+2 physical assistance         General transfer comment: mod assist +2 to power up and steady with heavy posterior lean, fatiguing increased R lateral lean     Balance Overall balance assessment: Needs assistance Sitting-balance support: No upper extremity supported;Feet supported Sitting balance-Leahy Scale: Poor Sitting balance - Comments: slight posterior lean, initially rt lean and with fatigue begins to lean to her right (decreased awareness of her body's position to midline) Postural control: Posterior lean;Right lateral lean;Left lateral lean Standing balance support: Bilateral upper extremity supported Standing balance-Leahy Scale: Zero Standing balance comment: strong posterior lean with no righting reaction to shift herself forward; did allow therapists to shift her forward over her feet, but could not maintain                           ADL either performed or assessed with clinical judgement   ADL Overall ADL's : Needs assistance/impaired     Grooming: Sitting;Moderate assistance   Upper Body Bathing: Sitting;Moderate assistance   Lower Body Bathing: Total assistance;+2 for physical assistance;+2 for safety/equipment;Sit to/from stand   Upper Body Dressing : Sitting;Moderate assistance   Lower Body Dressing: Total assistance;+2 for physical assistance;+2 for safety/equipment;Sit to/from stand  Toilet Transfer Details (indicate cue type and reason): deferred         Functional mobility during ADLs: +2 for physical assistance;+2 for  safety/equipment;Moderate assistance General ADL Comments: pt limited by cognition, L sided deficits  (sesnation, coordination, motor planning), balance       Vision Baseline Vision/History: Wears glasses Wears Glasses: Reading only Patient Visual Report: No change from baseline Vision Assessment?: Vision impaired- to be further tested in functional context Additional Comments: attempted quadrant testing with pt unable to follow multiple step commands, peripheral vision testing with Pt highly inconsistent on L side ranging anywhere from functional to L sided visual cut; but able to read phone number and menu without assist; continue assessment      Perception     Praxis      Pertinent Vitals/Pain Pain Assessment: Faces Faces Pain Scale: Hurts little more Pain Location: bil feet; below ankles Pain Descriptors / Indicators: Aching Pain Intervention(s): Limited activity within patient's tolerance;Monitored during session;Repositioned     Hand Dominance Right   Extremity/Trunk Assessment Upper Extremity Assessment Upper Extremity Assessment: RUE deficits/detail;LUE deficits/detail RUE Deficits / Details: WFL  LUE Deficits / Details: apparent sensory/motor deficits, limb apraxia "alien arm" with poor proprioception, sensation, and coordination; pt hitting arm on bed rails multiple times LUE Sensation: decreased light touch;decreased proprioception LUE Coordination: decreased fine motor;decreased gross motor   Lower Extremity Assessment Lower Extremity Assessment: Defer to PT evaluation LLE Deficits / Details: AAROM WFL; strength-hip flexion2+, knee extension 3-, ankle DF 3 LLE Sensation: (absent to light touch, deep presure, pain) LLE Coordination: decreased gross motor(due to decr sensation and weakness)   Cervical / Trunk Assessment Cervical / Trunk Assessment: Other exceptions Cervical / Trunk Exceptions: overweight; twists/turns to her left side wh)en supine and in sitting  (stating she wants to sit straight, but continuing to angle her knees towards the left and scoot rt hip forward towards EOB   Communication Communication Communication: Other (comment)(perseverating, talking to no one on the phone)   Cognition Arousal/Alertness: Lethargic(initally, improved during session) Behavior During Therapy: Anxious;Impulsive;Restless Overall Cognitive Status: Impaired/Different from baseline Area of Impairment: Attention;Safety/judgement;Awareness;Problem solving;Following commands                   Current Attention Level: Sustained;Focused   Following Commands: Follows one step commands consistently;Follows one step commands with increased time;Follows multi-step commands inconsistently Safety/Judgement: Decreased awareness of safety;Decreased awareness of deficits Awareness: Intellectual Problem Solving: Difficulty sequencing;Requires verbal cues;Requires tactile cues;Decreased initiation General Comments: patient tangled up in phone/call bell cords initally, then when untangled patient reaching into midair reporting "these cords are just everywhere", pt reports needing to talk to her daughter therefore handed her the phone and pt talking to daughter without phone on, assisted to call daughter and patinet voiced "hey they are in here. I'll call you back". ; pt in constant motion, poor awareness and control of L UE, no awareness of deficits or need for assist    General Comments       Exercises     Shoulder Instructions      Home Living Family/patient expects to be discharged to:: Private residence Living Arrangements: Spouse/significant other Available Help at Discharge: Family(spouse works, granddaughter can help?) Type of Home: Mobile home Home Access: Stairs to enter Technical brewer of Steps: 3 Entrance Stairs-Rails: Left Home Layout: One level     Bathroom Shower/Tub: Teacher, early years/pre: Handicapped height Bathroom  Accessibility: Yes   Home Equipment: Rafael Gonzalez - single point;Walker -  4 wheels;Other (comment)(toilet extension)   Additional Comments: question accuracy, some info taken from 09/2017 record      Prior Functioning/Environment Level of Independence: Independent with assistive device(s)        Comments: per 09/2017 record she used cane or rollator when outside home/in community, pt reports driving and independent with ADLs        OT Problem List: Decreased strength;Decreased activity tolerance;Impaired balance (sitting and/or standing);Impaired vision/perception;Decreased coordination;Decreased cognition;Decreased safety awareness;Decreased knowledge of use of DME or AE;Decreased knowledge of precautions;Impaired sensation;Obesity;Impaired UE functional use      OT Treatment/Interventions: Self-care/ADL training;DME and/or AE instruction;Therapeutic activities;Cognitive remediation/compensation;Visual/perceptual remediation/compensation;Patient/family education;Balance training;Neuromuscular education    OT Goals(Current goals can be found in the care plan section) Acute Rehab OT Goals Patient Stated Goal: to go home with [facility] therapies OT Goal Formulation: With patient Time For Goal Achievement: 10/17/19 Potential to Achieve Goals: Good  OT Frequency: Min 3X/week   Barriers to D/C:            Co-evaluation PT/OT/SLP Co-Evaluation/Treatment: Yes Reason for Co-Treatment: Necessary to address cognition/behavior during functional activity;For patient/therapist safety;To address functional/ADL transfers   OT goals addressed during session: ADL's and self-care      AM-PAC OT "6 Clicks" Daily Activity     Outcome Measure Help from another person eating meals?: A Lot Help from another person taking care of personal grooming?: A Lot Help from another person toileting, which includes using toliet, bedpan, or urinal?: Total Help from another person bathing (including washing, rinsing,  drying)?: A Lot Help from another person to put on and taking off regular upper body clothing?: A Lot Help from another person to put on and taking off regular lower body clothing?: Total 6 Click Score: 10   End of Session Equipment Utilized During Treatment: Rolling walker Nurse Communication: Mobility status;Precautions  Activity Tolerance: Patient tolerated treatment well Patient left: in bed;with call bell/phone within reach;with bed alarm set;Other (comment)(bed in chair position)  OT Visit Diagnosis: Other abnormalities of gait and mobility (R26.89);Other symptoms and signs involving cognitive function;Other symptoms and signs involving the nervous system (R29.898);Muscle weakness (generalized) (M62.81);Low vision, both eyes (H54.2)                Time: 1015-1050 OT Time Calculation (min): 35 min Charges:  OT General Charges $OT Visit: 1 Visit OT Evaluation $OT Eval High Complexity: 1 High  Barry Brunner, OT Acute Rehabilitation Services Pager [phone] Office [phone]   Chancy Milroy 10/03/2019, 2:45 PM

## 2019-10-03 NOTE — Progress Notes (Signed)
Rehab Admissions Coordinator Note:  Patient was screened by Clois Dupes for appropriateness for an Inpatient Acute Rehab Consult per PT recs.   At this time, we are recommending Inpatient Rehab consult. I will place order per protocol.  Clois Dupes RN MSN 10/03/2019, 1:27 PM  I can be reached at [phone].

## 2019-10-03 NOTE — Evaluation (Signed)
Physical Therapy Evaluation Patient Details Name: [name] Good MRN: [id number] DOB: 10-08-1950 Today's Date: 10/03/2019   History of Present Illness  Pt is a 69 y/o female with PMH of CAD, depression, arthritis, RLS, MI, prior lumbar fusion, R THA, presenting for acute onset on L sided weakness and slurred speech. CT negative for acute infarct. 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  Pt also found with severe hypokalemia and dehydration; noted recent loss of her mother and is grieving.   Clinical Impression   Pt admitted with above diagnosis. Patient currently with left sided weakness and apparent absent sensation. She is highly internally distracted, but responds to calm tone of voice and can attend long enough to follow simple commands. Her spatial awareness is impacted (?partially due to vision changes) as she cannot correctly identify where her body is in relation to midline (and varied left to right, consistently posterior). She currently required +2 max assist for standing and is unable to walk (unable to advance LLE due to inability to weight shift over RLE). Pt currently with functional limitations due to the deficits listed below (see PT Problem List). Pt will benefit from skilled PT to increase their independence and safety with mobility to allow discharge to the venue listed below.        Follow Up Recommendations CIR    Equipment Recommendations  Other (comment)(TBD at next venue)    Recommendations for Other Services Rehab consult;Speech consult     Precautions / Restrictions Precautions Precautions: Fall      Mobility  Bed Mobility Overal bed mobility: Needs Assistance Bed Mobility: Rolling;Sidelying to Sit;Sit to Supine Rolling: Min guard(to her left; right NT) Sidelying to sit: Min assist   Sit to supine: Min assist;+2 for safety/equipment   General bed mobility comments: pt required assist to establish sitting EOB;  return to bed she was clearly stuggling with spatial perception of where she and the bed are in space; ultimately needed assist to raise legs onto bed and 2nd person at head to protect from hitting against bed rails  Transfers Overall transfer level: Needs assistance Equipment used: 2 person hand held assist Transfers: Sit to/from Stand Sit to Stand: Mod assist;+2 physical assistance         General transfer comment: however immediately leans posterior and requires +2 max assist to prevent posterior fall; with fatigue leaning towards her right and resisting weight shift over her RLE  Ambulation/Gait             General Gait Details: unable; in standing able to lift rt foot off the floor (with stable LLE) however could not begin to lift lt foot off floor due to inability to weight shift over RLE  Stairs            Wheelchair Mobility    Modified Rankin (Stroke Patients Only) Modified Rankin (Stroke Patients Only) Pre-Morbid Rankin Score: No significant disability Modified Rankin: Severe disability     Balance Overall balance assessment: Needs assistance Sitting-balance support: No upper extremity supported;Feet supported Sitting balance-Leahy Scale: Poor Sitting balance - Comments: slight posterior lean, initially rt lean and with fatigue begins to lean to her right (decreased awareness of her body's position to midline) Postural control: Posterior lean;Right lateral lean;Left lateral lean Standing balance support: Bilateral upper extremity supported Standing balance-Leahy Scale: Zero Standing balance comment: strong posterior lean with no righting reaction to shift herself forward; did allow therapists to shift her forward over her feet, but  could not maintain                             Pertinent Vitals/Pain Pain Assessment: Faces Faces Pain Scale: Hurts little more Pain Location: bil feet; below ankles Pain Descriptors / Indicators: Aching Pain  Intervention(s): Limited activity within patient's tolerance;Monitored during session    Home Living Family/patient expects to be discharged to:: Private residence Living Arrangements: Spouse/significant other Available Help at Discharge: Family(states husband works; ?Information systems manager can help) Type of Home: Mobile home Home Access: Stairs to enter Entrance Stairs-Rails: Left Entrance Stairs-Number of Steps: 3 Home Layout: One level Home Equipment: Cane - single point;Walker - 4 wheels Additional Comments: some information taken from 09/2017 medical record and pt with ?accuracy    Prior Function Level of Independence: Independent with assistive device(s)         Comments: per 09/2017 record she used cane or rollator when outside home/in community     Hand Dominance   Dominant Hand: Right    Extremity/Trunk Assessment   Upper Extremity Assessment Upper Extremity Assessment: Defer to OT evaluation    Lower Extremity Assessment Lower Extremity Assessment: LLE deficits/detail;Generalized weakness LLE Deficits / Details: AAROM WFL; strength-hip flexion2+, knee extension 3-, ankle DF 3 LLE Sensation: (absent to light touch, deep presure, pain) LLE Coordination: decreased gross motor(due to decr sensation and weakness)    Cervical / Trunk Assessment Cervical / Trunk Assessment: Other exceptions Cervical / Trunk Exceptions: overweight; twists/turns to her left side wh)en supine and in sitting (stating she wants to sit straight, but continuing to angle her knees towards the left and scoot rt hip forward towards EOB  Communication   Communication: Other (comment)(perseverating; talking into thephone with no one on the line)  Cognition Arousal/Alertness: Lethargic(initially, improved during session) Behavior During Therapy: Anxious;Impulsive;Restless Overall Cognitive Status: Impaired/Different from baseline Area of Impairment: Attention;Safety/judgement;Awareness;Problem solving                    Current Attention Level: Sustained(closer to focused a lot of the time)     Safety/Judgement: Decreased awareness of safety;Decreased awareness of deficits Awareness: Intellectual Problem Solving: Difficulty sequencing;Requires verbal cues;Requires tactile cues General Comments: pt in nearly constant motion (esp LUE with pt decreased awareness of where LUE is--hitting against railing at times);  absent sensation in left hemibody and decr sense of midline complicating her ability to follow commands in sitting or standing; talking to daughter through phone that was turned off; kept reaching for "those wires" with LUE even after cords had been moved away      General Comments      Exercises     Assessment/Plan    PT Assessment Patient needs continued PT services  PT Problem List Decreased strength;Decreased balance;Decreased mobility;Decreased coordination;Decreased cognition;Decreased knowledge of use of DME;Decreased safety awareness;Decreased knowledge of precautions;Impaired sensation;Obesity       PT Treatment Interventions DME instruction;Gait training;Functional mobility training;Therapeutic activities;Balance training;Therapeutic exercise;Neuromuscular re-education;Cognitive remediation;Patient/family education    PT Goals (Current goals can be found in the Care Plan section)  Acute Rehab PT Goals Patient Stated Goal: to go home with [facility] therapies PT Goal Formulation: With patient(general concepts as she could not attend to specifics) Time For Goal Achievement: 10/17/19 Potential to Achieve Goals: Good    Frequency Min 4X/week   Barriers to discharge Inaccessible home environment;Decreased caregiver support steps to enter; ?spouse and grandaughter can provide 24/7 assist    Co-evaluation  AM-PAC PT "6 Clicks" Mobility  Outcome Measure Help needed turning from your back to your side while in a flat bed without using bedrails?: A  Lot Help needed moving from lying on your back to sitting on the side of a flat bed without using bedrails?: A Little Help needed moving to and from a bed to a chair (including a wheelchair)?: Total Help needed standing up from a chair using your arms (e.g., wheelchair or bedside chair)?: Total Help needed to walk in hospital room?: Total Help needed climbing 3-5 steps with a railing? : Total 6 Click Score: 9    End of Session Equipment Utilized During Treatment: Gait belt Activity Tolerance: Patient tolerated treatment well Patient left: in bed;with call bell/phone within reach;with bed alarm set(telephone, call bell on her right; chair position for safety) Nurse Communication: Mobility status;Need for lift equipment PT Visit Diagnosis: Other symptoms and signs involving the nervous system (R29.898);Hemiplegia and hemiparesis Hemiplegia - Right/Left: Left Hemiplegia - dominant/non-dominant: Non-dominant Hemiplegia - caused by: Cerebral infarction    Time: 4650-3546 PT Time Calculation (min) (ACUTE ONLY): 35 min   Charges:   PT Evaluation $PT Eval High Complexity: 1 High           Arby Barrette, PT Pager [phone]   Rexanne Mano 10/03/2019, 12:38 PM

## 2019-10-03 NOTE — Progress Notes (Signed)
ANTICOAGULATION CONSULT NOTE - Initial Consult  Pharmacy Consult for apixaban Indication:  lv apical clot  Allergies  Allergen Reactions   Amoxicillin Other (See Comments)    Tolerated Zosyn Has patient had a PCN reaction causing immediate rash, facial/tongue/throat swelling, SOB or lightheadedness with hypotension: No Has patient had a PCN reaction causing severe rash involving mucus membranes or skin necrosis: No Has patient had a PCN reaction that required hospitalization No Has patient had a PCN reaction occurring within the last 10 years: No If all of the above answers are "NO", then may proceed with Cephalosporin use.    Patient Measurements: Height: 5\' 1"  (154.9 cm) Weight: 105.8 kg (233 lb 4 oz) IBW/kg (Calculated) : 47.8 Heparin Dosing Weight:   Vital Signs: Temp: 98.6 F (37 C) (06/08 1150) Temp Source: Oral (06/08 1150) BP: 124/57 (06/08 1150) Pulse Rate: 90 (06/08 1150)  Labs: Recent Labs    10/01/19 2056 10/01/19 2105 10/02/19 0630  HGB 14.0 13.6  --   HCT 40.7 40.0  --   PLT 158  --   --   APTT 23*  --   --   LABPROT 13.8  --   --   INR 1.1  --   --   CREATININE 1.08* 0.80 0.71    Estimated Creatinine Clearance: 73.3 mL/min (by C-G formula based on SCr of 0.71 mg/dL).   Medical History: Past Medical History:  Diagnosis Date   Arthritis    Depression    GERD (gastroesophageal reflux disease)    Myocardial infarction (HCC) 89   no visits to cardiac dr(thomas kelly) since 89   Wound dehiscence    lumbar    Medications:  Medications Prior to Admission  Medication Sig Dispense Refill Last Dose   calcium carbonate (TUMS EX) 750 MG chewable tablet Chew 1 tablet by mouth 2 (two) times daily as needed for heartburn.   Past Week at Unknown time   fluticasone (FLONASE) 50 MCG/ACT nasal spray Place 2 sprays into both nostrils daily as needed.   10/01/2019 at Unknown time   ibuprofen (ADVIL) 200 MG tablet Take 400-600 mg by mouth every 6 (six)  hours as needed.   Past Week at Unknown time   oxyCODONE-acetaminophen (PERCOCET) 7.5-325 MG tablet Take 1 tablet by mouth 4 (four) times daily as needed for pain.   10/01/2019 at Unknown time   phentermine (ADIPEX-P) 37.5 MG tablet Take 37.5 mg by mouth daily as needed (appetite).    Past Week at Unknown time   Pseudoephedrine HCl (SUDAFED PO) Take 1 tablet by mouth daily as needed (Allergies).   10/01/2019 at Unknown time   rOPINIRole (REQUIP) 3 MG tablet Take 3 mg by mouth at bedtime as needed (restless leg).    Past Week at Unknown time   tetrahydrozoline 0.05 % ophthalmic solution Place 1 drop into both eyes daily as needed (Dry eyes).   10/01/2019 at Unknown time   aspirin 325 MG EC tablet Take 1 tablet (325 mg total) by mouth daily. (Patient not taking: Reported on 10/02/2019) 30 tablet 0 Not Taking at Unknown time   doxycycline (VIBRAMYCIN) 100 MG capsule Take 1 capsule (100 mg total) by mouth 2 (two) times daily. (Patient not taking: Reported on 10/02/2019) 12 capsule 0 Completed Course at Unknown time   methocarbamol (ROBAXIN) 500 MG tablet Take 1 tablet (500 mg total) by mouth every 6 (six) hours as needed for muscle spasms. (Patient not taking: Reported on 10/02/2019) 60 tablet 1 Not Taking at  Unknown time   methylPREDNISolone (MEDROL) 4 MG tablet Medrol dose pack. Take as instructed (Patient not taking: Reported on 10/02/2019) 21 tablet 0 Completed Course at Unknown time   predniSONE (DELTASONE) 5 MG tablet Take 1 tablet (5 mg total) by mouth daily with breakfast. (Patient not taking: Reported on 10/02/2019) 30 tablet 1 Not Taking at Unknown time   Scheduled:    stroke: mapping our early stages of recovery book   Does not apply Once   apixaban  10 mg Oral Once   apixaban  10 mg Oral BID   Followed by   Derrill Memo ON 10/10/2019] apixaban  5 mg Oral BID   atorvastatin  80 mg Oral Daily   feeding supplement  1 Container Oral TID BM   multivitamin with minerals  1 tablet Oral Daily    potassium chloride  20 mEq Oral BID   Infusions:    Assessment: Ryleah was admitted for R thalamic infarct. ECHO showed LV thrombus. Apixaban has been ordered for anticoagulation.   Scr <1 Hbg wnl  Goal of Therapy:   Monitor platelets by anticoagulation protocol: Yes   Plan:  Apixaban 10mg  PO BID x7d then 5mg  PO BID F/u with bleeding and teaching Rx will sign off  Onnie Boer, PharmD, BCIDP, AAHIVP, CPP Infectious Disease Pharmacist 10/03/2019 2:56 PM

## 2019-10-03 NOTE — Consult Note (Signed)
Referring Physician: A David Stall, MD  [name] Good is an 69 y.o. female.                       Chief Complaint: LV apical thrombus  HPI: 69 years old white female with PMH of CAD, GERD, Depression, Back pain, Hip pain, RLS had left sided weakness and slurred speech. 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 Upon further questioning patient had indigestion type feeling 3-4 weeks ago for which she did not seek medical attention.  Past Medical History:  Diagnosis Date  . Arthritis   . Depression   . GERD (gastroesophageal reflux disease)   . Myocardial infarction (HCC) 89   no visits to cardiac dr(thomas kelly) since 92  . Wound dehiscence    lumbar      Past Surgical History:  Procedure Laterality Date  . APPENDECTOMY     17 yrs  . BREAST SURGERY Left    cyst  . CARDIAC CATHETERIZATION  89  . CHOLECYSTECTOMY     69 yrs old  . LUMBAR LAMINECTOMY/DECOMPRESSION MICRODISCECTOMY N/A 06/14/2015   Procedure: Lumbar Three-Four,Lumbar Four-Five, Lumbar Five-Sacral One Laminectomy;  Surgeon: Tia Alert, MD;  Location: [facility];  Service: Neurosurgery;  Laterality: N/A;  . LUMBAR WOUND DEBRIDEMENT N/A 07/24/2015   Procedure: lumbar wound revision;  Surgeon: Tia Alert, MD;  Location: [facility];  Service: Neurosurgery;  Laterality: N/A;  . TOTAL HIP ARTHROPLASTY Right 10/25/2015   Procedure: RIGHT TOTAL HIP ARTHROPLASTY ANTERIOR APPROACH;  Surgeon: Kathryne Hitch, MD;  Location: [facility];  Service: Orthopedics;  Laterality: Right;  . TUBAL LIGATION      History reviewed. No pertinent family history. Social History:  reports that she quit smoking about 6 years ago. Her smoking use included cigarettes. She has a 80.00 pack-year smoking history. She has never used smokeless tobacco. She reports current alcohol use. She reports that she does not use  drugs.  Allergies:  Allergies  Allergen Reactions  . Amoxicillin Other (See Comments)    Tolerated Zosyn Has patient had a PCN reaction causing immediate rash, facial/tongue/throat swelling, SOB or lightheadedness with hypotension: No Has patient had a PCN reaction causing severe rash involving mucus membranes or skin necrosis: No Has patient had a PCN reaction that required hospitalization No Has patient had a PCN reaction occurring within the last 10 years: No If all of the above answers are "NO", then may proceed with Cephalosporin use.    Medications Prior to Admission  Medication Sig Dispense Refill  . calcium carbonate (TUMS EX) 750 MG chewable tablet Chew 1 tablet by mouth 2 (two) times daily as needed for heartburn.    . fluticasone (FLONASE) 50 MCG/ACT nasal spray Place 2 sprays into both nostrils daily as needed.    Marland Kitchen ibuprofen (ADVIL) 200 MG tablet Take 400-600 mg by mouth every 6 (six) hours as needed.    Marland Kitchen oxyCODONE-acetaminophen (PERCOCET) 7.5-325 MG tablet Take 1 tablet by mouth 4 (four) times daily as needed for pain.    . phentermine (ADIPEX-P) 37.5 MG tablet Take 37.5 mg by mouth daily as needed (appetite).     . Pseudoephedrine HCl (SUDAFED PO) Take 1 tablet by mouth daily as needed (Allergies).    Marland Kitchen rOPINIRole (REQUIP) 3 MG tablet Take 3 mg by mouth at bedtime as needed (restless leg).     Marland Kitchen  tetrahydrozoline 0.05 % ophthalmic solution Place 1 drop into both eyes daily as needed (Dry eyes).    Marland Kitchen aspirin 325 MG EC tablet Take 1 tablet (325 mg total) by mouth daily. (Patient not taking: Reported on 10/02/2019) 30 tablet 0  . doxycycline (VIBRAMYCIN) 100 MG capsule Take 1 capsule (100 mg total) by mouth 2 (two) times daily. (Patient not taking: Reported on 10/02/2019) 12 capsule 0  . methocarbamol (ROBAXIN) 500 MG tablet Take 1 tablet (500 mg total) by mouth every 6 (six) hours as needed for muscle spasms. (Patient not taking: Reported on 10/02/2019) 60 tablet 1  .  methylPREDNISolone (MEDROL) 4 MG tablet Medrol dose pack. Take as instructed (Patient not taking: Reported on 10/02/2019) 21 tablet 0  . predniSONE (DELTASONE) 5 MG tablet Take 1 tablet (5 mg total) by mouth daily with breakfast. (Patient not taking: Reported on 10/02/2019) 30 tablet 1    Results for orders placed or performed during the hospital encounter of 10/01/19 (from the past 48 hour(s))  Protime-INR     Status: None   Collection Time: 10/01/19  8:56 PM  Result Value Ref Range   Prothrombin Time 13.8 11.4 - 15.2 seconds   INR 1.1 0.8 - 1.2    Comment: (NOTE) INR goal varies based on device and disease states. Performed at [facility], [place] [address], [place], [place] [address]   APTT     Status: Abnormal   Collection Time: 10/01/19  8:56 PM  Result Value Ref Range   aPTT 23 (L) 24 - 36 seconds    Comment: Performed at [facility] [address] [place], [place] [address]  CBC     Status: None   Collection Time: 10/01/19  8:56 PM  Result Value Ref Range   WBC 7.3 4.0 - 10.5 K/uL   RBC 4.45 3.87 - 5.11 MIL/uL   Hemoglobin 14.0 12.0 - 15.0 g/dL   HCT 40.7 36.0 - 46.0 %   MCV 91.5 80.0 - 100.0 fL   MCH 31.5 26.0 - 34.0 pg   MCHC 34.4 30.0 - 36.0 g/dL   RDW 12.4 11.5 - 15.5 %   Platelets 158 150 - 400 K/uL    Comment: SPECIMEN CHECKED FOR CLOTS PLATELET COUNT CONFIRMED BY SMEAR REPEATED TO VERIFY    nRBC 0.0 0.0 - 0.2 %    Comment: Performed at [facility], [place] [address], [place], [place] [address]  Differential     Status: None   Collection Time: 10/01/19  8:56 PM  Result Value Ref Range   Neutrophils Relative % 65 %   Neutro Abs 4.8 1.7 - 7.7 K/uL   Lymphocytes Relative 21 %   Lymphs Abs 1.5 0.7 - 4.0 K/uL   Monocytes Relative 10 %   Monocytes Absolute 0.7 0.1 - 1.0 K/uL   Eosinophils Relative 3 %   Eosinophils Absolute 0.2 0.0 - 0.5 K/uL   Basophils Relative 1 %   Basophils Absolute 0.1 0.0 - 0.1 K/uL   Immature Granulocytes 0 %   Abs  Immature Granulocytes 0.02 0.00 - 0.07 K/uL    Comment: Performed at [facility] [address], [place], [place] [address]  Comprehensive metabolic panel     Status: Abnormal   Collection Time: 10/01/19  8:56 PM  Result Value Ref Range   Sodium 138 135 - 145 mmol/L   Potassium 2.3 (LL) 3.5 - 5.1 mmol/L    Comment: CRITICAL RESULT CALLED TO,  READ BACK BY AND VERIFIED WITH: KOPP G,RN 10/01/19 2130 WAYK    Chloride 102 98 - 111 mmol/L   CO2 22 22 - 32 mmol/L   Glucose, Bld 106 (H) 70 - 99 mg/dL    Comment: Glucose reference range applies only to samples taken after fasting for at least 8 hours.   BUN 14 8 - 23 mg/dL   Creatinine, Ser 4.09 (H) 0.44 - 1.00 mg/dL   Calcium 8.9 8.9 - 81.1 mg/dL   Total Protein 6.1 (L) 6.5 - 8.1 g/dL   Albumin 3.5 3.5 - 5.0 g/dL   AST 35 15 - 41 U/L   ALT 23 0 - 44 U/L   Alkaline Phosphatase 88 38 - 126 U/L   Total Bilirubin 1.0 0.3 - 1.2 mg/dL   GFR calc non Af Amer 52 (L) >60 mL/min   GFR calc Af Amer >60 >60 mL/min   Anion gap 14 5 - 15    Comment: Performed at [facility], [address] [place], [place] [address]  I-stat chem 8, ED     Status: Abnormal   Collection Time: 10/01/19  9:05 PM  Result Value Ref Range   Sodium 140 135 - 145 mmol/L   Potassium 2.3 (LL) 3.5 - 5.1 mmol/L   Chloride 102 98 - 111 mmol/L   BUN 15 8 - 23 mg/dL   Creatinine, Ser 2.95 0.44 - 1.00 mg/dL   Glucose, Bld 621 (H) 70 - 99 mg/dL    Comment: Glucose reference range applies only to samples taken after fasting for at least 8 hours.   Calcium, Ion 1.19 1.15 - 1.40 mmol/L   TCO2 23 22 - 32 mmol/L   Hemoglobin 13.6 12.0 - 15.0 g/dL   HCT 30.8 65.7 - 84.6 %  CBG monitoring, ED     Status: None   Collection Time: 10/01/19 10:56 PM  Result Value Ref Range   Glucose-Capillary 97 70 - 99 mg/dL    Comment: Glucose reference range applies only to samples taken after fasting for at least 8 hours.  Hemoglobin A1c     Status: Abnormal   Collection Time:  10/02/19  6:30 AM  Result Value Ref Range   Hgb A1c MFr Bld 5.7 (H) 4.8 - 5.6 %    Comment: (NOTE) Pre diabetes:          5.7%-6.4% Diabetes:              >6.4% Glycemic control for   <7.0% adults with diabetes    Mean Plasma Glucose 116.89 mg/dL    Comment: Performed at [facility], [address] [place], [place] [address]  Lipid panel     Status: Abnormal   Collection Time: 10/02/19  6:30 AM  Result Value Ref Range   Cholesterol 176 0 - 200 mg/dL   Triglycerides 284 <132 mg/dL   HDL 38 (L) >44 mg/dL   Total CHOL/HDL Ratio 4.6 RATIO   VLDL 20 0 - 40 mg/dL   LDL Cholesterol 010 (H) 0 - 99 mg/dL    Comment:        Total Cholesterol/HDL:CHD Risk Coronary Heart Disease Risk Table                     Men   Women  1/2 Average Risk   3.4   3.3  Average Risk       5.0   4.4  2 X Average Risk   9.6  7.1  3 X Average Risk  23.4   11.0        Use the calculated Patient Ratio above and the CHD Risk Table to determine the patient's CHD Risk.        ATP III CLASSIFICATION (LDL):  <100     mg/dL   Optimal  161-096  mg/dL   Near or Above                    Optimal  130-159  mg/dL   Borderline  045-409  mg/dL   High  >811     mg/dL   Very High Performed at [facility], [address], [place], [place] [address]   Basic metabolic panel     Status: Abnormal   Collection Time: 10/02/19  6:30 AM  Result Value Ref Range   Sodium 140 135 - 145 mmol/L   Potassium 3.5 3.5 - 5.1 mmol/L   Chloride 107 98 - 111 mmol/L   CO2 23 22 - 32 mmol/L   Glucose, Bld 107 (H) 70 - 99 mg/dL    Comment: Glucose reference range applies only to samples taken after fasting for at least 8 hours.   BUN 8 8 - 23 mg/dL   Creatinine, Ser 2.95 0.44 - 1.00 mg/dL   Calcium 8.5 (L) 8.9 - 10.3 mg/dL   GFR calc non Af Amer >60 >60 mL/min   GFR calc Af Amer >60 >60 mL/min   Anion gap 10 5 - 15    Comment: Performed at [facility], [address] [place], [place] [address]  Magnesium      Status: None   Collection Time: 10/02/19  6:30 AM  Result Value Ref Range   Magnesium 1.7 1.7 - 2.4 mg/dL    Comment: Performed at [facility], [address], [place], [place] [address]   CT Code Stroke CTA Head W/WO contrast  Result Date: 10/01/2019 CLINICAL DATA:  Initial evaluation for acute stroke, left-sided weakness, altered mental status. EXAM: CT ANGIOGRAPHY HEAD AND NECK CT PERFUSION BRAIN TECHNIQUE: Multidetector CT imaging of the head and neck was performed using the standard protocol during bolus administration of intravenous contrast. Multiplanar CT image reconstructions and MIPs were obtained to evaluate the vascular anatomy. Carotid stenosis measurements (when applicable) are obtained utilizing NASCET criteria, using the distal internal carotid diameter as the denominator. Multiphase CT imaging of the brain was performed following IV bolus contrast injection. Subsequent parametric perfusion maps were calculated using RAPID software. CONTRAST:  OMNIPAQUE IOHEXOL 350 MG/ML SOLN COMPARISON:  Comparison made with prior noncontrast head CT from earlier same day. FINDINGS: CTA NECK FINDINGS Aortic arch: Visualized aortic arch of normal caliber with normal branch pattern. Moderate atheromatous plaque seen about the arch and origin of the great vessels without hemodynamically significant stenosis. Note made of soft plaque protruding into the lumen of the distal arch/proximal intrathoracic aorta (series 5, image 307). Visualized subclavian arteries widely patent. Right carotid system: Right common carotid artery patent from its origin to the bifurcation without flow-limiting stenosis. Scattered eccentric plaque about the right bifurcation without significant stenosis. Just distally within the proximal right ICA, additional focal plaque is seen with associated short-segment stenosis of up to 50% by NASCET criteria (series 5, image 169). Right ICA tortuous and medialized into the  retropharyngeal space. Right ICA otherwise widely patent distally to the skull base without stenosis, dissection or occlusion. Left carotid system: Left common carotid artery patent  from its origin to the bifurcation without stenosis. Scattered eccentric mixed plaque about the left bifurcation/proximal left ICA without hemodynamically significant stenosis. Proximal left ICA tortuous and medialized into the retropharyngeal space. Left ICA widely patent distally to the skull base without stenosis, dissection or occlusion. Vertebral arteries: Both vertebral arteries arise from the subclavian arteries. Strongly dominant right vertebral artery with a markedly hypoplastic left vertebral artery noted. Dominant right vertebral artery patent within the neck without stenosis, dissection, or occlusion. Markedly diminutive left vertebral artery patent as well without appreciable stenosis or other vascular abnormality. Skeleton: Alignment: Straightening of the normal cervical lordosis. No listhesis or malalignment. Skull base and vertebrae: Skull base intact. Normal C1-2 articulations are preserved in the dens is intact. Vertebral body heights maintained. No acute fracture. Soft tissues and spinal canal: Soft tissues of the neck demonstrate no acute finding. No abnormal prevertebral edema. Spinal canal within normal limits. Disc levels: Mild multilevel cervical spondylosis without significant stenosis, most pronounced at C6-7. Upper chest: Visualized upper chest demonstrates no acute finding. Partially visualized lung apices are clear. Other: None. Other neck: No other acute soft tissue abnormality within the neck. No mass lesion or adenopathy. Subcentimeter calcification noted within the left lobe of thyroid. Upper chest: Visualized upper chest demonstrates no acute finding. Partially visualized lungs are grossly clear. Review of the MIP images confirms the above findings CTA HEAD FINDINGS Anterior circulation: Petrous segments  widely patent bilaterally. Scattered atheromatous plaque throughout the carotid siphons with relatively mild multifocal stenosis. A1 segments patent bilaterally. Normal anterior communicating artery complex. Anterior cerebral arteries widely patent proximally. Short-segment severe distal right ACA stenosis noted (series 5, image 62). ACAs otherwise widely patent to their distal aspects. No M1 stenosis or occlusion. Normal MCA bifurcations. Distal MCA branches well perfused and symmetric. Posterior circulation: Dominant right vertebral artery patent to the vertebrobasilar junction without stenosis. Right PICA not visualized. Hypoplastic left vertebral artery patent to the vertebrobasilar junction as well. Left PICA patent. Basilar patent to its distal aspect without stenosis. Superior cerebral arteries patent bilaterally. Left PCA supplied via the basilar. Right PCA supplied via a hypoplastic right P1 segment and robust right posterior communicating artery. Both PCAs well perfused to their distal aspects without stenosis. Venous sinuses: Patent. Anatomic variants: Predominant fetal type origin of the right PCA. No intracranial aneurysm. Review of the MIP images confirms the above findings CT Brain Perfusion Findings: ASPECTS: 10 CBF (<30%) Volume: 0mL Perfusion (Tmax>6.0s) volume: 0mL Mismatch Volume: 0mL Infarction Location:Negative CT perfusion for acute core infarct or other perfusion deficit. IMPRESSION: CTA HEAD AND NECK IMPRESSION: 1. Negative CTA for emergent large vessel occlusion. 2. 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 3. Single short-segment severe distal right ACA stenosis as above. 4. Additional moderate atheromatous disease about the major arterial vasculature of the head and neck as above. No other hemodynamically significant or correctable stenosis. CT PERFUSION IMPRESSION: Negative CT perfusion. No evidence for acute  core infarct or other perfusion deficit. CT CERVICAL SPINE IMPRESSION: No acute traumatic injury within the cervical spine. These results were communicated to Dr. Wilford Corner at approximately 9:00 pmon 6/6/2021by text page via the [facility] messaging system. Electronically Signed   By: Rise Mu M.D.   On: 10/01/2019 21:50   CT Code Stroke CTA Neck W/WO contrast  Result Date: 10/01/2019 CLINICAL DATA:  Initial evaluation for acute stroke, left-sided weakness, altered mental status. EXAM: CT ANGIOGRAPHY HEAD AND NECK CT PERFUSION BRAIN  TECHNIQUE: Multidetector CT imaging of the head and neck was performed using the standard protocol during bolus administration of intravenous contrast. Multiplanar CT image reconstructions and MIPs were obtained to evaluate the vascular anatomy. Carotid stenosis measurements (when applicable) are obtained utilizing NASCET criteria, using the distal internal carotid diameter as the denominator. Multiphase CT imaging of the brain was performed following IV bolus contrast injection. Subsequent parametric perfusion maps were calculated using RAPID software. CONTRAST:  OMNIPAQUE IOHEXOL 350 MG/ML SOLN COMPARISON:  Comparison made with prior noncontrast head CT from earlier same day. FINDINGS: CTA NECK FINDINGS Aortic arch: Visualized aortic arch of normal caliber with normal branch pattern. Moderate atheromatous plaque seen about the arch and origin of the great vessels without hemodynamically significant stenosis. Note made of soft plaque protruding into the lumen of the distal arch/proximal intrathoracic aorta (series 5, image 307). Visualized subclavian arteries widely patent. Right carotid system: Right common carotid artery patent from its origin to the bifurcation without flow-limiting stenosis. Scattered eccentric plaque about the right bifurcation without significant stenosis. Just distally within the proximal right ICA, additional focal plaque is seen with associated  short-segment stenosis of up to 50% by NASCET criteria (series 5, image 169). Right ICA tortuous and medialized into the retropharyngeal space. Right ICA otherwise widely patent distally to the skull base without stenosis, dissection or occlusion. Left carotid system: Left common carotid artery patent from its origin to the bifurcation without stenosis. Scattered eccentric mixed plaque about the left bifurcation/proximal left ICA without hemodynamically significant stenosis. Proximal left ICA tortuous and medialized into the retropharyngeal space. Left ICA widely patent distally to the skull base without stenosis, dissection or occlusion. Vertebral arteries: Both vertebral arteries arise from the subclavian arteries. Strongly dominant right vertebral artery with a markedly hypoplastic left vertebral artery noted. Dominant right vertebral artery patent within the neck without stenosis, dissection, or occlusion. Markedly diminutive left vertebral artery patent as well without appreciable stenosis or other vascular abnormality. Skeleton: Alignment: Straightening of the normal cervical lordosis. No listhesis or malalignment. Skull base and vertebrae: Skull base intact. Normal C1-2 articulations are preserved in the dens is intact. Vertebral body heights maintained. No acute fracture. Soft tissues and spinal canal: Soft tissues of the neck demonstrate no acute finding. No abnormal prevertebral edema. Spinal canal within normal limits. Disc levels: Mild multilevel cervical spondylosis without significant stenosis, most pronounced at C6-7. Upper chest: Visualized upper chest demonstrates no acute finding. Partially visualized lung apices are clear. Other: None. Other neck: No other acute soft tissue abnormality within the neck. No mass lesion or adenopathy. Subcentimeter calcification noted within the left lobe of thyroid. Upper chest: Visualized upper chest demonstrates no acute finding. Partially visualized lungs are  grossly clear. Review of the MIP images confirms the above findings CTA HEAD FINDINGS Anterior circulation: Petrous segments widely patent bilaterally. Scattered atheromatous plaque throughout the carotid siphons with relatively mild multifocal stenosis. A1 segments patent bilaterally. Normal anterior communicating artery complex. Anterior cerebral arteries widely patent proximally. Short-segment severe distal right ACA stenosis noted (series 5, image 62). ACAs otherwise widely patent to their distal aspects. No M1 stenosis or occlusion. Normal MCA bifurcations. Distal MCA branches well perfused and symmetric. Posterior circulation: Dominant right vertebral artery patent to the vertebrobasilar junction without stenosis. Right PICA not visualized. Hypoplastic left vertebral artery patent to the vertebrobasilar junction as well. Left PICA patent. Basilar patent to its distal aspect without stenosis. Superior cerebral arteries patent bilaterally. Left PCA supplied via the basilar. Right PCA supplied  via a hypoplastic right P1 segment and robust right posterior communicating artery. Both PCAs well perfused to their distal aspects without stenosis. Venous sinuses: Patent. Anatomic variants: Predominant fetal type origin of the right PCA. No intracranial aneurysm. Review of the MIP images confirms the above findings CT Brain Perfusion Findings: ASPECTS: 10 CBF (<30%) Volume: 38mL Perfusion (Tmax>6.0s) volume: 86mL Mismatch Volume: 45mL Infarction Location:Negative CT perfusion for acute core infarct or other perfusion deficit. IMPRESSION: CTA HEAD AND NECK IMPRESSION: 1. Negative CTA for emergent large vessel occlusion. 2. 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 3. Single short-segment severe distal right ACA stenosis as above. 4. Additional moderate atheromatous disease about the major arterial vasculature of the head and neck as above. No  other hemodynamically significant or correctable stenosis. CT PERFUSION IMPRESSION: Negative CT perfusion. No evidence for acute core infarct or other perfusion deficit. CT CERVICAL SPINE IMPRESSION: No acute traumatic injury within the cervical spine. These results were communicated to Dr. Wilford Corner at approximately 9:00 pmon 6/6/2021by text page via the [facility] messaging system. Electronically Signed   By: Rise Mu M.D.   On: 10/01/2019 21:50   MR BRAIN WO CONTRAST  Result Date: 10/02/2019 CLINICAL DATA:  Follow-up examination for acute stroke. EXAM: MRI HEAD WITHOUT CONTRAST TECHNIQUE: Multiplanar, multiecho pulse sequences of the brain and surrounding structures were obtained without intravenous contrast. COMPARISON:  Comparison made with prior CTs from 10/01/2019. FINDINGS: Brain: Diffuse prominence of the CSF containing spaces compatible with generalized age-related cerebral atrophy. Patchy and confluent T2/FLAIR hyperintensity within the periventricular deep white matter both cerebral hemispheres most consistent with chronic small vessel ischemic disease. Patchy involvement of the deep gray nuclei and pons. Overall, appearance is moderate to advanced in nature. Superimposed remote cortical infarct at the right temporal occipital region. 13 mm focus of diffusion abnormality at the dorsal right thalamus compatible with an acute ischemic infarct (series 5, image 76). Associated signal loss on corresponding ADC map (series 6, image 25). No associated hemorrhage or mass effect. No other evidence for acute or subacute ischemia. Gray-white matter differentiation otherwise maintained. No other areas of remote cortical infarction. No evidence for acute intracranial hemorrhage. No mass lesion, midline shift or mass effect. No hydrocephalus or extra-axial fluid collection. No made of a partially empty sella. Midline structures intact. Vascular: Major intracranial vascular flow voids are maintained. Skull and  upper cervical spine: Craniocervical junction within normal limits. Bone marrow signal intensity normal. No scalp soft tissue abnormality. Sinuses/Orbits: Patient status post bilateral ocular lens replacement. Globes and orbital soft tissues demonstrate no acute finding. Scattered mucosal thickening noted throughout the paranasal sinuses with superimposed left maxillary sinus retention cyst. Trace left mastoid effusion noted, of doubtful significance. Inner ear structures grossly normal. Other: None. IMPRESSION: 1. 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 2. 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 3. Underlying age-related cerebral atrophy with moderate to advanced chronic microvascular ischemic disease. Electronically Signed   By: Rise Mu M.D.   On: 10/02/2019 02:39   CT C-SPINE NO CHARGE  Result Date: 10/01/2019 CLINICAL DATA:  Initial evaluation for acute stroke, left-sided weakness, altered mental status. EXAM: CT ANGIOGRAPHY HEAD AND NECK CT PERFUSION BRAIN TECHNIQUE: Multidetector CT imaging of the head and neck was performed using the standard protocol during bolus administration of intravenous contrast. Multiplanar CT image reconstructions and MIPs were obtained to evaluate the vascular anatomy. Carotid stenosis measurements (when applicable) are  obtained utilizing NASCET criteria, using the distal internal carotid diameter as the denominator. Multiphase CT imaging of the brain was performed following IV bolus contrast injection. Subsequent parametric perfusion maps were calculated using RAPID software. CONTRAST:  OMNIPAQUE IOHEXOL 350 MG/ML SOLN COMPARISON:  Comparison made with prior noncontrast head CT from earlier same day. FINDINGS: CTA NECK FINDINGS Aortic arch: Visualized aortic arch of normal caliber with normal branch pattern. Moderate atheromatous plaque seen about the arch and origin of the great vessels without  hemodynamically significant stenosis. Note made of soft plaque protruding into the lumen of the distal arch/proximal intrathoracic aorta (series 5, image 307). Visualized subclavian arteries widely patent. Right carotid system: Right common carotid artery patent from its origin to the bifurcation without flow-limiting stenosis. Scattered eccentric plaque about the right bifurcation without significant stenosis. Just distally within the proximal right ICA, additional focal plaque is seen with associated short-segment stenosis of up to 50% by NASCET criteria (series 5, image 169). Right ICA tortuous and medialized into the retropharyngeal space. Right ICA otherwise widely patent distally to the skull base without stenosis, dissection or occlusion. Left carotid system: Left common carotid artery patent from its origin to the bifurcation without stenosis. Scattered eccentric mixed plaque about the left bifurcation/proximal left ICA without hemodynamically significant stenosis. Proximal left ICA tortuous and medialized into the retropharyngeal space. Left ICA widely patent distally to the skull base without stenosis, dissection or occlusion. Vertebral arteries: Both vertebral arteries arise from the subclavian arteries. Strongly dominant right vertebral artery with a markedly hypoplastic left vertebral artery noted. Dominant right vertebral artery patent within the neck without stenosis, dissection, or occlusion. Markedly diminutive left vertebral artery patent as well without appreciable stenosis or other vascular abnormality. Skeleton: Alignment: Straightening of the normal cervical lordosis. No listhesis or malalignment. Skull base and vertebrae: Skull base intact. Normal C1-2 articulations are preserved in the dens is intact. Vertebral body heights maintained. No acute fracture. Soft tissues and spinal canal: Soft tissues of the neck demonstrate no acute finding. No abnormal prevertebral edema. Spinal canal within  normal limits. Disc levels: Mild multilevel cervical spondylosis without significant stenosis, most pronounced at C6-7. Upper chest: Visualized upper chest demonstrates no acute finding. Partially visualized lung apices are clear. Other: None. Other neck: No other acute soft tissue abnormality within the neck. No mass lesion or adenopathy. Subcentimeter calcification noted within the left lobe of thyroid. Upper chest: Visualized upper chest demonstrates no acute finding. Partially visualized lungs are grossly clear. Review of the MIP images confirms the above findings CTA HEAD FINDINGS Anterior circulation: Petrous segments widely patent bilaterally. Scattered atheromatous plaque throughout the carotid siphons with relatively mild multifocal stenosis. A1 segments patent bilaterally. Normal anterior communicating artery complex. Anterior cerebral arteries widely patent proximally. Short-segment severe distal right ACA stenosis noted (series 5, image 62). ACAs otherwise widely patent to their distal aspects. No M1 stenosis or occlusion. Normal MCA bifurcations. Distal MCA branches well perfused and symmetric. Posterior circulation: Dominant right vertebral artery patent to the vertebrobasilar junction without stenosis. Right PICA not visualized. Hypoplastic left vertebral artery patent to the vertebrobasilar junction as well. Left PICA patent. Basilar patent to its distal aspect without stenosis. Superior cerebral arteries patent bilaterally. Left PCA supplied via the basilar. Right PCA supplied via a hypoplastic right P1 segment and robust right posterior communicating artery. Both PCAs well perfused to their distal aspects without stenosis. Venous sinuses: Patent. Anatomic variants: Predominant fetal type origin of the right PCA. No intracranial aneurysm. Review of  the MIP images confirms the above findings CT Brain Perfusion Findings: ASPECTS: 10 CBF (<30%) Volume: 0mL Perfusion (Tmax>6.0s) volume: 0mL Mismatch  Volume: 0mL Infarction Location:Negative CT perfusion for acute core infarct or other perfusion deficit. IMPRESSION: CTA HEAD AND NECK IMPRESSION: 1. Negative CTA for emergent large vessel occlusion. 2. 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 3. Single short-segment severe distal right ACA stenosis as above. 4. Additional moderate atheromatous disease about the major arterial vasculature of the head and neck as above. No other hemodynamically significant or correctable stenosis. CT PERFUSION IMPRESSION: Negative CT perfusion. No evidence for acute core infarct or other perfusion deficit. CT CERVICAL SPINE IMPRESSION: No acute traumatic injury within the cervical spine. These results were communicated to Dr. Wilford Corner at approximately 9:00 pmon 6/6/2021by text page via the [facility] messaging system. Electronically Signed   By: Rise Mu M.D.   On: 10/01/2019 21:50   CT Code Stroke Cerebral Perfusion with contrast  Result Date: 10/01/2019 CLINICAL DATA:  Initial evaluation for acute stroke, left-sided weakness, altered mental status. EXAM: CT ANGIOGRAPHY HEAD AND NECK CT PERFUSION BRAIN TECHNIQUE: Multidetector CT imaging of the head and neck was performed using the standard protocol during bolus administration of intravenous contrast. Multiplanar CT image reconstructions and MIPs were obtained to evaluate the vascular anatomy. Carotid stenosis measurements (when applicable) are obtained utilizing NASCET criteria, using the distal internal carotid diameter as the denominator. Multiphase CT imaging of the brain was performed following IV bolus contrast injection. Subsequent parametric perfusion maps were calculated using RAPID software. CONTRAST:  OMNIPAQUE IOHEXOL 350 MG/ML SOLN COMPARISON:  Comparison made with prior noncontrast head CT from earlier same day. FINDINGS: CTA NECK FINDINGS Aortic arch: Visualized aortic arch of  normal caliber with normal branch pattern. Moderate atheromatous plaque seen about the arch and origin of the great vessels without hemodynamically significant stenosis. Note made of soft plaque protruding into the lumen of the distal arch/proximal intrathoracic aorta (series 5, image 307). Visualized subclavian arteries widely patent. Right carotid system: Right common carotid artery patent from its origin to the bifurcation without flow-limiting stenosis. Scattered eccentric plaque about the right bifurcation without significant stenosis. Just distally within the proximal right ICA, additional focal plaque is seen with associated short-segment stenosis of up to 50% by NASCET criteria (series 5, image 169). Right ICA tortuous and medialized into the retropharyngeal space. Right ICA otherwise widely patent distally to the skull base without stenosis, dissection or occlusion. Left carotid system: Left common carotid artery patent from its origin to the bifurcation without stenosis. Scattered eccentric mixed plaque about the left bifurcation/proximal left ICA without hemodynamically significant stenosis. Proximal left ICA tortuous and medialized into the retropharyngeal space. Left ICA widely patent distally to the skull base without stenosis, dissection or occlusion. Vertebral arteries: Both vertebral arteries arise from the subclavian arteries. Strongly dominant right vertebral artery with a markedly hypoplastic left vertebral artery noted. Dominant right vertebral artery patent within the neck without stenosis, dissection, or occlusion. Markedly diminutive left vertebral artery patent as well without appreciable stenosis or other vascular abnormality. Skeleton: Alignment: Straightening of the normal cervical lordosis. No listhesis or malalignment. Skull base and vertebrae: Skull base intact. Normal C1-2 articulations are preserved in the dens is intact. Vertebral body heights maintained. No acute fracture. Soft  tissues and spinal canal: Soft tissues of the neck demonstrate no acute finding. No abnormal prevertebral edema. Spinal canal within normal limits. Disc levels: Mild  multilevel cervical spondylosis without significant stenosis, most pronounced at C6-7. Upper chest: Visualized upper chest demonstrates no acute finding. Partially visualized lung apices are clear. Other: None. Other neck: No other acute soft tissue abnormality within the neck. No mass lesion or adenopathy. Subcentimeter calcification noted within the left lobe of thyroid. Upper chest: Visualized upper chest demonstrates no acute finding. Partially visualized lungs are grossly clear. Review of the MIP images confirms the above findings CTA HEAD FINDINGS Anterior circulation: Petrous segments widely patent bilaterally. Scattered atheromatous plaque throughout the carotid siphons with relatively mild multifocal stenosis. A1 segments patent bilaterally. Normal anterior communicating artery complex. Anterior cerebral arteries widely patent proximally. Short-segment severe distal right ACA stenosis noted (series 5, image 62). ACAs otherwise widely patent to their distal aspects. No M1 stenosis or occlusion. Normal MCA bifurcations. Distal MCA branches well perfused and symmetric. Posterior circulation: Dominant right vertebral artery patent to the vertebrobasilar junction without stenosis. Right PICA not visualized. Hypoplastic left vertebral artery patent to the vertebrobasilar junction as well. Left PICA patent. Basilar patent to its distal aspect without stenosis. Superior cerebral arteries patent bilaterally. Left PCA supplied via the basilar. Right PCA supplied via a hypoplastic right P1 segment and robust right posterior communicating artery. Both PCAs well perfused to their distal aspects without stenosis. Venous sinuses: Patent. Anatomic variants: Predominant fetal type origin of the right PCA. No intracranial aneurysm. Review of the MIP images  confirms the above findings CT Brain Perfusion Findings: ASPECTS: 10 CBF (<30%) Volume: 0mL Perfusion (Tmax>6.0s) volume: 0mL Mismatch Volume: 0mL Infarction Location:Negative CT perfusion for acute core infarct or other perfusion deficit. IMPRESSION: CTA HEAD AND NECK IMPRESSION: 1. Negative CTA for emergent large vessel occlusion. 2. 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 3. Single short-segment severe distal right ACA stenosis as above. 4. Additional moderate atheromatous disease about the major arterial vasculature of the head and neck as above. No other hemodynamically significant or correctable stenosis. CT PERFUSION IMPRESSION: Negative CT perfusion. No evidence for acute core infarct or other perfusion deficit. CT CERVICAL SPINE IMPRESSION: No acute traumatic injury within the cervical spine. These results were communicated to Dr. Wilford Corner at approximately 9:00 pmon 6/6/2021by text page via the [facility] messaging system. Electronically Signed   By: Rise Mu M.D.   On: 10/01/2019 21:50   ECHOCARDIOGRAM COMPLETE  Result Date: 10/02/2019    ECHOCARDIOGRAM REPORT   Patient Name:   AUGUSTINE BRANNICK Date of Exam: 10/02/2019 Medical Rec #:  [id number]      Height:       61.0 in Accession #:    [id number]     Weight:       233.2 lb Date of Birth:  1951/04/27      BSA:          2.016 m Patient Age:    69 years       BP:           123/72 mmHg Patient Gender: F              HR:           90 bpm. Exam Location:  Inpatient Procedure: 2D Echo, Color Doppler, Cardiac Doppler and Intracardiac            Opacification Agent Indications:    Stroke i163.9  History:        Patient has no prior history of Echocardiogram examinations.  Sonographer:  [facility] Senior RDCS Referring Phys: [id number] VASUNDHRA RATHORE IMPRESSIONS  1. There appears to be an oblong 0.5 x 0.75 cm mobile filling defect in the LV apex, which is severely  hypokinetic/aneurysmal, consistent with thrombus.. Left ventricular ejection fraction, by estimation, is 60 to 65%. The left ventricle has normal function. The left ventricle demonstrates regional wall motion abnormalities (see scoring diagram/findings for description). Left ventricular diastolic parameters are consistent with Grade I diastolic dysfunction (impaired relaxation). There is severe akinesis of the left ventricular, apical apical segment.  2. Right ventricular systolic function is normal. The right ventricular size is normal. There is normal pulmonary artery systolic pressure. The estimated right ventricular systolic pressure is 28.8 mmHg.  3. The mitral valve is grossly normal. Trivial mitral valve regurgitation.  4. The aortic valve is tricuspid. Aortic valve regurgitation is not visualized.  5. The inferior vena cava is normal in size with greater than 50% respiratory variability, suggesting right atrial pressure of 3 mmHg. Conclusion(s)/Recommendation(s): LV Apical thrombus is noted, anticoagulation is recommended per current guidelines. FINDINGS  Left Ventricle: There appears to be an oblong 0.5 x 0.75 cm mobile filling defect in the LV apex, which is severely hypokinetic/aneurysmal, consistent with thrombus. Left ventricular ejection fraction, by estimation, is 60 to 65%. The left ventricle has  normal function. The left ventricle demonstrates regional wall motion abnormalities. Severe akinesis of the left ventricular, apical apical segment. Definity contrast agent was given IV to delineate the left ventricular endocardial borders. The left ventricular internal cavity size was normal in size. There is no left ventricular hypertrophy. Left ventricular diastolic parameters are consistent with Grade I diastolic dysfunction (impaired relaxation). Indeterminate filling pressures. Right Ventricle: The right ventricular size is normal. No increase in right ventricular wall thickness. Right ventricular  systolic function is normal. There is normal pulmonary artery systolic pressure. The tricuspid regurgitant velocity is 2.54 m/s, and  with an assumed right atrial pressure of 3 mmHg, the estimated right ventricular systolic pressure is 28.8 mmHg. Left Atrium: Left atrial size was normal in size. Right Atrium: Right atrial size was normal in size. Pericardium: There is no evidence of pericardial effusion. Mitral Valve: The mitral valve is grossly normal. Trivial mitral valve regurgitation. Tricuspid Valve: The tricuspid valve is grossly normal. Tricuspid valve regurgitation is trivial. Aortic Valve: The aortic valve is tricuspid. Aortic valve regurgitation is not visualized. Pulmonic Valve: The pulmonic valve was normal in structure. Pulmonic valve regurgitation is not visualized. Aorta: The aortic root and ascending aorta are structurally normal, with no evidence of dilitation. Venous: The inferior vena cava is normal in size with greater than 50% respiratory variability, suggesting right atrial pressure of 3 mmHg. IAS/Shunts: No atrial level shunt detected by color flow Doppler.  LEFT VENTRICLE PLAX 2D LVIDd:         4.00 cm  Diastology LVIDs:         2.50 cm  LV e' lateral:   6.64 cm/s LV PW:         1.00 cm  LV E/e' lateral: 10.6 LV IVS:        1.00 cm  LV e' medial:    5.66 cm/s LVOT diam:     2.00 cm  LV E/e' medial:  12.4 LV SV:         63 LV SV Index:   31 LVOT Area:     3.14 cm  RIGHT VENTRICLE RV S prime:     15.40 cm/s TAPSE (M-mode): 2.0 cm LEFT ATRIUM  Index       RIGHT ATRIUM           Index LA diam:        3.60 cm 1.79 cm/m  RA Area:     12.30 cm LA Vol (A2C):   46.3 ml 22.96 ml/m RA Volume:   28.50 ml  14.13 ml/m LA Vol (A4C):   42.3 ml 20.98 ml/m LA Biplane Vol: 44.1 ml 21.87 ml/m  AORTIC VALVE LVOT Vmax:   109.00 cm/s LVOT Vmean:  74.000 cm/s LVOT VTI:    0.201 m  AORTA Ao Root diam: 3.00 cm Ao Asc diam:  3.30 cm MITRAL VALVE                TRICUSPID VALVE MV Area (PHT): 3.63 cm      TR Peak grad:   25.8 mmHg MV Decel Time: 209 msec     TR Vmax:        254.00 cm/s MV E velocity: 70.30 cm/s MV A velocity: 112.00 cm/s  SHUNTS MV E/A ratio:  0.63         Systemic VTI:  0.20 m                             Systemic Diam: 2.00 cm Zoila Shutter MD Electronically signed by Zoila Shutter MD Signature Date/Time: 10/02/2019/3:48:08 PM    Final    CT HEAD CODE STROKE WO CONTRAST  Result Date: 10/01/2019 CLINICAL DATA:  Code stroke. Initial evaluation for acute ataxia, left-sided weakness, altered mental status. EXAM: CT HEAD WITHOUT CONTRAST TECHNIQUE: Contiguous axial images were obtained from the base of the skull through the vertex without intravenous contrast. COMPARISON:  None available. FINDINGS: Brain: Generalized age-related cerebral atrophy. Patchy and confluent hypodensity throughout the periventricular deep white matter both cerebral hemispheres, most consistent with chronic small vessel ischemic disease, advanced in nature. Chronic small microvascular ischemic changes noted involving the deep gray nuclei and pons as well. Focal encephalomalacia within the right parieto-occipital region compatible with a chronic ischemic infarct. No acute intracranial hemorrhage. No acute large vessel territory infarct. No mass lesion, midline shift or mass effect. No hydrocephalus or extra-axial fluid collection. Vascular: No hyperdense vessel. Scattered vascular calcifications noted within the carotid siphons. Skull: Scalp soft tissues demonstrate no acute finding. Calvarium intact. Sinuses/Orbits: Globes and orbital soft tissues within normal limits. Left maxillary sinus retention cyst noted. Scattered mucosal thickening noted throughout the sphenoid ethmoidal sinuses, with superimposed air-fluid level within the right sphenoid sinus. Trace left mastoid effusion. Other: None. ASPECTS [facility] Stroke Program Early CT Score) - Ganglionic level infarction (caudate, lentiform nuclei, internal capsule, insula,  M1-M3 cortex): 7 - Supraganglionic infarction (M4-M6 cortex): 3 Total score (0-10 with 10 being normal): 10 IMPRESSION: 1. No acute intracranial infarct or other abnormality. 2. ASPECTS is 10. 3. Chronic right parieto-occipital infarct. 4. Underlying age-related cerebral atrophy with advanced chronic microvascular ischemic disease. Critical Value/emergent results were called by telephone at the time of interpretation on 10/01/2019 at 7:57 pm to provider Dr. Wilford Corner, Who verbally acknowledged these results. Electronically Signed   By: Rise Mu M.D.   On: 10/01/2019 20:10    Review Of Systems Constitutional: No fever, chills, weight loss or gain. Eyes: No vision change, wears glasses. No discharge or pain. Ears: Positive hearing loss, No tinnitus. Respiratory: No asthma, COPD, pneumonias. Positive shortness of breath. No hemoptysis. Cardiovascular: No chest pain, palpitation, leg edema. Gastrointestinal: No nausea, vomiting, diarrhea,  constipation. No GI bleed. No hepatitis. Genitourinary: No dysuria, hematuria, kidney stone. No incontinance. Neurological: Positive headache, stroke, no seizures.  Psychiatry: No psych facility admission for anxiety, depression, suicide. No detox. Skin: No rash. Musculoskeletal: Positive joint pain, no fibromyalgia. Positive neck pain, back pain. Lymphadenopathy: No lymphadenopathy. Hematology: No anemia or easy bruising.   Blood pressure 108/74, pulse 77, temperature 98.2 F (36.8 C), temperature source Oral, resp. rate 18, height 5\' 1"  (1.549 m), weight 105.8 kg, SpO2 96 %. Body mass index is 44.07 kg/m. General appearance: alert, cooperative, appears stated age and no distress Head: Normocephalic, atraumatic. Eyes: Light Brown eyes, pink conjunctiva, corneas clear.  Neck: No adenopathy, no carotid bruit, no JVD, supple, symmetrical, trachea midline and thyroid not enlarged. Resp: Clear to auscultation bilaterally. Cardio: Regular rate and rhythm,  S1, S2 normal, III/VI systolic murmur, no click, rub or gallop GI: Soft, non-tender; bowel sounds normal; no organomegaly. Extremities: No edema, cyanosis or clubbing. Skin: Warm and dry.  Neurologic: Alert and oriented X 2. Denies loss of vision. Speech is thick. Mild left sided weakness and numbness.  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 On Apixaban 10 mg. did x 1 week then 5 mg. bid for 3-6 months Possible rehab admission F/U office in 1 week post discharge. Repeat echocardiogram in 3 months. Additional cardiac interventions in future when patient is stable and ready  Time spent: Review of old records, Lab, x-rays, EKG, other cardiac tests, examination, discussion with patient, Daughter, Other consultant over 70 minutes.  Ricki RodriguezAjay S Thomasenia Dowse, MD  10/03/2019, 6:51 PM

## 2019-10-03 NOTE — [place] FL2 (Signed)
[place] MEDICAID FL2 LEVEL OF CARE SCREENING TOOL     IDENTIFICATION  Patient Name: [name] Good Birthdate: 10-25-1950 Sex: female Admission Date (Current Location): 10/01/2019  [facility] and [place] Number:  Producer, television/film/video and Address:  The . [facility], [address] [place], [place] [address]      Provider Number: [id number]  Attending Physician Name and Address:  Marinda Elk, MD  Relative Name and Phone Number:  Delsy Etzkorn [phone]    Current Level of Care: Hospital Recommended Level of Care: Skilled Nursing Facility Prior Approval Number:    Date Approved/Denied:   PASRR Number: [id number]  Discharge Plan: SNF    Current Diagnoses: Patient Active Problem List   Diagnosis Date Noted  . Severe dehydration 10/02/2019  . Grief reaction 10/02/2019  . RLS (restless legs syndrome) 10/02/2019  . Acute CVA (cerebrovascular accident) (HCC) 10/01/2019  . Hypokalemia 10/01/2019  . Cellulitis of left upper extremity 10/18/2017  . Arthritis 10/18/2017  . Septic shock (HCC) 10/16/2017  . Former smoker 11/13/2016  . History of restless legs syndrome 11/13/2016  . Nontraumatic incomplete tear of right rotator cuff 08/18/2016  . Autoimmune disease (HCC) 08/12/2016  . Primary osteoarthritis of both knees 08/12/2016  . Primary osteoarthritis of both feet 08/12/2016  . Obesity (BMI 35.0-39.9 without comorbidity) 08/12/2016  . Osteoarthritis of right hip 10/25/2015  . 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 S/P lumbar laminectomy 06/14/2015    Orientation RESPIRATION BLADDER Height & Weight     Self, Time, Place  Normal Incontinent Weight: 233 lb 4 oz (105.8 kg) Height:  5\' 1"  (154.9 cm)  BEHAVIORAL SYMPTOMS/MOOD NEUROLOGICAL BOWEL NUTRITION STATUS      Incontinent Diet(See discharge Summary)  AMBULATORY STATUS COMMUNICATION OF NEEDS Skin   Extensive Assist Verbally Normal                        Personal Care Assistance Level of Assistance  Bathing, Feeding, Dressing Bathing Assistance: Maximum assistance Feeding assistance: Independent Dressing Assistance: Maximum assistance     Functional Limitations Info  Sight, Hearing, Speech Sight Info: Adequate Hearing Info: Adequate Speech Info: Adequate    SPECIAL CARE FACTORS FREQUENCY  PT (By licensed PT), OT (By licensed OT)     PT Frequency: 5x week OT Frequency: 5x week            Contractures Contractures Info: Not present    Additional Factors Info  Code Status, Allergies Code Status Info: Full Allergies Info: Amoxicillin           Current Medications (10/03/2019):  This is the current hospital active medication list Current Facility-Administered Medications  Medication Dose Route Frequency Provider Last Rate Last Admin  .  stroke: mapping our early stages of recovery book   Does not apply Once 12/03/2019, MD      . acetaminophen (TYLENOL) tablet 650 mg  650 mg Oral Q4H PRN John Giovanni, MD   650 mg at 10/03/19 0318   Or  . acetaminophen (TYLENOL) 160 MG/5ML solution 650 mg  650 mg Per Tube Q4H PRN 12/03/19, MD   650 mg at 10/02/19 2002   Or  . acetaminophen (TYLENOL) suppository 650 mg  650 mg Rectal Q4H PRN 2003, MD      . aspirin EC tablet 81 mg  81 mg Oral Daily John Giovanni L, NP   81 mg at 10/03/19 1023  . atorvastatin (LIPITOR)  tablet 80 mg  80 mg Oral Daily Shela Leff, MD   80 mg at 10/03/19 1023  . clopidogrel (PLAVIX) tablet 75 mg  75 mg Oral Daily Burnetta Sabin L, NP   75 mg at 10/03/19 1023  . enoxaparin (LOVENOX) injection 40 mg  40 mg Subcutaneous Q24H Shela Leff, MD   40 mg at 10/03/19 1023  . feeding supplement (BOOST / RESOURCE BREEZE) liquid 1 Container  1 Container Oral TID BM Charlynne Cousins, MD      . methocarbamol (ROBAXIN) tablet 500 mg  500 mg Oral Q8H PRN Lang Snow, FNP   500 mg at 10/03/19 0123  . multivitamin with  minerals tablet 1 tablet  1 tablet Oral Daily Charlynne Cousins, MD      . potassium chloride SA (KLOR-CON) CR tablet 20 mEq  20 mEq Oral BID Charlynne Cousins, MD      . rOPINIRole (REQUIP) tablet 3 mg  3 mg Oral QHS PRN Shela Leff, MD   3 mg at 10/02/19 1955     Discharge Medications: Please see discharge summary for a list of discharge medications.  Relevant Imaging Results:  Relevant Lab Results:   Additional Information SS# [place], Student-Social Work

## 2019-10-03 NOTE — Progress Notes (Signed)
STROKE TEAM PROGRESS NOTE   INTERVAL HISTORY No one is at the bedside.  Patient states she still has mild left-sided weakness and sensory loss.  No new complaints.  LDL cholesterol is 1 1 8  mg percent and hemoglobin A1c is 5.6.  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  Ejection fraction is normal at 60 to 65%. Vitals:   10/02/19 2337 10/03/19 0346 10/03/19 0921 10/03/19 1150  BP: (!) 149/55 131/72 118/60 (!) 124/57  Pulse: 83 85 87 90  Resp: 18 18 17 18   Temp: 98.8 F (37.1 C) 98.1 F (36.7 C) 97.7 F (36.5 C) 98.6 F (37 C)  TempSrc: Oral Oral Oral Oral  SpO2: 99% 97% 97% 97%  Weight:      Height:        CBC:  Recent Labs  Lab 10/01/19 2056 10/01/19 2105  WBC 7.3  --   NEUTROABS 4.8  --   HGB 14.0 13.6  HCT 40.7 40.0  MCV 91.5  --   PLT 158  --     Basic Metabolic Panel:  Recent Labs  Lab 10/01/19 2056 10/01/19 2056 10/01/19 2105 10/02/19 0630  NA 138   < > 140 140  K 2.3*   < > 2.3* 3.5  CL 102   < > 102 107  CO2 22  --   --  23  GLUCOSE 106*   < > 101* 107*  BUN 14   < > 15 8  CREATININE 1.08*   < > 0.80 0.71  CALCIUM 8.9  --   --  8.5*  MG  --   --   --  1.7   < > = values in this interval not displayed.   Lipid Panel:     Component Value Date/Time   CHOL 176 10/02/2019 0630   TRIG 102 10/02/2019 0630   HDL 38 (L) 10/02/2019 0630   CHOLHDL 4.6 10/02/2019 0630   VLDL 20 10/02/2019 0630   LDLCALC 118 (H) 10/02/2019 0630   HgbA1c:  Lab Results  Component Value Date   HGBA1C 5.7 (H) 10/02/2019   Urine Drug Screen: No results found for: LABOPIA, COCAINSCRNUR, LABBENZ, AMPHETMU, THCU, LABBARB  Alcohol Level No results found for: [facility]  IMAGING past 24 hours ECHOCARDIOGRAM COMPLETE  Result Date: 10/02/2019    ECHOCARDIOGRAM REPORT   Patient Name:   KATTALEYA Good Date of Exam: 10/02/2019 Medical Rec #:  Octaviano Glow      Height:       61.0 in Accession #:     12/02/2019     Weight:       233.2 lb Date of Birth:  1950/05/08      BSA:          2.016 m Patient Age:    69 years       BP:           123/72 mmHg Patient Gender: F              HR:           90 bpm. Exam Location:  Inpatient Procedure: 2D Echo, Color Doppler, Cardiac Doppler and Intracardiac            Opacification Agent Indications:    Stroke i163.9  History:        Patient has no prior history of Echocardiogram examinations.  Sonographer:    [id number] Senior  RDCS Referring Phys: [id number] VASUNDHRA RATHORE IMPRESSIONS  1. There appears to be an oblong 0.5 x 0.75 cm mobile filling defect in the LV apex, which is severely hypokinetic/aneurysmal, consistent with thrombus.. Left ventricular ejection fraction, by estimation, is 60 to 65%. The left ventricle has normal function. The left ventricle demonstrates regional wall motion abnormalities (see scoring diagram/findings for description). Left ventricular diastolic parameters are consistent with Grade I diastolic dysfunction (impaired relaxation). There is severe akinesis of the left ventricular, apical apical segment.  2. Right ventricular systolic function is normal. The right ventricular size is normal. There is normal pulmonary artery systolic pressure. The estimated right ventricular systolic pressure is 28.8 mmHg.  3. The mitral valve is grossly normal. Trivial mitral valve regurgitation.  4. The aortic valve is tricuspid. Aortic valve regurgitation is not visualized.  5. The inferior vena cava is normal in size with greater than 50% respiratory variability, suggesting right atrial pressure of 3 mmHg. Conclusion(s)/Recommendation(s): LV Apical thrombus is noted, anticoagulation is recommended per current guidelines. FINDINGS  Left Ventricle: There appears to be an oblong 0.5 x 0.75 cm mobile filling defect in the LV apex, which is severely hypokinetic/aneurysmal, consistent with thrombus. Left ventricular ejection fraction, by estimation, is 60 to 65%. The left  ventricle has  normal function. The left ventricle demonstrates regional wall motion abnormalities. Severe akinesis of the left ventricular, apical apical segment. Definity contrast agent was given IV to delineate the left ventricular endocardial borders. The left ventricular internal cavity size was normal in size. There is no left ventricular hypertrophy. Left ventricular diastolic parameters are consistent with Grade I diastolic dysfunction (impaired relaxation). Indeterminate filling pressures. Right Ventricle: The right ventricular size is normal. No increase in right ventricular wall thickness. Right ventricular systolic function is normal. There is normal pulmonary artery systolic pressure. The tricuspid regurgitant velocity is 2.54 m/s, and  with an assumed right atrial pressure of 3 mmHg, the estimated right ventricular systolic pressure is 28.8 mmHg. Left Atrium: Left atrial size was normal in size. Right Atrium: Right atrial size was normal in size. Pericardium: There is no evidence of pericardial effusion. Mitral Valve: The mitral valve is grossly normal. Trivial mitral valve regurgitation. Tricuspid Valve: The tricuspid valve is grossly normal. Tricuspid valve regurgitation is trivial. Aortic Valve: The aortic valve is tricuspid. Aortic valve regurgitation is not visualized. Pulmonic Valve: The pulmonic valve was normal in structure. Pulmonic valve regurgitation is not visualized. Aorta: The aortic root and ascending aorta are structurally normal, with no evidence of dilitation. Venous: The inferior vena cava is normal in size with greater than 50% respiratory variability, suggesting right atrial pressure of 3 mmHg. IAS/Shunts: No atrial level shunt detected by color flow Doppler.  LEFT VENTRICLE PLAX 2D LVIDd:         4.00 cm  Diastology LVIDs:         2.50 cm  LV e' lateral:   6.64 cm/s LV PW:         1.00 cm  LV E/e' lateral: 10.6 LV IVS:        1.00 cm  LV e' medial:    5.66 cm/s LVOT diam:     2.00  cm  LV E/e' medial:  12.4 LV SV:         63 LV SV Index:   31 LVOT Area:     3.14 cm  RIGHT VENTRICLE RV S prime:     15.40 cm/s TAPSE (M-mode): 2.0 cm LEFT ATRIUM  Index       RIGHT ATRIUM           Index LA diam:        3.60 cm 1.79 cm/m  RA Area:     12.30 cm LA Vol (A2C):   46.3 ml 22.96 ml/m RA Volume:   28.50 ml  14.13 ml/m LA Vol (A4C):   42.3 ml 20.98 ml/m LA Biplane Vol: 44.1 ml 21.87 ml/m  AORTIC VALVE LVOT Vmax:   109.00 cm/s LVOT Vmean:  74.000 cm/s LVOT VTI:    0.201 m  AORTA Ao Root diam: 3.00 cm Ao Asc diam:  3.30 cm MITRAL VALVE                TRICUSPID VALVE MV Area (PHT): 3.63 cm     TR Peak grad:   25.8 mmHg MV Decel Time: 209 msec     TR Vmax:        254.00 cm/s MV E velocity: 70.30 cm/s MV A velocity: 112.00 cm/s  SHUNTS MV E/A ratio:  0.63         Systemic VTI:  0.20 m                             Systemic Diam: 2.00 cm Lyman Bishop MD Electronically signed by Lyman Bishop MD Signature Date/Time: 10/02/2019/3:48:08 PM    Final     PHYSICAL EXAM Pleasant elderly Caucasian lady not in distress. . Afebrile. Head is nontraumatic. Neck is supple without bruit.    Cardiac exam no murmur or gallop. Lungs are clear to auscultation. Distal pulses are well felt. Neurological Exam : Awake but drowsy can be easily aroused.  Has right gaze preference but is able to look to the left all the way past midline.  Has mild left-sided neglect.  Has mild dysarthria but can be understood.  Left lower facial weakness.  Tongue midline.  Mild left hemiparesis 4/5 strength with weakness of left grip and intrinsic hand muscles as well as left hip flexors and ankle dorsiflexors.  Mild decrease sensation on the left.  Sensory extinction on the left.  Left plantar equivocal right downgoing.  Gait not tested. ASSESSMENT/PLAN [name] is a 69 y.o. female with history of CAD, depression, arthritis, GERD, RLS presenting with L sided weakness.   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 Small vessel disease. Atrophy. Old R parietal occipital infarct. ASPECTS 10.     CT CS no acute trauma  CTA head & neck no LVO. Proximal R ICA 50% stenosis. Severe distal R ACA short segment stenosis. Moderate atherosclerosis head and neck.   CT perfusion no core infarct, no perfusion deficits  MRI  R thalamic infarct. Old R temporal occipital cortical infarct. Small vessel disease. Atrophy.   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 Treat w/ DAPT x 3 weeks then Aspirin alone. Orders adjusted  Therapy recommendations:  pending   Disposition:  pending   Patient interested in participating in the [facility] will follow up for possible enrollment. Please contact them at  [phone] for any questions.    Hypertension  Stable . Permissive hypertension (OK if < 220/120) but gradually normalize in 5-7 days . Long-term BP goal normotensive  Hyperlipidemia  Home meds:  No statin  Now on lipitor 80  LDL 118, goal < 70  Continue statin at discharge  Other Stroke Risk Factors  Advanced age  Former Cigarette smoker  Current ETOH use  Morbid Obesity, Body mass index is 44.07 kg/m., recommend weight loss, diet and exercise as appropriate   Coronary artery disease - MI  Other Active Problems  Severe hypokalemia d/t poor intake d/t grief  Severe dehydration d/t poor intake d/t grief  Acute grief (recent death of her mother)  RLS on requip  Hospital day # 2 Patient has presented with left hemiparesis, and attention and sensory loss due to right thalamic infarct likely from small vessel disease.  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  Discussed with Dr. Darin Engels.  Greater than 50% time during this 25-minute visit was spent in counseling and coordination of care about her stroke and discussion with stroke prevention and treatment and answering questions.  Recommend cardiology consult for LV thrombus.  Stroke team will sign off kindly call for questions. Delia Heady, MD To contact Stroke Continuity provider, please refer to [email or web address]. After hours, contact General Neurology

## 2019-10-03 NOTE — Plan of Care (Signed)
  Problem: Nutrition: Goal: Risk of aspiration will decrease Outcome: Progressing   

## 2019-10-04 ENCOUNTER — Other Ambulatory Visit: Payer: Medicare Other

## 2019-10-04 LAB — PHOSPHORUS: Phosphorus: 2.4 mg/dL — ABNORMAL LOW (ref 2.5–4.6)

## 2019-10-04 LAB — MAGNESIUM: Magnesium: 1.8 mg/dL (ref 1.7–2.4)

## 2019-10-04 LAB — BASIC METABOLIC PANEL
Anion gap: 9 (ref 5–15)
BUN: 8 mg/dL (ref 8–23)
CO2: 24 mmol/L (ref 22–32)
Calcium: 9 mg/dL (ref 8.9–10.3)
Chloride: 105 mmol/L (ref 98–111)
Creatinine, Ser: 0.61 mg/dL (ref 0.44–1.00)
GFR calc Af Amer: >60 mL/min (ref >60)
GFR calc non Af Amer: >60 mL/min (ref >60)
Glucose, Bld: 114 mg/dL — ABNORMAL HIGH (ref 70–99)
Potassium: 4.1 mmol/L (ref 3.5–5.1)
Sodium: 138 mmol/L (ref 135–145)

## 2019-10-04 MED ORDER — OXYCODONE-ACETAMINOPHEN 5-325 MG PO TABS
1.0000 | ORAL_TABLET | Freq: Four times a day (QID) | ORAL | Status: DC | PRN
  Administered 2019-10-04 – 2019-10-05 (×3): 1 via ORAL
  Filled 2019-10-04 (×3): qty 1

## 2019-10-04 MED ORDER — MELATONIN 3 MG PO TABS
3.0000 mg | ORAL_TABLET | Freq: Every day | ORAL | Status: DC
  Administered 2019-10-04: 3 mg via ORAL
  Filled 2019-10-04: qty 1

## 2019-10-04 NOTE — Progress Notes (Signed)
Inpatient Rehabilitation Admissions Coordinator  I met with patient at bedside and contacted her daughter, Sabrea, by phone. We discussed goals and expectations of a possible inpt rehab admit. They prefer CIR rather than SNF. I have begun insurance authorization and await their decision to proceed.  Danne Baxter, RN, MSN Rehab Admissions Coordinator [phone] 10/04/2019 3:00 PM

## 2019-10-04 NOTE — Progress Notes (Signed)
PROGRESS NOTE    [name] Good  DTO:[id number] DOB: 17-Jun-1950 DOA: 10/01/2019 PCP: Kaleen Mask, MD    Brief Narrative:  69 year old female with history of coronary artery disease, depression, GERD, restless leg syndrome presented to the emergency room as a code stroke for acute left-sided weakness.  Found at home.  She was on the floor, found with slurred speech and left-sided weakness.  Outside TPA window on arrival.  CT head negative for acute CVA.  CTA of the head and neck was negative for large vessel occlusion.  Potassium was 2.3.   Assessment & Plan:   Principal Problem:   Acute CVA (cerebrovascular accident) (HCC) Active Problems:   Hypokalemia   Severe dehydration   Grief reaction   RLS (restless legs syndrome)  Acute ischemic stroke, right MCA territory stroke: Clinical findings, slurred speech, left-sided weakness. CT head findings, initial CT scan negative. MRI of the brain, right thalamic stroke.  Old right temporal occipital cortical infarct.  Small vessel disease. CTA of the head and neck, no large vessel occlusion.  Proximal right ICA 50% stenosis.  Distal right ACA severe stenosis. 2D echocardiogram, hypokinetic aneurysmal segment consistent with thrombus on the left ventricular apex. Antiplatelet therapy, was taking aspirin at home.  Now started on Eliquis. LDL, 118, started on Lipitor. Hemoglobin A1c, 5.7. 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  Recommended anticoagulation with high-dose Eliquis followed by Eliquis 5 mg twice a day.  Repeat echocardiogram in 3 months.  Severe hypokalemia: Due to poor intake from recent grief.  Replaced adequately with improvement.  Magnesium normal.  Acute kidney injury: Improved.  Restless leg syndrome: On Requip.  Continue.  DVT prophylaxis: Eliquis Code Status: Full code Family Communication: None today Disposition Plan: Status is:  Inpatient  Remains inpatient appropriate because:Unsafe d/c plan   Dispo: The patient is from: Home              Anticipated d/c is to: CIR              Anticipated d/c date is: 1 day              Patient currently is medically stable to d/c.  Patient is medically stable to transfer to inpatient rehab level of care.    Consultants:   Neurology  Cardiology  Procedures:   None  Antimicrobials:   None   Subjective: Patient seen and examined.  No overnight events.  Poor historian.  She remains very quiet and withdrawn.  Objective: Vitals:   10/03/19 2348 10/04/19 0303 10/04/19 0817 10/04/19 1238  BP: (!) 161/89 (!) 150/79 134/79 (!) 148/81  Pulse: 96 92 97 91  Resp: 15 17 18 18   Temp: 98.3 F (36.8 C) 98.2 F (36.8 C) 98.1 F (36.7 C) 98 F (36.7 C)  TempSrc: Oral Oral Oral Oral  SpO2: 99% 100% 100% 100%  Weight:      Height:       No intake or output data in the 24 hours ending 10/04/19 1321 Filed Weights   10/02/19 0252  Weight: 105.8 kg    Examination:  General exam: Appears calm and comfortable, chronically sick looking.  Anxious with flat affect. Respiratory system: Clear to auscultation. Respiratory effort normal. Cardiovascular system: S1 & S2 heard, RRR. No JVD, murmurs, rubs, gallops or clicks. No pedal edema. Central nervous system: Alert and oriented.  Left sided hemiparesis, 4/5. Skin: No rashes, lesions or ulcers  Data Reviewed: I have  personally reviewed following labs and imaging studies  CBC: Recent Labs  Lab 10/01/19 2056 10/01/19 2105  WBC 7.3  --   NEUTROABS 4.8  --   HGB 14.0 13.6  HCT 40.7 40.0  MCV 91.5  --   PLT 158  --    Basic Metabolic Panel: Recent Labs  Lab 10/01/19 2056 10/01/19 2105 10/02/19 0630 10/04/19 0804  NA 138 140 140 138  K 2.3* 2.3* 3.5 4.1  CL 102 102 107 105  CO2 22  --  23 24  GLUCOSE 106* 101* 107* 114*  BUN 14 15 8 8   CREATININE 1.08* 0.80 0.71 0.61  CALCIUM 8.9  --  8.5* 9.0  MG  --   --   1.7 1.8  PHOS  --   --   --  2.4*   GFR: Estimated Creatinine Clearance: 73.3 mL/min (by C-G formula based on SCr of 0.61 mg/dL). Liver Function Tests: Recent Labs  Lab 10/01/19 2056  AST 35  ALT 23  ALKPHOS 88  BILITOT 1.0  PROT 6.1*  ALBUMIN 3.5   No results for input(s): LIPASE, AMYLASE in the last 168 hours. No results for input(s): AMMONIA in the last 168 hours. Coagulation Profile: Recent Labs  Lab 10/01/19 2056  INR 1.1   Cardiac Enzymes: No results for input(s): CKTOTAL, CKMB, CKMBINDEX, TROPONINI in the last 168 hours. BNP (last 3 results) No results for input(s): PROBNP in the last 8760 hours. HbA1C: Recent Labs    10/02/19 0630  HGBA1C 5.7*   CBG: Recent Labs  Lab 10/01/19 2256  GLUCAP 97   Lipid Profile: Recent Labs    10/02/19 0630  CHOL 176  HDL 38*  LDLCALC 118*  TRIG 102  CHOLHDL 4.6   Thyroid Function Tests: No results for input(s): TSH, T4TOTAL, FREET4, T3FREE, THYROIDAB in the last 72 hours. Anemia Panel: No results for input(s): VITAMINB12, FOLATE, FERRITIN, TIBC, IRON, RETICCTPCT in the last 72 hours. Sepsis Labs: No results for input(s): PROCALCITON, LATICACIDVEN in the last 168 hours.  No results found for this or any previous visit (from the past 240 hour(s)).       Radiology Studies: ECHOCARDIOGRAM COMPLETE  Result Date: 10/02/2019    ECHOCARDIOGRAM REPORT   Patient Name:   MISSIE GEHRIG Date of Exam: 10/02/2019 Medical Rec #:  12/02/2019      Height:       61.0 in Accession #:    [id number]     Weight:       233.2 lb Date of Birth:  Nov 04, 1950      BSA:          2.016 m Patient Age:    69 years       BP:           123/72 mmHg Patient Gender: F              HR:           90 bpm. Exam Location:  Inpatient Procedure: 2D Echo, Color Doppler, Cardiac Doppler and Intracardiac            Opacification Agent Indications:    Stroke i163.9  History:        Patient has no prior history of Echocardiogram examinations.  Sonographer:     08/06/1950 Senior RDCS Referring Phys: Irving Burton VASUNDHRA RATHORE IMPRESSIONS  1. There appears to be an oblong 0.5 x 0.75 cm mobile filling defect in the LV apex, which is severely hypokinetic/aneurysmal, consistent with [id number]  Left ventricular ejection fraction, by estimation, is 60 to 65%. The left ventricle has normal function. The left ventricle demonstrates regional wall motion abnormalities (see scoring diagram/findings for description). Left ventricular diastolic parameters are consistent with Grade I diastolic dysfunction (impaired relaxation). There is severe akinesis of the left ventricular, apical apical segment.  2. Right ventricular systolic function is normal. The right ventricular size is normal. There is normal pulmonary artery systolic pressure. The estimated right ventricular systolic pressure is 28.8 mmHg.  3. The mitral valve is grossly normal. Trivial mitral valve regurgitation.  4. The aortic valve is tricuspid. Aortic valve regurgitation is not visualized.  5. The inferior vena cava is normal in size with greater than 50% respiratory variability, suggesting right atrial pressure of 3 mmHg. Conclusion(s)/Recommendation(s): LV Apical thrombus is noted, anticoagulation is recommended per current guidelines. FINDINGS  Left Ventricle: There appears to be an oblong 0.5 x 0.75 cm mobile filling defect in the LV apex, which is severely hypokinetic/aneurysmal, consistent with thrombus. Left ventricular ejection fraction, by estimation, is 60 to 65%. The left ventricle has  normal function. The left ventricle demonstrates regional wall motion abnormalities. Severe akinesis of the left ventricular, apical apical segment. Definity contrast agent was given IV to delineate the left ventricular endocardial borders. The left ventricular internal cavity size was normal in size. There is no left ventricular hypertrophy. Left ventricular diastolic parameters are consistent with Grade I diastolic dysfunction  (impaired relaxation). Indeterminate filling pressures. Right Ventricle: The right ventricular size is normal. No increase in right ventricular wall thickness. Right ventricular systolic function is normal. There is normal pulmonary artery systolic pressure. The tricuspid regurgitant velocity is 2.54 m/s, and  with an assumed right atrial pressure of 3 mmHg, the estimated right ventricular systolic pressure is 28.8 mmHg. Left Atrium: Left atrial size was normal in size. Right Atrium: Right atrial size was normal in size. Pericardium: There is no evidence of pericardial effusion. Mitral Valve: The mitral valve is grossly normal. Trivial mitral valve regurgitation. Tricuspid Valve: The tricuspid valve is grossly normal. Tricuspid valve regurgitation is trivial. Aortic Valve: The aortic valve is tricuspid. Aortic valve regurgitation is not visualized. Pulmonic Valve: The pulmonic valve was normal in structure. Pulmonic valve regurgitation is not visualized. Aorta: The aortic root and ascending aorta are structurally normal, with no evidence of dilitation. Venous: The inferior vena cava is normal in size with greater than 50% respiratory variability, suggesting right atrial pressure of 3 mmHg. IAS/Shunts: No atrial level shunt detected by color flow Doppler.  LEFT VENTRICLE PLAX 2D LVIDd:         4.00 cm  Diastology LVIDs:         2.50 cm  LV e' lateral:   6.64 cm/s LV PW:         1.00 cm  LV E/e' lateral: 10.6 LV IVS:        1.00 cm  LV e' medial:    5.66 cm/s LVOT diam:     2.00 cm  LV E/e' medial:  12.4 LV SV:         63 LV SV Index:   31 LVOT Area:     3.14 cm  RIGHT VENTRICLE RV S prime:     15.40 cm/s TAPSE (M-mode): 2.0 cm LEFT ATRIUM             Index       RIGHT ATRIUM           Index LA diam:  3.60 cm 1.79 cm/m  Good Area:     12.30 cm LA Vol (A2C):   46.3 ml 22.96 ml/m Good Volume:   28.50 ml  14.13 ml/m LA Vol (A4C):   42.3 ml 20.98 ml/m LA Biplane Vol: 44.1 ml 21.87 ml/m  AORTIC VALVE LVOT Vmax:    109.00 cm/s LVOT Vmean:  74.000 cm/s LVOT VTI:    0.201 m  AORTA Ao Root diam: 3.00 cm Ao Asc diam:  3.30 cm MITRAL VALVE                TRICUSPID VALVE MV Area (PHT): 3.63 cm     TR Peak grad:   25.8 mmHg MV Decel Time: 209 msec     TR Vmax:        254.00 cm/s MV E velocity: 70.30 cm/s MV A velocity: 112.00 cm/s  SHUNTS MV E/A ratio:  0.63         Systemic VTI:  0.20 m                             Systemic Diam: 2.00 cm Lyman Bishop MD Electronically signed by Lyman Bishop MD Signature Date/Time: 10/02/2019/3:48:08 PM    Final         Scheduled Meds: .  stroke: mapping our early stages of recovery book   Does not apply Once  . apixaban  10 mg Oral BID   Followed by  . [START ON 10/10/2019] apixaban  5 mg Oral BID  . atorvastatin  80 mg Oral Daily  . feeding supplement  1 Container Oral TID BM  . multivitamin with minerals  1 tablet Oral Daily  . potassium chloride  20 mEq Oral BID   Continuous Infusions:   LOS: 3 days    Time spent: 30 minutes    Barb Merino, MD [facility] Pager [phone]

## 2019-10-04 NOTE — TOC Benefit Eligibility Note (Signed)
Transition of Care [facility]) Benefit Eligibility Note    Patient Details  Name: [name] Good MRN: [id number] Date of Birth: [birth year]   Medication/Dose: Arne Cleveland  5 MG BID  Covered?: Yes  Tier: 3 Drug  Prescription Coverage [facility]erred [facility]: [facility]  [place] with Person/Company/Phone Number:: LKHVF  @ [employer] # [phone]  Co-Pay: $47.00  Prior Approval: No  Deductible: Met(OUT-OF-POCKET: Linton Ham Phone Number: 10/04/2019, 5:00 PM

## 2019-10-04 NOTE — Progress Notes (Signed)
Transitions of Care Pharmacist Note  [name] Good is a 69 y.o. female that has been diagnosed with LV thombus and will be prescribed Eliquis (apixaban) at discharge.   Patient Education: I provided the following education on Apixaban 10/04/19 to the patient: How to take the medication Described what the medication is Signs of bleeding Signs/symptoms of VTE and stroke  Answered their questions  Discharge Medications Plan: The patient wants to have their discharge medications filled by the Transitions of Care pharmacy rather than their usual pharmacy.  The discharge orders pharmacy has been changed to the Transitions of Care pharmacy, the patient will receive a phone call regarding co-pay, and their medications will be delivered by the Transitions of Care pharmacy.    Thank you,   Alvia Grove, PharmD PGY1 Acute Care Pharmacy Resident October 04, 2019

## 2019-10-04 NOTE — Progress Notes (Signed)
Physical Therapy Treatment Patient Details Name: [name] Good MRN: [id number] DOB: Aug 10, 1950 Today's Date: 10/04/2019    History of Present Illness Pt is a 69 y/o female with PMH of CAD, depression, arthritis, RLS, MI, prior lumbar fusion, R THA, presenting for acute onset on L sided weakness and slurred speech. CT negative for acute infarct. 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  Pt also found with severe hypokalemia and dehydration; noted recent loss of her mother and is grieving.     PT Comments    Patient with improved balance and orienting to midline with min cues. Remains internally distacted by thinking she sees and feels something on her left side that she wants to remove. Able to stand-pivot with +2 mod assist for balance and advancing RLE to step to chair  Follow Up Recommendations  CIR     Equipment Recommendations  Other (comment)(TBD at next venue)    Recommendations for Other Services Rehab consult;Speech consult     Precautions / Restrictions Precautions Precautions: Fall Restrictions Weight Bearing Restrictions: No    Mobility  Bed Mobility Overal bed mobility: Needs Assistance Bed Mobility: Supine to Sit     Supine to sit: Min assist     General bed mobility comments: min assist for safety/balance, scooting foward; cueing for sequencing and technique using bed rails with increased time required  Transfers Overall transfer level: Needs assistance Equipment used: 2 person hand held assist Transfers: Sit to/from Stand Sit to Stand: Mod assist;+2 physical assistance Stand pivot transfers: Mod assist;+2 physical assistance;+2 safety/equipment       General transfer comment: mod assist +2 to power up and steady with cueing for positioning, technique, forward lean; pivoting towards R side with step by step cueing and tactile support to initate movement of R LE   Ambulation/Gait                  Stairs             Wheelchair Mobility    Modified Rankin (Stroke Patients Only) Modified Rankin (Stroke Patients Only) Pre-Morbid Rankin Score: No significant disability Modified Rankin: Severe disability     Balance Overall balance assessment: Needs assistance Sitting-balance support: No upper extremity supported;Feet supported Sitting balance-Leahy Scale: Poor Sitting balance - Comments: min guard statically for safety, slight R Lateral lean but much improved midline positioniing today  Postural control: Right lateral lean Standing balance support: Bilateral upper extremity supported Standing balance-Leahy Scale: Poor Standing balance comment: relies on BUE and external support, continues to have posterior lean but able to maintain with support                            Cognition Arousal/Alertness: Awake/alert Behavior During Therapy: Anxious;Impulsive;Restless Overall Cognitive Status: Impaired/Different from baseline Area of Impairment: Attention;Safety/judgement;Awareness;Problem solving                   Current Attention Level: Focused   Following Commands: Follows one step commands consistently;Follows one step commands with increased time;Follows multi-step commands inconsistently Safety/Judgement: Decreased awareness of safety;Decreased awareness of deficits Awareness: Intellectual Problem Solving: Difficulty sequencing;Requires verbal cues;Requires tactile cues General Comments: patient reports increased confusion today voicing "I didn't know where I was this morning" but pt oriented during session, patient requires 1 step cueing, constant redirection to task as perseverating on "tightness" of gown on L side       Exercises  General Comments General comments (skin integrity, edema, etc.): BP seated in recliner 139/68; pt reports mild dizziness and nausea at completion of session but fades; fatigues easily today       Pertinent Vitals/Pain Pain Assessment: Faces Faces Pain Scale: Hurts little more Pain Location: bil feet; below ankles Pain Descriptors / Indicators: Discomfort Pain Intervention(s): Limited activity within patient's tolerance;Monitored during session;Repositioned    Home Living     Available Help at Discharge: Family Type of Home: Mobile home              Prior Function            PT Goals (current goals can now be found in the care plan section) Acute Rehab PT Goals Patient Stated Goal: to go home with [facility] therapies PT Goal Formulation: (general concepts as she could not attend to specifics) Time For Goal Achievement: 10/17/19 Potential to Achieve Goals: Good Progress towards PT goals: Progressing toward goals    Frequency    Min 4X/week      PT Plan Current plan remains appropriate    Co-evaluation PT/OT/SLP Co-Evaluation/Treatment: Yes Reason for Co-Treatment: Complexity of the patient's impairments (multi-system involvement);Necessary to address cognition/behavior during functional activity;For patient/therapist safety;To address functional/ADL transfers PT goals addressed during session: Mobility/safety with mobility;Balance OT goals addressed during session: ADL's and self-care      AM-PAC PT "6 Clicks" Mobility   Outcome Measure  Help needed turning from your back to your side while in a flat bed without using bedrails?: A Lot Help needed moving from lying on your back to sitting on the side of a flat bed without using bedrails?: A Little Help needed moving to and from a bed to a chair (including a wheelchair)?: A Lot Help needed standing up from a chair using your arms (e.g., wheelchair or bedside chair)?: A Lot Help needed to walk in hospital room?: Total Help needed climbing 3-5 steps with a railing? : Total 6 Click Score: 11    End of Session Equipment Utilized During Treatment: Gait belt Activity Tolerance: Patient limited by  fatigue Patient left: with call bell/phone within reach;in chair;with chair alarm set Nurse Communication: Mobility status;Patient requests pain meds PT Visit Diagnosis: Other symptoms and signs involving the nervous system (R29.898);Hemiplegia and hemiparesis Hemiplegia - Right/Left: Left Hemiplegia - dominant/non-dominant: Non-dominant Hemiplegia - caused by: Cerebral infarction     Time: 9379-0240 PT Time Calculation (min) (ACUTE ONLY): 32 min  Charges:  $Therapeutic Activity: 8-22 mins                      [name] Good, PT Pager [phone]    Zena Amos 10/04/2019, 2:11 PM

## 2019-10-04 NOTE — Consult Note (Signed)
Ref: Kaleen Mask, MD   Subjective:  Awake. Somewhat quiet.  Improved hypokalemia. Tolerating Apixaban. T max 99.6 degree F.  Objective:  Vital Signs in the last 24 hours: Temp:  [98.1 F (36.7 C)-99.6 F (37.6 C)] 98.1 F (36.7 C) (06/09 0817) Pulse Rate:  [77-97] 97 (06/09 0817) Cardiac Rhythm: Normal sinus rhythm (06/09 0730) Resp:  [15-19] 18 (06/09 0817) BP: (108-161)/(57-89) 134/79 (06/09 0817) SpO2:  [96 %-100 %] 100 % (06/09 0817)  Physical Exam: BP Readings from Last 1 Encounters:  10/04/19 134/79     Wt Readings from Last 1 Encounters:  10/02/19 105.8 kg    Weight change:  Body mass index is 44.07 kg/m. HEENT: [place]/AT, Eyes-Light Brown, Conjunctiva-Pink, Sclera-Non-icteric Neck: No JVD, No bruit, Trachea midline. Lungs:  Clear, Bilateral. Cardiac:  Regular rhythm, normal S1 and S2, no S3. III/VI systolic murmur. Abdomen:  Soft, non-tender. BS present. Extremities:  No edema present. No cyanosis. No clubbing. CNS: AxOx2, Cranial nerves grossly intact, moves all 4 extremities. Left sided weakness continues. Skin: Warm and dry.   Intake/Output from previous day: 06/08 0701 - 06/09 0700 In: 240 [P.O.:240] Out: -     Lab Results: BMET    Component Value Date/Time   NA 138 10/04/2019 0804   NA 140 10/02/2019 0630   NA 140 10/01/2019 2105   K 4.1 10/04/2019 0804   K 3.5 10/02/2019 0630   K 2.3 (LL) 10/01/2019 2105   CL 105 10/04/2019 0804   CL 107 10/02/2019 0630   CL 102 10/01/2019 2105   CO2 24 10/04/2019 0804   CO2 23 10/02/2019 0630   CO2 22 10/01/2019 2056   GLUCOSE 114 (H) 10/04/2019 0804   GLUCOSE 107 (H) 10/02/2019 0630   GLUCOSE 101 (H) 10/01/2019 2105   BUN 8 10/04/2019 0804   BUN 8 10/02/2019 0630   BUN 15 10/01/2019 2105   CREATININE 0.61 10/04/2019 0804   CREATININE 0.71 10/02/2019 0630   CREATININE 0.80 10/01/2019 2105   CALCIUM 9.0 10/04/2019 0804   CALCIUM 8.5 (L) 10/02/2019 0630   CALCIUM 8.9 10/01/2019 2056   GFRNONAA >60 10/04/2019 0804   GFRNONAA >60 10/02/2019 0630   GFRNONAA 52 (L) 10/01/2019 2056   GFRAA >60 10/04/2019 0804   GFRAA >60 10/02/2019 0630   GFRAA >60 10/01/2019 2056   CBC    Component Value Date/Time   WBC 7.3 10/01/2019 2056   RBC 4.45 10/01/2019 2056   HGB 13.6 10/01/2019 2105   HCT 40.0 10/01/2019 2105   PLT 158 10/01/2019 2056   MCV 91.5 10/01/2019 2056   MCH 31.5 10/01/2019 2056   MCHC 34.4 10/01/2019 2056   RDW 12.4 10/01/2019 2056   LYMPHSABS 1.5 10/01/2019 2056   MONOABS 0.7 10/01/2019 2056   EOSABS 0.2 10/01/2019 2056   BASOSABS 0.1 10/01/2019 2056   HEPATIC Function Panel Recent Labs    10/01/19 2056  PROT 6.1*   HEMOGLOBIN A1C No components found for: HGA1C,  MPG CARDIAC ENZYMES Lab Results  Component Value Date   CKTOTAL 192 (H) 07/17/2016   BNP No results for input(s): PROBNP in the last 8760 hours. TSH No results for input(s): TSH in the last 8760 hours. CHOLESTEROL Recent Labs    10/02/19 0630  CHOL 176    Scheduled Meds: .  stroke: mapping our early stages of recovery book   Does not apply Once  . apixaban  10 mg Oral BID   Followed by  . [START ON 10/10/2019] apixaban  5  mg Oral BID  . atorvastatin  80 mg Oral Daily  . feeding supplement  1 Container Oral TID BM  . multivitamin with minerals  1 tablet Oral Daily  . potassium chloride  20 mEq Oral BID   Continuous Infusions: PRN Meds:.acetaminophen **OR** acetaminophen (TYLENOL) oral liquid 160 mg/5 mL **OR** acetaminophen, methocarbamol, rOPINIRole  Assessment/Plan: LV apical thrombus S/P acute ischemic right thalamic infarct Old stroke CAD GERD Depression Morbid Obesity  Continue high dose Apixaban x 1 week then 5 mg. Bid for 3-6 months. Awaiting rehab admission.    LOS: 3 days   Time spent including chart review, lab review, examination, discussion with patient and hospitalist : 30 min   Dixie Dials  MD  10/04/2019, 9:39 AM

## 2019-10-04 NOTE — Evaluation (Signed)
Speech Language Pathology Evaluation Patient Details Name: [name] Good MRN: [id number] DOB: Sep 15, 1950 Today's Date: 10/04/2019 Time: 1601-0932 SLP Time Calculation (min) (ACUTE ONLY): 24 min  Problem List:  Patient Active Problem List   Diagnosis Date Noted  . Severe dehydration 10/02/2019  . Grief reaction 10/02/2019  . RLS (restless legs syndrome) 10/02/2019  . Acute CVA (cerebrovascular accident) ([place]) 10/01/2019  . Hypokalemia 10/01/2019  . Cellulitis of left upper extremity 10/18/2017  . Arthritis 10/18/2017  . Septic shock ([place]) 10/16/2017  . Former smoker 11/13/2016  . History of restless legs syndrome 11/13/2016  . Nontraumatic incomplete tear of right rotator cuff 08/18/2016  . Autoimmune disease ([place]) 08/12/2016  . Primary osteoarthritis of both knees 08/12/2016  . Primary osteoarthritis of both feet 08/12/2016  . Obesity (BMI 35.0-39.9 without comorbidity) 08/12/2016  . Osteoarthritis of right hip 10/25/2015  . 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 S/P lumbar laminectomy 06/14/2015   Past Medical History:  Past Medical History:  Diagnosis Date  . Arthritis   . Depression   . GERD (gastroesophageal reflux disease)   . Myocardial infarction ([place]) 89   no visits to cardiac dr(thomas kelly) since 89  . Wound dehiscence    lumbar   Past Surgical History:  Past Surgical History:  Procedure Laterality Date  . APPENDECTOMY     17 yrs  . BREAST SURGERY Left    cyst  . CARDIAC CATHETERIZATION  89  . CHOLECYSTECTOMY     69 yrs old  . LUMBAR LAMINECTOMY/DECOMPRESSION MICRODISCECTOMY N/A 06/14/2015   Procedure: Lumbar Three-Four,Lumbar Four-Five, Lumbar Five-Sacral One Laminectomy;  Surgeon: Eustace Moore, MD;  Location: [facility];  Service: Neurosurgery;  Laterality: N/A;  . LUMBAR WOUND DEBRIDEMENT N/A 07/24/2015   Procedure: lumbar wound revision;  Surgeon: Eustace Moore, MD;  Location: [facility];  Service: Neurosurgery;  Laterality: N/A;   . TOTAL HIP ARTHROPLASTY Right 10/25/2015   Procedure: RIGHT TOTAL HIP ARTHROPLASTY ANTERIOR APPROACH;  Surgeon: Mcarthur Rossetti, MD;  Location: [facility];  Service: Orthopedics;  Laterality: Right;  . TUBAL LIGATION     HPI:  Pt is a 69 y/o female with PMH of CAD, depression, arthritis, RLS, MI, prior lumbar fusion, R THA, presenting for acute onset on L sided weakness and slurred speech. CT negative for acute infarct. 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  Pt also found with severe hypokalemia and dehydration; noted recent loss of her mother and is grieving.    Assessment / Plan / Recommendation Clinical Impression  Pt presents with confusion and decreased problem solving, tearful upon SLP arrival because she could not remember the password to her phone or how to use her callbell vs room phone. She acknowledges that she has not been "thinking clearly" and shows additional emergent awareness of some of her acute physical changes, but she is not able to adapt and problem solve around them. Her sustained attention is impaired, also impacting her ability to store and remember information. Pt's daughter arrived at the end of this evaluation and shared that pt would not normally have these difficulties PTA. Recommend SLP f/u to maximize functional independence.     SLP Assessment  SLP Recommendation/Assessment: Patient needs continued Speech Lanaguage Pathology Services SLP Visit Diagnosis: Cognitive communication deficit (R41.841)    Follow Up Recommendations  Inpatient Rehab    Frequency and Duration min 2x/week  2 weeks      SLP Evaluation Cognition  Overall Cognitive Status: Impaired/Different from baseline Arousal/Alertness: Awake/alert Orientation Level: Oriented X4 Attention: Sustained Sustained Attention: Impaired Sustained Attention Impairment: Verbal basic;Functional basic Memory: Impaired Memory Impairment: Storage  deficit;Decreased recall of new information Problem Solving: Impaired Problem Solving Impairment: Verbal basic;Functional basic Safety/Judgment: Impaired       Comprehension  Auditory Comprehension Overall Auditory Comprehension: Appears within functional limits for tasks assessed    Expression Expression Primary Mode of Expression: Verbal Verbal Expression Overall Verbal Expression: Appears within functional limits for tasks assessed   Oral / Motor  Motor Speech Overall Motor Speech: Impaired Respiration: Within functional limits Phonation: Normal Resonance: Within functional limits Articulation: Impaired Level of Impairment: Conversation Intelligibility: Intelligible Motor Planning: Not tested   GO                     Mahala Menghini., M.A. CCC-SLP Acute Rehabilitation Services Pager [phone] Office [phone]  10/04/2019, 12:15 PM

## 2019-10-04 NOTE — Discharge Instructions (Signed)
Information on my medicine - ELIQUIS (apixaban)  This medication education was reviewed with me or my healthcare representative as part of my discharge preparation.  The pharmacist that spoke with me during my hospital stay was:  Arvilla Meres, [facility]  Why was Eliquis prescribed for you? Eliquis was prescribed to treat blood clots that may have been found in the veins of your legs (deep vein thrombosis) or in your lungs (pulmonary embolism) and to reduce the risk of them occurring again.  What do You need to know about Eliquis ? The starting dose is 10 mg (two 5 mg tablets) taken TWICE daily for the FIRST SEVEN (7) DAYS, then on 10/10/19  the dose is reduced to ONE 5 mg tablet taken TWICE daily.  Eliquis may be taken with or without food.   Try to take the dose about the same time in the morning and in the evening. If you have difficulty swallowing the tablet whole please discuss with your pharmacist how to take the medication safely.  Take Eliquis exactly as prescribed and DO NOT stop taking Eliquis without talking to the doctor who prescribed the medication.  Stopping may increase your risk of developing a new blood clot.  Refill your prescription before you run out.  After discharge, you should have regular check-up appointments with your healthcare provider that is prescribing your Eliquis.    What do you do if you miss a dose? If a dose of ELIQUIS is not taken at the scheduled time, take it as soon as possible on the same day and twice-daily administration should be resumed. The dose should not be doubled to make up for a missed dose.  Important Safety Information A possible side effect of Eliquis is bleeding. You should call your healthcare provider right away if you experience any of the following: ? Bleeding from an injury or your nose that does not stop. ? Unusual colored urine (red or dark brown) or unusual colored stools (red or black). ? Unusual bruising for unknown  reasons. ? A serious fall or if you hit your head (even if there is no bleeding).  Some medicines may interact with Eliquis and might increase your risk of bleeding or clotting while on Eliquis. To help avoid this, consult your healthcare provider or pharmacist prior to using any new prescription or non-prescription medications, including herbals, vitamins, non-steroidal anti-inflammatory drugs (NSAIDs) and supplements.  This website has more information on Eliquis (apixaban): http://www.eliquis.com/eliquis/home

## 2019-10-04 NOTE — Progress Notes (Addendum)
Occupational Therapy Treatment Patient Details Name: [name] Good MRN: [id number] DOB: 07/02/1950 Today's Date: 10/04/2019    History of present illness Pt is a 69 y/o female with PMH of CAD, depression, arthritis, RLS, MI, prior lumbar fusion, R THA, presenting for acute onset on L sided weakness and slurred speech. CT negative for acute infarct. 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  Pt also found with severe hypokalemia and dehydration; noted recent loss of her mother and is grieving.    OT comments  Patient supine in bed and agreeable to OT/PT session. Pt reports increased confusion this morning, voicing "I didn't know where I was", but now oriented.  Patient continues to follow 1 step commands with increased time, but difficulty with multiple step commands, sequencing, problem solving and awareness.  Continues to demonstrate sensory motor deficits to L UE, but more controlled than yesterday; able to engage with ADL tasks with assist using L hand: mod assist for UB dressing and grooming.  Continue to assess vision, reaching out to L visual field towards objects that are not there, requires cueing to remain eyes open.  Hypersensitive on L side during session. She complains of back and B feet pain, assisted with mod assist +2 to recliner and noted significant increase in patient comfort. BP in recliner 139/68 with pt reports mild nausea and dizziness but fades with sustained sitting. Continue per POC, CIR remains appropriate.    Follow Up Recommendations  CIR    Equipment Recommendations  3 in 1 bedside commode    Recommendations for Other Services Rehab consult    Precautions / Restrictions Precautions Precautions: Fall Restrictions Weight Bearing Restrictions: No       Mobility Bed Mobility Overal bed mobility: Needs Assistance Bed Mobility: Supine to Sit     Supine to sit: Min assist     General bed mobility comments: min assist  for safety/balance, scooting foward; cueing for sequencing and technique using bed rails with increased time required  Transfers Overall transfer level: Needs assistance Equipment used: 2 person hand held assist Transfers: Sit to/from [employer] Transfers Sit to Stand: Mod assist;+2 physical assistance;+2 safety/equipment Stand pivot transfers: Mod assist;+2 physical assistance;+2 safety/equipment       General transfer comment: mod assist +2 to power up and steady with cueing for positioning, technique, forward lean; pivoting towards R side with step by step cueing and tactile support to initate movement of R LE     Balance Overall balance assessment: Needs assistance Sitting-balance support: No upper extremity supported;Feet supported Sitting balance-Leahy Scale: Fair Sitting balance - Comments: min guard statically for safety, slight R Lateral lean but much improved midline positioniing today  Postural control: Right lateral lean Standing balance support: Bilateral upper extremity supported;During functional activity Standing balance-Leahy Scale: Poor Standing balance comment: relies on BUE and external support, continues to have posterior lean but able to maintain with support                           ADL either performed or assessed with clinical judgement   ADL Overall ADL's : Needs assistance/impaired     Grooming: Wash/dry hands;Wash/dry face;Moderate assistance;Sitting Grooming Details (indicate cue type and reason): able to wash face with min cueing for L side of face, max assist to wash hands          Upper Body Dressing : Moderate assistance;Sitting Upper Body Dressing Details (indicate cue type and  reason): to don new gown, poor awarness and control of L UE; seqencing of task  Lower Body Dressing: Total assistance;+2 for physical assistance;+2 for safety/equipment Lower Body Dressing Details (indicate cue type and reason): to don socks, sit to  stand with mod assist +2 Toilet Transfer: Moderate assistance;+2 for safety/equipment;+2 for physical assistance Toilet Transfer Details (indicate cue type and reason): simulated to recliner          Functional mobility during ADLs: +2 for physical assistance;+2 for safety/equipment;Moderate assistance General ADL Comments: pt limited by cognition, L sided deficits  (sesnation, coordination, motor planning), balance       Vision   Additional Comments: continue assessment, continues to reach for items on L side that aren't there, question upper quadrant deficits as well as patient hesitant to look up; requires cuenig to keep eyes open   Perception     Praxis      Cognition Arousal/Alertness: Awake/alert(becomes lethargic during session) Behavior During Therapy: Anxious;Impulsive;Restless Overall Cognitive Status: Impaired/Different from baseline Area of Impairment: Attention;Following commands;Safety/judgement;Awareness;Problem solving                   Current Attention Level: Focused   Following Commands: Follows one step commands consistently;Follows one step commands with increased time;Follows multi-step commands inconsistently Safety/Judgement: Decreased awareness of safety;Decreased awareness of deficits Awareness: Intellectual Problem Solving: Difficulty sequencing;Requires verbal cues;Requires tactile cues;Decreased initiation General Comments: patient reports increased confusion today voicing "I didn't know where I was this morning" but pt oriented during session, patient requires 1 step cueing, constant redirection to task as perseverating on "tightness" of gown on L side         Exercises     Shoulder Instructions       General Comments BP seated in recliner 139/68; pt reports mild dizziness and nausea at completion of session but fades; fatigues easily today    Pertinent Vitals/ Pain       Pain Assessment: Faces Faces Pain Scale: Hurts little  more Pain Location: B feet  Pain Descriptors / Indicators: Discomfort Pain Intervention(s): Limited activity within patient's tolerance;Monitored during session;Repositioned;Patient requesting pain meds-RN notified  Home Living     Available Help at Discharge: Family Type of Home: Mobile home                                  Prior Functioning/Environment              Frequency  Min 3X/week        Progress Toward Goals  OT Goals(current goals can now be found in the care plan section)  Progress towards OT goals: Progressing toward goals  Acute Rehab OT Goals Patient Stated Goal: to go home with [facility] therapies OT Goal Formulation: With patient  Plan Discharge plan remains appropriate;Frequency remains appropriate    Co-evaluation    PT/OT/SLP Co-Evaluation/Treatment: Yes Reason for Co-Treatment: Necessary to address cognition/behavior during functional activity;For patient/therapist safety;To address functional/ADL transfers   OT goals addressed during session: ADL's and self-care      AM-PAC OT "6 Clicks" Daily Activity     Outcome Measure   Help from another person eating meals?: A Lot Help from another person taking care of personal grooming?: A Lot Help from another person toileting, which includes using toliet, bedpan, or urinal?: Total Help from another person bathing (including washing, rinsing, drying)?: A Lot Help from another person to put on and taking off regular  upper body clothing?: A Lot Help from another person to put on and taking off regular lower body clothing?: Total 6 Click Score: 10    End of Session Equipment Utilized During Treatment: Gait belt  OT Visit Diagnosis: Other abnormalities of gait and mobility (R26.89);Other symptoms and signs involving cognitive function;Other symptoms and signs involving the nervous system (R29.898);Muscle weakness (generalized) (M62.81);Low vision, both eyes (H54.2)   Activity Tolerance  Patient tolerated treatment well   Patient Left in chair;with call bell/phone within reach;with chair alarm set   Nurse Communication Mobility status;Precautions        Time: 5597-4163 OT Time Calculation (min): 33 min  Charges: OT General Charges $OT Visit: 1 Visit OT Treatments $Self Care/Home Management : 8-22 mins  Jolaine Artist, OT [place] Pager [phone] Office [place] 10/04/2019, 1:31 PM

## 2019-10-05 ENCOUNTER — Encounter (HOSPITAL_COMMUNITY): Payer: Self-pay | Admitting: Physical Medicine & Rehabilitation

## 2019-10-05 ENCOUNTER — Other Ambulatory Visit: Payer: Self-pay

## 2019-10-05 ENCOUNTER — Inpatient Hospital Stay (HOSPITAL_COMMUNITY)
Admission: RE | Admit: 2019-10-05 | Discharge: 2019-10-21 | DRG: 057 | Disposition: A | Discharge status: Home Health | Payer: Medicare Other | Source: Intra-hospital | Attending: Physical Medicine & Rehabilitation | Admitting: Physical Medicine & Rehabilitation

## 2019-10-05 DIAGNOSIS — Z880 Allergy status to penicillin: Secondary | ICD-10-CM | POA: Diagnosis not present

## 2019-10-05 DIAGNOSIS — Z87891 Personal history of nicotine dependence: Secondary | ICD-10-CM | POA: Diagnosis not present

## 2019-10-05 DIAGNOSIS — M1990 Unspecified osteoarthritis, unspecified site: Secondary | ICD-10-CM | POA: Diagnosis present

## 2019-10-05 DIAGNOSIS — I2510 Atherosclerotic heart disease of native coronary artery without angina pectoris: Secondary | ICD-10-CM | POA: Diagnosis present

## 2019-10-05 DIAGNOSIS — Z96641 Presence of right artificial hip joint: Secondary | ICD-10-CM | POA: Diagnosis present

## 2019-10-05 DIAGNOSIS — I69354 Hemiplegia and hemiparesis following cerebral infarction affecting left non-dominant side: Principal | ICD-10-CM

## 2019-10-05 DIAGNOSIS — Z7901 Long term (current) use of anticoagulants: Secondary | ICD-10-CM | POA: Diagnosis not present

## 2019-10-05 DIAGNOSIS — E860 Dehydration: Secondary | ICD-10-CM | POA: Diagnosis present

## 2019-10-05 DIAGNOSIS — Z981 Arthrodesis status: Secondary | ICD-10-CM | POA: Diagnosis not present

## 2019-10-05 DIAGNOSIS — I69398 Other sequelae of cerebral infarction: Secondary | ICD-10-CM

## 2019-10-05 DIAGNOSIS — I639 Cerebral infarction, unspecified: Secondary | ICD-10-CM | POA: Diagnosis present

## 2019-10-05 DIAGNOSIS — K5900 Constipation, unspecified: Secondary | ICD-10-CM | POA: Diagnosis not present

## 2019-10-05 DIAGNOSIS — E876 Hypokalemia: Secondary | ICD-10-CM | POA: Diagnosis present

## 2019-10-05 DIAGNOSIS — R7303 Prediabetes: Secondary | ICD-10-CM | POA: Diagnosis present

## 2019-10-05 DIAGNOSIS — H539 Unspecified visual disturbance: Secondary | ICD-10-CM | POA: Diagnosis present

## 2019-10-05 DIAGNOSIS — K219 Gastro-esophageal reflux disease without esophagitis: Secondary | ICD-10-CM | POA: Diagnosis present

## 2019-10-05 DIAGNOSIS — I6381 Other cerebral infarction due to occlusion or stenosis of small artery: Secondary | ICD-10-CM

## 2019-10-05 DIAGNOSIS — I252 Old myocardial infarction: Secondary | ICD-10-CM | POA: Diagnosis not present

## 2019-10-05 DIAGNOSIS — F419 Anxiety disorder, unspecified: Secondary | ICD-10-CM | POA: Diagnosis not present

## 2019-10-05 DIAGNOSIS — Z79899 Other long term (current) drug therapy: Secondary | ICD-10-CM

## 2019-10-05 DIAGNOSIS — G2581 Restless legs syndrome: Secondary | ICD-10-CM | POA: Diagnosis present

## 2019-10-05 DIAGNOSIS — E785 Hyperlipidemia, unspecified: Secondary | ICD-10-CM | POA: Diagnosis present

## 2019-10-05 DIAGNOSIS — M792 Neuralgia and neuritis, unspecified: Secondary | ICD-10-CM | POA: Diagnosis not present

## 2019-10-05 HISTORY — DX: Cerebral infarction, unspecified: I63.9

## 2019-10-05 HISTORY — DX: Anxiety disorder, unspecified: F41.9

## 2019-10-05 MED ORDER — APIXABAN 5 MG PO TABS: 10.0000 mg | ORAL_TABLET | Freq: Two times a day (BID) | ORAL | 0 refills | Status: DC

## 2019-10-05 MED ORDER — APIXABAN 5 MG PO TABS
10.0000 mg | ORAL_TABLET | Freq: Two times a day (BID) | ORAL | Status: AC
  Administered 2019-10-05 – 2019-10-09 (×9): 10 mg via ORAL
  Filled 2019-10-05 (×4): qty 2
  Filled 2019-10-05: qty 4
  Filled 2019-10-05 (×3): qty 2
  Filled 2019-10-05: qty 4
  Filled 2019-10-05: qty 2

## 2019-10-05 MED ORDER — OXYCODONE-ACETAMINOPHEN 5-325 MG PO TABS
1.0000 | ORAL_TABLET | Freq: Four times a day (QID) | ORAL | Status: DC | PRN
  Administered 2019-10-05 – 2019-10-21 (×44): 1 via ORAL
  Filled 2019-10-05 (×48): qty 1

## 2019-10-05 MED ORDER — ATORVASTATIN CALCIUM 80 MG PO TABS: 80.0000 mg | ORAL_TABLET | Freq: Every day | ORAL | Status: DC

## 2019-10-05 MED ORDER — ACETAMINOPHEN 160 MG/5ML PO SOLN: 650.0000 mg | ORAL | Status: DC | PRN

## 2019-10-05 MED ORDER — METHOCARBAMOL 500 MG PO TABS
500.0000 mg | ORAL_TABLET | Freq: Three times a day (TID) | ORAL | Status: AC | PRN
  Administered 2019-10-05 – 2019-10-06 (×2): 500 mg via ORAL
  Filled 2019-10-05 (×2): qty 1

## 2019-10-05 MED ORDER — ATORVASTATIN CALCIUM 80 MG PO TABS
80.0000 mg | ORAL_TABLET | Freq: Every day | ORAL | Status: DC
  Administered 2019-10-06 – 2019-10-21 (×16): 80 mg via ORAL
  Filled 2019-10-05 (×16): qty 1

## 2019-10-05 MED ORDER — MAGNESIUM OXIDE 400 MG PO TABS: 400.0000 mg | ORAL_TABLET | Freq: Every day | ORAL | Status: DC

## 2019-10-05 MED ORDER — ROPINIROLE HCL 1 MG PO TABS
3.0000 mg | ORAL_TABLET | Freq: Every evening | ORAL | Status: DC | PRN
  Administered 2019-10-05 – 2019-10-07 (×3): 3 mg via ORAL
  Filled 2019-10-05 (×3): qty 3

## 2019-10-05 MED ORDER — ACETAMINOPHEN 650 MG RE SUPP: 650.0000 mg | RECTAL | Status: DC | PRN

## 2019-10-05 MED ORDER — ADULT MULTIVITAMIN W/MINERALS CH
1.0000 | ORAL_TABLET | Freq: Every day | ORAL | Status: DC
  Administered 2019-10-06 – 2019-10-21 (×16): 1 via ORAL
  Filled 2019-10-05 (×17): qty 1

## 2019-10-05 MED ORDER — POTASSIUM CHLORIDE CRYS ER 20 MEQ PO TBCR
20.0000 meq | EXTENDED_RELEASE_TABLET | Freq: Two times a day (BID) | ORAL | Status: DC
  Administered 2019-10-05 – 2019-10-21 (×32): 20 meq via ORAL
  Filled 2019-10-05 (×33): qty 1

## 2019-10-05 MED ORDER — ACETAMINOPHEN 325 MG PO TABS
650.0000 mg | ORAL_TABLET | ORAL | Status: DC | PRN
  Administered 2019-10-06 – 2019-10-21 (×15): 650 mg via ORAL
  Filled 2019-10-05 (×16): qty 2

## 2019-10-05 MED ORDER — ADULT MULTIVITAMIN W/MINERALS CH: 1.0000 | ORAL_TABLET | Freq: Every day | ORAL | Status: DC

## 2019-10-05 MED ORDER — MELATONIN 3 MG PO TABS
3.0000 mg | ORAL_TABLET | Freq: Every day | ORAL | Status: DC
  Administered 2019-10-05 – 2019-10-19 (×15): 3 mg via ORAL
  Filled 2019-10-05 (×16): qty 1

## 2019-10-05 MED ORDER — MELATONIN 3 MG PO TABS: 3.0000 mg | ORAL_TABLET | Freq: Every day | ORAL | 0 refills | Status: DC

## 2019-10-05 MED ORDER — APIXABAN 5 MG PO TABS
5.0000 mg | ORAL_TABLET | Freq: Two times a day (BID) | ORAL | Status: DC
  Administered 2019-10-10 – 2019-10-21 (×23): 5 mg via ORAL
  Filled 2019-10-05 (×23): qty 1

## 2019-10-05 MED ORDER — SORBITOL 70 % SOLN
30.0000 mL | Freq: Every day | Status: DC | PRN
  Administered 2019-10-08 – 2019-10-09 (×2): 30 mL via ORAL
  Filled 2019-10-05 (×2): qty 30

## 2019-10-05 MED ORDER — APIXABAN 5 MG PO TABS: 5.0000 mg | ORAL_TABLET | Freq: Two times a day (BID) | ORAL | Status: DC

## 2019-10-05 MED ORDER — BOOST / RESOURCE BREEZE PO LIQD CUSTOM
1.0000 | Freq: Three times a day (TID) | ORAL | Status: DC
  Administered 2019-10-06 (×3): 1 via ORAL

## 2019-10-05 MED ORDER — POTASSIUM CHLORIDE CRYS ER 20 MEQ PO TBCR: 20.0000 meq | EXTENDED_RELEASE_TABLET | Freq: Every day | ORAL | 0 refills | Status: DC

## 2019-10-05 NOTE — Progress Notes (Signed)
Inpatient Rehabilitation Admissions Coordinator  Insurance has approved for CIR admit today. I met with patient at bedside and spoke with her daughter, Elloise by phone. They are in agreement to admit. I have notified acute team, Dr. Sloan Leiter and [facility] team. I will make the arrangements  Danne Baxter, RN, MSN Rehab Admissions Coordinator [phone] 10/05/2019 10:52 AM

## 2019-10-05 NOTE — Progress Notes (Signed)
Genice Rouge, MD  Physician  Physical Medicine and Rehabilitation  Consult Note     Signed  Date of Service:  10/03/2019  1:46 PM      Related encounter: ED to Hosp-Admission (Discharged) from 10/01/2019 in [place] [facility]      Signed      Expand All Collapse All  Show:Clear all [x] Manual[x] Template[] Copied  Added by: [x] Angiulli, Mcarthur Rossetti, PA-C[x] Genice Rouge, MD  [] Hover for details          Physical Medicine and Rehabilitation Consult Reason for Consult: Left-sided weakness with slurred speech Referring Physician: [facility]     HPI: [name] Good is a 69 y.o. right-handed female with history of depression, CAD, lumbar spine fusion, RLS, quit smoking 6 years ago.  Per chart review patient lives with spouse independent with assistive device.  Mobile home with 3 steps to entry.  Presented 10/01/2019 with left-sided weakness and slurred speech.  Cranial CT scan negative for acute changes.  Chronic right parieto-occipital infarction.  CT angiogram of head and neck negative for emergent large vessel occlusion.  MRI of the brain showed a 13 mm acute ischemic nonhemorrhagic right thalamic infarction.  Echocardiogram with ejection fraction of 65% grade 1 diastolic dysfunction as well as LV thrombus.  Admission chemistries with potassium 2.3, creatinine 1.08.  Initially maintained on aspirin and Plavix for CVA prophylaxis and changed to Eliquis after findings of LV thrombus- Cardiologist came to speak to pt while doing consult for admission.  Therapy evaluations completed with recommendations of physical medicine rehab consult.   Pt reports she's a little constipated- LBM was Saturday- didn't eat much, but was hard to TRY and go Sunday in ER.   Has been dealing with grief due to her mother dying recently- and found to have dehydration AND hypokalemia of 2.3 upon admission.  Pt c/o aching pain on B/L feet- on bottom     Review of Systems  Constitutional: Negative for chills  and fever.  HENT: Negative for hearing loss.   Eyes: Negative for blurred vision and double vision.  Respiratory: Negative for cough and shortness of breath.   Cardiovascular: Negative for chest pain, palpitations and leg swelling.  Gastrointestinal: Positive for constipation. Negative for heartburn, nausea and vomiting.       GERD  Genitourinary: Negative for dysuria, flank pain and hematuria.  Musculoskeletal: Positive for back pain, joint pain and myalgias.  Skin: Negative for rash.  Psychiatric/Behavioral: Positive for depression. The patient has insomnia.   All other systems reviewed and are negative.       Past Medical History:  Diagnosis Date  . Arthritis    . Depression    . GERD (gastroesophageal reflux disease)    . Myocardial infarction (HCC) 89    no visits to cardiac dr(thomas kelly) since 89  . Wound dehiscence      lumbar         Past Surgical History:  Procedure Laterality Date  . APPENDECTOMY        17 yrs  . BREAST SURGERY Left      cyst  . CARDIAC CATHETERIZATION   89  . CHOLECYSTECTOMY        69  yrs old  . LUMBAR LAMINECTOMY/DECOMPRESSION MICRODISCECTOMY N/A 06/14/2015    Procedure: Lumbar Three-Four,Lumbar Four-Five, Lumbar Five-Sacral One Laminectomy;  Surgeon: Tia Alert, MD;  Location: [facility] NEURO ORS;  Service: Neurosurgery;  Laterality: N/A;  . LUMBAR WOUND DEBRIDEMENT N/A 07/24/2015    Procedure: lumbar wound revision;  Surgeon: Tia Alert, MD;  Location: [facility] NEURO ORS;  Service: Neurosurgery;  Laterality: N/A;  . TOTAL HIP ARTHROPLASTY Right 10/25/2015    Procedure: RIGHT TOTAL HIP ARTHROPLASTY ANTERIOR APPROACH;  Surgeon: Kathryne Hitch, MD;  Location: [facility] ORS;  Service: Orthopedics;  Laterality: Right;  . TUBAL LIGATION        History reviewed. No pertinent family history. Social History:  reports that she quit smoking about 6 years ago. Her smoking use included cigarettes. She has a 80.00 pack-year smoking history. She has never used  smokeless tobacco. She reports current alcohol use. She reports that she does not use drugs. Allergies:       Allergies  Allergen Reactions  . Amoxicillin Other (See Comments)      Tolerated Zosyn Has patient had a PCN reaction causing immediate rash, facial/tongue/throat swelling, SOB or lightheadedness with hypotension: No Has patient had a PCN reaction causing severe rash involving mucus membranes or skin necrosis: No Has patient had a PCN reaction that required hospitalization No Has patient had a PCN reaction occurring within the last 10 years: No If all of the above answers are "NO", then may proceed with Cephalosporin use.          Medications Prior to Admission  Medication Sig Dispense Refill  . calcium carbonate (TUMS EX) 750 MG chewable tablet Chew 1 tablet by mouth 2 (two) times daily as needed for heartburn.      . fluticasone (FLONASE) 50 MCG/ACT nasal spray Place 2 sprays into both nostrils daily as needed.      Marland Kitchen ibuprofen (ADVIL) 200 MG tablet Take 400-600 mg by mouth every 6 (six) hours as needed.      Marland Kitchen oxyCODONE-acetaminophen (PERCOCET) 7.5-325 MG tablet Take 1 tablet by mouth 4 (four) times daily as needed for pain.      . phentermine (ADIPEX-P) 37.5 MG tablet Take 37.5 mg by mouth daily as needed (appetite).       . Pseudoephedrine HCl (SUDAFED PO) Take 1 tablet by mouth daily as needed (Allergies).      Marland Kitchen rOPINIRole (REQUIP) 3 MG tablet Take 3 mg by mouth at bedtime as needed (restless leg).       Marland Kitchen tetrahydrozoline 0.05 % ophthalmic solution Place 1 drop into both eyes daily as needed (Dry eyes).      Marland Kitchen aspirin 325 MG EC tablet Take 1 tablet (325 mg total) by mouth daily. (Patient not taking: Reported on 10/02/2019) 30 tablet 0  . doxycycline (VIBRAMYCIN) 100 MG capsule Take 1 capsule (100 mg total) by mouth 2 (two) times daily. (Patient not taking: Reported on 10/02/2019) 12 capsule 0  . methocarbamol (ROBAXIN) 500 MG tablet Take 1 tablet (500 mg total) by mouth every 6  (six) hours as needed for muscle spasms. (Patient not taking: Reported on 10/02/2019) 60 tablet 1  . methylPREDNISolone (MEDROL) 4 MG tablet Medrol dose pack. Take as instructed (Patient not taking: Reported on 10/02/2019) 21 tablet 0  . predniSONE (DELTASONE) 5 MG tablet Take 1 tablet (5 mg total) by mouth daily with breakfast. (Patient not taking: Reported on 10/02/2019) 30 tablet 1      Home: Home Living Family/patient expects to be discharged to:: Private residence Living Arrangements: Spouse/significant other Available Help at Discharge: Family(spouse works, granddaughter can help?) Type of Home: Mobile home Home Access: Stairs to enter Secretary/administrator of Steps: 3 Entrance Stairs-Rails: Left Home Layout: One level Bathroom Shower/Tub: Engineer, manufacturing systems: Handicapped height Bathroom Accessibility: Yes Home  Equipment: Gilmer Mor - single point, [employer] - 4 wheels, Other (comment)(toilet extension) Additional Comments: question accuracy, some info taken from 09/2017 record  Functional History: Prior Function Level of Independence: Independent with assistive device(s) Comments: per 09/2017 record she used cane or rollator when outside home/in community, pt reports driving and independent with ADLs Functional Status:  Mobility: Bed Mobility Overal bed mobility: Needs Assistance Bed Mobility: Rolling, Sidelying to Sit, Sit to Supine Rolling: Min guard(to her left; right NT) Sidelying to sit: Min assist Sit to supine: Min assist, +2 for safety/equipment General bed mobility comments: pt required assist to establish sitting EOB; return to bed she was clearly stuggling with spatial perception of where she and the bed are in space; ultimately needed assist to raise legs onto bed and 2nd person at head to protect from hitting against bed rails Transfers Overall transfer level: Needs assistance Equipment used: 2 person hand held assist Transfers: Sit to/from Stand Sit to Stand: Mod  assist, +2 physical assistance General transfer comment: however immediately leans posterior and requires +2 max assist to prevent posterior fall; with fatigue leaning towards her right and resisting weight shift over her RLE Ambulation/Gait General Gait Details: unable; in standing able to lift rt foot off the floor (with stable LLE) however could not begin to lift lt foot off floor due to inability to weight shift over RLE   ADL:   Cognition: Cognition Overall Cognitive Status: Impaired/Different from baseline Orientation Level: (P) Oriented to person, Oriented to place, Oriented to time Cognition Arousal/Alertness: Lethargic(initally, improved during session) Behavior During Therapy: Anxious, Impulsive, Restless Overall Cognitive Status: Impaired/Different from baseline Area of Impairment: Attention, Safety/judgement, Awareness, Problem solving, Following commands Current Attention Level: Sustained, Focused Following Commands: Follows one step commands consistently, Follows one step commands with increased time, Follows multi-step commands inconsistently Safety/Judgement: Decreased awareness of safety, Decreased awareness of deficits Awareness: Intellectual Problem Solving: Difficulty sequencing, Requires verbal cues, Requires tactile cues, Decreased initiation General Comments: patient tangled up in phone/call bell cords initally, then when untangled patient reaching into midair reporting "these cords are just everywhere", pt reports needing to talk to her daughter therefore handed her the phone and pt talking to daughter without phone on, assisted to call daughter and patinet voiced "hey they are in here. I'll call you back". ; pt in constant motion, poor awareness and control of L UE, no awareness of deficits or need for assist    Blood pressure (!) 124/57, pulse 90, temperature 98.6 F (37 C), temperature source Oral, resp. rate 18, height 5\' 1"  (1.549 m), weight 105.8 kg, SpO2 97  %. Physical Exam  Nursing note and vitals reviewed. Constitutional: She appears well-developed and well-nourished. No distress.  Pt slightly confused; flat affect; daughter and RN at bedside, appropriate otherwise, sitting up in bed, NAD  HENT:  Head: Normocephalic and atraumatic.  Nose: Nose normal.  Mouth/Throat: Oropharynx is clear and moist. No oropharyngeal exudate.  Absent sensation on L face/vs decreased  Eyes: Conjunctivae are normal. Right eye exhibits no discharge. Left eye exhibits no discharge. No scleral icterus.  EOMI but slow to left- R gaze preference- will only look left when specifically asked  Neck: No tracheal deviation present.  Cardiovascular:  Borderline tachycardia- regular rhythm-   Respiratory: No stridor.  CTA B/L- no W/R/R- good air movement  GI:  Soft, NT, (+)BS -hypoactive- nondistended  Musculoskeletal:     Cervical back: Normal range of motion and neck supple.     Comments: RUE- 5/5 in biceps,  triceps, WE, grip  LUE- 4/5 in same muscles RLE_ HF, KE, KF, DF and PF 5/5 LLE- 4/5 in same muscles   Neurological: She is alert.  Patient is a bit lethargic but arousable.  She displays a right gaze preference and left neglect.  She does provide her name.  Limited medical historian. Daughter at bedside- reports pt is "a little confused". Pt Ox2- to self and location, not date/day Absent sensation/abnormal sensation in LUE/LLE No clonus; no Hoffman's B/L  L neglect- wouldn't look or use L side unless asked specifically to LOOK at left hand- kept trying to reach with R hand  Skin: Skin is warm and dry.  IV LUE- forearm- looks good  Psychiatric:  Flat affect-       Lab Results Last 24 Hours  No results found for this or any previous visit (from the past 24 hour(s)).    Imaging Results (Last 48 hours)  CT Code Stroke CTA Head W/WO contrast   Result Date: 10/01/2019 CLINICAL DATA:  Initial evaluation for acute stroke, left-sided weakness, altered mental  status. EXAM: CT ANGIOGRAPHY HEAD AND NECK CT PERFUSION BRAIN TECHNIQUE: Multidetector CT imaging of the head and neck was performed using the standard protocol during bolus administration of intravenous contrast. Multiplanar CT image reconstructions and MIPs were obtained to evaluate the vascular anatomy. Carotid stenosis measurements (when applicable) are obtained utilizing NASCET criteria, using the distal internal carotid diameter as the denominator. Multiphase CT imaging of the brain was performed following IV bolus contrast injection. Subsequent parametric perfusion maps were calculated using RAPID software. CONTRAST:  OMNIPAQUE IOHEXOL 350 MG/ML SOLN COMPARISON:  Comparison made with prior noncontrast head CT from earlier same day. FINDINGS: CTA NECK FINDINGS Aortic arch: Visualized aortic arch of normal caliber with normal branch pattern. Moderate atheromatous plaque seen about the arch and origin of the great vessels without hemodynamically significant stenosis. Note made of soft plaque protruding into the lumen of the distal arch/proximal intrathoracic aorta (series 5, image 307). Visualized subclavian arteries widely patent. Right carotid system: Right common carotid artery patent from its origin to the bifurcation without flow-limiting stenosis. Scattered eccentric plaque about the right bifurcation without significant stenosis. Just distally within the proximal right ICA, additional focal plaque is seen with associated short-segment stenosis of up to 50% by NASCET criteria (series 5, image 169). Right ICA tortuous and medialized into the retropharyngeal space. Right ICA otherwise widely patent distally to the skull base without stenosis, dissection or occlusion. Left carotid system: Left common carotid artery patent from its origin to the bifurcation without stenosis. Scattered eccentric mixed plaque about the left bifurcation/proximal left ICA without hemodynamically significant stenosis.  Proximal left ICA tortuous and medialized into the retropharyngeal space. Left ICA widely patent distally to the skull base without stenosis, dissection or occlusion. Vertebral arteries: Both vertebral arteries arise from the subclavian arteries. Strongly dominant right vertebral artery with a markedly hypoplastic left vertebral artery noted. Dominant right vertebral artery patent within the neck without stenosis, dissection, or occlusion. Markedly diminutive left vertebral artery patent as well without appreciable stenosis or other vascular abnormality. Skeleton: Alignment: Straightening of the normal cervical lordosis. No listhesis or malalignment. Skull base and vertebrae: Skull base intact. Normal C1-2 articulations are preserved in the dens is intact. Vertebral body heights maintained. No acute fracture. Soft tissues and spinal canal: Soft tissues of the neck demonstrate no acute finding. No abnormal prevertebral edema. Spinal canal within normal limits. Disc levels: Mild multilevel cervical spondylosis without significant stenosis,  most pronounced at C6-7. Upper chest: Visualized upper chest demonstrates no acute finding. Partially visualized lung apices are clear. Other: None. Other neck: No other acute soft tissue abnormality within the neck. No mass lesion or adenopathy. Subcentimeter calcification noted within the left lobe of thyroid. Upper chest: Visualized upper chest demonstrates no acute finding. Partially visualized lungs are grossly clear. Review of the MIP images confirms the above findings CTA HEAD FINDINGS Anterior circulation: Petrous segments widely patent bilaterally. Scattered atheromatous plaque throughout the carotid siphons with relatively mild multifocal stenosis. A1 segments patent bilaterally. Normal anterior communicating artery complex. Anterior cerebral arteries widely patent proximally. Short-segment severe distal right ACA stenosis noted (series 5, image 62). ACAs otherwise widely  patent to their distal aspects. No M1 stenosis or occlusion. Normal MCA bifurcations. Distal MCA branches well perfused and symmetric. Posterior circulation: Dominant right vertebral artery patent to the vertebrobasilar junction without stenosis. Right PICA not visualized. Hypoplastic left vertebral artery patent to the vertebrobasilar junction as well. Left PICA patent. Basilar patent to its distal aspect without stenosis. Superior cerebral arteries patent bilaterally. Left PCA supplied via the basilar. Right PCA supplied via a hypoplastic right P1 segment and robust right posterior communicating artery. Both PCAs well perfused to their distal aspects without stenosis. Venous sinuses: Patent. Anatomic variants: Predominant fetal type origin of the right PCA. No intracranial aneurysm. Review of the MIP images confirms the above findings CT Brain Perfusion Findings: ASPECTS: 10 CBF (<30%) Volume: 0mL Perfusion (Tmax>6.0s) volume: 0mL Mismatch Volume: 0mL Infarction Location:Negative CT perfusion for acute core infarct or other perfusion deficit. IMPRESSION: CTA HEAD AND NECK IMPRESSION: 1. Negative CTA for emergent large vessel occlusion. 2. 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 3. Single short-segment severe distal right ACA stenosis as above. 4. Additional moderate atheromatous disease about the major arterial vasculature of the head and neck as above. No other hemodynamically significant or correctable stenosis. CT PERFUSION IMPRESSION: Negative CT perfusion. No evidence for acute core infarct or other perfusion deficit. CT CERVICAL SPINE IMPRESSION: No acute traumatic injury within the cervical spine. These results were communicated to Dr. Wilford Corner at approximately 9:00 pmon 6/6/2021by text page via the [facility] messaging system. Electronically Signed   By: Rise Mu M.D.   On: 10/01/2019 21:50    CT Code Stroke CTA Neck  W/WO contrast   Result Date: 10/01/2019 CLINICAL DATA:  Initial evaluation for acute stroke, left-sided weakness, altered mental status. EXAM: CT ANGIOGRAPHY HEAD AND NECK CT PERFUSION BRAIN TECHNIQUE: Multidetector CT imaging of the head and neck was performed using the standard protocol during bolus administration of intravenous contrast. Multiplanar CT image reconstructions and MIPs were obtained to evaluate the vascular anatomy. Carotid stenosis measurements (when applicable) are obtained utilizing NASCET criteria, using the distal internal carotid diameter as the denominator. Multiphase CT imaging of the brain was performed following IV bolus contrast injection. Subsequent parametric perfusion maps were calculated using RAPID software. CONTRAST:  OMNIPAQUE IOHEXOL 350 MG/ML SOLN COMPARISON:  Comparison made with prior noncontrast head CT from earlier same day. FINDINGS: CTA NECK FINDINGS Aortic arch: Visualized aortic arch of normal caliber with normal branch pattern. Moderate atheromatous plaque seen about the arch and origin of the great vessels without hemodynamically significant stenosis. Note made of soft plaque protruding into the lumen of the distal arch/proximal intrathoracic aorta (series 5, image 307). Visualized subclavian arteries widely patent. Right carotid system: Right common carotid artery patent from its  origin to the bifurcation without flow-limiting stenosis. Scattered eccentric plaque about the right bifurcation without significant stenosis. Just distally within the proximal right ICA, additional focal plaque is seen with associated short-segment stenosis of up to 50% by NASCET criteria (series 5, image 169). Right ICA tortuous and medialized into the retropharyngeal space. Right ICA otherwise widely patent distally to the skull base without stenosis, dissection or occlusion. Left carotid system: Left common carotid artery patent from its origin to the bifurcation without stenosis.  Scattered eccentric mixed plaque about the left bifurcation/proximal left ICA without hemodynamically significant stenosis. Proximal left ICA tortuous and medialized into the retropharyngeal space. Left ICA widely patent distally to the skull base without stenosis, dissection or occlusion. Vertebral arteries: Both vertebral arteries arise from the subclavian arteries. Strongly dominant right vertebral artery with a markedly hypoplastic left vertebral artery noted. Dominant right vertebral artery patent within the neck without stenosis, dissection, or occlusion. Markedly diminutive left vertebral artery patent as well without appreciable stenosis or other vascular abnormality. Skeleton: Alignment: Straightening of the normal cervical lordosis. No listhesis or malalignment. Skull base and vertebrae: Skull base intact. Normal C1-2 articulations are preserved in the dens is intact. Vertebral body heights maintained. No acute fracture. Soft tissues and spinal canal: Soft tissues of the neck demonstrate no acute finding. No abnormal prevertebral edema. Spinal canal within normal limits. Disc levels: Mild multilevel cervical spondylosis without significant stenosis, most pronounced at C6-7. Upper chest: Visualized upper chest demonstrates no acute finding. Partially visualized lung apices are clear. Other: None. Other neck: No other acute soft tissue abnormality within the neck. No mass lesion or adenopathy. Subcentimeter calcification noted within the left lobe of thyroid. Upper chest: Visualized upper chest demonstrates no acute finding. Partially visualized lungs are grossly clear. Review of the MIP images confirms the above findings CTA HEAD FINDINGS Anterior circulation: Petrous segments widely patent bilaterally. Scattered atheromatous plaque throughout the carotid siphons with relatively mild multifocal stenosis. A1 segments patent bilaterally. Normal anterior communicating artery complex. Anterior cerebral arteries  widely patent proximally. Short-segment severe distal right ACA stenosis noted (series 5, image 62). ACAs otherwise widely patent to their distal aspects. No M1 stenosis or occlusion. Normal MCA bifurcations. Distal MCA branches well perfused and symmetric. Posterior circulation: Dominant right vertebral artery patent to the vertebrobasilar junction without stenosis. Right PICA not visualized. Hypoplastic left vertebral artery patent to the vertebrobasilar junction as well. Left PICA patent. Basilar patent to its distal aspect without stenosis. Superior cerebral arteries patent bilaterally. Left PCA supplied via the basilar. Right PCA supplied via a hypoplastic right P1 segment and robust right posterior communicating artery. Both PCAs well perfused to their distal aspects without stenosis. Venous sinuses: Patent. Anatomic variants: Predominant fetal type origin of the right PCA. No intracranial aneurysm. Review of the MIP images confirms the above findings CT Brain Perfusion Findings: ASPECTS: 10 CBF (<30%) Volume: 0mL Perfusion (Tmax>6.0s) volume: 0mL Mismatch Volume: 0mL Infarction Location:Negative CT perfusion for acute core infarct or other perfusion deficit. IMPRESSION: CTA HEAD AND NECK IMPRESSION: 1. Negative CTA for emergent large vessel occlusion. 2. 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 3. Single short-segment severe distal right ACA stenosis as above. 4. Additional moderate atheromatous disease about the major arterial vasculature of the head and neck as above. No other hemodynamically significant or correctable stenosis. CT PERFUSION IMPRESSION: Negative CT perfusion. No evidence for acute core infarct or other perfusion deficit. CT CERVICAL SPINE IMPRESSION:  No acute traumatic injury within the cervical spine. These results were communicated to Dr. Wilford Corner at approximately 9:00 pmon 6/6/2021by text page via the [facility]  messaging system. Electronically Signed   By: Rise Mu M.D.   On: 10/01/2019 21:50    MR BRAIN WO CONTRAST   Result Date: 10/02/2019 CLINICAL DATA:  Follow-up examination for acute stroke. EXAM: MRI HEAD WITHOUT CONTRAST TECHNIQUE: Multiplanar, multiecho pulse sequences of the brain and surrounding structures were obtained without intravenous contrast. COMPARISON:  Comparison made with prior CTs from 10/01/2019. FINDINGS: Brain: Diffuse prominence of the CSF containing spaces compatible with generalized age-related cerebral atrophy. Patchy and confluent T2/FLAIR hyperintensity within the periventricular deep white matter both cerebral hemispheres most consistent with chronic small vessel ischemic disease. Patchy involvement of the deep gray nuclei and pons. Overall, appearance is moderate to advanced in nature. Superimposed remote cortical infarct at the right temporal occipital region. 13 mm focus of diffusion abnormality at the dorsal right thalamus compatible with an acute ischemic infarct (series 5, image 76). Associated signal loss on corresponding ADC map (series 6, image 25). No associated hemorrhage or mass effect. No other evidence for acute or subacute ischemia. Gray-white matter differentiation otherwise maintained. No other areas of remote cortical infarction. No evidence for acute intracranial hemorrhage. No mass lesion, midline shift or mass effect. No hydrocephalus or extra-axial fluid collection. No made of a partially empty sella. Midline structures intact. Vascular: Major intracranial vascular flow voids are maintained. Skull and upper cervical spine: Craniocervical junction within normal limits. Bone marrow signal intensity normal. No scalp soft tissue abnormality. Sinuses/Orbits: Patient status post bilateral ocular lens replacement. Globes and orbital soft tissues demonstrate no acute finding. Scattered mucosal thickening noted throughout the paranasal sinuses with superimposed  left maxillary sinus retention cyst. Trace left mastoid effusion noted, of doubtful significance. Inner ear structures grossly normal. Other: None. IMPRESSION: 1. 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 2. 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 3. Underlying age-related cerebral atrophy with moderate to advanced chronic microvascular ischemic disease. Electronically Signed   By: Rise Mu M.D.   On: 10/02/2019 02:39    CT C-SPINE NO CHARGE   Result Date: 10/01/2019 CLINICAL DATA:  Initial evaluation for acute stroke, left-sided weakness, altered mental status. EXAM: CT ANGIOGRAPHY HEAD AND NECK CT PERFUSION BRAIN TECHNIQUE: Multidetector CT imaging of the head and neck was performed using the standard protocol during bolus administration of intravenous contrast. Multiplanar CT image reconstructions and MIPs were obtained to evaluate the vascular anatomy. Carotid stenosis measurements (when applicable) are obtained utilizing NASCET criteria, using the distal internal carotid diameter as the denominator. Multiphase CT imaging of the brain was performed following IV bolus contrast injection. Subsequent parametric perfusion maps were calculated using RAPID software. CONTRAST:  OMNIPAQUE IOHEXOL 350 MG/ML SOLN COMPARISON:  Comparison made with prior noncontrast head CT from earlier same day. FINDINGS: CTA NECK FINDINGS Aortic arch: Visualized aortic arch of normal caliber with normal branch pattern. Moderate atheromatous plaque seen about the arch and origin of the great vessels without hemodynamically significant stenosis. Note made of soft plaque protruding into the lumen of the distal arch/proximal intrathoracic aorta (series 5, image 307). Visualized subclavian arteries widely patent. Right carotid system: Right common carotid artery patent from its origin to the bifurcation without flow-limiting stenosis. Scattered eccentric plaque about the  right bifurcation without significant stenosis. Just distally within the proximal right ICA, additional focal plaque is seen with associated short-segment stenosis of up  to 50% by NASCET criteria (series 5, image 169). Right ICA tortuous and medialized into the retropharyngeal space. Right ICA otherwise widely patent distally to the skull base without stenosis, dissection or occlusion. Left carotid system: Left common carotid artery patent from its origin to the bifurcation without stenosis. Scattered eccentric mixed plaque about the left bifurcation/proximal left ICA without hemodynamically significant stenosis. Proximal left ICA tortuous and medialized into the retropharyngeal space. Left ICA widely patent distally to the skull base without stenosis, dissection or occlusion. Vertebral arteries: Both vertebral arteries arise from the subclavian arteries. Strongly dominant right vertebral artery with a markedly hypoplastic left vertebral artery noted. Dominant right vertebral artery patent within the neck without stenosis, dissection, or occlusion. Markedly diminutive left vertebral artery patent as well without appreciable stenosis or other vascular abnormality. Skeleton: Alignment: Straightening of the normal cervical lordosis. No listhesis or malalignment. Skull base and vertebrae: Skull base intact. Normal C1-2 articulations are preserved in the dens is intact. Vertebral body heights maintained. No acute fracture. Soft tissues and spinal canal: Soft tissues of the neck demonstrate no acute finding. No abnormal prevertebral edema. Spinal canal within normal limits. Disc levels: Mild multilevel cervical spondylosis without significant stenosis, most pronounced at C6-7. Upper chest: Visualized upper chest demonstrates no acute finding. Partially visualized lung apices are clear. Other: None. Other neck: No other acute soft tissue abnormality within the neck. No mass lesion or adenopathy. Subcentimeter calcification  noted within the left lobe of thyroid. Upper chest: Visualized upper chest demonstrates no acute finding. Partially visualized lungs are grossly clear. Review of the MIP images confirms the above findings CTA HEAD FINDINGS Anterior circulation: Petrous segments widely patent bilaterally. Scattered atheromatous plaque throughout the carotid siphons with relatively mild multifocal stenosis. A1 segments patent bilaterally. Normal anterior communicating artery complex. Anterior cerebral arteries widely patent proximally. Short-segment severe distal right ACA stenosis noted (series 5, image 62). ACAs otherwise widely patent to their distal aspects. No M1 stenosis or occlusion. Normal MCA bifurcations. Distal MCA branches well perfused and symmetric. Posterior circulation: Dominant right vertebral artery patent to the vertebrobasilar junction without stenosis. Right PICA not visualized. Hypoplastic left vertebral artery patent to the vertebrobasilar junction as well. Left PICA patent. Basilar patent to its distal aspect without stenosis. Superior cerebral arteries patent bilaterally. Left PCA supplied via the basilar. Right PCA supplied via a hypoplastic right P1 segment and robust right posterior communicating artery. Both PCAs well perfused to their distal aspects without stenosis. Venous sinuses: Patent. Anatomic variants: Predominant fetal type origin of the right PCA. No intracranial aneurysm. Review of the MIP images confirms the above findings CT Brain Perfusion Findings: ASPECTS: 10 CBF (<30%) Volume: 0mL Perfusion (Tmax>6.0s) volume: 0mL Mismatch Volume: 0mL Infarction Location:Negative CT perfusion for acute core infarct or other perfusion deficit. IMPRESSION: CTA HEAD AND NECK IMPRESSION: 1. Negative CTA for emergent large vessel occlusion. 2. 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 3. Single short-segment severe distal right ACA  stenosis as above. 4. Additional moderate atheromatous disease about the major arterial vasculature of the head and neck as above. No other hemodynamically significant or correctable stenosis. CT PERFUSION IMPRESSION: Negative CT perfusion. No evidence for acute core infarct or other perfusion deficit. CT CERVICAL SPINE IMPRESSION: No acute traumatic injury within the cervical spine. These results were communicated to Dr. Wilford Corner at approximately 9:00 pmon 6/6/2021by text page via the [facility] messaging system. Electronically Signed   By: Rise Mu  M.D.   On: 10/01/2019 21:50    CT Code Stroke Cerebral Perfusion with contrast   Result Date: 10/01/2019 CLINICAL DATA:  Initial evaluation for acute stroke, left-sided weakness, altered mental status. EXAM: CT ANGIOGRAPHY HEAD AND NECK CT PERFUSION BRAIN TECHNIQUE: Multidetector CT imaging of the head and neck was performed using the standard protocol during bolus administration of intravenous contrast. Multiplanar CT image reconstructions and MIPs were obtained to evaluate the vascular anatomy. Carotid stenosis measurements (when applicable) are obtained utilizing NASCET criteria, using the distal internal carotid diameter as the denominator. Multiphase CT imaging of the brain was performed following IV bolus contrast injection. Subsequent parametric perfusion maps were calculated using RAPID software. CONTRAST:  OMNIPAQUE IOHEXOL 350 MG/ML SOLN COMPARISON:  Comparison made with prior noncontrast head CT from earlier same day. FINDINGS: CTA NECK FINDINGS Aortic arch: Visualized aortic arch of normal caliber with normal branch pattern. Moderate atheromatous plaque seen about the arch and origin of the great vessels without hemodynamically significant stenosis. Note made of soft plaque protruding into the lumen of the distal arch/proximal intrathoracic aorta (series 5, image 307). Visualized subclavian arteries widely patent. Right carotid system: Right  common carotid artery patent from its origin to the bifurcation without flow-limiting stenosis. Scattered eccentric plaque about the right bifurcation without significant stenosis. Just distally within the proximal right ICA, additional focal plaque is seen with associated short-segment stenosis of up to 50% by NASCET criteria (series 5, image 169). Right ICA tortuous and medialized into the retropharyngeal space. Right ICA otherwise widely patent distally to the skull base without stenosis, dissection or occlusion. Left carotid system: Left common carotid artery patent from its origin to the bifurcation without stenosis. Scattered eccentric mixed plaque about the left bifurcation/proximal left ICA without hemodynamically significant stenosis. Proximal left ICA tortuous and medialized into the retropharyngeal space. Left ICA widely patent distally to the skull base without stenosis, dissection or occlusion. Vertebral arteries: Both vertebral arteries arise from the subclavian arteries. Strongly dominant right vertebral artery with a markedly hypoplastic left vertebral artery noted. Dominant right vertebral artery patent within the neck without stenosis, dissection, or occlusion. Markedly diminutive left vertebral artery patent as well without appreciable stenosis or other vascular abnormality. Skeleton: Alignment: Straightening of the normal cervical lordosis. No listhesis or malalignment. Skull base and vertebrae: Skull base intact. Normal C1-2 articulations are preserved in the dens is intact. Vertebral body heights maintained. No acute fracture. Soft tissues and spinal canal: Soft tissues of the neck demonstrate no acute finding. No abnormal prevertebral edema. Spinal canal within normal limits. Disc levels: Mild multilevel cervical spondylosis without significant stenosis, most pronounced at C6-7. Upper chest: Visualized upper chest demonstrates no acute finding. Partially visualized lung apices are clear.  Other: None. Other neck: No other acute soft tissue abnormality within the neck. No mass lesion or adenopathy. Subcentimeter calcification noted within the left lobe of thyroid. Upper chest: Visualized upper chest demonstrates no acute finding. Partially visualized lungs are grossly clear. Review of the MIP images confirms the above findings CTA HEAD FINDINGS Anterior circulation: Petrous segments widely patent bilaterally. Scattered atheromatous plaque throughout the carotid siphons with relatively mild multifocal stenosis. A1 segments patent bilaterally. Normal anterior communicating artery complex. Anterior cerebral arteries widely patent proximally. Short-segment severe distal right ACA stenosis noted (series 5, image 62). ACAs otherwise widely patent to their distal aspects. No M1 stenosis or occlusion. Normal MCA bifurcations. Distal MCA branches well perfused and symmetric. Posterior circulation: Dominant right vertebral artery patent to the vertebrobasilar  junction without stenosis. Right PICA not visualized. Hypoplastic left vertebral artery patent to the vertebrobasilar junction as well. Left PICA patent. Basilar patent to its distal aspect without stenosis. Superior cerebral arteries patent bilaterally. Left PCA supplied via the basilar. Right PCA supplied via a hypoplastic right P1 segment and robust right posterior communicating artery. Both PCAs well perfused to their distal aspects without stenosis. Venous sinuses: Patent. Anatomic variants: Predominant fetal type origin of the right PCA. No intracranial aneurysm. Review of the MIP images confirms the above findings CT Brain Perfusion Findings: ASPECTS: 10 CBF (<30%) Volume: 0mL Perfusion (Tmax>6.0s) volume: 0mL Mismatch Volume: 0mL Infarction Location:Negative CT perfusion for acute core infarct or other perfusion deficit. IMPRESSION: CTA HEAD AND NECK IMPRESSION: 1. Negative CTA for emergent large vessel occlusion. 2. Atheromatous plaque about the  proximal right ICA with associated short-segment stenosis of up to 50% by NASCET criteria. 3. Single short-segment severe distal right ACA stenosis as above. 4. Additional moderate atheromatous disease about the major arterial vasculature of the head and neck as above. No other hemodynamically significant or correctable stenosis. CT PERFUSION IMPRESSION: Negative CT perfusion. No evidence for acute core infarct or other perfusion deficit. CT CERVICAL SPINE IMPRESSION: No acute traumatic injury within the cervical spine. These results were communicated to Dr. Wilford Corner at approximately 9:00 pmon 6/6/2021by text page via the [facility] messaging system. Electronically Signed   By: Rise Mu M.D.   On: 10/01/2019 21:50    ECHOCARDIOGRAM COMPLETE   Result Date: 10/02/2019    ECHOCARDIOGRAM REPORT   Patient Name:   [name] Good Date of Exam: 10/02/2019 Medical Rec #:  [id number]      Height:       61.0 in Accession #:    [id number]     Weight:       233.2 lb Date of Birth:  03-30-1951      BSA:          2.016 m Patient Age:    69 years       BP:           123/72 mmHg Patient Gender: F              HR:           90 bpm. Exam Location:  Inpatient Procedure: 2D Echo, Color Doppler, Cardiac Doppler and Intracardiac            Opacification Agent Indications:    Stroke i163.9  History:        Patient has no prior history of Echocardiogram examinations.  Sonographer:    Irving Burton Senior RDCS Referring Phys: [id number] VASUNDHRA RATHORE IMPRESSIONS  1. There appears to be an oblong 0.5 x 0.75 cm mobile filling defect in the LV apex, which is severely hypokinetic/aneurysmal, consistent with thrombus.. Left ventricular ejection fraction, by estimation, is 60 to 65%. The left ventricle has normal function. The left ventricle demonstrates regional wall motion abnormalities (see scoring diagram/findings for description). Left ventricular diastolic parameters are consistent with Grade I diastolic dysfunction (impaired relaxation). There  is severe akinesis of the left ventricular, apical apical segment.  2. Right ventricular systolic function is normal. The right ventricular size is normal. There is normal pulmonary artery systolic pressure. The estimated right ventricular systolic pressure is 28.8 mmHg.  3. The mitral valve is grossly normal. Trivial mitral valve regurgitation.  4. The aortic valve is tricuspid. Aortic valve regurgitation is not visualized.  5. The inferior vena cava is normal in size with  greater than 50% respiratory variability, suggesting right atrial pressure of 3 mmHg. Conclusion(s)/Recommendation(s): LV Apical thrombus is noted, anticoagulation is recommended per current guidelines. FINDINGS  Left Ventricle: There appears to be an oblong 0.5 x 0.75 cm mobile filling defect in the LV apex, which is severely hypokinetic/aneurysmal, consistent with thrombus. Left ventricular ejection fraction, by estimation, is 60 to 65%. The left ventricle has  normal function. The left ventricle demonstrates regional wall motion abnormalities. Severe akinesis of the left ventricular, apical apical segment. Definity contrast agent was given IV to delineate the left ventricular endocardial borders. The left ventricular internal cavity size was normal in size. There is no left ventricular hypertrophy. Left ventricular diastolic parameters are consistent with Grade I diastolic dysfunction (impaired relaxation). Indeterminate filling pressures. Right Ventricle: The right ventricular size is normal. No increase in right ventricular wall thickness. Right ventricular systolic function is normal. There is normal pulmonary artery systolic pressure. The tricuspid regurgitant velocity is 2.54 m/s, and  with an assumed right atrial pressure of 3 mmHg, the estimated right ventricular systolic pressure is 28.8 mmHg. Left Atrium: Left atrial size was normal in size. Right Atrium: Right atrial size was normal in size. Pericardium: There is no evidence of  pericardial effusion. Mitral Valve: The mitral valve is grossly normal. Trivial mitral valve regurgitation. Tricuspid Valve: The tricuspid valve is grossly normal. Tricuspid valve regurgitation is trivial. Aortic Valve: The aortic valve is tricuspid. Aortic valve regurgitation is not visualized. Pulmonic Valve: The pulmonic valve was normal in structure. Pulmonic valve regurgitation is not visualized. Aorta: The aortic root and ascending aorta are structurally normal, with no evidence of dilitation. Venous: The inferior vena cava is normal in size with greater than 50% respiratory variability, suggesting right atrial pressure of 3 mmHg. IAS/Shunts: No atrial level shunt detected by color flow Doppler.  LEFT VENTRICLE PLAX 2D LVIDd:         4.00 cm  Diastology LVIDs:         2.50 cm  LV e' lateral:   6.64 cm/s LV PW:         1.00 cm  LV E/e' lateral: 10.6 LV IVS:        1.00 cm  LV e' medial:    5.66 cm/s LVOT diam:     2.00 cm  LV E/e' medial:  12.4 LV SV:         63 LV SV Index:   31 LVOT Area:     3.14 cm  RIGHT VENTRICLE RV S prime:     15.40 cm/s TAPSE (M-mode): 2.0 cm LEFT ATRIUM             Index       RIGHT ATRIUM           Index LA diam:        3.60 cm 1.79 cm/m  RA Area:     12.30 cm LA Vol (A2C):   46.3 ml 22.96 ml/m RA Volume:   28.50 ml  14.13 ml/m LA Vol (A4C):   42.3 ml 20.98 ml/m LA Biplane Vol: 44.1 ml 21.87 ml/m  AORTIC VALVE LVOT Vmax:   109.00 cm/s LVOT Vmean:  74.000 cm/s LVOT VTI:    0.201 m  AORTA Ao Root diam: 3.00 cm Ao Asc diam:  3.30 cm MITRAL VALVE                TRICUSPID VALVE MV Area (PHT): 3.63 cm     TR Peak grad:   25.8  mmHg MV Decel Time: 209 msec     TR Vmax:        254.00 cm/s MV E velocity: 70.30 cm/s MV A velocity: 112.00 cm/s  SHUNTS MV E/A ratio:  0.63         Systemic VTI:  0.20 m                             Systemic Diam: 2.00 cm Lyman Bishop MD Electronically signed by Lyman Bishop MD Signature Date/Time: 10/02/2019/3:48:08 PM    Final     CT HEAD CODE STROKE WO  CONTRAST   Result Date: 10/01/2019 CLINICAL DATA:  Code stroke. Initial evaluation for acute ataxia, left-sided weakness, altered mental status. EXAM: CT HEAD WITHOUT CONTRAST TECHNIQUE: Contiguous axial images were obtained from the base of the skull through the vertex without intravenous contrast. COMPARISON:  None available. FINDINGS: Brain: Generalized age-related cerebral atrophy. Patchy and confluent hypodensity throughout the periventricular deep white matter both cerebral hemispheres, most consistent with chronic small vessel ischemic disease, advanced in nature. Chronic small microvascular ischemic changes noted involving the deep gray nuclei and pons as well. Focal encephalomalacia within the right parieto-occipital region compatible with a chronic ischemic infarct. No acute intracranial hemorrhage. No acute large vessel territory infarct. No mass lesion, midline shift or mass effect. No hydrocephalus or extra-axial fluid collection. Vascular: No hyperdense vessel. Scattered vascular calcifications noted within the carotid siphons. Skull: Scalp soft tissues demonstrate no acute finding. Calvarium intact. Sinuses/Orbits: Globes and orbital soft tissues within normal limits. Left maxillary sinus retention cyst noted. Scattered mucosal thickening noted throughout the sphenoid ethmoidal sinuses, with superimposed air-fluid level within the right sphenoid sinus. Trace left mastoid effusion. Other: None. ASPECTS [facility] Stroke Program Early CT Score) - Ganglionic level infarction (caudate, lentiform nuclei, internal capsule, insula, M1-M3 cortex): 7 - Supraganglionic infarction (M4-M6 cortex): 3 Total score (0-10 with 10 being normal): 10 IMPRESSION: 1. No acute intracranial infarct or other abnormality. 2. ASPECTS is 10. 3. Chronic right parieto-occipital infarct. 4. Underlying age-related cerebral atrophy with advanced chronic microvascular ischemic disease. Critical Value/emergent results were called by  telephone at the time of interpretation on 10/01/2019 at 7:57 pm to provider Dr. Rory Percy, Who verbally acknowledged these results. Electronically Signed   By: Jeannine Boga M.D.   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 Does the need for close, 24 hr/day medical supervision in concert with the patient's rehab needs make it unreasonable for this patient to be served in a less intensive setting? Yes 2. Co-Morbidities requiring supervision/potential complications: depression, grief, hypokalemia, RLS, CAD, LV thrombus 3. Due to bowel management, safety, skin/wound care, disease management, medication administration, pain management and patient education, does the patient require 24 hr/day rehab nursing? Yes 4. Does the patient require coordinated care of a physician, rehab nurse, therapy disciplines of PT< OT, SLP to address physical and functional deficits in the context of the above medical diagnosis(es)? Yes Addressing deficits in the following areas: balance, endurance, locomotion, strength, transferring, bowel/bladder control, bathing, dressing, feeding, grooming, toileting, cognition and speech 5. Can the patient actively participate in an intensive therapy program of at least 3 hrs of therapy per day at least 5 days per week? Yes 6. The potential for patient to make measurable gains while on inpatient rehab is good 7.  Anticipated functional outcomes upon discharge from inpatient rehab are supervision and min assist  with PT, supervision and min assist with OT, modified independent and supervision with SLP. 8. Estimated rehab length of stay to reach the above functional goals is: 2 weeks on average 9. Anticipated discharge destination: Home 10. Overall Rehab/Functional Prognosis: good   RECOMMENDATIONS: This patient's condition is appropriate for continued  rehabilitative care in the following setting: CIR Patient has agreed to participate in recommended program. Potentially Note that insurance prior authorization may be required for reimbursement for recommended care.   Comment:  1. Pt really wants to come to inpt CIR- not SNF- will submit for inpt CIR via insurance approval.  2. Suggest tramadol or something for foot pain 3. Getting put on Apixiban for LV thrombus will also cover for DVT  prophyalxis 4. Cards already in room to discuss medical implications of LV thrombus 5. Is constipated- suggest senokot-S for both laxative and stool softener 1 tab 2x/day for now- and maybe a dose of miralax to get pt to go.  6. Thank you for this consult     Charlton Amor, PA-C 10/03/2019      I have personally performed a face to face diagnostic evaluation of this patient and formulated the key components of the plan.  Additionally, I have personally reviewed laboratory data, imaging studies, as well as relevant notes and concur with the physician assistant's documentation above.              Revision History      Routing History                Note Details  Waymon Amato, MD File Time 10/03/2019  3:51 PM  Author Type Physician Status Signed  Last Editor Genice Rouge, MD Service Physical Medicine and Rehabilitation  Hospital Acct # [id number] Admit Date 10/05/2019   Genice Rouge, MD  Physician  Physical Medicine and Rehabilitation  Consult Note     Signed  Date of Service:  10/03/2019  1:46 PM      Related encounter: ED to Hosp-Admission (Discharged) from 10/01/2019 in [facility]      Signed      Expand All Collapse All  Show:Clear all [x] Manual[x] Template[] Copied  Added by: [x] Angiulli, Mcarthur Rossetti, PA-C[x] Lovorn, Aundra Millet, MD  [] Hover for details          Physical Medicine and Rehabilitation Consult Reason for Consult: Left-sided weakness with slurred speech Referring Physician: [facility]     HPI:  [name] Good is a 69 y.o. right-handed female with history of depression, CAD, lumbar spine fusion, RLS, quit smoking 6 years ago.  Per chart review patient lives with spouse independent with assistive device.  Mobile home with 3 steps to entry.  Presented 10/01/2019 with left-sided weakness and slurred speech.  Cranial CT scan negative for acute changes.  Chronic right parieto-occipital infarction.  CT angiogram of head and neck negative for emergent large vessel occlusion.  MRI of the brain showed a 13 mm acute ischemic nonhemorrhagic right thalamic infarction.  Echocardiogram with ejection fraction of 65% grade 1 diastolic dysfunction as well as LV thrombus.  Admission chemistries with potassium 2.3, creatinine 1.08.  Initially maintained on aspirin and Plavix for CVA prophylaxis and changed to Eliquis after findings of LV thrombus- Cardiologist came to speak to pt while doing consult for admission.  Therapy evaluations completed with recommendations of physical medicine rehab consult.   Pt reports she's a little constipated- LBM was  Saturday- didn't eat much, but was hard to TRY and go  yrs old  . LUMBAR LAMINECTOMY/DECOMPRESSION MICRODISCECTOMY N/A 06/14/2015    Procedure: Lumbar Three-Four,Lumbar Four-Five, Lumbar Five-Sacral One Laminectomy;  Surgeon: Tia Alert, MD;  Location: [facility];  Service: Neurosurgery;  Laterality: N/A;  . LUMBAR WOUND DEBRIDEMENT N/A 07/24/2015    Procedure: lumbar wound revision;  Surgeon: Tia Alert, MD;  Location: [facility];  Service: Neurosurgery;  Laterality: N/A;  . TOTAL HIP ARTHROPLASTY Right 10/25/2015    Procedure: RIGHT TOTAL HIP ARTHROPLASTY ANTERIOR APPROACH;  Surgeon: Kathryne Hitch, MD;  Location: [facility];  Service: Orthopedics;  Laterality: Right;  . TUBAL LIGATION        History reviewed. No pertinent family history. Social History:  reports that she quit smoking about 6 years ago. Her smoking use included cigarettes. She has a 80.00 pack-year smoking history. She has never used smokeless tobacco. She reports current alcohol use. She reports that she does not use drugs. Allergies:       Allergies  Allergen Reactions  . Amoxicillin Other (See Comments)      Tolerated Zosyn Has patient had a PCN reaction causing immediate rash, facial/tongue/throat swelling, SOB or lightheadedness with hypotension: No Has patient had a PCN reaction causing severe rash involving mucus membranes or skin necrosis: No Has patient had a PCN reaction that required hospitalization No Has patient had a PCN reaction occurring within the last 10 years: No If all of the  above answers are "NO", then may proceed with Cephalosporin use.          Medications Prior to Admission  Medication Sig Dispense Refill  . calcium carbonate (TUMS EX) 750 MG chewable tablet Chew 1 tablet by mouth 2 (two) times daily as needed for heartburn.      . fluticasone (FLONASE) 50 MCG/ACT nasal spray Place 2 sprays into both nostrils daily as needed.      Marland Kitchen ibuprofen (ADVIL) 200 MG  tablet Take 400-600 mg by mouth every 6 (six) hours as needed.      Marland Kitchen oxyCODONE-acetaminophen (PERCOCET) 7.5-325 MG tablet Take 1 tablet by mouth 4 (four) times daily as needed for pain.      . phentermine (ADIPEX-P) 37.5 MG tablet Take 37.5 mg by mouth daily as needed (appetite).       . Pseudoephedrine HCl (SUDAFED PO) Take 1 tablet by mouth daily as needed (Allergies).      Marland Kitchen rOPINIRole (REQUIP) 3 MG tablet Take 3 mg by mouth at bedtime as needed (restless leg).       Marland Kitchen tetrahydrozoline 0.05 % ophthalmic solution Place 1 drop into both eyes daily as needed (Dry eyes).      Marland Kitchen aspirin 325 MG EC tablet Take 1 tablet (325 mg total) by mouth daily. (Patient not taking: Reported on 10/02/2019) 30 tablet 0  . doxycycline (VIBRAMYCIN) 100 MG capsule Take 1 capsule (100 mg total) by mouth 2 (two) times daily. (Patient not taking: Reported on 10/02/2019) 12 capsule 0  . methocarbamol (ROBAXIN) 500 MG tablet Take 1 tablet (500 mg total) by mouth every 6 (six) hours as needed for muscle spasms. (Patient not taking: Reported on 10/02/2019) 60 tablet 1  . methylPREDNISolone (MEDROL) 4 MG tablet Medrol dose pack. Take as instructed (Patient not taking: Reported on 10/02/2019) 21 tablet 0  . predniSONE (DELTASONE) 5 MG tablet Take 1 tablet (5 mg total) by mouth daily with breakfast. (Patient not taking: Reported on 10/02/2019) 30 tablet 1      Home: Home Living Family/patient expects to be discharged to:: Private residence Living Arrangements: Spouse/significant other Available Help at Discharge: Family(spouse works,  granddaughter can help?) Type of Home: Mobile home Home Access: Stairs to enter Secretary/administrator of Steps: 3 Entrance Stairs-Rails: Left Home Layout: One level Bathroom Shower/Tub: Engineer, manufacturing systems: Handicapped height Bathroom Accessibility: Yes Home Equipment: Cane - single point, Environmental consultant - 4 wheels, Other (comment)(toilet extension) Additional Comments: question accuracy, some info taken from 09/2017 record  Functional History: Prior Function Level of Independence: Independent with assistive device(s) Comments: per 09/2017 record she used cane or rollator when outside home/in community, pt reports driving and independent with ADLs Functional Status:  Mobility: Bed Mobility Overal bed mobility: Needs Assistance Bed Mobility: Rolling, Sidelying to Sit, Sit to Supine Rolling: Min guard(to her left; right NT) Sidelying to sit: Min assist Sit to supine: Min assist, +2 for safety/equipment General bed mobility comments: pt required assist to establish sitting EOB; return to bed she was clearly stuggling with spatial perception of where she and the bed are in space; ultimately needed assist to raise legs onto bed and 2nd person at head to protect from hitting against bed rails Transfers Overall transfer level: Needs assistance Equipment used: 2 person hand held assist Transfers: Sit to/from Stand Sit to Stand: Mod assist, +2 physical assistance General transfer comment: however immediately leans posterior and requires +2 max assist to prevent posterior fall; with fatigue leaning towards her right and resisting weight shift over her RLE Ambulation/Gait General Gait Details: unable; in standing able to lift rt foot off the floor (with stable LLE) however could not begin to lift lt foot off floor due to inability to weight shift over RLE   ADL:   Cognition: Cognition Overall Cognitive Status: Impaired/Different from baseline Orientation Level: (P) Oriented to person,  Oriented to place, Oriented to time Cognition Arousal/Alertness: Lethargic(initally, improved during session) Behavior During Therapy: Anxious, Impulsive, Restless Overall Cognitive Status: Impaired/Different  from baseline Area of Impairment: Attention, Safety/judgement, Awareness, Problem solving, Following commands Current Attention Level: Sustained, Focused Following Commands: Follows one step commands consistently, Follows one step commands with increased time, Follows multi-step commands inconsistently Safety/Judgement: Decreased awareness of safety, Decreased awareness of deficits Awareness: Intellectual Problem Solving: Difficulty sequencing, Requires verbal cues, Requires tactile cues, Decreased initiation General Comments: patient tangled up in phone/call bell cords initally, then when untangled patient reaching into midair reporting "these cords are just everywhere", pt reports needing to talk to her daughter therefore handed her the phone and pt talking to daughter without phone on, assisted to call daughter and patinet voiced "hey they are in here. I'll call you back". ; pt in constant motion, poor awareness and control of L UE, no awareness of deficits or need for assist    Blood pressure (!) 124/57, pulse 90, temperature 98.6 F (37 C), temperature source Oral, resp. rate 18, height 5\' 1"  (1.549 m), weight 105.8 kg, SpO2 97 %. Physical Exam  Nursing note and vitals reviewed. Constitutional: She appears well-developed and well-nourished. No distress.  Pt slightly confused; flat affect; daughter and RN at bedside, appropriate otherwise, sitting up in bed, NAD  HENT:  Head: Normocephalic and atraumatic.  Nose: Nose normal.  Mouth/Throat: Oropharynx is clear and moist. No oropharyngeal exudate.  Absent sensation on L face/vs decreased  Eyes: Conjunctivae are normal. Right eye exhibits no discharge. Left eye exhibits no discharge. No scleral icterus.  EOMI but slow to left- R gaze  preference- will only look left when specifically asked  Neck: No tracheal deviation present.  Cardiovascular:  Borderline tachycardia- regular rhythm-   Respiratory: No stridor.  CTA B/L- no W/R/R- good air movement  GI:  Soft, NT, (+)BS -hypoactive- nondistended  Musculoskeletal:     Cervical back: Normal range of motion and neck supple.     Comments: RUE- 5/5 in biceps, triceps, WE, grip  LUE- 4/5 in same muscles RLE_ HF, KE, KF, DF and PF 5/5 LLE- 4/5 in same muscles   Neurological: She is alert.  Patient is a bit lethargic but arousable.  She displays a right gaze preference and left neglect.  She does provide her name.  Limited medical historian. Daughter at bedside- reports pt is "a little confused". Pt Ox2- to self and location, not date/day Absent sensation/abnormal sensation in LUE/LLE No clonus; no Hoffman's B/L  L neglect- wouldn't look or use L side unless asked specifically to LOOK at left hand- kept trying to reach with R hand  Skin: Skin is warm and dry.  IV LUE- forearm- looks good  Psychiatric:  Flat affect-       Lab Results Last 24 Hours  No results found for this or any previous visit (from the past 24 hour(s)).    Imaging Results (Last 48 hours)  CT Code Stroke CTA Head W/WO contrast   Result Date: 10/01/2019 CLINICAL DATA:  Initial evaluation for acute stroke, left-sided weakness, altered mental status. EXAM: CT ANGIOGRAPHY HEAD AND NECK CT PERFUSION BRAIN TECHNIQUE: Multidetector CT imaging of the head and neck was performed using the standard protocol during bolus administration of intravenous contrast. Multiplanar CT image reconstructions and MIPs were obtained to evaluate the vascular anatomy. Carotid stenosis measurements (when applicable) are obtained utilizing NASCET criteria, using the distal internal carotid diameter as the denominator. Multiphase CT imaging of the brain was performed following IV bolus contrast injection. Subsequent parametric  perfusion maps were calculated using RAPID software. CONTRAST:  OMNIPAQUE IOHEXOL 350  MG/ML SOLN COMPARISON:  Comparison made with prior noncontrast head CT from earlier same day. FINDINGS: CTA NECK FINDINGS Aortic arch: Visualized aortic arch of normal caliber with normal branch pattern. Moderate atheromatous plaque seen about the arch and origin of the great vessels without hemodynamically significant stenosis. Note made of soft plaque protruding into the lumen of the distal arch/proximal intrathoracic aorta (series 5, image 307). Visualized subclavian arteries widely patent. Right carotid system: Right common carotid artery patent from its origin to the bifurcation without flow-limiting stenosis. Scattered eccentric plaque about the right bifurcation without significant stenosis. Just distally within the proximal right ICA, additional focal plaque is seen with associated short-segment stenosis of up to 50% by NASCET criteria (series 5, image 169). Right ICA tortuous and medialized into the retropharyngeal space. Right ICA otherwise widely patent distally to the skull base without stenosis, dissection or occlusion. Left carotid system: Left common carotid artery patent from its origin to the bifurcation without stenosis. Scattered eccentric mixed plaque about the left bifurcation/proximal left ICA without hemodynamically significant stenosis. Proximal left ICA tortuous and medialized into the retropharyngeal space. Left ICA widely patent distally to the skull base without stenosis, dissection or occlusion. Vertebral arteries: Both vertebral arteries arise from the subclavian arteries. Strongly dominant right vertebral artery with a markedly hypoplastic left vertebral artery noted. Dominant right vertebral artery patent within the neck without stenosis, dissection, or occlusion. Markedly diminutive left vertebral artery patent as well without appreciable stenosis or other vascular abnormality. Skeleton:  Alignment: Straightening of the normal cervical lordosis. No listhesis or malalignment. Skull base and vertebrae: Skull base intact. Normal C1-2 articulations are preserved in the dens is intact. Vertebral body heights maintained. No acute fracture. Soft tissues and spinal canal: Soft tissues of the neck demonstrate no acute finding. No abnormal prevertebral edema. Spinal canal within normal limits. Disc levels: Mild multilevel cervical spondylosis without significant stenosis, most pronounced at C6-7. Upper chest: Visualized upper chest demonstrates no acute finding. Partially visualized lung apices are clear. Other: None. Other neck: No other acute soft tissue abnormality within the neck. No mass lesion or adenopathy. Subcentimeter calcification noted within the left lobe of thyroid. Upper chest: Visualized upper chest demonstrates no acute finding. Partially visualized lungs are grossly clear. Review of the MIP images confirms the above findings CTA HEAD FINDINGS Anterior circulation: Petrous segments widely patent bilaterally. Scattered atheromatous plaque throughout the carotid siphons with relatively mild multifocal stenosis. A1 segments patent bilaterally. Normal anterior communicating artery complex. Anterior cerebral arteries widely patent proximally. Short-segment severe distal right ACA stenosis noted (series 5, image 62). ACAs otherwise widely patent to their distal aspects. No M1 stenosis or occlusion. Normal MCA bifurcations. Distal MCA branches well perfused and symmetric. Posterior circulation: Dominant right vertebral artery patent to the vertebrobasilar junction without stenosis. Right PICA not visualized. Hypoplastic left vertebral artery patent to the vertebrobasilar junction as well. Left PICA patent. Basilar patent to its distal aspect without stenosis. Superior cerebral arteries patent bilaterally. Left PCA supplied via the basilar. Right PCA supplied via a hypoplastic right P1 segment and  robust right posterior communicating artery. Both PCAs well perfused to their distal aspects without stenosis. Venous sinuses: Patent. Anatomic variants: Predominant fetal type origin of the right PCA. No intracranial aneurysm. Review of the MIP images confirms the above findings CT Brain Perfusion Findings: ASPECTS: 10 CBF (<30%) Volume: 0mL Perfusion (Tmax>6.0s) volume: 0mL Mismatch Volume: 0mL Infarction Location:Negative CT perfusion for acute core infarct or other perfusion deficit. IMPRESSION: CTA HEAD AND NECK  IMPRESSION: 1. Negative CTA for emergent large vessel occlusion. 2. 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 3. Single short-segment severe distal right ACA stenosis as above. 4. Additional moderate atheromatous disease about the major arterial vasculature of the head and neck as above. No other hemodynamically significant or correctable stenosis. CT PERFUSION IMPRESSION: Negative CT perfusion. No evidence for acute core infarct or other perfusion deficit. CT CERVICAL SPINE IMPRESSION: No acute traumatic injury within the cervical spine. These results were communicated to Dr. Wilford Corner at approximately 9:00 pmon 6/6/2021by text page via the [facility] messaging system. Electronically Signed   By: Rise Mu M.D.   On: 10/01/2019 21:50    CT Code Stroke CTA Neck W/WO contrast   Result Date: 10/01/2019 CLINICAL DATA:  Initial evaluation for acute stroke, left-sided weakness, altered mental status. EXAM: CT ANGIOGRAPHY HEAD AND NECK CT PERFUSION BRAIN TECHNIQUE: Multidetector CT imaging of the head and neck was performed using the standard protocol during bolus administration of intravenous contrast. Multiplanar CT image reconstructions and MIPs were obtained to evaluate the vascular anatomy. Carotid stenosis measurements (when applicable) are obtained utilizing NASCET criteria, using the distal internal carotid diameter as  the denominator. Multiphase CT imaging of the brain was performed following IV bolus contrast injection. Subsequent parametric perfusion maps were calculated using RAPID software. CONTRAST:  OMNIPAQUE IOHEXOL 350 MG/ML SOLN COMPARISON:  Comparison made with prior noncontrast head CT from earlier same day. FINDINGS: CTA NECK FINDINGS Aortic arch: Visualized aortic arch of normal caliber with normal branch pattern. Moderate atheromatous plaque seen about the arch and origin of the great vessels without hemodynamically significant stenosis. Note made of soft plaque protruding into the lumen of the distal arch/proximal intrathoracic aorta (series 5, image 307). Visualized subclavian arteries widely patent. Right carotid system: Right common carotid artery patent from its origin to the bifurcation without flow-limiting stenosis. Scattered eccentric plaque about the right bifurcation without significant stenosis. Just distally within the proximal right ICA, additional focal plaque is seen with associated short-segment stenosis of up to 50% by NASCET criteria (series 5, image 169). Right ICA tortuous and medialized into the retropharyngeal space. Right ICA otherwise widely patent distally to the skull base without stenosis, dissection or occlusion. Left carotid system: Left common carotid artery patent from its origin to the bifurcation without stenosis. Scattered eccentric mixed plaque about the left bifurcation/proximal left ICA without hemodynamically significant stenosis. Proximal left ICA tortuous and medialized into the retropharyngeal space. Left ICA widely patent distally to the skull base without stenosis, dissection or occlusion. Vertebral arteries: Both vertebral arteries arise from the subclavian arteries. Strongly dominant right vertebral artery with a markedly hypoplastic left vertebral artery noted. Dominant right vertebral artery patent within the neck without stenosis, dissection, or occlusion.  Markedly diminutive left vertebral artery patent as well without appreciable stenosis or other vascular abnormality. Skeleton: Alignment: Straightening of the normal cervical lordosis. No listhesis or malalignment. Skull base and vertebrae: Skull base intact. Normal C1-2 articulations are preserved in the dens is intact. Vertebral body heights maintained. No acute fracture. Soft tissues and spinal canal: Soft tissues of the neck demonstrate no acute finding. No abnormal prevertebral edema. Spinal canal within normal limits. Disc levels: Mild multilevel cervical spondylosis without significant stenosis, most pronounced at C6-7. Upper chest: Visualized upper chest demonstrates no acute finding. Partially visualized lung apices are clear. Other: None. Other neck: No other acute soft tissue abnormality within the neck. No mass  lesion or adenopathy. Subcentimeter calcification noted within the left lobe of thyroid. Upper chest: Visualized upper chest demonstrates no acute finding. Partially visualized lungs are grossly clear. Review of the MIP images confirms the above findings CTA HEAD FINDINGS Anterior circulation: Petrous segments widely patent bilaterally. Scattered atheromatous plaque throughout the carotid siphons with relatively mild multifocal stenosis. A1 segments patent bilaterally. Normal anterior communicating artery complex. Anterior cerebral arteries widely patent proximally. Short-segment severe distal right ACA stenosis noted (series 5, image 62). ACAs otherwise widely patent to their distal aspects. No M1 stenosis or occlusion. Normal MCA bifurcations. Distal MCA branches well perfused and symmetric. Posterior circulation: Dominant right vertebral artery patent to the vertebrobasilar junction without stenosis. Right PICA not visualized. Hypoplastic left vertebral artery patent to the vertebrobasilar junction as well. Left PICA patent. Basilar patent to its distal aspect without stenosis. Superior  cerebral arteries patent bilaterally. Left PCA supplied via the basilar. Right PCA supplied via a hypoplastic right P1 segment and robust right posterior communicating artery. Both PCAs well perfused to their distal aspects without stenosis. Venous sinuses: Patent. Anatomic variants: Predominant fetal type origin of the right PCA. No intracranial aneurysm. Review of the MIP images confirms the above findings CT Brain Perfusion Findings: ASPECTS: 10 CBF (<30%) Volume: 0mL Perfusion (Tmax>6.0s) volume: 0mL Mismatch Volume: 0mL Infarction Location:Negative CT perfusion for acute core infarct or other perfusion deficit. IMPRESSION: CTA HEAD AND NECK IMPRESSION: 1. Negative CTA for emergent large vessel occlusion. 2. 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 3. Single short-segment severe distal right ACA stenosis as above. 4. Additional moderate atheromatous disease about the major arterial vasculature of the head and neck as above. No other hemodynamically significant or correctable stenosis. CT PERFUSION IMPRESSION: Negative CT perfusion. No evidence for acute core infarct or other perfusion deficit. CT CERVICAL SPINE IMPRESSION: No acute traumatic injury within the cervical spine. These results were communicated to Dr. Wilford CornerArora at approximately 9:00 pmon 6/6/2021by text page via the [facility] messaging system. Electronically Signed   By: Rise MuBenjamin  McClintock M.D.   On: 10/01/2019 21:50    MR BRAIN WO CONTRAST   Result Date: 10/02/2019 CLINICAL DATA:  Follow-up examination for acute stroke. EXAM: MRI HEAD WITHOUT CONTRAST TECHNIQUE: Multiplanar, multiecho pulse sequences of the brain and surrounding structures were obtained without intravenous contrast. COMPARISON:  Comparison made with prior CTs from 10/01/2019. FINDINGS: Brain: Diffuse prominence of the CSF containing spaces compatible with generalized age-related cerebral atrophy. Patchy and  confluent T2/FLAIR hyperintensity within the periventricular deep white matter both cerebral hemispheres most consistent with chronic small vessel ischemic disease. Patchy involvement of the deep gray nuclei and pons. Overall, appearance is moderate to advanced in nature. Superimposed remote cortical infarct at the right temporal occipital region. 13 mm focus of diffusion abnormality at the dorsal right thalamus compatible with an acute ischemic infarct (series 5, image 76). Associated signal loss on corresponding ADC map (series 6, image 25). No associated hemorrhage or mass effect. No other evidence for acute or subacute ischemia. Gray-white matter differentiation otherwise maintained. No other areas of remote cortical infarction. No evidence for acute intracranial hemorrhage. No mass lesion, midline shift or mass effect. No hydrocephalus or extra-axial fluid collection. No made of a partially empty sella. Midline structures intact. Vascular: Major intracranial vascular flow voids are maintained. Skull and upper cervical spine: Craniocervical junction within normal limits. Bone marrow signal intensity normal. No scalp soft tissue abnormality. Sinuses/Orbits: Patient status post  bilateral ocular lens replacement. Globes and orbital soft tissues demonstrate no acute finding. Scattered mucosal thickening noted throughout the paranasal sinuses with superimposed left maxillary sinus retention cyst. Trace left mastoid effusion noted, of doubtful significance. Inner ear structures grossly normal. Other: None. IMPRESSION: 1. 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 2. 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 3. Underlying age-related cerebral atrophy with moderate to advanced chronic microvascular ischemic disease. Electronically Signed   By: Rise Mu M.D.   On: 10/02/2019 02:39    CT C-SPINE NO CHARGE   Result Date: 10/01/2019 CLINICAL DATA:  Initial  evaluation for acute stroke, left-sided weakness, altered mental status. EXAM: CT ANGIOGRAPHY HEAD AND NECK CT PERFUSION BRAIN TECHNIQUE: Multidetector CT imaging of the head and neck was performed using the standard protocol during bolus administration of intravenous contrast. Multiplanar CT image reconstructions and MIPs were obtained to evaluate the vascular anatomy. Carotid stenosis measurements (when applicable) are obtained utilizing NASCET criteria, using the distal internal carotid diameter as the denominator. Multiphase CT imaging of the brain was performed following IV bolus contrast injection. Subsequent parametric perfusion maps were calculated using RAPID software. CONTRAST:  OMNIPAQUE IOHEXOL 350 MG/ML SOLN COMPARISON:  Comparison made with prior noncontrast head CT from earlier same day. FINDINGS: CTA NECK FINDINGS Aortic arch: Visualized aortic arch of normal caliber with normal branch pattern. Moderate atheromatous plaque seen about the arch and origin of the great vessels without hemodynamically significant stenosis. Note made of soft plaque protruding into the lumen of the distal arch/proximal intrathoracic aorta (series 5, image 307). Visualized subclavian arteries widely patent. Right carotid system: Right common carotid artery patent from its origin to the bifurcation without flow-limiting stenosis. Scattered eccentric plaque about the right bifurcation without significant stenosis. Just distally within the proximal right ICA, additional focal plaque is seen with associated short-segment stenosis of up to 50% by NASCET criteria (series 5, image 169). Right ICA tortuous and medialized into the retropharyngeal space. Right ICA otherwise widely patent distally to the skull base without stenosis, dissection or occlusion. Left carotid system: Left common carotid artery patent from its origin to the bifurcation without stenosis. Scattered eccentric mixed plaque about the left  bifurcation/proximal left ICA without hemodynamically significant stenosis. Proximal left ICA tortuous and medialized into the retropharyngeal space. Left ICA widely patent distally to the skull base without stenosis, dissection or occlusion. Vertebral arteries: Both vertebral arteries arise from the subclavian arteries. Strongly dominant right vertebral artery with a markedly hypoplastic left vertebral artery noted. Dominant right vertebral artery patent within the neck without stenosis, dissection, or occlusion. Markedly diminutive left vertebral artery patent as well without appreciable stenosis or other vascular abnormality. Skeleton: Alignment: Straightening of the normal cervical lordosis. No listhesis or malalignment. Skull base and vertebrae: Skull base intact. Normal C1-2 articulations are preserved in the dens is intact. Vertebral body heights maintained. No acute fracture. Soft tissues and spinal canal: Soft tissues of the neck demonstrate no acute finding. No abnormal prevertebral edema. Spinal canal within normal limits. Disc levels: Mild multilevel cervical spondylosis without significant stenosis, most pronounced at C6-7. Upper chest: Visualized upper chest demonstrates no acute finding. Partially visualized lung apices are clear. Other: None. Other neck: No other acute soft tissue abnormality within the neck. No mass lesion or adenopathy. Subcentimeter calcification noted within the left lobe of thyroid. Upper chest: Visualized upper chest demonstrates no acute finding. Partially visualized lungs are grossly clear. Review of the MIP images confirms the above findings  CTA HEAD FINDINGS Anterior circulation: Petrous segments widely patent bilaterally. Scattered atheromatous plaque throughout the carotid siphons with relatively mild multifocal stenosis. A1 segments patent bilaterally. Normal anterior communicating artery complex. Anterior cerebral arteries widely patent proximally. Short-segment severe  distal right ACA stenosis noted (series 5, image 62). ACAs otherwise widely patent to their distal aspects. No M1 stenosis or occlusion. Normal MCA bifurcations. Distal MCA branches well perfused and symmetric. Posterior circulation: Dominant right vertebral artery patent to the vertebrobasilar junction without stenosis. Right PICA not visualized. Hypoplastic left vertebral artery patent to the vertebrobasilar junction as well. Left PICA patent. Basilar patent to its distal aspect without stenosis. Superior cerebral arteries patent bilaterally. Left PCA supplied via the basilar. Right PCA supplied via a hypoplastic right P1 segment and robust right posterior communicating artery. Both PCAs well perfused to their distal aspects without stenosis. Venous sinuses: Patent. Anatomic variants: Predominant fetal type origin of the right PCA. No intracranial aneurysm. Review of the MIP images confirms the above findings CT Brain Perfusion Findings: ASPECTS: 10 CBF (<30%) Volume: 0mL Perfusion (Tmax>6.0s) volume: 0mL Mismatch Volume: 0mL Infarction Location:Negative CT perfusion for acute core infarct or other perfusion deficit. IMPRESSION: CTA HEAD AND NECK IMPRESSION: 1. Negative CTA for emergent large vessel occlusion. 2. 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 3. Single short-segment severe distal right ACA stenosis as above. 4. Additional moderate atheromatous disease about the major arterial vasculature of the head and neck as above. No other hemodynamically significant or correctable stenosis. CT PERFUSION IMPRESSION: Negative CT perfusion. No evidence for acute core infarct or other perfusion deficit. CT CERVICAL SPINE IMPRESSION: No acute traumatic injury within the cervical spine. These results were communicated to Dr. Wilford Corner at approximately 9:00 pmon 6/6/2021by text page via the [facility] messaging system. Electronically Signed   By:  Rise Mu M.D.   On: 10/01/2019 21:50    CT Code Stroke Cerebral Perfusion with contrast   Result Date: 10/01/2019 CLINICAL DATA:  Initial evaluation for acute stroke, left-sided weakness, altered mental status. EXAM: CT ANGIOGRAPHY HEAD AND NECK CT PERFUSION BRAIN TECHNIQUE: Multidetector CT imaging of the head and neck was performed using the standard protocol during bolus administration of intravenous contrast. Multiplanar CT image reconstructions and MIPs were obtained to evaluate the vascular anatomy. Carotid stenosis measurements (when applicable) are obtained utilizing NASCET criteria, using the distal internal carotid diameter as the denominator. Multiphase CT imaging of the brain was performed following IV bolus contrast injection. Subsequent parametric perfusion maps were calculated using RAPID software. CONTRAST:  OMNIPAQUE IOHEXOL 350 MG/ML SOLN COMPARISON:  Comparison made with prior noncontrast head CT from earlier same day. FINDINGS: CTA NECK FINDINGS Aortic arch: Visualized aortic arch of normal caliber with normal branch pattern. Moderate atheromatous plaque seen about the arch and origin of the great vessels without hemodynamically significant stenosis. Note made of soft plaque protruding into the lumen of the distal arch/proximal intrathoracic aorta (series 5, image 307). Visualized subclavian arteries widely patent. Right carotid system: Right common carotid artery patent from its origin to the bifurcation without flow-limiting stenosis. Scattered eccentric plaque about the right bifurcation without significant stenosis. Just distally within the proximal right ICA, additional focal plaque is seen with associated short-segment stenosis of up to 50% by NASCET criteria (series 5, image 169). Right ICA tortuous and medialized into the retropharyngeal space. Right ICA otherwise widely patent distally to the skull base without stenosis, dissection or occlusion. Left carotid  system:  Left common carotid artery patent from its origin to the bifurcation without stenosis. Scattered eccentric mixed plaque about the left bifurcation/proximal left ICA without hemodynamically significant stenosis. Proximal left ICA tortuous and medialized into the retropharyngeal space. Left ICA widely patent distally to the skull base without stenosis, dissection or occlusion. Vertebral arteries: Both vertebral arteries arise from the subclavian arteries. Strongly dominant right vertebral artery with a markedly hypoplastic left vertebral artery noted. Dominant right vertebral artery patent within the neck without stenosis, dissection, or occlusion. Markedly diminutive left vertebral artery patent as well without appreciable stenosis or other vascular abnormality. Skeleton: Alignment: Straightening of the normal cervical lordosis. No listhesis or malalignment. Skull base and vertebrae: Skull base intact. Normal C1-2 articulations are preserved in the dens is intact. Vertebral body heights maintained. No acute fracture. Soft tissues and spinal canal: Soft tissues of the neck demonstrate no acute finding. No abnormal prevertebral edema. Spinal canal within normal limits. Disc levels: Mild multilevel cervical spondylosis without significant stenosis, most pronounced at C6-7. Upper chest: Visualized upper chest demonstrates no acute finding. Partially visualized lung apices are clear. Other: None. Other neck: No other acute soft tissue abnormality within the neck. No mass lesion or adenopathy. Subcentimeter calcification noted within the left lobe of thyroid. Upper chest: Visualized upper chest demonstrates no acute finding. Partially visualized lungs are grossly clear. Review of the MIP images confirms the above findings CTA HEAD FINDINGS Anterior circulation: Petrous segments widely patent bilaterally. Scattered atheromatous plaque throughout the carotid siphons with relatively mild multifocal stenosis. A1 segments  patent bilaterally. Normal anterior communicating artery complex. Anterior cerebral arteries widely patent proximally. Short-segment severe distal right ACA stenosis noted (series 5, image 62). ACAs otherwise widely patent to their distal aspects. No M1 stenosis or occlusion. Normal MCA bifurcations. Distal MCA branches well perfused and symmetric. Posterior circulation: Dominant right vertebral artery patent to the vertebrobasilar junction without stenosis. Right PICA not visualized. Hypoplastic left vertebral artery patent to the vertebrobasilar junction as well. Left PICA patent. Basilar patent to its distal aspect without stenosis. Superior cerebral arteries patent bilaterally. Left PCA supplied via the basilar. Right PCA supplied via a hypoplastic right P1 segment and robust right posterior communicating artery. Both PCAs well perfused to their distal aspects without stenosis. Venous sinuses: Patent. Anatomic variants: Predominant fetal type origin of the right PCA. No intracranial aneurysm. Review of the MIP images confirms the above findings CT Brain Perfusion Findings: ASPECTS: 10 CBF (<30%) Volume: 0mL Perfusion (Tmax>6.0s) volume: 0mL Mismatch Volume: 0mL Infarction Location:Negative CT perfusion for acute core infarct or other perfusion deficit. IMPRESSION: CTA HEAD AND NECK IMPRESSION: 1. Negative CTA for emergent large vessel occlusion. 2. 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 3. Single short-segment severe distal right ACA stenosis as above. 4. Additional moderate atheromatous disease about the major arterial vasculature of the head and neck as above. No other hemodynamically significant or correctable stenosis. CT PERFUSION IMPRESSION: Negative CT perfusion. No evidence for acute core infarct or other perfusion deficit. CT CERVICAL SPINE IMPRESSION: No acute traumatic injury within the cervical spine. These results were  communicated to Dr. Wilford Corner at approximately 9:00 pmon 6/6/2021by text page via the [facility] messaging system. Electronically Signed   By: Rise Mu M.D.   On: 10/01/2019 21:50    ECHOCARDIOGRAM COMPLETE   Result Date: 10/02/2019    ECHOCARDIOGRAM REPORT   Patient Name:   [name] Good Date of Exam: 10/02/2019  Medical Rec #:  [id number]      Height:       61.0 in Accession #:    [id number]     Weight:       233.2 lb Date of Birth:  August 02, 1950      BSA:          2.016 m Patient Age:    69 years       BP:           123/72 mmHg Patient Gender: F              HR:           90 bpm. Exam Location:  Inpatient Procedure: 2D Echo, Color Doppler, Cardiac Doppler and Intracardiac            Opacification Agent Indications:    Stroke i163.9  History:        Patient has no prior history of Echocardiogram examinations.  Sonographer:    Irving Burton Senior RDCS Referring Phys: [id number] VASUNDHRA RATHORE IMPRESSIONS  1. There appears to be an oblong 0.5 x 0.75 cm mobile filling defect in the LV apex, which is severely hypokinetic/aneurysmal, consistent with thrombus.. Left ventricular ejection fraction, by estimation, is 60 to 65%. The left ventricle has normal function. The left ventricle demonstrates regional wall motion abnormalities (see scoring diagram/findings for description). Left ventricular diastolic parameters are consistent with Grade I diastolic dysfunction (impaired relaxation). There is severe akinesis of the left ventricular, apical apical segment.  2. Right ventricular systolic function is normal. The right ventricular size is normal. There is normal pulmonary artery systolic pressure. The estimated right ventricular systolic pressure is 28.8 mmHg.  3. The mitral valve is grossly normal. Trivial mitral valve regurgitation.  4. The aortic valve is tricuspid. Aortic valve regurgitation is not visualized.  5. The inferior vena cava is normal in size with greater than 50% respiratory variability, suggesting right atrial  pressure of 3 mmHg. Conclusion(s)/Recommendation(s): LV Apical thrombus is noted, anticoagulation is recommended per current guidelines. FINDINGS  Left Ventricle: There appears to be an oblong 0.5 x 0.75 cm mobile filling defect in the LV apex, which is severely hypokinetic/aneurysmal, consistent with thrombus. Left ventricular ejection fraction, by estimation, is 60 to 65%. The left ventricle has  normal function. The left ventricle demonstrates regional wall motion abnormalities. Severe akinesis of the left ventricular, apical apical segment. Definity contrast agent was given IV to delineate the left ventricular endocardial borders. The left ventricular internal cavity size was normal in size. There is no left ventricular hypertrophy. Left ventricular diastolic parameters are consistent with Grade I diastolic dysfunction (impaired relaxation). Indeterminate filling pressures. Right Ventricle: The right ventricular size is normal. No increase in right ventricular wall thickness. Right ventricular systolic function is normal. There is normal pulmonary artery systolic pressure. The tricuspid regurgitant velocity is 2.54 m/s, and  with an assumed right atrial pressure of 3 mmHg, the estimated right ventricular systolic pressure is 28.8 mmHg. Left Atrium: Left atrial size was normal in size. Right Atrium: Right atrial size was normal in size. Pericardium: There is no evidence of pericardial effusion. Mitral Valve: The mitral valve is grossly normal. Trivial mitral valve regurgitation. Tricuspid Valve: The tricuspid valve is grossly normal. Tricuspid valve regurgitation is trivial. Aortic Valve: The aortic valve is tricuspid. Aortic valve regurgitation is not visualized. Pulmonic Valve: The pulmonic valve was normal in structure. Pulmonic valve regurgitation is not visualized. Aorta: The aortic root and ascending aorta are structurally normal, with  no evidence of dilitation. Venous: The inferior vena cava is normal in  size with greater than 50% respiratory variability, suggesting right atrial pressure of 3 mmHg. IAS/Shunts: No atrial level shunt detected by color flow Doppler.  LEFT VENTRICLE PLAX 2D LVIDd:         4.00 cm  Diastology LVIDs:         2.50 cm  LV e' lateral:   6.64 cm/s LV PW:         1.00 cm  LV E/e' lateral: 10.6 LV IVS:        1.00 cm  LV e' medial:    5.66 cm/s LVOT diam:     2.00 cm  LV E/e' medial:  12.4 LV SV:         63 LV SV Index:   31 LVOT Area:     3.14 cm  RIGHT VENTRICLE RV S prime:     15.40 cm/s TAPSE (M-mode): 2.0 cm LEFT ATRIUM             Index       RIGHT ATRIUM           Index LA diam:        3.60 cm 1.79 cm/m  RA Area:     12.30 cm LA Vol (A2C):   46.3 ml 22.96 ml/m RA Volume:   28.50 ml  14.13 ml/m LA Vol (A4C):   42.3 ml 20.98 ml/m LA Biplane Vol: 44.1 ml 21.87 ml/m  AORTIC VALVE LVOT Vmax:   109.00 cm/s LVOT Vmean:  74.000 cm/s LVOT VTI:    0.201 m  AORTA Ao Root diam: 3.00 cm Ao Asc diam:  3.30 cm MITRAL VALVE                TRICUSPID VALVE MV Area (PHT): 3.63 cm     TR Peak grad:   25.8 mmHg MV Decel Time: 209 msec     TR Vmax:        254.00 cm/s MV E velocity: 70.30 cm/s MV A velocity: 112.00 cm/s  SHUNTS MV E/A ratio:  0.63         Systemic VTI:  0.20 m                             Systemic Diam: 2.00 cm Zoila Shutter MD Electronically signed by Zoila Shutter MD Signature Date/Time: 10/02/2019/3:48:08 PM    Final     CT HEAD CODE STROKE WO CONTRAST   Result Date: 10/01/2019 CLINICAL DATA:  Code stroke. Initial evaluation for acute ataxia, left-sided weakness, altered mental status. EXAM: CT HEAD WITHOUT CONTRAST TECHNIQUE: Contiguous axial images were obtained from the base of the skull through the vertex without intravenous contrast. COMPARISON:  None available. FINDINGS: Brain: Generalized age-related cerebral atrophy. Patchy and confluent hypodensity throughout the periventricular deep white matter both cerebral hemispheres, most consistent with chronic small vessel ischemic  disease, advanced in nature. Chronic small microvascular ischemic changes noted involving the deep gray nuclei and pons as well. Focal encephalomalacia within the right parieto-occipital region compatible with a chronic ischemic infarct. No acute intracranial hemorrhage. No acute large vessel territory infarct. No mass lesion, midline shift or mass effect. No hydrocephalus or extra-axial fluid collection. Vascular: No hyperdense vessel. Scattered vascular calcifications noted within the carotid siphons. Skull: Scalp soft tissues demonstrate no acute finding. Calvarium intact. Sinuses/Orbits: Globes and orbital soft tissues within normal limits. Left maxillary sinus retention cyst  noted. Scattered mucosal thickening noted throughout the sphenoid ethmoidal sinuses, with superimposed air-fluid level within the right sphenoid sinus. Trace left mastoid effusion. Other: None. ASPECTS [facility] Stroke Program Early CT Score) - Ganglionic level infarction (caudate, lentiform nuclei, internal capsule, insula, M1-M3 cortex): 7 - Supraganglionic infarction (M4-M6 cortex): 3 Total score (0-10 with 10 being normal): 10 IMPRESSION: 1. No acute intracranial infarct or other abnormality. 2. ASPECTS is 10. 3. Chronic right parieto-occipital infarct. 4. Underlying age-related cerebral atrophy with advanced chronic microvascular ischemic disease. Critical Value/emergent results were called by telephone at the time of interpretation on 10/01/2019 at 7:57 pm to provider Dr. Wilford Corner, Who verbally acknowledged these results. Electronically Signed   By: Rise Mu M.D.   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 Does the need for close, 24 hr/day medical supervision in concert with the patient's rehab needs make it unreasonable for this patient to be served in a less intensive setting?  Yes 12. Co-Morbidities requiring supervision/potential complications: depression, grief, hypokalemia, RLS, CAD, LV thrombus 13. Due to bowel management, safety, skin/wound care, disease management, medication administration, pain management and patient education, does the patient require 24 hr/day rehab nursing? Yes 14. Does the patient require coordinated care of a physician, rehab nurse, therapy disciplines of PT< OT, SLP to address physical and functional deficits in the context of the above medical diagnosis(es)? Yes Addressing deficits in the following areas: balance, endurance, locomotion, strength, transferring, bowel/bladder control, bathing, dressing, feeding, grooming, toileting, cognition and speech 15. Can the patient actively participate in an intensive therapy program of at least 3 hrs of therapy per day at least 5 days per week? Yes 16. The potential for patient to make measurable gains while on inpatient rehab is good 17. Anticipated functional outcomes upon discharge from inpatient rehab are supervision and min assist  with PT, supervision and min assist with OT, modified independent and supervision with SLP. 18. Estimated rehab length of stay to reach the above functional goals is: 2 weeks on average 19. Anticipated discharge destination: Home 20. Overall Rehab/Functional Prognosis: good   RECOMMENDATIONS: This patient's condition is appropriate for continued rehabilitative care in the following setting: CIR Patient has agreed to participate in recommended program. Potentially Note that insurance prior authorization may be required for reimbursement for recommended care.   Comment:  1. Pt really wants to come to inpt CIR- not SNF- will submit for inpt CIR via insurance approval.  2. Suggest tramadol or something for foot pain 3. Getting put on Apixiban for LV thrombus will also cover for DVT  prophyalxis 4. Cards already in room to discuss medical implications of LV  thrombus 5. Is constipated- suggest senokot-S for both laxative and stool softener 1 tab 2x/day for now- and maybe a dose of miralax to get pt to go.  6. Thank you for this consult     Charlton Amor, PA-C 10/03/2019      I have personally performed a face to face diagnostic evaluation of this patient and formulated the key components of the plan.  Additionally, I have personally reviewed laboratory data, imaging studies, as well as relevant notes and concur with the physician assistant's documentation above.              Revision History  Routing History                Note Details  Waymon Amato, MD File Time 10/03/2019  3:51 PM  Author Type Physician Status Signed  Last Editor Genice Rouge, MD Service Physical Medicine and Rehabilitation  Hospital Acct # [id number] Admit Date 10/05/2019

## 2019-10-05 NOTE — Progress Notes (Signed)
Inpatient Rehabilitation Medication Review by a Pharmacist  A complete drug regimen review was completed for this patient to identify any potential clinically significant medication issues.  Clinically significant medication issues were identified:  no   Type of Medication Issue Identified Description of Issue Urgent (address now) Non-Urgent (address on AM team rounds) Plan Plan Accepted by Provider? (Yes / No / Pending AM Rounds)  Drug Interaction(s) (clinically significant)       Duplicate Therapy       Allergy       No Medication Administration End Date       Incorrect Dose       Additional Drug Therapy Needed       Other         Name of provider notified for urgent issues identified:   Provider Method of Notification:    For non-urgent medication issues to be resolved on team rounds tomorrow morning a [facility] Secure Chat Handoff was sent to:    Pharmacist comments:   Time spent performing this drug regimen review (minutes):  10 minutes   Haliey Romberg 10/05/2019 3:29 PM

## 2019-10-05 NOTE — Progress Notes (Addendum)
Report received from [place], [place] from [place]. Stated that patient was alert and able to make needs known. Patient is able to move all extremities today, but yesterday she could hardly talk or use her left side. She is much better today. Patient had a CVA with fall at home and was brought in to the ER by EMS. Patient has upper dentures, heart healthy diet, and bilateral foot pain.    Patient arrived about 1530 to the unit via bed. Patient had her daughter [name] Good by her side. Patient is alert and able to make needs known. Patient was educated on the room call system, floor and what to expect for tomorrow from therapy services. Patient is very pleasant. Lungs are CTA, abdomen soft, non-tender, bowel sounds in all 4 quads. Patient has bruising to bilateral upper and lower extremities. Patient has a large purple bruise to the upper inner left arm around the elbow. Patient stated it was from an IV that went bad. Patients bilateral feet are sensitive and hurt at times. Patient has some non-pitting edema noted to the LLE, patient stated that has been going on for a while now and she is not sure why.

## 2019-10-05 NOTE — Progress Notes (Signed)
Standley BrookingBoyette, Payten Beaumier G, RN  Rehab Admission Coordinator  Physical Medicine and Rehabilitation  PMR Pre-admission     Signed  Date of Service:  10/05/2019 10:59 AM      Related encounter: ED to Hosp-Admission (Discharged) from 10/01/2019 in [facility]      Signed       Show:Clear all [x] Manual[x] Template[x] Copied  Added by: [x] Standley BrookingBoyette, Stoney Karczewski G, RN  [] Hover for details PMR Admission Coordinator Pre-Admission Assessment   Patient: [name] Good is an 69 y.o., female MRN: [id number] DOB: June 03, 1950 Height: 5\' 1"  (154.9 cm) Weight: 105.8 kg                                                                                                                                             Insurance Information HMO: yes    PPO:      PCP:      IPA:      80/20:      OTHER:  PRIMARY: [employer] Medicare      Policy#: [id number]      Subscriber: pt CM Name: Toni AmendCourtney      Phone#: [phone]     Fax#: [phone] Pre-Cert#: [id number]      Employer:  Benefits:  Phone #: [phone]     Name: 6/9 Eff. Date: 04/28/2019     Deduct: none      Out of Pocket Max: $4500      Life Max: none  CIR: $325 co pay per day days 1 until 354      SNF: no copay days 1 until 20; $184 per day days 21 until 45; no copay days 46 until 100 Outpatient: $35 per visit     Co-Pay: visits per medical neccesity Home Health: 100%      Co-Pay: visits per medical neccesity DME: 80%     Co-Pay: 20% Providers: in network  SECONDARY: none   Financial Counselor:       Phone#:    The Data processing manager"Data Collection Information Summary" for patients in Inpatient Rehabilitation Facilities with attached "Privacy Act Statement-Health Care Records" was provided and verbally reviewed with: Patient and Family   Emergency Contact Information         Contact Information     Name Relation Home Work Mobile    BrendaHawks,Jerry W Spouse [phone]        Lamarr LulasRussell,Adalaide Daughter [phone]   [phone]       Current  Medical History  Patient Admitting Diagnosis: CVA   History of Present Illness:  69 year old right-handed female with history of depression, CAD, lumbar spine fusion complicated by wound dehiscence 2017, RLS, quit smoking 6 years ago.   Presented 10/01/2019 with left-sided weakness and slurred speech.  Cranial CT scan negative for acute changes.  Chronic right parieto-occipital infarction.  CT angiogram of head and neck negative for emergent large vessel occlusion.  MRI of the brain showed a 13 mm acute ischemic nonhemorrhagic right thalamic infarction.  Echocardiogram with ejection fraction of 65% grade 1 diastolic dysfunction as well as LV thrombus.  Admission chemistries with potassium 2.3, creatinine 1.08.  Initially maintained on aspirin and Plavix for CVA prophylaxis changed to Eliquis after findings of LV thrombus.  Plans to follow-up cardiology services Dr. Algie Coffer in regards to LV apical thrombus with repeat echocardiogram in 3 months.  Tolerating a regular diet.    Complete NIHSS TOTAL: 5 Glasgow Coma Scale Score: 14   Past Medical History      Past Medical History:  Diagnosis Date  . Arthritis    . Depression    . GERD (gastroesophageal reflux disease)    . Myocardial infarction (HCC) 89    no visits to cardiac dr(thomas kelly) since 89  . Wound dehiscence      lumbar      Family History  family history is not on file.   Prior Rehab/Hospitalizations:  Has the patient had prior rehab or hospitalizations prior to admission? Yes   Has the patient had major surgery during 100 days prior to admission? No   Current Medications    Current Facility-Administered Medications:  .   stroke: mapping our early stages of recovery book, , Does not apply, Once, John Giovanni, MD .  acetaminophen (TYLENOL) tablet 650 mg, 650 mg, Oral, Q4H PRN, 650 mg at 10/05/19 0856 **OR** acetaminophen (TYLENOL) 160 MG/5ML solution 650 mg, 650 mg, Per Tube, Q4H PRN, 650 mg at 10/02/19 2002 **OR**  acetaminophen (TYLENOL) suppository 650 mg, 650 mg, Rectal, Q4H PRN, John Giovanni, MD .  apixaban (ELIQUIS) tablet 10 mg, 10 mg, Oral, BID, 10 mg at 10/05/19 0856 **FOLLOWED BY** [START ON 10/10/2019] apixaban (ELIQUIS) tablet 5 mg, 5 mg, Oral, BID, Marinda Elk, MD .  atorvastatin (LIPITOR) tablet 80 mg, 80 mg, Oral, Daily, John Giovanni, MD, 80 mg at 10/05/19 0900 .  feeding supplement (BOOST / RESOURCE BREEZE) liquid 1 Container, 1 Container, Oral, TID BM, Marinda Elk, MD, 1 Container at 10/04/19 2100 .  melatonin tablet 3 mg, 3 mg, Oral, QHS, Ghimire, Kuber, MD, 3 mg at 10/04/19 2109 .  methocarbamol (ROBAXIN) tablet 500 mg, 500 mg, Oral, Q8H PRN, Marikay Alar, FNP, 500 mg at 10/05/19 0900 .  multivitamin with minerals tablet 1 tablet, 1 tablet, Oral, Daily, Marinda Elk, MD, 1 tablet at 10/05/19 0900 .  oxyCODONE-acetaminophen (PERCOCET/ROXICET) 5-325 MG per tablet 1 tablet, 1 tablet, Oral, Q6H PRN, Dorcas Carrow, MD, 1 tablet at 10/05/19 0340 .  potassium chloride SA (KLOR-CON) CR tablet 20 mEq, 20 mEq, Oral, BID, Marinda Elk, MD, 20 mEq at 10/05/19 0900 .  rOPINIRole (REQUIP) tablet 3 mg, 3 mg, Oral, QHS PRN, John Giovanni, MD, 3 mg at 10/04/19 2109   Patients Current Diet:     Diet Order                      Diet - low sodium heart healthy              Diet Heart Room service appropriate? Yes; Fluid consistency: Thin  Diet effective now                      Precautions / Restrictions Precautions Precautions: Fall Restrictions Weight Bearing Restrictions: No    Has the patient had 2 or more falls or a fall with injury in the past year?No  Prior Activity Level Limited Community (1-2x/wk): independent and driving   Prior Functional Level Prior Function Level of Independence: Independent with assistive device(s) Comments: used cane pta   Self Care: Did the patient need help bathing, dressing, using the toilet or  eating?  Independent   Indoor Mobility: Did the patient need assistance with walking from room to room (with or without device)? Independent   Stairs: Did the patient need assistance with internal or external stairs (with or without device)? Independent   Functional Cognition: Did the patient need help planning regular tasks such as shopping or remembering to take medications? Independent   Home Assistive Devices / Equipment Home Assistive Devices/Equipment: Cane (specify quad or straight) Home Equipment: Cane - single point, Walker - 4 wheels, Other (comment) (toilet extension)   Prior Device Use: Indicate devices/aids used by the patient prior to current illness, exacerbation or injury? cane   Current Functional Level Cognition   Arousal/Alertness: Awake/alert Overall Cognitive Status: Impaired/Different from baseline Current Attention Level: Focused Orientation Level: Oriented to person, Oriented to place, Oriented to time, Disoriented to situation Following Commands: Follows one step commands consistently, Follows one step commands with increased time, Follows multi-step commands inconsistently Safety/Judgement: Decreased awareness of safety, Decreased awareness of deficits General Comments: patient reports increased confusion today voicing "I didn't know where I was this morning" but pt oriented during session, patient requires 1 step cueing, constant redirection to task as perseverating on "tightness" of gown on L side  Attention: Sustained Sustained Attention: Impaired Sustained Attention Impairment: Verbal basic, Functional basic Memory: Impaired Memory Impairment: Storage deficit, Decreased recall of new information Problem Solving: Impaired Problem Solving Impairment: Verbal basic, Functional basic Safety/Judgment: Impaired    Extremity Assessment (includes Sensation/Coordination)   Upper Extremity Assessment: RUE deficits/detail, LUE deficits/detail RUE Deficits /  Details: WFL  LUE Deficits / Details: apparent sensory/motor deficits, limb apraxia "alien arm" with poor proprioception, sensation, and coordination; pt hitting arm on bed rails multiple times LUE Sensation: decreased light touch, decreased proprioception LUE Coordination: decreased fine motor, decreased gross motor  Lower Extremity Assessment: Defer to PT evaluation LLE Deficits / Details: AAROM WFL; strength-hip flexion2+, knee extension 3-, ankle DF 3 LLE Sensation:  (absent to light touch, deep presure, pain) LLE Coordination: decreased gross motor (due to decr sensation and weakness)     ADLs   Overall ADL's : Needs assistance/impaired Grooming: Wash/dry hands, Wash/dry face, Moderate assistance, Sitting Grooming Details (indicate cue type and reason): able to wash face with min cueing for L side of face, max assist to wash hands  Upper Body Bathing: Sitting, Moderate assistance Lower Body Bathing: Total assistance, +2 for physical assistance, +2 for safety/equipment, Sit to/from stand Upper Body Dressing : Moderate assistance, Sitting Upper Body Dressing Details (indicate cue type and reason): to don new gown, poor awarness and control of L UE; seqencing of task  Lower Body Dressing: Total assistance, +2 for physical assistance, +2 for safety/equipment Lower Body Dressing Details (indicate cue type and reason): to don socks, sit to stand with mod assist +2 Toilet Transfer: Moderate assistance, +2 for safety/equipment, +2 for physical assistance Toilet Transfer Details (indicate cue type and reason): simulated to recliner  Functional mobility during ADLs: +2 for physical assistance, +2 for safety/equipment, Moderate assistance General ADL Comments: pt limited by cognition, L sided deficits  (sesnation, coordination, motor planning), balance       Mobility   Overal bed mobility: Needs Assistance Bed Mobility: Supine to Sit Rolling: Min guard Sidelying to sit:  Min assist Supine to  sit: Min assist Sit to supine: Min assist, +2 for safety/equipment General bed mobility comments: min assist for safety/balance, scooting foward; cueing for sequencing and technique using bed rails with increased time required     Transfers   Overall transfer level: Needs assistance Equipment used: 2 person hand held assist Transfers: Sit to/from Stand Sit to Stand: Mod assist, +2 physical assistance Stand pivot transfers: Mod assist, +2 physical assistance, +2 safety/equipment General transfer comment: mod assist +2 to power up and steady with cueing for positioning, technique, forward lean; pivoting towards R side with step by step cueing and tactile support to initate movement of R LE      Ambulation / Gait / Stairs / Wheelchair Mobility   Ambulation/Gait General Gait Details: unable; in standing able to lift rt foot off the floor (with stable LLE) however could not begin to lift lt foot off floor due to inability to weight shift over RLE     Posture / Balance Dynamic Sitting Balance Sitting balance - Comments: min guard statically for safety, slight R Lateral lean but much improved midline positioniing today  Balance Overall balance assessment: Needs assistance Sitting-balance support: No upper extremity supported, Feet supported Sitting balance-Leahy Scale: Poor Sitting balance - Comments: min guard statically for safety, slight R Lateral lean but much improved midline positioniing today  Postural control: Right lateral lean Standing balance support: Bilateral upper extremity supported Standing balance-Leahy Scale: Poor Standing balance comment: relies on BUE and external support, continues to have posterior lean but able to maintain with support     Special needs/care consideration   Visitors are daughters Mailey and Iuka Hgb A1c 5.7      Previous Home Environment  Living Arrangements: Spouse/significant other  Lives With: Spouse Available Help at Discharge: Family,  Available 24 hours/day (daughter Ritha and Shawna [place] will provide assist; spouse work) Type of Home: Mobile home Home Layout: One level Home Access: Stairs to enter Entrance Stairs-Rails: Left Entrance Stairs-Number of Steps: 3 Bathroom Shower/Tub: Engineer, manufacturing systems: Handicapped height Bathroom Accessibility: Yes Home Care Services: No Additional Comments: confirmed with daughter   Discharge Living Setting Plans for Discharge Living Setting: Patient's home, Mobile Home, Lives with (comment) (spouse) Type of Home at Discharge: Mobile home Discharge Home Layout: One level Discharge Home Access: Stairs to enter Entrance Stairs-Rails: Left Entrance Stairs-Number of Steps: 3 Discharge Bathroom Shower/Tub: Tub/shower unit Discharge Bathroom Toilet: Handicapped height Discharge Bathroom Accessibility: Yes How Accessible: Accessible via walker Does the patient have any problems obtaining your medications?: No   Social/Family/Support Systems Patient Roles: Spouse, Parent Contact Information: Kem Parkinson, daughter Anticipated Caregiver: daughters Anticipated Caregiver's Contact Information: see above Ability/Limitations of Caregiver: Joselyne off work for summer, Melanie lives next door but cares for handicapped spouse Caregiver Availability: 24/7 Discharge Plan Discussed with Primary Caregiver: Yes Is Caregiver In Agreement with Plan?: Yes Does Caregiver/Family have Issues with Lodging/Transportation while Pt is in Rehab?: No   Goals Patient/Family Goal for Rehab: Mod I to superivison with PT, OT, and SLP Expected length of stay: ELOS 10 to 14 days Pt/Family Agrees to Admission and willing to participate: Yes Program Orientation Provided & Reviewed with Pt/Caregiver Including Roles  & Responsibilities: Yes   Decrease burden of Care through IP rehab admission: n/a   Possible need for SNF placement upon discharge:not anticipated   Patient Condition: This patient's condition  remains as documented in the consult dated 10/03/2019, in which the Rehabilitation Physician determined and documented that the patient's  condition is appropriate for intensive rehabilitative care in an inpatient rehabilitation facility. Will admit to inpatient rehab today.   Preadmission Screen Completed By:  Cleatrice Burke, RN, 10/05/2019 10:59 AM ______________________________________________________________________   Discussed status with Dr. Dagoberto Ligas on 10/05/2019 at  6 and received approval for admission today.   Admission Coordinator:  Cleatrice Burke, time 3474 Date 10/05/2019             Cosigned by: Courtney Heys, MD at 10/05/2019 11:22 AM  Revision History                Note Details  Author Cristina Gong, RN File Time 10/05/2019 11:07 AM  Author Type Rehab Admission Coordinator Status Signed  Last Editor Cristina Gong, RN Service Physical Medicine and [place] # [phone] Admit Date 10/05/2019

## 2019-10-05 NOTE — TOC Transition Note (Signed)
Transition of Care [facility]) - CM/SW Discharge Note   Patient Details  Name: [name] Good MRN: [id number] Date of Birth: 1950-11-17  [facility] CM/SW Contact:  Kermit Balo, RN Phone Number: 10/05/2019, 10:47 AM   Clinical Narrative:    Pt is discharging to CIR today. CM signing off. Benefits check results for Eliquis in the notes.    Final next level of care: IP Rehab Facility Barriers to Discharge: No Barriers Identified   Patient Goals and CMS Choice Patient states their goals for this hospitalization and ongoing recovery are:: Pt states she is agreeable to SNF, before returning home. CMS Medicare.gov Compare Post Acute Care list provided to:: Patient Choice offered to / list presented to : Patient  Discharge Placement                       Discharge Plan and Services In-house Referral: Clinical Social Work   Post Acute Care Choice: Skilled Nursing Facility                               Social Determinants of Health (SDOH) Interventions     Readmission Risk Interventions No flowsheet data found.

## 2019-10-05 NOTE — H&P (Signed)
Physical Medicine and Rehabilitation Admission H&P    No chief complaint on file. : HPI: [name] Good is a 69 year old right-handed female with history of depression, CAD, lumbar spine fusion complicated by wound dehiscence 2017, RLS, quit smoking 6 years ago.  Per chart review lives with spouse independent with assistive device.  Mobile home 3 steps to entry.  Presented 10/01/2019 with left-sided weakness and slurred speech.  Cranial CT scan negative for acute changes.  Chronic right parieto-occipital infarction.  CT angiogram of head and neck negative for emergent large vessel occlusion.  MRI of the brain showed a 13 mm acute ischemic nonhemorrhagic right thalamic infarction.  Echocardiogram with ejection fraction of 55% grade 1 diastolic dysfunction as well as LV thrombus.  Admission chemistries with potassium 2.3, creatinine 1.08.  Initially maintained on aspirin and Plavix for CVA prophylaxis changed to Eliquis after findings of LV thrombus.  Plans to follow-up cardiology services Dr. Doylene Canard in regards to LV apical thrombus with repeat echocardiogram in 3 months.  Tolerating a regular diet.  Therapy evaluations completed and patient was admitted for a comprehensive rehab program.  Pt reports she thought her brain was doing better until couldn't remember if going to CIR today or not.  Denies pain; Voiding well- using Purewick- pt knows don't use them in CIR.  LBM last night- denies constipation.    Review of Systems  Constitutional: Negative for chills and fever.  HENT: Negative for hearing loss.   Eyes: Negative for blurred vision and double vision.  Respiratory: Negative for cough and shortness of breath.   Cardiovascular: Negative for chest pain, palpitations and leg swelling.  Gastrointestinal: Positive for constipation and nausea. Negative for heartburn and vomiting.       GERD  Genitourinary: Negative for dysuria, flank pain and hematuria.  Musculoskeletal: Positive for back  pain, joint pain and myalgias.  Skin: Negative for rash.  Psychiatric/Behavioral: Positive for depression. The patient has insomnia.   All other systems reviewed and are negative.  Past Medical History:  Diagnosis Date  . Anxiety   . Arthritis   . Depression   . GERD (gastroesophageal reflux disease)   . Myocardial infarction ([place]) 89   no visits to cardiac dr(thomas kelly) since 89  . Stroke ([place])   . Wound dehiscence    lumbar   Past Surgical History:  Procedure Laterality Date  . APPENDECTOMY     17 yrs  . BACK SURGERY    . BREAST SURGERY Left    cyst  . CARDIAC CATHETERIZATION  89  . CHOLECYSTECTOMY     69 yrs old  . LUMBAR LAMINECTOMY/DECOMPRESSION MICRODISCECTOMY N/A 06/14/2015   Procedure: Lumbar Three-Four,Lumbar Four-Five, Lumbar Five-Sacral One Laminectomy;  Surgeon: Eustace Moore, MD;  Location: [facility];  Service: Neurosurgery;  Laterality: N/A;  . LUMBAR WOUND DEBRIDEMENT N/A 07/24/2015   Procedure: lumbar wound revision;  Surgeon: Eustace Moore, MD;  Location: [facility];  Service: Neurosurgery;  Laterality: N/A;  . TOTAL HIP ARTHROPLASTY Right 10/25/2015   Procedure: RIGHT TOTAL HIP ARTHROPLASTY ANTERIOR APPROACH;  Surgeon: Mcarthur Rossetti, MD;  Location: [facility];  Service: Orthopedics;  Laterality: Right;  . TUBAL LIGATION     History reviewed. No pertinent family history. Social History:  reports that she quit smoking about 6 years ago. Her smoking use included cigarettes. She has a 80.00 pack-year smoking history. She has never used smokeless tobacco. She reports previous alcohol use. She reports that she does not use  drugs. Allergies:  Allergies  Allergen Reactions  . Amoxicillin Other (See Comments)    Tolerated Zosyn Has patient had a PCN reaction causing immediate rash, facial/tongue/throat swelling, SOB or lightheadedness with hypotension: No Has patient had a PCN reaction causing severe rash involving mucus membranes or skin necrosis:  No Has patient had a PCN reaction that required hospitalization No Has patient had a PCN reaction occurring within the last 10 years: No If all of the above answers are "NO", then may proceed with Cephalosporin use.   Medications Prior to Admission  Medication Sig Dispense Refill  . apixaban (ELIQUIS) 5 MG TABS tablet Take 2 tablets (10 mg total) by mouth 2 (two) times daily for 6 days. 24 tablet 0  . [START ON 10/12/2019] apixaban (ELIQUIS) 5 MG TABS tablet Take 1 tablet (5 mg total) by mouth 2 (two) times daily. 60 tablet   . [START ON 10/06/2019] atorvastatin (LIPITOR) 80 MG tablet Take 1 tablet (80 mg total) by mouth daily.    . calcium carbonate (TUMS EX) 750 MG chewable tablet Chew 1 tablet by mouth 2 (two) times daily as needed for heartburn.    . fluticasone (FLONASE) 50 MCG/ACT nasal spray Place 2 sprays into both nostrils daily as needed.    Marland Kitchen ibuprofen (ADVIL) 200 MG tablet Take 400-600 mg by mouth every 6 (six) hours as needed.    . magnesium oxide (MAG-OX) 400 MG tablet Take 1 tablet (400 mg total) by mouth daily.    . melatonin 3 MG TABS tablet Take 1 tablet (3 mg total) by mouth at bedtime.  0  . [START ON 10/06/2019] Multiple Vitamin (MULTIVITAMIN WITH MINERALS) TABS tablet Take 1 tablet by mouth daily.    Marland Kitchen oxyCODONE-acetaminophen (PERCOCET) 7.5-325 MG tablet Take 1 tablet by mouth 4 (four) times daily as needed for pain.    . phentermine (ADIPEX-P) 37.5 MG tablet Take 37.5 mg by mouth daily as needed (appetite).     . potassium chloride SA (KLOR-CON) 20 MEQ tablet Take 1 tablet (20 mEq total) by mouth daily for 7 days. 7 tablet 0  . Pseudoephedrine HCl (SUDAFED PO) Take 1 tablet by mouth daily as needed (Allergies).    Marland Kitchen rOPINIRole (REQUIP) 3 MG tablet Take 3 mg by mouth at bedtime as needed (restless leg).     Marland Kitchen tetrahydrozoline 0.05 % ophthalmic solution Place 1 drop into both eyes daily as needed (Dry eyes).      Drug Regimen Review Drug regimen was reviewed and remains  appropriate with no significant issues identified  Home: Home Living Family/patient expects to be discharged to:: Private residence Living Arrangements: Spouse/significant other   Functional History:    Functional Status:  Mobility:          ADL:    Cognition:      Physical Exam: Blood pressure 119/66, pulse 87, temperature 98.3 F (36.8 C), temperature source Oral, resp. rate 16, SpO2 99 %. Physical Exam  Nursing note and vitals reviewed. Constitutional: She does not appear ill. No distress.  Pt sitting up in bed; alone- appropriate, slightly/pleasantly confused about higher level issues, NAD  HENT:  Head: Normocephalic and atraumatic.  Right Ear: External ear normal.  Left Ear: External ear normal.  Nose: Nose normal.  Mouth/Throat: Mucous membranes are moist. No oropharyngeal exudate. Oropharynx is clear.  Eyes: Pupils are equal, round, and reactive to light. Conjunctivae are normal. Right eye exhibits no discharge. Left eye exhibits no discharge.  Cardiovascular: Normal rate, regular rhythm, normal  heart sounds and normal pulses. Exam reveals no gallop and no friction rub.  No murmur heard. Respiratory: Effort normal and breath sounds normal. No stridor. No respiratory distress. She has no wheezes. She has no rhonchi. She has no rales.  GI: Soft. Normal appearance and bowel sounds are normal. She exhibits no distension. There is no abdominal tenderness. There is no rebound and no guarding.  Musculoskeletal:     Comments: RUE is same 5/5 in all muscles tested D/B/T/WE, G and FA LUE- 4/5 in same muscles- no change RLE- 5/5 in HF, KE, KF, DF and PF- no change LLE- 4/5 in same muscles- no change  Neurological: She is alert.  Patient is alert sitting up in bed.  She displays a right gaze preference and left neglect.  She does provide her name and age but very limited medical historian needed some cues for appropriate year and dates.  L neglect is slightly improved-  left side sensation still decreased, but better per pt.  Unable to look Fully L, but better than 2 days ago.  Slowed processing, but better than when so sedated 2 days ago  Skin: Skin is warm and dry.  2 IVs R hand and wrist- look good- no infiltration No skin breakdown seen  Psychiatric: Mood normal.    Results for orders placed or performed during the hospital encounter of 10/01/19 (from the past 48 hour(s))  Basic metabolic panel     Status: Abnormal   Collection Time: 10/04/19  8:04 AM  Result Value Ref Range   Sodium 138 135 - 145 mmol/L   Potassium 4.1 3.5 - 5.1 mmol/L   Chloride 105 98 - 111 mmol/L   CO2 24 22 - 32 mmol/L   Glucose, Bld 114 (H) 70 - 99 mg/dL    Comment: Glucose reference range applies only to samples taken after fasting for at least 8 hours.   BUN 8 8 - 23 mg/dL   Creatinine, Ser 6.06 0.44 - 1.00 mg/dL   Calcium 9.0 8.9 - 30.1 mg/dL   GFR calc non Af Amer >60 >60 mL/min   GFR calc Af Amer >60 >60 mL/min   Anion gap 9 5 - 15    Comment: Performed at [facility], [address], [place], [place] [address]  Magnesium     Status: None   Collection Time: 10/04/19  8:04 AM  Result Value Ref Range   Magnesium 1.8 1.7 - 2.4 mg/dL    Comment: Performed at [facility], [address] [place], [place] [address]  Phosphorus     Status: Abnormal   Collection Time: 10/04/19  8:04 AM  Result Value Ref Range   Phosphorus 2.4 (L) 2.5 - 4.6 mg/dL    Comment: Performed at [facility], [address] [place], [place] [address]   No results found.     Medical Problem List and Plan: 1.  Left side weakness secondary to right thalamic infarction  -patient may  shower  -ELOS/Goals: 2-2.5 weeks- goals supervision 2.  Antithrombotics: -DVT/anticoagulation: Eliquis  -antiplatelet therapy: N/A 3. Pain Management: Oxycodone and Robaxin as needed- has been c/o her feet hurting intermittently 4. Mood: Melatonin 3 mg nightly  -antipsychotic agents:  N/A 5. Neuropsych: This patient is capable of making decisions on her own behalf. 6. Skin/Wound Care: Routine skin checks 7. Fluids/Electrolytes/Nutrition: Routine in and outs with follow-up chemistries 8.  LV apical thrombus.  Continue Eliquis as directed.  Follow-up outpatient Dr. Algie Coffer  cardiology services plan repeat echocardiogram 3 months 9.  Hyperlipidemia.  Lipitor 10.  Restless leg syndrome.  Requip as needed- has significant Sx's-  11.  History of lumbar decompression complicated by wound dehiscence 2017.  Patient used assistive device prior to admission. 12. Hypokalemia- improved to 4.1- was 2.3 on admission with significant dehydration that has resolved.     Mcarthur Rossetti. Anguilli, PA-C 10/05/2019   I have personally performed a face to face diagnostic evaluation of this patient and formulated the key components of the plan.  Additionally, I have personally reviewed laboratory data, imaging studies, as well as relevant notes and concur with the physician assistant's documentation above.   The patient's status has not changed from the original H&P.  Any changes in documentation from the acute care chart have been noted above.     Genice Rouge, MD 10/05/2019

## 2019-10-05 NOTE — Discharge Summary (Signed)
Physician Discharge Summary  [name] Good:[id number] DOB: 1950/05/13 DOA: 10/01/2019  PCP: Kaleen Mask, MD  Admit date: 10/01/2019 Discharge date: 10/05/2019  Admitted From: Home Disposition: Acute inpatient rehab  Recommendations for Outpatient Follow-up:  1. Follow up with PCP in 1-2 weeks 2. Please obtain BMP/CBC/magnesium in one week 3. Schedule neurology follow-up on discharge  Discharge Condition: Stable CODE STATUS: Full code Diet recommendation: Low-salt low-cholesterol diet  Discharge summary: 69 year old female with history of coronary artery disease, depression, GERD, restless leg syndrome presented to the emergency room as a code stroke for acute left-sided weakness.  Found at home.  She was on the floor, found with slurred speech and left-sided weakness.  Outside TPA window on arrival.  CT head negative for acute CVA.  CTA of the head and neck was negative for large vessel occlusion.  Potassium was 2.3.  Treated for acute stroke.  Transferring to acute inpatient rehab today.  # Acute ischemic stroke, right MCA territory stroke: Clinical findings, slurred speech, left-sided weakness. CT head findings, initial CT scan negative. MRI of the brain, right thalamic stroke.  Old right temporal occipital cortical infarct.  Small vessel disease. CTA of the head and neck, no large vessel occlusion.  Proximal right ICA 50% stenosis.  Distal right ACA severe stenosis. 2D echocardiogram, hypokinetic aneurysmal segment consistent with thrombus on the left ventricular apex. Antiplatelet therapy, was taking aspirin at home.  Now started on Eliquis. LDL, 118, started on Lipitor. Hemoglobin A1c, 5.7.  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  Recommended anticoagulation with high-dose Eliquis followed by Eliquis 5 mg twice a day.  Repeat echocardiogram in 3 months and 6 months.  #  Severe hypokalemia: Due to poor intake from recent grief.  Replaced adequately with improvement.  Magnesium normal. We will keep on a schedule potassium and magnesium.  Please recheck in 1 week.  # Acute kidney injury: Improved.  # Restless leg syndrome/chronic leg pain: On Requip and occasionally on oxycodone that she will continue.  Patient is medically stable to transfer to acute inpatient rehab to continue multidisciplinary rehab and continue with medical supervision and treatment.  Discharge Diagnoses:  Principal Problem:   Acute CVA (cerebrovascular accident) [facility]) Active Problems:   Hypokalemia   Severe dehydration   Grief reaction   RLS (restless legs syndrome)    Discharge Instructions  Discharge Instructions    Diet - low sodium heart healthy   Complete by: As directed    Increase activity slowly   Complete by: As directed      Allergies as of 10/05/2019      Reactions   Amoxicillin Other (See Comments)   Tolerated Zosyn Has patient had a PCN reaction causing immediate rash, facial/tongue/throat swelling, SOB or lightheadedness with hypotension: No Has patient had a PCN reaction causing severe rash involving mucus membranes or skin necrosis: No Has patient had a PCN reaction that required hospitalization No Has patient had a PCN reaction occurring within the last 10 years: No If all of the above answers are "NO", then may proceed with Cephalosporin use.      Medication List    STOP taking these medications   aspirin 325 MG EC tablet   doxycycline 100 MG capsule Commonly known as: VIBRAMYCIN   methocarbamol 500 MG tablet Commonly known as: ROBAXIN   methylPREDNISolone 4 MG tablet Commonly known as: Medrol   predniSONE 5 MG tablet Commonly known as:  DELTASONE     TAKE these medications   apixaban 5 MG Tabs tablet Commonly known as: ELIQUIS Take 2 tablets (10 mg total) by mouth 2 (two) times daily for 6 days.   apixaban 5 MG Tabs tablet Commonly  known as: ELIQUIS Take 1 tablet (5 mg total) by mouth 2 (two) times daily. Start taking on: October 12, 2019   atorvastatin 80 MG tablet Commonly known as: LIPITOR Take 1 tablet (80 mg total) by mouth daily. Start taking on: October 06, 2019   calcium carbonate 750 MG chewable tablet Commonly known as: TUMS EX Chew 1 tablet by mouth 2 (two) times daily as needed for heartburn.   fluticasone 50 MCG/ACT nasal spray Commonly known as: FLONASE Place 2 sprays into both nostrils daily as needed.   ibuprofen 200 MG tablet Commonly known as: ADVIL Take 400-600 mg by mouth every 6 (six) hours as needed.   magnesium oxide 400 MG tablet Commonly known as: MAG-OX Take 1 tablet (400 mg total) by mouth daily.   melatonin 3 MG Tabs tablet Take 1 tablet (3 mg total) by mouth at bedtime.   multivitamin with minerals Tabs tablet Take 1 tablet by mouth daily. Start taking on: October 06, 2019   oxyCODONE-acetaminophen 7.5-325 MG tablet Commonly known as: PERCOCET Take 1 tablet by mouth 4 (four) times daily as needed for pain.   phentermine 37.5 MG tablet Commonly known as: ADIPEX-P Take 37.5 mg by mouth daily as needed (appetite).   potassium chloride SA 20 MEQ tablet Commonly known as: KLOR-CON Take 1 tablet (20 mEq total) by mouth daily for 7 days.   rOPINIRole 3 MG tablet Commonly known as: REQUIP Take 3 mg by mouth at bedtime as needed (restless leg).   SUDAFED PO Take 1 tablet by mouth daily as needed (Allergies).   tetrahydrozoline 0.05 % ophthalmic solution Place 1 drop into both eyes daily as needed (Dry eyes).       Follow-up Information    Orpah Cobb, MD. Schedule an appointment as soon as possible for a visit in 1 month(s).   Specialty: Cardiology Contact information: [address] [place] [address] [phone]              Allergies  Allergen Reactions  . Amoxicillin Other (See Comments)    Tolerated Zosyn Has patient had a PCN reaction  causing immediate rash, facial/tongue/throat swelling, SOB or lightheadedness with hypotension: No Has patient had a PCN reaction causing severe rash involving mucus membranes or skin necrosis: No Has patient had a PCN reaction that required hospitalization No Has patient had a PCN reaction occurring within the last 10 years: No If all of the above answers are "NO", then may proceed with Cephalosporin use.    Consultations:  Neurology, cardiology   Procedures/Studies: CT Code Stroke CTA Head W/WO contrast  Result Date: 10/01/2019 CLINICAL DATA:  Initial evaluation for acute stroke, left-sided weakness, altered mental status. EXAM: CT ANGIOGRAPHY HEAD AND NECK CT PERFUSION BRAIN TECHNIQUE: Multidetector CT imaging of the head and neck was performed using the standard protocol during bolus administration of intravenous contrast. Multiplanar CT image reconstructions and MIPs were obtained to evaluate the vascular anatomy. Carotid stenosis measurements (when applicable) are obtained utilizing NASCET criteria, using the distal internal carotid diameter as the denominator. Multiphase CT imaging of the brain was performed following IV bolus contrast injection. Subsequent parametric perfusion maps were calculated using RAPID software. CONTRAST:  OMNIPAQUE IOHEXOL 350 MG/ML SOLN COMPARISON:  Comparison made  with prior noncontrast head CT from earlier same day. FINDINGS: CTA NECK FINDINGS Aortic arch: Visualized aortic arch of normal caliber with normal branch pattern. Moderate atheromatous plaque seen about the arch and origin of the great vessels without hemodynamically significant stenosis. Note made of soft plaque protruding into the lumen of the distal arch/proximal intrathoracic aorta (series 5, image 307). Visualized subclavian arteries widely patent. Right carotid system: Right common carotid artery patent from its origin to the bifurcation without flow-limiting stenosis. Scattered eccentric plaque  about the right bifurcation without significant stenosis. Just distally within the proximal right ICA, additional focal plaque is seen with associated short-segment stenosis of up to 50% by NASCET criteria (series 5, image 169). Right ICA tortuous and medialized into the retropharyngeal space. Right ICA otherwise widely patent distally to the skull base without stenosis, dissection or occlusion. Left carotid system: Left common carotid artery patent from its origin to the bifurcation without stenosis. Scattered eccentric mixed plaque about the left bifurcation/proximal left ICA without hemodynamically significant stenosis. Proximal left ICA tortuous and medialized into the retropharyngeal space. Left ICA widely patent distally to the skull base without stenosis, dissection or occlusion. Vertebral arteries: Both vertebral arteries arise from the subclavian arteries. Strongly dominant right vertebral artery with a markedly hypoplastic left vertebral artery noted. Dominant right vertebral artery patent within the neck without stenosis, dissection, or occlusion. Markedly diminutive left vertebral artery patent as well without appreciable stenosis or other vascular abnormality. Skeleton: Alignment: Straightening of the normal cervical lordosis. No listhesis or malalignment. Skull base and vertebrae: Skull base intact. Normal C1-2 articulations are preserved in the dens is intact. Vertebral body heights maintained. No acute fracture. Soft tissues and spinal canal: Soft tissues of the neck demonstrate no acute finding. No abnormal prevertebral edema. Spinal canal within normal limits. Disc levels: Mild multilevel cervical spondylosis without significant stenosis, most pronounced at C6-7. Upper chest: Visualized upper chest demonstrates no acute finding. Partially visualized lung apices are clear. Other: None. Other neck: No other acute soft tissue abnormality within the neck. No mass lesion or adenopathy. Subcentimeter  calcification noted within the left lobe of thyroid. Upper chest: Visualized upper chest demonstrates no acute finding. Partially visualized lungs are grossly clear. Review of the MIP images confirms the above findings CTA HEAD FINDINGS Anterior circulation: Petrous segments widely patent bilaterally. Scattered atheromatous plaque throughout the carotid siphons with relatively mild multifocal stenosis. A1 segments patent bilaterally. Normal anterior communicating artery complex. Anterior cerebral arteries widely patent proximally. Short-segment severe distal right ACA stenosis noted (series 5, image 62). ACAs otherwise widely patent to their distal aspects. No M1 stenosis or occlusion. Normal MCA bifurcations. Distal MCA branches well perfused and symmetric. Posterior circulation: Dominant right vertebral artery patent to the vertebrobasilar junction without stenosis. Right PICA not visualized. Hypoplastic left vertebral artery patent to the vertebrobasilar junction as well. Left PICA patent. Basilar patent to its distal aspect without stenosis. Superior cerebral arteries patent bilaterally. Left PCA supplied via the basilar. Right PCA supplied via a hypoplastic right P1 segment and robust right posterior communicating artery. Both PCAs well perfused to their distal aspects without stenosis. Venous sinuses: Patent. Anatomic variants: Predominant fetal type origin of the right PCA. No intracranial aneurysm. Review of the MIP images confirms the above findings CT Brain Perfusion Findings: ASPECTS: 10 CBF (<30%) Volume: 0mL Perfusion (Tmax>6.0s) volume: 0mL Mismatch Volume: 0mL Infarction Location:Negative CT perfusion for acute core infarct or other perfusion deficit. IMPRESSION: CTA HEAD AND NECK IMPRESSION: 1. Negative CTA for emergent  large vessel occlusion. 2. 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 3. Single short-segment severe  distal right ACA stenosis as above. 4. Additional moderate atheromatous disease about the major arterial vasculature of the head and neck as above. No other hemodynamically significant or correctable stenosis. CT PERFUSION IMPRESSION: Negative CT perfusion. No evidence for acute core infarct or other perfusion deficit. CT CERVICAL SPINE IMPRESSION: No acute traumatic injury within the cervical spine. These results were communicated to Dr. Wilford Corner at approximately 9:00 pmon 6/6/2021by text page via the [facility] messaging system. Electronically Signed   By: Rise Mu M.D.   On: 10/01/2019 21:50   CT Code Stroke CTA Neck W/WO contrast  Result Date: 10/01/2019 CLINICAL DATA:  Initial evaluation for acute stroke, left-sided weakness, altered mental status. EXAM: CT ANGIOGRAPHY HEAD AND NECK CT PERFUSION BRAIN TECHNIQUE: Multidetector CT imaging of the head and neck was performed using the standard protocol during bolus administration of intravenous contrast. Multiplanar CT image reconstructions and MIPs were obtained to evaluate the vascular anatomy. Carotid stenosis measurements (when applicable) are obtained utilizing NASCET criteria, using the distal internal carotid diameter as the denominator. Multiphase CT imaging of the brain was performed following IV bolus contrast injection. Subsequent parametric perfusion maps were calculated using RAPID software. CONTRAST:  OMNIPAQUE IOHEXOL 350 MG/ML SOLN COMPARISON:  Comparison made with prior noncontrast head CT from earlier same day. FINDINGS: CTA NECK FINDINGS Aortic arch: Visualized aortic arch of normal caliber with normal branch pattern. Moderate atheromatous plaque seen about the arch and origin of the great vessels without hemodynamically significant stenosis. Note made of soft plaque protruding into the lumen of the distal arch/proximal intrathoracic aorta (series 5, image 307). Visualized subclavian arteries widely patent. Right carotid system:  Right common carotid artery patent from its origin to the bifurcation without flow-limiting stenosis. Scattered eccentric plaque about the right bifurcation without significant stenosis. Just distally within the proximal right ICA, additional focal plaque is seen with associated short-segment stenosis of up to 50% by NASCET criteria (series 5, image 169). Right ICA tortuous and medialized into the retropharyngeal space. Right ICA otherwise widely patent distally to the skull base without stenosis, dissection or occlusion. Left carotid system: Left common carotid artery patent from its origin to the bifurcation without stenosis. Scattered eccentric mixed plaque about the left bifurcation/proximal left ICA without hemodynamically significant stenosis. Proximal left ICA tortuous and medialized into the retropharyngeal space. Left ICA widely patent distally to the skull base without stenosis, dissection or occlusion. Vertebral arteries: Both vertebral arteries arise from the subclavian arteries. Strongly dominant right vertebral artery with a markedly hypoplastic left vertebral artery noted. Dominant right vertebral artery patent within the neck without stenosis, dissection, or occlusion. Markedly diminutive left vertebral artery patent as well without appreciable stenosis or other vascular abnormality. Skeleton: Alignment: Straightening of the normal cervical lordosis. No listhesis or malalignment. Skull base and vertebrae: Skull base intact. Normal C1-2 articulations are preserved in the dens is intact. Vertebral body heights maintained. No acute fracture. Soft tissues and spinal canal: Soft tissues of the neck demonstrate no acute finding. No abnormal prevertebral edema. Spinal canal within normal limits. Disc levels: Mild multilevel cervical spondylosis without significant stenosis, most pronounced at C6-7. Upper chest: Visualized upper chest demonstrates no acute finding. Partially visualized lung apices are clear.  Other: None. Other neck: No other acute soft tissue abnormality within the neck. No mass lesion or adenopathy. Subcentimeter calcification noted within  the left lobe of thyroid. Upper chest: Visualized upper chest demonstrates no acute finding. Partially visualized lungs are grossly clear. Review of the MIP images confirms the above findings CTA HEAD FINDINGS Anterior circulation: Petrous segments widely patent bilaterally. Scattered atheromatous plaque throughout the carotid siphons with relatively mild multifocal stenosis. A1 segments patent bilaterally. Normal anterior communicating artery complex. Anterior cerebral arteries widely patent proximally. Short-segment severe distal right ACA stenosis noted (series 5, image 62). ACAs otherwise widely patent to their distal aspects. No M1 stenosis or occlusion. Normal MCA bifurcations. Distal MCA branches well perfused and symmetric. Posterior circulation: Dominant right vertebral artery patent to the vertebrobasilar junction without stenosis. Right PICA not visualized. Hypoplastic left vertebral artery patent to the vertebrobasilar junction as well. Left PICA patent. Basilar patent to its distal aspect without stenosis. Superior cerebral arteries patent bilaterally. Left PCA supplied via the basilar. Right PCA supplied via a hypoplastic right P1 segment and robust right posterior communicating artery. Both PCAs well perfused to their distal aspects without stenosis. Venous sinuses: Patent. Anatomic variants: Predominant fetal type origin of the right PCA. No intracranial aneurysm. Review of the MIP images confirms the above findings CT Brain Perfusion Findings: ASPECTS: 10 CBF (<30%) Volume: 0mL Perfusion (Tmax>6.0s) volume: 0mL Mismatch Volume: 0mL Infarction Location:Negative CT perfusion for acute core infarct or other perfusion deficit. IMPRESSION: CTA HEAD AND NECK IMPRESSION: 1. Negative CTA for emergent large vessel occlusion. 2. Atheromatous plaque about the  proximal right ICA with associated short-segment stenosis of up to 50% by NASCET criteria. 3. Single short-segment severe distal right ACA stenosis as above. 4. Additional moderate atheromatous disease about the major arterial vasculature of the head and neck as above. No other hemodynamically significant or correctable stenosis. CT PERFUSION IMPRESSION: Negative CT perfusion. No evidence for acute core infarct or other perfusion deficit. CT CERVICAL SPINE IMPRESSION: No acute traumatic injury within the cervical spine. These results were communicated to Dr. Wilford Corner at approximately 9:00 pmon 6/6/2021by text page via the [facility] messaging system. Electronically Signed   By: Rise Mu M.D.   On: 10/01/2019 21:50   MR BRAIN WO CONTRAST  Result Date: 10/02/2019 CLINICAL DATA:  Follow-up examination for acute stroke. EXAM: MRI HEAD WITHOUT CONTRAST TECHNIQUE: Multiplanar, multiecho pulse sequences of the brain and surrounding structures were obtained without intravenous contrast. COMPARISON:  Comparison made with prior CTs from 10/01/2019. FINDINGS: Brain: Diffuse prominence of the CSF containing spaces compatible with generalized age-related cerebral atrophy. Patchy and confluent T2/FLAIR hyperintensity within the periventricular deep white matter both cerebral hemispheres most consistent with chronic small vessel ischemic disease. Patchy involvement of the deep gray nuclei and pons. Overall, appearance is moderate to advanced in nature. Superimposed remote cortical infarct at the right temporal occipital region. 13 mm focus of diffusion abnormality at the dorsal right thalamus compatible with an acute ischemic infarct (series 5, image 76). Associated signal loss on corresponding ADC map (series 6, image 25). No associated hemorrhage or mass effect. No other evidence for acute or subacute ischemia. Gray-white matter differentiation otherwise maintained. No other areas of remote cortical infarction. No  evidence for acute intracranial hemorrhage. No mass lesion, midline shift or mass effect. No hydrocephalus or extra-axial fluid collection. No made of a partially empty sella. Midline structures intact. Vascular: Major intracranial vascular flow voids are maintained. Skull and upper cervical spine: Craniocervical junction within normal limits. Bone marrow signal intensity normal. No scalp soft tissue abnormality. Sinuses/Orbits: Patient status post bilateral ocular lens replacement. Globes and orbital soft tissues  demonstrate no acute finding. Scattered mucosal thickening noted throughout the paranasal sinuses with superimposed left maxillary sinus retention cyst. Trace left mastoid effusion noted, of doubtful significance. Inner ear structures grossly normal. Other: None. IMPRESSION: 1. 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 2. 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 3. Underlying age-related cerebral atrophy with moderate to advanced chronic microvascular ischemic disease. Electronically Signed   By: Jeannine Boga M.D.   On: 10/02/2019 02:39   CT C-SPINE NO CHARGE  Result Date: 10/01/2019 CLINICAL DATA:  Initial evaluation for acute stroke, left-sided weakness, altered mental status. EXAM: CT ANGIOGRAPHY HEAD AND NECK CT PERFUSION BRAIN TECHNIQUE: Multidetector CT imaging of the head and neck was performed using the standard protocol during bolus administration of intravenous contrast. Multiplanar CT image reconstructions and MIPs were obtained to evaluate the vascular anatomy. Carotid stenosis measurements (when applicable) are obtained utilizing NASCET criteria, using the distal internal carotid diameter as the denominator. Multiphase CT imaging of the brain was performed following IV bolus contrast injection. Subsequent parametric perfusion maps were calculated using RAPID software. CONTRAST:  110mL OMNIPAQUE IOHEXOL 350 MG/ML SOLN COMPARISON:   Comparison made with prior noncontrast head CT from earlier same day. FINDINGS: CTA NECK FINDINGS Aortic arch: Visualized aortic arch of normal caliber with normal branch pattern. Moderate atheromatous plaque seen about the arch and origin of the great vessels without hemodynamically significant stenosis. Note made of soft plaque protruding into the lumen of the distal arch/proximal intrathoracic aorta (series 5, image 307). Visualized subclavian arteries widely patent. Right carotid system: Right common carotid artery patent from its origin to the bifurcation without flow-limiting stenosis. Scattered eccentric plaque about the right bifurcation without significant stenosis. Just distally within the proximal right ICA, additional focal plaque is seen with associated short-segment stenosis of up to 50% by NASCET criteria (series 5, image 169). Right ICA tortuous and medialized into the retropharyngeal space. Right ICA otherwise widely patent distally to the skull base without stenosis, dissection or occlusion. Left carotid system: Left common carotid artery patent from its origin to the bifurcation without stenosis. Scattered eccentric mixed plaque about the left bifurcation/proximal left ICA without hemodynamically significant stenosis. Proximal left ICA tortuous and medialized into the retropharyngeal space. Left ICA widely patent distally to the skull base without stenosis, dissection or occlusion. Vertebral arteries: Both vertebral arteries arise from the subclavian arteries. Strongly dominant right vertebral artery with a markedly hypoplastic left vertebral artery noted. Dominant right vertebral artery patent within the neck without stenosis, dissection, or occlusion. Markedly diminutive left vertebral artery patent as well without appreciable stenosis or other vascular abnormality. Skeleton: Alignment: Straightening of the normal cervical lordosis. No listhesis or malalignment. Skull base and vertebrae: Skull  base intact. Normal C1-2 articulations are preserved in the dens is intact. Vertebral body heights maintained. No acute fracture. Soft tissues and spinal canal: Soft tissues of the neck demonstrate no acute finding. No abnormal prevertebral edema. Spinal canal within normal limits. Disc levels: Mild multilevel cervical spondylosis without significant stenosis, most pronounced at C6-7. Upper chest: Visualized upper chest demonstrates no acute finding. Partially visualized lung apices are clear. Other: None. Other neck: No other acute soft tissue abnormality within the neck. No mass lesion or adenopathy. Subcentimeter calcification noted within the left lobe of thyroid. Upper chest: Visualized upper chest demonstrates no acute finding. Partially visualized lungs are grossly clear. Review of the MIP images confirms the above findings CTA HEAD FINDINGS Anterior circulation: Petrous segments widely patent bilaterally. Scattered  atheromatous plaque throughout the carotid siphons with relatively mild multifocal stenosis. A1 segments patent bilaterally. Normal anterior communicating artery complex. Anterior cerebral arteries widely patent proximally. Short-segment severe distal right ACA stenosis noted (series 5, image 62). ACAs otherwise widely patent to their distal aspects. No M1 stenosis or occlusion. Normal MCA bifurcations. Distal MCA branches well perfused and symmetric. Posterior circulation: Dominant right vertebral artery patent to the vertebrobasilar junction without stenosis. Right PICA not visualized. Hypoplastic left vertebral artery patent to the vertebrobasilar junction as well. Left PICA patent. Basilar patent to its distal aspect without stenosis. Superior cerebral arteries patent bilaterally. Left PCA supplied via the basilar. Right PCA supplied via a hypoplastic right P1 segment and robust right posterior communicating artery. Both PCAs well perfused to their distal aspects without stenosis. Venous  sinuses: Patent. Anatomic variants: Predominant fetal type origin of the right PCA. No intracranial aneurysm. Review of the MIP images confirms the above findings CT Brain Perfusion Findings: ASPECTS: 10 CBF (<30%) Volume: 46mL Perfusion (Tmax>6.0s) volume: 63mL Mismatch Volume: 11mL Infarction Location:Negative CT perfusion for acute core infarct or other perfusion deficit. IMPRESSION: CTA HEAD AND NECK IMPRESSION: 1. Negative CTA for emergent large vessel occlusion. 2. 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 3. Single short-segment severe distal right ACA stenosis as above. 4. Additional moderate atheromatous disease about the major arterial vasculature of the head and neck as above. No other hemodynamically significant or correctable stenosis. CT PERFUSION IMPRESSION: Negative CT perfusion. No evidence for acute core infarct or other perfusion deficit. CT CERVICAL SPINE IMPRESSION: No acute traumatic injury within the cervical spine. These results were communicated to Dr. Wilford Corner at approximately 9:00 pmon 6/6/2021by text page via the [facility] messaging system. Electronically Signed   By: Rise Mu M.D.   On: 10/01/2019 21:50   CT Code Stroke Cerebral Perfusion with contrast  Result Date: 10/01/2019 CLINICAL DATA:  Initial evaluation for acute stroke, left-sided weakness, altered mental status. EXAM: CT ANGIOGRAPHY HEAD AND NECK CT PERFUSION BRAIN TECHNIQUE: Multidetector CT imaging of the head and neck was performed using the standard protocol during bolus administration of intravenous contrast. Multiplanar CT image reconstructions and MIPs were obtained to evaluate the vascular anatomy. Carotid stenosis measurements (when applicable) are obtained utilizing NASCET criteria, using the distal internal carotid diameter as the denominator. Multiphase CT imaging of the brain was performed following IV bolus contrast injection.  Subsequent parametric perfusion maps were calculated using RAPID software. CONTRAST:  OMNIPAQUE IOHEXOL 350 MG/ML SOLN COMPARISON:  Comparison made with prior noncontrast head CT from earlier same day. FINDINGS: CTA NECK FINDINGS Aortic arch: Visualized aortic arch of normal caliber with normal branch pattern. Moderate atheromatous plaque seen about the arch and origin of the great vessels without hemodynamically significant stenosis. Note made of soft plaque protruding into the lumen of the distal arch/proximal intrathoracic aorta (series 5, image 307). Visualized subclavian arteries widely patent. Right carotid system: Right common carotid artery patent from its origin to the bifurcation without flow-limiting stenosis. Scattered eccentric plaque about the right bifurcation without significant stenosis. Just distally within the proximal right ICA, additional focal plaque is seen with associated short-segment stenosis of up to 50% by NASCET criteria (series 5, image 169). Right ICA tortuous and medialized into the retropharyngeal space. Right ICA otherwise widely patent distally to the skull base without stenosis, dissection or occlusion. Left carotid system: Left common carotid artery patent from its origin to the bifurcation without  stenosis. Scattered eccentric mixed plaque about the left bifurcation/proximal left ICA without hemodynamically significant stenosis. Proximal left ICA tortuous and medialized into the retropharyngeal space. Left ICA widely patent distally to the skull base without stenosis, dissection or occlusion. Vertebral arteries: Both vertebral arteries arise from the subclavian arteries. Strongly dominant right vertebral artery with a markedly hypoplastic left vertebral artery noted. Dominant right vertebral artery patent within the neck without stenosis, dissection, or occlusion. Markedly diminutive left vertebral artery patent as well without appreciable stenosis or other vascular  abnormality. Skeleton: Alignment: Straightening of the normal cervical lordosis. No listhesis or malalignment. Skull base and vertebrae: Skull base intact. Normal C1-2 articulations are preserved in the dens is intact. Vertebral body heights maintained. No acute fracture. Soft tissues and spinal canal: Soft tissues of the neck demonstrate no acute finding. No abnormal prevertebral edema. Spinal canal within normal limits. Disc levels: Mild multilevel cervical spondylosis without significant stenosis, most pronounced at C6-7. Upper chest: Visualized upper chest demonstrates no acute finding. Partially visualized lung apices are clear. Other: None. Other neck: No other acute soft tissue abnormality within the neck. No mass lesion or adenopathy. Subcentimeter calcification noted within the left lobe of thyroid. Upper chest: Visualized upper chest demonstrates no acute finding. Partially visualized lungs are grossly clear. Review of the MIP images confirms the above findings CTA HEAD FINDINGS Anterior circulation: Petrous segments widely patent bilaterally. Scattered atheromatous plaque throughout the carotid siphons with relatively mild multifocal stenosis. A1 segments patent bilaterally. Normal anterior communicating artery complex. Anterior cerebral arteries widely patent proximally. Short-segment severe distal right ACA stenosis noted (series 5, image 62). ACAs otherwise widely patent to their distal aspects. No M1 stenosis or occlusion. Normal MCA bifurcations. Distal MCA branches well perfused and symmetric. Posterior circulation: Dominant right vertebral artery patent to the vertebrobasilar junction without stenosis. Right PICA not visualized. Hypoplastic left vertebral artery patent to the vertebrobasilar junction as well. Left PICA patent. Basilar patent to its distal aspect without stenosis. Superior cerebral arteries patent bilaterally. Left PCA supplied via the basilar. Right PCA supplied via a hypoplastic  right P1 segment and robust right posterior communicating artery. Both PCAs well perfused to their distal aspects without stenosis. Venous sinuses: Patent. Anatomic variants: Predominant fetal type origin of the right PCA. No intracranial aneurysm. Review of the MIP images confirms the above findings CT Brain Perfusion Findings: ASPECTS: 10 CBF (<30%) Volume: 0mL Perfusion (Tmax>6.0s) volume: 0mL Mismatch Volume: 0mL Infarction Location:Negative CT perfusion for acute core infarct or other perfusion deficit. IMPRESSION: CTA HEAD AND NECK IMPRESSION: 1. Negative CTA for emergent large vessel occlusion. 2. 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 3. Single short-segment severe distal right ACA stenosis as above. 4. Additional moderate atheromatous disease about the major arterial vasculature of the head and neck as above. No other hemodynamically significant or correctable stenosis. CT PERFUSION IMPRESSION: Negative CT perfusion. No evidence for acute core infarct or other perfusion deficit. CT CERVICAL SPINE IMPRESSION: No acute traumatic injury within the cervical spine. These results were communicated to Dr. Wilford CornerArora at approximately 9:00 pmon 6/6/2021by text page via the [facility] messaging system. Electronically Signed   By: Rise MuBenjamin  McClintock M.D.   On: 10/01/2019 21:50   ECHOCARDIOGRAM COMPLETE  Result Date: 10/02/2019    ECHOCARDIOGRAM REPORT   Patient Name:   Octaviano GlowBRENDA J Boesel Date of Exam: 10/02/2019 Medical Rec #:  [id number]      Height:  61.0 in Accession #:    [id number]     Weight:       233.2 lb Date of Birth:  12/04/1950      BSA:          2.016 m Patient Age:    69 years       BP:           123/72 mmHg Patient Gender: F              HR:           90 bpm. Exam Location:  Inpatient Procedure: 2D Echo, Color Doppler, Cardiac Doppler and Intracardiac            Opacification Agent Indications:    Stroke i163.9  History:         Patient has no prior history of Echocardiogram examinations.  Sonographer:    Irving Burton Senior RDCS Referring Phys: [id number] VASUNDHRA RATHORE IMPRESSIONS  1. There appears to be an oblong 0.5 x 0.75 cm mobile filling defect in the LV apex, which is severely hypokinetic/aneurysmal, consistent with thrombus.. Left ventricular ejection fraction, by estimation, is 60 to 65%. The left ventricle has normal function. The left ventricle demonstrates regional wall motion abnormalities (see scoring diagram/findings for description). Left ventricular diastolic parameters are consistent with Grade I diastolic dysfunction (impaired relaxation). There is severe akinesis of the left ventricular, apical apical segment.  2. Right ventricular systolic function is normal. The right ventricular size is normal. There is normal pulmonary artery systolic pressure. The estimated right ventricular systolic pressure is 28.8 mmHg.  3. The mitral valve is grossly normal. Trivial mitral valve regurgitation.  4. The aortic valve is tricuspid. Aortic valve regurgitation is not visualized.  5. The inferior vena cava is normal in size with greater than 50% respiratory variability, suggesting right atrial pressure of 3 mmHg. Conclusion(s)/Recommendation(s): LV Apical thrombus is noted, anticoagulation is recommended per current guidelines. FINDINGS  Left Ventricle: There appears to be an oblong 0.5 x 0.75 cm mobile filling defect in the LV apex, which is severely hypokinetic/aneurysmal, consistent with thrombus. Left ventricular ejection fraction, by estimation, is 60 to 65%. The left ventricle has  normal function. The left ventricle demonstrates regional wall motion abnormalities. Severe akinesis of the left ventricular, apical apical segment. Definity contrast agent was given IV to delineate the left ventricular endocardial borders. The left ventricular internal cavity size was normal in size. There is no left ventricular hypertrophy. Left  ventricular diastolic parameters are consistent with Grade I diastolic dysfunction (impaired relaxation). Indeterminate filling pressures. Right Ventricle: The right ventricular size is normal. No increase in right ventricular wall thickness. Right ventricular systolic function is normal. There is normal pulmonary artery systolic pressure. The tricuspid regurgitant velocity is 2.54 m/s, and  with an assumed right atrial pressure of 3 mmHg, the estimated right ventricular systolic pressure is 28.8 mmHg. Left Atrium: Left atrial size was normal in size. Right Atrium: Right atrial size was normal in size. Pericardium: There is no evidence of pericardial effusion. Mitral Valve: The mitral valve is grossly normal. Trivial mitral valve regurgitation. Tricuspid Valve: The tricuspid valve is grossly normal. Tricuspid valve regurgitation is trivial. Aortic Valve: The aortic valve is tricuspid. Aortic valve regurgitation is not visualized. Pulmonic Valve: The pulmonic valve was normal in structure. Pulmonic valve regurgitation is not visualized. Aorta: The aortic root and ascending aorta are structurally normal, with no evidence of dilitation. Venous: The inferior vena cava is normal in size with greater than  50% respiratory variability, suggesting right atrial pressure of 3 mmHg. IAS/Shunts: No atrial level shunt detected by color flow Doppler.  LEFT VENTRICLE PLAX 2D LVIDd:         4.00 cm  Diastology LVIDs:         2.50 cm  LV e' lateral:   6.64 cm/s LV PW:         1.00 cm  LV E/e' lateral: 10.6 LV IVS:        1.00 cm  LV e' medial:    5.66 cm/s LVOT diam:     2.00 cm  LV E/e' medial:  12.4 LV SV:         63 LV SV Index:   31 LVOT Area:     3.14 cm  RIGHT VENTRICLE RV S prime:     15.40 cm/s TAPSE (M-mode): 2.0 cm LEFT ATRIUM             Index       RIGHT ATRIUM           Index LA diam:        3.60 cm 1.79 cm/m  RA Area:     12.30 cm LA Vol (A2C):   46.3 ml 22.96 ml/m RA Volume:   28.50 ml  14.13 ml/m LA Vol (A4C):    42.3 ml 20.98 ml/m LA Biplane Vol: 44.1 ml 21.87 ml/m  AORTIC VALVE LVOT Vmax:   109.00 cm/s LVOT Vmean:  74.000 cm/s LVOT VTI:    0.201 m  AORTA Ao Root diam: 3.00 cm Ao Asc diam:  3.30 cm MITRAL VALVE                TRICUSPID VALVE MV Area (PHT): 3.63 cm     TR Peak grad:   25.8 mmHg MV Decel Time: 209 msec     TR Vmax:        254.00 cm/s MV E velocity: 70.30 cm/s MV A velocity: 112.00 cm/s  SHUNTS MV E/A ratio:  0.63         Systemic VTI:  0.20 m                             Systemic Diam: 2.00 cm Zoila Shutter MD Electronically signed by Zoila Shutter MD Signature Date/Time: 10/02/2019/3:48:08 PM    Final    CT HEAD CODE STROKE WO CONTRAST  Result Date: 10/01/2019 CLINICAL DATA:  Code stroke. Initial evaluation for acute ataxia, left-sided weakness, altered mental status. EXAM: CT HEAD WITHOUT CONTRAST TECHNIQUE: Contiguous axial images were obtained from the base of the skull through the vertex without intravenous contrast. COMPARISON:  None available. FINDINGS: Brain: Generalized age-related cerebral atrophy. Patchy and confluent hypodensity throughout the periventricular deep white matter both cerebral hemispheres, most consistent with chronic small vessel ischemic disease, advanced in nature. Chronic small microvascular ischemic changes noted involving the deep gray nuclei and pons as well. Focal encephalomalacia within the right parieto-occipital region compatible with a chronic ischemic infarct. No acute intracranial hemorrhage. No acute large vessel territory infarct. No mass lesion, midline shift or mass effect. No hydrocephalus or extra-axial fluid collection. Vascular: No hyperdense vessel. Scattered vascular calcifications noted within the carotid siphons. Skull: Scalp soft tissues demonstrate no acute finding. Calvarium intact. Sinuses/Orbits: Globes and orbital soft tissues within normal limits. Left maxillary sinus retention cyst noted. Scattered mucosal thickening noted throughout the sphenoid  ethmoidal sinuses, with superimposed air-fluid level within the right sphenoid  sinus. Trace left mastoid effusion. Other: None. ASPECTS [facility] Stroke Program Early CT Score) - Ganglionic level infarction (caudate, lentiform nuclei, internal capsule, insula, M1-M3 cortex): 7 - Supraganglionic infarction (M4-M6 cortex): 3 Total score (0-10 with 10 being normal): 10 IMPRESSION: 1. No acute intracranial infarct or other abnormality. 2. ASPECTS is 10. 3. Chronic right parieto-occipital infarct. 4. Underlying age-related cerebral atrophy with advanced chronic microvascular ischemic disease. Critical Value/emergent results were called by telephone at the time of interpretation on 10/01/2019 at 7:57 pm to provider Dr. Wilford Corner, Who verbally acknowledged these results. Electronically Signed   By: Rise Mu M.D.   On: 10/01/2019 20:10    (Echo, Carotid, EGD, Colonoscopy, ERCP)    Subjective: Patient seen and examined.  No overnight events.  She got good night sleep with melatonin.  She was happy to be able to use both hands.  She feels she is ready to go to rehab.   Discharge Exam: Vitals:   10/05/19 0300 10/05/19 0745  BP: 130/68 124/72  Pulse: 92 88  Resp: 20 16  Temp: 98.4 F (36.9 C) 98.5 F (36.9 C)  SpO2: 99% 97%   Vitals:   10/04/19 2100 10/05/19 0000 10/05/19 0300 10/05/19 0745  BP: 124/78 (!) 142/68 130/68 124/72  Pulse: 92 89 92 88  Resp: 17 18 20 16   Temp: 98.1 F (36.7 C) 98.5 F (36.9 C) 98.4 F (36.9 C) 98.5 F (36.9 C)  TempSrc: Oral Oral Oral Oral  SpO2: 99% 100% 99% 97%  Weight:      Height:        General: Pt is alert, awake, not in acute distress Cardiovascular: RRR, S1/S2 +, no rubs, no gallops Respiratory: CTA bilaterally, no wheezing, no rhonchi Abdominal: Soft, NT, ND, bowel sounds + Extremities: no edema, no cyanosis Neuro exam: No cranial nerve deficit.  Left hemiparesis 4+/5.  Right is normal.   The results of significant diagnostics from this  hospitalization (including imaging, microbiology, ancillary and laboratory) are listed below for reference.     Microbiology: No results found for this or any previous visit (from the past 240 hour(s)).   Labs: BNP (last 3 results) No results for input(s): BNP in the last 8760 hours. Basic Metabolic Panel: Recent Labs  Lab 10/01/19 2056 10/01/19 2105 10/02/19 0630 10/04/19 0804  NA 138 140 140 138  K 2.3* 2.3* 3.5 4.1  CL 102 102 107 105  CO2 22  --  23 24  GLUCOSE 106* 101* 107* 114*  BUN 14 15 8 8   CREATININE 1.08* 0.80 0.71 0.61  CALCIUM 8.9  --  8.5* 9.0  MG  --   --  1.7 1.8  PHOS  --   --   --  2.4*   Liver Function Tests: Recent Labs  Lab 10/01/19 2056  AST 35  ALT 23  ALKPHOS 88  BILITOT 1.0  PROT 6.1*  ALBUMIN 3.5   No results for input(s): LIPASE, AMYLASE in the last 168 hours. No results for input(s): AMMONIA in the last 168 hours. CBC: Recent Labs  Lab 10/01/19 2056 10/01/19 2105  WBC 7.3  --   NEUTROABS 4.8  --   HGB 14.0 13.6  HCT 40.7 40.0  MCV 91.5  --   PLT 158  --    Cardiac Enzymes: No results for input(s): CKTOTAL, CKMB, CKMBINDEX, TROPONINI in the last 168 hours. BNP: Invalid input(s): POCBNP CBG: Recent Labs  Lab 10/01/19 2256  GLUCAP 97   D-Dimer No results for  input(s): DDIMER in the last 72 hours. Hgb A1c No results for input(s): HGBA1C in the last 72 hours. Lipid Profile No results for input(s): CHOL, HDL, LDLCALC, TRIG, CHOLHDL, LDLDIRECT in the last 72 hours. Thyroid function studies No results for input(s): TSH, T4TOTAL, T3FREE, THYROIDAB in the last 72 hours.  Invalid input(s): FREET3 Anemia work up No results for input(s): VITAMINB12, FOLATE, FERRITIN, TIBC, IRON, RETICCTPCT in the last 72 hours. Urinalysis    Component Value Date/Time   COLORURINE AMBER (A) 10/16/2017 0608   APPEARANCEUR CLOUDY (A) 10/16/2017 0608   LABSPEC 1.019 10/16/2017 0608   PHURINE 5.0 10/16/2017 0608   GLUCOSEU NEGATIVE 10/16/2017  0608   HGBUR SMALL (A) 10/16/2017 0608   BILIRUBINUR SMALL (A) 10/16/2017 0608   KETONESUR NEGATIVE 10/16/2017 0608   PROTEINUR >=300 (A) 10/16/2017 0608   NITRITE NEGATIVE 10/16/2017 0608   LEUKOCYTESUR MODERATE (A) 10/16/2017 0608   Sepsis Labs Invalid input(s): PROCALCITONIN,  WBC,  LACTICIDVEN Microbiology No results found for this or any previous visit (from the past 240 hour(s)).   Time coordinating discharge: 35 minutes  SIGNED:   Dorcas Carrow, MD  [facility] 10/05/2019, 10:50 AM

## 2019-10-05 NOTE — PMR Pre-admission (Signed)
PMR Admission Coordinator Pre-Admission Assessment  Patient: [name] Good is an 69 y.o., female MRN: [id number] DOB: 05-01-1950 Height: 5\' 1"  (154.9 cm) Weight: 105.8 kg             Insurance Information HMO: yes    PPO:      PCP:      IPA:      80/20:      OTHER:  PRIMARY: [employer] Medicare      Policy#:      Subscriber: pt CM Name: [id number]      Phone#: [phone] ext [phone]     Fax#: [phone] Pre-Cert#: [id number]      Employer:  Benefits:  Phone #: [phone]     Name: 6/9 Eff. Date: 04/28/2019     Deduct: none      Out of Pocket Max: $4500      Life Max: none  CIR: $325 co pay per day days 1 until 57      SNF: no copay days 1 until 20; $184 per day days 21 until 45; no copay days 46 until 100 Outpatient: $35 per visit     Co-Pay: visits per medical neccesity Home Health: 100%      Co-Pay: visits per medical neccesity DME: 80%     Co-Pay: 20% Providers: in network  SECONDARY: none  Financial Counselor:       Phone#:   The 57" for patients in Inpatient Rehabilitation Facilities with attached "Privacy Act Statement-Health Care Records" was provided and verbally reviewed with: Patient and Family  Emergency Contact Information Contact Information    Name Relation Home Work Mobile   [place] Spouse [phone]     Diyana, Starrett Daughter [phone]  [phone]     Current Medical History  Patient Admitting Diagnosis: CVA  History of Present Illness:  69 year old right-handed female with history of depression, CAD, lumbar spine fusion complicated by wound dehiscence 2017, RLS, quit smoking 6 years ago.   Presented 10/01/2019 with left-sided weakness and slurred speech.  Cranial CT scan negative for acute changes.  Chronic right parieto-occipital infarction.  CT angiogram of head and neck negative for emergent large vessel occlusion.  MRI of the brain showed a 13 mm acute ischemic nonhemorrhagic right thalamic infarction.   Echocardiogram with ejection fraction of 65% grade 1 diastolic dysfunction as well as LV thrombus.  Admission chemistries with potassium 2.3, creatinine 1.08.  Initially maintained on aspirin and Plavix for CVA prophylaxis changed to Eliquis after findings of LV thrombus.  Plans to follow-up cardiology services Dr. 12/01/2019 in regards to LV apical thrombus with repeat echocardiogram in 3 months.  Tolerating a regular diet.   Complete NIHSS TOTAL: 5 Glasgow Coma Scale Score: 14  Past Medical History  Past Medical History:  Diagnosis Date  . Arthritis   . Depression   . GERD (gastroesophageal reflux disease)   . Myocardial infarction (HCC) 89   no visits to cardiac dr(thomas kelly) since 89  . Wound dehiscence    lumbar    Family History  family history is not on file.  Prior Rehab/Hospitalizations:  Has the patient had prior rehab or hospitalizations prior to admission? Yes  Has the patient had major surgery during 100 days prior to admission? No  Current Medications   Current Facility-Administered Medications:  .   stroke: mapping our early stages of recovery book, , Does not apply, Once, Algie Coffer, MD .  acetaminophen (TYLENOL) tablet 650 mg, 650 mg, Oral,  Q4H PRN, 650 mg at 10/05/19 0856 **OR** acetaminophen (TYLENOL) 160 MG/5ML solution 650 mg, 650 mg, Per Tube, Q4H PRN, 650 mg at 10/02/19 2002 **OR** acetaminophen (TYLENOL) suppository 650 mg, 650 mg, Rectal, Q4H PRN, John Giovanni, MD .  apixaban (ELIQUIS) tablet 10 mg, 10 mg, Oral, BID, 10 mg at 10/05/19 0856 **FOLLOWED BY** [START ON 10/10/2019] apixaban (ELIQUIS) tablet 5 mg, 5 mg, Oral, BID, Marinda Elk, MD .  atorvastatin (LIPITOR) tablet 80 mg, 80 mg, Oral, Daily, John Giovanni, MD, 80 mg at 10/05/19 0900 .  feeding supplement (BOOST / RESOURCE BREEZE) liquid 1 Container, 1 Container, Oral, TID BM, Marinda Elk, MD, 1 Container at 10/04/19 2100 .  melatonin tablet 3 mg, 3 mg, Oral, QHS,  Ghimire, Kuber, MD, 3 mg at 10/04/19 2109 .  methocarbamol (ROBAXIN) tablet 500 mg, 500 mg, Oral, Q8H PRN, Marikay Alar, FNP, 500 mg at 10/05/19 0900 .  multivitamin with minerals tablet 1 tablet, 1 tablet, Oral, Daily, Marinda Elk, MD, 1 tablet at 10/05/19 0900 .  oxyCODONE-acetaminophen (PERCOCET/ROXICET) 5-325 MG per tablet 1 tablet, 1 tablet, Oral, Q6H PRN, Dorcas Carrow, MD, 1 tablet at 10/05/19 0340 .  potassium chloride SA (KLOR-CON) CR tablet 20 mEq, 20 mEq, Oral, BID, Marinda Elk, MD, 20 mEq at 10/05/19 0900 .  rOPINIRole (REQUIP) tablet 3 mg, 3 mg, Oral, QHS PRN, John Giovanni, MD, 3 mg at 10/04/19 2109  Patients Current Diet:  Diet Order            Diet - low sodium heart healthy           Diet Heart Room service appropriate? Yes; Fluid consistency: Thin  Diet effective now                 Precautions / Restrictions Precautions Precautions: Fall Restrictions Weight Bearing Restrictions: No   Has the patient had 2 or more falls or a fall with injury in the past year?No  Prior Activity Level Limited Community (1-2x/wk): independent and driving  Prior Functional Level Prior Function Level of Independence: Independent with assistive device(s) Comments: used cane pta  Self Care: Did the patient need help bathing, dressing, using the toilet or eating?  Independent  Indoor Mobility: Did the patient need assistance with walking from room to room (with or without device)? Independent  Stairs: Did the patient need assistance with internal or external stairs (with or without device)? Independent  Functional Cognition: Did the patient need help planning regular tasks such as shopping or remembering to take medications? Independent  Home Assistive Devices / Equipment Home Assistive Devices/Equipment: Cane (specify quad or straight) Home Equipment: Cane - single point, Walker - 4 wheels, Other (comment) (toilet extension)  Prior Device Use:  Indicate devices/aids used by the patient prior to current illness, exacerbation or injury? cane  Current Functional Level Cognition  Arousal/Alertness: Awake/alert Overall Cognitive Status: Impaired/Different from baseline Current Attention Level: Focused Orientation Level: Oriented to person, Oriented to place, Oriented to time, Disoriented to situation Following Commands: Follows one step commands consistently, Follows one step commands with increased time, Follows multi-step commands inconsistently Safety/Judgement: Decreased awareness of safety, Decreased awareness of deficits General Comments: patient reports increased confusion today voicing "I didn't know where I was this morning" but pt oriented during session, patient requires 1 step cueing, constant redirection to task as perseverating on "tightness" of gown on L side  Attention: Sustained Sustained Attention: Impaired Sustained Attention Impairment: Verbal basic, Functional basic Memory: Impaired Memory Impairment: Storage  deficit, Decreased recall of new information Problem Solving: Impaired Problem Solving Impairment: Verbal basic, Functional basic Safety/Judgment: Impaired    Extremity Assessment (includes Sensation/Coordination)  Upper Extremity Assessment: RUE deficits/detail, LUE deficits/detail RUE Deficits / Details: WFL  LUE Deficits / Details: apparent sensory/motor deficits, limb apraxia "alien arm" with poor proprioception, sensation, and coordination; pt hitting arm on bed rails multiple times LUE Sensation: decreased light touch, decreased proprioception LUE Coordination: decreased fine motor, decreased gross motor  Lower Extremity Assessment: Defer to PT evaluation LLE Deficits / Details: AAROM WFL; strength-hip flexion2+, knee extension 3-, ankle DF 3 LLE Sensation:  (absent to light touch, deep presure, pain) LLE Coordination: decreased gross motor (due to decr sensation and weakness)    ADLs  Overall  ADL's : Needs assistance/impaired Grooming: Wash/dry hands, Wash/dry face, Moderate assistance, Sitting Grooming Details (indicate cue type and reason): able to wash face with min cueing for L side of face, max assist to wash hands  Upper Body Bathing: Sitting, Moderate assistance Lower Body Bathing: Total assistance, +2 for physical assistance, +2 for safety/equipment, Sit to/from stand Upper Body Dressing : Moderate assistance, Sitting Upper Body Dressing Details (indicate cue type and reason): to don new gown, poor awarness and control of L UE; seqencing of task  Lower Body Dressing: Total assistance, +2 for physical assistance, +2 for safety/equipment Lower Body Dressing Details (indicate cue type and reason): to don socks, sit to stand with mod assist +2 Toilet Transfer: Moderate assistance, +2 for safety/equipment, +2 for physical assistance Toilet Transfer Details (indicate cue type and reason): simulated to recliner  Functional mobility during ADLs: +2 for physical assistance, +2 for safety/equipment, Moderate assistance General ADL Comments: pt limited by cognition, L sided deficits  (sesnation, coordination, motor planning), balance      Mobility  Overal bed mobility: Needs Assistance Bed Mobility: Supine to Sit Rolling: Min guard Sidelying to sit: Min assist Supine to sit: Min assist Sit to supine: Min assist, +2 for safety/equipment General bed mobility comments: min assist for safety/balance, scooting foward; cueing for sequencing and technique using bed rails with increased time required    Transfers  Overall transfer level: Needs assistance Equipment used: 2 person hand held assist Transfers: Sit to/from Stand Sit to Stand: Mod assist, +2 physical assistance Stand pivot transfers: Mod assist, +2 physical assistance, +2 safety/equipment General transfer comment: mod assist +2 to power up and steady with cueing for positioning, technique, forward lean; pivoting towards R  side with step by step cueing and tactile support to initate movement of R LE     Ambulation / Gait / Stairs / Wheelchair Mobility  Ambulation/Gait General Gait Details: unable; in standing able to lift rt foot off the floor (with stable LLE) however could not begin to lift lt foot off floor due to inability to weight shift over RLE    Posture / Balance Dynamic Sitting Balance Sitting balance - Comments: min guard statically for safety, slight R Lateral lean but much improved midline positioniing today  Balance Overall balance assessment: Needs assistance Sitting-balance support: No upper extremity supported, Feet supported Sitting balance-Leahy Scale: Poor Sitting balance - Comments: min guard statically for safety, slight R Lateral lean but much improved midline positioniing today  Postural control: Right lateral lean Standing balance support: Bilateral upper extremity supported Standing balance-Leahy Scale: Poor Standing balance comment: relies on BUE and external support, continues to have posterior lean but able to maintain with support    Special needs/care consideration  Visitors are daughters Tomma  and Melanie Hgb A1c 5.7    Previous Home Environment  Living Arrangements: Spouse/significant other  Lives With: Spouse Available Help at Discharge: Family, Available 24 hours/day (daughter Hartleigh and Threasa Beards will provide assist; spouse work) Type of Home: Mobile home Home Layout: One level Home Access: Stairs to enter Entrance Stairs-Rails: Left Entrance Stairs-Number of Steps: 3 Bathroom Shower/Tub: Chiropodist: Handicapped height Bathroom Accessibility: Yes Home Care Services: No Additional Comments: confirmed with daughter  Discharge Living Setting Plans for Discharge Living Setting: Patient's home, Mobile Home, Lives with (comment) (spouse) Type of Home at Discharge: Mobile home Discharge Home Layout: One level Discharge Home Access: Stairs to  enter Entrance Stairs-Rails: Left Entrance Stairs-Number of Steps: 3 Discharge Bathroom Shower/Tub: Tub/shower unit Discharge Bathroom Toilet: Handicapped height Discharge Bathroom Accessibility: Yes How Accessible: Accessible via walker Does the patient have any problems obtaining your medications?: No  Social/Family/Support Systems Patient Roles: Spouse, Parent Contact Information: Barnett Applebaum, daughter Anticipated Caregiver: daughters Anticipated Caregiver's Contact Information: see above Ability/Limitations of Caregiver: [place] off work for summer, [place] lives next door but cares for handicapped spouse Caregiver Availability: 24/7 Discharge Plan Discussed with Primary Caregiver: Yes Is Caregiver In Agreement with Plan?: Yes Does Caregiver/Family have Issues with Lodging/Transportation while Pt is in Rehab?: No  Goals Patient/Family Goal for Rehab: Mod I to superivison with PT, OT, and SLP Expected length of stay: ELOS 10 to 14 days Pt/Family Agrees to Admission and willing to participate: Yes Program Orientation Provided & Reviewed with Pt/Caregiver Including Roles  & Responsibilities: Yes  Decrease burden of Care through IP rehab admission: n/a  Possible need for SNF placement upon discharge:not anticipated  Patient Condition: This patient's condition remains as documented in the consult dated 10/03/2019, in which the Rehabilitation Physician determined and documented that the patient's condition is appropriate for intensive rehabilitative care in an inpatient rehabilitation facility. Will admit to inpatient rehab today.  Preadmission Screen Completed By:  Cleatrice Burke, RN, 10/05/2019 10:59 AM ______________________________________________________________________   Discussed status with Dr. Dagoberto Ligas on 10/05/2019 at  106 and received approval for admission today.  Admission Coordinator:  Cleatrice Burke, time 4193 Date 10/05/2019

## 2019-10-05 NOTE — Consult Note (Signed)
Ref: Leonard Downing, MD    Subjective:  Awake. Self feeding using both hands.  Afebrile. Denies any bleed form Apixaban use.  Objective:  Vital Signs in the last 24 hours: Temp:  [98 F (36.7 C)-98.5 F (36.9 C)] 98.5 F (36.9 C) (06/10 0745) Pulse Rate:  [88-98] 88 (06/10 0745) Cardiac Rhythm: Normal sinus rhythm (06/10 0730) Resp:  [16-20] 16 (06/10 0745) BP: (124-148)/(68-81) 124/72 (06/10 0745) SpO2:  [97 %-100 %] 97 % (06/10 0745)  Physical Exam: BP Readings from Last 1 Encounters:  10/05/19 124/72     Wt Readings from Last 1 Encounters:  10/02/19 105.8 kg    Weight change:  Body mass index is 44.07 kg/m. HEENT: [place]/AT, Eyes-LightBrown, PERL, EOMI, Conjunctiva-Pink, Sclera-Non-icteric Neck: No JVD, No bruit, Trachea midline. Lungs:  Clear, Bilateral. Cardiac:  Regular rhythm, normal S1 and S2, no S3. II/VI systolic murmur. Abdomen:  Soft, non-tender. BS present. Extremities:  No edema present. No cyanosis. No clubbing. CNS: AxOx2, Cranial nerves grossly intact, moves all 4 extremities. Decreasing left sided weakness.  Skin: Warm and dry.   Intake/Output from previous day: 06/09 0701 - 06/10 0700 In: 100 [P.O.:100] Out: 550 [Urine:550]    Lab Results: BMET    Component Value Date/Time   NA 138 10/04/2019 0804   NA 140 10/02/2019 0630   NA 140 10/01/2019 2105   K 4.1 10/04/2019 0804   K 3.5 10/02/2019 0630   K 2.3 (LL) 10/01/2019 2105   CL 105 10/04/2019 0804   CL 107 10/02/2019 0630   CL 102 10/01/2019 2105   CO2 24 10/04/2019 0804   CO2 23 10/02/2019 0630   CO2 22 10/01/2019 2056   GLUCOSE 114 (H) 10/04/2019 0804   GLUCOSE 107 (H) 10/02/2019 0630   GLUCOSE 101 (H) 10/01/2019 2105   BUN 8 10/04/2019 0804   BUN 8 10/02/2019 0630   BUN 15 10/01/2019 2105   CREATININE 0.61 10/04/2019 0804   CREATININE 0.71 10/02/2019 0630   CREATININE 0.80 10/01/2019 2105   CALCIUM 9.0 10/04/2019 0804   CALCIUM 8.5 (L) 10/02/2019 0630   CALCIUM 8.9  10/01/2019 2056   GFRNONAA >60 10/04/2019 0804   GFRNONAA >60 10/02/2019 0630   GFRNONAA 52 (L) 10/01/2019 2056   GFRAA >60 10/04/2019 0804   GFRAA >60 10/02/2019 0630   GFRAA >60 10/01/2019 2056   CBC    Component Value Date/Time   WBC 7.3 10/01/2019 2056   RBC 4.45 10/01/2019 2056   HGB 13.6 10/01/2019 2105   HCT 40.0 10/01/2019 2105   PLT 158 10/01/2019 2056   MCV 91.5 10/01/2019 2056   MCH 31.5 10/01/2019 2056   MCHC 34.4 10/01/2019 2056   RDW 12.4 10/01/2019 2056   LYMPHSABS 1.5 10/01/2019 2056   MONOABS 0.7 10/01/2019 2056   EOSABS 0.2 10/01/2019 2056   BASOSABS 0.1 10/01/2019 2056   HEPATIC Function Panel Recent Labs    10/01/19 2056  PROT 6.1*   HEMOGLOBIN A1C No components found for: HGA1C,  MPG CARDIAC ENZYMES Lab Results  Component Value Date   CKTOTAL 192 (H) 07/17/2016   BNP No results for input(s): PROBNP in the last 8760 hours. TSH No results for input(s): TSH in the last 8760 hours. CHOLESTEROL Recent Labs    10/02/19 0630  CHOL 176    Scheduled Meds: .  stroke: mapping our early stages of recovery book   Does not apply Once  . apixaban  10 mg Oral BID   Followed by  . [START  ON 10/10/2019] apixaban  5 mg Oral BID  . atorvastatin  80 mg Oral Daily  . feeding supplement  1 Container Oral TID BM  . melatonin  3 mg Oral QHS  . multivitamin with minerals  1 tablet Oral Daily  . potassium chloride  20 mEq Oral BID   Continuous Infusions: PRN Meds:.acetaminophen **OR** acetaminophen (TYLENOL) oral liquid 160 mg/5 mL **OR** acetaminophen, methocarbamol, oxyCODONE-acetaminophen, rOPINIRole  Assessment/Plan: LV apical thrombus S/P acute ischemic right thalamic infarct Old strokes CAD GERD Depression Morbid obesity  Continue Apixaban as scheduled. F/U in 1 month, 3 months and 6 months.   LOS: 4 days   Time spent including chart review, lab review, examination, discussion with patient : 30 min   Orpah Cobb  MD  10/05/2019, 8:11  AM

## 2019-10-06 ENCOUNTER — Inpatient Hospital Stay (HOSPITAL_COMMUNITY): Payer: Medicare Other | Admitting: Physical Therapy

## 2019-10-06 ENCOUNTER — Inpatient Hospital Stay (HOSPITAL_COMMUNITY): Payer: Medicare Other | Admitting: Speech Pathology

## 2019-10-06 ENCOUNTER — Inpatient Hospital Stay (HOSPITAL_COMMUNITY): Payer: Medicare Other | Admitting: Occupational Therapy

## 2019-10-06 LAB — COMPREHENSIVE METABOLIC PANEL
ALT: 23 U/L (ref 0–44)
AST: 32 U/L (ref 15–41)
Albumin: 2.9 g/dL — ABNORMAL LOW (ref 3.5–5.0)
Alkaline Phosphatase: 91 U/L (ref 38–126)
Anion gap: 7 (ref 5–15)
BUN: 17 mg/dL (ref 8–23)
CO2: 23 mmol/L (ref 22–32)
Calcium: 9.2 mg/dL (ref 8.9–10.3)
Chloride: 104 mmol/L (ref 98–111)
Creatinine, Ser: 0.79 mg/dL (ref 0.44–1.00)
GFR calc Af Amer: >60 mL/min (ref >60)
GFR calc non Af Amer: >60 mL/min (ref >60)
Glucose, Bld: 110 mg/dL — ABNORMAL HIGH (ref 70–99)
Potassium: 4.6 mmol/L (ref 3.5–5.1)
Sodium: 134 mmol/L — ABNORMAL LOW (ref 135–145)
Total Bilirubin: 0.9 mg/dL (ref 0.3–1.2)
Total Protein: 6.4 g/dL — ABNORMAL LOW (ref 6.5–8.1)

## 2019-10-06 LAB — CBC WITH DIFFERENTIAL/PLATELET
Abs Immature Granulocytes: 0.03 10*3/uL (ref 0.00–0.07)
Basophils Absolute: 0.1 10*3/uL (ref 0.0–0.1)
Basophils Relative: 1 %
Eosinophils Absolute: 0.5 10*3/uL (ref 0.0–0.5)
Eosinophils Relative: 5 %
HCT: 43.9 % (ref 36.0–46.0)
Hemoglobin: 14.4 g/dL (ref 12.0–15.0)
Immature Granulocytes: 0 %
Lymphocytes Relative: 27 %
Lymphs Abs: 2.6 10*3/uL (ref 0.7–4.0)
MCH: 30.9 pg (ref 26.0–34.0)
MCHC: 32.8 g/dL (ref 30.0–36.0)
MCV: 94.2 fL (ref 80.0–100.0)
Monocytes Absolute: 1 10*3/uL (ref 0.1–1.0)
Monocytes Relative: 10 %
Neutro Abs: 5.6 10*3/uL (ref 1.7–7.7)
Neutrophils Relative %: 57 %
Platelets: 304 10*3/uL (ref 150–400)
RBC: 4.66 MIL/uL (ref 3.87–5.11)
RDW: 12.4 % (ref 11.5–15.5)
WBC: 9.8 10*3/uL (ref 4.0–10.5)
nRBC: 0 % (ref 0.0–0.2)

## 2019-10-06 MED ORDER — ONDANSETRON HCL 4 MG PO TABS
4.0000 mg | ORAL_TABLET | Freq: Three times a day (TID) | ORAL | Status: DC | PRN
  Administered 2019-10-06: 4 mg via ORAL
  Filled 2019-10-06 (×2): qty 1

## 2019-10-06 MED ORDER — GABAPENTIN 100 MG PO CAPS
100.0000 mg | ORAL_CAPSULE | Freq: Every day | ORAL | Status: DC
  Administered 2019-10-06: 100 mg via ORAL
  Filled 2019-10-06: qty 1

## 2019-10-06 NOTE — Progress Notes (Signed)
Patient information reviewed and entered into [facility] System by Becky Brysun Eschmann, PPS coordinator. Information including medical coding, function ability, and quality indicators will be reviewed and updated through discharge.   

## 2019-10-06 NOTE — Evaluation (Signed)
Occupational Therapy Assessment and Plan  Patient Details  Name: [name] Good MRN: [id number] Date of Birth: [birth year]  OT Diagnosis: abnormal posture, acute pain, cognitive deficits, disturbance of vision, hemiplegia affecting dominant side, muscle weakness (generalized) and coordination disorder Rehab Potential: Rehab Potential (ACUTE ONLY): Good ELOS: 14-19 days   Today's Date: 10/06/2019 OT Individual Time: 1505-1600 OT Individual Time Calculation (min): 55 min     Problem List:  Patient Active Problem List   Diagnosis Date Noted  . 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 Severe dehydration 10/02/2019  . Grief reaction 10/02/2019  . RLS (restless legs syndrome) 10/02/2019  . Acute CVA (cerebrovascular accident) ([place]) 10/01/2019  . Hypokalemia 10/01/2019  . Cellulitis of left upper extremity 10/18/2017  . Arthritis 10/18/2017  . Septic shock ([place]) 10/16/2017  . Former smoker 11/13/2016  . History of restless legs syndrome 11/13/2016  . Nontraumatic incomplete tear of right rotator cuff 08/18/2016  . Autoimmune disease ([place]) 08/12/2016  . Primary osteoarthritis of both knees 08/12/2016  . Primary osteoarthritis of both feet 08/12/2016  . Obesity (BMI 35.0-39.9 without comorbidity) 08/12/2016  . Osteoarthritis of right hip 10/25/2015  . 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 S/P lumbar laminectomy 06/14/2015    Past Medical History:  Past Medical History:  Diagnosis Date  . Anxiety   . Arthritis   . Depression   . GERD (gastroesophageal reflux disease)   . Myocardial infarction ([place]) 89   no visits to cardiac dr(thomas kelly) since 92  . Stroke ([place])   . Wound dehiscence    lumbar   Past Surgical History:  Past Surgical History:  Procedure Laterality Date  . APPENDECTOMY     17 yrs  . BACK SURGERY    . BREAST SURGERY Left    cyst  . CARDIAC CATHETERIZATION  89  . CHOLECYSTECTOMY     69 yrs old  . LUMBAR LAMINECTOMY/DECOMPRESSION  MICRODISCECTOMY N/A 06/14/2015   Procedure: Lumbar Three-Four,Lumbar Four-Five, Lumbar Five-Sacral One Laminectomy;  Surgeon: Eustace Moore, MD;  Location: [facility];  Service: Neurosurgery;  Laterality: N/A;  . LUMBAR WOUND DEBRIDEMENT N/A 07/24/2015   Procedure: lumbar wound revision;  Surgeon: Eustace Moore, MD;  Location: [facility];  Service: Neurosurgery;  Laterality: N/A;  . TOTAL HIP ARTHROPLASTY Right 10/25/2015   Procedure: RIGHT TOTAL HIP ARTHROPLASTY ANTERIOR APPROACH;  Surgeon: Mcarthur Rossetti, MD;  Location: [facility];  Service: Orthopedics;  Laterality: Right;  . TUBAL LIGATION      Assessment & Plan Clinical Impression: Patient is a 69 y.o. year old female with history of depression, CAD, lumbar spine fusion complicated by wound dehiscence 2017, RLS, quit smoking 6 years ago.  Per chart review lives with spouse independent with assistive device.  Mobile home 3 steps to entry.  Presented 10/01/2019 with left-sided weakness and slurred speech.  Cranial CT scan negative for acute changes.  Chronic right parieto-occipital infarction.  CT angiogram of head and neck negative for emergent large vessel occlusion.  MRI of the brain showed a 13 mm acute ischemic nonhemorrhagic right thalamic infarction.  Echocardiogram with ejection fraction of 52% grade 1 diastolic dysfunction as well as LV thrombus.  Admission chemistries with potassium 2.3, creatinine 1.08.  Initially maintained on aspirin and Plavix for CVA prophylaxis changed to Eliquis after findings of LV thrombus.  Plans to follow-up cardiology services Dr. Doylene Canard in regards to LV apical thrombus with repeat echocardiogram in 3 months. .  Patient transferred to  [facility] on 10/05/2019 .    Patient currently requires max with basic self-care skills secondary to muscle weakness, decreased cardiorespiratoy endurance, decreased coordination and decreased motor planning, visual deficits, decreased attention to left, decreased initiation, decreased  attention, decreased awareness, decreased problem solving, decreased safety awareness and decreased memory and decreased sitting balance, decreased standing balance and decreased balance strategies.  Prior to hospitalization, patient could complete ADLs and IADLs with modified independent .  Patient will benefit from skilled intervention to decrease level of assist with basic self-care skills prior to discharge home with care partner.  Anticipate patient will require 24 hour supervision and minimal physical assistance and follow up home health.  OT - End of Session Activity Tolerance: Decreased this session Endurance Deficit: Yes Endurance Deficit Description: activity limited also by pain today OT Assessment Rehab Potential (ACUTE ONLY): Good OT Barriers to Discharge: Other (comments) OT Barriers to Discharge Comments: none known at this time OT Patient demonstrates impairments in the following area(s): Balance;Cognition;Endurance;Pain;Safety;Vision OT Basic ADL's Functional Problem(s): Bathing;Dressing;Toileting;Grooming OT Advanced ADL's Functional Problem(s): Simple Meal Preparation;Laundry OT Transfers Functional Problem(s): Toilet;Tub/Shower OT Additional Impairment(s): None OT Plan OT Intensity: Minimum of 1-2 x/day, 45 to 90 minutes OT Frequency: 5 out of 7 days OT Duration/Estimated Length of Stay: 14-19 days OT Treatment/Interventions: Balance/vestibular training;DME/adaptive equipment instruction;Patient/family education;Therapeutic Activities;Cognitive remediation/compensation;Psychosocial support;Therapeutic Exercise;Community reintegration;Functional mobility training;UE/LE Strength taining/ROM;Self Care/advanced ADL retraining;Discharge planning;Neuromuscular re-education;UE/LE Coordination activities;Pain management;Visual/perceptual remediation/compensation OT Basic Self-Care Anticipated Outcome(s): S - CGA OT Toileting Anticipated Outcome(s): supervision OT Bathroom  Transfers Anticipated Outcome(s): supervision- CGA OT Recommendation Recommendations for Other Services: Neuropsych consult Patient destination: Home Follow Up Recommendations: 24 hour supervision/assistance;Home health OT Equipment Recommended: To be determined   Skilled Therapeutic Intervention Upon entering the room, pt supine in bed with 7/10 c/o pain in B feet. Pt refuses functional transfers and standing during this session. OT educated pt on OT purpose, POC, and goals with pt verbalizing understanding and agreement. Pt performed supine >sit with min A to EOB. Bathing on EOB with mod A. Pt donning pull over shirt with mod A. Pt needing assistance to thread pants over B feet and attempting lateral leans to pull over B hips. Pt unable to pull over B hips and returns to supine. Pt rolling L <> R with min A and therapist pulling pants over B hips. Pt very fatigued at this point and repots, " I have been worked today". OT assisting pt with repositioning in bed. Bed alarm activated and call bell within reach.   OT Evaluation Precautions/Restrictions  Precautions Precautions: Fall   Pain Pain Assessment Pain Scale: 0-10 Pain Score: 7  Pain Type: Acute pain Pain Location: Foot Pain Orientation: Right;Left Pain Descriptors / Indicators: Pins and needles;Burning Pain Frequency: Constant Pain Onset: On-going Patients Stated Pain Goal: 3 Pain Intervention(s): RN made aware Multiple Pain Sites: No Home Living/Prior Functioning Home Living Family/patient expects to be discharged to:: Private residence Living Arrangements: Spouse/significant other, Children Available Help at Discharge: Family, Available 24 hours/day Type of Home: Mobile home Home Access: Stairs to enter Technical brewer of Steps: 3 Entrance Stairs-Rails: Left Home Layout: One level Bathroom Shower/Tub: Chiropodist: Handicapped height Bathroom Accessibility: Yes  Lives With: Spouse Prior  Function Level of Independence: Independent with gait, Requires assistive device for independence, Independent with transfers  Able to Take Stairs?: Yes Driving: Yes Comments: used cane pta Vision Baseline Vision/History: Wears glasses Wears Glasses: Reading only Patient Visual Report: No change from baseline Vision Assessment?: Vision impaired- to  be further tested in functional context Perception  Perception: Impaired Inattention/Neglect: Does not attend to left side of body;Does not attend to left visual field Praxis Praxis: Impaired Praxis Impairment Details: Motor planning Cognition Overall Cognitive Status: Impaired/Different from baseline Arousal/Alertness: Awake/alert Orientation Level: Person;Place;Situation Person: Oriented Place: Oriented Situation: Oriented Year: 2021 Month: June Day of Week: Correct Memory: Impaired Memory Impairment: Storage deficit;Decreased recall of new information;Decreased short term memory Decreased Short Term Memory: Verbal basic;Functional complex Immediate Memory Recall: Sock;Blue;Bed Memory Recall Sock: Not able to recall Memory Recall Blue: Without Cue Memory Recall Bed: Without Cue Attention: Focused;Sustained Focused Attention: Appears intact Sustained Attention: Appears intact Alternating Attention Impairment: Verbal basic Problem Solving: Impaired Problem Solving Impairment: Verbal basic;Functional basic Executive Function: Organizing;Self Correcting Organizing: Impaired Organizing Impairment: Verbal basic;Functional basic Self Correcting: Impaired Self Correcting Impairment: Functional basic;Verbal complex Safety/Judgment: Appears intact Sensation Sensation Light Touch Impaired Details: Impaired LLE Hot/Cold: Not tested Proprioception: Impaired by gross assessment Stereognosis: Not tested Coordination Gross Motor Movements are Fluid and Coordinated: No Fine Motor Movements are Fluid and Coordinated: No Coordination  and Movement Description: impaired due to L inattention, impaired motor planning, and pain in bilateral feet Motor  Motor Motor: Other (comment) Motor - Skilled Clinical Observations: L hemiparesis Mobility  Bed Mobility Bed Mobility: Supine to Sit;Sit to Supine Supine to Sit: Moderate Assistance - Patient 50-74% Sit to Supine: Minimal Assistance - Patient > 75% Transfers Sit to Stand: Maximal Assistance - Patient 25-49% Stand to Sit: Maximal Assistance - Patient 25-49%  Trunk/Postural Assessment  Cervical Assessment Cervical Assessment: Exceptions to [facility] (forward head) Thoracic Assessment Thoracic Assessment: Exceptions to [facility] (kyphotic) Lumbar Assessment Lumbar Assessment: Exceptions to [facility] (posterior pelvic tilt) Postural Control Postural Control: Deficits on evaluation  Balance Balance Balance Assessed: Yes Static Sitting Balance Static Sitting - Balance Support: Feet supported;Bilateral upper extremity supported Static Sitting - Level of Assistance: 5: Stand by assistance Dynamic Sitting Balance Dynamic Sitting - Balance Support: Feet supported;Bilateral upper extremity supported Dynamic Sitting - Level of Assistance: 4: Min assist Extremity/Trunk Assessment RUE Assessment RUE Assessment: Within Functional Limits Passive Range of Motion (PROM) Comments: WFLs Active Range of Motion (AROM) Comments: WFLs General Strength Comments: 3+/5 LUE Assessment LUE Assessment: Within Functional Limits Passive Range of Motion (PROM) Comments: WFLs Active Range of Motion (AROM) Comments: WFLs General Strength Comments: 3+/5     Refer to Care Plan for Long Term Goals  Recommendations for other services: Neuropsych   Discharge Criteria: Patient will be discharged from OT if patient refuses treatment 3 consecutive times without medical reason, if treatment goals not met, if there is a change in medical status, if patient makes no progress towards goals or if patient is discharged  from hospital.  The above assessment, treatment plan, treatment alternatives and goals were discussed and mutually agreed upon: by patient  Gypsy Decant 10/06/2019, 5:01 PM

## 2019-10-06 NOTE — Progress Notes (Signed)
Inpatient Rehabilitation Center Individual Statement of Services  Patient Name:  [name] Good  Date:  10/06/2019  Welcome to the Inpatient Rehabilitation Center.  Our goal is to provide you with an individualized program based on your diagnosis and situation, designed to meet your specific needs.  With this comprehensive rehabilitation program, you will be expected to participate in at least 3 hours of rehabilitation therapies Monday-Friday, with modified therapy programming on the weekends.  Your rehabilitation program will include the following services:  Physical Therapy (PT), Occupational Therapy (OT), Speech Therapy (ST), 24 hour per day rehabilitation nursing, Therapeutic Recreaction (TR), Neuropsychology, Care Coordinator, Rehabilitation Medicine, Nutrition Services, Pharmacy Services and Other  Weekly team conferences will be held on Wednesdays to discuss your progress.  Your Inpatient Rehabilitation Care Coordinator will talk with you frequently to get your input and to update you on team discussions.  Team conferences with you and your family in attendance may also be held.  Expected length of stay: 10-14 Days  Overall anticipated outcome: MOD I to Supervision  Depending on your progress and recovery, your program may change. Your Inpatient Rehabilitation Care Coordinator will coordinate services and will keep you informed of any changes. Your Inpatient Rehabilitation Care Coordinator's name and contact numbers are listed  below.  The following services may also be recommended but are not provided by the Inpatient Rehabilitation Center:    Home Health Rehabiltiation Services  Outpatient Rehabilitation Services    Arrangements will be made to provide these services after discharge if needed.  Arrangements include referral to agencies that provide these services.  Your insurance has been verified to be:  [employer] Your primary doctor is:  Kaleen Mask,  MD  Pertinent information will be shared with your doctor and your insurance company.  Inpatient Rehabilitation Care Coordinator:  Lavera Guise, [place] [phone] or [phone]  Information discussed with and copy given to patient by: Andria Rhein, 10/06/2019, 11:24 AM

## 2019-10-06 NOTE — Evaluation (Signed)
Physical Therapy Assessment and Plan  Patient Details  Name: [name] Good MRN: [id number] Date of Birth: 26-Mar-1951  PT Diagnosis: Abnormal posture, Abnormality of gait, Cognitive deficits, Difficulty walking, Hemiparesis non-dominant, Impaired cognition, Muscle weakness and Pain in bilateral feet Rehab Potential: Good ELOS: ~3 weeks   Today's Date: 10/06/2019 PT Individual Time: 0905-1005 PT Individual Time Calculation (min): 60 min    Problem List:  Patient Active Problem List   Diagnosis Date Noted  . 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 Severe dehydration 10/02/2019  . Grief reaction 10/02/2019  . RLS (restless legs syndrome) 10/02/2019  . Acute CVA (cerebrovascular accident) ([place]) 10/01/2019  . Hypokalemia 10/01/2019  . Cellulitis of left upper extremity 10/18/2017  . Arthritis 10/18/2017  . Septic shock ([place]) 10/16/2017  . Former smoker 11/13/2016  . History of restless legs syndrome 11/13/2016  . Nontraumatic incomplete tear of right rotator cuff 08/18/2016  . Autoimmune disease ([place]) 08/12/2016  . Primary osteoarthritis of both knees 08/12/2016  . Primary osteoarthritis of both feet 08/12/2016  . Obesity (BMI 35.0-39.9 without comorbidity) 08/12/2016  . Osteoarthritis of right hip 10/25/2015  . 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 S/P lumbar laminectomy 06/14/2015    Past Medical History:  Past Medical History:  Diagnosis Date  . Anxiety   . Arthritis   . Depression   . GERD (gastroesophageal reflux disease)   . Myocardial infarction ([place]) 89   no visits to cardiac dr(thomas kelly) since 92  . Stroke ([place])   . Wound dehiscence    lumbar   Past Surgical History:  Past Surgical History:  Procedure Laterality Date  . APPENDECTOMY     17 yrs  . BACK SURGERY    . BREAST SURGERY Left    cyst  . CARDIAC CATHETERIZATION  89  . CHOLECYSTECTOMY     69 yrs old  . LUMBAR LAMINECTOMY/DECOMPRESSION MICRODISCECTOMY N/A 06/14/2015    Procedure: Lumbar Three-Four,Lumbar Four-Five, Lumbar Five-Sacral One Laminectomy;  Surgeon: Eustace Moore, MD;  Location: [facility];  Service: Neurosurgery;  Laterality: N/A;  . LUMBAR WOUND DEBRIDEMENT N/A 07/24/2015   Procedure: lumbar wound revision;  Surgeon: Eustace Moore, MD;  Location: [facility];  Service: Neurosurgery;  Laterality: N/A;  . TOTAL HIP ARTHROPLASTY Right 10/25/2015   Procedure: RIGHT TOTAL HIP ARTHROPLASTY ANTERIOR APPROACH;  Surgeon: Mcarthur Rossetti, MD;  Location: [facility];  Service: Orthopedics;  Laterality: Right;  . TUBAL LIGATION      Assessment & Plan Clinical Impression: Patient is a 69 y.o. year old right-handed female with history of depression, CAD, lumbar spine fusion complicated by wound dehiscence 2017, RLS, quit smoking 6 years ago.  Per chart review lives with spouse independent with assistive device.  Mobile home 3 steps to entry.  Presented 10/01/2019 with left-sided weakness and slurred speech.  Cranial CT scan negative for acute changes.  Chronic right parieto-occipital infarction.  CT angiogram of head and neck negative for emergent large vessel occlusion.  MRI of the brain showed a 13 mm acute ischemic nonhemorrhagic right thalamic infarction.  Echocardiogram with ejection fraction of 48% grade 1 diastolic dysfunction as well as LV thrombus.  Admission chemistries with potassium 2.3, creatinine 1.08.  Initially maintained on aspirin and Plavix for CVA prophylaxis changed to Eliquis after findings of LV thrombus.  Plans to follow-up cardiology services Dr. Doylene Canard in regards to LV apical thrombus with repeat echocardiogram in 3 months.  Tolerating a regular diet.  Therapy evaluations  completed and patient was admitted for a comprehensive rehab program. Patient transferred to CIR on 10/05/2019 .   Patient currently requires max assist with mobility secondary to muscle weakness, decreased cardiorespiratoy endurance, impaired timing and sequencing, unbalanced  muscle activation and decreased motor planning, decreased attention to left, decreased attention, decreased awareness, decreased problem solving, decreased memory and delayed processing and decreased sitting balance, decreased standing balance, decreased postural control and decreased balance strategies.  Prior to hospitalization, patient was modified independent  with mobility and lived with Spouse (husband works full time) in a Mobile home home.  Home access is 3Stairs to enter.  Patient will benefit from skilled PT intervention to maximize safe functional mobility, minimize fall risk and decrease caregiver burden for planned discharge home with 24 hour assist.  Anticipate patient will benefit from follow up [facility] at discharge.  PT - End of Session Activity Tolerance: Tolerates 30+ min activity with multiple rests Endurance Deficit: Yes Endurance Deficit Description: activity limited also by pain today PT Assessment Rehab Potential (ACUTE/IP ONLY): Good PT Barriers to Discharge: Inaccessible home environment;Decreased caregiver support;Home environment access/layout PT Patient demonstrates impairments in the following area(s): Balance;Perception;Behavior;Safety;Edema;Sensory;Endurance;Skin Integrity;Motor;Pain;Nutrition PT Transfers Functional Problem(s): Bed Mobility;Bed to Chair;Car;Furniture PT Locomotion Functional Problem(s): Ambulation;Stairs PT Plan PT Intensity: Minimum of 1-2 x/day ,45 to 90 minutes PT Frequency: 5 out of 7 days PT Duration Estimated Length of Stay: ~3 weeks PT Treatment/Interventions: Ambulation/gait training;Community reintegration;DME/adaptive equipment instruction;Neuromuscular re-education;Psychosocial support;Stair training;UE/LE Strength taining/ROM;Wheelchair propulsion/positioning;Balance/vestibular training;Discharge planning;Functional electrical stimulation;Pain management;Skin care/wound management;Therapeutic Activities;UE/LE Coordination activities;Cognitive  remediation/compensation;Disease management/prevention;Functional mobility training;Patient/family education;Splinting/orthotics;Therapeutic Exercise;Visual/perceptual remediation/compensation PT Transfers Anticipated Outcome(s): supervision PT Locomotion Anticipated Outcome(s): CGA using LRAD PT Recommendation Follow Up Recommendations: Home health PT;24 hour supervision/assistance Patient destination: Home Equipment Recommended: To be determined  Skilled Therapeutic Intervention Evaluation completed (see details above and below) with education on PT POC and goals and individual treatment initiated with focus on bed mobility, transfer training, activity tolerance, and education regarding daily therapy schedule, weekly team meetings, purpose of PT evaluation, and other CIR information. Pt received supine in bed and agreeable to therapy session though lethargic while in supine, becomes more alert once EOB. Pt reports she didn't sleep well last night and woke up feeling nauseous. Reports "to me it is a 10" referring to the pain in B toes and ball of feet described as a "dull ache." Supine>sitting L EOB, no bedrails and HOB flat, with mod assist for trunk upright - reports low back pain. Pt reports she has had pain in the balls of her feet but has been able to transfer to the chair so attempted sit>stand EOB>HHA with mod/max assist for lifting and upon coming to stand pt noted to be keeping her weight back on her heels and she shouted out in pain and started to become tearful. Returned to sitting and provided emotional support, repositioning, and rest for pain management. Deferred further weightbearing at this time. Sit>supine, HOB flat and not using bedrails, with min assist for B LE management. Therapist retrieved shorter wheelchair with improved size and cushion to improve upright OOB activity tolerance and provide pressure relief. Educated pt on use of transfer board for lateral scoot transfers bed<>chair  to allow mobility with limited WBing into feet. R lateral scoot to w/c with total assist for board placement and max assist for scooting hips slightly uphill with max cuing for sequencing and use of UEs to assist with movement. Educated pt on CIR and provided change of scenery for pain management while showing  her therapy gym. L lateral scoot w/c>EOB with total assist for board placement and max assist for scooting slightly downhill with pt having trunk LOB backwards requiring assist to maintain upright. Sitting EOB able to minimally scoot hips backwards for improved position in bed with min assist. Sit>supine with min assist. Pt left supine in bed with needs in reach and bed alarm on. Therapist educated nursing staff on pain in pt's feet and recommended she only perform transfers with therapy at this time based on current level of assist and increased risk for falls.   PT Evaluation Precautions/Restrictions Precautions Precautions: Fall Restrictions Weight Bearing Restrictions: No Pain Pain Assessment Pain Scale: 0-10 Pain Score: 10-Worst pain ever Faces Pain Scale: Hurts little more Pain Type: Acute pain (reports onset started w/ CVA) Pain Location: Foot Pain Orientation: Right;Left Pain Descriptors / Indicators: Aching;Dull Pain Frequency: Constant Pain Onset: On-going Patients Stated Pain Goal: 4 Pain Intervention(s): Medication (See eMAR);Rest;Distraction;Relaxation;Repositioned Multiple Pain Sites: No Home Living/Prior Functioning Home Living Available Help at Discharge: Family;Available 24 hours/day (daughters Kaneisha and Anna) Type of Home: Mobile home Home Access: Stairs to enter [employer] of Steps: 3 Entrance Stairs-Rails: Left Home Layout: One level  Lives With: Spouse (husband works full time) Prior Function Level of Independence: Independent with gait;Requires assistive device for independence;Independent with transfers (used cane and sometimes a RW)  Able  to Take Stairs?: Yes Driving: Yes Comments: used cane pta Perception  Perception Perception: Impaired Inattention/Neglect: Does not attend to left side of body;Does not attend to left visual field Praxis Praxis: Impaired Praxis Impairment Details: Motor planning  Cognition  Overall Cognitive Status: Impaired/Different from baseline Arousal/Alertness: Lethargic Orientation Level: Oriented X4 Attention: Focused;Sustained Focused Attention: Appears intact Sustained Attention: Appears intact Problem Solving: Impaired Safety/Judgment: Appears intact Sensation Sensation Light Touch: Impaired Detail Peripheral sensation comments: decreased light touch L LE especially distally but pt lethargic with potentially inconsistent reponses Light Touch Impaired Details: Impaired LLE Hot/Cold: Not tested Proprioception: Impaired by gross assessment (to be further assessed) Stereognosis: Not tested Coordination Gross Motor Movements are Fluid and Coordinated: No Coordination and Movement Description: impaired due to L inattention, impaired motor planning, and pain in bilateral feet Heel Shin Test: slow speed of movement bilaterally with pt reporting onset of low back pain during this task limiting participation Motor  Motor Motor: Other (comment) Motor - Skilled Clinical Observations: L hemiparesis  Mobility Bed Mobility Bed Mobility: Supine to Sit;Sit to Supine Supine to Sit: Moderate Assistance - Patient 50-74% Sit to Supine: Minimal Assistance - Patient > 75% Transfers Transfers: Sit to Stand;Stand to Sit;Lateral/Scoot Transfers Sit to Stand: Maximal Assistance - Patient 25-49% Stand to Sit: Maximal Assistance - Patient 25-49% Lateral/Scoot Transfers: Maximal Assistance - Patient 25-49% Transfer (Assistive device): Other (Comment) (transfer board) Locomotion  Gait Ambulation: No Gait Gait: No Stairs / Additional Locomotion Stairs: No Wheelchair Mobility Wheelchair Mobility: No   Trunk/Postural Assessment  Cervical Assessment Cervical Assessment: Exceptions to [facility] (forward head) Thoracic Assessment Thoracic Assessment: Exceptions to [facility] (kyphotic) Lumbar Assessment Lumbar Assessment: Exceptions to [facility] (posterior pelvic tilt in sitting) Postural Control Postural Control: Deficits on evaluation (to be further assessed with progression of mobility)  Balance Balance Balance Assessed: Yes Static Sitting Balance Static Sitting - Balance Support: Feet supported;Bilateral upper extremity supported Static Sitting - Level of Assistance: 5: Stand by assistance Dynamic Sitting Balance Dynamic Sitting - Balance Support: Feet supported;Bilateral upper extremity supported Dynamic Sitting - Level of Assistance: 4: Min assist Extremity Assessment      RLE  Assessment RLE Assessment: Within Functional Limits Active Range of Motion (AROM) Comments: WFL General Strength Comments: grossly 5/5 demonstrated during seated assessment; however, unable to provide resistance of ankle PF/DF due to pain LLE Assessment LLE Assessment: Exceptions to [facility] LLE Strength Left Hip Flexion: 4-/5 Left Knee Flexion: 4-/5 Left Knee Extension: 4-/5 Left Ankle Dorsiflexion: 3+/5 (only able to apply minimal resistance due to pain) Left Ankle Plantar Flexion: 3/5 (unable to provide resistance due to pain)    Refer to Care Plan for Long Term Goals  Recommendations for other services: None   Discharge Criteria: Patient will be discharged from PT if patient refuses treatment 3 consecutive times without medical reason, if treatment goals not met, if there is a change in medical status, if patient makes no progress towards goals or if patient is discharged from hospital.  The above assessment, treatment plan, treatment alternatives and goals were discussed and mutually agreed upon: by patient  Tawana Scale, PT, DPT 10/06/2019, 8:01 AM

## 2019-10-06 NOTE — Progress Notes (Signed)
[facility] PHYSICAL MEDICINE & REHABILITATION PROGRESS NOTE   Subjective/Complaints:  C/o leg pain to nursing but not focused on that now.  Pt tearful states she is worried about not walking.  Discussed purpose of rehab Undergoing SLP eval - oriented to person and place  Cannot describe leg pain well, no numbness and tingling, states it is "dull" but a "9"  ROS- no CP, SOB, no N/V?D   Objective:   No results found. Recent Labs    10/06/19 0541  WBC 9.8  HGB 14.4  HCT 43.9  PLT 304   Recent Labs    10/04/19 0804 10/06/19 0541  NA 138 134*  K 4.1 4.6  CL 105 104  CO2 24 23  GLUCOSE 114* 110*  BUN 8 17  CREATININE 0.61 0.79  CALCIUM 9.0 9.2    Intake/Output Summary (Last 24 hours) at 10/06/2019 5093 Last data filed at 10/05/2019 1900 Gross per 24 hour  Intake 120 ml  Output --  Net 120 ml     Physical Exam: Vital Signs Blood pressure (!) 153/70, pulse 93, temperature 98.2 F (36.8 C), temperature source Oral, resp. rate 16, SpO2 96 %.   General: No acute distress Mood and affect are appropriate Heart: Regular rate and rhythm no rubs murmurs or extra sounds Lungs: Clear to auscultation, breathing unlabored, no rales or wheezes Abdomen: Positive bowel sounds, soft nontender to palpation, nondistended Extremities: No clubbing, cyanosis, or edema, pedal pulses normal no skin lesions, no calf tenderness neg [place] Skin: No evidence of breakdown, no evidence of rash Neurologic: Cranial nerves II through XII intact, motor strength is 5/5 in bilateral deltoid, bicep, tricep, grip, hip flexor, knee extensors, ankle dorsiflexor and plantar flexor Sensory exam normal sensation to light touch bilateral upper and lower extremities but states LUE feels different than LLE  Cerebellar exam normal finger to nose to finger  bilateral upper  Musculoskeletal: Full range of motion in all 4 extremities. No joint swelling    Assessment/Plan: 1. Functional deficits secondary to  Right thalamic  which require 3+ hours per day of interdisciplinary therapy in a comprehensive inpatient rehab setting.  Physiatrist is providing close team supervision and 24 hour management of active medical problems listed below.  Physiatrist and rehab team continue to assess barriers to discharge/monitor patient progress toward functional and medical goals  Care Tool:  Bathing        Body parts bathed by helper: Buttocks, Front perineal area     Bathing assist Assist Level: Dependent - Patient 0%     Upper Body Dressing/Undressing Upper body dressing   What is the patient wearing?: Hospital gown only    Upper body assist Assist Level: Dependent - Patient 0%    Lower Body Dressing/Undressing Lower body dressing      What is the patient wearing?: Incontinence brief     Lower body assist Assist for lower body dressing: Dependent - Patient 0%     Toileting Toileting    Toileting assist Assist for toileting: Maximal Assistance - Patient 25 - 49%     Transfers Chair/bed transfer  Transfers assist     Chair/bed transfer assist level: 2 Helpers     Locomotion Ambulation   Ambulation assist              Walk 10 feet activity   Assist           Walk 50 feet activity   Assist  Walk 150 feet activity   Assist           Walk 10 feet on uneven surface  activity   Assist           Wheelchair     Assist               Wheelchair 50 feet with 2 turns activity    Assist            Wheelchair 150 feet activity     Assist          Blood pressure (!) 153/70, pulse 93, temperature 98.2 F (36.8 C), temperature source Oral, resp. rate 16, SpO2 96 %.  Medical Problem List and Plan: 1.  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  Antithrombotics: -DVT/anticoagulation: Eliquis              -antiplatelet therapy: N/A 3. Pain Management: Oxycodone and Robaxin as needed- has been c/o her feet hurting intermittently 4. Mood: Melatonin 3 mg nightly             -antipsychotic agents: N/A 5. Neuropsych: This patient is capable of making decisions on her own behalf. 6. Skin/Wound Care: Routine skin checks 7. Fluids/Electrolytes/Nutrition: Routine in and outs with follow-up chemistries 8.  LV apical thrombus.  Continue Eliquis as directed.  Follow-up outpatient Dr. Doylene Canard cardiology services plan repeat echocardiogram 3 months 9.  Hyperlipidemia.  Lipitor 10.  Restless leg syndrome.  Requip as needed- has significant Sx's-  11.  History of lumbar decompression complicated by wound dehiscence 2017.  Patient used assistive device prior to admission. 12. Hypokalemia- improved to 4.1- was 2.3 on admission with significant dehydration that has resolved.   13.  LE discomfort but neurovascular exam is normal 14.  Borderline diabetes, suspect pt may have neuropathy as well. Will start low dose Gabapentin at noc  LOS: 1 days A FACE TO FACE EVALUATION WAS PERFORMED  Charlett Blake 10/06/2019, 8:21 AM

## 2019-10-06 NOTE — Evaluation (Signed)
Speech Language Pathology Assessment and Plan  Patient Details  Name: [name] Good MRN: [id number] Date of Birth: July 01, 1950  SLP Diagnosis: Cognitive Impairments  Rehab Potential: Good ELOS: ~3 weeks    Today's Date: 10/06/2019 SLP Individual Time: 0725-0821 SLP Individual Time Calculation (min): 56 min   Problem List:  Patient Active Problem List   Diagnosis Date Noted  . Right thalamic infarction ([place]) 10/05/2019  . Severe dehydration 10/02/2019  . Grief reaction 10/02/2019  . RLS (restless legs syndrome) 10/02/2019  . Acute CVA (cerebrovascular accident) ([place]) 10/01/2019  . Hypokalemia 10/01/2019  . Cellulitis of left upper extremity 10/18/2017  . Arthritis 10/18/2017  . Septic shock ([place]) 10/16/2017  . Former smoker 11/13/2016  . History of restless legs syndrome 11/13/2016  . Nontraumatic incomplete tear of right rotator cuff 08/18/2016  . Autoimmune disease ([place]) 08/12/2016  . Primary osteoarthritis of both knees 08/12/2016  . Primary osteoarthritis of both feet 08/12/2016  . Obesity (BMI 35.0-39.9 without comorbidity) 08/12/2016  . Osteoarthritis of right hip 10/25/2015  . 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 S/P lumbar laminectomy 06/14/2015   Past Medical History:  Past Medical History:  Diagnosis Date  . Anxiety   . Arthritis   . Depression   . GERD (gastroesophageal reflux disease)   . Myocardial infarction ([place]) 91   no visits to cardiac dr(thomas kelly) since 92  . Stroke ([place])   . Wound dehiscence    lumbar   Past Surgical History:  Past Surgical History:  Procedure Laterality Date  . APPENDECTOMY     17 yrs  . BACK SURGERY    . BREAST SURGERY Left    cyst  . CARDIAC CATHETERIZATION  91  . CHOLECYSTECTOMY     69 yrs old  . LUMBAR LAMINECTOMY/DECOMPRESSION MICRODISCECTOMY N/A 06/14/2015   Procedure: Lumbar Three-Four,Lumbar Four-Five, Lumbar Five-Sacral One Laminectomy;  Surgeon: Eustace Moore, MD;  Location: [facility]  [facility];  Service: Neurosurgery;  Laterality: N/A;  . LUMBAR WOUND DEBRIDEMENT N/A 07/24/2015   Procedure: lumbar wound revision;  Surgeon: Eustace Moore, MD;  Location: [facility] NEURO ORS;  Service: Neurosurgery;  Laterality: N/A;  . TOTAL HIP ARTHROPLASTY Right 10/25/2015   Procedure: RIGHT TOTAL HIP ARTHROPLASTY ANTERIOR APPROACH;  Surgeon: Mcarthur Rossetti, MD;  Location: [facility];  Service: Orthopedics;  Laterality: Right;  . TUBAL LIGATION      Assessment / Plan / Recommendation Clinical Impression   HPI: [name] Good is a 69 year old right-handed female with history of depression, CAD, lumbar spine fusion complicated by wound dehiscence 2017, RLS, quit smoking 6 years ago.  Per chart review lives with spouse independent with assistive device.  Mobile home 3 steps to entry.  Presented 10/01/2019 with left-sided weakness and slurred speech.  Cranial CT scan negative for acute changes.  Chronic right parieto-occipital infarction.  CT angiogram of head and neck negative for emergent large vessel occlusion.  MRI of the brain showed a 13 mm acute ischemic nonhemorrhagic right thalamic infarction.  Echocardiogram with ejection fraction of 73% grade 1 diastolic dysfunction as well as LV thrombus.  Admission chemistries with potassium 2.3, creatinine 1.08.  Initially maintained on aspirin and Plavix for CVA prophylaxis changed to Eliquis after findings of LV thrombus.  Plans to follow-up cardiology services Dr. Doylene Canard in regards to LV apical thrombus with repeat echocardiogram in 3 months.  Tolerating a regular diet.  Pt admitted to CIR 10/05/19 and SLP evaluation was completed 10/06/19 with results as follows:  Pt was somewhat limited by fluctuating levels of fatigue throughout evaluation, however today her cognitive impairments had a moderate impact on basic daily functioning. Pt was oriented X4. She demonstrated good awareness of functional errors, however limited ability to problem solve basic situations and  recall new information. She was also intermittently labile and cognitive processing was generally slowed. She reports feeling "slow and fuzzy" but required cues to identify specific impairments. Pt scored 17/30 on MOCA version 7.1 (score 26 or > = WNL). Her speech was 100% intelligible and expressive/receptive language skills were WNL throughout assessment.   Recommend pt receive skilled ST services while inpatient to address above mentioned cognitive deficits in order to maximize her safety and functional independence with ADLs prior to discharge.    Skilled Therapeutic Interventions          Cognitive-linguistic evaluation was administered and results were reviewed with pt (please see above for details regarding results).    SLP Assessment  Patient will need skilled [place] [facility] Services during CIR admission    Recommendations  Patient destination: Home Follow up Recommendations: 24 hour supervision/assistance;Outpatient SLP;Home Health SLP    SLP Frequency 3 to 5 out of 7 days   SLP Duration  SLP Intensity  SLP Treatment/Interventions ~3 weeks  Minumum of 1-2 x/day, 30 to 90 minutes  Cognitive remediation/compensation;Cueing hierarchy;Functional tasks;Therapeutic Activities;Patient/family education;Internal/external aids    Pain Pain Assessment Pain Scale: Faces Pain Score: 8  Faces Pain Scale: Hurts little more Pain Type: Acute pain Pain Location: Foot Pain Orientation: Right;Left Pain Descriptors / Indicators: Dull Pain Frequency: Constant Pain Onset: On-going Patients Stated Pain Goal: 4 Pain Intervention(s): MD notified (Comment) Multiple Pain Sites: No      SLP Evaluation Cognition Overall Cognitive Status: Impaired/Different from baseline Arousal/Alertness: Lethargic Orientation Level: Oriented X4 Attention: Alternating Sustained Attention: Appears intact Alternating Attention: Impaired Alternating Attention Impairment: Verbal basic Memory:  Impaired Memory Impairment: Storage deficit;Decreased recall of new information;Decreased short term memory Decreased Short Term Memory: Verbal basic;Functional complex Problem Solving: Impaired Problem Solving Impairment: Verbal basic;Functional basic Executive Function: Organizing;Self Correcting Organizing: Impaired Organizing Impairment: Verbal basic;Functional basic Self Correcting: Impaired Self Correcting Impairment: Functional basic;Verbal complex Safety/Judgment: Appears intact  Comprehension Auditory Comprehension Overall Auditory Comprehension: Appears within functional limits for tasks assessed Yes/No Questions: Within Functional Limits Commands: Within Functional Limits Conversation: Complex Visual Recognition/Discrimination Discrimination: Not tested Reading Comprehension Reading Status: Not tested Expression Expression Primary Mode of Expression: Verbal Verbal Expression Overall Verbal Expression: Appears within functional limits for tasks assessed Written Expression Written Expression: Not tested Oral Motor Oral Motor/Sensory Function Overall Oral Motor/Sensory Function: Mild impairment Facial Symmetry: Abnormal symmetry left Motor Speech Overall Motor Speech: Appears within functional limits for tasks assessed Phonation: Normal Intelligibility: Intelligible Motor Planning: Witnin functional limits Motor Speech Errors: Not applicable    Intelligibility: Intelligible  Short Term Goals: Week 1: SLP Short Term Goal 1 (Week 1): Pt will demonstrate ability to problem solving functional basic situations with Min A verbal/visual cues. SLP Short Term Goal 2 (Week 1): Pt will recall new and daily information with Min A cues for use of compensatory aids/strategies. SLP Short Term Goal 3 (Week 1): Pt will alternate attention between functional tasks with Min A verbal cues. SLP Short Term Goal 4 (Week 1): Pt will anticipate 2 ADLs she will require assistance with  upon her return home with Mod A cues.  Refer to Care Plan for Long Term Goals  Recommendations for other services: Neuropsych  Discharge Criteria: Patient will  be discharged from SLP if patient refuses treatment 3 consecutive times without medical reason, if treatment goals not met, if there is a change in medical status, if patient makes no progress towards goals or if patient is discharged from hospital.  The above assessment, treatment plan, treatment alternatives and goals were discussed and mutually agreed upon: by patient  Arbutus Leas 10/06/2019, 8:27 AM

## 2019-10-06 NOTE — Progress Notes (Signed)
Inpatient Rehabilitation Care Coordinator Assessment and Plan  Patient Details  Name: [name] Good MRN: [id number] Date of Birth: 1951-04-07  Today's Date: 10/06/2019  Problem List:  Patient Active Problem List   Diagnosis Date Noted  . Right thalamic infarction ([place]) 10/05/2019  . Severe dehydration 10/02/2019  . Grief reaction 10/02/2019  . RLS (restless legs syndrome) 10/02/2019  . Acute CVA (cerebrovascular accident) ([place]) 10/01/2019  . Hypokalemia 10/01/2019  . Cellulitis of left upper extremity 10/18/2017  . Arthritis 10/18/2017  . Septic shock ([place]) 10/16/2017  . Former smoker 11/13/2016  . History of restless legs syndrome 11/13/2016  . Nontraumatic incomplete tear of right rotator cuff 08/18/2016  . Autoimmune disease ([place]) 08/12/2016  . Primary osteoarthritis of both knees 08/12/2016  . Primary osteoarthritis of both feet 08/12/2016  . Obesity (BMI 35.0-39.9 without comorbidity) 08/12/2016  . Osteoarthritis of right hip 10/25/2015  . 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 S/P lumbar laminectomy 06/14/2015   Past Medical History:  Past Medical History:  Diagnosis Date  . Anxiety   . Arthritis   . Depression   . GERD (gastroesophageal reflux disease)   . Myocardial infarction ([place]) 91   no visits to cardiac dr(thomas kelly) since 92  . Stroke ([place])   . Wound dehiscence    lumbar   Past Surgical History:  Past Surgical History:  Procedure Laterality Date  . APPENDECTOMY     17 yrs  . BACK SURGERY    . BREAST SURGERY Left    cyst  . CARDIAC CATHETERIZATION  91  . CHOLECYSTECTOMY     69 yrs old  . LUMBAR LAMINECTOMY/DECOMPRESSION MICRODISCECTOMY N/A 06/14/2015   Procedure: Lumbar Three-Four,Lumbar Four-Five, Lumbar Five-Sacral One Laminectomy;  Surgeon: Eustace Moore, MD;  Location: [facility] NEURO ORS;  Service: Neurosurgery;  Laterality: N/A;  . LUMBAR WOUND DEBRIDEMENT N/A 07/24/2015   Procedure: lumbar wound revision;  Surgeon: Eustace Moore, MD;  Location: [facility] NEURO ORS;  Service: Neurosurgery;  Laterality: N/A;  . TOTAL HIP ARTHROPLASTY Right 10/25/2015   Procedure: RIGHT TOTAL HIP ARTHROPLASTY ANTERIOR APPROACH;  Surgeon: Mcarthur Rossetti, MD;  Location: [facility];  Service: Orthopedics;  Laterality: Right;  . TUBAL LIGATION     Social History:  reports that she quit smoking about 6 years ago. Her smoking use included cigarettes. She has a 80.00 pack-year smoking history. She has never used smokeless tobacco. She reports previous alcohol use. She reports that she does not use drugs.  Family / Support Systems Patient Roles: Spouse Spouse/Significant Other: [name] Good Children: 1 Anticipated Caregiver: Daughter during day and possibly nights as well Ability/Limitations of Caregiver: Daughter plans to return to work in August Caregiver Availability: 24/7  Social History Preferred language: English Religion: Methodist Read: Yes Write: Yes Employment Status: Retired   Abuse/Neglect Abuse/Neglect Assessment Can Be Completed: Yes Physical Abuse: Denies Verbal Abuse: Denies Sexual Abuse: Denies Exploitation of patient/patient's resources: Denies Self-Neglect: Denies  Emotional Status Pt's affect, behavior and adjustment status: daughter reports improved moods Psychiatric History: no Substance Abuse History: no  Patient / Family Perceptions, Expectations & Goals Pt/Family understanding of illness & functional limitations: yes Pt/family expectations/goals: Goal to discharge back home  [place]: None Premorbid Home Care/DME Agencies: None Transportation available at discharge: Family able to trasport  Discharge Planning Living Arrangements: Spouse/significant other, Children Support Systems: Spouse/significant other, Children Type of Residence: Private residence (4 Steps, with railings) Insurance Resources: Multimedia programmer (specify) Secretary/administrator) Money Management: Patient,  Spouse Does the patient have any problems obtaining your medications?: No Care Coordinator Anticipated Follow Up Needs: HH/OP Expected length of stay: 10-13 Days  Clinical Impression Sw entered room, daughter a bedside. Both pleasant. Daughter assisting with meal. SW introduced self, explained role and process. Addressed questions and concerns. Sw will continue to follow up.   Andria Rhein 10/06/2019, 1:18 PM

## 2019-10-06 NOTE — IPOC Note (Addendum)
Overall Plan of Care [facility]) Patient Details Name: [name] Good MRN: [id number] DOB: [birth year]  Admitting Diagnosis: Right thalamic infarction [facility])  Hospital Problems: Principal Problem:   Right thalamic infarction [facility])     Functional Problem List: Nursing Bladder, Bowel, Edema, Endurance, Medication Management, Motor, Nutrition, Pain, Perception, Safety, Sensory, Skin Integrity  PT Balance, Perception, Behavior, Safety, Edema, Sensory, Endurance, Skin Integrity, Motor, Pain, Nutrition  OT Balance, Cognition, Endurance, Pain, Safety, Vision  SLP Cognition  TR         Basic ADL's: OT Bathing, Dressing, Toileting, Grooming     Advanced  ADL's: OT Simple Meal Preparation, Laundry     Transfers: PT Bed Mobility, Bed to Chair, Car, Occupational psychologist, Research scientist (life sciences): PT Ambulation, Stairs     Additional Impairments: OT None  SLP Social Cognition   Problem Solving, Memory, Awareness  TR      Anticipated Outcomes Item Anticipated Outcome  Self Feeding    Swallowing      Basic self-care  S - CGA  Toileting  supervision   Bathroom Transfers supervision- CGA  Bowel/Bladder  patinet to be min I assist with toileting  Transfers  supervision  Locomotion  CGA using LRAD  Communication     Cognition  Supervision-Mod I  Pain  Pain goal of 4 on pain scale of 0-10  Safety/Judgment  Min I assist wth safety and judgement   Therapy Plan: PT Intensity: Minimum of 1-2 x/day ,45 to 90 minutes PT Frequency: 5 out of 7 days PT Duration Estimated Length of Stay: ~3 weeks OT Intensity: Minimum of 1-2 x/day, 45 to 90 minutes OT Frequency: 5 out of 7 days OT Duration/Estimated Length of Stay: 14-19 days SLP Intensity: Minumum of 1-2 x/day, 30 to 90 minutes SLP Frequency: 3 to 5 out of 7 days SLP Duration/Estimated Length of Stay: ~3 weeks   Due to the current state of emergency, patients may not be receiving their 3-hours of Medicare-mandated  therapy.   Team Interventions: Nursing Interventions Patient/Family Education, Disease Management/Prevention, Skin Care/Wound Management, Discharge Planning, Bladder Management, Pain Management, Cognitive Remediation/Compensation, Psychosocial Support, Medication Management  PT interventions Ambulation/gait training, Community reintegration, DME/adaptive equipment instruction, Neuromuscular re-education, Psychosocial support, Stair training, UE/LE Strength taining/ROM, Wheelchair propulsion/positioning, Warden/ranger, Discharge planning, Functional electrical stimulation, Pain management, Skin care/wound management, Therapeutic Activities, UE/LE Coordination activities, Cognitive remediation/compensation, Disease management/prevention, Functional mobility training, Patient/family education, Splinting/orthotics, Therapeutic Exercise, Visual/perceptual remediation/compensation  OT Interventions Warden/ranger, DME/adaptive equipment instruction, Patient/family education, Therapeutic Activities, Cognitive remediation/compensation, Psychosocial support, Therapeutic Exercise, Community reintegration, Functional mobility training, UE/LE Strength taining/ROM, Self Care/advanced ADL retraining, Discharge planning, Neuromuscular re-education, UE/LE Coordination activities, Pain management, Visual/perceptual remediation/compensation  SLP Interventions Cognitive remediation/compensation, Cueing hierarchy, Functional tasks, Therapeutic Activities, Patient/family education, Internal/external aids  TR Interventions    SW/CM Interventions Discharge Planning, Psychosocial Support, Patient/Family Education   Barriers to Discharge MD  Medical stability  Nursing      PT Inaccessible home environment, Decreased caregiver support, Home environment access/layout    OT Other (comments) none known at this time  SLP      SW       Team Discharge Planning: Destination: PT-Home ,OT- Home ,  SLP-Home Projected Follow-up: PT-Home health PT, 24 hour supervision/assistance, OT-  24 hour supervision/assistance, Home health OT, SLP-24 hour supervision/assistance, Outpatient SLP, Home Health SLP Projected Equipment Needs: PT-To be determined, OT- To be determined, SLP-  Equipment Details: PT- , OT-  Patient/family involved in discharge planning: PT-  Patient,  OT-Patient, SLP-Patient  MD ELOS: 14-18d Medical Rehab Prognosis:  Good Assessment:  69 year old right-handed female with history of depression, CAD, lumbar spine fusion complicated by wound dehiscence 2017, RLS, quit smoking 6 years ago.  Per chart review lives with spouse independent with assistive device.  Mobile home 3 steps to entry.  Presented 10/01/2019 with left-sided weakness and slurred speech.  Cranial CT scan negative for acute changes.  Chronic right parieto-occipital infarction.  CT angiogram of head and neck negative for emergent large vessel occlusion.  MRI of the brain showed a 13 mm acute ischemic nonhemorrhagic right thalamic infarction.  Echocardiogram with ejection fraction of 01% grade 1 diastolic dysfunction as well as LV thrombus.  Admission chemistries with potassium 2.3, creatinine 1.08.  Initially maintained on aspirin and Plavix for CVA prophylaxis changed to Eliquis after findings of LV thrombus.  Plans to follow-up cardiology services Dr. Doylene Canard in regards to LV apical thrombus with repeat echocardiogram in 3 months.  Tolerating a regular diet.  Therapy evaluations completed and patient was admitted for a comprehensive rehab program.    See Team Conference Notes for weekly updates to the plan of care

## 2019-10-07 ENCOUNTER — Inpatient Hospital Stay (HOSPITAL_COMMUNITY): Payer: Medicare Other

## 2019-10-07 ENCOUNTER — Inpatient Hospital Stay (HOSPITAL_COMMUNITY): Payer: Medicare Other | Admitting: Occupational Therapy

## 2019-10-07 DIAGNOSIS — M792 Neuralgia and neuritis, unspecified: Secondary | ICD-10-CM

## 2019-10-07 MED ORDER — GABAPENTIN 100 MG PO CAPS
200.0000 mg | ORAL_CAPSULE | Freq: Every day | ORAL | Status: DC
  Administered 2019-10-07: 200 mg via ORAL
  Filled 2019-10-07: qty 2

## 2019-10-07 NOTE — Progress Notes (Signed)
[place] PHYSICAL MEDICINE & REHABILITATION PROGRESS NOTE   Subjective/Complaints:  Says that bilateral feet are stinging/burning. Didn't sleep well last night because of them  ROS: Patient denies fever, rash, sore throat, blurred vision, nausea, vomiting, diarrhea, cough, shortness of breath or chest pain,  headache, or mood change.   Objective:   No results found. Recent Labs    10/06/19 0541  WBC 9.8  HGB 14.4  HCT 43.9  PLT 304   Recent Labs    10/06/19 0541  NA 134*  K 4.6  CL 104  CO2 23  GLUCOSE 110*  BUN 17  CREATININE 0.79  CALCIUM 9.2    Intake/Output Summary (Last 24 hours) at 10/07/2019 1050 Last data filed at 10/07/2019 0700 Gross per 24 hour  Intake 222 ml  Output 100 ml  Net 122 ml     Physical Exam: Vital Signs Blood pressure 139/65, pulse 80, temperature 98.2 F (36.8 C), resp. rate 18, SpO2 94 %.   Constitutional: No distress . Vital signs reviewed. HEENT: EOMI, oral membranes moist Neck: supple Cardiovascular: RRR without murmur. No JVD    Respiratory/Chest: CTA Bilaterally without wheezes or rales. Normal effort    GI/Abdomen: BS +, non-tender, non-distended Ext: no clubbing, cyanosis, or edema Psych: pleasant sl anxious Skin: No evidence of breakdown, no evidence of rash Neurologic: Cranial nerves II through XII intact, motor strength is 5/5 in bilateral deltoid, bicep, tricep, grip, hip flexor, knee extensors, ankle dorsiflexor and plantar flexor Sensory exam normal sensation to light touch bilateral upper and lower extremities but states LUE feels different than LLE. Decreased LT in both feet? No hypersensitivity or allodynia  Cerebellar exam normal finger to nose to finger  bilateral upper  Musculoskeletal: Full range of motion in all 4 extremities. No joint swelling    Assessment/Plan: 1. Functional deficits secondary to Right thalamic  which require 3+ hours per day of interdisciplinary therapy in a comprehensive inpatient  rehab setting.  Physiatrist is providing close team supervision and 24 hour management of active medical problems listed below.  Physiatrist and rehab team continue to assess barriers to discharge/monitor patient progress toward functional and medical goals  Care Tool:  Bathing    Body parts bathed by patient: Right arm, Left arm, Chest, Abdomen, Right upper leg, Left upper leg, Face   Body parts bathed by helper: Front perineal area, Buttocks, Right lower leg, Left lower leg     Bathing assist Assist Level: Moderate Assistance - Patient 50 - 74%     Upper Body Dressing/Undressing Upper body dressing   What is the patient wearing?: Pull over shirt    Upper body assist Assist Level: Maximal Assistance - Patient 25 - 49%    Lower Body Dressing/Undressing Lower body dressing      What is the patient wearing?: Underwear/pull up, Pants     Lower body assist Assist for lower body dressing: Total Assistance - Patient < 25%     Toileting Toileting Toileting Activity did not occur Press photographer and hygiene only): Refused  Toileting assist Assist for toileting: Maximal Assistance - Patient 25 - 49%     Transfers Chair/bed transfer  Transfers assist  Chair/bed transfer activity did not occur: Safety/medical concerns (required edu/training on transfer board use)  Chair/bed transfer assist level: 2 Helpers     Locomotion Ambulation   Ambulation assist   Ambulation activity did not occur: Safety/medical concerns (unable due to pain limiting WBing on feet)  Walk 10 feet activity   Assist  Walk 10 feet activity did not occur: Safety/medical concerns        Walk 50 feet activity   Assist Walk 50 feet with 2 turns activity did not occur: Safety/medical concerns         Walk 150 feet activity   Assist Walk 150 feet activity did not occur: Safety/medical concerns         Walk 10 feet on uneven surface  activity   Assist Walk 10  feet on uneven surfaces activity did not occur: Safety/medical concerns         Wheelchair     Assist Will patient use wheelchair at discharge?:  (TBD)             Wheelchair 50 feet with 2 turns activity    Assist            Wheelchair 150 feet activity     Assist          Blood pressure 139/65, pulse 80, temperature 98.2 F (36.8 C), resp. rate 18, SpO2 94 %.  Medical Problem List and Plan: 1.  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  Antithrombotics: -DVT/anticoagulation: Eliquis             -antiplatelet therapy: N/A 3. Pain Management: Oxycodone and Robaxin as needed- has been c/o her feet hurting intermittently 4. Mood: Melatonin 3 mg nightly             -antipsychotic agents: N/A 5. Neuropsych: This patient is capable of making decisions on her own behalf. 6. Skin/Wound Care: Routine skin checks 7. Fluids/Electrolytes/Nutrition: Routine in and outs with follow-up chemistries 8.  LV apical thrombus.  Continue Eliquis as directed.  Follow-up outpatient Dr. Doylene Canard cardiology services plan repeat echocardiogram 3 months 9.  Hyperlipidemia.  Lipitor 10.  Restless leg syndrome.  Requip as needed- has significant Sx's-  11.  History of lumbar decompression complicated by wound dehiscence 2017.  Patient used assistive device prior to admission. 12. Hypokalemia- improved to 4.1- was 2.3 on admission with significant dehydration that has resolved.   13.  LE discomfort but neurovascular exam is normal 14.  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

## 2019-10-07 NOTE — Progress Notes (Signed)
Speech Language Pathology Daily Session Note  Patient Details  Name: [name] Good MRN: [id number] Date of Birth: 04-11-1951  Today's Date: 10/07/2019 SLP Individual Time: 4268-3419 SLP Individual Time Calculation (min): 43 min  Short Term Goals: Week 1: SLP Short Term Goal 1 (Week 1): Pt will demonstrate ability to problem solving functional basic situations with Min A verbal/visual cues. SLP Short Term Goal 2 (Week 1): Pt will recall new and daily information with Min A cues for use of compensatory aids/strategies. SLP Short Term Goal 3 (Week 1): Pt will alternate attention between functional tasks with Min A verbal cues. SLP Short Term Goal 4 (Week 1): Pt will anticipate 2 ADLs she will require assistance with upon her return home with Mod A cues.  Skilled Therapeutic Interventions: Skilled ST services focused on cognitive skills. SLP facilitated basic problem solving and recall in basic money management task (ALFA), pt required mod A verbal cues fading to supervision A verbal cues when given visual aid for recall and instructed to count aloud to aid in recall/attention. Pt's eyes were fluttering throughout session and pt expressed onset of fatigue, but was agreeable to continue participation. Pt completed medication management (ALFA.) Pt was able to locate information on medication label but required max A verbal cues to comprehend dosages/times per day. Pt was left in room with call bell within reach and bed alarm set. ST recommends to continue skilled ST services.      Pain Pain Assessment Pain Score: 0-No pain  Therapy/Group: Individual Therapy  Aidel Davisson  [facility] 10/07/2019, 3:51 PM

## 2019-10-07 NOTE — Progress Notes (Signed)
Occupational Therapy Session Note  Patient Details  Name: [name] Good MRN: [id number] Date of Birth: [birth year]  Today's Date: 10/07/2019 OT Individual Time: 6144-3154 OT Individual Time Calculation (min): 24 min  and Today's Date: 10/07/2019 OT Missed Time: 51 Minutes Missed Time Reason: Pain;Patient fatigue   Short Term Goals: Week 1:  OT Short Term Goal 1 (Week 1): Pt will perform LB dressing with mod A overall. OT Short Term Goal 2 (Week 1): Pt will perform toileting with mod A overall. OT Short Term Goal 3 (Week 1): Pt will perform toilet transfer with mod A overall. OT Short Term Goal 4 (Week 1): Pt will perform UB dressing with min A overall.  Skilled Therapeutic Interventions/Progress Updates:    Treatment session limited by pain in Rt foot > Lt foot. Pt tearful upon arrival stating that she had not been able to sleep overnight due to pain in bilateral feet.  Pt reports not receiving medicine for restless leg overnight and therefore unable to sleep.  Encouraged pt to change position to sitting at EOB to increase blood flow and mobility into BLE to attempt to decrease pain.  Pt completed bed mobility with min assist.  Engaged in grooming tasks seated EOB with setup.  Encouraged bilateral ankle pumps and toe curls to attempt to increase blood flow and decrease pain.  Pt stating Rt foot "so stiff".  Reports minimal decrease in stiffness and pain with movement.  Pt refused any standing or OOB activity.  Pt reports fatigue and requesting to return to bed at this time.  Pt missed remaining 51 mins due to pain in B feet and fatigue.  Therapy Documentation Precautions:  Precautions Precautions: Fall Restrictions Weight Bearing Restrictions: No General: General OT Amount of Missed Time: 51 Minutes Pain: Pain Assessment Pain Scale: 0-10 Pain Score: 7  Pain Type: Acute pain Pain Location: Foot Pain Orientation: Right;Left Pain Frequency: Constant Pain Onset: On-going Patients  Stated Pain Goal: 4 Pain Intervention(s): Medication (See eMAR)   Therapy/Group: Individual Therapy  Rosalio Loud 10/07/2019, 10:08 AM

## 2019-10-07 NOTE — Progress Notes (Signed)
Physical Therapy Session Note  Patient Details  Name: [name] Good MRN: [id number] Date of Birth: April 25, 1951  Today's Date: 10/07/2019 PT Individual Time: 2878-6767 PT Individual Time Calculation (min): 60 min    Short Term Goals: Week 1:  PT Short Term Goal 1 (Week 1): Pt will perform supine<>sit with min assist PT Short Term Goal 2 (Week 1): Pt will perform sit<>stand using LRAD with mod assist of 1 PT Short Term Goal 3 (Week 1): Pt will perform bed<>chair transfer using LRAD with mod assist of 1 PT Short Term Goal 4 (Week 1): Pt will ambulate at least 5ft using LRAD with mod assist of 1  Skilled Therapeutic Interventions/Progress Updates:    Pt supine in bed upon PT arrival, reports she feels tired and had a "bad night." Pt agreeable to therapy tx and willing to try getting out of bed. Pt denies pain at rest, reports 3/10 pain with static standing using RW and increased pain up to 8/10 when trying to take steps while standing - limited standing activity this session for pain management. Pt transferred to sitting EOB this session with min assist, slideboard transfer to the w/c with min assist (using slideboard to minimized LE weightbearing for pain management). Pt propelled w/c this session x 100 ft using B UE s with supervision and increased time to complete, cues for techniques. Pt performed x 4 sit<>stands throughout this session with RW and min-mod assist. While performing static standing with UE on RW, pt reports tolerable pain in balls of feet, she keeps her weight shifted onto heels - able to maintain static standing 1-2 min bouts. Pt attempted to take hand off RW however reports increased pain in feet, pt also tried stepping in place with each foot however too painful. Pt seated in w/c performed 2 x 10 seated LE exercises - hip flexion, LAQ and hip abduction with theraband. Pt worked on UE strength to Interior and spatial designer ball back and forth with 3# dowel, x 2 trials. Pt seated in w/c suddenly  reports sharp shooting pain into her toes, therapist instructed pt in breathing techniques for pain management. Pt transported back to room, slideboard transfer to bed with min assist and sit>supine min assist. Pt reports relief of pain with feet up in bed. Dicussed trying to work on sitting OOB this week as pain continues to improve/become tolerable. Pt left in bed with needs in reach and bed alarm set, missed 15 minutes of skilled therapy tx secondary to B foot pain.   Therapy Documentation Precautions:  Precautions Precautions: Fall Restrictions Weight Bearing Restrictions: No    Therapy/Group: Individual Therapy  Cresenciano Genre, PT, DPT, CSRS 10/07/2019, 2:39 PM

## 2019-10-08 MED ORDER — ALPRAZOLAM 0.25 MG PO TABS
0.2500 mg | ORAL_TABLET | Freq: Two times a day (BID) | ORAL | Status: DC | PRN
  Administered 2019-10-15 – 2019-10-16 (×3): 0.25 mg via ORAL
  Filled 2019-10-08 (×4): qty 1

## 2019-10-08 MED ORDER — TRAZODONE HCL 50 MG PO TABS
50.0000 mg | ORAL_TABLET | Freq: Every day | ORAL | Status: DC
  Administered 2019-10-08 – 2019-10-10 (×3): 50 mg via ORAL
  Filled 2019-10-08 (×3): qty 1

## 2019-10-08 MED ORDER — ROPINIROLE HCL 1 MG PO TABS
3.0000 mg | ORAL_TABLET | Freq: Two times a day (BID) | ORAL | Status: DC | PRN
  Administered 2019-10-08 – 2019-10-14 (×8): 3 mg via ORAL
  Filled 2019-10-08 (×9): qty 3

## 2019-10-08 MED ORDER — GABAPENTIN 300 MG PO CAPS
300.0000 mg | ORAL_CAPSULE | Freq: Every day | ORAL | Status: DC
  Administered 2019-10-08: 300 mg via ORAL
  Filled 2019-10-08: qty 1

## 2019-10-08 NOTE — Progress Notes (Signed)
Patient did not sleep well. Complained of restless legs, unable to keep legs still. PRN requip and prn percocet given at 1956. Slept from 2100-0030, Complained of feet burning. Requesting pain med before due, PRN percocet given at 0207. Tearful off and on since 0230. "I don't know why I'm crying." I keep waking up confused." Reports feeling depressed and took sertaline, "years ago", "I'm not sure I want to start taking it again." [name] Good A

## 2019-10-08 NOTE — Progress Notes (Signed)
[place] PHYSICAL MEDICINE & REHABILITATION PROGRESS NOTE   Subjective/Complaints:  Pt with numerous somatic complaints again last night with associated anxiety. Reports having used zoloft in the past  ROS: Patient denies fever, rash, sore throat, blurred vision, nausea, vomiting, diarrhea, cough, shortness of breath or chest pain     Objective:   No results found. Recent Labs    10/06/19 0541  WBC 9.8  HGB 14.4  HCT 43.9  PLT 304   Recent Labs    10/06/19 0541  NA 134*  K 4.6  CL 104  CO2 23  GLUCOSE 110*  BUN 17  CREATININE 0.79  CALCIUM 9.2    Intake/Output Summary (Last 24 hours) at 10/08/2019 0938 Last data filed at 10/08/2019 0850 Gross per 24 hour  Intake 1024 ml  Output --  Net 1024 ml     Physical Exam: Vital Signs Blood pressure 111/65, pulse 86, temperature 97.9 F (36.6 C), resp. rate 20, SpO2 94 %.   Constitutional: No distress . Vital signs reviewed. HEENT: EOMI, oral membranes moist Neck: supple Cardiovascular: RRR without murmur. No JVD    Respiratory/Chest: CTA Bilaterally without wheezes or rales. Normal effort    GI/Abdomen: BS +, non-tender, non-distended Ext: no clubbing, cyanosis, or edema Psych: anxious Skin: No evidence of breakdown, no evidence of rash Neurologic: Cranial nerves II through XII intact, motor strength is 5/5 in bilateral deltoid, bicep, tricep, grip, hip flexor, knee extensors, ankle dorsiflexor and plantar flexor Sensory exam normal sensation to light touch bilateral upper and lower extremities but states LUE feels different than LLE. Decreased LT in both feet? No hypersensitivity or allodynia  Cerebellar exam normal finger to nose to finger  bilateral upper  Musculoskeletal: Full range of motion in all 4 extremities. No jt/LE swelling    Assessment/Plan: 1. Functional deficits secondary to Right thalamic  which require 3+ hours per day of interdisciplinary therapy in a comprehensive inpatient rehab  setting.  Physiatrist is providing close team supervision and 24 hour management of active medical problems listed below.  Physiatrist and rehab team continue to assess barriers to discharge/monitor patient progress toward functional and medical goals  Care Tool:  Bathing  Bathing activity did not occur: Refused Body parts bathed by patient: Right arm, Left arm, Chest, Abdomen, Right upper leg, Left upper leg, Face   Body parts bathed by helper: Front perineal area, Buttocks, Right lower leg, Left lower leg     Bathing assist Assist Level: Moderate Assistance - Patient 50 - 74%     Upper Body Dressing/Undressing Upper body dressing Upper body dressing/undressing activity did not occur (including orthotics): Refused What is the patient wearing?: Pull over shirt    Upper body assist Assist Level: Maximal Assistance - Patient 25 - 49%    Lower Body Dressing/Undressing Lower body dressing    Lower body dressing activity did not occur: Refused What is the patient wearing?: Underwear/pull up, Pants     Lower body assist Assist for lower body dressing: Total Assistance - Patient < 25%     Toileting Toileting Toileting Activity did not occur Press photographer and hygiene only): Refused  Toileting assist Assist for toileting: Moderate Assistance - Patient 50 - 74%     Transfers Chair/bed transfer  Transfers assist  Chair/bed transfer activity did not occur: Refused  Chair/bed transfer assist level: Minimal Assistance - Patient > 75% (slideboard)     Locomotion Ambulation   Ambulation assist   Ambulation activity did not occur: Safety/medical concerns (unable  due to pain limiting WBing on feet)          Walk 10 feet activity   Assist  Walk 10 feet activity did not occur: Safety/medical concerns        Walk 50 feet activity   Assist Walk 50 feet with 2 turns activity did not occur: Safety/medical concerns         Walk 150 feet  activity   Assist Walk 150 feet activity did not occur: Safety/medical concerns         Walk 10 feet on uneven surface  activity   Assist Walk 10 feet on uneven surfaces activity did not occur: Safety/medical concerns         Wheelchair     Assist Will patient use wheelchair at discharge?:  (TBD) Type of Wheelchair: Manual    Wheelchair assist level: Supervision/Verbal cueing Max wheelchair distance: 100 ft    Wheelchair 50 feet with 2 turns activity    Assist        Assist Level: Supervision/Verbal cueing   Wheelchair 150 feet activity     Assist          Blood pressure 111/65, pulse 86, temperature 97.9 F (36.6 C), resp. rate 20, SpO2 94 %.  Medical Problem List and Plan: 1.  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  Antithrombotics: -DVT/anticoagulation: Eliquis             -antiplatelet therapy: N/A 3. Pain Management: Oxycodone and Robaxin as needed- has been c/o her feet hurting intermittently 4. Mood: Melatonin 3 mg nightly ineffective for sleep  -add trazodone scheduled 50mg  which may help sleep and mood  -increase gabapentin as below  -xanax prn for anxiety  -recommend neuropsych assessment             -antipsychotic agents: N/A 5. Neuropsych: This patient is capable of making decisions on her own behalf. 6. Skin/Wound Care: Routine skin checks 7. Fluids/Electrolytes/Nutrition: Routine in and outs with follow-up chemistries 8.  LV apical thrombus.  Continue Eliquis as directed.  Follow-up outpatient Dr. Doylene Canard cardiology services plan repeat echocardiogram 3 months 9.  Hyperlipidemia.  Lipitor 10.  Restless leg syndrome.  Requip as needed- has significant Sx's-  11.  History of lumbar decompression complicated by wound dehiscence 2017.  Patient used assistive device prior to admission. 12. Hypokalemia- improved to 4.1-  was 2.3 on admission with significant dehydration that has resolved.   13. LE discomfort but neurovascular exam is normal 14.  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

## 2019-10-09 ENCOUNTER — Inpatient Hospital Stay (HOSPITAL_COMMUNITY): Payer: Medicare Other | Admitting: Occupational Therapy

## 2019-10-09 ENCOUNTER — Inpatient Hospital Stay (HOSPITAL_COMMUNITY): Payer: Medicare Other | Admitting: Physical Therapy

## 2019-10-09 ENCOUNTER — Inpatient Hospital Stay (HOSPITAL_COMMUNITY): Payer: Medicare Other | Admitting: Speech Pathology

## 2019-10-09 MED ORDER — SENNOSIDES-DOCUSATE SODIUM 8.6-50 MG PO TABS
2.0000 | ORAL_TABLET | Freq: Two times a day (BID) | ORAL | Status: DC
  Administered 2019-10-09 – 2019-10-20 (×16): 2 via ORAL
  Filled 2019-10-09 (×25): qty 2

## 2019-10-09 MED ORDER — BISACODYL 10 MG RE SUPP
10.0000 mg | Freq: Every day | RECTAL | Status: DC | PRN
  Administered 2019-10-09: 10 mg via RECTAL
  Filled 2019-10-09: qty 1

## 2019-10-09 MED ORDER — GABAPENTIN 300 MG PO CAPS
300.0000 mg | ORAL_CAPSULE | Freq: Two times a day (BID) | ORAL | Status: DC
  Administered 2019-10-09 – 2019-10-17 (×17): 300 mg via ORAL
  Filled 2019-10-09 (×17): qty 1

## 2019-10-09 NOTE — Progress Notes (Signed)
Slept much better last night. At 1947 PRN percocet given for complaint of right burning foot pain. Refused scheduled xanax.  At 2317, PRN tylenol given for right great toe pain, mild erythema noted. LBM 06/10, refused sorbitol this AM. Small hard incontinent stool this AM. Incontinent of urine. "I don't want to use the bedpan anymore."  At 0622, PRN tylenol given for complaint of right foot pain. Alfredo Martinez A

## 2019-10-09 NOTE — Care Management (Signed)
Patient ID: [name] Good, female   DOB: 11/17/1950, 69 y.o.   MRN: [id number] Met with patient to review role of CM and collaboration with SW Margreta Journey) to facilitate smooth discharge. Discussed nursing concerns including statin administration, Eliquis and nutritional supplements and handouts given for eating after a stroke. Patient is tearful about situation and upset by recent passing of her mother which she believes the stress from taking care of her mother contributed to her stroke. Reviewed team conference schedule and continued follow up on nursing concerns.

## 2019-10-09 NOTE — Discharge Instructions (Addendum)
Inpatient Rehab Discharge Instructions  [place] Discharge date and time: No discharge date for patient encounter.   Activities/Precautions/ Functional Status: Activity: activity as tolerated Diet: regular diet Wound Care: none needed Functional status:  ___ No restrictions     ___ Walk up steps independently ___ 24/7 supervision/assistance   ___ Walk up steps with assistance ___ Intermittent supervision/assistance  ___ Bathe/dress independently ___ Walk with walker     _x__ Bathe/dress with assistance ___ Walk Independently    ___ Shower independently ___ Walk with assistance    ___ Shower with assistance ___ No alcohol     ___ Return to work/school ________ COMMUNITY REFERRALS UPON DISCHARGE:    Home Health:   PT     OT                    Agency: [facility] Phone:[phone]    Medical Equipment/Items Ordered: Conservation officer, nature, Tub Producer, television/film/video                                                 Agency/Supplier: [employer]  Special Instructions: No driving smoking or alcohol STROKE/TIA DISCHARGE INSTRUCTIONS SMOKING Cigarette smoking nearly doubles your risk of having a stroke & is the single most alterable risk factor  If you smoke or have smoked in the last 12 months, you are advised to quit smoking for your health.  Most of the excess cardiovascular risk related to smoking disappears within a year of stopping.  Ask you doctor about anti-smoking medications  [place] Quit Line: 1-800-QUIT NOW  Free Smoking Cessation Classes [phone]  CHOLESTEROL Know your levels; limit fat & cholesterol in your diet  Lipid Panel     Component Value Date/Time   CHOL 176 10/02/2019 0630   TRIG 102 10/02/2019 0630   HDL 38 (L) 10/02/2019 0630   CHOLHDL 4.6 10/02/2019 0630   VLDL 20 10/02/2019 0630   LDLCALC 118 (H) 10/02/2019 0630      Many patients benefit from treatment even if their cholesterol is at goal.  Goal: Total Cholesterol (CHOL) less than  160  Goal:  Triglycerides (TRIG) less than 150  Goal:  HDL greater than 40  Goal:  LDL (LDLCALC) less than 100   BLOOD PRESSURE American Stroke Association blood pressure target is less that 120/80 mm/Hg  Your discharge blood pressure is:  BP: (!) 153/70  Monitor your blood pressure  Limit your salt and alcohol intake  Many individuals will require more than one medication for high blood pressure  DIABETES (A1c is a blood sugar average for last 3 months) Goal HGBA1c is under 7% (HBGA1c is blood sugar average for last 3 months)  Diabetes: No known diagnosis of diabetes    Lab Results  Component Value Date   HGBA1C 5.7 (H) 10/02/2019     Your HGBA1c can be lowered with medications, healthy diet, and exercise.  Check your blood sugar as directed by your physician  Call your physician if you experience unexplained or low blood sugars.  PHYSICAL ACTIVITY/REHABILITATION Goal is 30 minutes at least 4 days per week  Activity: Increase activity slowly, Therapies: Physical Therapy: Home Health Return to work:   Activity decreases your risk of heart attack and stroke and makes your heart stronger.  It helps control your weight and blood pressure; helps you relax  and can improve your mood.  Participate in a regular exercise program.  Talk with your doctor about the best form of exercise for you (dancing, walking, swimming, cycling).  DIET/WEIGHT Goal is to maintain a healthy weight  Your discharge diet is:  Diet Order            Diet Heart Room service appropriate? Yes; Fluid consistency: Thin  Diet effective now                 liquids Your height is:    Your current weight is:   Your Body Mass Index (BMI) is:     Following the type of diet specifically designed for you will help prevent another stroke.  Your goal weight range is:    Your goal Body Mass Index (BMI) is 19-24.  Healthy food habits can help reduce 3 risk factors for stroke:  High cholesterol, hypertension,  and excess weight.  RESOURCES Stroke/Support Group:  Call [phone]   STROKE EDUCATION PROVIDED/REVIEWED AND GIVEN TO PATIENT Stroke warning signs and symptoms How to activate emergency medical system (call 911). Medications prescribed at discharge. Need for follow-up after discharge. Personal risk factors for stroke. Pneumonia vaccine given:  Flu vaccine given:  My questions have been answered, the writing is legible, and I understand these instructions.  I will adhere to these goals & educational materials that have been provided to me after my discharge from the hospital.      My questions have been answered and I understand these instructions. I will adhere to these goals and the provided educational materials after my discharge from the hospital.  Patient/Caregiver Signature _______________________________ Date __________  Clinician Signature _______________________________________ Date __________  Please bring this form and your medication list with you to all your follow-up doctor's appointments.   Information on my medicine - ELIQUIS (apixaban)  This medication education was reviewed with me or my healthcare representative as part of my discharge preparation.  The pharmacist that spoke with me during my hospital stay was:  Ulyses Southward, RPH-CPP  Why was Eliquis prescribed for you? Eliquis was prescribed to treat blood clots that may have been found in the veins of your legs (deep vein thrombosis) or in your lungs (pulmonary embolism) and to reduce the risk of them occurring again.  What do You need to know about Eliquis ? The starting dose is 10 mg (two 5 mg tablets) taken TWICE daily for the FIRST SEVEN (7) DAYS, then on (enter date)  10/10/19  the dose is reduced to ONE 5 mg tablet taken TWICE daily.  Eliquis may be taken with or without food.   Try to take the dose about the same time in the morning and in the evening. If you have difficulty swallowing the tablet  whole please discuss with your pharmacist how to take the medication safely.  Take Eliquis exactly as prescribed and DO NOT stop taking Eliquis without talking to the doctor who prescribed the medication.  Stopping may increase your risk of developing a new blood clot.  Refill your prescription before you run out.  After discharge, you should have regular check-up appointments with your healthcare provider that is prescribing your Eliquis.    What do you do if you miss a dose? If a dose of ELIQUIS is not taken at the scheduled time, take it as soon as possible on the same day and twice-daily administration should be resumed. The dose should not be doubled to make up for a missed  dose.  Important Safety Information A possible side effect of Eliquis is bleeding. You should call your healthcare provider right away if you experience any of the following: ? Bleeding from an injury or your nose that does not stop. ? Unusual colored urine (red or dark brown) or unusual colored stools (red or black). ? Unusual bruising for unknown reasons. ? A serious fall or if you hit your head (even if there is no bleeding).  Some medicines may interact with Eliquis and might increase your risk of bleeding or clotting while on Eliquis. To help avoid this, consult your healthcare provider or pharmacist prior to using any new prescription or non-prescription medications, including herbals, vitamins, non-steroidal anti-inflammatory drugs (NSAIDs) and supplements.  This website has more information on Eliquis (apixaban): http://www.eliquis.com/eliquis/home

## 2019-10-09 NOTE — Progress Notes (Signed)
Occupational Therapy Session Note  Patient Details  Name: [name] Good MRN: [id number] Date of Birth: Feb 03, 1951  Today's Date: 10/09/2019 OT Individual Time: 0950-1050 OT Individual Time Calculation (min): 60 min   Short Term Goals: Week 1:  OT Short Term Goal 1 (Week 1): Pt will perform LB dressing with mod A overall. OT Short Term Goal 2 (Week 1): Pt will perform toileting with mod A overall. OT Short Term Goal 3 (Week 1): Pt will perform toilet transfer with mod A overall. OT Short Term Goal 4 (Week 1): Pt will perform UB dressing with min A overall.  Skilled Therapeutic Interventions/Progress Updates:    Pt greeted in bed, reporting foot pain to be manageable and motivated to try standing during session. Very motivated to try to have a BM. OT brought pt a drop arm BSC in case slideboard transfer was more appropriate for pain mgt. Min A for sit<stand using RW for support, and pt able to pivot to [facility] while stepping using the heels of her feet. While sitting on BSC, educated pt on potty squat positioning and rocking techniques and also gave her warm prune juice to drink. Pt engaged in bathing/dressing tasks sit<stand from [facility] using RW, CGA-Min A for all stands using device. She needed assistance to wash feet and don footwear. She was able to void bladder but ultimately unable to have a BM. Total A for perihygiene in standing and Mod A for donning overhead dress. CGA-Min A stand pivot<bed using RW. Pt left in care of RN at close of session to receive medicine. Tx focus placed on sit<stands, functional transfers, and ADL retraining.    Therapy Documentation Precautions:  Precautions Precautions: Fall Restrictions Weight Bearing Restrictions: No Pain: Pain Assessment Pain Scale: 0-10 Pain Score: 6  Pain Type: Acute pain Pain Location: Foot Pain Orientation: Left;Right Pain Descriptors / Indicators: Aching;Burning;Sharp Pain Frequency: Intermittent Pain Onset: On-going Pain  Intervention(s): RN made aware Multiple Pain Sites: No ADL:       Therapy/Group: Individual Therapy  Seba Madole A Breniya Goertzen 10/09/2019, 12:26 PM

## 2019-10-09 NOTE — Progress Notes (Signed)
Physical Therapy Session Note  Patient Details  Name: [name] Good MRN: [id number] Date of Birth: October 17, 1950  Today's Date: 10/09/2019 PT Individual Time: 1120-1205 PT Individual Time Calculation (min): 45 min   Short Term Goals: Week 1:  PT Short Term Goal 1 (Week 1): Pt will perform supine<>sit with min assist PT Short Term Goal 2 (Week 1): Pt will perform sit<>stand using LRAD with mod assist of 1 PT Short Term Goal 3 (Week 1): Pt will perform bed<>chair transfer using LRAD with mod assist of 1 PT Short Term Goal 4 (Week 1): Pt will ambulate at least 68f using LRAD with mod assist of 1  Skilled Therapeutic Interventions/Progress Updates: Pt presented in bed agreeable to therapy. Pt stating had BM and needs to be changed. Pt agreeable to attempt standing for peri-care. Performed supine to sit with minA and use of bed features and scooted to EOB with CGA. Pt performed STS from slightly elevated bed with modA and pt pulling up on RW. Pt was able to tolerate standing ~1 min as PTA performed peri-care. Pt then sat as PTA donned new brief and pants maxA for time management and energy conservation. Pt was able to perform another STS in same manner as PTA assisted with pulling brief/pants over hips with modA. Pt then returned to sitting for recovery. Performed stand pivot transfer to w/c to R with minA. During STS and pivot pt attempting to increase wt bearing tolerance on balls of feet. Pt stated after pivot transfer no significant increase in pain in feet. Pt then propelled w/c with supervision and verbal cues for technique to day room for BUE strengthening and conditioning. Pt then ambulated x 149fto mat with minA and RW, requiring minA for STS from w/c and verbal cues for correct hand placement. Pt noted increased exertion after ambulation however pleased with activity. Pt then performed stand pivot back to w/c with CGA for STS from mat and minA for transfer. Pt transported back to room at end of  session and agreeable to remain in w/c for lunch. Pt left with belt alarm on, call bell within reach and needs met.      Therapy Documentation Precautions:  Precautions Precautions: Fall Restrictions Weight Bearing Restrictions: No General:   Vital Signs: Therapy Vitals Temp: 98.6 F (37 C) Pulse Rate: 98 Resp: 16 BP: 129/71 Patient Position (if appropriate): Sitting Oxygen Therapy SpO2: 99 % O2 Device: Room Air   Therapy/Group: Individual Therapy  Keelin Neville  Saliha Salts, PTA  10/09/2019, 4:17 PM

## 2019-10-09 NOTE — Progress Notes (Signed)
Occupational Therapy Session Note  Patient Details  Name: [name] Good MRN: [id number] Date of Birth: 09-05-1950  Today's Date: 10/09/2019 OT Individual Time: 1245-8099 OT Individual Time Calculation (min): 42 min    Short Term Goals: Week 1:  OT Short Term Goal 1 (Week 1): Pt will perform LB dressing with mod A overall. OT Short Term Goal 2 (Week 1): Pt will perform toileting with mod A overall. OT Short Term Goal 3 (Week 1): Pt will perform toilet transfer with mod A overall. OT Short Term Goal 4 (Week 1): Pt will perform UB dressing with min A overall.  Skilled Therapeutic Interventions/Progress Updates:    Pt greeted in the w/c, just finishing up lunch. Denture care completed while seated at the sink afterwards with Min A to open and close cap of her mouthwash. She was then agreeable to work on UB strengthening for remainder of session. After OT guided her through gentle warmup stretches for the UB, pt completed B UE therapeutic exercise using 3# bar x15 reps, 2 sets given instruction. Discussed DME needs for home with pt reporting that she needs a TTB but already owns a BSC. At end of tx pt remained sitting in the w/c with all needs within reach and safety belt fastened.   Therapy Documentation Precautions:  Precautions Precautions: Fall Restrictions Weight Bearing Restrictions: No Pain: no c/o pain during tx Pain Assessment Pain Scale: 0-10 Pain Score: 6  Pain Type: Acute pain Pain Location: Foot Pain Orientation: Left;Right Pain Descriptors / Indicators: Aching;Burning;Sharp Pain Onset: On-going Pain Intervention(s): RN made aware Multiple Pain Sites: No ADL:       Therapy/Group: Individual Therapy  Taiten Brawn A Donzell Coller 10/09/2019, 3:43 PM

## 2019-10-09 NOTE — Progress Notes (Signed)
Speech Language Pathology Daily Session Note  Patient Details  Name: [name] Good MRN: [id number] Date of Birth: 20-Aug-1950  Today's Date: 10/09/2019 SLP Individual Time: 6759-1638 SLP Individual Time Calculation (min): 58 min  Short Term Goals: Week 1: SLP Short Term Goal 1 (Week 1): Pt will demonstrate ability to problem solving functional basic situations with Min A verbal/visual cues. SLP Short Term Goal 2 (Week 1): Pt will recall new and daily information with Min A cues for use of compensatory aids/strategies. SLP Short Term Goal 3 (Week 1): Pt will alternate attention between functional tasks with Min A verbal cues. SLP Short Term Goal 4 (Week 1): Pt will anticipate 2 ADLs she will require assistance with upon her return home with Mod A cues.  Skilled Therapeutic Interventions: Pt was seen for skilled ST targeting cognition. SLP facilitated session with various semi-complex money management tasks to address problem solving and emergent awareness goals. Pt only required Supervision A for problem solving during a semi-complex verbal money scenario task, however increased Mod A verbal and visual cues required for both problem solving and error awareness when solving functional daily math problems from the ALFA. Pt verbally recalled activities of last ST session with overall Min A verbal prompts. Pt recalled the name of pain medicine she could receive and that she would need to request it (since it's a PRN medication), however Max A required for her to recall if she had already taken a dosage this morning. Pt requested medication from RN appropriately with Supervision A verbal cues for problem solving. Pt left laying in bed with alarm set and needs within reach. Continue per current plan of care.        Pain Pain Assessment Pain Scale: 0-10 Pain Score: 6  Pain Type: Acute pain Pain Location: Foot Pain Orientation: Left;Right Pain Descriptors / Indicators: Aching;Burning;Sharp Pain  Frequency: Intermittent Pain Onset: On-going Pain Intervention(s): RN made aware Multiple Pain Sites: No  Therapy/Group: Individual Therapy  Little Ishikawa 10/09/2019, 7:04 AM

## 2019-10-09 NOTE — Progress Notes (Signed)
Pt had medium-large hard/lumpy incontinent stool just prior to enema, with dig stim more stool was felt so continued with soap suds enema. About solution flushed. More small results. Probably still a little bit of stool in rectal vault but pt stated she wanted to stop. What stool is left seems soft and passable.

## 2019-10-09 NOTE — Progress Notes (Signed)
[facility] PHYSICAL MEDICINE & REHABILITATION PROGRESS NOTE   Subjective/Complaints:  Nursing requesting dulc supp and senna for constipation  ROS: Patient denies CP, SOB, N/V/D    Objective:   No results found. No results for input(s): WBC, HGB, HCT, PLT in the last 72 hours. No results for input(s): NA, K, CL, CO2, GLUCOSE, BUN, CREATININE, CALCIUM in the last 72 hours.  Intake/Output Summary (Last 24 hours) at 10/09/2019 0829 Last data filed at 10/09/2019 0804 Gross per 24 hour  Intake 1320 ml  Output --  Net 1320 ml     Physical Exam: Vital Signs Blood pressure 132/64, pulse 89, temperature 98.6 F (37 C), temperature source Oral, resp. rate 16, SpO2 97 %.   General: No acute distress Mood and affect are appropriate Heart: Regular rate and rhythm no rubs murmurs or extra sounds Lungs: Clear to auscultation, breathing unlabored, no rales or wheezes Abdomen: Positive bowel sounds, soft nontender to palpation, nondistended Extremities: No clubbing, cyanosis, or edema Skin: No evidence of breakdown, no evidence of rash Sensory exam normal sensation to light touch bilateral upper and lower extremitiesNo hypersensitivity or allodynia  Cerebellar exam normal finger to nose to finger  bilateral upper  Musculoskeletal: Full range of motion in all 4 extremities. No jt/LE swelling    Assessment/Plan: 1. Functional deficits secondary to Right thalamic  which require 3+ hours per day of interdisciplinary therapy in a comprehensive inpatient rehab setting.  Physiatrist is providing close team supervision and 24 hour management of active medical problems listed below.  Physiatrist and rehab team continue to assess barriers to discharge/monitor patient progress toward functional and medical goals  Care Tool:  Bathing  Bathing activity did not occur: Refused Body parts bathed by patient: Right arm, Left arm, Chest, Abdomen, Right upper leg, Left upper leg, Face   Body parts  bathed by helper: Front perineal area, Buttocks, Right lower leg, Left lower leg     Bathing assist Assist Level: Moderate Assistance - Patient 50 - 74%     Upper Body Dressing/Undressing Upper body dressing Upper body dressing/undressing activity did not occur (including orthotics): Refused What is the patient wearing?: Pull over shirt    Upper body assist Assist Level: Maximal Assistance - Patient 25 - 49%    Lower Body Dressing/Undressing Lower body dressing    Lower body dressing activity did not occur: Refused What is the patient wearing?: Underwear/pull up, Pants     Lower body assist Assist for lower body dressing: Total Assistance - Patient < 25%     Toileting Toileting Toileting Activity did not occur Landscape architect and hygiene only): Refused  Toileting assist Assist for toileting: Moderate Assistance - Patient 50 - 74%     Transfers Chair/bed transfer  Transfers assist  Chair/bed transfer activity did not occur: Refused  Chair/bed transfer assist level: Minimal Assistance - Patient > 75% (slideboard)     Locomotion Ambulation   Ambulation assist   Ambulation activity did not occur: Safety/medical concerns (unable due to pain limiting WBing on feet)          Walk 10 feet activity   Assist  Walk 10 feet activity did not occur: Safety/medical concerns        Walk 50 feet activity   Assist Walk 50 feet with 2 turns activity did not occur: Safety/medical concerns         Walk 150 feet activity   Assist Walk 150 feet activity did not occur: Safety/medical concerns  Walk 10 feet on uneven surface  activity   Assist Walk 10 feet on uneven surfaces activity did not occur: Safety/medical concerns         Wheelchair     Assist Will patient use wheelchair at discharge?:  (TBD) Type of Wheelchair: Manual    Wheelchair assist level: Supervision/Verbal cueing Max wheelchair distance: 100 ft    Wheelchair 50  feet with 2 turns activity    Assist        Assist Level: Supervision/Verbal cueing   Wheelchair 150 feet activity     Assist          Blood pressure 132/64, pulse 89, temperature 98.6 F (37 C), temperature source Oral, resp. rate 16, SpO2 97 %.  Medical Problem List and Plan: 1.  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  Antithrombotics: -DVT/anticoagulation: Eliquis             -antiplatelet therapy: N/A 3. Pain Management: Oxycodone and Robaxin as needed- has been c/o her feet hurting intermittently 4. Mood: Melatonin 3 mg nightly ineffective for sleep  -add trazodone scheduled 50mg  which may help sleep and mood  -increase gabapentin as below  -xanax prn for anxiety  -recommend neuropsych assessment             -antipsychotic agents: N/A 5. Neuropsych: This patient is capable of making decisions on her own behalf. 6. Skin/Wound Care: Routine skin checks 7. Fluids/Electrolytes/Nutrition: Routine in and outs with follow-up chemistries 8.  LV apical thrombus.  Continue Eliquis as directed.  Follow-up outpatient Dr. cardiology services plan repeat echocardiogram 3 months 9.  Hyperlipidemia.  Lipitor 10.  Restless leg syndrome.  Requip as needed- has significant Sx's-  11.  History of lumbar decompression complicated by wound dehiscence 2017.  Patient used assistive device prior to admission. 12. Hypokalemia- improved to 4.1- was 2.3 on admission with significant dehydration that has resolved.   13. LE discomfort but neurovascular exam is normal 14.  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  Constipation add senna no sign of obstruction  LOS: 4 days A FACE TO FACE EVALUATION WAS PERFORMED  01-16-1987 10/09/2019, 8:29 AM

## 2019-10-10 ENCOUNTER — Encounter (HOSPITAL_COMMUNITY): Payer: Medicare Other | Admitting: Occupational Therapy

## 2019-10-10 ENCOUNTER — Inpatient Hospital Stay (HOSPITAL_COMMUNITY): Payer: Medicare Other | Admitting: Physical Therapy

## 2019-10-10 ENCOUNTER — Inpatient Hospital Stay (HOSPITAL_COMMUNITY): Payer: Medicare Other | Admitting: Occupational Therapy

## 2019-10-10 ENCOUNTER — Inpatient Hospital Stay (HOSPITAL_COMMUNITY): Payer: Medicare Other

## 2019-10-10 MED ORDER — ARTIFICIAL TEARS OPHTHALMIC OINT
TOPICAL_OINTMENT | OPHTHALMIC | Status: DC | PRN
  Filled 2019-10-10: qty 3.5

## 2019-10-10 NOTE — Progress Notes (Signed)
[facility] PHYSICAL MEDICINE & REHABILITATION PROGRESS NOTE   Subjective/Complaints:  Required dig stim , SSE for severe constipation ,now resolved  ROS: Patient denies CP, SOB, N/V/D    Objective:   No results found. No results for input(s): WBC, HGB, HCT, PLT in the last 72 hours. No results for input(s): NA, K, CL, CO2, GLUCOSE, BUN, CREATININE, CALCIUM in the last 72 hours.  Intake/Output Summary (Last 24 hours) at 10/10/2019 0827 Last data filed at 10/09/2019 1350 Gross per 24 hour  Intake 222 ml  Output --  Net 222 ml     Physical Exam: Vital Signs Blood pressure 139/60, pulse 92, temperature 98.7 F (37.1 C), resp. rate 17, SpO2 93 %.   General: No acute distress Mood and affect are appropriate Heart: Regular rate and rhythm no rubs murmurs or extra sounds Lungs: Clear to auscultation, breathing unlabored, no rales or wheezes Abdomen: Positive bowel sounds, soft nontender to palpation, nondistended Extremities: No clubbing, cyanosis, or edema Skin: No evidence of breakdown, no evidence of rash Neurologic: Cranial nerves II through XII intact, motor strength is 4/5 in bilateral deltoid, bicep, tricep, grip, hip flexor, knee extensors, ankle dorsiflexor and plantar flexor  Musculoskeletal: Full range of motion in all 4 extremities. No joint swelling     Assessment/Plan: 1. Functional deficits secondary to Right thalamic  which require 3+ hours per day of interdisciplinary therapy in a comprehensive inpatient rehab setting.  Physiatrist is providing close team supervision and 24 hour management of active medical problems listed below.  Physiatrist and rehab team continue to assess barriers to discharge/monitor patient progress toward functional and medical goals  Care Tool:  Bathing  Bathing activity did not occur: Refused Body parts bathed by patient: Right arm, Left arm, Chest, Abdomen, Right upper leg, Left upper leg, Face   Body parts bathed by helper:  Front perineal area, Buttocks, Right lower leg, Left lower leg     Bathing assist Assist Level: Moderate Assistance - Patient 50 - 74%     Upper Body Dressing/Undressing Upper body dressing Upper body dressing/undressing activity did not occur (including orthotics): Refused What is the patient wearing?: Dress    Upper body assist Assist Level: Moderate Assistance - Patient 50 - 74%    Lower Body Dressing/Undressing Lower body dressing    Lower body dressing activity did not occur: Refused What is the patient wearing?: Incontinence brief     Lower body assist Assist for lower body dressing: Total Assistance - Patient < 25%     Toileting Toileting Toileting Activity did not occur Landscape architect and hygiene only): Refused  Toileting assist Assist for toileting: Maximal Assistance - Patient 25 - 49%     Transfers Chair/bed transfer  Transfers assist  Chair/bed transfer activity did not occur: Refused  Chair/bed transfer assist level: Minimal Assistance - Patient > 75%     Locomotion Ambulation   Ambulation assist   Ambulation activity did not occur: Safety/medical concerns (unable due to pain limiting WBing on feet)  Assist level: Minimal Assistance - Patient > 75% Assistive device: Walker-rolling Max distance: 73ft   Walk 10 feet activity   Assist  Walk 10 feet activity did not occur: Safety/medical concerns        Walk 50 feet activity   Assist Walk 50 feet with 2 turns activity did not occur: Safety/medical concerns         Walk 150 feet activity   Assist Walk 150 feet activity did not occur: Safety/medical concerns  Walk 10 feet on uneven surface  activity   Assist Walk 10 feet on uneven surfaces activity did not occur: Safety/medical concerns         Wheelchair     Assist Will patient use wheelchair at discharge?:  (TBD) Type of Wheelchair: Manual    Wheelchair assist level: Supervision/Verbal cueing Max  wheelchair distance: 100 ft    Wheelchair 50 feet with 2 turns activity    Assist        Assist Level: Supervision/Verbal cueing   Wheelchair 150 feet activity     Assist          Blood pressure 139/60, pulse 92, temperature 98.7 F (37.1 C), resp. rate 17, SpO2 93 %.  Medical Problem List and Plan: 1.  Left side weakness secondary to right thalamic infarction             -patient may  shower             -ELOS/Goals: 2-2.5 weeks- goals supervision- team conf in am  CIR PT< OT, SLP ongoing 2.  Antithrombotics: -DVT/anticoagulation: Eliquis             -antiplatelet therapy: N/A 3. Pain Management: Oxycodone and Robaxin as needed- has been c/o her feet hurting intermittently 4. Mood: Melatonin 3 mg nightly ineffective for sleep  -add trazodone scheduled 50mg  which may help sleep and mood  -increase gabapentin as below  -xanax prn for anxiety  -recommend neuropsych assessment             -antipsychotic agents: N/A 5. Neuropsych: This patient is capable of making decisions on her own behalf. 6. Skin/Wound Care: Routine skin checks 7. Fluids/Electrolytes/Nutrition: Routine in and outs with follow-up chemistries 8.  LV apical thrombus.  Continue Eliquis as directed.  Follow-up outpatient Dr. cardiology services plan repeat echocardiogram 3 months 9.  Hyperlipidemia.  Lipitor 10.  Restless leg syndrome.  Requip as needed- has significant Sx's-  11.  History of lumbar decompression complicated by wound dehiscence 2017.  Patient used assistive device prior to admission. 12. Hypokalemia- improved to 4.1- was 2.3 on admission with significant dehydration that has resolved.   13. LE discomfort but neurovascular exam is normal 14.  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  Constipation resolved after SSE, cont  senna no sign of obstruction  LOS: 5 days A FACE TO FACE EVALUATION WAS  PERFORMED  01-16-1987 10/10/2019, 8:27 AM

## 2019-10-10 NOTE — Progress Notes (Signed)
Occupational Therapy Session Note  Patient Details  Name: [name] Good MRN: [id number] Date of Birth: [birth year]  Today's Date: 10/10/2019 OT Individual Time: 9417-4081 and 1115-1200 OT Individual Time Calculation (min): 45 min and 45 mins   Short Term Goals: Week 1:  OT Short Term Goal 1 (Week 1): Pt will perform LB dressing with mod A overall. OT Short Term Goal 2 (Week 1): Pt will perform toileting with mod A overall. OT Short Term Goal 3 (Week 1): Pt will perform toilet transfer with mod A overall. OT Short Term Goal 4 (Week 1): Pt will perform UB dressing with min A overall.  Skilled Therapeutic Interventions/Progress Updates:    Session 1:  Upon entering the room, pt supine in bed with no c/o pain but does report, " I was backed up (bowels) and they keep giving me stuff and I think I will be going all day". Pt performed supine >sit with close supervision to EOB. Pt very anxious about transfers this session and RN reports pt declined anxiety medication. Pt performed stand pivot transfer with RW from bed >wheelchair with mod A. Pt then reports, " I think I just went again in my pants." OT assisted pt into bathroom via wheelchair with mod A stand pivot transfer onto commode. Pt needing assistance with clothing management and hygiene this session. Pt dressing from commode for time management. Min A to don pull over shirt to pull over head. Pt needing assistance to thread feet into B pant legs and standing with mod A with min A standing balance for hygiene and clothing management. Pt transferred back into wheelchair in same manner. Pt seated in wheelchair for grooming tasks with increased time and supervision. Chair alarm belt donned and call bell within reach upon exiting the room.   Session 2: Upon entering the room, pt seated in wheelchair and reports, " Something is wrong with my L eye. I can't see very well out of it." Pt needing assistance to call her daughter to ask for her to bring in  some items from home with pt needing mod cuing to locate correct number in phone contacts. OT flushing pt's eye out with sterile solution and RN present. Pt reports eye feels much better afterwards and she can see like "normal" now. OT assisted pt via wheelchair to tub room for time management. OT demonstrated transfer onto TTB with use of RW. Pt returning demonstration with mod A stand pivot transfer and min A for balance but able to get LEs over tub ledge on her own. Pt exiting the tub and transferred back into wheelchair in same manner. RN giving medication for lower back pain this session and heat applied to area with therapist checking heat/skin multiple times for safety. Pt remained in wheelchair with chair alarm belt donned and all needs within reach.   Therapy Documentation Precautions:  Precautions Precautions: Fall Restrictions Weight Bearing Restrictions: No    Pain: Pain Assessment Pain Score: 0-No pain   Therapy/Group: Individual Therapy  Alen Bleacher 10/10/2019, 12:20 PM

## 2019-10-10 NOTE — Progress Notes (Signed)
Speech Language Pathology Daily Session Note  Patient Details  Name: [name] Good MRN: [id number] Date of Birth: 28-Aug-1950  Today's Date: 10/10/2019 SLP Individual Time: 2426-8341 SLP Individual Time Calculation (min): 30 min  Short Term Goals: Week 1: SLP Short Term Goal 1 (Week 1): Pt will demonstrate ability to problem solving functional basic situations with Min A verbal/visual cues. SLP Short Term Goal 2 (Week 1): Pt will recall new and daily information with Min A cues for use of compensatory aids/strategies. SLP Short Term Goal 3 (Week 1): Pt will alternate attention between functional tasks with Min A verbal cues. SLP Short Term Goal 4 (Week 1): Pt will anticipate 2 ADLs she will require assistance with upon her return home with Mod A cues.  Skilled Therapeutic Interventions: Skilled ST services focused on cognitive skills. Pt expressed fatigue but agreed to participate in skilled ST services. Pt was unable to recall ST events from yesterday and difficulty recalling general events from yesterday's therapy sessions, stating " my memory is not great right now." SLP initiated memory notebook and educated pt in function, pt agreed to record daily events to aid in recall. SLP facilitated recall with visual aid and basic problem solving skills in novel card task (Blink), pt required min A fade to supervision A verbal cues. SLP recorded events in memory notebook. Pt was left in room with call bell within reach and bed alarm set. ST recommends to continue skilled ST services.      Pain Pain Assessment Pain Scale: 0-10 Pain Score: 0-No pain Pain Type: Acute pain Pain Location: Leg Pain Orientation: Right;Left Pain Descriptors / Indicators: Aching Pain Onset: On-going Patients Stated Pain Goal: 4 Pain Intervention(s): Medication (See eMAR);Repositioned  Therapy/Group: Individual Therapy  [name]  [facility] 10/10/2019, 9:34 AM

## 2019-10-10 NOTE — Progress Notes (Signed)
Physical Therapy Session Note  Patient Details  Name: [name] Good MRN: [id number] Date of Birth: [birth year]  Today's Date: 10/10/2019 PT Individual Time: 0915-1000 PT Individual Time Calculation (min): 45 min   Short Term Goals: Week 1:  PT Short Term Goal 1 (Week 1): Pt will perform supine<>sit with min assist PT Short Term Goal 2 (Week 1): Pt will perform sit<>stand using LRAD with mod assist of 1 PT Short Term Goal 3 (Week 1): Pt will perform bed<>chair transfer using LRAD with mod assist of 1 PT Short Term Goal 4 (Week 1): Pt will ambulate at least 51f using LRAD with mod assist of 1  Skilled Therapeutic Interventions/Progress Updates: Pt presented in w/c agreeable to therapy. Pt states some ache in B feet but no numerical rating given. Pt transported to rehab gym and performed stand pivot transfer with RW to mat with min/modA. Pt required increased time upon standing due to pain in feet and required verbal cues for sequencing. Pt participated in ankle pumps and LAQ 2 x 10. Participated in STS x 4 from slightly elevated mat with min/modA and mod verbal cues for foot and hand placement. Pt also required cues for increased forward lean however limited due to increased back pain. Pt then performed stand pivot with RW and minA and mod verbal cues for sequencing to w/c and transported to day room. Pt transferred in same manner as prior to NuStep and participated in NuStep L1 x 5 min for general conditioning with pt maintaining avg 35-40 SPM as limited by pain in dorsal aspect of L foot. Performed stand pivot back to w/c with minA and transported back to room. Pt left in w/c at end of session with belt alarm on, call bell within reach and needs met.      Therapy Documentation Precautions:  Precautions Precautions: Fall Restrictions Weight Bearing Restrictions: No General:   Vital Signs: Therapy Vitals Temp: 98.2 F (36.8 C) Temp Source: Oral Pulse Rate: 86 Resp: 16 BP:  123/73 Patient Position (if appropriate): Lying Oxygen Therapy SpO2: 99 % O2 Device: Room Air    Therapy/Group: Individual Therapy  Jazzy Parmer  Kashlyn Salinas, PTA  10/10/2019, 4:00 PM

## 2019-10-10 NOTE — Progress Notes (Signed)
Ok to order weekly bmet and cbc per Dr Wynn Banker while on Xarelto.  Ulyses Southward, PharmD, BCIDP, AAHIVP, CPP Infectious Disease Pharmacist 10/10/2019 2:14 PM

## 2019-10-10 NOTE — Progress Notes (Signed)
Occupational Therapy Session Note  Patient Details  Name: [name] Good MRN: [id number] Date of Birth: 28-Apr-1950  Today's Date: 10/10/2019 OT Individual Time: 3614-4315 OT Individual Time Calculation (min): 29 min    Short Term Goals: Week 1:  OT Short Term Goal 1 (Week 1): Pt will perform LB dressing with mod A overall. OT Short Term Goal 2 (Week 1): Pt will perform toileting with mod A overall. OT Short Term Goal 3 (Week 1): Pt will perform toilet transfer with mod A overall. OT Short Term Goal 4 (Week 1): Pt will perform UB dressing with min A overall.  Skilled Therapeutic Interventions/Progress Updates:    Upon entering the room, pt supine in bed and on phone with sister in law. Pt very tearful this session about mother's recent passing and OT providing therapeutic use of self. Pt verbalized, " I'm not sure but I might have boo booed in my pants". Pt agreeable to toileting. Supine >sit with min guard to EOB. Sit >stand with mod A and min A stand pivot transfer to wheelchair. OT assisted pt via wheelchair into bathroom with min A stand pivot transfer with use of grab bar onto commode. Pt began having diarrhea on commode and needing assistance with hygiene for thoroughness but was able to wipe some while seated on commode. Pt needing assistance to pull pants over B hips while standing and then mod cuing for sequencing for stand pivot back to wheelchair towards R side. Pt requesting to remain up in wheelchair at end of session with chair alarm belt donned and call bell within reach.   Therapy Documentation Precautions:  Precautions Precautions: Fall Restrictions Weight Bearing Restrictions: No Vital Signs: Therapy Vitals Temp: 98.2 F (36.8 C) Temp Source: Oral Pulse Rate: 86 Resp: 16 BP: 123/73 Patient Position (if appropriate): Lying Oxygen Therapy SpO2: 99 % O2 Device: Room Air   Therapy/Group: Individual Therapy  Alen Bleacher 10/10/2019, 4:06 PM

## 2019-10-11 ENCOUNTER — Inpatient Hospital Stay (HOSPITAL_COMMUNITY): Payer: Medicare Other

## 2019-10-11 ENCOUNTER — Inpatient Hospital Stay (HOSPITAL_COMMUNITY): Payer: Medicare Other | Admitting: Physical Therapy

## 2019-10-11 ENCOUNTER — Inpatient Hospital Stay (HOSPITAL_COMMUNITY): Payer: Medicare Other | Admitting: Occupational Therapy

## 2019-10-11 LAB — CBC
HCT: 41.9 % (ref 36.0–46.0)
Hemoglobin: 13.6 g/dL (ref 12.0–15.0)
MCH: 31.2 pg (ref 26.0–34.0)
MCHC: 32.5 g/dL (ref 30.0–36.0)
MCV: 96.1 fL (ref 80.0–100.0)
Platelets: 328 10*3/uL (ref 150–400)
RBC: 4.36 MIL/uL (ref 3.87–5.11)
RDW: 12 % (ref 11.5–15.5)
WBC: 6.8 10*3/uL (ref 4.0–10.5)
nRBC: 0 % (ref 0.0–0.2)

## 2019-10-11 LAB — BASIC METABOLIC PANEL
Anion gap: 11 (ref 5–15)
BUN: 11 mg/dL (ref 8–23)
CO2: 22 mmol/L (ref 22–32)
Calcium: 8.9 mg/dL (ref 8.9–10.3)
Chloride: 107 mmol/L (ref 98–111)
Creatinine, Ser: 0.74 mg/dL (ref 0.44–1.00)
GFR calc Af Amer: >60 mL/min (ref >60)
GFR calc non Af Amer: >60 mL/min (ref >60)
Glucose, Bld: 110 mg/dL — ABNORMAL HIGH (ref 70–99)
Potassium: 4.4 mmol/L (ref 3.5–5.1)
Sodium: 140 mmol/L (ref 135–145)

## 2019-10-11 NOTE — Patient Care Conference (Signed)
Inpatient RehabilitationTeam Conference and Plan of Care Update Date: 10/11/2019   Time: 1:23 PM    Patient Name: [name] Good      Medical Record Number: [id number]  Date of Birth: 1951-01-27 Sex: Female         Room/Bed: 4W09C/4W09C-01 Payor Info: Payor: [employer] MEDICARE / Plan: [facility] MEDICARE / Product Type: *No Product type* /    Admit Date/Time:  10/05/2019  2:41 PM  Primary Diagnosis:  Right thalamic infarction [facility])  Patient Active Problem List   Diagnosis Date Noted  . Right thalamic infarction ([place]) 10/05/2019  . Severe dehydration 10/02/2019  . Grief reaction 10/02/2019  . RLS (restless legs syndrome) 10/02/2019  . Acute CVA (cerebrovascular accident) ([place]) 10/01/2019  . Hypokalemia 10/01/2019  . Cellulitis of left upper extremity 10/18/2017  . Arthritis 10/18/2017  . Septic shock ([place]) 10/16/2017  . Former smoker 11/13/2016  . History of restless legs syndrome 11/13/2016  . Nontraumatic incomplete tear of right rotator cuff 08/18/2016  . Autoimmune disease ([place]) 08/12/2016  . Primary osteoarthritis of both knees 08/12/2016  . Primary osteoarthritis of both feet 08/12/2016  . Obesity (BMI 35.0-39.9 without comorbidity) 08/12/2016  . Osteoarthritis of right hip 10/25/2015  . 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 S/P lumbar laminectomy 06/14/2015    Expected Discharge Date: Expected Discharge Date: 10/21/19  Team Members Present: Physician leading conference: Dr. Alysia Penna Care Coodinator Present: Nestor Lewandowsky, RN, BSN, CRRN;Christina Sampson Goon, BSW Nurse Present: Rosita Fire, RN PT Present: Barrie Folk, PT OT Present: Darleen Crocker, OT SLP Present: Charolett Bumpers, SLP PPS Coordinator present : Ileana Ladd, PT     Current Status/Progress Goal Weekly Team Focus  Bowel/Bladder   Pt remains continent of bowel and bladder. LBM 10/10/2019. Constipation noted on 10/09/2019. Enema given with positive results  Pt to remain  continent of bowel and bladder. No new bouts of constipation  Assess bowel/bladder QS/PRN   Swallow/Nutrition/ Hydration             ADL's   Mod A bathing sit<stand from BSC using RW, Mod A UB dressing, Max-Total A LB dressing, Max A toileting, Min A stand pivot toilet transfer using RW  Supervision-CGA overall  NMR, sit<stands, functional transfers, ADL retraining, pt/family education   Mobility   Requires  25% assistance for bed mobilty, pt now able to tolerate wt on feet and can perform sit to stands with moderate (50%) assistance. Initiated ambulation with rolling walker and toelrated up to 66ft with minimal (25%) assistance with pt demonstrating unsteady balance and narrow base of support  supervision bed mobility and transfers, with contact guard (touching assistance) for ambulation  standing tolerance, standing balance, transfers, ambulation, d/c planning   Communication             Safety/Cognition/ Behavioral Observations  supervision A basic problem solving , mod A- supervision A semi-complex problem solving/emgerent awareness  Supervision A - Mod I  semi-complex problem solving, memory notebook for daily recall, emergent awareness and higher level attention   Pain   Pt's pain level managed by PRN oxycodone and scheduled Gabapentin  Pt's pain level <3  Assess pain level AS/PRN   Skin   Pt's skin intact  Pt's skin remains intact.  Assess skin QS/PRN    Rehab Goals Patient on target to meet rehab goals: Yes Rehab Goals Revised: patient on target with current goals *See Care Plan and progress notes for long and short-term goals.     Barriers  to Discharge  Current Status/Progress Possible Resolutions Date Resolved   Nursing                  PT                    OT                  SLP                Care Coordinator Home environment access/layout 4 steps with railings on target with current goals          Discharge Planning/Teaching Needs:  Patient plans to discharge  home with daughter to asisst during day and nights, if needed  Will schedule with daughter if recommended   Team Discussion:  Issues with constipation addressed and MD adjusting meds for sleep. Anxiety, back pain and fear of falling, knee buckling limiting activity. Patient making good progress overall towards supervision goals  Revisions to Treatment Plan:  Memory notebook added to care    Medical Summary Current Status: anxious during therapy, had Back pain yesterday Weekly Focus/Goal: improve anxiety during therapy  Barriers to Discharge: Medical stability   Possible Resolutions to Barriers: address MSK pain during therapy , trial K pad, address fear of falling   Continued Need for Acute Rehabilitation Level of Care: The patient requires daily medical management by a physician with specialized training in physical medicine and rehabilitation for the following reasons: Direction of a multidisciplinary physical rehabilitation program to maximize functional independence : Yes Medical management of patient stability for increased activity during participation in an intensive rehabilitation regime.: Yes Analysis of laboratory values and/or radiology reports with any subsequent need for medication adjustment and/or medical intervention. : Yes   I attest that I was present, lead the team conference, and concur with the assessment and plan of the team.   Chana Bode B 10/11/2019, 1:23 PM

## 2019-10-11 NOTE — Progress Notes (Signed)
[facility] PHYSICAL MEDICINE & REHABILITATION PROGRESS NOTE   Subjective/Complaints:  Gets nightmares from trazodone, still taking oxycodone  ROS: Patient denies CP, SOB, N/V/D    Objective:   No results found. Recent Labs    10/11/19 0610  WBC 6.8  HGB 13.6  HCT 41.9  PLT 328   Recent Labs    10/11/19 0610  NA 140  K 4.4  CL 107  CO2 22  GLUCOSE 110*  BUN 11  CREATININE 0.74  CALCIUM 8.9    Intake/Output Summary (Last 24 hours) at 10/11/2019 1017 Last data filed at 10/11/2019 0825 Gross per 24 hour  Intake 260 ml  Output --  Net 260 ml     Physical Exam: Vital Signs Blood pressure 111/65, pulse 88, temperature 98.5 F (36.9 C), resp. rate 18, SpO2 100 %.  General: No acute distress Mood and affect are appropriate Heart: Regular rate and rhythm no rubs murmurs or extra sounds Lungs: Clear to auscultation, breathing unlabored, no rales or wheezes Abdomen: Positive bowel sounds, soft nontender to palpation, nondistended Extremities: No clubbing, cyanosis, or edema Skin: No evidence of breakdown, no evidence of rash   Neurologic: Cranial nerves II through XII intact, motor strength is 4/5 in bilateral deltoid, bicep, tricep, grip, hip flexor, knee extensors, ankle dorsiflexor and plantar flexor  Musculoskeletal: Full range of motion in all 4 extremities. No joint swelling     Assessment/Plan: 1. Functional deficits secondary to Right thalamic  which require 3+ hours per day of interdisciplinary therapy in a comprehensive inpatient rehab setting.  Physiatrist is providing close team supervision and 24 hour management of active medical problems listed below.  Physiatrist and rehab team continue to assess barriers to discharge/monitor patient progress toward functional and medical goals  Care Tool:  Bathing  Bathing activity did not occur: Refused Body parts bathed by patient: Right arm, Left arm, Chest, Abdomen, Right upper leg, Left upper leg, Face    Body parts bathed by helper: Front perineal area, Buttocks, Right lower leg, Left lower leg     Bathing assist Assist Level: Moderate Assistance - Patient 50 - 74%     Upper Body Dressing/Undressing Upper body dressing Upper body dressing/undressing activity did not occur (including orthotics): Refused What is the patient wearing?: Pull over shirt    Upper body assist Assist Level: Minimal Assistance - Patient > 75%    Lower Body Dressing/Undressing Lower body dressing    Lower body dressing activity did not occur: Refused What is the patient wearing?: Pants, Incontinence brief     Lower body assist Assist for lower body dressing: Total Assistance - Patient < 25%     Toileting Toileting Toileting Activity did not occur Landscape architect and hygiene only): Refused  Toileting assist Assist for toileting: Maximal Assistance - Patient 25 - 49%     Transfers Chair/bed transfer  Transfers assist  Chair/bed transfer activity did not occur: Refused  Chair/bed transfer assist level: Minimal Assistance - Patient > 75%     Locomotion Ambulation   Ambulation assist   Ambulation activity did not occur: Safety/medical concerns (unable due to pain limiting WBing on feet)  Assist level: Minimal Assistance - Patient > 75% Assistive device: Walker-rolling Max distance: 1f   Walk 10 feet activity   Assist  Walk 10 feet activity did not occur: Safety/medical concerns        Walk 50 feet activity   Assist Walk 50 feet with 2 turns activity did not occur: Safety/medical concerns  Walk 150 feet activity   Assist Walk 150 feet activity did not occur: Safety/medical concerns         Walk 10 feet on uneven surface  activity   Assist Walk 10 feet on uneven surfaces activity did not occur: Safety/medical concerns         Wheelchair     Assist Will patient use wheelchair at discharge?:  (TBD) Type of Wheelchair: Manual    Wheelchair  assist level: Supervision/Verbal cueing Max wheelchair distance: 100 ft    Wheelchair 50 feet with 2 turns activity    Assist        Assist Level: Supervision/Verbal cueing   Wheelchair 150 feet activity     Assist          Blood pressure 111/65, pulse 88, temperature 98.5 F (36.9 C), resp. rate 18, SpO2 100 %.  Medical Problem List and Plan: 1.  Left side weakness secondary to right thalamic infarction             -patient may  shower             -ELOS/Goals: 2-2.5 weeks- goals supervision-  Team conference today please see physician documentation under team conference tab, met with team  to discuss problems,progress, and goals. Formulized individual treatment plan based on medical history, underlying problem and comorbidities. CIR PT< OT, SLP ongoing 2.  Antithrombotics: -DVT/anticoagulation: Eliquis             -antiplatelet therapy: N/A 3. Pain Management: Oxycodone and Robaxin as needed- has been c/o her feet hurting intermittently 4. Mood: Melatonin 3 mg nightly ineffective for sleep  -add trazodone scheduled 47m which may help sleep and mood  -increase gabapentin as below  -xanax prn for anxiety  -recommend neuropsych assessment             -antipsychotic agents: N/A 5. Neuropsych: This patient is capable of making decisions on her own behalf. 6. Skin/Wound Care: Routine skin checks 7. Fluids/Electrolytes/Nutrition: Routine in and outs with follow-up chemistries 8.  LV apical thrombus.  Continue Eliquis as directed.  Follow-up outpatient Dr. KDoylene Canardcardiology services plan repeat echocardiogram 3 months 9.  Hyperlipidemia.  Lipitor 10.  Restless leg syndrome.  Requip as needed- has significant Sx's-  11.  History of lumbar decompression complicated by wound dehiscence 2017.  Patient used assistive device prior to admission. 12. Hypokalemia- improved to 4.1- was 2.3 on admission with significant dehydration that has resolved.   13. LE discomfort but  neurovascular exam is normal 14.  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  Constipation resolved after SSE, cont  senna no sign of obstruction  LOS: 6 days A FACE TO FACE EVALUATION WAS PERFORMED  AndCharlett Blake16/2021, 10:17 AM

## 2019-10-11 NOTE — Progress Notes (Signed)
Occupational Therapy Session Note  Patient Details  Name: [name] Good MRN: [id number] Date of Birth: Jun 12, 1950  Today's Date: 10/11/2019 OT Individual Time: 1000-1030 OT Individual Time Calculation (min): 30 min    Short Term Goals: Week 1:  OT Short Term Goal 1 (Week 1): Pt will perform LB dressing with mod A overall. OT Short Term Goal 2 (Week 1): Pt will perform toileting with mod A overall. OT Short Term Goal 3 (Week 1): Pt will perform toilet transfer with mod A overall. OT Short Term Goal 4 (Week 1): Pt will perform UB dressing with min A overall. Week 2:     Skilled Therapeutic Interventions/Progress Updates:    1:! Pt received in the w/c. Pt practiced functional ambulation from door of room into bathroom; navigating over thresholds with min cues and min guard. Pt able to perform toileting with min guard sit to stand. At first pt wanted to try regular underwear and pad but then decided due to ongoing BM wanted to stay with brief. Pt performed peri cleansing with setup for the front and total A for buttocks- pt reports using a large wipe and using a see-saw method to cleanse after BM.  Donned bucked shoes with total A  With TEDS with total A. Washed hands at sink with setup in seated position. Pt left resting in the w/c with lap belt and call bell.  Therapy Documentation Precautions:  Precautions Precautions: Fall Restrictions Weight Bearing Restrictions: No Pain: Reports soreness across top of right foot - donned TEDs and new sandals to try to see if accomodation helps with discomfort.  Therapy/Group: Individual Therapy  Roney Mans [facility] 10/11/2019, 10:12 AM

## 2019-10-11 NOTE — Progress Notes (Signed)
Speech Language Pathology Daily Session Note  Patient Details  Name: [name] Good MRN: [id number] Date of Birth: October 08, 1950  Today's Date: 10/11/2019 SLP Individual Time: 4098-1191 SLP Individual Time Calculation (min): 56 min  Short Term Goals: Week 1: SLP Short Term Goal 1 (Week 1): Pt will demonstrate ability to problem solving functional basic situations with Min A verbal/visual cues. SLP Short Term Goal 2 (Week 1): Pt will recall new and daily information with Min A cues for use of compensatory aids/strategies. SLP Short Term Goal 3 (Week 1): Pt will alternate attention between functional tasks with Min A verbal cues. SLP Short Term Goal 4 (Week 1): Pt will anticipate 2 ADLs she will require assistance with upon her return home with Mod A cues.  Skilled Therapeutic Interventions: Skilled ST services focused on cognitive skills. Pt demonstrated recall of today's events without memory notebook required supervision A verbal cues. SLP facilitated complex problem solving (requested QID verse BID pill organizer due to separate night time medication) and error awareness in medication management task, pt required mod A verbal cues for error awareness and mod A fade to min A verbal cues for problem solving. Pt noted changes in left peripheral vision. Pt was left in room with call bell within reach and chair alarm set. ST recommends to continue skilled ST services.      Pain Pain Assessment Pain Score: 0-No pain  Therapy/Group: Individual Therapy  Malini Flemings  [facility] 10/11/2019, 4:44 PM

## 2019-10-11 NOTE — Progress Notes (Signed)
Physical Therapy Session Note  Patient Details  Name: [name] Good MRN: [id number] Date of Birth: 01-10-1951  Today's Date: 10/11/2019 PT Individual Time: 0900-1000 PT Individual Time Calculation (min): 60 min   Short Term Goals: Week 1:  PT Short Term Goal 1 (Week 1): Pt will perform supine<>sit with min assist PT Short Term Goal 2 (Week 1): Pt will perform sit<>stand using LRAD with mod assist of 1 PT Short Term Goal 3 (Week 1): Pt will perform bed<>chair transfer using LRAD with mod assist of 1 PT Short Term Goal 4 (Week 1): Pt will ambulate at least 21ft using LRAD with mod assist of 1  Skilled Therapeutic Interventions/Progress Updates:    Pt received sitting in WC and agreeable to PT. PT assisted pt to perform upper and lower body dressing with min assist to manage clothing and CGA for sit<>stand to pull pants to waist.   Pt transported to rehab gym. 5xSTS 16sec(average of 3 trials.) CGA from PT for safety. Pt noted ot WB only through Bil heels   Gait training with RW 2 x 90ft with mod progressing to min assist. Mild L knee instability, but improved with increased distance, also provided cues for improved posture and proper AD management in turn.   PT instructed pt in WC mobility through hall with supervision assist and min cues for improved UE symmetry to maintain straight path x 186ft.   Seated BLE therex:  LAQ, ankle DF, hip abduction, hip adduction, hip flexion, each completed x 12 BLE with cues for decreased speed and full ROM.   Pt left sitting in [facility] for OT treatment in therapy gym.       Therapy Documentation Precautions:  Precautions Precautions: Fall Restrictions Weight Bearing Restrictions: No    Pain: Pain Assessment Pain Score: 0-No pain    Therapy/Group: Individual Therapy  [name] Good 10/11/2019, 6:41 PM

## 2019-10-11 NOTE — Progress Notes (Signed)
Occupational Therapy Session Note  Patient Details  Name: [name] Good MRN: [id number] Date of Birth: 09-Oct-1950  Today's Date: 10/11/2019 OT Individual Time: 1430-1500 OT Individual Time Calculation (min): 30 min    Short Term Goals: Week 1:  OT Short Term Goal 1 (Week 1): Pt will perform LB dressing with mod A overall. OT Short Term Goal 2 (Week 1): Pt will perform toileting with mod A overall. OT Short Term Goal 3 (Week 1): Pt will perform toilet transfer with mod A overall. OT Short Term Goal 4 (Week 1): Pt will perform UB dressing with min A overall.  Skilled Therapeutic Interventions/Progress Updates:    Upon entering the room, pt supine in bed and I awkward position with LEs hanging off bed. Pt reports, " My feet hurt! Help me." OT doffs B ted hose per pt request with sensitivity greatest on the top of R foot. OT notified RN of request for pain medication as well as notified PA. OT assisted pt with repositioning and then pt reports, " I think I pooped again". Pt rolling with min A and therapist checked brief and it remained clean/dry. OT discussed plain to assist pt with shower at next scheduled session tomorrow and pt was agreeable. Pt remained in bed with call bell and all needed items within reach upon exiting the room.   Therapy Documentation Precautions:  Precautions Precautions: Fall Restrictions Weight Bearing Restrictions: No    Pain: Pain Assessment Pain Score: 4    Therapy/Group: Individual Therapy  Alen Bleacher 10/11/2019, 4:25 PM

## 2019-10-11 NOTE — Progress Notes (Signed)
Patient ID: [name] Good, female   DOB: 1950/11/26, 69 y.o.   MRN: [id number]   Team Conference Report to Patient/Family  Team Conference discussion was reviewed with the patient and caregiver, including goals, any changes in plan of care and target discharge date.  Patient and caregiver express understanding and are in agreement.  The patient has a target discharge date of 10/21/19.  Andria Rhein 10/11/2019, 2:15 PM

## 2019-10-12 ENCOUNTER — Inpatient Hospital Stay (HOSPITAL_COMMUNITY): Payer: Medicare Other | Admitting: Physical Therapy

## 2019-10-12 ENCOUNTER — Inpatient Hospital Stay (HOSPITAL_COMMUNITY): Payer: Medicare Other | Admitting: Occupational Therapy

## 2019-10-12 NOTE — Progress Notes (Signed)
Occupational Therapy Session Note  Patient Details  Name: [name] Good MRN: [id number] Date of Birth: Mar 10, 1951  Today's Date: 10/12/2019 OT Individual Time: 4098-1191 and 1345-1458 OT Individual Time Calculation (min): 55 min and 73 mins   Short Term Goals: Week 1:  OT Short Term Goal 1 (Week 1): Pt will perform LB dressing with mod A overall. OT Short Term Goal 2 (Week 1): Pt will perform toileting with mod A overall. OT Short Term Goal 3 (Week 1): Pt will perform toilet transfer with mod A overall. OT Short Term Goal 4 (Week 1): Pt will perform UB dressing with min A overall.  Skilled Therapeutic Interventions/Progress Updates:    Session 1: Upon entering the room, pt seated on EOB awaiting OT arrival and combing hair. Pt agreeable to OT intervention with no c/o pain this session. OT demonstrating use of reacher to thread pants onto B feet with pt returning demonstration and needing min A for standing balance when pulling over B hips. Pt able to perform figure four position and donning B socks with increased time and therapist assist to hold LE in place. Pt threading shoes onto feet and assistance needed to fasten them. Pt transferred from chair to wheelchair with Rw and min A. Pt seated in wheelchair at sink for grooming tasks with supervision. OT placed reacher bag onto RW with education on having reacher with her at all times to use as needed at home. OT reviewed scheduled with pt who wrote schedule in memory book with plans for shower in afternoon session.   Session 2:  Upon entering the room, pt seated on EOB requesting to use bathroom. Pt standing from bed with min A and ambulating with RW into bathroom 10'. Pt able to perform clothing management and hygiene with min A for balance. Pt doffing clothing items while seated on commode for safety. Pt transferred onto shower chair with min A for bathing task. Pt standing to wash buttocks and peri area with min A overall for balance. Pt  ambulating back to sit on EOB for dressing tasks with min A to don nightgown. Pt requiring rest break secondary to fatigue. Pt then remained on EOB and used hair dryer to style hair. Pt returning to supine at end of session with call bell and all needed items within reach.   Therapy Documentation Precautions:  Precautions Precautions: Fall Restrictions Weight Bearing Restrictions: No General:   Vital Signs: Therapy Vitals Temp: 97.8 F (36.6 C) Pulse Rate: 84 Resp: 16 BP: 115/63 Patient Position (if appropriate): Lying Oxygen Therapy SpO2: 95 % O2 Device: Room Air Pain: Pain Assessment Pain Scale: 0-10 Pain Score: Asleep   Therapy/Group: Individual Therapy  [name] Good 10/12/2019, 8:58 AM

## 2019-10-12 NOTE — Progress Notes (Addendum)
[facility] PHYSICAL MEDICINE & REHABILITATION PROGRESS NOTE   Subjective/Complaints: C/o RIght foot pain  Tingling pain in hands yest pm, took oxycodone last noc, on BID gabapentin , pt confused on Neurontin vs Gabapentin, pt used to be Pharm tech  ROS: Patient denies CP, SOB, N/V/D    Objective:   No results found. Recent Labs    10/11/19 0610  WBC 6.8  HGB 13.6  HCT 41.9  PLT 328   Recent Labs    10/11/19 0610  NA 140  K 4.4  CL 107  CO2 22  GLUCOSE 110*  BUN 11  CREATININE 0.74  CALCIUM 8.9    Intake/Output Summary (Last 24 hours) at 10/12/2019 0758 Last data filed at 10/11/2019 0825 Gross per 24 hour  Intake 260 ml  Output --  Net 260 ml     Physical Exam: Vital Signs Blood pressure 115/63, pulse 84, temperature 97.8 F (36.6 C), resp. rate 16, SpO2 95 %.   General: No acute distress Mood and affect are appropriate Heart: Regular rate and rhythm no rubs murmurs or extra sounds Lungs: Clear to auscultation, breathing unlabored, no rales or wheezes Abdomen: Positive bowel sounds, soft nontender to palpation, nondistended Extremities: No clubbing, cyanosis, or edema Skin: No evidence of breakdown, no evidence of rash  Neurologic: Cranial nerves II through XII intact, motor strength is 4/5 in bilateral deltoid, bicep, tricep, grip, hip flexor, knee extensors, ankle dorsiflexor and plantar flexor  Musculoskeletal: Full range of motion in all 4 extremities. No joint swelling     Assessment/Plan: 1. Functional deficits secondary to Right thalamic  which require 3+ hours per day of interdisciplinary therapy in a comprehensive inpatient rehab setting.  Physiatrist is providing close team supervision and 24 hour management of active medical problems listed below.  Physiatrist and rehab team continue to assess barriers to discharge/monitor patient progress toward functional and medical goals  Care Tool:  Bathing  Bathing activity did not occur:  Refused Body parts bathed by patient: Right arm, Left arm, Chest, Abdomen, Right upper leg, Left upper leg, Face   Body parts bathed by helper: Front perineal area, Buttocks, Right lower leg, Left lower leg     Bathing assist Assist Level: Moderate Assistance - Patient 50 - 74%     Upper Body Dressing/Undressing Upper body dressing Upper body dressing/undressing activity did not occur (including orthotics): Refused What is the patient wearing?: Pull over shirt    Upper body assist Assist Level: Minimal Assistance - Patient > 75%    Lower Body Dressing/Undressing Lower body dressing    Lower body dressing activity did not occur: Refused What is the patient wearing?: Pants, Incontinence brief     Lower body assist Assist for lower body dressing: Total Assistance - Patient < 25%     Toileting Toileting Toileting Activity did not occur Press photographer and hygiene only): Refused  Toileting assist Assist for toileting: Maximal Assistance - Patient 25 - 49%     Transfers Chair/bed transfer  Transfers assist  Chair/bed transfer activity did not occur: Refused  Chair/bed transfer assist level: Minimal Assistance - Patient > 75%     Locomotion Ambulation   Ambulation assist   Ambulation activity did not occur: Safety/medical concerns (unable due to pain limiting WBing on feet)  Assist level: Minimal Assistance - Patient > 75% Assistive device: Walker-rolling Max distance: 44ft   Walk 10 feet activity   Assist  Walk 10 feet activity did not occur: Safety/medical concerns  Walk 50 feet activity   Assist Walk 50 feet with 2 turns activity did not occur: Safety/medical concerns         Walk 150 feet activity   Assist Walk 150 feet activity did not occur: Safety/medical concerns         Walk 10 feet on uneven surface  activity   Assist Walk 10 feet on uneven surfaces activity did not occur: Safety/medical concerns          Wheelchair     Assist Will patient use wheelchair at discharge?:  (TBD) Type of Wheelchair: Manual    Wheelchair assist level: Supervision/Verbal cueing Max wheelchair distance: 100 ft    Wheelchair 50 feet with 2 turns activity    Assist        Assist Level: Supervision/Verbal cueing   Wheelchair 150 feet activity     Assist          Blood pressure 115/63, pulse 84, temperature 97.8 F (36.6 C), resp. rate 16, SpO2 95 %.  Medical Problem List and Plan: 1.  Left side weakness secondary to right thalamic infarction             -patient may  shower             -ELOS/Goals: 2-2.5 weeks- goals supervision-   CIR PT< OT, SLP ongoing 2.  Antithrombotics: -DVT/anticoagulation: Eliquis             -antiplatelet therapy: N/A 3. Pain Management: Oxycodone and Robaxin as needed- has been c/o her feet hurting intermittently- right foot pain has hammer toes, neuropathy and PAD will order plastizote shoes  4. Mood: Melatonin 3 mg nightly ineffective for sleep  -add trazodone scheduled 50mg  which may help sleep and mood  -increase gabapentin as below  -xanax prn for anxiety  -recommend neuropsych assessment             -antipsychotic agents: N/A 5. Neuropsych: This patient is capable of making decisions on her own behalf. 6. Skin/Wound Care: Routine skin checks 7. Fluids/Electrolytes/Nutrition: Routine in and outs with follow-up chemistries 8.  LV apical thrombus.  Continue Eliquis as directed.  Follow-up outpatient Dr. Doylene Canard cardiology services plan repeat echocardiogram 3 months 9.  Hyperlipidemia.  Lipitor 10.  Restless leg syndrome.  Requip as needed- has significant Sx's-  11.  History of lumbar decompression complicated by wound dehiscence 2017.  Patient used assistive device prior to admission. 12. Hypokalemia- improved to 4.1- was 2.3 on admission with significant dehydration that has resolved.   13. LE discomfort but neurovascular exam is normal 14.   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  Constipation resolved after SSE, cont  senna no sign of obstruction  LOS: 7 days A FACE TO FACE EVALUATION WAS PERFORMED  Charlett Blake 10/12/2019, 7:58 AM

## 2019-10-12 NOTE — Progress Notes (Signed)
Physical Therapy Session Note  Patient Details  Name: [name] Good MRN: [id number] Date of Birth: 01/16/1951  Today's Date: 10/12/2019 PT Individual Time: 1005-1105 PT Individual Time Calculation (min): 60 min   Short Term Goals: Week 1:  PT Short Term Goal 1 (Week 1): Pt will perform supine<>sit with min assist PT Short Term Goal 2 (Week 1): Pt will perform sit<>stand using LRAD with mod assist of 1 PT Short Term Goal 3 (Week 1): Pt will perform bed<>chair transfer using LRAD with mod assist of 1 PT Short Term Goal 4 (Week 1): Pt will ambulate at least 15ft using LRAD with mod assist of 1  Skilled Therapeutic Interventions/Progress Updates:    Pt received sitting in WC and agreeable to PT. Pt performed WC mobility through hall x 150ft with supervision assist and min cues for safety in turns.   Transfers:  Pt performed sit<>stand with supervision assist-CGA throughout treatment with UE support on RW. Stand pivot also performed to and from nustep with RW and min assist for safety and only min cues on this day for gait pattern or AD management.   NMR:  Reciprocal foot taps on 4 inch step with BUE supported on parallel bars 2 x 6 BLE with min cues for wide BOS, improved posture and weight shifting to the L, mild R lateral hip instability noted intermittently.  Lateral/cross body reaches to obtain and place clothes pins on basketball net. Mi ncues for improve use of ankle strategy to shift weight to reach outside BOS as well as improved erect posture.  Forward/reverse gait training with RW 2 x 10 ft each direction with min  assist and moderate cues for AD management, improved step length and symmetry.  nustep reciprocal endurance training x 4 min with cues for symmetry. Pt rates Borg RPE 15/20 upon completion as well as requesting prolonged rest break due to fatigue.    Patient returned to room and left sitting in WC with call bell in reach and all needs met.      Therapy  Documentation Precautions:  Precautions Precautions: Fall Restrictions Weight Bearing Restrictions: No    Pain: Pain Assessment Pain Scale: 0-10 Pain Score: 0-No pain    Therapy/Group: Individual Therapy   E  10/12/2019, 11:01 AM  

## 2019-10-12 NOTE — Progress Notes (Signed)
Orthopedic Tech Progress Note Patient Details:  MONICA CODD Jun 20, 1950 [id number]  Called [employer] and ordered plastizote sandals  Patient ID: Octaviano Glow, female   DOB: 1950-05-05, 69 y.o.   MRN: [id number]   Gerald Stabs 10/12/2019, 9:19 AM

## 2019-10-12 NOTE — Progress Notes (Signed)
Patient asked for her requip tonight instead of her prn percocet for pain, but then a hour later asked for her percocet. She c/o burning pain to bilateral hands that she stated has not occurred before she stated the gabapentin. Her prn pain medication was given as ordered. Communication left for the provider for evaluation. Will continue to monitor

## 2019-10-12 NOTE — Plan of Care (Signed)
  Problem: Consults Goal: RH STROKE PATIENT EDUCATION Description: See Patient Education module for education specifics  Outcome: Progressing Goal: Nutrition Consult-if indicated Description: Maintain weight during admission to rehab Outcome: Progressing   Problem: RH SKIN INTEGRITY Goal: RH STG SKIN FREE OF INFECTION/BREAKDOWN Description: Remain free from skin breakdown during rehab stay with mod I assist Outcome: Progressing Goal: RH STG MAINTAIN SKIN INTEGRITY WITH ASSISTANCE Description: STG Maintain Skin Integrity With mod I Assistance. Outcome: Progressing   Problem: RH SAFETY Goal: RH STG ADHERE TO SAFETY PRECAUTIONS W/ASSISTANCE/DEVICE Description: STG Adhere to Safety Precautions With Assistance/Device wheelchair with mod I assist Outcome: Progressing   Problem: RH PAIN MANAGEMENT Goal: RH STG PAIN MANAGED AT OR BELOW PT'S PAIN GOAL Description: Pain goal of 4 on pain scale of 0-10 Outcome: Progressing

## 2019-10-13 ENCOUNTER — Inpatient Hospital Stay (HOSPITAL_COMMUNITY): Payer: Medicare Other | Admitting: Occupational Therapy

## 2019-10-13 ENCOUNTER — Encounter (HOSPITAL_COMMUNITY): Payer: Medicare Other | Admitting: Psychology

## 2019-10-13 ENCOUNTER — Inpatient Hospital Stay (HOSPITAL_COMMUNITY): Payer: Medicare Other | Admitting: Speech Pathology

## 2019-10-13 ENCOUNTER — Inpatient Hospital Stay (HOSPITAL_COMMUNITY): Payer: Medicare Other | Admitting: Physical Therapy

## 2019-10-13 NOTE — Progress Notes (Signed)
[name] PHYSICAL MEDICINE & REHABILITATION PROGRESS NOTE   Subjective/Complaints: No issues overnite ROS: Patient denies CP, SOB, N/V/D    Objective:   No results found. Recent Labs    10/11/19 0610  WBC 6.8  HGB 13.6  HCT 41.9  PLT 328   Recent Labs    10/11/19 0610  NA 140  K 4.4  CL 107  CO2 22  GLUCOSE 110*  BUN 11  CREATININE 0.74  CALCIUM 8.9    Intake/Output Summary (Last 24 hours) at 10/13/2019 0821 Last data filed at 10/12/2019 0944 Gross per 24 hour  Intake 280 ml  Output --  Net 280 ml     Physical Exam: Vital Signs Blood pressure 117/67, pulse 85, temperature 98.4 F (36.9 C), temperature source Oral, resp. rate 16, SpO2 97 %.   General: No acute distress Mood and affect are appropriate Heart: Regular rate and rhythm no rubs murmurs or extra sounds Lungs: Clear to auscultation, breathing unlabored, no rales or wheezes Abdomen: Positive bowel sounds, soft nontender to palpation, nondistended   Neurologic: Cranial nerves II through XII intact, motor strength is 4/5 in bilateral deltoid, bicep, tricep, grip, hip flexor, knee extensors, ankle dorsiflexor and plantar flexor  Musculoskeletal: Full range of motion in all 4 extremities. No joint swelling     Assessment/Plan: 1. Functional deficits secondary to Right thalamic  which require 3+ hours per day of interdisciplinary therapy in a comprehensive inpatient rehab setting.  Physiatrist is providing close team supervision and 24 hour management of active medical problems listed below.  Physiatrist and rehab team continue to assess barriers to discharge/monitor patient progress toward functional and medical goals  Care Tool:  Bathing  Bathing activity did not occur: Refused Body parts bathed by patient: Right arm, Left arm, Chest, Abdomen, Right upper leg, Left upper leg, Face, Front perineal area, Buttocks   Body parts bathed by helper: Front perineal area, Buttocks, Right lower leg,  Left lower leg     Bathing assist Assist Level: Minimal Assistance - Patient > 75%     Upper Body Dressing/Undressing Upper body dressing Upper body dressing/undressing activity did not occur (including orthotics): Refused What is the patient wearing?: Dress    Upper body assist Assist Level: Minimal Assistance - Patient > 75%    Lower Body Dressing/Undressing Lower body dressing    Lower body dressing activity did not occur: Refused What is the patient wearing?: Incontinence brief     Lower body assist Assist for lower body dressing: Total Assistance - Patient < 25%     Toileting Toileting Toileting Activity did not occur Landscape architect and hygiene only): Refused  Toileting assist Assist for toileting: Moderate Assistance - Patient 50 - 74%     Transfers Chair/bed transfer  Transfers assist  Chair/bed transfer activity did not occur: Refused  Chair/bed transfer assist level: Minimal Assistance - Patient > 75%     Locomotion Ambulation   Ambulation assist   Ambulation activity did not occur: Safety/medical concerns (unable due to pain limiting WBing on feet)  Assist level: Minimal Assistance - Patient > 75% Assistive device: Walker-rolling Max distance: 75ft   Walk 10 feet activity   Assist  Walk 10 feet activity did not occur: Safety/medical concerns        Walk 50 feet activity   Assist Walk 50 feet with 2 turns activity did not occur: Safety/medical concerns         Walk 150 feet activity   Assist Walk 150 feet  activity did not occur: Safety/medical concerns         Walk 10 feet on uneven surface  activity   Assist Walk 10 feet on uneven surfaces activity did not occur: Safety/medical concerns         Wheelchair     Assist Will patient use wheelchair at discharge?:  (TBD) Type of Wheelchair: Manual    Wheelchair assist level: Supervision/Verbal cueing Max wheelchair distance: 100 ft    Wheelchair 50 feet with  2 turns activity    Assist        Assist Level: Supervision/Verbal cueing   Wheelchair 150 feet activity     Assist          Blood pressure 117/67, pulse 85, temperature 98.4 F (36.9 C), temperature source Oral, resp. rate 16, SpO2 97 %.  Medical Problem List and Plan: 1.  Left side weakness secondary to right thalamic infarction             -patient may  shower             -ELOS/Goals: 2-2.5 weeks- goals supervision-   CIR PT< OT, SLP ongoing 2.  Antithrombotics: -DVT/anticoagulation: Eliquis             -antiplatelet therapy: N/A 3. Pain Management: Oxycodone and Robaxin as needed- has been c/o her feet hurting intermittently- right foot pain has hammer toes, neuropathy and PAD will order plastizote shoes  4. Mood: Melatonin 3 mg nightly ineffective for sleep  -add trazodone scheduled 50mg  which may help sleep and mood  -increase gabapentin as below  -xanax prn for anxiety  -recommend neuropsych assessment             -antipsychotic agents: N/A 5. Neuropsych: This patient is capable of making decisions on her own behalf. 6. Skin/Wound Care: Routine skin checks 7. Fluids/Electrolytes/Nutrition: Routine in and outs with follow-up chemistries 8.  LV apical thrombus.  Continue Eliquis as directed.  Follow-up outpatient Dr. cardiology services plan repeat echocardiogram 3 months 9.  Hyperlipidemia.  Lipitor 10.  Restless leg syndrome.  Requip as needed- has significant Sx's-  11.  History of lumbar decompression complicated by wound dehiscence 2017.  Patient used assistive device prior to admission. 12. Hypokalemia- improved to 4.1- was 2.3 on admission with significant dehydration that has resolved.   13. LE discomfort but neurovascular exam is normal 14.  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  Constipation resolved after SSE, cont  senna no sign of obstruction  LOS: 8  days A FACE TO FACE EVALUATION WAS PERFORMED  01-16-1987 10/13/2019, 8:21 AM

## 2019-10-13 NOTE — Progress Notes (Signed)
Speech Language Pathology Weekly Progress and Session Note  Patient Details  Name: [name] Good MRN: [id number] Date of Birth: 09-01-1950  Beginning of progress report period: October 06, 2019 End of progress report period: October 13, 2019  Today's Date: 10/13/2019 SLP Individual Time: 0931-1000 SLP Individual Time Calculation (min): 29 min  Short Term Goals: Week 1: SLP Short Term Goal 1 (Week 1): Pt will demonstrate ability to problem solving functional basic situations with Min A verbal/visual cues. SLP Short Term Goal 1 - Progress (Week 1): Met SLP Short Term Goal 2 (Week 1): Pt will recall new and daily information with Min A cues for use of compensatory aids/strategies. SLP Short Term Goal 2 - Progress (Week 1): Progressing toward goal SLP Short Term Goal 3 (Week 1): Pt will alternate attention between functional tasks with Min A verbal cues. SLP Short Term Goal 3 - Progress (Week 1): Met SLP Short Term Goal 4 (Week 1): Pt will anticipate 2 ADLs she will require assistance with upon her return home with Mod A cues. SLP Short Term Goal 4 - Progress (Week 1): Met    New Short Term Goals: Week 2: SLP Short Term Goal 1 (Week 2): STG=LTG due to remaining LOS  Weekly Progress Updates: Pt has made functional gains and met 3 out of 4 short term goals. Pt is currently Min-Mod assist depending on complexity of cognitive task due to impairments impacting her problem solving, error awareness, and short term memory, primarily. Pt has demonstrated improved basic problem solving and and ability to use compensatory aids for short term recall. Pt education is ongoing; no family has been present for ST sessions. Pt would continue to benefit from skilled ST while inpatient in order to maximize functional independence and reduce burden of care prior to discharge. Anticipate that pt will need 24/7 supervision at discharge in addition to [place] follow up at next level of care.       Intensity: Minumum of 1-2  x/day, 30 to 90 minutes Frequency: 3 to 5 out of 7 days Duration/Length of Stay: 10/21/19 Treatment/Interventions: Cognitive remediation/compensation;Cueing hierarchy;Functional tasks;Therapeutic Activities;Patient/family education;Internal/external aids   Daily Session  Skilled Therapeutic Interventions: Pt was seen for skilled ST targeting cognition. Upon arrival pt used memory notebook as compensatory memory strategy to recall primary ST's name Mod I. SLP facilitated session with a semi-complex monthly scheduling/calendar task, during which she required Min A verbal cues for organization, problem solving, and error awareness. She alternated her attention between functional conversation and this task with Min A verbal and visual cues for redirection. Pt left sitting edge of bed with alarm set and needs within reach. Continue per current plan of care.       Pain Pain Assessment Pain Scale: Faces Faces Pain Scale: No hurt   Therapy/Group: Individual Therapy  Arbutus Leas 10/13/2019, 7:16 AM

## 2019-10-13 NOTE — Progress Notes (Signed)
Physical Therapy Weekly Progress Note  Patient Details  Name: [name] Good MRN: [id number] Date of Birth: 1950-08-29  Beginning of progress report period: October 06, 2019 End of progress report period: October 13, 2019  Today's Date: 10/13/2019 PT Individual Time: 8250-0370 PT Individual Time Calculation (min): 24 min   Patient has met 4 of 4 short term goals.  Pt is making steady progress towards LTG. Pt has been limited by Bil LE nerve pain and L knee arthritis but has progressed to min assist transfers and gait up to 63f.   Patient continues to demonstrate the following deficits muscle weakness and muscle joint tightness, decreased cardiorespiratoy endurance, unbalanced muscle activation and decreased coordination and decreased standing balance, hemiplegia and decreased balance strategies and therefore will continue to benefit from skilled PT intervention to increase functional independence with mobility.  Patient progressing toward long term goals..  Continue plan of care.  PT Short Term Goals Week 1:  PT Short Term Goal 1 (Week 1): Pt will perform supine<>sit with min assist PT Short Term Goal 1 - Progress (Week 1): Met PT Short Term Goal 2 (Week 1): Pt will perform sit<>stand using LRAD with mod assist of 1 PT Short Term Goal 2 - Progress (Week 1): Met PT Short Term Goal 3 (Week 1): Pt will perform bed<>chair transfer using LRAD with mod assist of 1 PT Short Term Goal 3 - Progress (Week 1): Met PT Short Term Goal 4 (Week 1): Pt will ambulate at least 144fusing LRAD with mod assist of 1 PT Short Term Goal 4 - Progress (Week 1): Met Week 2:  PT Short Term Goal 1 (Week 2): STG=LTG due to ELOS  Skilled Therapeutic Interventions/Progress Updates:   Pt received sitting in EOB and agreeable to PT.  Sit<>stand from EOB with CGA-supervision assist from PT with cues for UE placement to push from Bed.   Gait training in room with RW x 3020fith min assist. Improved step length, posture and  knee stability noted on this day. Side steping R and L 2 x 5 Bilaterally with min assist and moderate cues for AD management and improved step width to the L.   Reciprocal marches 2 x 8 BLE with min assist from PT for safety as well as cues for posture and decreased speed. Sit<>supine with supervision assist from PT with cues for sequencing to reduce use of UE.   Pt left sitting EOB with call bell in reach and bed alarm set.         Therapy Documentation Precautions:  Precautions Precautions: Fall Restrictions Weight Bearing Restrictions: No General:   Vital Signs:   Pain: Pain Assessment Pain Scale: 0-10 Pain Score: 8  Pain Type: Acute pain Pain Location: Generalized Pain Descriptors / Indicators: Aching Pain Frequency: Constant Pain Onset: On-going Patients Stated Pain Goal: 4 Pain Intervention(s): Medication (See eMAR) (tylenol given) Multiple Pain Sites: No Vision/Perception     Mobility:   Locomotion :    Trunk/Postural Assessment :    Balance:   Exercises:   Other Treatments:     Therapy/Group: Individual Therapy  AusLorie Phenix18/2021, 10:22 AM

## 2019-10-13 NOTE — Progress Notes (Signed)
Occupational Therapy Session Note  Patient Details  Name: [name] Good MRN: [id number] Date of Birth: December 17, 1950  Today's Date: 10/13/2019 OT Individual Time: 1132-1200 OT Individual Time Calculation (min): 28 min    Short Term Goals: Week 1:  OT Short Term Goal 1 (Week 1): Pt will perform LB dressing with mod A overall. OT Short Term Goal 2 (Week 1): Pt will perform toileting with mod A overall. OT Short Term Goal 3 (Week 1): Pt will perform toilet transfer with mod A overall. OT Short Term Goal 4 (Week 1): Pt will perform UB dressing with min A overall.  Skilled Therapeutic Interventions/Progress Updates:    Pt greeted seated EOB and agreeable to OT treatment session. Pt reported need to go to the bathroom. Pt stood from EOB with Min A and ambulated into bathroom w/ RW and CGA. Pt sat on commode and voided bladder with smear of BM. Pt needed OT assist for peri-care 2/2 body habitus and difficulty reaching. May try toilet aid extension next time. Pt reported her brief was not staying on. OT re-fastened brief, then OT provided pt with mesh panties to put on over brief to help secure. Pt was able to reach forward to thread mesh panties over feet with min A, pt then stood to pull up pants w/ CGA. Pt ambulated to recliner in room w/ RW and CGA. Pt left seated in  Therapy Documentation Precautions:  Precautions Precautions: Fall Restrictions Weight Bearing Restrictions: No Pain: Pain Assessment Pain Scale: 0-10 Pain Score: 8  Pain Type: Acute pain Pain Location: Generalized Pain Descriptors / Indicators: Aching Pain Frequency: Constant Pain Onset: On-going Patients Stated Pain Goal: 4 Pain Intervention(s): Medication (See eMAR) (tylenol given) Multiple Pain Sites: No   Therapy/Group: Individual Therapy  Mal Amabile 10/13/2019, 12:15 PM

## 2019-10-13 NOTE — Progress Notes (Signed)
Occupational Therapy Weekly Progress Note  Patient Details  Name: [name] Good MRN: [id number] Date of Birth: 09-Apr-1951  Beginning of progress report period: 10/06/2019 End of progress report period: 10/14/2019  Today's Date: 10/13/2019 OT Individual Time: 1779-3903 OT Individual Time Calculation (min): 87 min   Patient has met 4 of 4 short term goals.   Pt has made exceptional progress at time of report. At time of evaluation, pt was unable to tolerate standing for transfers due to B foot pain. At time of report she completes toilet and shower transfers at ambulatory level with CGA-Min A using RW, pain better controlled. She requires CGA for dynamic standing balance during self care tasks as well. Pt is progressing well towards her LTGs set at CGA-supervision level overall. Continue OT POC.   Patient continues to demonstrate the following deficits: muscle weakness, decreased cardiorespiratoy endurance, decreased problem solving and decreased memory and decreased standing balance and therefore will continue to benefit from skilled OT intervention to enhance overall performance with BADL.  Patient progressing toward long term goals..  Continue plan of care.  OT Short Term Goals Week 1:  OT Short Term Goal 1 (Week 1): Pt will perform LB dressing with mod A overall. OT Short Term Goal 1 - Progress (Week 1): Met OT Short Term Goal 2 (Week 1): Pt will perform toileting with mod A overall. OT Short Term Goal 2 - Progress (Week 1): Met OT Short Term Goal 3 (Week 1): Pt will perform toilet transfer with mod A overall. OT Short Term Goal 3 - Progress (Week 1): Met OT Short Term Goal 4 (Week 1): Pt will perform UB dressing with min A overall. OT Short Term Goal 4 - Progress (Week 1): Met Week 2:  OT Short Term Goal 1 (Week 2): STGs=LTGs due to ELOS  Skilled Therapeutic Interventions/Progress Updates:    Pt greeted in the recliner, just finished lunch and requesting to shower. Ambulatory  transfer to shower chair completed using RW with CGA, vcs for forward gaze and upright posture. She bathed at sit<stand level, Min A for washing feet and CGA for dynamic standing balance while she completed pericare. Pt afterwards dressed while sitting EOB, sit<stand using RW. Instruction required for reacher use to thread LEs into underwear, questioning cues for recognizing that her underwear was on on backwards. Min cues for problem solving in regards to sequencing when to don her pad and to doff gripper socks at appropriate times to increase ease of meeting task demands. Ambulatory transfer completed to w/c placed near sink, once again using RW with CGA. While sitting, pt engaged in blow drying hair, fixing hair, oral care, and handwashing with setup assistance. Transitioned to the recliner where OT educated pt on use of sock aide to increase her independence with donning footwear. Pt able to doff/don both gripper socks with supervision assistance using AE. At end of tx pt remained in the recliner with all needs within reach and chair alarm set.   We also recorded therapeutic events in her memory journal at end of session.   Therapy Documentation Precautions:  Precautions Precautions: Fall Restrictions Weight Bearing Restrictions: No Pain: RN in during session to provide pain medicine Pain Assessment Pain Scale: 0-10 Pain Score: 6  Faces Pain Scale: No hurt Pain Type: Acute pain Pain Location: Foot Pain Orientation: Right;Left Pain Descriptors / Indicators: Burning Pain Frequency: Constant Pain Onset: On-going Patients Stated Pain Goal: 4 Pain Intervention(s): Medication (See eMAR) (percocet given) Multiple Pain Sites: No ADL:  Therapy/Group: Individual Therapy  Lee-Anne Flicker A Raine Elsass 10/13/2019, 3:45 PM

## 2019-10-13 NOTE — Consult Note (Signed)
Neuropsychological Consultation   Patient:   [name] Good   DOB:   1950-05-17  MR Number:  [id number]  Location:  [facility] [address] CENTER A [id number] [place] [address] Dept: [phone] Loc: [phone]           Date of Service:   10/13/2019  Start Time:   10 AM End Time:   11 AM  Provider/Observer:  Arley Phenix, Psy.D.       Clinical Neuropsychologist       Billing Code/Service: [phone]  Chief Complaint:    [name] is a 69 year old female with history of depression, CAD, lumbar spine fusion, RLS.  Patient presented on 10/01/2019 with left-sided weakness and slurred speech.  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  MRI of brain showed a 13 mm acute ischemic nonhemorrhagic right thalamic infarction.  Patient continues to describe issues with pain bilaterally that are not likely related to residual effects of her acute stroke or prior stroke.    Reason for Service:  HPI: [name] Good is a 69 year old right-handed female with history of depression, CAD, lumbar spine fusion complicated by wound dehiscence 2017, RLS, quit smoking 6 years ago.  Per chart review lives with spouse independent with assistive device.  Mobile home 3 steps to entry.  Presented 10/01/2019 with left-sided weakness and slurred speech.  Cranial CT scan negative for acute changes.  Chronic right parieto-occipital infarction.  CT angiogram of head and neck negative for emergent large vessel occlusion.  MRI of the brain showed a 13 mm acute ischemic nonhemorrhagic right thalamic infarction.  Echocardiogram with ejection fraction of 65% grade 1 diastolic dysfunction as well as LV thrombus.  Admission chemistries with potassium 2.3, creatinine 1.08.  Initially maintained on aspirin and Plavix for CVA prophylaxis changed to Eliquis after findings of LV thrombus.  Plans to follow-up cardiology services Dr. Algie Coffer  in regards to LV apical thrombus with repeat echocardiogram in 3 months.  Tolerating a regular diet.  Therapy evaluations completed and patient was admitted for a comprehensive rehab program.  Current Status:  The patient was alert and oriented today but did report that she is having difficulties with some aspects of memory.  She also describes considerable pain in her hands and feet particularly when she first wakes up in the morning.  This pain is bilateral and not likely specific to her either acute stroke or prior stroke.  The patient does acknowledge some motor deficits and sensation deficits more prominent on her left side that could be due to the thalamic infarction.  The patient describes significant depression/bereavement as her mother passed away in the middle of Sep 23, 2022 and she is still not had a chance to fully grieve or deal with the loss.  She and her brother were in the process of trying to work out complicated issues around the [employer] estate and there are still outstanding issues that have to be solved that the patient is feeling overwhelmed being able to complete.  Behavioral Observation: [name] Good  presents as a 69 y.o.-year-old Right Caucasian Female who appeared her stated age. her dress was Appropriate and she was Well Groomed and her manners were Appropriate to the situation.  her participation was indicative of Redirectable behaviors.  There were any physical disabilities noted.  she displayed an appropriate level of cooperation and motivation.     Interactions:    Active Redirectable  Attention:   abnormal and  attention span appeared shorter than expected for age  Memory:   abnormal; remote memory intact, recent memory impaired  Visuo-spatial:  not examined but the patient has had a previous parietal occipital infarction and reports that in the not too distant past that she had a time where she had an acute vision change and the patient had talked with her PCP about this and  had actually been scheduled for a CT scan to be conducted but had not been completed before she had the stroke event.  Speech (Volume):  normal  Speech:   normal; normal  Thought Process:  Coherent and Relevant  Though Content:  WNL; not suicidal and not homicidal  Orientation:   person, place and time/date  Judgment:   Fair  Planning:   Poor  Affect:    Depressed  Mood:    Dysphoric  Insight:   Fair  Intelligence:   normal  Medical History:   Past Medical History:  Diagnosis Date  . Anxiety   . Arthritis   . Depression   . GERD (gastroesophageal reflux disease)   . Myocardial infarction ([place]) 89   no visits to cardiac dr(thomas kelly) since 92  . Stroke ([place])   . Wound dehiscence    lumbar   Psychiatric History:  Patient does have a prior history of anxiety and depression and there has been an acute exacerbation of her depressive symptomatology with combination of her recent stroke and now extended hospital stay as well as the death of her mother approximately 1 month ago.  Family Med/Psych History: History reviewed. No pertinent family history.  Risk of Suicide/Violence: virtually non-existent patient denies any suicidal or homicidal ideation.  Impression/DX:  [name] Good is a 69 year old female with history of depression, CAD, lumbar spine fusion, RLS.  Patient presented on 10/01/2019 with left-sided weakness and slurred speech.  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  MRI of brain showed a 13 mm acute ischemic nonhemorrhagic right thalamic infarction.  Patient continues to describe issues with pain bilaterally that are not likely related to residual effects of her acute stroke or prior stroke.    The patient was alert and oriented today but did report that she is having difficulties with some aspects of memory.  She also describes considerable pain in her hands and feet particularly when she first wakes up in the morning.  This pain is  bilateral and not likely specific to her either acute stroke or prior stroke.  The patient does acknowledge some motor deficits and sensation deficits more prominent on her left side that could be due to the thalamic infarction.  The patient describes significant depression/bereavement as her mother passed away in the middle of 09/11/2022 and she is still not had a chance to fully grieve or deal with the loss.  She and her brother were in the process of trying to work out complicated issues around the [employer] estate and there are still outstanding issues that have to be solved that the patient is feeling overwhelmed being able to complete.  Disposition/Plan:  Will follow up with the patient next week.  She is dealing with considerable depressive symptomatology that has become exacerbated with acute on chronic symptoms.  Diagnosis:    Right thalamic infarction [facility]) - Plan: Ambulatory referral to Neurology         Electronically Signed   _______________________ Ilean Skill, Psy.D.

## 2019-10-14 ENCOUNTER — Inpatient Hospital Stay (HOSPITAL_COMMUNITY): Payer: Medicare Other | Admitting: Physical Therapy

## 2019-10-14 ENCOUNTER — Inpatient Hospital Stay (HOSPITAL_COMMUNITY): Payer: Medicare Other | Admitting: Occupational Therapy

## 2019-10-14 MED ORDER — ROPINIROLE HCL 1 MG PO TABS
3.0000 mg | ORAL_TABLET | Freq: Three times a day (TID) | ORAL | Status: DC | PRN
  Administered 2019-10-14 – 2019-10-20 (×8): 3 mg via ORAL
  Filled 2019-10-14 (×10): qty 3

## 2019-10-14 NOTE — Progress Notes (Signed)
[place] PHYSICAL MEDICINE & REHABILITATION PROGRESS NOTE   Subjective/Complaints: States she had a bad night and got her medicine too late (Requip). Says she typically takes one at 4-5pm and the other before bedtime. Also complains of cold hands and feet- feels her room temperature is to cold.   ROS: Patient denies CP, SOB, N/V/D    Objective:   No results found. No results for input(s): WBC, HGB, HCT, PLT in the last 72 hours. No results for input(s): NA, K, CL, CO2, GLUCOSE, BUN, CREATININE, CALCIUM in the last 72 hours. No intake or output data in the 24 hours ending 10/14/19 1010   Physical Exam: Vital Signs Blood pressure 106/64, pulse 84, temperature 98.2 F (36.8 C), temperature source Oral, resp. rate 16, SpO2 94 %.  General:  No apparent distress HEENT: Head is normocephalic, atraumatic, PERRLA, EOMI, sclera anicteric, oral mucosa pink and moist, dentition intact, ext ear canals clear,  Neck: Supple without JVD or lymphadenopathy Heart: Reg rate and rhythm. No murmurs rubs or gallops Chest: CTA bilaterally without wheezes, rales, or rhonchi; no distress Abdomen: Soft, non-tender, non-distended, bowel sounds positive. Extremities: No clubbing, cyanosis, or edema. Pulses are 2+ Skin: Clean and intact without signs of breakdown  Neurologic: Cranial nerves II through XII intact, motor strength is 4/5 in bilateral deltoid, bicep, tricep, grip, hip flexor, knee extensors, ankle dorsiflexor and plantar flexor  Musculoskeletal: Full range of motion in all 4 extremities. No joint swelling     Assessment/Plan: 1. Functional deficits secondary to Right thalamic  which require 3+ hours per day of interdisciplinary therapy in a comprehensive inpatient rehab setting.  Physiatrist is providing close team supervision and 24 hour management of active medical problems listed below.  Physiatrist and rehab team continue to assess barriers to discharge/monitor patient progress  toward functional and medical goals  Care Tool:  Bathing  Bathing activity did not occur: Refused Body parts bathed by patient: Right arm, Left arm, Chest, Abdomen, Right upper leg, Left upper leg, Face, Front perineal area, Buttocks   Body parts bathed by helper: Right arm, Left arm     Bathing assist Assist Level: Minimal Assistance - Patient > 75%     Upper Body Dressing/Undressing Upper body dressing Upper body dressing/undressing activity did not occur (including orthotics): Refused What is the patient wearing?: Dress    Upper body assist Assist Level: Contact Guard/Touching assist    Lower Body Dressing/Undressing Lower body dressing    Lower body dressing activity did not occur: Refused What is the patient wearing?: Underwear/pull up     Lower body assist Assist for lower body dressing: Contact Guard/Touching assist     Toileting Toileting Toileting Activity did not occur (Clothing management and hygiene only): Refused  Toileting assist Assist for toileting: Moderate Assistance - Patient 50 - 74%     Transfers Chair/bed transfer  Transfers assist  Chair/bed transfer activity did not occur: Refused  Chair/bed transfer assist level: Minimal Assistance - Patient > 75%     Locomotion Ambulation   Ambulation assist   Ambulation activity did not occur: Safety/medical concerns (unable due to pain limiting WBing on feet)  Assist level: Minimal Assistance - Patient > 75% Assistive device: Walker-rolling Max distance: 62ft   Walk 10 feet activity   Assist  Walk 10 feet activity did not occur: Safety/medical concerns        Walk 50 feet activity   Assist Walk 50 feet with 2 turns activity did not occur: Safety/medical concerns  Walk 150 feet activity   Assist Walk 150 feet activity did not occur: Safety/medical concerns         Walk 10 feet on uneven surface  activity   Assist Walk 10 feet on uneven surfaces activity did not  occur: Safety/medical concerns         Wheelchair     Assist Will patient use wheelchair at discharge?:  (TBD) Type of Wheelchair: Manual    Wheelchair assist level: Supervision/Verbal cueing Max wheelchair distance: 100 ft    Wheelchair 50 feet with 2 turns activity    Assist        Assist Level: Supervision/Verbal cueing   Wheelchair 150 feet activity     Assist          Blood pressure 106/64, pulse 84, temperature 98.2 F (36.8 C), temperature source Oral, resp. rate 16, SpO2 94 %.  Medical Problem List and Plan: 1.  Left side weakness secondary to right thalamic infarction             -patient may  shower             -ELOS/Goals: 2-2.5 weeks- goals supervision-   Continue CIR PT< OT, SLP ongoing 2.  Antithrombotics: -DVT/anticoagulation: Eliquis             -antiplatelet therapy: N/A 3. Pain Management: Oxycodone and Robaxin as needed- has been c/o her feet hurting intermittently- right foot pain has hammer toes, neuropathy and PAD will order plastizote shoes. Well controlled.  4. Mood: Melatonin 3 mg nightly ineffective for sleep  -add trazodone scheduled 50mg  which may help sleep and mood  -increase gabapentin as below  -xanax prn for anxiety  -recommend neuropsych assessment             -antipsychotic agents: N/A 5. Neuropsych: This patient is capable of making decisions on her own behalf. 6. Skin/Wound Care: Routine skin checks 7. Fluids/Electrolytes/Nutrition: Routine in and outs with follow-up chemistries 8.  LV apical thrombus.  Continue Eliquis as directed.  Follow-up outpatient Dr. Doylene Canard cardiology services plan repeat echocardiogram 3 months 9.  Hyperlipidemia.  Lipitor 10.  Restless leg syndrome.  Requip as needed- has significant Sx's- increased to 3mg  TID prn. Advised her to ask for medication 30 minutes prior to when she usually takes it.  11.  History of lumbar decompression complicated by wound dehiscence 2017.  Patient used  assistive device prior to admission. 12. Hypokalemia- improved to 4.1- was 2.3 on admission with significant dehydration that has resolved.   13. LE discomfort but neurovascular exam is normal 14.  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  Constipation resolved after SSE, cont  senna no sign of obstruction  16. Cold hands and feet: Advised holding hot packs and requesting blanket if feeling cold in room.  LOS: 9 days A FACE TO FACE EVALUATION WAS PERFORMED  Bahja Bence P Yakima Kreitzer 10/14/2019, 10:10 AM

## 2019-10-14 NOTE — Progress Notes (Signed)
Occupational Therapy Session Note  Patient Details  Name: [name] Good MRN: [id number] Date of Birth: 02/25/1951  Today's Date: 10/14/2019 OT Individual Time: 1255-1421 OT Individual Time Calculation (min): 86 min   Skilled Therapeutic Interventions/Progress Updates:    Pt greeted in her w/c, teary, stating she felt "stressed" but unable to say why. RN in at start of session to provide pain medicine per request. Per pt, her foot pain kept her from sleeping last night. Tx focus was placed on gentle UB strengthening and endurance while addressing psychosocial wellness. [facility]. She was given opportunities to ambulate with device, but opted to self propel the w/c instead. Pt navigated in the gift shop, food court, and outdoor patio areas, Min A for navigation through tight spaces and Mod A for going up inclines. When pt returned to room she reported needing to void bowels. Ambulatory transfer to toilet completed using RW with CGA. CGA for toileting tasks with pt having B+B void. She used the seasaw method to complete perihygiene with wash cloth coming out clean after OT spot-checked her for thoroughness. Pt then wanted to ambulate to bed and did so with CGA using device, vcs for upright posture. Pt returned to flat bed once OT doffed her shoes and set her up with hand sanitizer. Left pt in bed with all needs within reach and bed alarm set.   Therapy Documentation Precautions:  Precautions Precautions: Fall Restrictions Weight Bearing Restrictions: No Pain: Pain Assessment Pain Scale: 0-10 Pain Score: 8  Pain Type: Acute pain Pain Location: Foot Pain Orientation: Right;Left Pain Descriptors / Indicators: Burning;Aching Pain Frequency: Constant Pain Onset: On-going Pain Intervention(s): Medication (See eMAR) ADL:       Therapy/Group: Individual Therapy  Marrian Bells A Monterius Rolf 10/14/2019, 4:12 PM

## 2019-10-14 NOTE — Progress Notes (Signed)
Physical Therapy Session Note  Patient Details  Name: [name] Good MRN: [id number] Date of Birth: 09-03-1950  Today's Date: 10/14/2019 PT Individual Time: 1015-1110 PT Individual Time Calculation (min): 55 min   Short Term Goals: Week 2:  PT Short Term Goal 1 (Week 2): STG=LTG due to ELOS  Skilled Therapeutic Interventions/Progress Updates:   Pt received sitting in EOB and agreeable to PT. Pt performed stand pivot transfer to [facility] with supervision assist from PT for safety and min cues for AD management in turn. Pt transported to rehab gym in [facility]. Gait training with RW x 89f with min assist and 733fwith CGA. Cues for posture and to keep COM within RW base. Improved step length noted as well as no knee instability. Mild R LE pain in the forefoot. Pt instructed pt in dynamic balance training to perform semitandem stance 2 x 15 sec with BUE support and 2 x 10 sec without UE support. Pt reports need for BM. Toilet transfer with RW and CGA. Pt able to perform cothing management, but required mod assist for adequate pericare. [facility] returned to [facility] and all needs met.  Throughout session, pt required multiple prolonged rest breaks due to fatigue.        Therapy Documentation Precautions:  Precautions Precautions: Fall Restrictions Weight Bearing Restrictions: No Pain: Pain Assessment Pain Scale: 0-10 Pain Score: 8  Pain Type: Acute pain Pain Location: Foot Pain Orientation: Right;Left Pain Descriptors / Indicators: Burning;Aching Pain Frequency: Constant Pain Onset: On-going Pain Intervention(s): Medication (See eMAR)     Therapy/Group: Individual Therapy  AuLorie Phenix/19/2021, 3:14 PM

## 2019-10-15 NOTE — Progress Notes (Signed)
[place] PHYSICAL MEDICINE & REHABILITATION PROGRESS NOTE   Subjective/Complaints: Got Requip on time yesterday.  Denies pain Slept well last night Had BM today  ROS: Patient denies CP, SOB, N/V/D    Objective:   No results found. No results for input(s): WBC, HGB, HCT, PLT in the last 72 hours. No results for input(s): NA, K, CL, CO2, GLUCOSE, BUN, CREATININE, CALCIUM in the last 72 hours.  Intake/Output Summary (Last 24 hours) at 10/15/2019 1640 Last data filed at 10/15/2019 1100 Gross per 24 hour  Intake 240 ml  Output --  Net 240 ml     Physical Exam: Vital Signs Blood pressure 135/68, pulse 89, temperature 98.4 F (36.9 C), resp. rate 16, SpO2 98 %.   General: Alert and oriented x 3, No apparent distress HEENT: Head is normocephalic, atraumatic, PERRLA, EOMI, sclera anicteric, oral mucosa pink and moist, dentition intact, ext ear canals clear,  Neck: Supple without JVD or lymphadenopathy Heart: Reg rate and rhythm. No murmurs rubs or gallops Chest: CTA bilaterally without wheezes, rales, or rhonchi; no distress Abdomen: Soft, non-tender, non-distended, bowel sounds positive. Extremities: No clubbing, cyanosis, or edema. Pulses are 2+    Neurologic: Cranial nerves II through XII intact, motor strength is 4/5 in bilateral deltoid, bicep, tricep, grip, hip flexor, knee extensors, ankle dorsiflexor and plantar flexor  Musculoskeletal: Full range of motion in all 4 extremities. No joint swelling     Assessment/Plan: 1. Functional deficits secondary to Right thalamic  which require 3+ hours per day of interdisciplinary therapy in a comprehensive inpatient rehab setting.  Physiatrist is providing close team supervision and 24 hour management of active medical problems listed below.  Physiatrist and rehab team continue to assess barriers to discharge/monitor patient progress toward functional and medical goals  Care Tool:  Bathing  Bathing activity did not  occur: Refused Body parts bathed by patient: Right arm, Left arm, Chest, Abdomen, Right upper leg, Left upper leg, Face, Front perineal area, Buttocks   Body parts bathed by helper: Right arm, Left arm     Bathing assist Assist Level: Minimal Assistance - Patient > 75%     Upper Body Dressing/Undressing Upper body dressing Upper body dressing/undressing activity did not occur (including orthotics): Refused What is the patient wearing?: Dress    Upper body assist Assist Level: Contact Guard/Touching assist    Lower Body Dressing/Undressing Lower body dressing    Lower body dressing activity did not occur: Refused What is the patient wearing?: Underwear/pull up     Lower body assist Assist for lower body dressing: Contact Guard/Touching assist     Toileting Toileting Toileting Activity did not occur Press photographer and hygiene only): Refused  Toileting assist Assist for toileting: Contact Guard/Touching assist     Transfers Chair/bed transfer  Transfers assist  Chair/bed transfer activity did not occur: Refused  Chair/bed transfer assist level: Supervision/Verbal cueing     Locomotion Ambulation   Ambulation assist   Ambulation activity did not occur: Safety/medical concerns (unable due to pain limiting WBing on feet)  Assist level: Minimal Assistance - Patient > 75% Assistive device: Walker-rolling Max distance: 60   Walk 10 feet activity   Assist  Walk 10 feet activity did not occur: Safety/medical concerns  Assist level: Minimal Assistance - Patient > 75%     Walk 50 feet activity   Assist Walk 50 feet with 2 turns activity did not occur: Safety/medical concerns  Assist level: Minimal Assistance - Patient > 75%  Walk 150 feet activity   Assist Walk 150 feet activity did not occur: Safety/medical concerns         Walk 10 feet on uneven surface  activity   Assist Walk 10 feet on uneven surfaces activity did not occur:  Safety/medical concerns         Wheelchair     Assist Will patient use wheelchair at discharge?:  (TBD) Type of Wheelchair: Manual    Wheelchair assist level: Supervision/Verbal cueing Max wheelchair distance: 100 ft    Wheelchair 50 feet with 2 turns activity    Assist        Assist Level: Supervision/Verbal cueing   Wheelchair 150 feet activity     Assist          Blood pressure 135/68, pulse 89, temperature 98.4 F (36.9 C), resp. rate 16, SpO2 98 %.  Medical Problem List and Plan: 1.  Left side weakness secondary to right thalamic infarction             -patient may  shower             -ELOS/Goals: 2-2.5 weeks- goals supervision-   Continue CIR PT< OT, SLP ongoing 2.  Antithrombotics: -DVT/anticoagulation: Eliquis             -antiplatelet therapy: N/A 3. Pain Management: Oxycodone and Robaxin as needed- has been c/o her feet hurting intermittently- right foot pain has hammer toes, neuropathy and PAD will order plastizote shoes. Well controlled.  4. Mood: Melatonin 3 mg nightly ineffective for sleep  -add trazodone scheduled 50mg  which may help sleep and mood  -increase gabapentin as below  -xanax prn for anxiety  -recommend neuropsych assessment  -well controlled             -antipsychotic agents: N/A 5. Neuropsych: This patient is capable of making decisions on her own behalf. 6. Skin/Wound Care: Routine skin checks 7. Fluids/Electrolytes/Nutrition: Routine in and outs with follow-up chemistries 8.  LV apical thrombus.  Contiune Eliquis as directed.  Follow-up outpatient Dr. Doylene Canard cardiology services plan repeat echocardiogram 3 months 9.  Hyperlipidemia.  Lipitor 10.  Restless leg syndrome.  Requip as needed- has significant Sx's- increased to 3mg  TID prn. Advised her to ask for medication 30 minutes prior to when she usually takes it. Better controlled with this increase 11.  History of lumbar decompression complicated by wound dehiscence  2017.  Patient used assistive device prior to admission. 12. Hypokalemia- improved to 4.1- was 2.3 on admission with significant dehydration that has resolved.   13. LE discomfort but neurovascular exam is normal 14.  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  Constipation resolved after SSE, cont  senna no sign of obstruction  16. Cold hands and feet: Advised holding hot packs and requesting blanket if feeling cold in room.  LOS: 10 days A FACE TO FACE EVALUATION WAS PERFORMED  [name] Good 10/15/2019, 4:40 PM

## 2019-10-16 ENCOUNTER — Inpatient Hospital Stay (HOSPITAL_COMMUNITY): Payer: Medicare Other | Admitting: Occupational Therapy

## 2019-10-16 ENCOUNTER — Inpatient Hospital Stay (HOSPITAL_COMMUNITY): Payer: Medicare Other

## 2019-10-16 NOTE — Progress Notes (Signed)
[place] PHYSICAL MEDICINE & REHABILITATION PROGRESS NOTE   Subjective/Complaints:  Feels like "bricks on feet" , no LE pain   ROS: Patient denies CP, SOB, N/V/D    Objective:   No results found. No results for input(s): WBC, HGB, HCT, PLT in the last 72 hours. No results for input(s): NA, K, CL, CO2, GLUCOSE, BUN, CREATININE, CALCIUM in the last 72 hours.  Intake/Output Summary (Last 24 hours) at 10/16/2019 0803 Last data filed at 10/15/2019 1100 Gross per 24 hour  Intake 240 ml  Output 2 ml  Net 238 ml     Physical Exam: Vital Signs Blood pressure (!) 117/104, pulse 96, temperature 97.8 F (36.6 C), resp. rate 16, SpO2 97 %.  General: No acute distress Mood and affect are appropriate Heart: Regular rate and rhythm no rubs murmurs or extra sounds Lungs: Clear to auscultation, breathing unlabored, no rales or wheezes Abdomen: Positive bowel sounds, soft nontender to palpation, nondistended Extremities: No clubbing, cyanosis, or edema Skin: No evidence of breakdown, no evidence of rash     Neurologic: Cranial nerves II through XII intact, motor strength is 4/5 in bilateral deltoid, bicep, tricep, grip, hip flexor, knee extensors, ankle dorsiflexor and plantar flexor  Musculoskeletal: Full range of motion in all 4 extremities. No joint swelling     Assessment/Plan: 1. Functional deficits secondary to Right thalamic infarct   which require 3+ hours per day of interdisciplinary therapy in a comprehensive inpatient rehab setting.  Physiatrist is providing close team supervision and 24 hour management of active medical problems listed below.  Physiatrist and rehab team continue to assess barriers to discharge/monitor patient progress toward functional and medical goals  Care Tool:  Bathing  Bathing activity did not occur: Refused Body parts bathed by patient: Right arm, Left arm, Chest, Abdomen, Right upper leg, Left upper leg, Face, Front perineal area, Buttocks    Body parts bathed by helper: Right arm, Left arm     Bathing assist Assist Level: Minimal Assistance - Patient > 75%     Upper Body Dressing/Undressing Upper body dressing Upper body dressing/undressing activity did not occur (including orthotics): Refused What is the patient wearing?: Dress    Upper body assist Assist Level: Contact Guard/Touching assist    Lower Body Dressing/Undressing Lower body dressing    Lower body dressing activity did not occur: Refused What is the patient wearing?: Underwear/pull up     Lower body assist Assist for lower body dressing: Contact Guard/Touching assist     Toileting Toileting Toileting Activity did not occur Press photographer and hygiene only): Refused  Toileting assist Assist for toileting: Contact Guard/Touching assist     Transfers Chair/bed transfer  Transfers assist  Chair/bed transfer activity did not occur: Refused  Chair/bed transfer assist level: Supervision/Verbal cueing     Locomotion Ambulation   Ambulation assist   Ambulation activity did not occur: Safety/medical concerns (unable due to pain limiting WBing on feet)  Assist level: Minimal Assistance - Patient > 75% Assistive device: Walker-rolling Max distance: 60   Walk 10 feet activity   Assist  Walk 10 feet activity did not occur: Safety/medical concerns  Assist level: Minimal Assistance - Patient > 75%     Walk 50 feet activity   Assist Walk 50 feet with 2 turns activity did not occur: Safety/medical concerns  Assist level: Minimal Assistance - Patient > 75%      Walk 150 feet activity   Assist Walk 150 feet activity did not occur: Safety/medical concerns  Walk 10 feet on uneven surface  activity   Assist Walk 10 feet on uneven surfaces activity did not occur: Safety/medical concerns         Wheelchair     Assist Will patient use wheelchair at discharge?:  (TBD) Type of Wheelchair: Manual    Wheelchair  assist level: Supervision/Verbal cueing Max wheelchair distance: 100 ft    Wheelchair 50 feet with 2 turns activity    Assist        Assist Level: Supervision/Verbal cueing   Wheelchair 150 feet activity     Assist          Blood pressure (!) 117/104, pulse 96, temperature 97.8 F (36.6 C), resp. rate 16, SpO2 97 %.  Medical Problem List and Plan: 1.  Left side weakness secondary to right thalamic infarction             -patient may  shower             -ELOS/Goals6/25, will need Sup - SW to f/u   Continue CIR PT< OT, SLP ongoing 2.  Antithrombotics: -DVT/anticoagulation: Eliquis             -antiplatelet therapy: N/A 3. Pain Management: Oxycodone and Robaxin as needed- has been c/o her feet hurting intermittently- right foot pain has hammer toes, neuropathy and PAD will order plastizote shoes. Well controlled.  4. Mood: Melatonin 3 mg nightly ineffective for sleep  -add trazodone scheduled 50mg  which may help sleep and mood  -increase gabapentin as below  -xanax prn for anxiety, pt states she feels drowsy day after taking this med request D/C   -ask SW to f/u on dispo issues   -             -antipsychotic agents: N/A 5. Neuropsych: This patient is capable of making decisions on her own behalf. 6. Skin/Wound Care: Routine skin checks 7. Fluids/Electrolytes/Nutrition: Routine in and outs with follow-up chemistries 8.  LV apical thrombus.  Contiune Eliquis as directed.  Follow-up outpatient Dr. Doylene Canard cardiology services plan repeat echocardiogram 3 months 9.  Hyperlipidemia.  Lipitor 10.  Restless leg syndrome.  Requip as needed- has significant Sx's- increased to 3mg  TID prn. Advised her to ask for medication 30 minutes prior to when she usually takes it. Better controlled with this increase 11.  History of lumbar decompression complicated by wound dehiscence 2017.  Patient used assistive device prior to admission. 12. Hypokalemia- improved to 4.1- was 2.3 on  admission with significant dehydration that has resolved.   13. LE discomfort but neurovascular exam is normal 14.  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  Constipation resolved after SSE, cont  senna no sign of obstruction   LOS: 11 days A FACE TO FACE EVALUATION WAS PERFORMED  Charlett Blake 10/16/2019, 8:03 AM

## 2019-10-16 NOTE — Progress Notes (Signed)
Occupational Therapy Session Note  Patient Details  Name: [name] Good MRN: [id number] Date of Birth: 08-02-1950  Today's Date: 10/16/2019 OT Individual Time: 6606-3016 OT Individual Time Calculation (min): 55 min   Short Term Goals: Week 2:  OT Short Term Goal 1 (Week 2): STGs=LTGs due to ELOS  Skilled Therapeutic Interventions/Progress Updates:    Pt greeted EOB, just finished breakfast, appearing very sleepy which pt attributed to taking Xanex last night. She wanted to return to bed vs transfer to the recliner. Pt able to boost herself up in bed using LEs when bed was placed in trendelenburg position. She was c/o neck, shoulder, and hand stiffness/soreness. Guided her through gentle UB stretches to address this with pt needing cuing to keep her eyes open at times and for sustained participation due to nodding off. OT placed a peppermint scented cotton ball in her pillowcase to increase alertness with pt consent. She was more alert during hand strengthening activity after using tan theraputty, pt instructed to "make shapes" suggested by therapist and also shapes that she came up with. Note that pt was teary regarding her upcoming d/c, concerned about family's ability to provide care for her. Relayed her concerns to SW. At end of session pt remained comfortably in bed with all needs within reach and bed alarm set.   Pt also had 1 episode of increased nausea, provided her with an antinausea aromatherapy blend with nausea reportedly absolving as tx progressed.  Therapy Documentation Precautions:  Precautions Precautions: Fall Restrictions Weight Bearing Restrictions: No Pain: in UB and also in back, pt premedicated for pain.    ADL:       Therapy/Group: Individual Therapy  Iwalani Templeton A Kazaria Gaertner 10/16/2019, 12:11 PM

## 2019-10-16 NOTE — Plan of Care (Signed)
  Problem: RH Simple Meal Prep Goal: LTG Patient will perform simple meal prep w/assist (OT) Description: LTG: Patient will perform simple meal prep with assistance, with/without cues (OT). Outcome: Not Applicable Note: Goal d/c as pt is delegating IADL responsibilities to family at time of d/c    Problem: RH Laundry Goal: LTG Patient will perform laundry w/assist, cues (OT) Description: LTG: Patient will perform laundry with assistance, with/without cues (OT). Outcome: Not Applicable Note: Goal d/c as pt is delegating IADL responsibilities to family at time of d/c

## 2019-10-16 NOTE — Progress Notes (Signed)
Physical Therapy Session Note  Patient Details  Name: [name] Good MRN: [id number] Date of Birth: October 30, 1950  Today's Date: 10/16/2019 PT Individual Time: 1130-1158 PT Individual Time Calculation (min): 28 min   Short Term Goals: Week 1:  PT Short Term Goal 1 (Week 1): Pt will perform supine<>sit with min assist PT Short Term Goal 1 - Progress (Week 1): Met PT Short Term Goal 2 (Week 1): Pt will perform sit<>stand using LRAD with mod assist of 1 PT Short Term Goal 2 - Progress (Week 1): Met PT Short Term Goal 3 (Week 1): Pt will perform bed<>chair transfer using LRAD with mod assist of 1 PT Short Term Goal 3 - Progress (Week 1): Met PT Short Term Goal 4 (Week 1): Pt will ambulate at least 33f using LRAD with mod assist of 1 PT Short Term Goal 4 - Progress (Week 1): Met Week 2:  PT Short Term Goal 1 (Week 2): STG=LTG due to ELOS  Skilled Therapeutic Interventions/Progress Updates:   Received pt supine in bed, pt agreeable to therapy, and reported pain in low back but did not state pain level (premedicated). Therapist provided hot packs to pt at end of session per request. Repositioning, rest breaks, and distraction done to reduce pain. Session with emphasis on functional mobility/transfers, generalized strengthening, dynamic standing balance/coordination, ambulation, L attention, and improved activity tolerance. Pt transferred supine<>sitting EOB with supervision. Pt transferred bed<>WC stand<>pivot without AD CGA and rinsed mouth with mouthwash at sink with supervision. [facility] time [facility] purposes. Pt transferred sit<>stand CGA and ambulated 2297fwith RW CGA with minimal cues for L attention. Pt reported increased pain in bilateral feet. Therapist provided and donned shoes max A and pt reported pain relief. Pt ambulated additional [id number] RW CGA and performed bilateral UE and LE strengthening on Nustep at workload 3 for 5 minutes for a total of 370  steps. Stand<>pivot Nustep<>WC CGA and [facility] Concluded session with pt sitting in WC, needs within reach, and seatbelt alarm on.     Therapy Documentation Precautions:  Precautions Precautions: Fall Restrictions Weight Bearing Restrictions: No  Therapy/Group: Individual Therapy AnnAlfonse Alpers, DPT   10/16/2019, 7:32 AM

## 2019-10-16 NOTE — Progress Notes (Signed)
Occupational Therapy Session Note  Patient Details  Name: [name] Good MRN: [id number] Date of Birth: [birth year]  Today's Date: 10/16/2019 OT Individual Time: 3220-2542 OT Individual Time Calculation (min): 60 min    Short Term Goals: Week 2:  OT Short Term Goal 1 (Week 2): STGs=LTGs due to ELOS  Skilled Therapeutic Interventions/Progress Updates:    Upon entering the room, Pt seated in chair with no c/o pain and NT present in room changing bed linens. Pt reports feeling very emotional today with "family issues". OT provided therapeutic use of self but redirected pt to shower. Pt agreeable to shower and ambulating into bathroom with CGA and use of RW for toileting needs. Pt doffing clothing items while seated on commode with supervision overall. Pt transferred into shower seat and bathing self with min A for B feet and to stand to wash buttocks and peri area. Pt exited the room and seated on EOB for dressing tasks. Pt needing min A to fasten bra but pulls shirt on with set up A. Pt utilized reacher to thread pants onto B feet and pulled over B hips with min guard for standing balance. Pt combing hair and then writing in memory book with min cuing. Pt remained seated on EOB with all needs within reach and call bell. Bed alarm activated.   Therapy Documentation Precautions:  Precautions Precautions: Fall Restrictions Weight Bearing Restrictions: No General:   Vital Signs:  Pain: Pain Assessment Pain Score: 3  Other Treatments:    Therapy/Group: Individual Therapy  Alen Bleacher 10/16/2019, 11:30 AM

## 2019-10-16 NOTE — Progress Notes (Signed)
Speech Language Pathology Daily Session Note  Patient Details  Name: DAKAYLA DISANTI MRN: [id number] Date of Birth: [birth year]  Today's Date: 10/16/2019 SLP Individual Time: 2072-1828 SLP Individual Time Calculation (min): 41 min  Short Term Goals: Week 2: SLP Short Term Goal 1 (Week 2): STG=LTG due to remaining LOS  Skilled Therapeutic Interventions: Skilled ST services focused on cognitive skills. SLP facilitated recall of medication given list, pt demonstrated mod I. SLP also facilitated completion of QID pill organizer task with current medication pt demonstrated mod I for problem solving remaining medication prescribed x1 a day. Pt became emotional pertaining to family situations and discharge plans, SLP provided emotional support. Pt was left in room with call bell within reach and bed alarm set. ST recommends to continue skilled ST services.      Pain Pain Assessment Pain Scale: 0-10 Pain Score: 0-No pain  Therapy/Group: Individual Therapy  Terry Bolotin  [facility] 10/16/2019, 4:31 PM

## 2019-10-16 NOTE — Progress Notes (Signed)
Patient ID: [name] Good, female   DOB: 1950/12/27, 69 y.o.   MRN: [id number]   Rolling Walker and TTB order placed through [employer].

## 2019-10-16 NOTE — Plan of Care (Signed)
  Problem: Consults Goal: RH STROKE PATIENT EDUCATION Description: See Patient Education module for education specifics  Outcome: Progressing Goal: Nutrition Consult-if indicated Description: Maintain weight during admission to rehab Outcome: Progressing   Problem: RH SKIN INTEGRITY Goal: RH STG SKIN FREE OF INFECTION/BREAKDOWN Description: Remain free from skin breakdown during rehab stay with mod I assist Outcome: Progressing Goal: RH STG MAINTAIN SKIN INTEGRITY WITH ASSISTANCE Description: STG Maintain Skin Integrity With mod I Assistance. Outcome: Progressing   Problem: RH SAFETY Goal: RH STG ADHERE TO SAFETY PRECAUTIONS W/ASSISTANCE/DEVICE Description: STG Adhere to Safety Precautions With Assistance/Device wheelchair with mod I assist Outcome: Progressing   Problem: RH PAIN MANAGEMENT Goal: RH STG PAIN MANAGED AT OR BELOW PT'S PAIN GOAL Description: Pain goal of 4 on pain scale of 0-10 Outcome: Progressing   

## 2019-10-17 ENCOUNTER — Inpatient Hospital Stay (HOSPITAL_COMMUNITY): Payer: Medicare Other

## 2019-10-17 ENCOUNTER — Inpatient Hospital Stay (HOSPITAL_COMMUNITY): Payer: Medicare Other | Admitting: Physical Therapy

## 2019-10-17 ENCOUNTER — Inpatient Hospital Stay (HOSPITAL_COMMUNITY): Payer: Medicare Other | Admitting: Occupational Therapy

## 2019-10-17 MED ORDER — GABAPENTIN 300 MG PO CAPS
300.0000 mg | ORAL_CAPSULE | Freq: Three times a day (TID) | ORAL | Status: DC
  Administered 2019-10-17 – 2019-10-19 (×6): 300 mg via ORAL
  Filled 2019-10-17 (×6): qty 1

## 2019-10-17 NOTE — Progress Notes (Signed)
Physical Therapy Session Note  Patient Details  Name: [name] Good MRN: [id number] Date of Birth: 1951-01-07  Today's Date: 10/17/2019 PT Individual Time: 1445-1530 PT Individual Time Calculation (min): 45 min   Short Term Goals: Week 2:  PT Short Term Goal 1 (Week 2): STG=LTG due to ELOS  Skilled Therapeutic Interventions/Progress Updates: Pt presented in w/c agreeable to therapy. Pt states some back pain but not rated. Pt transported to rehab gym for energy conservation. Trialed ascending/descending x 1 step with L rail only. Pt unable to generate enough power to push up x 1 step, however was able to go up x 2 steps with minA sidestepping with L rail. Pt required assist for placement of LLE when descending to provide adequate space for R foot when lowering. Pt then transported to parallel bars and pt performed step ups to 4in step with B rails 2 x 5 bilaterally for strengthening. PTA cued to decrease UE use and to "power though" with LE stepping onto step. Pt then transported to day room and performed ambulatory transfer to NuStep with pt participating L3 x 5 min for 4 extremities avg 50 SPM then x2 min L2 for BLE only. Pt then returned to w/c in same manner as prior and transported back to room. Pt remained in w/c at end of session with belt alarm on, call bell within reach and needs met.      Therapy Documentation Precautions:  Precautions Precautions: Fall Restrictions Weight Bearing Restrictions: No General:   Vital Signs: Therapy Vitals Temp: 98.3 F (36.8 C) Pulse Rate: (!) 103 Resp: 17 BP: 123/62 Patient Position (if appropriate): Lying Oxygen Therapy SpO2: 98 % O2 Device: Room Air Pain: Pain Assessment Pain Scale: Faces Pain Score: 0-No pain Faces Pain Scale: Hurts even more Pain Type: Acute pain Pain Location: Back Pain Orientation: Lower Multiple Pain Sites: No Mobility:   Locomotion :    Trunk/Postural Assessment :    Balance:   Exercises:   Other  Treatments:      Therapy/Group: Individual Therapy  Elfego Giammarino 10/17/2019, 4:13 PM

## 2019-10-17 NOTE — Progress Notes (Signed)
Speech Language Pathology Daily Session Note  Patient Details  Name: [name] Good MRN: [id number] Date of Birth: [birth year]  Today's Date: 10/17/2019 SLP Individual Time: 1605-1700 SLP Individual Time Calculation (min): 55 min  Short Term Goals: Week 2: SLP Short Term Goal 1 (Week 2): STG=LTG due to remaining LOS  Skilled Therapeutic Interventions: Skilled ST services focused on education and cognitive skills. SLP provided education, referrencing handouts in room pertaining to low sodium and heart healthy diet, per request by pt. SLP facilitated mildly complex and complex problem solving skills utilizing account balancing tasks, pt required initial mod A verbal cues for problem solving and error awareness fading to min A verbal cues when utilizing "thinking aloud" strategies to aid in organization,erro awareness and recall. SLP educated pt how to check nutrition labels, when ordering groceries online ( which pt does at baseline) and to meal plan according to restricted sodium diet. All questions were answered to satisfaction. Pt demonstrated delayed recall of key points from diet education with supervision A verbal cues. Pt was left in room with call bell within reach and chair alarm set. ST recommends to continue skilled ST services.      Pain Pain Assessment Pain Scale: Faces Pain Score: 0-No pain  Therapy/Group: Individual Therapy  [name] Good  [facility] 10/17/2019, 5:06 PM

## 2019-10-17 NOTE — Plan of Care (Signed)
  Problem: Consults Goal: RH STROKE PATIENT EDUCATION Description: See Patient Education module for education specifics  Outcome: Progressing Goal: Nutrition Consult-if indicated Description: Maintain weight during admission to rehab Outcome: Progressing

## 2019-10-17 NOTE — Progress Notes (Signed)
[place] PHYSICAL MEDICINE & REHABILITATION PROGRESS NOTE   Subjective/Complaints:  Pain at night and with standing bilateral feet , denies tingling   ROS: Patient denies CP, SOB, N/V/D    Objective:   No results found. No results for input(s): WBC, HGB, HCT, PLT in the last 72 hours. No results for input(s): NA, K, CL, CO2, GLUCOSE, BUN, CREATININE, CALCIUM in the last 72 hours.  Intake/Output Summary (Last 24 hours) at 10/17/2019 0847 Last data filed at 10/16/2019 1700 Gross per 24 hour  Intake 240 ml  Output --  Net 240 ml     Physical Exam: Vital Signs Blood pressure 121/71, pulse 81, temperature 98.4 F (36.9 C), resp. rate 17, SpO2 96 %.  General: No acute distress Mood and affect are appropriate Heart: Regular rate and rhythm no rubs murmurs or extra sounds Lungs: Clear to auscultation, breathing unlabored, no rales or wheezes Abdomen: Positive bowel sounds, soft nontender to palpation, nondistended Extremities: No clubbing, cyanosis, or edema Skin: No evidence of breakdown, no evidence of rash  Neurologic: Cranial nerves II through XII intact, motor strength is 4/5 in bilateral deltoid, bicep, tricep, grip, hip flexor, knee extensors, ankle dorsiflexor and plantar flexor  Musculoskeletal: Full range of motion in all 4 extremities. No joint swelling     Assessment/Plan: 1. Functional deficits secondary to Right thalamic infarct   which require 3+ hours per day of interdisciplinary therapy in a comprehensive inpatient rehab setting.  Physiatrist is providing close team supervision and 24 hour management of active medical problems listed below.  Physiatrist and rehab team continue to assess barriers to discharge/monitor patient progress toward functional and medical goals  Care Tool:  Bathing  Bathing activity did not occur: Refused Body parts bathed by patient: Right arm, Left arm, Chest, Abdomen, Right upper leg, Left upper leg, Face, Front perineal area,  Buttocks   Body parts bathed by helper: Right arm, Left arm     Bathing assist Assist Level: Minimal Assistance - Patient > 75%     Upper Body Dressing/Undressing Upper body dressing Upper body dressing/undressing activity did not occur (including orthotics): Refused What is the patient wearing?: Dress    Upper body assist Assist Level: Contact Guard/Touching assist    Lower Body Dressing/Undressing Lower body dressing    Lower body dressing activity did not occur: Refused What is the patient wearing?: Underwear/pull up     Lower body assist Assist for lower body dressing: Contact Guard/Touching assist     Toileting Toileting Toileting Activity did not occur Press photographer and hygiene only): Refused  Toileting assist Assist for toileting: Contact Guard/Touching assist     Transfers Chair/bed transfer  Transfers assist  Chair/bed transfer activity did not occur: Refused  Chair/bed transfer assist level: Contact Guard/Touching assist     Locomotion Ambulation   Ambulation assist   Ambulation activity did not occur: Safety/medical concerns (unable due to pain limiting WBing on feet)  Assist level: Contact Guard/Touching assist Assistive device: Walker-rolling Max distance: 73ft   Walk 10 feet activity   Assist  Walk 10 feet activity did not occur: Safety/medical concerns  Assist level: Contact Guard/Touching assist Assistive device: Walker-rolling   Walk 50 feet activity   Assist Walk 50 feet with 2 turns activity did not occur: Safety/medical concerns  Assist level: Minimal Assistance - Patient > 75%      Walk 150 feet activity   Assist Walk 150 feet activity did not occur: Safety/medical concerns  Walk 10 feet on uneven surface  activity   Assist Walk 10 feet on uneven surfaces activity did not occur: Safety/medical concerns         Wheelchair     Assist Will patient use wheelchair at discharge?:  (TBD) Type of  Wheelchair: Manual    Wheelchair assist level: Supervision/Verbal cueing Max wheelchair distance: 100 ft    Wheelchair 50 feet with 2 turns activity    Assist        Assist Level: Supervision/Verbal cueing   Wheelchair 150 feet activity     Assist          Blood pressure 121/71, pulse 81, temperature 98.4 F (36.9 C), resp. rate 17, SpO2 96 %.  Medical Problem List and Plan: 1.  Left side weakness secondary to right thalamic infarction             -patient may  shower             -ELOS/Goals6/25, will need Sup - SW to f/u   Continue CIR PT, OT, SLP ongoing 2.  Antithrombotics: -DVT/anticoagulation: Eliquis             -antiplatelet therapy: N/A 3. Pain Management: Oxycodone and Robaxin as needed- has been c/o her feet hurting intermittently- right foot pain has hammer toes, neuropathy and PAD will order plastizote shoes. Well controlled.  4. Mood: Melatonin 3 mg nightly ineffective for sleep  -add trazodone scheduled 50mg  which may help sleep and mood  -increase gabapentin as below  -xanax prn for anxiety, pt states she feels drowsy day after taking this med request D/C  Feels more settled now that D/C paln has been established   -             -antipsychotic agents: N/A 5. Neuropsych: This patient is capable of making decisions on her own behalf. 6. Skin/Wound Care: Routine skin checks 7. Fluids/Electrolytes/Nutrition: Routine in and outs with follow-up chemistries 8.  LV apical thrombus.  Contiune Eliquis as directed.  Follow-up outpatient Dr. Doylene Canard cardiology services plan repeat echocardiogram 3 months 9.  Hyperlipidemia.  Lipitor 10.  Restless leg syndrome.  Requip as needed- has significant Sx's- increased to 3mg  TID prn. Advised her to ask for medication 30 minutes prior to when she usually takes it. Better controlled with this increase 11.  History of lumbar decompression complicated by wound dehiscence 2017.  Patient used assistive device prior to  admission. 12. Hypokalemia- improved to 4.1- was 2.3 on admission with significant dehydration that has resolved.   13. LE discomfort but neurovascular exam is normal 14.  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  Constipation resolved after SSE, cont  senna no sign of obstruction   LOS: 12 days A FACE TO FACE EVALUATION WAS PERFORMED  Charlett Blake 10/17/2019, 8:47 AM

## 2019-10-17 NOTE — Progress Notes (Signed)
Occupational Therapy Session Note  Patient Details  Name: [name] Good MRN: [id number] Date of Birth: 05-Aug-1950  Today's Date: 10/17/2019 OT Individual Time: 5750-5183 OT Individual Time Calculation (min): 60 min    Short Term Goals: Week 1:  OT Short Term Goal 1 (Week 1): Pt will perform LB dressing with mod A overall. OT Short Term Goal 1 - Progress (Week 1): Met OT Short Term Goal 2 (Week 1): Pt will perform toileting with mod A overall. OT Short Term Goal 2 - Progress (Week 1): Met OT Short Term Goal 3 (Week 1): Pt will perform toilet transfer with mod A overall. OT Short Term Goal 3 - Progress (Week 1): Met OT Short Term Goal 4 (Week 1): Pt will perform UB dressing with min A overall. OT Short Term Goal 4 - Progress (Week 1): Met Week 2:  OT Short Term Goal 1 (Week 2): STGs=LTGs due to ELOS  Skilled Therapeutic Interventions/Progress Updates:    Patient seated edge of bed, pleasant and cooperative, she notes pain in lower back at times t/o session.  Completed dressing tasks seated edge of bed.  Able to donn pants with CS - CGA for CM in stance.  c-style shirt with CS/set up.  Max A to donn darco shoes.  Sit to stand and short distance ambulation with RW to w/c with CGA.  Completed oral care seated in w/c independently.   Completed UB, LB light stretching and trunk mobility activities both seated and in stance with focus on posture, balance and mobility.  She requires CGA in stance.  Completed practice with both R & L feet onto 6" step.  Provided posture support with towel rolls for sitting in w/c with relief, provided ice packs for lower back pain.  She remained seated in w/c at close of session with seat belt alarm set and call bell in reach.    Therapy Documentation Precautions:  Precautions Precautions: Fall Restrictions Weight Bearing Restrictions: No   Therapy/Group: Individual Therapy  Carlos Levering 10/17/2019, 7:34 AM

## 2019-10-17 NOTE — Progress Notes (Signed)
Occupational Therapy Session Note  Patient Details  Name: [name] Good MRN: [id number] Date of Birth: 1950-05-17  Today's Date: 10/17/2019 OT Individual Time: 1400-1435 OT Individual Time Calculation (min): 35 min    Short Term Goals: Week 2:  OT Short Term Goal 1 (Week 2): STGs=LTGs due to ELOS  Skilled Therapeutic Interventions/Progress Updates:    Pt completed donning her shoes from the EOB with min assist.  She was unable to fasten them, so therapist had to assist as this could not be performed with the reacher.  She then ambulated out in the hallway with use of the RW for support and min guard assist for approximately 30'.  She was taken the rest of the way to the day room with therapist pushing her in the wheelchair.  Had her work on standing balance, functional mobility, and use of the reacher while playing [employer].  She was able to stand and toss the bean bags with supervision.  She then used the RW and reacher to gather them back up off of the floor and the board.  Min instructional cueing to get closer to the bean bags to before trying to remove the reacher from the reacher bag and walk with it in her hand.  She was able to complete 2 trials of this before transferring back to the wheelchair and returning to the room via therapist assist.  Had her stay up in the wheelchair and work on filling out her memory notebook to complete the session.  Call button and phone in reach.  Therapy Documentation Precautions:  Precautions Precautions: Fall Restrictions Weight Bearing Restrictions: No  Pain: Pain Assessment Pain Scale: Faces Pain Score: 0-No pain Faces Pain Scale: Hurts even more Pain Type: Acute pain Pain Location: Back Pain Orientation: Lower Multiple Pain Sites: No ADL: See Care Tool Section for some details of mobility and selfcare  Therapy/Group: Individual Therapy  Lakya Schrupp OTR/L 10/17/2019, 4:01 PM

## 2019-10-18 ENCOUNTER — Inpatient Hospital Stay (HOSPITAL_COMMUNITY): Payer: Medicare Other | Admitting: Occupational Therapy

## 2019-10-18 ENCOUNTER — Inpatient Hospital Stay (HOSPITAL_COMMUNITY): Payer: Medicare Other | Admitting: Physical Therapy

## 2019-10-18 ENCOUNTER — Inpatient Hospital Stay (HOSPITAL_COMMUNITY): Payer: Medicare Other

## 2019-10-18 LAB — CBC
HCT: 38.1 % (ref 36.0–46.0)
Hemoglobin: 12.3 g/dL (ref 12.0–15.0)
MCH: 31.1 pg (ref 26.0–34.0)
MCHC: 32.3 g/dL (ref 30.0–36.0)
MCV: 96.5 fL (ref 80.0–100.0)
Platelets: 329 10*3/uL (ref 150–400)
RBC: 3.95 MIL/uL (ref 3.87–5.11)
RDW: 12.1 % (ref 11.5–15.5)
WBC: 6.6 10*3/uL (ref 4.0–10.5)
nRBC: 0 % (ref 0.0–0.2)

## 2019-10-18 LAB — BASIC METABOLIC PANEL
Anion gap: 9 (ref 5–15)
BUN: 13 mg/dL (ref 8–23)
CO2: 25 mmol/L (ref 22–32)
Calcium: 9.1 mg/dL (ref 8.9–10.3)
Chloride: 105 mmol/L (ref 98–111)
Creatinine, Ser: 0.75 mg/dL (ref 0.44–1.00)
GFR calc Af Amer: >60 mL/min (ref >60)
GFR calc non Af Amer: >60 mL/min (ref >60)
Glucose, Bld: 123 mg/dL — ABNORMAL HIGH (ref 70–99)
Potassium: 3.8 mmol/L (ref 3.5–5.1)
Sodium: 139 mmol/L (ref 135–145)

## 2019-10-18 NOTE — Progress Notes (Signed)
Physical Therapy Session Note  Patient Details  Name: [name] Good MRN: [id number] Date of Birth: Dec 09, 1950  Today's Date: 10/18/2019 PT Individual Time: 1400-1433 PT Individual Time Calculation (min): 33 min   Short Term Goals: Week 2:  PT Short Term Goal 1 (Week 2): STG=LTG due to ELOS  Skilled Therapeutic Interventions/Progress Updates: Pt presented in w/c with dgt present agreeable to therapy. Session focused on family ed with particular focus on stair training, transfers, car transfer, and tub transfer. Pt transported to rehab gym for time management and energy conservation. PTA demonstrated to dgt how pt is to perform side stepping ascend/descending stairs with L rail. Pt then demonstrated with PTA due to pt's fatigue. Pt was able to ascend/descend x 4 steps with L rail with CGA and increased time. Pt then transported to ADL apt and pt was able to perform ambulatory transfer with supervision for STS and CGA for gait into bathroom with transfer to TTB. Pt was then able to demonstrate management with BLE to enter/exit tub safely. Pt then ambulated back to w/c in same manner. PTA provided edu to dgt on energy conservation and providing supervision upon d/c with pt and dgt verbalizing understanding. Due to limited time PTA provided verbal education on car transfer explaining to have pt ambulate to car and turn to sit in car seat THEN bringing legs into vehicle. Pt's dgt will bring sedan height vehicle instead of SUV to facilitate transfer. Pt returned to room at end of session and remained in w/c with dgt present and needs met.      Therapy Documentation Precautions:  Precautions Precautions: Fall Restrictions Weight Bearing Restrictions: No General:   Vital Signs: Therapy Vitals Temp: 99.5 F (37.5 C) Pulse Rate: 99 Resp: 18 BP: 119/60 Patient Position (if appropriate): Lying Oxygen Therapy SpO2: 100 % O2 Device: Room Air   Therapy/Group: Individual Therapy  Annette Bertelson   [name] Good, PTA  10/18/2019, 5:25 PM

## 2019-10-18 NOTE — Progress Notes (Signed)
Occupational Therapy Session Note  Patient Details  Name: [name] Good MRN: [id number] Date of Birth: Oct 21, 1950  Today's Date: 10/18/2019 OT Individual Time: 1115-1200 OT Individual Time Calculation (min): 45 min    Short Term Goals: Week 2:  OT Short Term Goal 1 (Week 2): STGs=LTGs due to ELOS  Skilled Therapeutic Interventions/Progress Updates:    Upon entering the room, pt seated on EOB with phone and reports she is "making a grocery list for home". Pt is agreeable to OT intervention. Pt ambulates  from bed >wheelchair with close supervision for short distance. OT discussed the importance of wearing shoes secondary to neuropathy with mobility.  Pt seated in wheelchair at sink for grooming tasks independently. Pt washing UB with set up A and donning clean shirt per request with set up A as well. Pt reports back pain and heat applied during this session. Pt obtaining memory notebook and writes therapy session times and what activities she performed in morning PT session and this session into memory book. Pt remained in wheelchair at end of session with call bell and all needed items within reach.   Therapy Documentation Precautions:  Precautions Precautions: Fall Restrictions Weight Bearing Restrictions: No   Pain: Pain Assessment Pain Scale: 0-10 Pain Score: 6  Pain Type: Chronic pain Pain Location: Back Pain Descriptors / Indicators: Other (Comment);Aching;Tightness (stiff) Patients Stated Pain Goal: 2 Pain Intervention(s): Ambulation/increased activity   Therapy/Group: Individual Therapy  Alen Bleacher 10/18/2019, 12:08 PM

## 2019-10-18 NOTE — Plan of Care (Signed)
°  Problem: Consults Goal: RH STROKE PATIENT EDUCATION Description: See Patient Education module for education specifics  Outcome: Progressing   Problem: RH SKIN INTEGRITY Goal: RH STG SKIN FREE OF INFECTION/BREAKDOWN Description: Remain free from skin breakdown during rehab stay with mod I assist Outcome: Progressing Goal: RH STG MAINTAIN SKIN INTEGRITY WITH ASSISTANCE Description: STG Maintain Skin Integrity With mod I Assistance. Outcome: Progressing   Problem: RH SAFETY Goal: RH STG ADHERE TO SAFETY PRECAUTIONS W/ASSISTANCE/DEVICE Description: STG Adhere to Safety Precautions With Assistance/Device wheelchair with mod I assist Outcome: Progressing

## 2019-10-18 NOTE — Progress Notes (Signed)
Speech Language Pathology Daily Session Note  Patient Details  Name: LACHELE LIEVANOS MRN: [id number] Date of Birth: [birth year]  Today's Date: 10/18/2019 SLP Individual Time: 1307-1400 SLP Individual Time Calculation (min): 53 min  Short Term Goals: Week 2: SLP Short Term Goal 1 (Week 2): STG=LTG due to remaining LOS  Skilled Therapeutic Interventions: Skilled ST services focused on education and cognitive skills. Pt's daughter Aviela was present, SLP provided education pertaining to short term memory, complex problem solving, error awareness in complex task and anticipatory awareness. All questions answered to satisfaction. SLP facilitated complex problem solving and error awareness in checkbook balancing task, pt required supervision A verbal cues. Pt demonstrated anticipatory awareness listing items to have near while seated and adjusting layout within home to avoid obstacles with RW.Marland Kitchen Pt was left in room with call bell within reach and chair alarm set. ST recommends to continue skilled ST services.      Pain Pain Assessment Pain Score: 0-No pain  Therapy/Group: Individual Therapy  Bladen Umar  [facility] 10/18/2019, 5:43 PM

## 2019-10-18 NOTE — Progress Notes (Signed)
[place] PHYSICAL MEDICINE & REHABILITATION PROGRESS NOTE   Subjective/Complaints:  No issues overnite , slept well , bowel and bladder ok  ROS: Patient denies CP, SOB, N/V/D    Objective:   No results found. Recent Labs    10/18/19 0545  WBC 6.6  HGB 12.3  HCT 38.1  PLT 329   Recent Labs    10/18/19 0545  NA 139  K 3.8  CL 105  CO2 25  GLUCOSE 123*  BUN 13  CREATININE 0.75  CALCIUM 9.1    Intake/Output Summary (Last 24 hours) at 10/18/2019 0834 Last data filed at 10/18/2019 0300 Gross per 24 hour  Intake --  Output 600 ml  Net -600 ml     Physical Exam: Vital Signs Blood pressure (!) 101/58, pulse 88, temperature 98.7 F (37.1 C), temperature source Oral, resp. rate 18, SpO2 96 %.   General: No acute distress Mood and affect are appropriate Heart: Regular rate and rhythm no rubs murmurs or extra sounds Lungs: Clear to auscultation, breathing unlabored, no rales or wheezes Abdomen: Positive bowel sounds, soft nontender to palpation, nondistended Extremities: No clubbing, cyanosis, or edema Skin: No evidence of breakdown, no evidence of rash  Neurologic: Cranial nerves II through XII intact, motor strength is 4/5 in bilateral deltoid, bicep, tricep, grip, hip flexor, knee extensors, ankle dorsiflexor and plantar flexor  Musculoskeletal: Full range of motion in all 4 extremities. No joint swelling  Assessment/Plan: 1. Functional deficits secondary to Right thalamic infarct   which require 3+ hours per day of interdisciplinary therapy in a comprehensive inpatient rehab setting.  Physiatrist is providing close team supervision and 24 hour management of active medical problems listed below.  Physiatrist and rehab team continue to assess barriers to discharge/monitor patient progress toward functional and medical goals  Care Tool:  Bathing  Bathing activity did not occur: Refused Body parts bathed by patient: Right arm, Left arm, Chest, Abdomen, Right  upper leg, Left upper leg, Face, Front perineal area, Buttocks   Body parts bathed by helper: Right arm, Left arm     Bathing assist Assist Level: Minimal Assistance - Patient > 75%     Upper Body Dressing/Undressing Upper body dressing Upper body dressing/undressing activity did not occur (including orthotics): Refused What is the patient wearing?: Dress    Upper body assist Assist Level: Contact Guard/Touching assist    Lower Body Dressing/Undressing Lower body dressing    Lower body dressing activity did not occur: Refused What is the patient wearing?: Underwear/pull up     Lower body assist Assist for lower body dressing: Contact Guard/Touching assist     Toileting Toileting Toileting Activity did not occur Landscape architect and hygiene only): Refused  Toileting assist Assist for toileting: Contact Guard/Touching assist     Transfers Chair/bed transfer  Transfers assist  Chair/bed transfer activity did not occur: Refused  Chair/bed transfer assist level: Contact Guard/Touching assist     Locomotion Ambulation   Ambulation assist   Ambulation activity did not occur: Safety/medical concerns (unable due to pain limiting WBing on feet)  Assist level: Contact Guard/Touching assist Assistive device: Walker-rolling Max distance: 80f   Walk 10 feet activity   Assist  Walk 10 feet activity did not occur: Safety/medical concerns  Assist level: Contact Guard/Touching assist Assistive device: Walker-rolling   Walk 50 feet activity   Assist Walk 50 feet with 2 turns activity did not occur: Safety/medical concerns  Assist level: Minimal Assistance - Patient > 75%  Walk 150 feet activity   Assist Walk 150 feet activity did not occur: Safety/medical concerns         Walk 10 feet on uneven surface  activity   Assist Walk 10 feet on uneven surfaces activity did not occur: Safety/medical concerns         Wheelchair     Assist Will  patient use wheelchair at discharge?:  (TBD) Type of Wheelchair: Manual    Wheelchair assist level: Supervision/Verbal cueing Max wheelchair distance: 100 ft    Wheelchair 50 feet with 2 turns activity    Assist        Assist Level: Supervision/Verbal cueing   Wheelchair 150 feet activity     Assist          Blood pressure (!) 101/58, pulse 88, temperature 98.7 F (37.1 C), temperature source Oral, resp. rate 18, SpO2 96 %.  Medical Problem List and Plan: 1.  Left side weakness secondary to right thalamic infarction             -patient may  shower. Team conference today please see physician documentation under team conference tab, met with team  to discuss problems,progress, and goals. Formulized individual treatment plan based on medical history, underlying problem and comorbidities.              -ELOS/Goals6/25, will need Sup - SW to f/u - PT not sure if she has transportation for OP therapy also concerned that OP therapy has a high copay   Continue CIR PT, OT, SLP ongoing 2.  Antithrombotics: -DVT/anticoagulation: Eliquis             -antiplatelet therapy: N/A 3. Pain Management: Oxycodone and Robaxin as needed- has been c/o her feet hurting intermittently- right foot pain has hammer toes, neuropathy and PAD will order plastizote shoes. Well controlled.  4. Mood: Melatonin 3 mg nightly ineffective for sleep  -add trazodone scheduled 38m which may help sleep and mood  -increase gabapentin as below  -xanax prn for anxiety, pt states she feels drowsy day after taking this med request D/C  Feels more settled now that D/C paln has been established                -antipsychotic agents: N/A 5. Neuropsych: This patient is capable of making decisions on her own behalf. 6. Skin/Wound Care: Routine skin checks 7. Fluids/Electrolytes/Nutrition: Routine in and outs with follow-up chemistries 8.  LV apical thrombus.  Contiune Eliquis as directed.  Follow-up outpatient Dr.  [facility] Canardcardiology services plan repeat echocardiogram 3 months 9.  Hyperlipidemia.  Lipitor 10.  Restless leg syndrome.  Requip as needed- has significant Sx's- increased to 349mTID prn. Advised her to ask for medication 30 minutes prior to when she usually takes it. Better controlled with this increase 11.  History of lumbar decompression complicated by wound dehiscence 2017.  Patient used assistive device prior to admission. 12. Hypokalemia- improved to 4.1- was 2.3 on admission with significant dehydration that has resolved.   13. LE discomfort but neurovascular exam is normal 14.  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  Constipation resolved after SSE, cont  senna no sign of obstruction   LOS: 13 days A FACE TO FACE EVALUATION WAS PERFORMED  AnCharlett Blake/23/2021, 8:34 AM

## 2019-10-18 NOTE — Patient Care Conference (Signed)
Inpatient RehabilitationTeam Conference and Plan of Care Update Date: 10/18/2019   Time: 2:02 PM    Patient Name: [name] Good      Medical Record Number: [id number]  Date of Birth: 28-Jun-1950 Sex: Female         Room/Bed: 4W09C/4W09C-01 Payor Info: Payor: Advertising copywriter MEDICARE / Plan: [facility] MEDICARE / Product Type: *No Product type* /    Admit Date/Time:  10/05/2019  2:41 PM  Primary Diagnosis:  Right thalamic infarction [facility])  Patient Active Problem List   Diagnosis Date Noted  . Right thalamic infarction (HCC) 10/05/2019  . Severe dehydration 10/02/2019  . Grief reaction 10/02/2019  . RLS (restless legs syndrome) 10/02/2019  . Acute CVA (cerebrovascular accident) (HCC) 10/01/2019  . Hypokalemia 10/01/2019  . Cellulitis of left upper extremity 10/18/2017  . Arthritis 10/18/2017  . Septic shock (HCC) 10/16/2017  . Former smoker 11/13/2016  . History of restless legs syndrome 11/13/2016  . Nontraumatic incomplete tear of right rotator cuff 08/18/2016  . Autoimmune disease (HCC) 08/12/2016  . Primary osteoarthritis of both knees 08/12/2016  . Primary osteoarthritis of both feet 08/12/2016  . Obesity (BMI 35.0-39.9 without comorbidity) 08/12/2016  . Osteoarthritis of right hip 10/25/2015  . 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 S/P lumbar laminectomy 06/14/2015    Expected Discharge Date: Expected Discharge Date: 10/21/19  Team Members Present: Physician leading conference: Dr. Claudette Laws Care Coodinator Present: Chana Bode, RN, BSN, CRRN;Christina Vita Barley, BSW Nurse Present: Doran Durand, LPN PT Present: Grier Rocher, PT OT Present: Jackquline Denmark, OT SLP Present: Colin Benton, SLP PPS Coordinator present : Fae Pippin, SLP     Current Status/Progress Goal Weekly Team Focus  Bowel/Bladder   Pt remains continent of bowel and bladder. LBM 10/15/19  Pt to remain continent of bowel and bladder. No c\o of constipation  Assess  bowel/bladder QS/PRN   Swallow/Nutrition/ Hydration             ADL's   Min A bathing, CGA UB dressing, CGA LB dressing, CGA toileting and toilet/TTB transfers using RW at ambulatory level  Supervision-CGA overall  NMR, sit<stands, functional transfers, ADL retraining, family education   Mobility   CGA transfers, CGA gait up to 42ft, minA stairs with L rail only sidestepping, improved standing tolerance and decreased B foot pain  supervision bed mobility and transfers, with contact guard (touching assistance) for ambulation  balance, ambulation, stair training, family ed   Communication             Safety/Cognition/ Behavioral Observations  Supervison A  Supervision A - Mod I  education, mildly comples, alternating attention, anticipatory awareness and memory with aids   Pain   Pt's pain level managed by PRN oxycodone and scheduled Gabapentin  Pt's pain level <3  Assess pain level Qshift and PRN   Skin   Pt's skin is warm, dry, intact  Pt's skin remains intact.  Assess skin QS/PRN    Rehab Goals Patient on target to meet rehab goals: Yes Rehab Goals Revised: Patient on target with current goals *See Care Plan and progress notes for long and short-term goals.     Barriers to Discharge  Current Status/Progress Possible Resolutions Date Resolved   Nursing                  PT                    OT  SLP                Care Coordinator Home environment access/layout;Lack of/limited family support Patient will be home with spouse. Daughter routine checks (lives across street) on target          Discharge Planning/Teaching Needs:  Patient has decided to discharge back home vs. with daughter ( SW will speak with DTR after conference on Wed)  Will schedule with spouse   Team Discussion:  Good progress noted this week. Continue to report pain in feet and requires CGA for LB toileting/dressing. Family education set for 10/18/19 with discharge 10/21/19.  Revisions to  Treatment Plan:  Address steps with one railing for discharge.     Medical Summary Current Status: plans to go to own home post D/C , Insomnia improved Weekly Focus/Goal: pain improved in feet but still present does better with sandals  Barriers to Discharge: Decreased family/caregiver support   Possible Resolutions to Barriers: plan D/C in 2 d after family training   Continued Need for Acute Rehabilitation Level of Care: The patient requires daily medical management by a physician with specialized training in physical medicine and rehabilitation for the following reasons: Direction of a multidisciplinary physical rehabilitation program to maximize functional independence : Yes Medical management of patient stability for increased activity during participation in an intensive rehabilitation regime.: Yes Analysis of laboratory values and/or radiology reports with any subsequent need for medication adjustment and/or medical intervention. : Yes   I attest that I was present, lead the team conference, and concur with the assessment and plan of the team.   Dorien Chihuahua B 10/18/2019, 2:02 PM

## 2019-10-18 NOTE — Progress Notes (Signed)
Physical Therapy Session Note  Patient Details  Name: [name] Good MRN: [id number] Date of Birth: 08/17/1950  Today's Date: 10/18/2019 PT Individual Time: 0800-0858 PT Individual Time Calculation (min): 58 min   Short Term Goals: Week 2:  PT Short Term Goal 1 (Week 2): STG=LTG due to ELOS  Skilled Therapeutic Interventions/Progress Updates:   Pt received sitting EOBand agreeable to PT. PT assisted pt to don shoes sitting EOB for time management. Pt performed stand pivot transfer to WC wit RW and supervision assist.   Gait training with RW over level surface with RW 2 x 30ft with supervision assist min cues for posture and step length on the R side due to antalgia on the L.  5xSTS with supervision assist and min cues for full erect posture into standing.; no LOB noted. .  Stair management training to ascend/descend 4 steps with 1 rails on the L with min assist and moderate cues for UE placement and step to gait pattern. No LOB noted and only requiring min assist from PT for safety  Dynamic gait training with RW to weave through 10cones with supervision assist. Cues from PT for posture and symmetry in step length to prevent later LOB   Dynamic balance to engage in Fine motor task of wii bowling while standing on level surface. Pt required supervision -CGA from PT for safety when standing with only 1 UE supported on RW; able to tolerate 6 frames in standing prior to requiring rest break.    Patient returned to room and left sitting EOB with call bell in reach and all needs met.            Therapy Documentation Precautions:  Precautions Precautions: Fall Restrictions Weight Bearing Restrictions: No   Pain: Pain Assessment Pain Scale: 0-10 Pain Score: 6  Pain Type: Chronic pain Pain Location: Back Pain Descriptors / Indicators: Other (Comment);Aching;Tightness (stiff) Patients Stated Pain Goal: 2 Pain Intervention(s): Ambulation/increased activity    Therapy/Group:  Individual Therapy   E  10/18/2019, 9:18 AM  

## 2019-10-18 NOTE — Progress Notes (Signed)
Patient ID: [name] Good, female   DOB: Feb 08, 1951, 69 y.o.   MRN: [id number]  Team Conference Report to Patient/Family  Team Conference discussion was reviewed with the patient, including goals, any changes in plan of care and target discharge date.  Patient and caregiver express understanding and are in agreement.  The patient has a target discharge date of 10/21/19.  Andria Rhein 10/18/2019, 1:46 PM

## 2019-10-19 ENCOUNTER — Inpatient Hospital Stay (HOSPITAL_COMMUNITY): Payer: Medicare Other | Admitting: Occupational Therapy

## 2019-10-19 ENCOUNTER — Inpatient Hospital Stay (HOSPITAL_COMMUNITY): Payer: Medicare Other

## 2019-10-19 ENCOUNTER — Inpatient Hospital Stay (HOSPITAL_COMMUNITY): Payer: Medicare Other | Admitting: Physical Therapy

## 2019-10-19 MED ORDER — APIXABAN 5 MG PO TABS: 5.0000 mg | ORAL_TABLET | Freq: Two times a day (BID) | ORAL | 1 refills | Status: DC

## 2019-10-19 MED ORDER — MELATONIN 3 MG PO TABS: 3.0000 mg | ORAL_TABLET | Freq: Every day | ORAL | 0 refills | Status: DC

## 2019-10-19 MED ORDER — MAGNESIUM OXIDE 400 MG PO TABS: 400.0000 mg | ORAL_TABLET | Freq: Every day | ORAL | 0 refills | Status: DC

## 2019-10-19 MED ORDER — ACETAMINOPHEN 325 MG PO TABS: 650.0000 mg | ORAL_TABLET | ORAL | Status: DC | PRN

## 2019-10-19 MED ORDER — ATORVASTATIN CALCIUM 80 MG PO TABS: 80.0000 mg | ORAL_TABLET | Freq: Every day | ORAL | 0 refills | Status: DC

## 2019-10-19 MED ORDER — OXYCODONE-ACETAMINOPHEN 5-325 MG PO TABS: 1.0000 | ORAL_TABLET | Freq: Four times a day (QID) | ORAL | 0 refills | Status: DC | PRN

## 2019-10-19 MED ORDER — GABAPENTIN 400 MG PO CAPS: 400.0000 mg | ORAL_CAPSULE | Freq: Three times a day (TID) | ORAL | 1 refills | Status: DC

## 2019-10-19 MED ORDER — GABAPENTIN 400 MG PO CAPS
400.0000 mg | ORAL_CAPSULE | Freq: Three times a day (TID) | ORAL | Status: DC
  Administered 2019-10-19 – 2019-10-21 (×6): 400 mg via ORAL
  Filled 2019-10-19 (×6): qty 1

## 2019-10-19 MED ORDER — ROPINIROLE HCL 3 MG PO TABS: 3.0000 mg | ORAL_TABLET | Freq: Every evening | ORAL | 0 refills | Status: DC | PRN

## 2019-10-19 NOTE — Plan of Care (Signed)
°  Problem: Consults Goal: RH STROKE PATIENT EDUCATION Description: See Patient Education module for education specifics  Outcome: Progressing Goal: Nutrition Consult-if indicated Description: Maintain weight during admission to rehab Outcome: Progressing   Problem: RH SKIN INTEGRITY Goal: RH STG SKIN FREE OF INFECTION/BREAKDOWN Description: Remain free from skin breakdown during rehab stay with mod I assist Outcome: Progressing Goal: RH STG MAINTAIN SKIN INTEGRITY WITH ASSISTANCE Description: STG Maintain Skin Integrity With mod I Assistance. Outcome: Progressing   Problem: RH PAIN MANAGEMENT Goal: RH STG PAIN MANAGED AT OR BELOW PT'S PAIN GOAL Description: Pain goal of 4 on pain scale of 0-10 Outcome: Progressing

## 2019-10-19 NOTE — Progress Notes (Signed)
[place] PHYSICAL MEDICINE & REHABILITATION PROGRESS NOTE   Subjective/Complaints:  Bilateral lower ext pain in feet no new trauma some burning sensation in feet   Also discussed some symptoms of reduced vision to the Left side , no eye d/c no redness, no trauma, no double vision   ROS: Patient denies CP, SOB, N/V/D    Objective:   No results found. Recent Labs    10/18/19 0545  WBC 6.6  HGB 12.3  HCT 38.1  PLT 329   Recent Labs    10/18/19 0545  NA 139  K 3.8  CL 105  CO2 25  GLUCOSE 123*  BUN 13  CREATININE 0.75  CALCIUM 9.1    Intake/Output Summary (Last 24 hours) at 10/19/2019 0731 Last data filed at 10/18/2019 1000 Gross per 24 hour  Intake 218 ml  Output 2 ml  Net 216 ml     Physical Exam: Vital Signs Blood pressure 114/64, pulse 85, temperature 98.1 F (36.7 C), temperature source Oral, resp. rate 16, SpO2 95 %. Eyes without d/c no scleral edema , non injected, EOMI, visual fields intact  General: No acute distress Mood and affect are appropriate Heart: Regular rate and rhythm no rubs murmurs or extra sounds Lungs: Clear to auscultation, breathing unlabored, no rales or wheezes Abdomen: Positive bowel sounds, soft nontender to palpation, nondistended Extremities: No clubbing, cyanosis, or edema Skin: No evidence of breakdown, no evidence of rash  Neurologic: Cranial nerves II through XII intact, motor strength is 4/5 in bilateral deltoid, bicep, tricep, grip, hip flexor, knee extensors, ankle dorsiflexor and plantar flexor  Musculoskeletal: Full range of motion in all 4 extremities. No joint swelling  Assessment/Plan: 1. Functional deficits secondary to Right thalamic infarct   which require 3+ hours per day of interdisciplinary therapy in a comprehensive inpatient rehab setting.  Physiatrist is providing close team supervision and 24 hour management of active medical problems listed below.  Physiatrist and rehab team continue to assess  barriers to discharge/monitor patient progress toward functional and medical goals  Care Tool:  Bathing  Bathing activity did not occur: Refused Body parts bathed by patient: Left arm, Chest, Right arm, Abdomen   Body parts bathed by helper: Right arm, Left arm     Bathing assist Assist Level: Set up assist     Upper Body Dressing/Undressing Upper body dressing Upper body dressing/undressing activity did not occur (including orthotics): Refused What is the patient wearing?: Bra, Pull over shirt    Upper body assist Assist Level: Minimal Assistance - Patient > 75%    Lower Body Dressing/Undressing Lower body dressing    Lower body dressing activity did not occur: Refused What is the patient wearing?: Underwear/pull up     Lower body assist Assist for lower body dressing: Contact Guard/Touching assist     Toileting Toileting Toileting Activity did not occur Press photographer and hygiene only): Refused  Toileting assist Assist for toileting: Contact Guard/Touching assist     Transfers Chair/bed transfer  Transfers assist  Chair/bed transfer activity did not occur: Refused  Chair/bed transfer assist level: Supervision/Verbal cueing     Locomotion Ambulation   Ambulation assist   Ambulation activity did not occur: Safety/medical concerns (unable due to pain limiting WBing on feet)  Assist level: Supervision/Verbal cueing Assistive device: Walker-rolling Max distance: 50   Walk 10 feet activity   Assist  Walk 10 feet activity did not occur: Safety/medical concerns  Assist level: Supervision/Verbal cueing Assistive device: Walker-rolling   Walk 50  feet activity   Assist Walk 50 feet with 2 turns activity did not occur: Safety/medical concerns  Assist level: Contact Guard/Touching assist      Walk 150 feet activity   Assist Walk 150 feet activity did not occur: Safety/medical concerns         Walk 10 feet on uneven surface   activity   Assist Walk 10 feet on uneven surfaces activity did not occur: Safety/medical concerns         Wheelchair     Assist Will patient use wheelchair at discharge?:  (TBD) Type of Wheelchair: Manual    Wheelchair assist level: Supervision/Verbal cueing Max wheelchair distance: 100 ft    Wheelchair 50 feet with 2 turns activity    Assist        Assist Level: Supervision/Verbal cueing   Wheelchair 150 feet activity     Assist          Blood pressure 114/64, pulse 85, temperature 98.1 F (36.7 C), temperature source Oral, resp. rate 16, SpO2 95 %.  Medical Problem List and Plan: 1.  Left side weakness secondary to right thalamic infarction             -patient may  shower.              -ELOS/Goals6/25, will need Sup - SW to f/u - PT not sure if she has transportation for OP therapy also concerned that OP therapy has a high copay   Continue CIR PT, OT, SLP ongoing 2.  Antithrombotics: -DVT/anticoagulation: Eliquis             -antiplatelet therapy: N/A 3. Pain Management: Oxycodone and Robaxin as needed- has been c/o her feet hurting intermittently- right foot pain has hammer toes, neuropathy and PAD will order plastizote shoes. Well controlled.  4. Mood: Melatonin 3 mg nightly ineffective for sleep  -add trazodone scheduled 50mg  which may help sleep and mood  -increase gabapentin 400mg  TID  -xanax prn for anxiety, pt states she feels drowsy day after taking this med request D/C  Feels more settled now that D/C paln has been established                -antipsychotic agents: N/A 5. Neuropsych: This patient is capable of making decisions on her own behalf. 6. Skin/Wound Care: Routine skin checks 7. Fluids/Electrolytes/Nutrition: Routine in and outs with follow-up chemistries 8.  LV apical thrombus.  Contiune Eliquis as directed.  Follow-up outpatient Dr. Doylene Canard cardiology services plan repeat echocardiogram 3 months 9.  Hyperlipidemia.   Lipitor 10.  Restless leg syndrome.  Requip as needed- has significant Sx's- increased to 3mg  TID prn. Advised her to ask for medication 30 minutes prior to when she usually takes it. Better controlled with this increase 11.  History of lumbar decompression complicated by wound dehiscence 2017.  Patient used assistive device prior to admission. 12. Hypokalemia- improved to 4.1- was 2.3 on admission with significant dehydration that has resolved.   13. LE discomfort but neurovascular exam is normal 14.  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  Constipation resolved after SSE, cont  senna no sign of obstruction  16.  Visual disturbance post CVA - exam neg, reviewed MRI brain, appears most c/w prior R P-O infarct, may f/u optho as OP to check [facility]   LOS: 14 days A FACE TO [place] E Ivis Henneman 10/19/2019, 7:31 AM

## 2019-10-19 NOTE — Progress Notes (Signed)
Speech Language Pathology Daily Session Note  Patient Details  Name: [name] Good MRN: [id number] Date of Birth: 1951-03-23  Today's Date: 10/19/2019 SLP Individual Time: 9937-1696 SLP Individual Time Calculation (min): 43 min  Short Term Goals: Week 2: SLP Short Term Goal 1 (Week 2): STG=LTG due to remaining LOS  Skilled Therapeutic Interventions: Skilled ST services focused on cognitive skills. SLP facilitated reassessment of cognitive linguistic skills utilizing MOCA version 7.2, pt scored 29 out 30 (n=>26). Pt demonstrated great improvement in problem solving, shorter term recall and calculations compared to upon evaluation scoring 17 out 30 on MOCA version 7.1.  SLP also facilitated subsection of higher level cognitive assessment Cognistat, pt scored WFL on short term recall and calculations and moderate impairment on construction task. Pt was left in room with call bell within reach and chair alarm set. ST recommends to continue skilled ST services.      Pain Pain Assessment Pain Score: 0-No pain  Therapy/Group: Individual Therapy  Any Mcneice  [facility] 10/19/2019, 3:28 PM

## 2019-10-19 NOTE — Discharge Summary (Signed)
Physician Discharge Summary  Patient ID: [name] Good MRN: [id number] DOB/AGE: 70-Dec-1952 70 y.o.  Admit date: 10/05/2019 Discharge date: 10/21/2019  Discharge Diagnoses:  Principal Problem:   Right thalamic infarction [facility]) DVT prophylaxis Pain management Mood stabilization Left apical thrombus Hyperlipidemia Restless leg syndrome History of lumbar decompression complicated by wound dehiscence 2017 Constipation  Discharged Condition: Stable  Significant Diagnostic Studies: CT Code Stroke CTA Head W/WO contrast  Result Date: 10/01/2019 CLINICAL DATA:  Initial evaluation for acute stroke, left-sided weakness, altered mental status. EXAM: CT ANGIOGRAPHY HEAD AND NECK CT PERFUSION BRAIN TECHNIQUE: Multidetector CT imaging of the head and neck was performed using the standard protocol during bolus administration of intravenous contrast. Multiplanar CT image reconstructions and MIPs were obtained to evaluate the vascular anatomy. Carotid stenosis measurements (when applicable) are obtained utilizing NASCET criteria, using the distal internal carotid diameter as the denominator. Multiphase CT imaging of the brain was performed following IV bolus contrast injection. Subsequent parametric perfusion maps were calculated using RAPID software. CONTRAST:  OMNIPAQUE IOHEXOL 350 MG/ML SOLN COMPARISON:  Comparison made with prior noncontrast head CT from earlier same day. FINDINGS: CTA NECK FINDINGS Aortic arch: Visualized aortic arch of normal caliber with normal branch pattern. Moderate atheromatous plaque seen about the arch and origin of the great vessels without hemodynamically significant stenosis. Note made of soft plaque protruding into the lumen of the distal arch/proximal intrathoracic aorta (series 5, image 307). Visualized subclavian arteries widely patent. Right carotid system: Right common carotid artery patent from its origin to the bifurcation without flow-limiting stenosis. Scattered  eccentric plaque about the right bifurcation without significant stenosis. Just distally within the proximal right ICA, additional focal plaque is seen with associated short-segment stenosis of up to 50% by NASCET criteria (series 5, image 169). Right ICA tortuous and medialized into the retropharyngeal space. Right ICA otherwise widely patent distally to the skull base without stenosis, dissection or occlusion. Left carotid system: Left common carotid artery patent from its origin to the bifurcation without stenosis. Scattered eccentric mixed plaque about the left bifurcation/proximal left ICA without hemodynamically significant stenosis. Proximal left ICA tortuous and medialized into the retropharyngeal space. Left ICA widely patent distally to the skull base without stenosis, dissection or occlusion. Vertebral arteries: Both vertebral arteries arise from the subclavian arteries. Strongly dominant right vertebral artery with a markedly hypoplastic left vertebral artery noted. Dominant right vertebral artery patent within the neck without stenosis, dissection, or occlusion. Markedly diminutive left vertebral artery patent as well without appreciable stenosis or other vascular abnormality. Skeleton: Alignment: Straightening of the normal cervical lordosis. No listhesis or malalignment. Skull base and vertebrae: Skull base intact. Normal C1-2 articulations are preserved in the dens is intact. Vertebral body heights maintained. No acute fracture. Soft tissues and spinal canal: Soft tissues of the neck demonstrate no acute finding. No abnormal prevertebral edema. Spinal canal within normal limits. Disc levels: Mild multilevel cervical spondylosis without significant stenosis, most pronounced at C6-7. Upper chest: Visualized upper chest demonstrates no acute finding. Partially visualized lung apices are clear. Other: None. Other neck: No other acute soft tissue abnormality within the neck. No mass lesion or adenopathy.  Subcentimeter calcification noted within the left lobe of thyroid. Upper chest: Visualized upper chest demonstrates no acute finding. Partially visualized lungs are grossly clear. Review of the MIP images confirms the above findings CTA HEAD FINDINGS Anterior circulation: Petrous segments widely patent bilaterally. Scattered atheromatous plaque throughout the carotid siphons with relatively mild multifocal stenosis. A1 segments patent bilaterally.  Normal anterior communicating artery complex. Anterior cerebral arteries widely patent proximally. Short-segment severe distal right ACA stenosis noted (series 5, image 62). ACAs otherwise widely patent to their distal aspects. No M1 stenosis or occlusion. Normal MCA bifurcations. Distal MCA branches well perfused and symmetric. Posterior circulation: Dominant right vertebral artery patent to the vertebrobasilar junction without stenosis. Right PICA not visualized. Hypoplastic left vertebral artery patent to the vertebrobasilar junction as well. Left PICA patent. Basilar patent to its distal aspect without stenosis. Superior cerebral arteries patent bilaterally. Left PCA supplied via the basilar. Right PCA supplied via a hypoplastic right P1 segment and robust right posterior communicating artery. Both PCAs well perfused to their distal aspects without stenosis. Venous sinuses: Patent. Anatomic variants: Predominant fetal type origin of the right PCA. No intracranial aneurysm. Review of the MIP images confirms the above findings CT Brain Perfusion Findings: ASPECTS: 10 CBF (<30%) Volume: 0mL Perfusion (Tmax>6.0s) volume: 0mL Mismatch Volume: 0mL Infarction Location:Negative CT perfusion for acute core infarct or other perfusion deficit. IMPRESSION: CTA HEAD AND NECK IMPRESSION: 1. Negative CTA for emergent large vessel occlusion. 2. 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 3. Single  short-segment severe distal right ACA stenosis as above. 4. Additional moderate atheromatous disease about the major arterial vasculature of the head and neck as above. No other hemodynamically significant or correctable stenosis. CT PERFUSION IMPRESSION: Negative CT perfusion. No evidence for acute core infarct or other perfusion deficit. CT CERVICAL SPINE IMPRESSION: No acute traumatic injury within the cervical spine. These results were communicated to Dr. Wilford Corner at approximately 9:00 pmon 6/6/2021by text page via the [facility] messaging system. Electronically Signed   By: Rise Mu M.D.   On: 10/01/2019 21:50   CT Code Stroke CTA Neck W/WO contrast  Result Date: 10/01/2019 CLINICAL DATA:  Initial evaluation for acute stroke, left-sided weakness, altered mental status. EXAM: CT ANGIOGRAPHY HEAD AND NECK CT PERFUSION BRAIN TECHNIQUE: Multidetector CT imaging of the head and neck was performed using the standard protocol during bolus administration of intravenous contrast. Multiplanar CT image reconstructions and MIPs were obtained to evaluate the vascular anatomy. Carotid stenosis measurements (when applicable) are obtained utilizing NASCET criteria, using the distal internal carotid diameter as the denominator. Multiphase CT imaging of the brain was performed following IV bolus contrast injection. Subsequent parametric perfusion maps were calculated using RAPID software. CONTRAST:  OMNIPAQUE IOHEXOL 350 MG/ML SOLN COMPARISON:  Comparison made with prior noncontrast head CT from earlier same day. FINDINGS: CTA NECK FINDINGS Aortic arch: Visualized aortic arch of normal caliber with normal branch pattern. Moderate atheromatous plaque seen about the arch and origin of the great vessels without hemodynamically significant stenosis. Note made of soft plaque protruding into the lumen of the distal arch/proximal intrathoracic aorta (series 5, image 307). Visualized subclavian arteries widely patent. Right  carotid system: Right common carotid artery patent from its origin to the bifurcation without flow-limiting stenosis. Scattered eccentric plaque about the right bifurcation without significant stenosis. Just distally within the proximal right ICA, additional focal plaque is seen with associated short-segment stenosis of up to 50% by NASCET criteria (series 5, image 169). Right ICA tortuous and medialized into the retropharyngeal space. Right ICA otherwise widely patent distally to the skull base without stenosis, dissection or occlusion. Left carotid system: Left common carotid artery patent from its origin to the bifurcation without stenosis. Scattered eccentric mixed plaque about the left bifurcation/proximal left ICA without hemodynamically significant stenosis.  Proximal left ICA tortuous and medialized into the retropharyngeal space. Left ICA widely patent distally to the skull base without stenosis, dissection or occlusion. Vertebral arteries: Both vertebral arteries arise from the subclavian arteries. Strongly dominant right vertebral artery with a markedly hypoplastic left vertebral artery noted. Dominant right vertebral artery patent within the neck without stenosis, dissection, or occlusion. Markedly diminutive left vertebral artery patent as well without appreciable stenosis or other vascular abnormality. Skeleton: Alignment: Straightening of the normal cervical lordosis. No listhesis or malalignment. Skull base and vertebrae: Skull base intact. Normal C1-2 articulations are preserved in the dens is intact. Vertebral body heights maintained. No acute fracture. Soft tissues and spinal canal: Soft tissues of the neck demonstrate no acute finding. No abnormal prevertebral edema. Spinal canal within normal limits. Disc levels: Mild multilevel cervical spondylosis without significant stenosis, most pronounced at C6-7. Upper chest: Visualized upper chest demonstrates no acute finding. Partially visualized lung  apices are clear. Other: None. Other neck: No other acute soft tissue abnormality within the neck. No mass lesion or adenopathy. Subcentimeter calcification noted within the left lobe of thyroid. Upper chest: Visualized upper chest demonstrates no acute finding. Partially visualized lungs are grossly clear. Review of the MIP images confirms the above findings CTA HEAD FINDINGS Anterior circulation: Petrous segments widely patent bilaterally. Scattered atheromatous plaque throughout the carotid siphons with relatively mild multifocal stenosis. A1 segments patent bilaterally. Normal anterior communicating artery complex. Anterior cerebral arteries widely patent proximally. Short-segment severe distal right ACA stenosis noted (series 5, image 62). ACAs otherwise widely patent to their distal aspects. No M1 stenosis or occlusion. Normal MCA bifurcations. Distal MCA branches well perfused and symmetric. Posterior circulation: Dominant right vertebral artery patent to the vertebrobasilar junction without stenosis. Right PICA not visualized. Hypoplastic left vertebral artery patent to the vertebrobasilar junction as well. Left PICA patent. Basilar patent to its distal aspect without stenosis. Superior cerebral arteries patent bilaterally. Left PCA supplied via the basilar. Right PCA supplied via a hypoplastic right P1 segment and robust right posterior communicating artery. Both PCAs well perfused to their distal aspects without stenosis. Venous sinuses: Patent. Anatomic variants: Predominant fetal type origin of the right PCA. No intracranial aneurysm. Review of the MIP images confirms the above findings CT Brain Perfusion Findings: ASPECTS: 10 CBF (<30%) Volume: 0mL Perfusion (Tmax>6.0s) volume: 0mL Mismatch Volume: 0mL Infarction Location:Negative CT perfusion for acute core infarct or other perfusion deficit. IMPRESSION: CTA HEAD AND NECK IMPRESSION: 1. Negative CTA for emergent large vessel occlusion. 2. Atheromatous  plaque about the proximal right ICA with associated short-segment stenosis of up to 50% by NASCET criteria. 3. Single short-segment severe distal right ACA stenosis as above. 4. Additional moderate atheromatous disease about the major arterial vasculature of the head and neck as above. No other hemodynamically significant or correctable stenosis. CT PERFUSION IMPRESSION: Negative CT perfusion. No evidence for acute core infarct or other perfusion deficit. CT CERVICAL SPINE IMPRESSION: No acute traumatic injury within the cervical spine. These results were communicated to Dr. Wilford Corner at approximately 9:00 pmon 6/6/2021by text page via the [facility] messaging system. Electronically Signed   By: Rise Mu M.D.   On: 10/01/2019 21:50   MR BRAIN WO CONTRAST  Result Date: 10/02/2019 CLINICAL DATA:  Follow-up examination for acute stroke. EXAM: MRI HEAD WITHOUT CONTRAST TECHNIQUE: Multiplanar, multiecho pulse sequences of the brain and surrounding structures were obtained without intravenous contrast. COMPARISON:  Comparison made with prior CTs from 10/01/2019. FINDINGS: Brain: Diffuse prominence of the CSF containing spaces  compatible with generalized age-related cerebral atrophy. Patchy and confluent T2/FLAIR hyperintensity within the periventricular deep white matter both cerebral hemispheres most consistent with chronic small vessel ischemic disease. Patchy involvement of the deep gray nuclei and pons. Overall, appearance is moderate to advanced in nature. Superimposed remote cortical infarct at the right temporal occipital region. 13 mm focus of diffusion abnormality at the dorsal right thalamus compatible with an acute ischemic infarct (series 5, image 76). Associated signal loss on corresponding ADC map (series 6, image 25). No associated hemorrhage or mass effect. No other evidence for acute or subacute ischemia. Gray-white matter differentiation otherwise maintained. No other areas of remote cortical  infarction. No evidence for acute intracranial hemorrhage. No mass lesion, midline shift or mass effect. No hydrocephalus or extra-axial fluid collection. No made of a partially empty sella. Midline structures intact. Vascular: Major intracranial vascular flow voids are maintained. Skull and upper cervical spine: Craniocervical junction within normal limits. Bone marrow signal intensity normal. No scalp soft tissue abnormality. Sinuses/Orbits: Patient status post bilateral ocular lens replacement. Globes and orbital soft tissues demonstrate no acute finding. Scattered mucosal thickening noted throughout the paranasal sinuses with superimposed left maxillary sinus retention cyst. Trace left mastoid effusion noted, of doubtful significance. Inner ear structures grossly normal. Other: None. IMPRESSION: 1. 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 2. 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 3. Underlying age-related cerebral atrophy with moderate to advanced chronic microvascular ischemic disease. Electronically Signed   By: Rise Mu M.D.   On: 10/02/2019 02:39   CT C-SPINE NO CHARGE  Result Date: 10/01/2019 CLINICAL DATA:  Initial evaluation for acute stroke, left-sided weakness, altered mental status. EXAM: CT ANGIOGRAPHY HEAD AND NECK CT PERFUSION BRAIN TECHNIQUE: Multidetector CT imaging of the head and neck was performed using the standard protocol during bolus administration of intravenous contrast. Multiplanar CT image reconstructions and MIPs were obtained to evaluate the vascular anatomy. Carotid stenosis measurements (when applicable) are obtained utilizing NASCET criteria, using the distal internal carotid diameter as the denominator. Multiphase CT imaging of the brain was performed following IV bolus contrast injection. Subsequent parametric perfusion maps were calculated using RAPID software. CONTRAST:  OMNIPAQUE IOHEXOL 350 MG/ML SOLN  COMPARISON:  Comparison made with prior noncontrast head CT from earlier same day. FINDINGS: CTA NECK FINDINGS Aortic arch: Visualized aortic arch of normal caliber with normal branch pattern. Moderate atheromatous plaque seen about the arch and origin of the great vessels without hemodynamically significant stenosis. Note made of soft plaque protruding into the lumen of the distal arch/proximal intrathoracic aorta (series 5, image 307). Visualized subclavian arteries widely patent. Right carotid system: Right common carotid artery patent from its origin to the bifurcation without flow-limiting stenosis. Scattered eccentric plaque about the right bifurcation without significant stenosis. Just distally within the proximal right ICA, additional focal plaque is seen with associated short-segment stenosis of up to 50% by NASCET criteria (series 5, image 169). Right ICA tortuous and medialized into the retropharyngeal space. Right ICA otherwise widely patent distally to the skull base without stenosis, dissection or occlusion. Left carotid system: Left common carotid artery patent from its origin to the bifurcation without stenosis. Scattered eccentric mixed plaque about the left bifurcation/proximal left ICA without hemodynamically significant stenosis. Proximal left ICA tortuous and medialized into the retropharyngeal space. Left ICA widely patent distally to the skull base without stenosis, dissection or occlusion. Vertebral arteries: Both vertebral arteries arise from the subclavian arteries. Strongly dominant right vertebral artery with  a markedly hypoplastic left vertebral artery noted. Dominant right vertebral artery patent within the neck without stenosis, dissection, or occlusion. Markedly diminutive left vertebral artery patent as well without appreciable stenosis or other vascular abnormality. Skeleton: Alignment: Straightening of the normal cervical lordosis. No listhesis or malalignment. Skull base and  vertebrae: Skull base intact. Normal C1-2 articulations are preserved in the dens is intact. Vertebral body heights maintained. No acute fracture. Soft tissues and spinal canal: Soft tissues of the neck demonstrate no acute finding. No abnormal prevertebral edema. Spinal canal within normal limits. Disc levels: Mild multilevel cervical spondylosis without significant stenosis, most pronounced at C6-7. Upper chest: Visualized upper chest demonstrates no acute finding. Partially visualized lung apices are clear. Other: None. Other neck: No other acute soft tissue abnormality within the neck. No mass lesion or adenopathy. Subcentimeter calcification noted within the left lobe of thyroid. Upper chest: Visualized upper chest demonstrates no acute finding. Partially visualized lungs are grossly clear. Review of the MIP images confirms the above findings CTA HEAD FINDINGS Anterior circulation: Petrous segments widely patent bilaterally. Scattered atheromatous plaque throughout the carotid siphons with relatively mild multifocal stenosis. A1 segments patent bilaterally. Normal anterior communicating artery complex. Anterior cerebral arteries widely patent proximally. Short-segment severe distal right ACA stenosis noted (series 5, image 62). ACAs otherwise widely patent to their distal aspects. No M1 stenosis or occlusion. Normal MCA bifurcations. Distal MCA branches well perfused and symmetric. Posterior circulation: Dominant right vertebral artery patent to the vertebrobasilar junction without stenosis. Right PICA not visualized. Hypoplastic left vertebral artery patent to the vertebrobasilar junction as well. Left PICA patent. Basilar patent to its distal aspect without stenosis. Superior cerebral arteries patent bilaterally. Left PCA supplied via the basilar. Right PCA supplied via a hypoplastic right P1 segment and robust right posterior communicating artery. Both PCAs well perfused to their distal aspects without  stenosis. Venous sinuses: Patent. Anatomic variants: Predominant fetal type origin of the right PCA. No intracranial aneurysm. Review of the MIP images confirms the above findings CT Brain Perfusion Findings: ASPECTS: 10 CBF (<30%) Volume: 0mL Perfusion (Tmax>6.0s) volume: 0mL Mismatch Volume: 0mL Infarction Location:Negative CT perfusion for acute core infarct or other perfusion deficit. IMPRESSION: CTA HEAD AND NECK IMPRESSION: 1. Negative CTA for emergent large vessel occlusion. 2. 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 3. Single short-segment severe distal right ACA stenosis as above. 4. Additional moderate atheromatous disease about the major arterial vasculature of the head and neck as above. No other hemodynamically significant or correctable stenosis. CT PERFUSION IMPRESSION: Negative CT perfusion. No evidence for acute core infarct or other perfusion deficit. CT CERVICAL SPINE IMPRESSION: No acute traumatic injury within the cervical spine. These results were communicated to Dr. Wilford CornerArora at approximately 9:00 pmon 6/6/2021by text page via the [facility] messaging system. Electronically Signed   By: Rise MuBenjamin  McClintock M.D.   On: 10/01/2019 21:50   CT Code Stroke Cerebral Perfusion with contrast  Result Date: 10/01/2019 CLINICAL DATA:  Initial evaluation for acute stroke, left-sided weakness, altered mental status. EXAM: CT ANGIOGRAPHY HEAD AND NECK CT PERFUSION BRAIN TECHNIQUE: Multidetector CT imaging of the head and neck was performed using the standard protocol during bolus administration of intravenous contrast. Multiplanar CT image reconstructions and MIPs were obtained to evaluate the vascular anatomy. Carotid stenosis measurements (when applicable) are obtained utilizing NASCET criteria, using the distal internal carotid diameter as the denominator. Multiphase CT imaging of the brain was performed following  IV bolus  contrast injection. Subsequent parametric perfusion maps were calculated using RAPID software. CONTRAST:  OMNIPAQUE IOHEXOL 350 MG/ML SOLN COMPARISON:  Comparison made with prior noncontrast head CT from earlier same day. FINDINGS: CTA NECK FINDINGS Aortic arch: Visualized aortic arch of normal caliber with normal branch pattern. Moderate atheromatous plaque seen about the arch and origin of the great vessels without hemodynamically significant stenosis. Note made of soft plaque protruding into the lumen of the distal arch/proximal intrathoracic aorta (series 5, image 307). Visualized subclavian arteries widely patent. Right carotid system: Right common carotid artery patent from its origin to the bifurcation without flow-limiting stenosis. Scattered eccentric plaque about the right bifurcation without significant stenosis. Just distally within the proximal right ICA, additional focal plaque is seen with associated short-segment stenosis of up to 50% by NASCET criteria (series 5, image 169). Right ICA tortuous and medialized into the retropharyngeal space. Right ICA otherwise widely patent distally to the skull base without stenosis, dissection or occlusion. Left carotid system: Left common carotid artery patent from its origin to the bifurcation without stenosis. Scattered eccentric mixed plaque about the left bifurcation/proximal left ICA without hemodynamically significant stenosis. Proximal left ICA tortuous and medialized into the retropharyngeal space. Left ICA widely patent distally to the skull base without stenosis, dissection or occlusion. Vertebral arteries: Both vertebral arteries arise from the subclavian arteries. Strongly dominant right vertebral artery with a markedly hypoplastic left vertebral artery noted. Dominant right vertebral artery patent within the neck without stenosis, dissection, or occlusion. Markedly diminutive left vertebral artery patent as well without appreciable stenosis or  other vascular abnormality. Skeleton: Alignment: Straightening of the normal cervical lordosis. No listhesis or malalignment. Skull base and vertebrae: Skull base intact. Normal C1-2 articulations are preserved in the dens is intact. Vertebral body heights maintained. No acute fracture. Soft tissues and spinal canal: Soft tissues of the neck demonstrate no acute finding. No abnormal prevertebral edema. Spinal canal within normal limits. Disc levels: Mild multilevel cervical spondylosis without significant stenosis, most pronounced at C6-7. Upper chest: Visualized upper chest demonstrates no acute finding. Partially visualized lung apices are clear. Other: None. Other neck: No other acute soft tissue abnormality within the neck. No mass lesion or adenopathy. Subcentimeter calcification noted within the left lobe of thyroid. Upper chest: Visualized upper chest demonstrates no acute finding. Partially visualized lungs are grossly clear. Review of the MIP images confirms the above findings CTA HEAD FINDINGS Anterior circulation: Petrous segments widely patent bilaterally. Scattered atheromatous plaque throughout the carotid siphons with relatively mild multifocal stenosis. A1 segments patent bilaterally. Normal anterior communicating artery complex. Anterior cerebral arteries widely patent proximally. Short-segment severe distal right ACA stenosis noted (series 5, image 62). ACAs otherwise widely patent to their distal aspects. No M1 stenosis or occlusion. Normal MCA bifurcations. Distal MCA branches well perfused and symmetric. Posterior circulation: Dominant right vertebral artery patent to the vertebrobasilar junction without stenosis. Right PICA not visualized. Hypoplastic left vertebral artery patent to the vertebrobasilar junction as well. Left PICA patent. Basilar patent to its distal aspect without stenosis. Superior cerebral arteries patent bilaterally. Left PCA supplied via the basilar. Right PCA supplied via  a hypoplastic right P1 segment and robust right posterior communicating artery. Both PCAs well perfused to their distal aspects without stenosis. Venous sinuses: Patent. Anatomic variants: Predominant fetal type origin of the right PCA. No intracranial aneurysm. Review of the MIP images confirms the above findings CT Brain Perfusion Findings: ASPECTS: 10 CBF (<30%) Volume: 48mL Perfusion (Tmax>6.0s) volume: 49mL  Mismatch Volume: 0mL Infarction Location:Negative CT perfusion for acute core infarct or other perfusion deficit. IMPRESSION: CTA HEAD AND NECK IMPRESSION: 1. Negative CTA for emergent large vessel occlusion. 2. 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 3. Single short-segment severe distal right ACA stenosis as above. 4. Additional moderate atheromatous disease about the major arterial vasculature of the head and neck as above. No other hemodynamically significant or correctable stenosis. CT PERFUSION IMPRESSION: Negative CT perfusion. No evidence for acute core infarct or other perfusion deficit. CT CERVICAL SPINE IMPRESSION: No acute traumatic injury within the cervical spine. These results were communicated to Dr. Wilford Corner at approximately 9:00 pmon 6/6/2021by text page via the [facility] messaging system. Electronically Signed   By: Rise Mu M.D.   On: 10/01/2019 21:50   ECHOCARDIOGRAM COMPLETE  Result Date: 10/02/2019    ECHOCARDIOGRAM REPORT   Patient Name:   JASE HIMMELBERGER Date of Exam: 10/02/2019 Medical Rec #:  [id number]      Height:       61.0 in Accession #:    [id number]     Weight:       233.2 lb Date of Birth:  26-Jul-1950      BSA:          2.016 m Patient Age:    69 years       BP:           123/72 mmHg Patient Gender: F              HR:           90 bpm. Exam Location:  Inpatient Procedure: 2D Echo, Color Doppler, Cardiac Doppler and Intracardiac            Opacification Agent Indications:    Stroke i163.9   History:        Patient has no prior history of Echocardiogram examinations.  Sonographer:    Irving Burton Senior RDCS Referring Phys: [id number] VASUNDHRA RATHORE IMPRESSIONS  1. There appears to be an oblong 0.5 x 0.75 cm mobile filling defect in the LV apex, which is severely hypokinetic/aneurysmal, consistent with thrombus.. Left ventricular ejection fraction, by estimation, is 60 to 65%. The left ventricle has normal function. The left ventricle demonstrates regional wall motion abnormalities (see scoring diagram/findings for description). Left ventricular diastolic parameters are consistent with Grade I diastolic dysfunction (impaired relaxation). There is severe akinesis of the left ventricular, apical apical segment.  2. Right ventricular systolic function is normal. The right ventricular size is normal. There is normal pulmonary artery systolic pressure. The estimated right ventricular systolic pressure is 28.8 mmHg.  3. The mitral valve is grossly normal. Trivial mitral valve regurgitation.  4. The aortic valve is tricuspid. Aortic valve regurgitation is not visualized.  5. The inferior vena cava is normal in size with greater than 50% respiratory variability, suggesting right atrial pressure of 3 mmHg. Conclusion(s)/Recommendation(s): LV Apical thrombus is noted, anticoagulation is recommended per current guidelines. FINDINGS  Left Ventricle: There appears to be an oblong 0.5 x 0.75 cm mobile filling defect in the LV apex, which is severely hypokinetic/aneurysmal, consistent with thrombus. Left ventricular ejection fraction, by estimation, is 60 to 65%. The left ventricle has  normal function. The left ventricle demonstrates regional wall motion abnormalities. Severe akinesis of the left ventricular, apical apical segment. Definity contrast agent was given IV to delineate the left ventricular endocardial borders. The left ventricular internal cavity size was normal  in size. There is no left ventricular hypertrophy.  Left ventricular diastolic parameters are consistent with Grade I diastolic dysfunction (impaired relaxation). Indeterminate filling pressures. Right Ventricle: The right ventricular size is normal. No increase in right ventricular wall thickness. Right ventricular systolic function is normal. There is normal pulmonary artery systolic pressure. The tricuspid regurgitant velocity is 2.54 m/s, and  with an assumed right atrial pressure of 3 mmHg, the estimated right ventricular systolic pressure is 70.6 mmHg. Left Atrium: Left atrial size was normal in size. Right Atrium: Right atrial size was normal in size. Pericardium: There is no evidence of pericardial effusion. Mitral Valve: The mitral valve is grossly normal. Trivial mitral valve regurgitation. Tricuspid Valve: The tricuspid valve is grossly normal. Tricuspid valve regurgitation is trivial. Aortic Valve: The aortic valve is tricuspid. Aortic valve regurgitation is not visualized. Pulmonic Valve: The pulmonic valve was normal in structure. Pulmonic valve regurgitation is not visualized. Aorta: The aortic root and ascending aorta are structurally normal, with no evidence of dilitation. Venous: The inferior vena cava is normal in size with greater than 50% respiratory variability, suggesting right atrial pressure of 3 mmHg. IAS/Shunts: No atrial level shunt detected by color flow Doppler.  LEFT VENTRICLE PLAX 2D LVIDd:         4.00 cm  Diastology LVIDs:         2.50 cm  LV e' lateral:   6.64 cm/s LV PW:         1.00 cm  LV E/e' lateral: 10.6 LV IVS:        1.00 cm  LV e' medial:    5.66 cm/s LVOT diam:     2.00 cm  LV E/e' medial:  12.4 LV SV:         63 LV SV Index:   31 LVOT Area:     3.14 cm  RIGHT VENTRICLE RV S prime:     15.40 cm/s TAPSE (M-mode): 2.0 cm LEFT ATRIUM             Index       RIGHT ATRIUM           Index LA diam:        3.60 cm 1.79 cm/m  RA Area:     12.30 cm LA Vol (A2C):   46.3 ml 22.96 ml/m RA Volume:   28.50 ml  14.13 ml/m LA Vol  (A4C):   42.3 ml 20.98 ml/m LA Biplane Vol: 44.1 ml 21.87 ml/m  AORTIC VALVE LVOT Vmax:   109.00 cm/s LVOT Vmean:  74.000 cm/s LVOT VTI:    0.201 m  AORTA Ao Root diam: 3.00 cm Ao Asc diam:  3.30 cm MITRAL VALVE                TRICUSPID VALVE MV Area (PHT): 3.63 cm     TR Peak grad:   25.8 mmHg MV Decel Time: 209 msec     TR Vmax:        254.00 cm/s MV E velocity: 70.30 cm/s MV A velocity: 112.00 cm/s  SHUNTS MV E/A ratio:  0.63         Systemic VTI:  0.20 m                             Systemic Diam: 2.00 cm Lyman Bishop MD Electronically signed by Lyman Bishop MD Signature Date/Time: 10/02/2019/3:48:08 PM    Final    CT HEAD CODE  STROKE WO CONTRAST  Result Date: 10/01/2019 CLINICAL DATA:  Code stroke. Initial evaluation for acute ataxia, left-sided weakness, altered mental status. EXAM: CT HEAD WITHOUT CONTRAST TECHNIQUE: Contiguous axial images were obtained from the base of the skull through the vertex without intravenous contrast. COMPARISON:  None available. FINDINGS: Brain: Generalized age-related cerebral atrophy. Patchy and confluent hypodensity throughout the periventricular deep white matter both cerebral hemispheres, most consistent with chronic small vessel ischemic disease, advanced in nature. Chronic small microvascular ischemic changes noted involving the deep gray nuclei and pons as well. Focal encephalomalacia within the right parieto-occipital region compatible with a chronic ischemic infarct. No acute intracranial hemorrhage. No acute large vessel territory infarct. No mass lesion, midline shift or mass effect. No hydrocephalus or extra-axial fluid collection. Vascular: No hyperdense vessel. Scattered vascular calcifications noted within the carotid siphons. Skull: Scalp soft tissues demonstrate no acute finding. Calvarium intact. Sinuses/Orbits: Globes and orbital soft tissues within normal limits. Left maxillary sinus retention cyst noted. Scattered mucosal thickening noted throughout the  sphenoid ethmoidal sinuses, with superimposed air-fluid level within the right sphenoid sinus. Trace left mastoid effusion. Other: None. ASPECTS [facility] Stroke Program Early CT Score) - Ganglionic level infarction (caudate, lentiform nuclei, internal capsule, insula, M1-M3 cortex): 7 - Supraganglionic infarction (M4-M6 cortex): 3 Total score (0-10 with 10 being normal): 10 IMPRESSION: 1. No acute intracranial infarct or other abnormality. 2. ASPECTS is 10. 3. Chronic right parieto-occipital infarct. 4. Underlying age-related cerebral atrophy with advanced chronic microvascular ischemic disease. Critical Value/emergent results were called by telephone at the time of interpretation on 10/01/2019 at 7:57 pm to provider Dr. Wilford Corner, Who verbally acknowledged these results. Electronically Signed   By: Rise Mu M.D.   On: 10/01/2019 20:10    Labs:  Basic Metabolic Panel: Recent Labs  Lab 10/18/19 0545  NA 139  K 3.8  CL 105  CO2 25  GLUCOSE 123*  BUN 13  CREATININE 0.75  CALCIUM 9.1    CBC: Recent Labs  Lab 10/18/19 0545  WBC 6.6  HGB 12.3  HCT 38.1  MCV 96.5  PLT 329    CBG: No results for input(s): GLUCAP in the last 168 hours.  Family history.  Positive history for hypertension as well as hyperlipidemia.  Denies any colon cancer esophageal cancer or rectal cancer  Brief HPI:   [name] Good is a 69 y.o. right-handed female with history of depression, CAD, lumbar spine fusion complicated by wound dehiscence 2017, restless leg syndrome, quit smoking 6 years ago.  Per chart review lives with spouse independent with assistive device.  Mobile home 3 steps to entry.  Presented 10/01/2019 with left-sided weakness and slurred speech.  Cranial CT scan negative for acute changes.  Chronic right parieto-occipital infarction.  CT angiogram of head and neck negative for emergent large vessel occlusion.  MRI of the brain showed a 13 mm acute ischemic nonhemorrhagic right thalamic infarction.   Echocardiogram with ejection fraction 65% grade 1 diastolic dysfunction as well as LV thrombus.  Admission chemistries with potassium 2.3 creatinine 1.08.  Initially maintained on aspirin Plavix for CVA prophylaxis changed to Eliquis after findings of LV thrombus.  Plans to follow-up cardiology services Dr. Algie Coffer in regards to LV apical thrombus with repeat echocardiogram in 3 months.  Tolerating a regular diet.  Therapy evaluations completed and patient was admitted for a comprehensive rehab program.   Hospital Course: REETA KUK was admitted to rehab 10/05/2019 for inpatient therapies to consist of PT, ST and OT at  least three hours five days a week. Past admission physiatrist, therapy team and rehab RN have worked together to provide customized collaborative inpatient rehab.  Pertaining to patient's right thalamic infarction remained stable she would follow neurology services maintained on Eliquis both for CVA prophylaxis as well as findings of LV apical thrombus which would be addressed on an outpatient basis with cardiology service Dr. Algie Coffer and repeat echocardiogram in 3 months.  Pain management use of Neurontin for neuropathic pain titrated as needed, Percocet as needed.  She was maintained on Requip for restless leg syndrome.  Lipitor ongoing for hyperlipidemia.  She did have a history of lumbar decompression complicated by wound dehiscence 2017 no complaints of increasing back pain.  Bouts of constipation resolved with laxative assistance..  Patient did have some mild hypokalemia initially supplemented latest potassium 3.8.  She could follow-up outpatient.   Blood pressures were monitored on TID basis and controlled     Rehab course: During patient's stay in rehab weekly team conferences were held to monitor patient's progress, set goals and discuss barriers to discharge. At admission, patient required moderate assist sit to stand moderate assist stand pivot transfers.  Moderate assist  upper body dressing total assist lower body dressing moderate assist toilet transfers  Physical exam.  Blood pressure 119/66 pulse 87 temperature 98.3 respirations 16 oxygen saturation 99% room air Constitutional.  Alert no distress pleasantly confused about higher level issues.  She did exhibit some left side neglect HEENT Head.  Normocephalic and atraumatic Eyes.  Pupils round and reactive to light no discharge.nystagmus Neck.  Supple nontender no JVD without thyromegaly Cardiac regular rate rhythm without any extra sounds or murmur heard Abdomen.  Soft nontender positive bowel sounds without rebound Musculoskeletal Comments.  Right upper extremity 5/5 in all muscles tested Left upper extremity 4 -/5 in same muscles no change Right lower extremity 5/5 in hip flexors knee extension knee flexion dorsi flexion plantar flexion  /She  has had improvement in activity tolerance, balance, postural control as well as ability to compensate for deficits. Francis Dowse has had improvement in functional use RUE/LUE  and RLE/LLE as well as improvement in awareness.  Sessions focused on family education with particular focus on stair training transfers car transfers and tub transfers.  Demonstrated to daughter how to perform sidestepping a sending descending stairs.  Patient then demonstrated with PTA due to bouts of fatigue.  Patient was able to go up and down stairs with left rail contact-guard assist and increased time.  She was able to perform amatory transfers with supervision for STS and contact-guard for gait into the bathroom and transfer to TTB.  Patient was then able to demonstrate management with bilateral lower extremities to enter and exit tub safely.  Patient ambulates from bed to wheelchair with close supervision for short distance.  Patient washed upper body with set up assist and donning clean sure per request with set up assist as well.  Speech therapy provided education pertaining to short-term memory  complex problem-solving air awareness and complex task and anticipatory awareness.  It was advised the need for supervision for safety.  Family teaching completed plan discharged home       Disposition: Discharge to home    Diet: Regular  Special Instructions: No driving smoking or alcohol  Medications at discharge 1.  Tylenol as needed 2.  Eliquis 5 mg p.o. twice daily 3.  Lipitor 80 mg p.o. daily 4.  Neurontin 400 mg p.o. 3 times daily 5.  Melatonin 3  mg nightly 6.  Multivitamin daily 7.  Oxycodone 1 tablet every 6 hours as needed moderate pain  8.  Requip 3 mg p.o. 3 times daily as needed 9.  Senokot S2 tablets p.o. twice daily 10.  Magnesium oxide 400 mg daily  30-35 minutes were spent completing discharge summary and discharge planning  Discharge Instructions    Ambulatory referral to Neurology   Complete by: As directed    An appointment is requested in approximately 4 weeks right thalamic infarction   Ambulatory referral to Physical Medicine Rehab   Complete by: As directed    Moderate complexity follow-up 1 to 2 weeks right thalamic infarction       Follow-up Information    Kirsteins, Victorino Sparrow, MD Follow up.   Specialty: Physical Medicine and Rehabilitation Why: Office to call for appointment Contact information: [address] [place] [address] [phone]        Orpah Cobb, MD Follow up.   Specialty: Cardiology Why: Call for appointment Contact information: [address] [place] [address] [phone]               Signed: Mcarthur Rossetti Massimiliano Rohleder 10/20/2019, 5:11 AM

## 2019-10-19 NOTE — Progress Notes (Signed)
Occupational Therapy Session Note  Patient Details  Name: [name] Good MRN: [id number] Date of Birth: Mar 16, 1951  Today's Date: 10/19/2019 OT Individual Time: 1100-1154 and 1500-1530 OT Individual Time Calculation (min): 54 min and 30 mins   Short Term Goals: Week 2:  OT Short Term Goal 1 (Week 2): STGs=LTGs due to ELOS  Skilled Therapeutic Interventions/Progress Updates:    Session 1: Upon entering the room, pt supine in bed with no c/o pain. Pt requesting to shower this session. Pt ambulating into bathroom with RW and CGA. Pt sitting on shower chair and doffing clothing items with increased time and CGA for safety. Pt bathing while seated on shower chair with supervision overall but CGA to lift off chair to wash buttocks and peri area. Pt reminded that based on home set up she will laterally lean on bench to perform this task safely at home. Pt exiting the bathroom and donning clothing items with sit <>stand from EOB with CGA for balance. Pt requesting to keep socks off feet and return to supine secondary to fatigue. Pt in bed with call bell and all needed items within reach upon exiting the room.   Session 2: Upon entering the room, pt seated in wheelchair awaiting OT arrival with c/o lower back pain but reports having taken medication prior to OT arrival. Pt agreeable to B UE strengthening exercises with use of level 2 resistive theraband. OT providing demonstrations and pt returning demonstrations for 3 sets of 10 chest pulls, shoulder diagonals, shoulder elevation, bicep curls, and alternating punches with min cuing for proper technique. Pt remained in wheelchair with RN returning with additional medication at end of session. Chair alarm belt donned and call bell within reach.   Therapy Documentation Precautions:  Precautions Precautions: Fall Restrictions Weight Bearing Restrictions: No   Therapy/Group: Individual Therapy  Alen Bleacher 10/19/2019, 11:55 AM

## 2019-10-19 NOTE — Progress Notes (Signed)
Speech Language Pathology Discharge Summary  Patient Details  Name: [name] Good MRN: [id number] Date of Birth: 03-12-1951  Today's Date: 10/20/2019 SLP Individual Time: 1335-1400 SLP Individual Time Calculation (min): 25 min   Skilled Therapeutic Interventions:  Skilled treatment session focused on cognitive goals. Upon arrival, patient had recently received her discharge papers and asked to go through the information. Patient recalled that she was supposed to get a coupon for a medication but was unable to locate it. The PA provided the coupon per SLP request. Patient demonstrated appropriate anticipatory awareness in regards to verbalizing a list of activities she can do safely at home and recalled memory compensatory strategies to maximize recall with overall supervision verbal cues. Patient left upright in wheelchair with NT present.    Patient has met 5 of 5 long term goals.  Patient to discharge at overall Supervision level.   Reasons goals not met: N/A  Clinical Impression/Discharge Summary:   Pt made great progress meeting 5 out 5 goals discharging at supervision A. Pt demonstrated improvement in cognitive skills scoring 17 out 30 on MOCA version 7.1 at evaluation and 29 out 30 (n=>26) on MOCA version 7.2 at discharge. SLP facilitated short term recall with use of memory notebook and compensatory strategies, functional basic to mildly complex problem solving task in time, money and medication management. Pt demonstrated improve insight of anticipatory awareness and ability to alternate attention. Pt demonstrated deficits in complex problem solving task with organization and error awareness. Education was completed with daughter who will be the caregiver, all questions answered to satisfaction. Pt benefited from skilled ST services in order to maximize functional independence and reduce burden of care, requiring intermittent supervision A and continued ST services for a short period of time  targeting mildly to complex problem solving.   Care Partner:  Caregiver Able to Provide Assistance: Yes  Type of Caregiver Assistance: Physical;Cognitive  Recommendation:  24 hour supervision/assistance;Home Health SLP  Rationale for SLP Follow Up: Maximize cognitive function and independence;Reduce caregiver burden   Equipment: N/A   Reasons for discharge: Discharged from hospital;Treatment goals met   Patient/Family Agrees with Progress Made and Goals Achieved: Yes    [place] Revoir, [place] 10/20/2019, 6:18 AM

## 2019-10-19 NOTE — Progress Notes (Signed)
Physical Therapy Session Note  Patient Details  Name: [name] Good MRN: [id number] Date of Birth: January 18, 1951  Today's Date: 10/19/2019 PT Individual Time: 0805-0905 PT Individual Time Calculation (min): 60 min   Short Term Goals: Week 2:  PT Short Term Goal 1 (Week 2): STG=LTG due to ELOS  Skilled Therapeutic Interventions/Progress Updates:   Pt received sitting  EOB and agreeable to PT. Pt performed lower body dressing with RW. PT assisted to don shoes. Ambulatory transfer to toilet for BM. Able to perform clothing management with supervision assist. PT performed pericare. Pt transported to atrium in WC.   Gait training with RW x 115 with supervision assist for safety in atrium of hosptial. Pt performed 2 short standing rest breaks, but no seated rest required.   Standing NMR:  Hip abduction x 8 BLE semitandem 2x10 sec BLE.  Sit<>stand x 6 with supervision assist and cues for equal WB in BLE.  HS curls. Min-mod assist for improved R weight shift when standing on RLE to Prevent L LOB.   Kinetron reciprocal movement training to prevent BLE marches 2 x 3 min with therapeutic rest break between bouts. Cues for full ROM on the L   Patient returned to room and left sitting in [facility] with call bell in reach and all needs met.           Therapy Documentation Precautions:  Precautions Precautions: Fall Restrictions Weight Bearing Restrictions: No  Pain: Pain Assessment Pain Scale: 0-10 Pain Score: 7  Pain Type: Acute pain Pain Location: Back Pain Orientation: Lower;Right;Left Pain Descriptors / Indicators: Aching;Discomfort Pain Frequency: Intermittent Pain Onset: Gradual Patients Stated Pain Goal: 3 Pain Intervention(s): Medication (See eMAR)    Therapy/Group: Individual Therapy  Lorie Phenix 10/19/2019, 9:08 AM

## 2019-10-20 ENCOUNTER — Inpatient Hospital Stay (HOSPITAL_COMMUNITY): Payer: Medicare Other | Admitting: Occupational Therapy

## 2019-10-20 ENCOUNTER — Inpatient Hospital Stay (HOSPITAL_COMMUNITY): Payer: Medicare Other | Admitting: Physical Therapy

## 2019-10-20 ENCOUNTER — Inpatient Hospital Stay (HOSPITAL_COMMUNITY): Payer: Medicare Other | Admitting: Speech Pathology

## 2019-10-20 ENCOUNTER — Inpatient Hospital Stay (HOSPITAL_COMMUNITY): Payer: Medicare Other

## 2019-10-20 NOTE — Progress Notes (Signed)
Inpatient Rehabilitation Care Coordinator  Discharge Note  The overall goal for the admission was met for:   Discharge location: Yes, home  Length of Stay: Yes, 15 Days  Discharge activity level: Yes  Home/community participation: Yes  Services provided included: MD, RD, PT, OT, SLP, RN, CM, TR, Pharmacy, Neuropsych and SW  Financial Services: Other: [employer]  Follow-up services arranged: Home Health: [facility]  Comments (or additional information):  Patient/Family verbalized understanding of follow-up arrangements: Yes  Individual responsible for coordination of the follow-up plan: Mikiyah, Glasner  Confirmed correct DME delivered: Dyanne Iha 10/20/2019    Dyanne Iha

## 2019-10-20 NOTE — Progress Notes (Signed)
Physical Therapy Discharge Summary  Patient Details  Name: [name] Good MRN: [id number] Date of Birth: 1950-12-19  Today's Date: 10/20/2019 PT Individual Time: 3086-5784 PT Individual Time Calculation (min): 55 min    Patient has met 9 of 9 long term goals due to improved activity tolerance, improved balance, improved postural control, increased strength, decreased pain, ability to compensate for deficits, functional use of  left upper extremity and left lower extremity, improved attention, improved awareness and improved coordination.  Patient to discharge at an ambulatory level Supervision.   Patient's care partner is independent to provide the necessary physical assistance at discharge.  Reasons goals not met: All PT goals Met   Recommendation:  Patient will benefit from ongoing skilled PT services in home health setting to continue to advance safe functional mobility, address ongoing impairments in balance, safety, tranfers, gait, endurance, strength, pain, posture, stair mang, and minimize fall risk.  Equipment: Pt has RW.   Reasons for discharge: treatment goals met and discharge from hospital  Patient/family agrees with progress made and goals achieved: Yes   PT treatment:  Pt received sitting EOB and agreeable to PT. PT instructed pt in Grad day assessment to measure progress toward goals. See below for details. Gait training with RW x 178f with supervision assist from PT, mimc cues for posture, mild increase in LBP last 10 ft of gait training. Stair management x 4 steps with min-CGA from PT and one rail on the L. Min cues for step to gait pattern and problem solving to ascend steps and limit lumbar rotation and prevent increased back pain. Car transfer training with RW and min assist for LE assist to car due to poor rotation technique 2/2 LPB. Gait training up/down ramp with RW and rest at top and bottom. [facility] and  all needs met.       PT Discharge Precautions/Restrictions Restrictions Weight Bearing Restrictions: No Pain Pain Assessment Pain Scale: 0-10 Pain Score: 6  Pain Type: Acute pain Pain Location: Back Pain Descriptors / Indicators: Aching Pain Frequency: Intermittent Pain Onset: Gradual Pain Intervention(s): Medication (See eMAR) Vision/Perception  Perception Perception: Within Functional Limits Praxis Praxis: Intact  Cognition Orientation Level: Oriented X4 Sensation Sensation Light Touch: Impaired Detail Peripheral sensation comments: mild parasthesia inteh LLE Coordination Gross Motor Movements are Fluid and Coordinated: Yes Fine Motor Movements are Fluid and Coordinated: Yes Heel Shin Test: decreased speed BLE  Motor  Motor Motor: Hemiplegia Motor - Skilled Clinical Observations: mild L hemiplegia  Mobility  Bed Mobility Bed Mobility: Supine to Sit;Sit to Supine;Rolling Right;Rolling Left Rolling Right: Supervision/verbal cueing Rolling Left: Supervision/Verbal cueing Supine to Sit: Supervision/Verbal cueing Sit to Supine: Supervision/Verbal cueing Transfers Sit to Stand: Supervision/Verbal cueing Stand to Sit: Supervision/Verbal cueing Transfer (Assistive device): Rolling walker  Locomotion  Gait Ambulation: Yes Gait Assistance: Supervision/Verbal cueing Gait Distance (Feet): 100 Feet Assistive device: Rolling walker Gait Gait: Yes Gait Pattern: Impaired Gait Pattern: Antalgic Stairs / Additional Locomotion Stairs: Yes Number of Stairs: 4 Height of Stairs: 6 Ramp: Contact Guard/touching assist Wheelchair Mobility Wheelchair Mobility: Yes Wheelchair Assistance: SChartered loss adjuster Both upper extremities Wheelchair Parts Management: Needs assistance Distance: 150  Trunk/Postural Assessment  Cervical Assessment Cervical Assessment: Ex[facility] to [facility](forward head) Thoracic Assessment Thoracic Assessment: Exceptions  to [facility](kyphotic) Lumbar Assessment Lumbar Assessment: Exceptions to [facility](posterior pelvic tilt. lateral shift to the L) Postural Control Postural Control: Deficits on evaluation (lateral deviation to  the L)  Balance Balance Balance Assessed: Yes Static Sitting Balance Static Sitting - Balance Support: Feet supported;Bilateral upper extremity supported Static Sitting - Level of Assistance: 6: Modified independent (Device/Increase time) Dynamic Sitting Balance Dynamic Sitting - Balance Support: Feet supported Dynamic Sitting - Level of Assistance: 6: Modified independent (Device/Increase time) Static Standing Balance Static Standing - Balance Support: Right upper extremity supported Static Standing - Level of Assistance: 6: Modified independent (Device/Increase time) Dynamic Standing Balance Dynamic Standing - Balance Support: Right upper extremity supported;Bilateral upper extremity supported;During functional activity Dynamic Standing - Level of Assistance: 5: Stand by assistance Extremity Assessment      RLE Assessment RLE Assessment: Within Functional Limits Active Range of Motion (AROM) Comments: WFL General Strength Comments: grossly 4+/5 to 5/5 LLE Assessment LLE Assessment: Exceptions to [facility] LLE Strength Left Hip Flexion: 4-/5 Left Knee Flexion: 4/5 Left Knee Extension: 4+/5 Left Ankle Dorsiflexion: 4/5 (only able to apply minimal resistance due to pain) Left Ankle Plantar Flexion: 4-/5 (unable to provide resistance due to pain)    Lorie Phenix 10/20/2019, 10:07 AM

## 2019-10-20 NOTE — Progress Notes (Signed)
Occupational Therapy Discharge Summary  Patient Details  Name: [name] Good MRN: [id number] Date of Birth: [birth year]  Today's Date: 10/20/2019 OT Individual Time: 1015-1056 OT Individual Time Calculation (min): 41 min   Patient has met 12 of 12 long term goals due to improved activity tolerance, improved balance, postural control, ability to compensate for deficits, improved awareness and improved coordination.  Patient to discharge at [facility] Assist level. Patient's daughter completed hands on shower transfer training during PT family education.      All goals met.   Recommendation:  Patient will benefit from ongoing skilled OT services in home health setting to continue to advance functional skills in the area of BADL.  Equipment: TTB  Reasons for discharge: treatment goals met and discharge from hospital  Patient/family agrees with progress made and goals achieved: Yes  Skilled Therapeutic Intervention:  Pt greeted in the recliner, paying bills using her room phone. She expressed feeling a bit overwhelmed by planning for d/c. Stated that the TTB she ordered through the hospital was out of stock, wanting to know if she could cancel this order so she could pick one up on the way home from the hospital with family. OT assisted her with reading SW number on her white board so she could call SW and inquire about this. While pt called the DME company after, OT printed her an UE HEP using the orange theraband. Reviewed exercise techniques together with pt completing 10 reps 2 sets given instruction. Encouraged daily engagement in her theraband HEP and also her theraputty HEP, encouraged creating shapes for intrinsic muscle strengthening when using putty. Also guided pt through gentle UB stretches for pain mgt, educated pt to look into [employer] for arthritic pain mgt for hands specifically. Discussed using hand stretches completed in therapy today and in previous sessions for pain mgt as  well. Encouraged use of diaphragmatic breathing and aromatherapy techniques for stress mgt at home. At end of session pt remained sitting in her w/c with all needs within reach.   OT Discharge Pain: RN made aware of her request for pain medicine (x2 during session) Pain Assessment Pain Scale: 0-10 Pain Score: 7  Pain Type: Acute pain Pain Location: Back Pain Orientation: Right;Left Pain Descriptors / Indicators: Aching Pain Frequency: Intermittent Pain Onset: With Activity Pain Intervention(s): Medication (See eMAR) ADL ADL Eating: Independent Grooming: Independent Upper Body Bathing: Setup Where Assessed-Upper Body Bathing: Shower Lower Body Bathing: Contact guard Where Assessed-Lower Body Bathing: Shower Upper Body Dressing: Setup Where Assessed-Upper Body Dressing: Wheelchair Lower Body Dressing: Contact guard Where Assessed-Lower Body Dressing: Chair Toileting: Contact guard Where Assessed-Toileting: Glass blower/designer: Therapist, music Method: Ambulating (with RW) Social research officer, government Method: Ambulating (RW) Youth worker: Tourist information centre manager Arousal/Alertness: Awake/alert Orientation Level: Oriented X4 Mobility    CGA ambulatory toilet + TTB transfers using RW Balance Balance Balance Assessed: Yes Static Sitting Balance Static Sitting - Balance Support: Feet supported;Bilateral upper extremity supported Static Sitting - Level of Assistance: 6: Modified independent (Device/Increase time) Dynamic Sitting Balance Dynamic Sitting - Balance Support: Feet supported;During functional activity;No upper extremity supported Dynamic Sitting - Level of Assistance: 6: Modified independent (Device/Increase time) (donning footwear) Dynamic Standing Balance Dynamic Standing - Balance Support: During functional activity;No upper extremity supported Dynamic Standing - Level of Assistance: 4: Min assist Dynamic Standing - Balance Activities:  Lateral lean/weight shifting;Forward lean/weight shifting (toileting tasks) Extremity/Trunk Assessment RUE Assessment RUE Assessment: Within Functional Limits LUE Assessment LUE Assessment: Within Functional  Limits   Rula [place] A Warrick Llera 10/20/2019, 12:45 PM

## 2019-10-20 NOTE — Progress Notes (Signed)
Occupational Therapy Session Note  Patient Details  Name: BURNADETTE BASKETT MRN: [id number] Date of Birth: 11-22-1950  Today's Date: 10/20/2019 OT Individual Time: 1500-1600 OT Individual Time Calculation (min): 60 min    Short Term Goals: Week 1:  OT Short Term Goal 1 (Week 1): Pt will perform LB dressing with mod A overall. OT Short Term Goal 1 - Progress (Week 1): Met OT Short Term Goal 2 (Week 1): Pt will perform toileting with mod A overall. OT Short Term Goal 2 - Progress (Week 1): Met OT Short Term Goal 3 (Week 1): Pt will perform toilet transfer with mod A overall. OT Short Term Goal 3 - Progress (Week 1): Met OT Short Term Goal 4 (Week 1): Pt will perform UB dressing with min A overall. OT Short Term Goal 4 - Progress (Week 1): Met  Skilled Therapeutic Interventions/Progress Updates:    1:1. Pt received in bed agreeable ot OT. Pt completes EOB>toilet with RW at ambulatory level with S for CM, however requires increased time to power up and VC for hand palcement. A provided for posterior hygiene. Pt stands at sink with S to wash hands and trasnfers into w/c. Pt agreeable to going to the gift shop. Pt propels w/c around gift shop to practice close quarters w/c mobility around obstacles for BUE endurance while also reaching for items in mod ranges outside BOS pt is interested in for dynamic sitting balnace. Exited session with pt seated in w/c, exit alarm on and call light in reach  Therapy Documentation Precautions:  Precautions Precautions: Fall Restrictions Weight Bearing Restrictions: No General:   Vital Signs: Therapy Vitals Temp: (!) 97.5 F (36.4 C) Pulse Rate: 93 Resp: 16 BP: 117/62 Patient Position (if appropriate): Sitting Oxygen Therapy SpO2: 98 % O2 Device: Room Air Pain: Pain Assessment Pain Score: 0-No pain ADL: ADL Eating: Independent Grooming: Independent Upper Body Bathing: Setup Where Assessed-Upper Body Bathing: Shower Lower Body Bathing:  Contact guard Where Assessed-Lower Body Bathing: Shower Upper Body Dressing: Setup Where Assessed-Upper Body Dressing: Wheelchair Lower Body Dressing: Contact guard Where Assessed-Lower Body Dressing: Chair Toileting: Contact guard Where Assessed-Toileting: Glass blower/designer: Therapist, music Method: Ambulating (with RW) Social research officer, government Method: Ambulating (RW) Youth worker: Gaffer Baseline Vision/History: Wears glasses Wears Glasses: Reading only Patient Visual Report: No change from baseline Vision Assessment?: No apparent visual deficits Perception  Perception: Within Functional Limits Praxis Praxis: Intact Exercises:   Other Treatments:     Therapy/Group: Individual Therapy  Tonny Branch 10/20/2019, 3:21 PM

## 2019-10-20 NOTE — Plan of Care (Signed)
  Problem: RH Balance Goal: LTG: Patient will maintain dynamic sitting balance (OT) Description: LTG:  Patient will maintain dynamic sitting balance with assistance during activities of daily living (OT) Outcome: Completed/Met Goal: LTG Patient will maintain dynamic standing with ADLs (OT) Description: LTG:  Patient will maintain dynamic standing balance with assist during activities of daily living (OT)  Outcome: Completed/Met   Problem: Sit to Stand Goal: LTG:  Patient will perform sit to stand in prep for activites of daily living with assistance level (OT) Description: LTG:  Patient will perform sit to stand in prep for activites of daily living with assistance level (OT) Outcome: Completed/Met   Problem: RH Grooming Goal: LTG Patient will perform grooming w/assist,cues/equip (OT) Description: LTG: Patient will perform grooming with assist, with/without cues using equipment (OT) Outcome: Completed/Met   Problem: RH Bathing Goal: LTG Patient will bathe all body parts with assist levels (OT) Description: LTG: Patient will bathe all body parts with assist levels (OT) Outcome: Completed/Met   Problem: RH Dressing Goal: LTG Patient will perform upper body dressing (OT) Description: LTG Patient will perform upper body dressing with assist, with/without cues (OT). Outcome: Completed/Met Goal: LTG Patient will perform lower body dressing w/assist (OT) Description: LTG: Patient will perform lower body dressing with assist, with/without cues in positioning using equipment (OT) Outcome: Completed/Met   Problem: RH Toileting Goal: LTG Patient will perform toileting task (3/3 steps) with assistance level (OT) Description: LTG: Patient will perform toileting task (3/3 steps) with assistance level (OT)  Outcome: Completed/Met   Problem: RH Toilet Transfers Goal: LTG Patient will perform toilet transfers w/assist (OT) Description: LTG: Patient will perform toilet transfers with assist,  with/without cues using equipment (OT) Outcome: Completed/Met   Problem: RH Tub/Shower Transfers Goal: LTG Patient will perform tub/shower transfers w/assist (OT) Description: LTG: Patient will perform tub/shower transfers with assist, with/without cues using equipment (OT) Outcome: Completed/Met   Problem: RH Attention Goal: LTG Patient will demonstrate this level of attention during functional activites (OT) Description: LTG:  Patient will demonstrate this level of attention during functional activites  (OT) Outcome: Completed/Met   Problem: RH Awareness Goal: LTG: Patient will demonstrate awareness during functional activites type of (OT) Description: LTG: Patient will demonstrate awareness during functional activites type of (OT) Outcome: Completed/Met

## 2019-10-20 NOTE — Plan of Care (Signed)
  Problem: RH Balance Goal: LTG Patient will maintain dynamic sitting balance (PT) Description: LTG:  Patient will maintain dynamic sitting balance with assistance during mobility activities (PT) Outcome: Completed/Met Goal: LTG Patient will maintain dynamic standing balance (PT) Description: LTG:  Patient will maintain dynamic standing balance with assistance during mobility activities (PT) Outcome: Completed/Met   Problem: Sit to Stand Goal: LTG:  Patient will perform sit to stand with assistance level (PT) Description: LTG:  Patient will perform sit to stand with assistance level (PT) Outcome: Completed/Met   Problem: RH Bed Mobility Goal: LTG Patient will perform bed mobility with assist (PT) Description: LTG: Patient will perform bed mobility with assistance, with/without cues (PT). Outcome: Completed/Met   Problem: RH Bed to Chair Transfers Goal: LTG Patient will perform bed/chair transfers w/assist (PT) Description: LTG: Patient will perform bed to chair transfers with assistance (PT). Outcome: Completed/Met   Problem: RH Car Transfers Goal: LTG Patient will perform car transfers with assist (PT) Description: LTG: Patient will perform car transfers with assistance (PT). Outcome: Completed/Met   Problem: RH Ambulation Goal: LTG Patient will ambulate in controlled environment (PT) Description: LTG: Patient will ambulate in a controlled environment, # of feet with assistance (PT). Outcome: Completed/Met Goal: LTG Patient will ambulate in home environment (PT) Description: LTG: Patient will ambulate in home environment, # of feet with assistance (PT). Outcome: Completed/Met   Problem: RH Stairs Goal: LTG Patient will ambulate up and down stairs w/assist (PT) Description: LTG: Patient will ambulate up and down # of stairs with assistance (PT) Outcome: Completed/Met     Barrie Folk PT, DPT   1:58 PM 10/20/19

## 2019-10-20 NOTE — Progress Notes (Signed)
[place] PHYSICAL MEDICINE & REHABILITATION PROGRESS NOTE   Subjective/Complaints:  Seen in physical therapy, just completed car transfer training.  No new complaints.  Does have chronic low back pain sometimes gets aggravated with certain movements.  No radicular pain.  ROS: Patient denies CP, SOB, N/V/D    Objective:   No results found. Recent Labs    10/18/19 0545  WBC 6.6  HGB 12.3  HCT 38.1  PLT 329   Recent Labs    10/18/19 0545  NA 139  K 3.8  CL 105  CO2 25  GLUCOSE 123*  BUN 13  CREATININE 0.75  CALCIUM 9.1   No intake or output data in the 24 hours ending 10/20/19 1611   Physical Exam: Vital Signs Blood pressure 117/62, pulse 93, temperature (!) 97.5 F (36.4 C), resp. rate 16, weight 88.1 kg, SpO2 98 %. Eyes without d/c no scleral edema , non injected, EOMI, visual fields intact   General: No acute distress Mood and affect are appropriate Heart: Regular rate and rhythm no rubs murmurs or extra sounds Lungs: Clear to auscultation, breathing unlabored, no rales or wheezes Abdomen: Positive bowel sounds, soft nontender to palpation, nondistended Extremities: No clubbing, cyanosis, or edema   Neurologic: Cranial nerves II through XII intact, motor strength is 4/5 in bilateral deltoid, bicep, tricep, grip, hip flexor, knee extensors, ankle dorsiflexor and plantar flexor  Musculoskeletal: Full range of motion in all 4 extremities. No joint swelling  Assessment/Plan: 1. Functional deficits secondary to Right thalamic infarct   which require 3+ hours per day of interdisciplinary therapy in a comprehensive inpatient rehab setting.  Physiatrist is providing close team supervision and 24 hour management of active medical problems listed below.  Physiatrist and rehab team continue to assess barriers to discharge/monitor patient progress toward functional and medical goals  Care Tool:  Bathing  Bathing activity did not occur: Refused Body parts bathed  by patient: Left arm, Chest, Right arm, Abdomen, Front perineal area, Buttocks, Right upper leg, Left upper leg, Right lower leg, Left lower leg, Face   Body parts bathed by helper: Right arm, Left arm     Bathing assist Assist Level: Contact Guard/Touching assist (per most recent staff documentation)     Upper Body Dressing/Undressing Upper body dressing Upper body dressing/undressing activity did not occur (including orthotics): Refused What is the patient wearing?: Dress    Upper body assist Assist Level: Set up assist (per most recent staff documentation)    Lower Body Dressing/Undressing Lower body dressing    Lower body dressing activity did not occur: Refused What is the patient wearing?: Underwear/pull up (per most recent staff documentation)     Lower body assist Assist for lower body dressing: Contact Guard/Touching assist     Toileting Toileting Toileting Activity did not occur (Clothing management and hygiene only): Refused  Toileting assist Assist for toileting: Contact Guard/Touching assist (per most recent staff documentation)     Transfers Chair/bed transfer  Transfers assist  Chair/bed transfer activity did not occur: Refused  Chair/bed transfer assist level: Supervision/Verbal cueing     Locomotion Ambulation   Ambulation assist   Ambulation activity did not occur: Safety/medical concerns (unable due to pain limiting WBing on feet)  Assist level: Supervision/Verbal cueing Assistive device: Walker-rolling Max distance: 100   Walk 10 feet activity   Assist  Walk 10 feet activity did not occur: Safety/medical concerns  Assist level: Supervision/Verbal cueing Assistive device: Walker-rolling   Walk 50 feet activity   Assist  Walk 50 feet with 2 turns activity did not occur: Safety/medical concerns  Assist level: Supervision/Verbal cueing Assistive device: Walker-rolling    Walk 150 feet activity   Assist Walk 150 feet activity did  not occur: Safety/medical concerns         Walk 10 feet on uneven surface  activity   Assist Walk 10 feet on uneven surfaces activity did not occur: Safety/medical concerns   Assist level: Supervision/Verbal cueing     Wheelchair     Assist Will patient use wheelchair at discharge?:  (TBD) Type of Wheelchair: Manual    Wheelchair assist level: Supervision/Verbal cueing Max wheelchair distance: 150    Wheelchair 50 feet with 2 turns activity    Assist        Assist Level: Supervision/Verbal cueing   Wheelchair 150 feet activity     Assist      Assist Level: Supervision/Verbal cueing   Blood pressure 117/62, pulse 93, temperature (!) 97.5 F (36.4 C), resp. rate 16, weight 88.1 kg, SpO2 98 %.  Medical Problem List and Plan: 1.  Left side weakness secondary to right thalamic infarction             -patient may  shower.              -ELOS/Goals6/26, will need Sup - family will provide  2.  Antithrombotics: -DVT/anticoagulation: Eliquis             -antiplatelet therapy: N/A 3. Pain Management: Oxycodone and Robaxin as needed- has been c/o her feet hurting intermittently- right foot pain has hammer toes, neuropathy and PAD will order plastizote shoes. Well controlled.  4. Mood: Melatonin 3 mg nightly ineffective for sleep  -add trazodone scheduled 50mg  which may help sleep and mood  -increase gabapentin 400mg  TID  -xanax prn for anxiety, pt states she feels drowsy day after taking this med request D/C  Feels more settled now that D/C paln has been established                -antipsychotic agents: N/A 5. Neuropsych: This patient is capable of making decisions on her own behalf. 6. Skin/Wound Care: Routine skin checks 7. Fluids/Electrolytes/Nutrition: Routine in and outs with follow-up chemistries 8.  LV apical thrombus.  Contiune Eliquis as directed.  Follow-up outpatient Dr. Doylene Canard cardiology services plan repeat echocardiogram 3 months 9.   Hyperlipidemia.  Lipitor 10.  Restless leg syndrome.  Requip as needed- has significant Sx's- increased to 3mg  TID prn. Advised her to ask for medication 30 minutes prior to when she usually takes it. Better controlled with this increase 11.  History of lumbar decompression complicated by wound dehiscence 2017.  Patient used assistive device prior to admission. 12. Hypokalemia- improved to 4.1- was 2.3 on admission with significant dehydration that has resolved.   13. LE discomfort but neurovascular exam is normal 14.  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  Constipation resolved after SSE, cont  senna no sign of obstruction  16.  Visual disturbance post CVA - exam neg, reviewed MRI brain, appears most c/w prior R P-O infarct, may f/u optho as OP to check [facility]   LOS: 15 days A FACE TO [place] E Rogina Schiano 10/20/2019, 4:11 PM

## 2019-10-21 NOTE — Progress Notes (Signed)
Patient tearful at start of shift. "I'm scared, about going home." Then reports her Mom died after being discharged from hospital. Emotional support provided. PRN requip and tylenol given at 2015, for C/O RLS. C/O Right ear and right throat hurting, for past few days. Scheduled melatonin not given, patient asleep. Slept good. Alfredo Martinez A

## 2019-10-21 NOTE — Progress Notes (Signed)
Pt DC home today. Daughter present and belongings packed. C/o pain 7/10 to lower back. Prn perocet given. Pt transported to main lobby for DC.Marland Kitchen  Ross Ludwig, RN

## 2019-10-24 ENCOUNTER — Telehealth: Payer: Self-pay | Admitting: *Deleted

## 2019-10-24 NOTE — Telephone Encounter (Signed)
2nd attempt  [phone], not available, mailbox full

## 2019-10-24 NOTE — Telephone Encounter (Signed)
Attempted transitional care call.  I called the number provided on hospital discharge note [phone].  Left voicemail for patient to contact our office

## 2019-10-25 NOTE — Telephone Encounter (Signed)
Patient called back and she is doing well, HH has been out and she is making appt with Cardiologist. Gave pt appt, date, time and location.

## 2019-11-01 ENCOUNTER — Encounter: Payer: Medicare Other | Admitting: Registered Nurse

## 2019-11-02 ENCOUNTER — Other Ambulatory Visit: Payer: Self-pay

## 2019-11-02 ENCOUNTER — Encounter: Payer: Self-pay | Admitting: Registered Nurse

## 2019-11-02 ENCOUNTER — Encounter: Payer: Medicare Other | Attending: Registered Nurse | Admitting: Registered Nurse

## 2019-11-02 ENCOUNTER — Telehealth: Payer: Self-pay

## 2019-11-02 VITALS — BP 119/81 | HR 90 | Temp 98.0°F | Ht 63.0 in | Wt 192.0 lb

## 2019-11-02 DIAGNOSIS — I639 Cerebral infarction, unspecified: Secondary | ICD-10-CM | POA: Diagnosis not present

## 2019-11-02 DIAGNOSIS — I513 Intracardiac thrombosis, not elsewhere classified: Secondary | ICD-10-CM | POA: Diagnosis not present

## 2019-11-02 DIAGNOSIS — I6381 Other cerebral infarction due to occlusion or stenosis of small artery: Secondary | ICD-10-CM

## 2019-11-02 DIAGNOSIS — E785 Hyperlipidemia, unspecified: Secondary | ICD-10-CM

## 2019-11-02 DIAGNOSIS — M170 Bilateral primary osteoarthritis of knee: Secondary | ICD-10-CM | POA: Insufficient documentation

## 2019-11-02 DIAGNOSIS — G629 Polyneuropathy, unspecified: Secondary | ICD-10-CM | POA: Insufficient documentation

## 2019-11-02 NOTE — Progress Notes (Signed)
Subjective:    Patient ID: [name] Good, female    DOB: Dec 23, 1950, 69 y.o.   MRN: [id number]  HPI: [name] Good is a 69 y.o. female who is here for Hospital follow up appointment of her Right Thalamic Infarction, Left Apical Thrombus, Dyslipidemia, Polyneuropathy and Primary OA of bilateral knees.  [name] Good presented Emergency Room as a code stroke on 10/01/2019, she presented with left sided weakness and slurred speech. Neurology was consulted.  CT Head Code Stroke WO Contrast:  IMPRESSION: 1. No acute intracranial infarct or other abnormality. 2. ASPECTS is 10. 3. Chronic right parieto-occipital infarct. 4. Underlying age-related cerebral atrophy with advanced chronic microvascular ischemic disease.  CTA Head W/WO Contrast: IMPRESSION: CTA HEAD AND NECK IMPRESSION:  1. Negative CTA for emergent large vessel occlusion. 2. 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 3. Single short-segment severe distal right ACA stenosis as above. 4. Additional moderate atheromatous disease about the major arterial vasculature of the head and neck as above. No other hemodynamically significant or correctable stenosis.  CT PERFUSION IMPRESSION:  Negative CT perfusion. No evidence for acute core infarct or other perfusion deficit.  CT CERVICAL SPINE IMPRESSION:  No acute traumatic injury within the cervical spine.  MR Brain WO Contrast:  IMPRESSION: 1. 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 2. 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 3. Underlying age-related cerebral atrophy with moderate to advanced chronic microvascular ischemic disease.  Echocardiogram:  Left Ventricle: There appears to be an oblong 0.5 x 0.75 cm mobile filling defect in the LV apex, which is severely hypokinetic/aneurysmal, consistent with thrombus. Left ventricular ejection  fraction, by estimation, is 60 to 65%. The left ventricle has normal function. The left ventricle demonstrates regional wall motion abnormalities. Severe akinesis of the left ventricular, apical apical segment. Definity contrast agent was given IV to delineate the left ventricular endocardial borders. The left ventricular internal cavity size was normal in size. There is no left ventricular hypertrophy. Left ventricular diastolic parameters are consistent with Grade I diastolic dysfunction (impaired relaxation). Indeterminate filling pressures. Cardiology was consulted Regarding LV Thrombus, she is currently on Eliquis.  [name] Good was admitted to inpatient Rehabilitation on 10/05/2019 and discharged home on 10/21/2019. She is receiving home health therapy from [facility]. She states she has pain in her legs and feet and bilateral knees. She rates her pain 7. Also reports she is walking with walker in the home. Also states he has a good appetite.   Pain Inventory Average Pain 5 Pain Right Now 7 My pain is dull  In the last 24 hours, has pain interfered with the following? General activity 7 Relation with others 3 Enjoyment of life 2 What TIME of day is your pain at its worst? daytime Sleep (in general) Fair  Pain is worse with: walking, bending and standing Pain improves with: rest and medication Relief from Meds: 8  Mobility walk with assistance use a walker Do you have any goals in this area?  yes  Function retired I need assistance with the following:  meal prep, household duties and shopping Do you have any goals in this area?  yes  Neuro/Psych bladder control problems weakness numbness trouble walking  Prior Studies transitional care  Physicians involved in your care transitional care   History reviewed. No pertinent family history. Social History   Socioeconomic History  . Marital status: Married  Spouse name: [name] Good  . Number of children:  4  . Years of education: GED  . Highest education level: GED or equivalent  Occupational History  . Not on file  Tobacco Use  . Smoking status: Former Smoker    Packs/day: 2.00    Years: 40.00    Pack years: 80.00    Types: Cigarettes    Quit date: 04/10/2013    Years since quitting: 6.5  . Smokeless tobacco: Never Used  Vaping Use  . Vaping Use: Every day  . Start date: 04/11/2013  . Substances: Nicotine, Nicotine-salt  Substance and Sexual Activity  . Alcohol use: Not Currently    Comment: rarely  . Drug use: No  . Sexual activity: Not Currently  Other Topics Concern  . Not on file  Social History Narrative  . Not on file   Social Determinants of Health   Financial Resource Strain:   . Difficulty of Paying Living Expenses:   Food Insecurity:   . Worried About Programme researcher, broadcasting/film/video in the Last Year:   . Barista in the Last Year:   Transportation Needs:   . Freight forwarder (Medical):   Marland Kitchen Lack of Transportation (Non-Medical):   Physical Activity:   . Days of Exercise per Week:   . Minutes of Exercise per Session:   Stress:   . Feeling of Stress :   Social Connections:   . Frequency of Communication with Friends and Family:   . Frequency of Social Gatherings with Friends and Family:   . Attends Religious Services:   . Active Member of Clubs or Organizations:   . Attends Banker Meetings:   Marland Kitchen Marital Status:    Past Surgical History:  Procedure Laterality Date  . APPENDECTOMY     17 yrs  . BACK SURGERY    . BREAST SURGERY Left    cyst  . CARDIAC CATHETERIZATION  91  . CHOLECYSTECTOMY     69 yrs old  . LUMBAR LAMINECTOMY/DECOMPRESSION MICRODISCECTOMY N/A 06/14/2015   Procedure: Lumbar Three-Four,Lumbar Four-Five, Lumbar Five-Sacral One Laminectomy;  Surgeon: Tia Alert, MD;  Location: [facility];  Service: Neurosurgery;  Laterality: N/A;  . LUMBAR WOUND DEBRIDEMENT N/A 07/24/2015   Procedure: lumbar wound revision;  Surgeon:  Tia Alert, MD;  Location: [facility];  Service: Neurosurgery;  Laterality: N/A;  . TOTAL HIP ARTHROPLASTY Right 10/25/2015   Procedure: RIGHT TOTAL HIP ARTHROPLASTY ANTERIOR APPROACH;  Surgeon: Kathryne Hitch, MD;  Location: [facility];  Service: Orthopedics;  Laterality: Right;  . TUBAL LIGATION     Past Medical History:  Diagnosis Date  . Anxiety   . Arthritis   . Depression   . GERD (gastroesophageal reflux disease)   . Myocardial infarction (HCC) 91   no visits to cardiac dr(Kadia Abaya kelly) since 92  . Stroke (HCC)   . Wound dehiscence    lumbar   BP 119/81   Pulse 90   Temp 98 F (36.7 C)   Ht 5\' 3"  (1.6 m)   Wt 192 lb (87.1 kg)   SpO2 96%   BMI 34.01 kg/m   Opioid Risk Score:   Fall Risk Score:  `1  Depression screen PHQ 2/9  Depression screen PHQ 2/9 11/02/2019  Decreased Interest 0  Down, Depressed, Hopeless 1  PHQ - 2 Score 1  Altered sleeping 1  Tired, decreased energy 1  Change in appetite 0  Feeling bad or failure about yourself  0  Trouble concentrating 1  Moving slowly or fidgety/restless 0  PHQ-9 Score 4  Difficult doing work/chores Somewhat difficult    Review of Systems  Constitutional: Negative.   HENT: Negative.   Eyes: Negative.   Cardiovascular: Positive for leg swelling.  Gastrointestinal: Positive for constipation and nausea.  Endocrine: Negative.   Genitourinary: Positive for difficulty urinating.  Musculoskeletal: Positive for arthralgias and gait problem.  Skin: Negative.   Allergic/Immunologic: Negative.   Hematological: Negative.   Psychiatric/Behavioral: Negative.   All other systems reviewed and are negative.      Objective:   Physical Exam Vitals and nursing note reviewed.  Constitutional:      Appearance: Normal appearance.  Cardiovascular:     Rate and Rhythm: Normal rate and regular rhythm.     Pulses: Normal pulses.     Heart sounds: Normal heart sounds.  Pulmonary:     Effort: Pulmonary effort is normal.      Breath sounds: Normal breath sounds.  Musculoskeletal:     Cervical back: Normal range of motion and neck supple.     Comments: Normal Muscle Bulk and Muscle Testing Reveals:  Upper Extremities: Full ROM and Muscle Strength 5/5  Lower Extremities: Full ROM and Muscle Strength 5/5 Left Lower Extremity Flexion Produces Pain into her Left Popliteal Fossa   Skin:    General: Skin is warm and dry.  Neurological:     Mental Status: She is alert and oriented to person, place, and time.  Psychiatric:        Mood and Affect: Mood normal.        Behavior: Behavior normal.           Assessment & Plan:  1. Right Thalamic Infarction: Continue Home Health  Therapy with [facility]. She has an appointment with Neurology.  2. Left Apical Thrombus: Continue current medication regimen. Cardiology Following. Continue to Monitor. 3. Dyslipidemia: Continue current medication regimen. PCP Following. Continue to Monitor. 4.Polyneuropathy: Continue current medication regimen. Continue to monitor.  5. Primary OA of bilateral knees. Continue HEP as Tolerated. Continue current medication regimen. Continue to Monitor.   20 minutes of face to face patient care time was spent during this visit. All questions were encouraged and answered.  F/U with Dr Wynn Banker in 4-6 weeks

## 2019-11-02 NOTE — Telephone Encounter (Signed)
pt's daughter called for NP appt  Contact info is Orian Amberg, [phone]

## 2019-11-23 ENCOUNTER — Ambulatory Visit: Payer: Medicare Other | Admitting: Adult Health

## 2019-11-23 ENCOUNTER — Encounter: Payer: Self-pay | Admitting: Adult Health

## 2019-11-23 VITALS — BP 127/73 | HR 81 | Ht 62.0 in | Wt 188.0 lb

## 2019-11-23 DIAGNOSIS — E785 Hyperlipidemia, unspecified: Secondary | ICD-10-CM | POA: Diagnosis not present

## 2019-11-23 DIAGNOSIS — I6381 Other cerebral infarction due to occlusion or stenosis of small artery: Secondary | ICD-10-CM

## 2019-11-23 DIAGNOSIS — I10 Essential (primary) hypertension: Secondary | ICD-10-CM | POA: Diagnosis not present

## 2019-11-23 DIAGNOSIS — I513 Intracardiac thrombosis, not elsewhere classified: Secondary | ICD-10-CM | POA: Diagnosis not present

## 2019-11-23 DIAGNOSIS — I639 Cerebral infarction, unspecified: Secondary | ICD-10-CM

## 2019-11-23 NOTE — Patient Instructions (Signed)
Continue working with home health physical and occupational therapy for ongoing improvement  Continue Eliquis (apixaban) daily  and atorvastatin for secondary stroke prevention  Follow-up with cardiology in October for repeat echocardiogram and to discuss duration of Eliquis.  Once you are told Eliquis can be discontinued, it will be recommended to initiate aspirin 81 mg daily for secondary stroke prevention  Follow-up with physical medicine and rehab for ongoing pain management  Continue to follow up with PCP regarding cholesterol and blood pressure management  Maintain strict control of hypertension with blood pressure goal below 130/90, diabetes with hemoglobin A1c goal below 6.5% and cholesterol with LDL cholesterol (bad cholesterol) goal below 70 mg/dL.       Followup in the future with me in 3 months or call earlier if needed       Thank you for coming to see [place] at [facility]. I hope we have been able to provide you high quality care today.  You may receive a patient satisfaction survey over the next few weeks. We would appreciate your feedback and comments so that we may continue to improve ourselves and the health of our patients.

## 2019-11-23 NOTE — Progress Notes (Signed)
[facility] [address] [place] [id number] [phone]       HOSPITAL FOLLOW UP NOTE  [name] Date of Birth:  10-25-1950 Medical Record Number:  [id number]   Reason for Referral:  hospital stroke follow up    SUBJECTIVE:   CHIEF COMPLAINT:  Chief Complaint  Patient presents with  . Follow-up    tx rm here for a f/u on stroke. Pt said she still has left foot pain and swelling. Pt is with her daughter [name] Good    HPI:   [name] is a 69 y.o. female with history of CAD, depression, arthritis, GERD, RLS who presented on 10/01/2019 with L sided weakness.  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  LDL 118 initiate atorvastatin 80 mg daily.  Other stroke risk factors HTN, advanced age, former tobacco use, current EtOH use, morbid obesity and CAD/MI.  Other active problems include severe hypokalemia and dehydration due to poor p.o. intake (recent death of her mother) and RLS on Requip.  Evaluated by therapies and recommended discharge to CIR for ongoing therapy needs.  Stroke:   R thalamic infarct secondary to small vessel disease    Code Stroke CT head No acute abnormality. Small vessel disease. Atrophy. Old R parietal occipital infarct. ASPECTS 10.     CT CS no acute trauma  CTA head & neck no LVO. Proximal R ICA 50% stenosis. Severe distal R ACA short segment stenosis. Moderate atherosclerosis head and neck.   CT perfusion no core infarct, no perfusion deficits  MRI  R thalamic infarct. Old R temporal occipital cortical infarct. Small vessel disease. Atrophy.   2D Echo 0.5 x 0.7 cm mobile filling defect in the LV apex with severe hypokinetic aneurysmal segment consistent with thrombus   LDL 118  HgbA1c 5.7  Lovenox 40 mg sq  daily for VTE prophylaxis  Inconsistently taking aspirin 325 mg daily prior to admission, now on aspirin 325 mg daily. 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  Discharged home from CIR on 10/21/2019 after uncomplicated 16-day stay.  Residual deficits of right-sided weakness and paresthesias.  Continues to work with home therapy with ongoing improvement.  Use of rolling walker or cane for short distance and wheelchair for long distance.  Ambulation also limited due to chronic pain of lower back and knees as well as chronic neuropathy.  She is slowly returning to independently completing ADLs but continues to need assistance with IADLs from her daughter.  Reports bilateral lower extremity L>R with improvement after cardiologist initiated furosemide.  Continues on gabapentin 400 mg 3 times daily managed by PMR  Remains on Eliquis for LV thrombus with 3 reported mild nosebleeds lasting for less than 1 minute.  She denies excessive amount of blood or difficulty stopping amiodarone.  Denies any other bleeding issues or bruising.  Plans on following cardiology with repeat echo 3 months from discharge.  Continues on atorvastatin without myalgias.  Blood pressure today 127/73.  Continues to follow with PCP regularly for HTN and HLD management.     ROS:   14 system  review of systems performed and negative with exception of swelling, weakness, pain, numbness/tingling and gait impairment  PMH:  Past Medical History:  Diagnosis Date  . Anxiety   . Arthritis   . Depression   . GERD (gastroesophageal reflux disease)   . Myocardial infarction (HCC) 91   no visits to cardiac dr(thomas kelly) since 92  . Stroke (HCC)   . Wound  dehiscence    lumbar    PSH:  Past Surgical History:  Procedure Laterality Date  . APPENDECTOMY     17 yrs  . BACK SURGERY    . BREAST SURGERY Left    cyst  . CARDIAC CATHETERIZATION  91  . CHOLECYSTECTOMY     69 yrs old  . LUMBAR LAMINECTOMY/DECOMPRESSION MICRODISCECTOMY N/A 06/14/2015   Procedure: Lumbar Three-Four,Lumbar Four-Five, Lumbar Five-Sacral One Laminectomy;  Surgeon: Tia Alert, MD;  Location: [facility];  Service: Neurosurgery;  Laterality: N/A;  . LUMBAR WOUND DEBRIDEMENT N/A 07/24/2015   Procedure: lumbar wound revision;  Surgeon: Tia Alert, MD;  Location: [facility];  Service: Neurosurgery;  Laterality: N/A;  . TOTAL HIP ARTHROPLASTY Right 10/25/2015   Procedure: RIGHT TOTAL HIP ARTHROPLASTY ANTERIOR APPROACH;  Surgeon: Kathryne Hitch, MD;  Location: [facility];  Service: Orthopedics;  Laterality: Right;  . TUBAL LIGATION      Social History:  Social History   Socioeconomic History  . Marital status: Married    Spouse name: [name] Good  . Number of children: 4  . Years of education: GED  . Highest education level: GED or equivalent  Occupational History  . Not on file  Tobacco Use  . Smoking status: Former Smoker    Packs/day: 2.00    Years: 40.00    Pack years: 80.00    Types: Cigarettes    Quit date: 04/10/2013    Years since quitting: 6.6  . Smokeless tobacco: Never Used  Vaping Use  . Vaping Use: Every day  . Start date: 04/11/2013  . Substances: Nicotine, Nicotine-salt  Substance and Sexual Activity  . Alcohol use: Not Currently    Comment: rarely  . Drug use: No  . Sexual activity: Not Currently  Other Topics Concern  . Not on file  Social History Narrative  . Not on file   Social Determinants of Health   Financial Resource Strain:   . Difficulty of Paying Living Expenses:   Food Insecurity:   . Worried About Programme researcher, broadcasting/film/video in the Last Year:   . Barista in the Last Year:   Transportation Needs:   . Sales promotion account executive (Medical):   Marland Kitchen Lack of Transportation (Non-Medical):   Physical Activity:   . Days of Exercise per Week:   . Minutes of Exercise per Session:   Stress:   . Feeling of Stress :   Social Connections:   . Frequency of Communication with Friends and Family:   . Frequency of Social Gatherings with Friends and Family:   . Attends Religious Services:   . Active Member of Clubs or Organizations:   . Attends Banker Meetings:   Marland Kitchen Marital Status:   Intimate Partner Violence:   . Fear of Current or Ex-Partner:   . Emotionally Abused:   Marland Kitchen Physically Abused:   . Sexually Abused:     Family History: No family history on file.  Medications:   Current Outpatient Medications on File Prior to Visit  Medication Sig Dispense Refill  .  acetaminophen (TYLENOL) 325 MG tablet Take 2 tablets (650 mg total) by mouth every 4 (four) hours as needed for mild pain (or temp > 37.5 C (99.5 F)).    Marland Kitchen apixaban (ELIQUIS) 5 MG TABS tablet Take 1 tablet (5 mg total) by mouth 2 (two) times daily. 60 tablet 1  . atorvastatin (LIPITOR) 80 MG tablet Take 1 tablet (80 mg total) by mouth daily. 30 tablet 0  . calcium carbonate (TUMS EX) 750 MG chewable tablet Chew 1 tablet by mouth 2 (two) times daily as needed for heartburn.    . furosemide (LASIX) 40 MG tablet Take 40 mg by mouth.    . gabapentin (NEURONTIN) 400 MG capsule Take 1 capsule (400 mg total) by mouth 3 (three) times daily. 90 capsule 1  . magnesium oxide (MAG-OX) 400 MG tablet Take 1 tablet (400 mg total) by mouth daily. 30 tablet 0  . oxyCODONE-acetaminophen (PERCOCET/ROXICET) 5-325 MG tablet Take 1 tablet by mouth every 6 (six) hours as needed for moderate pain or severe pain. 20 tablet 0  . rOPINIRole (REQUIP) 3 MG tablet Take 1 tablet (3 mg total) by mouth at bedtime as needed (restless leg). 30 tablet 0  . tetrahydrozoline 0.05 % ophthalmic solution Place 1 drop into both eyes daily as needed (Dry eyes).     No current  facility-administered medications on file prior to visit.    Allergies:   Allergies  Allergen Reactions  . Amoxicillin Other (See Comments)    Tolerated Zosyn Has patient had a PCN reaction causing immediate rash, facial/tongue/throat swelling, SOB or lightheadedness with hypotension: No Has patient had a PCN reaction causing severe rash involving mucus membranes or skin necrosis: No Has patient had a PCN reaction that required hospitalization No Has patient had a PCN reaction occurring within the last 10 years: No If all of the above answers are "NO", then may proceed with Cephalosporin use.      OBJECTIVE:  Physical Exam  Vitals:   11/23/19 1021  BP: 127/73  Pulse: 81  Weight: 188 lb (85.3 kg)  Height: 5\' 2"  (1.575 m)   Body mass index is 34.39 kg/m. No exam data present  General: well developed, well nourished,  pleasant elderly Caucasian female, seated, in no evident distress Head: head normocephalic and atraumatic.   Neck: supple with no carotid or supraclavicular bruits Cardiovascular: regular rate and rhythm, no murmurs; BLE edema Musculoskeletal: no deformity Skin:  no rash/petichiae Vascular:  Normal pulses all extremities   Neurologic Exam Mental Status: Awake and fully alert.  Fluent speech and language. Oriented to place and time. Recent and remote memory intact. Attention span, concentration and fund of knowledge appropriate. Mood and affect appropriate.  Cranial Nerves: Fundoscopic exam reveals sharp disc margins. Pupils equal, briskly reactive to light. Extraocular movements full without nystagmus. Visual fields full to confrontation. Hearing intact. Facial sensation intact. Face, tongue, palate moves normally and symmetrically.  Motor: Normal bulk and tone. Normal strength in all tested extremity muscles except slightly decreased left hand dexterity and left hip flexor weakness. Sensory.: intact to touch , pinprick , position and vibratory sensation.   Coordination: Rapid alternating movements normal in all extremities except slightly decreased left hand. Finger-to-nose performed accurately bilaterally and heel-to-shin difficulty performing due to knee and lower back pain. Gait and Station: Deferred as currently in wheelchair and assistive device not present during visit Reflexes: 1+ and symmetric. Toes downgoing.     NIHSS  0 Modified Rankin  3  ASSESSMENT: LUREE PALLA is a 69 y.o. year old female presented with left-sided weakness on 10/01/2019 with stroke work-up revealing right thalamic infarct secondary to small vessel disease.  Echo showed LV thrombus and initiated Eliquis.  Vascular risk factors include HTN, HLD, CAD/MI, former tobacco use, EtOH use and morbid obesity.      PLAN:  1. Right thalamic stroke:  Residual deficit: Left hemiparesis with paresthesias.  Encouraged continued participation in home health PT/OT for likely ongoing improvement.  Will continue to follow with PMR for ongoing nerve pain management.   -Continue Eliquis (apixaban) daily  and atorvastatin for secondary stroke prevention.  Advised that once Eliquis course completed, will be recommended to initiate aspirin 81 mg daily for secondary stroke prevention -Close PCP follow up for aggressive stroke risk factor management  2. LV thrombus: Continue Eliquis and follow-up with cardiology in October for repeat echocardiogram and further determination on Eliquis duration 3. HTN: BP goal <130/90.  Stable today.  Continue f/u with PCP 4. HLD: LDL goal <70. Recent LDL 118.  Continue atorvastatin; f/u with PCP for management with repeat lipid panel as well as prescribing of statin    Follow up in 3 months or call earlier if needed   I spent 45 minutes of face-to-face and non-face-to-face time with patient and daughter.  This included previsit chart review, lab review, study review, order entry, electronic health record documentation, patient education  regarding recent stroke, residual deficits, importance of managing stroke risk factors and answered all questions to patient satisfaction     Ihor Austin, [facility]  [facility] [address] [place], [place] [address]  Phone [phone] Fax [phone] Note: This document was prepared with digital dictation and possible smart phrase technology. Any transcriptional errors that result from this process are unintentional.

## 2019-11-23 NOTE — Progress Notes (Signed)
I agree with the above plan 

## 2019-12-05 ENCOUNTER — Encounter: Payer: Medicare Other | Attending: Physical Medicine & Rehabilitation | Admitting: Physical Medicine & Rehabilitation

## 2019-12-05 ENCOUNTER — Other Ambulatory Visit: Payer: Self-pay

## 2019-12-05 ENCOUNTER — Encounter: Payer: Self-pay | Admitting: Physical Medicine & Rehabilitation

## 2019-12-05 VITALS — BP 130/82 | HR 79 | Temp 98.3°F | Ht 62.0 in | Wt 187.0 lb

## 2019-12-05 DIAGNOSIS — I6381 Other cerebral infarction due to occlusion or stenosis of small artery: Secondary | ICD-10-CM

## 2019-12-05 DIAGNOSIS — M170 Bilateral primary osteoarthritis of knee: Secondary | ICD-10-CM | POA: Diagnosis present

## 2019-12-05 DIAGNOSIS — I639 Cerebral infarction, unspecified: Secondary | ICD-10-CM | POA: Insufficient documentation

## 2019-12-05 DIAGNOSIS — I513 Intracardiac thrombosis, not elsewhere classified: Secondary | ICD-10-CM | POA: Insufficient documentation

## 2019-12-05 DIAGNOSIS — E785 Hyperlipidemia, unspecified: Secondary | ICD-10-CM | POA: Insufficient documentation

## 2019-12-05 DIAGNOSIS — G629 Polyneuropathy, unspecified: Secondary | ICD-10-CM | POA: Diagnosis present

## 2019-12-05 NOTE — Progress Notes (Signed)
Admit date: 10/05/2019 Discharge date: 10/21/2019  69 y.o. right-handed female with history of depression, CAD, lumbar spine fusion complicated by wound dehiscence 2017, restless leg syndrome, quit smoking 6 years ago.  Per chart review lives with spouse independent with assistive device.  Mobile home 3 steps to entry.  Presented 10/01/2019 with left-sided weakness and slurred speech.  Cranial CT scan negative for acute changes.  Chronic right parieto-occipital infarction.  CT angiogram of head and neck negative for emergent large vessel occlusion.  MRI of the brain showed a 13 mm acute ischemic nonhemorrhagic right thalamic infarction.  Echocardiogram with ejection fraction 65% grade 1 diastolic dysfunction as well as LV thrombus.  Admission chemistries with potassium 2.3 creatinine 1.08.  Initially maintained on aspirin Plavix for CVA prophylaxis changed to Eliquis after findings of LV thrombus.  Plans to follow-up cardiology services Dr. Algie Coffer in regards to LV apical thrombus with repeat echocardiogram in 3 months HPI Mod I dresing and bathing No falls   HHPT working on LE strength   OT finishing up with UE strengthening exercise Fingers are sore, trigger  Seen by Dr Algie Coffer, prescribed fluid pills and potassium Pain Inventory Average Pain 8 Pain Right Now 5 My pain is constant, aching and legs and feet, knees included  In the last 24 hours, has pain interfered with the following? General activity 7 Relation with others 8 Enjoyment of life 10 What TIME of day is your pain at its worst? morning Sleep (in general) Fair  Pain is worse with: walking, some activites and cold Pain improves with: therapy/exercise and medication Relief from Meds: 7  Mobility walk with assistance use a cane use a walker use a wheelchair needs help with transfers  Function retired I need assistance with the following:  household duties and shopping  Neuro/Psych trouble  walking depression  Prior Studies Any changes since last visit?  no  Physicians involved in your care Any changes since last visit?  no   No family history on file. Social History   Socioeconomic History  . Marital status: Married    Spouse name: Saysha Menta  . Number of children: 4  . Years of education: GED  . Highest education level: GED or equivalent  Occupational History  . Not on file  Tobacco Use  . Smoking status: Former Smoker    Packs/day: 2.00    Years: 40.00    Pack years: 80.00    Types: Cigarettes    Quit date: 04/10/2013    Years since quitting: 6.6  . Smokeless tobacco: Never Used  Vaping Use  . Vaping Use: Every day  . Start date: 04/11/2013  . Substances: Nicotine, Nicotine-salt  Substance and Sexual Activity  . Alcohol use: Not Currently    Comment: rarely  . Drug use: No  . Sexual activity: Not Currently  Other Topics Concern  . Not on file  Social History Narrative  . Not on file   Social Determinants of Health   Financial Resource Strain:   . Difficulty of Paying Living Expenses:   Food Insecurity:   . Worried About Programme researcher, broadcasting/film/video in the Last Year:   . Barista in the Last Year:   Transportation Needs:   . Freight forwarder (Medical):   Marland Kitchen Lack of Transportation (Non-Medical):   Physical Activity:   . Days of Exercise per Week:   . Minutes of Exercise per Session:   Stress:   . Feeling of Stress :  Social Connections:   . Frequency of Communication with Friends and Family:   . Frequency of Social Gatherings with Friends and Family:   . Attends Religious Services:   . Active Member of Clubs or Organizations:   . Attends Banker Meetings:   Marland Kitchen Marital Status:    Past Surgical History:  Procedure Laterality Date  . APPENDECTOMY     17 yrs  . BACK SURGERY    . BREAST SURGERY Left    cyst  . CARDIAC CATHETERIZATION  89  . CHOLECYSTECTOMY     69 yrs old  . LUMBAR LAMINECTOMY/DECOMPRESSION  MICRODISCECTOMY N/A 06/14/2015   Procedure: Lumbar Three-Four,Lumbar Four-Five, Lumbar Five-Sacral One Laminectomy;  Surgeon: Tia Alert, MD;  Location: [facility];  Service: Neurosurgery;  Laterality: N/A;  . LUMBAR WOUND DEBRIDEMENT N/A 07/24/2015   Procedure: lumbar wound revision;  Surgeon: Tia Alert, MD;  Location: [facility];  Service: Neurosurgery;  Laterality: N/A;  . TOTAL HIP ARTHROPLASTY Right 10/25/2015   Procedure: RIGHT TOTAL HIP ARTHROPLASTY ANTERIOR APPROACH;  Surgeon: Kathryne Hitch, MD;  Location: [facility];  Service: Orthopedics;  Laterality: Right;  . TUBAL LIGATION     Past Medical History:  Diagnosis Date  . Anxiety   . Arthritis   . Depression   . GERD (gastroesophageal reflux disease)   . Myocardial infarction (HCC) 89   no visits to cardiac dr(thomas kelly) since 89  . Stroke (HCC)   . Wound dehiscence    lumbar   BP 130/82   Pulse 79   Temp 98.3 F (36.8 C)   Ht 5\' 2"  (1.575 m)   Wt 187 lb (84.8 kg)   SpO2 95%   BMI 34.20 kg/m   Opioid Risk Score:   Fall Risk Score:  `1  Depression screen PHQ 2/9  Depression screen [facility] 2/9 12/05/2019 11/02/2019  Decreased Interest 3 0  Down, Depressed, Hopeless 3 1  PHQ - 2 Score 6 1  Altered sleeping - 1  Tired, decreased energy - 1  Change in appetite - 0  Feeling bad or failure about yourself  - 0  Trouble concentrating - 1  Moving slowly or fidgety/restless - 0  PHQ-9 Score - 4  Difficult doing work/chores - Somewhat difficult   Review of Systems  Constitutional: Negative.   HENT: Negative.   Eyes: Negative.   Respiratory: Negative.   Cardiovascular: Negative.   Gastrointestinal: Negative.   Endocrine: Negative.   Genitourinary: Negative.   Musculoskeletal: Positive for arthralgias and gait problem.  Skin: Negative.   Allergic/Immunologic: Negative.   Neurological: Positive for weakness.  Hematological: Bruises/bleeds easily.       Eliquis  Psychiatric/Behavioral: Positive for  dysphoric mood.       Situational  All other systems reviewed and are negative.      Objective:   Physical Exam Vitals and nursing note reviewed.  Constitutional:      Appearance: She is obese.  Eyes:     Extraocular Movements: Extraocular movements intact.     Conjunctiva/sclera: Conjunctivae normal.     Pupils: Pupils are equal, round, and reactive to light.  Musculoskeletal:     Comments: Positive clicking left fourth digit at A1 pulley but no trigger finger identified with alternating finger flexion extension  Gait wide-based she has antalgic gait valgus at the left knee and left hip.  Trendelenburg type gait   Neurological:     General: No focal deficit present.     Mental  Status: She is alert and oriented to person, place, and time.     Comments: Motor strength is 4+ bilateral deltoid by stress of grip hip flexor knee extensor ankle dorsiflexor Negative straight leg raising test Sensation is described as normal to light touch bilateral upper and lower limbs No evidence of ataxia on finger-nose-finger testing bilaterally. Speech without dysarthria or aphasia There is no evidence of visual field cut confrontation testing Extraocular muscles are intact no diplopia  Psychiatric:        Mood and Affect: Mood normal.        Behavior: Behavior normal.           Assessment & Plan:  #1.  History of thalamic infarct no significant residual cognitive or neurologic deficits. Patient still has significant orthopedic issues and with stroke she has had some decline in her gait.  I do not think she should be driving at this time 2.  Left hip osteoarthritis with joint deformity would recommend follow-up with orthopedics.  She had relatively good result with right total hip.  Would avoid elective surgery for 6 months post stroke Patient will follow-up with neurology in December 2021 Physical medicine rehab follow-up as needed She can continue home health PT, because of financial  constraints, patient does not wish to pursue outpatient therapy

## 2019-12-05 NOTE — Patient Instructions (Signed)
Cont home health  Follow up Neuro in December  See Dr Magnus Ivan for th eleft

## 2019-12-13 ENCOUNTER — Telehealth: Payer: Self-pay

## 2019-12-13 NOTE — Telephone Encounter (Signed)
Will, OT from [facility] called to add a visit for next week for medicare recert eval. Orders approved and given.

## 2020-03-08 ENCOUNTER — Inpatient Hospital Stay (HOSPITAL_COMMUNITY): Payer: Medicare Other

## 2020-03-08 ENCOUNTER — Encounter (HOSPITAL_COMMUNITY): Payer: Self-pay | Admitting: Internal Medicine

## 2020-03-08 ENCOUNTER — Other Ambulatory Visit: Payer: Self-pay

## 2020-03-08 ENCOUNTER — Inpatient Hospital Stay (HOSPITAL_COMMUNITY)
Admission: EM | Admit: 2020-03-08 | Discharge: 2020-03-13 | DRG: 065 | Disposition: A | Discharge status: Home Health | Payer: Medicare Other | Attending: Internal Medicine | Admitting: Internal Medicine

## 2020-03-08 ENCOUNTER — Emergency Department (HOSPITAL_COMMUNITY): Payer: Medicare Other

## 2020-03-08 DIAGNOSIS — I513 Intracardiac thrombosis, not elsewhere classified: Secondary | ICD-10-CM | POA: Diagnosis present

## 2020-03-08 DIAGNOSIS — I700 Atherosclerosis of aorta: Secondary | ICD-10-CM | POA: Diagnosis present

## 2020-03-08 DIAGNOSIS — Z7901 Long term (current) use of anticoagulants: Secondary | ICD-10-CM | POA: Diagnosis not present

## 2020-03-08 DIAGNOSIS — I2510 Atherosclerotic heart disease of native coronary artery without angina pectoris: Secondary | ICD-10-CM | POA: Diagnosis present

## 2020-03-08 DIAGNOSIS — F32A Depression, unspecified: Secondary | ICD-10-CM | POA: Diagnosis present

## 2020-03-08 DIAGNOSIS — Z8669 Personal history of other diseases of the nervous system and sense organs: Secondary | ICD-10-CM | POA: Diagnosis not present

## 2020-03-08 DIAGNOSIS — I10 Essential (primary) hypertension: Secondary | ICD-10-CM | POA: Diagnosis present

## 2020-03-08 DIAGNOSIS — N390 Urinary tract infection, site not specified: Secondary | ICD-10-CM

## 2020-03-08 DIAGNOSIS — G4700 Insomnia, unspecified: Secondary | ICD-10-CM | POA: Diagnosis present

## 2020-03-08 DIAGNOSIS — H53461 Homonymous bilateral field defects, right side: Secondary | ICD-10-CM | POA: Diagnosis present

## 2020-03-08 DIAGNOSIS — E669 Obesity, unspecified: Secondary | ICD-10-CM | POA: Diagnosis present

## 2020-03-08 DIAGNOSIS — Z87891 Personal history of nicotine dependence: Secondary | ICD-10-CM | POA: Diagnosis not present

## 2020-03-08 DIAGNOSIS — I252 Old myocardial infarction: Secondary | ICD-10-CM | POA: Diagnosis not present

## 2020-03-08 DIAGNOSIS — Z20822 Contact with and (suspected) exposure to covid-19: Secondary | ICD-10-CM | POA: Diagnosis present

## 2020-03-08 DIAGNOSIS — Z79899 Other long term (current) drug therapy: Secondary | ICD-10-CM | POA: Diagnosis not present

## 2020-03-08 DIAGNOSIS — G2581 Restless legs syndrome: Secondary | ICD-10-CM | POA: Diagnosis present

## 2020-03-08 DIAGNOSIS — Z96641 Presence of right artificial hip joint: Secondary | ICD-10-CM | POA: Diagnosis present

## 2020-03-08 DIAGNOSIS — K5900 Constipation, unspecified: Secondary | ICD-10-CM | POA: Diagnosis present

## 2020-03-08 DIAGNOSIS — Z6830 Body mass index (BMI) 30.0-30.9, adult: Secondary | ICD-10-CM | POA: Diagnosis not present

## 2020-03-08 DIAGNOSIS — B9620 Unspecified Escherichia coli [E. coli] as the cause of diseases classified elsewhere: Secondary | ICD-10-CM | POA: Diagnosis present

## 2020-03-08 DIAGNOSIS — E785 Hyperlipidemia, unspecified: Secondary | ICD-10-CM | POA: Diagnosis present

## 2020-03-08 DIAGNOSIS — I6389 Other cerebral infarction: Secondary | ICD-10-CM | POA: Diagnosis not present

## 2020-03-08 DIAGNOSIS — I6381 Other cerebral infarction due to occlusion or stenosis of small artery: Secondary | ICD-10-CM

## 2020-03-08 DIAGNOSIS — I639 Cerebral infarction, unspecified: Secondary | ICD-10-CM | POA: Diagnosis present

## 2020-03-08 DIAGNOSIS — I63432 Cerebral infarction due to embolism of left posterior cerebral artery: Secondary | ICD-10-CM | POA: Diagnosis present

## 2020-03-08 DIAGNOSIS — K219 Gastro-esophageal reflux disease without esophagitis: Secondary | ICD-10-CM | POA: Diagnosis present

## 2020-03-08 DIAGNOSIS — G8194 Hemiplegia, unspecified affecting left nondominant side: Secondary | ICD-10-CM | POA: Diagnosis present

## 2020-03-08 DIAGNOSIS — I63532 Cerebral infarction due to unspecified occlusion or stenosis of left posterior cerebral artery: Secondary | ICD-10-CM | POA: Diagnosis not present

## 2020-03-08 LAB — RAPID URINE DRUG SCREEN, HOSP PERFORMED
Amphetamines: NOT DETECTED
Barbiturates: NOT DETECTED
Benzodiazepines: NOT DETECTED
Cocaine: NOT DETECTED
Opiates: NOT DETECTED
Tetrahydrocannabinol: NOT DETECTED

## 2020-03-08 LAB — COMPREHENSIVE METABOLIC PANEL
ALT: 21 U/L (ref 0–44)
AST: 23 U/L (ref 15–41)
Albumin: 3.9 g/dL (ref 3.5–5.0)
Alkaline Phosphatase: 96 U/L (ref 38–126)
Anion gap: 10 (ref 5–15)
BUN: 10 mg/dL (ref 8–23)
CO2: 24 mmol/L (ref 22–32)
Calcium: 9.7 mg/dL (ref 8.9–10.3)
Chloride: 107 mmol/L (ref 98–111)
Creatinine, Ser: 0.8 mg/dL (ref 0.44–1.00)
GFR, Estimated: >60 mL/min (ref >60)
Glucose, Bld: 94 mg/dL (ref 70–99)
Potassium: 3.9 mmol/L (ref 3.5–5.1)
Sodium: 141 mmol/L (ref 135–145)
Total Bilirubin: 0.8 mg/dL (ref 0.3–1.2)
Total Protein: 7.1 g/dL (ref 6.5–8.1)

## 2020-03-08 LAB — CBC
HCT: 44.2 % (ref 36.0–46.0)
Hemoglobin: 14.6 g/dL (ref 12.0–15.0)
MCH: 30.6 pg (ref 26.0–34.0)
MCHC: 33 g/dL (ref 30.0–36.0)
MCV: 92.7 fL (ref 80.0–100.0)
Platelets: 244 10*3/uL (ref 150–400)
RBC: 4.77 MIL/uL (ref 3.87–5.11)
RDW: 12.1 % (ref 11.5–15.5)
WBC: 8.5 10*3/uL (ref 4.0–10.5)
nRBC: 0 % (ref 0.0–0.2)

## 2020-03-08 LAB — DIFFERENTIAL
Abs Immature Granulocytes: 0.02 10*3/uL (ref 0.00–0.07)
Basophils Absolute: 0.1 10*3/uL (ref 0.0–0.1)
Basophils Relative: 1 %
Eosinophils Absolute: 0.3 10*3/uL (ref 0.0–0.5)
Eosinophils Relative: 4 %
Immature Granulocytes: 0 %
Lymphocytes Relative: 29 %
Lymphs Abs: 2.5 10*3/uL (ref 0.7–4.0)
Monocytes Absolute: 0.7 10*3/uL (ref 0.1–1.0)
Monocytes Relative: 8 %
Neutro Abs: 4.9 10*3/uL (ref 1.7–7.7)
Neutrophils Relative %: 58 %

## 2020-03-08 LAB — ETHANOL: Alcohol, Ethyl (B): <10 mg/dL (ref <10)

## 2020-03-08 LAB — PROTIME-INR
INR: 1.2 (ref 0.8–1.2)
Prothrombin Time: 15 seconds (ref 11.4–15.2)

## 2020-03-08 LAB — URINALYSIS, ROUTINE W REFLEX MICROSCOPIC
Bilirubin Urine: NEGATIVE
Glucose, UA: NEGATIVE mg/dL
Ketones, ur: NEGATIVE mg/dL
Nitrite: POSITIVE — AB
Protein, ur: NEGATIVE mg/dL
Specific Gravity, Urine: 1.004 — ABNORMAL LOW (ref 1.005–1.030)
pH: 7 (ref 5.0–8.0)

## 2020-03-08 LAB — APTT: aPTT: 33 seconds (ref 24–36)

## 2020-03-08 IMAGING — CT CT HEAD W/O CM
4 series · 16 of 47 positions shown, 18 images · non-contrast
Comparison: [DATE]

CLINICAL DATA: Blurred vision, dizziness

EXAM:
CT HEAD WITHOUT CONTRAST
TECHNIQUE: Contiguous axial images were obtained from the base of the skull
through the vertex without intravenous contrast.

[Series 3: head without · axial · non-contrast · 0.44mm/px · z∈[+1351,+1471]mm · 7 of 33 slices shown, 9 images]
[im 5/33  brain]
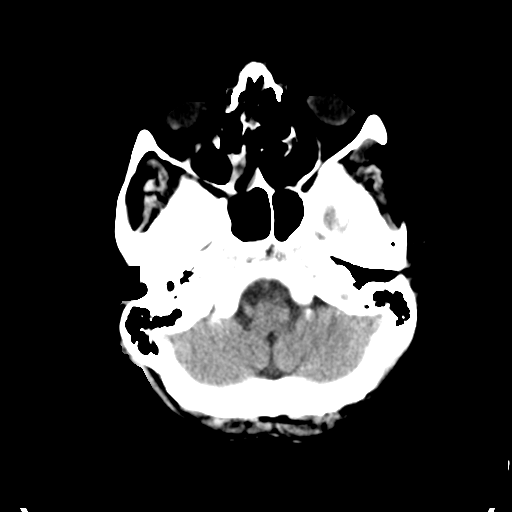
[im 5/33  bone]
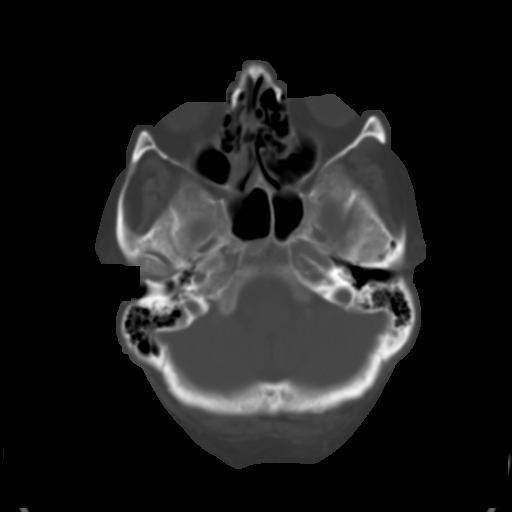
[im 9/33  brain]
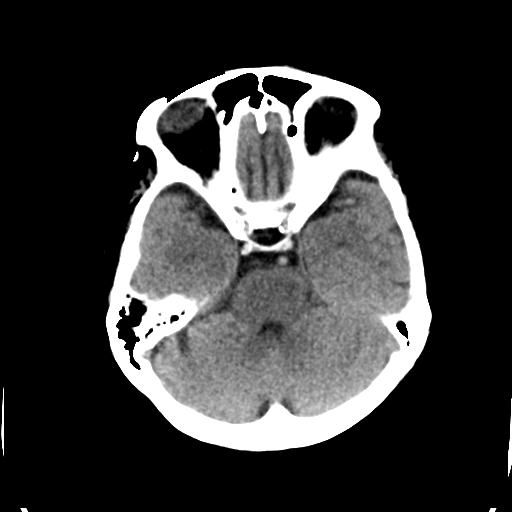
[im 13/33  brain]
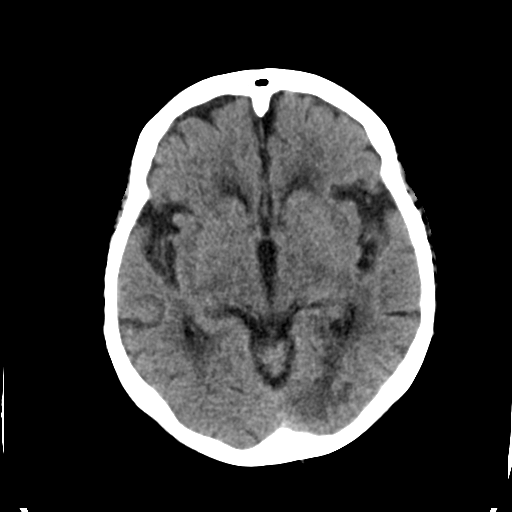
[im 17/33  brain]
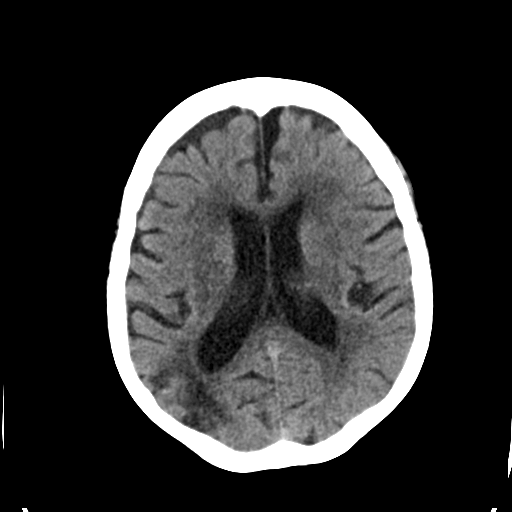
[im 21/33  brain]
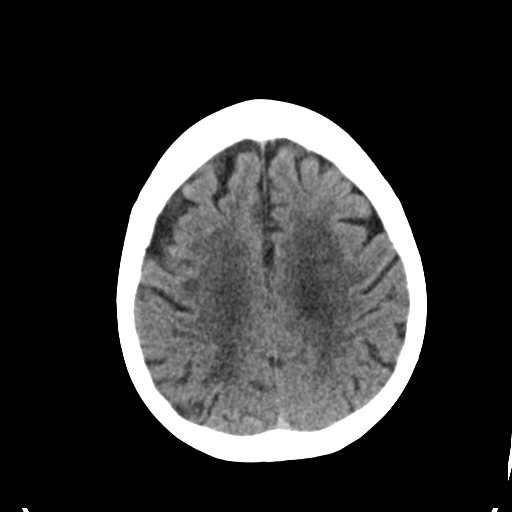
[im 21/33  bone]
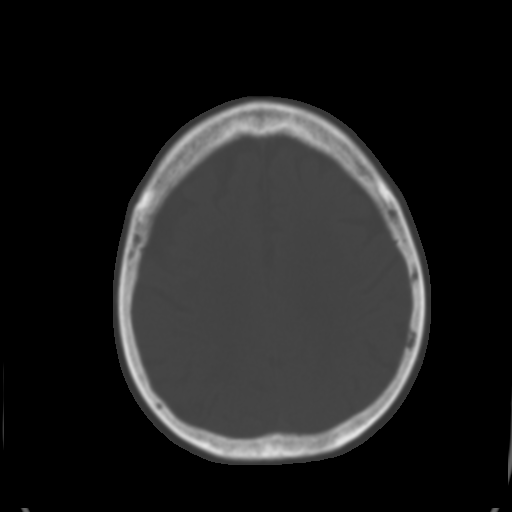
[im 25/33  brain]
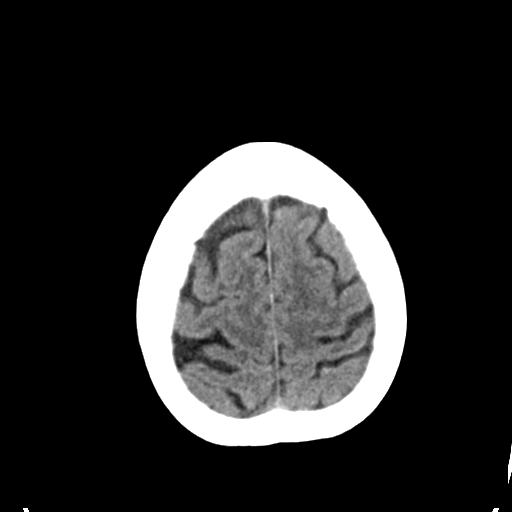
[im 29/33  brain]
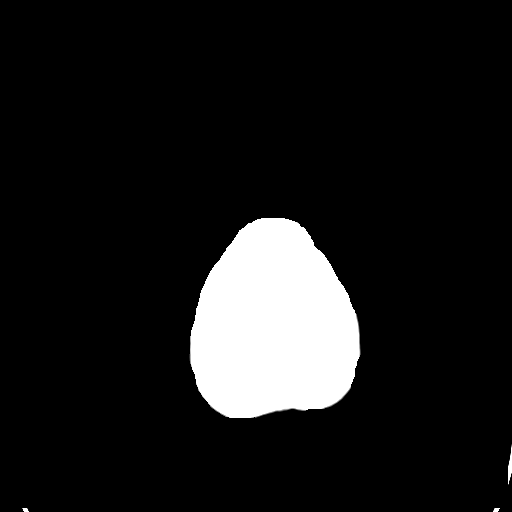

[Series 4: head bone · axial · 0.44mm/px · z∈[+1347,+1379]mm · 3 of 81 slices shown]
[im 9/81  bone]
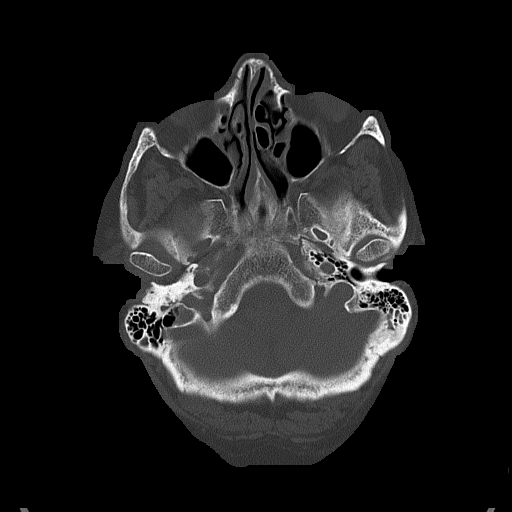
[im 17/81  bone]
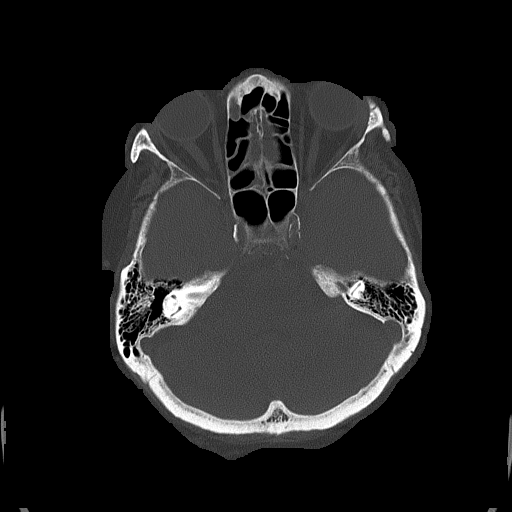
[im 25/81  bone]
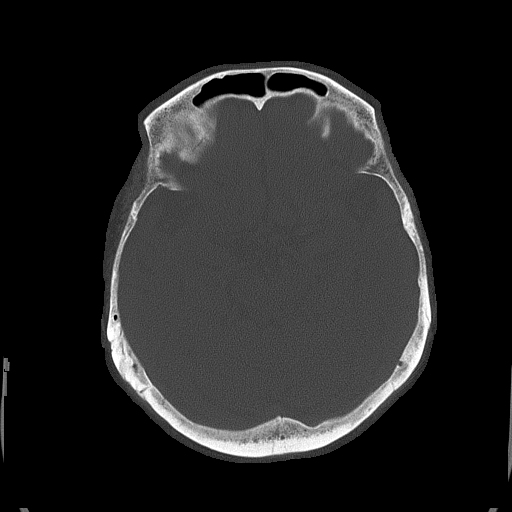

[Series 5: head without cor · coronal · non-contrast · 0.31mm/px · 3 of 67 slices shown]
[im 23/67  brain]
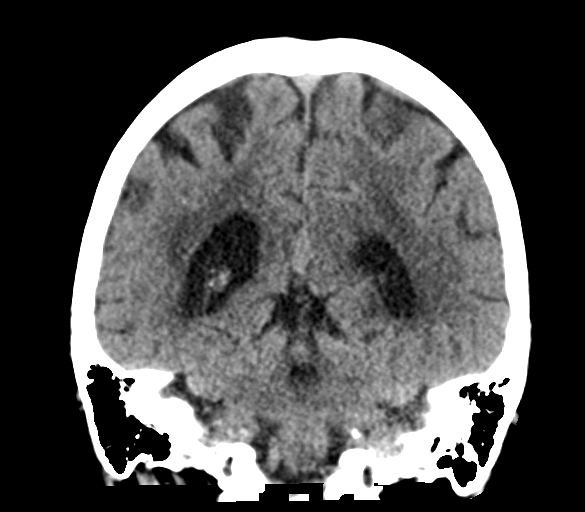
[im 30/67  brain]
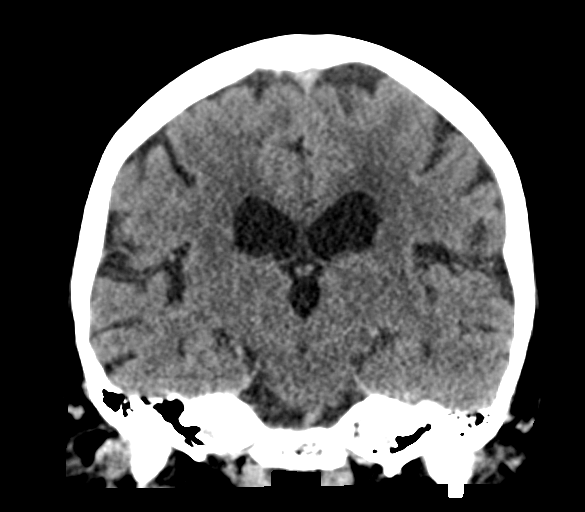
[im 37/67  brain]
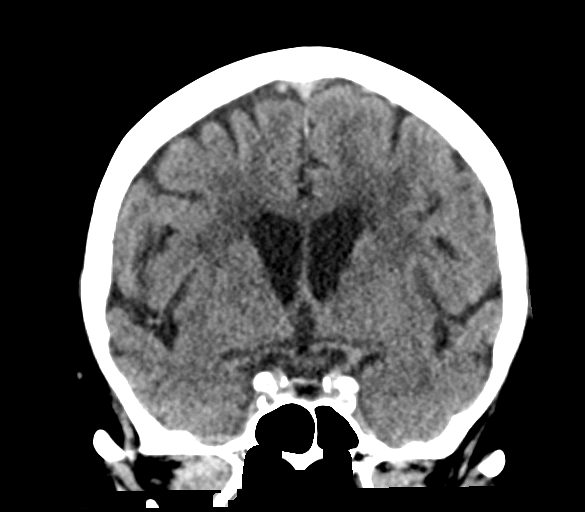

[Series 6: head without sag · sagittal · non-contrast · 0.31mm/px · 3 of 63 slices shown]
[im 21/63  brain]
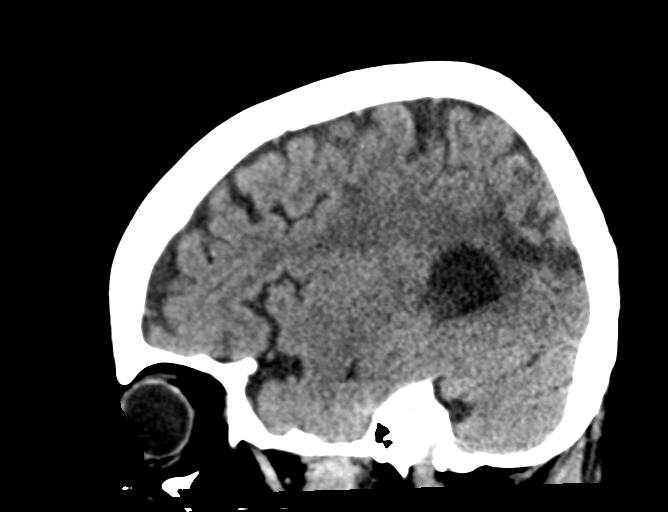
[im 32/63  brain]
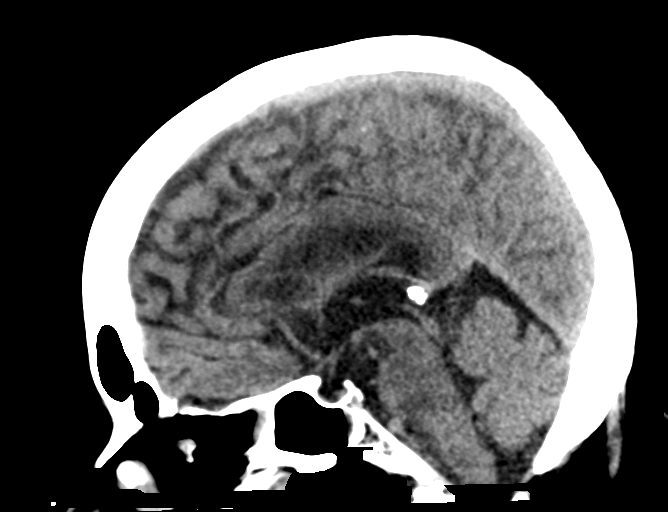
[im 42/63  brain]
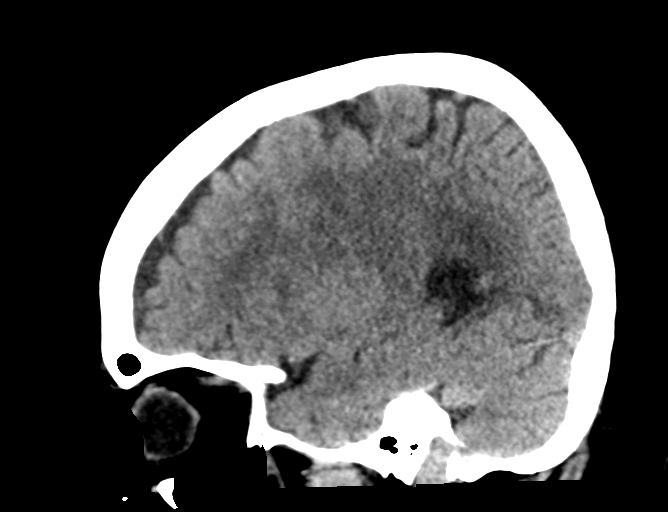

[16 of 47 positions shown; findings below may reference images not displayed]

FINDINGS: Brain: Since the prior exam, hypodensity has developed in the left
occipital region, primarily in the subcortical white matter,
consistent with acute to subacute infarct.

Areas of chronic ischemic change are again seen throughout the
periventricular white matter, thalami, and right temporal occipital
region.

No acute hemorrhage. Lateral ventricles and remaining midline
structures are unremarkable. No acute extra-axial fluid collections.
No mass effect.

Vascular: No hyperdense vessel or unexpected calcification.

Skull: Normal. Negative for fracture or focal lesion.

Sinuses/Orbits: Chronic mucosal thickening within the left maxillary
sinus, right sphenoid sinus, and ethmoid air cells.

Other: None.
IMPRESSION: 1. Acute to subacute left occipital cortical infarct, new since
previous studies.
2. Stable chronic small-vessel ischemic changes throughout the white
matter, thalami, and right temporal occipital region.
3. No acute hemorrhage.

These results were called by telephone at the time of interpretation
on [DATE] at [DATE] to provider LEVAR , who
verbally acknowledged these results.

## 2020-03-08 IMAGING — CR DG CHEST 2V
2 series · 2 of 2 positions shown · non-contrast
Comparison: [DATE]

CLINICAL DATA: Blurred vision, dizziness, left occipital infarct

EXAM:
CHEST - 2 VIEW

[chest lat]
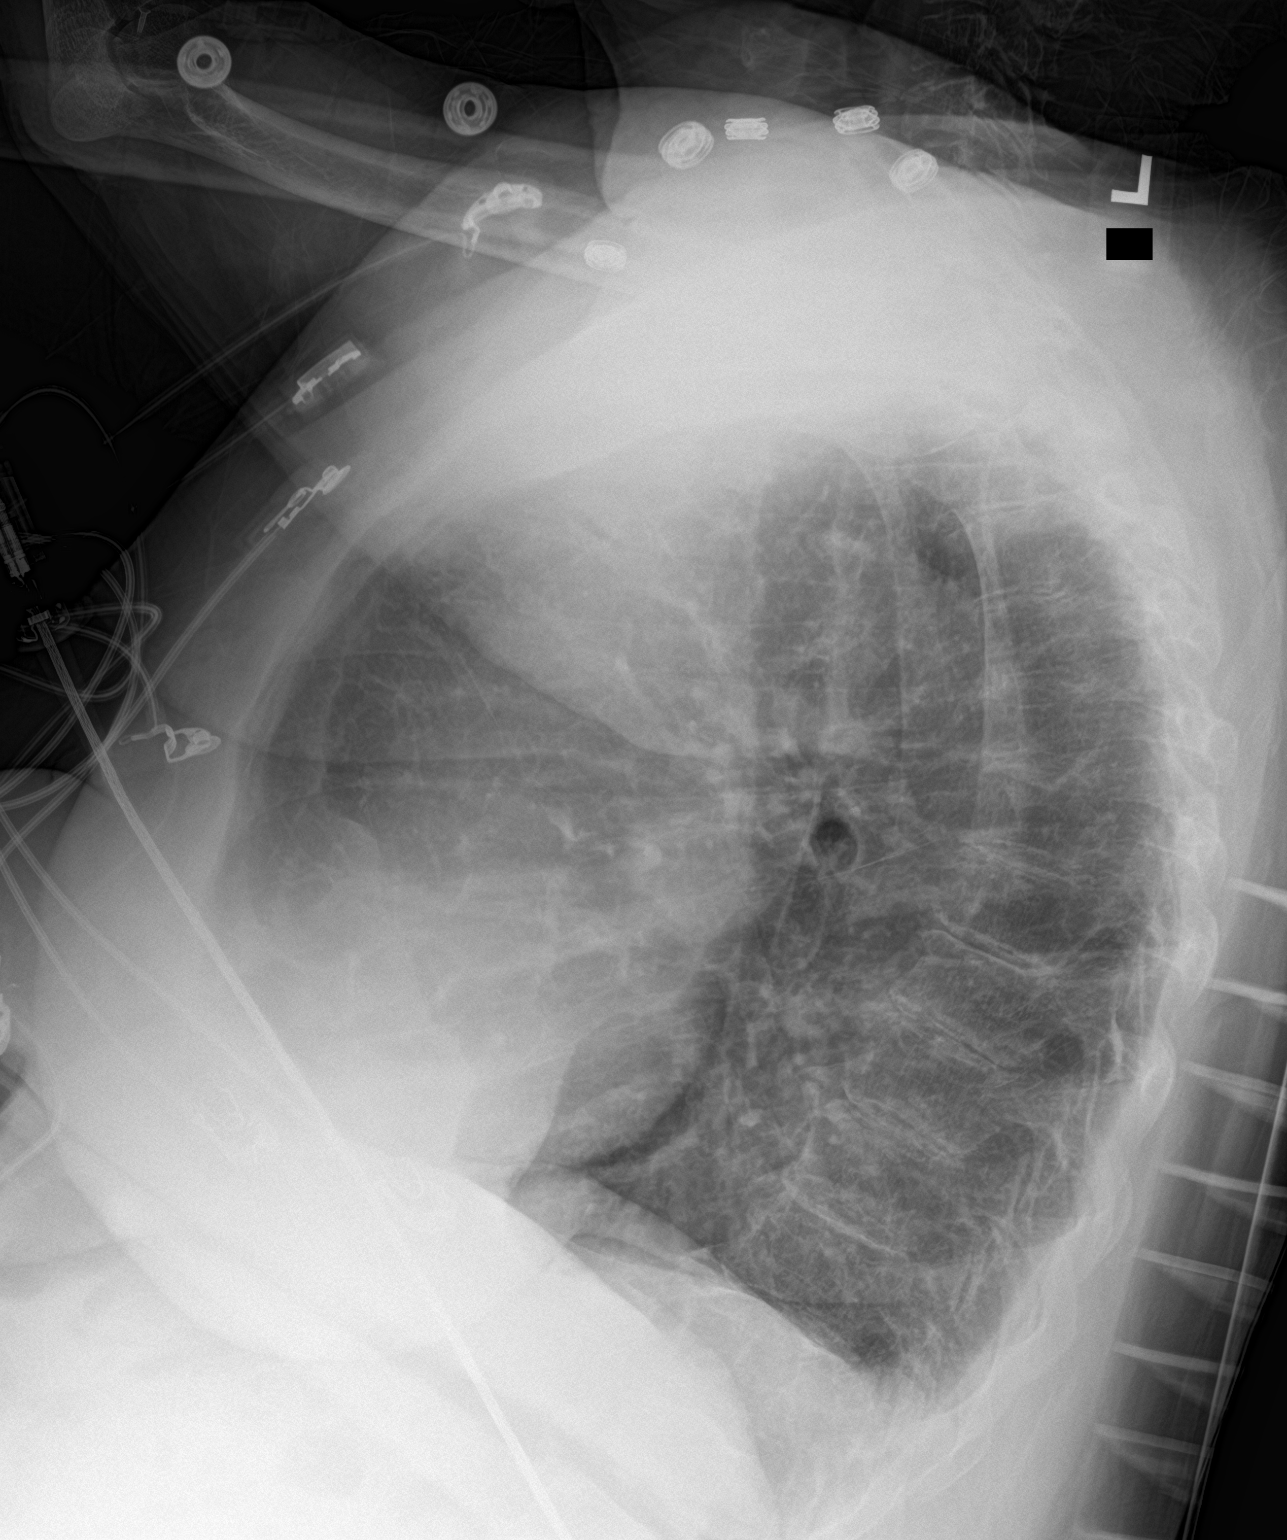

[chest ap]
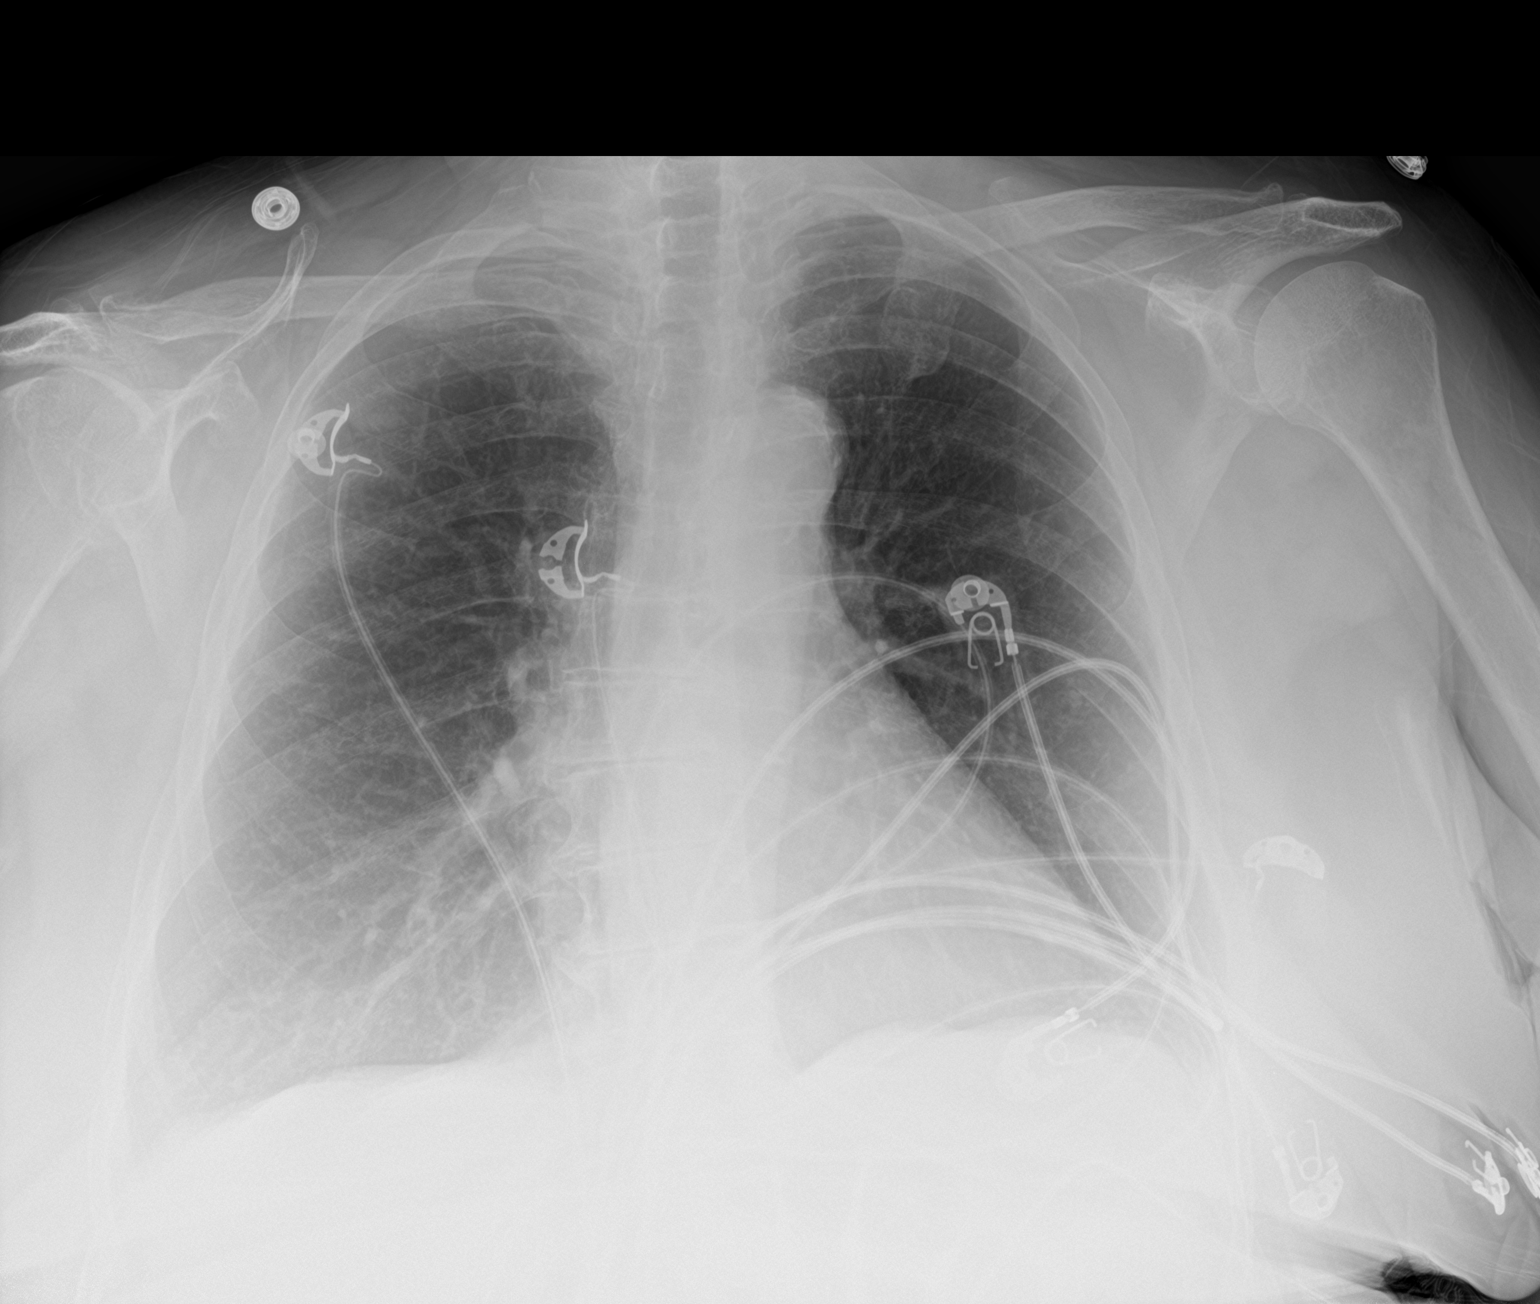

[2 of 2 positions shown; findings below may reference images not displayed]

FINDINGS: The heart size and mediastinal contours are within normal limits.
Both lungs are clear. The visualized skeletal structures are
unremarkable.
IMPRESSION: No active cardiopulmonary disease.

## 2020-03-08 MED ORDER — ENOXAPARIN SODIUM 40 MG/0.4ML ~~LOC~~ SOLN
40.0000 mg | SUBCUTANEOUS | Status: DC
  Administered 2020-03-08 – 2020-03-12 (×5): 40 mg via SUBCUTANEOUS
  Filled 2020-03-08 (×5): qty 0.4

## 2020-03-08 MED ORDER — SODIUM CHLORIDE 0.9 % IV SOLN
1.0000 g | Freq: Once | INTRAVENOUS | Status: AC
  Administered 2020-03-08: 1 g via INTRAVENOUS
  Filled 2020-03-08: qty 10

## 2020-03-08 MED ORDER — LORAZEPAM 2 MG/ML IJ SOLN
INTRAMUSCULAR | Status: AC
  Administered 2020-03-08: 1 mg via INTRAVENOUS
  Filled 2020-03-08: qty 1

## 2020-03-08 MED ORDER — ACETAMINOPHEN 160 MG/5ML PO SOLN: 650.0000 mg | ORAL | Status: DC | PRN

## 2020-03-08 MED ORDER — STROKE: EARLY STAGES OF RECOVERY BOOK
Freq: Once | Status: AC
  Filled 2020-03-08: qty 1

## 2020-03-08 MED ORDER — ACETAMINOPHEN 650 MG RE SUPP: 650.0000 mg | RECTAL | Status: DC | PRN

## 2020-03-08 MED ORDER — ACETAMINOPHEN 325 MG PO TABS
650.0000 mg | ORAL_TABLET | ORAL | Status: DC | PRN
  Administered 2020-03-10 – 2020-03-13 (×7): 650 mg via ORAL
  Filled 2020-03-08 (×7): qty 2

## 2020-03-08 MED ORDER — SODIUM CHLORIDE 0.9 % IV SOLN
1.0000 g | INTRAVENOUS | Status: DC
  Administered 2020-03-09: 1 g via INTRAVENOUS
  Filled 2020-03-08: qty 10

## 2020-03-08 MED ORDER — ASPIRIN 325 MG PO TABS
325.0000 mg | ORAL_TABLET | Freq: Once | ORAL | Status: AC
  Administered 2020-03-08: 325 mg via ORAL
  Filled 2020-03-08: qty 1

## 2020-03-08 MED ORDER — LORAZEPAM 2 MG/ML IJ SOLN: 1.0000 mg | Freq: Once | INTRAMUSCULAR | Status: AC

## 2020-03-08 MED ORDER — LORAZEPAM 1 MG PO TABS: 1.0000 mg | ORAL_TABLET | Freq: Once | ORAL | Status: DC | PRN

## 2020-03-08 MED ORDER — ROPINIROLE HCL 1 MG PO TABS
3.0000 mg | ORAL_TABLET | Freq: Every evening | ORAL | Status: DC | PRN
  Administered 2020-03-08 – 2020-03-11 (×5): 3 mg via ORAL
  Filled 2020-03-08 (×6): qty 3

## 2020-03-08 NOTE — ED Notes (Signed)
Neuro made aware of issue with obtaining MRI, no further orders at this time. Neuro at bedside.

## 2020-03-08 NOTE — ED Notes (Signed)
Patient transported to X-ray 

## 2020-03-08 NOTE — ED Provider Notes (Signed)
[facility] EMERGENCY DEPARTMENT Provider Note   CSN: [id number] Arrival date & time: 03/08/20  1637     History Chief Complaint  Patient presents with  . Blurred Vision  . Dizziness    [name] Good is a 69 y.o. female who presents with concern for loss of vision, blurry vision in the right eye, as well as feeling of confusion since past Saturday (x 6 days).   Patient states that she had an argument with her husband on Saturday night, had a headache after this, laid down to rest and headache resolved.  It was after that headache that she noted vision changes, feeling confused.  She denies weakness in her extremities, denies difficulty ambulating independently.  Denies numbness or tingling in extremities, denies difficulty with speech, denies facial droop, denies dizziness.  Patient's daughter Cristina is at the bedside, providing much of the collateral history.  She states that she is spoken with her mother over the phone since Saturday, feels that she is getting progressively more confused and delayed in her cognition.  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  She states at that time she had similar symptoms of blurry vision in her right eye, slurring her words, feeling confused, had left-sided weakness.  She did not receive TPA at that time, she was outside window.  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  Patient states that over the months following her stroke in June, her symptoms resolved.  She no longer had left-sided weakness, denies difficulty speaking, reports resolution of her blurry vision at that time.  Echocardiogram in June 2021 revealed left ventricular thrombus, LVEF 60-65%.   I personally reviewed this patient's medical records.  She has history of prior CVA, restless leg syndrome, hypertension, depression,  arthritis, MI, GERD, anxiety.   HPI     Past Medical History:  Diagnosis Date  . Anxiety   . Arthritis   . Depression   . GERD (gastroesophageal reflux disease)   . Myocardial infarction (HCC) 89   no visits to cardiac dr(thomas kelly) since 89  . Stroke (HCC)   . Wound dehiscence    lumbar    Patient Active Problem List   Diagnosis Date Noted  . Acute embolic stroke (HCC) 03/08/2020  . 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 Acute lower UTI 03/08/2020  . Right thalamic infarction (HCC) 10/05/2019  . Severe dehydration 10/02/2019  . Grief reaction 10/02/2019  . RLS (restless legs syndrome) 10/02/2019  . Acute CVA (cerebrovascular accident) (HCC) 10/01/2019  . Hypokalemia 10/01/2019  . Cellulitis of left upper extremity 10/18/2017  . Arthritis 10/18/2017  . Septic shock (HCC) 10/16/2017  . Former smoker 11/13/2016  . History of restless legs syndrome 11/13/2016  . Nontraumatic incomplete tear of right rotator cuff 08/18/2016  . Autoimmune disease (HCC) 08/12/2016  . Primary osteoarthritis of both knees 08/12/2016  . Primary osteoarthritis of both feet 08/12/2016  . Obesity (BMI 35.0-39.9 without comorbidity) 08/12/2016  . Osteoarthritis of right hip 10/25/2015  . 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 S/P lumbar laminectomy 06/14/2015    Past Surgical History:  Procedure Laterality Date  . APPENDECTOMY     17 yrs  . BACK SURGERY    . BREAST SURGERY Left    cyst  . CARDIAC CATHETERIZATION  89  . CHOLECYSTECTOMY  69 yrs old  . LUMBAR LAMINECTOMY/DECOMPRESSION MICRODISCECTOMY N/A 06/14/2015   Procedure: Lumbar Three-Four,Lumbar Four-Five, Lumbar Five-Sacral One Laminectomy;  Surgeon: Tia Alert, MD;  Location: [facility];  Service: Neurosurgery;  Laterality: N/A;  . LUMBAR WOUND DEBRIDEMENT N/A 07/24/2015   Procedure: lumbar wound revision;  Surgeon: Tia Alert, MD;  Location: [facility];  Service: Neurosurgery;  Laterality:  N/A;  . TOTAL HIP ARTHROPLASTY Right 10/25/2015   Procedure: RIGHT TOTAL HIP ARTHROPLASTY ANTERIOR APPROACH;  Surgeon: Kathryne Hitch, MD;  Location: [facility];  Service: Orthopedics;  Laterality: Right;  . TUBAL LIGATION       OB History   No obstetric history on file.     Family History  Problem Relation Age of Onset  . Stroke Mother   . Clotting disorder Neg Hx     Social History   Tobacco Use  . Smoking status: Former Smoker    Packs/day: 2.00    Years: 40.00    Pack years: 80.00    Types: Cigarettes    Quit date: 04/10/2013    Years since quitting: 6.9  . Smokeless tobacco: Never Used  Vaping Use  . Vaping Use: Every day  . Start date: 04/11/2013  . Substances: Nicotine, Nicotine-salt  Substance Use Topics  . Alcohol use: Not Currently    Comment: rarely  . Drug use: No    Home Medications Prior to Admission medications   Medication Sig Start Date End Date Taking? Authorizing Provider  acetaminophen (TYLENOL) 325 MG tablet Take 2 tablets (650 mg total) by mouth every 4 (four) hours as needed for mild pain (or temp > 37.5 C (99.5 F)). 10/19/19   Angiulli, Mcarthur Rossetti, PA-C  apixaban (ELIQUIS) 5 MG TABS tablet Take 1 tablet (5 mg total) by mouth 2 (two) times daily. 10/19/19   Angiulli, Mcarthur Rossetti, PA-C  atorvastatin (LIPITOR) 80 MG tablet Take 1 tablet (80 mg total) by mouth daily. 10/19/19   Angiulli, Mcarthur Rossetti, PA-C  calcium carbonate (TUMS EX) 750 MG chewable tablet Chew 1 tablet by mouth 2 (two) times daily as needed for heartburn.    [provider]  furosemide (LASIX) 40 MG tablet Take 40 mg by mouth.    [provider]  gabapentin (NEURONTIN) 400 MG capsule Take 1 capsule (400 mg total) by mouth 3 (three) times daily. 10/19/19   Angiulli, Mcarthur Rossetti, PA-C  magnesium oxide (MAG-OX) 400 MG tablet Take 1 tablet (400 mg total) by mouth daily. 10/19/19   Angiulli, Mcarthur Rossetti, PA-C  oxyCODONE-acetaminophen (PERCOCET/ROXICET) 5-325 MG tablet Take 1 tablet by  mouth every 6 (six) hours as needed for moderate pain or severe pain. 10/19/19   Angiulli, Mcarthur Rossetti, PA-C  rOPINIRole (REQUIP) 3 MG tablet Take 1 tablet (3 mg total) by mouth at bedtime as needed (restless leg). 10/19/19   Angiulli, Mcarthur Rossetti, PA-C  tetrahydrozoline 0.05 % ophthalmic solution Place 1 drop into both eyes daily as needed (Dry eyes).    [provider]    Allergies    Amoxicillin  Review of Systems   Review of Systems  Constitutional: Positive for fatigue.  HENT: Negative.   Eyes: Positive for visual disturbance.       Cannot see right field of vision.  Respiratory: Negative for cough, chest tightness, shortness of breath and wheezing.   Cardiovascular: Negative for palpitations and leg swelling.  Gastrointestinal: Negative for abdominal pain, constipation, nausea and vomiting.  Genitourinary: Negative for dysuria, hematuria and urgency.  Musculoskeletal: Positive for arthralgias.       Osteoarthritis, chronic.  Skin: Negative.   Allergic/Immunologic: Negative.   Neurological: Negative for dizziness, facial asymmetry, speech difficulty, weakness, light-headedness and headaches.  Hematological: Bruises/bleeds easily.       On Eliquis for left ventricular thrombus.  Psychiatric/Behavioral: Positive for confusion and decreased concentration.    Physical Exam Updated Vital Signs BP (!) 131/119   Pulse 86   Temp 98.6 F (37 C) (Oral)   Resp 18   Ht 5\' 3"  (1.6 m)   Wt 78.5 kg   SpO2 99%   BMI 30.65 kg/m   Physical Exam Vitals and nursing note reviewed.  HENT:     Head: Normocephalic and atraumatic.     Nose: Nose normal.     Mouth/Throat:     Mouth: Mucous membranes are moist.     Pharynx: Oropharynx is clear. Uvula midline. No oropharyngeal exudate or posterior oropharyngeal erythema.     Tonsils: No tonsillar exudate.  Eyes:     General:        Right eye: No discharge.        Left eye: No discharge.     Extraocular Movements: Extraocular movements  intact.     Conjunctiva/sclera: Conjunctivae normal.     Pupils: Pupils are equal, round, and reactive to light.     Comments: Right-sided homonymous hemianopia.  Cardiovascular:     Rate and Rhythm: Normal rate and regular rhythm.     Pulses: Normal pulses.          Radial pulses are 2+ on the right side and 2+ on the left side.     Heart sounds: Normal heart sounds. No murmur heard.   Pulmonary:     Effort: Pulmonary effort is normal. Prolonged expiration present. No respiratory distress.     Breath sounds: Normal breath sounds. No wheezing or rales.  Abdominal:     General: There is no distension.     Palpations: Abdomen is soft.     Tenderness: There is no abdominal tenderness. There is no guarding or rebound.  Musculoskeletal:        General: No deformity. Normal range of motion.     Cervical back: Neck supple. No rigidity or tenderness.     Right lower leg: No edema.     Left lower leg: No edema.  Lymphadenopathy:     Cervical: No cervical adenopathy.  Skin:    General: Skin is warm and dry.     Capillary Refill: Capillary refill takes less than 2 seconds.  Neurological:     Mental Status: She is alert and oriented to person, place, and time.     Cranial Nerves: Cranial nerves are intact.     Sensory: Sensation is intact.     Motor: Motor function is intact.     Coordination: Coordination abnormal. Finger-Nose-Finger Test abnormal.     Deep Tendon Reflexes:     Reflex Scores:      Bicep reflexes are 1+ on the right side and 1+ on the left side.      Patellar reflexes are 2+ on the right side and 2+ on the left side.    Comments: Abnormal finger-to-nose due to right-sided homonymous hemianopia, delay in fine motor coordination with finger tapping  Psychiatric:        Mood and Affect: Affect is flat.        Speech: Speech normal.        Cognition and Memory: Cognition  is impaired.     Comments: Delay in cognition, requires repetition of commands with more than one step  in order to perform them     ED Results / Procedures / Treatments   Labs (all labs ordered are listed, but only abnormal results are displayed) Labs Reviewed  URINALYSIS, ROUTINE W REFLEX MICROSCOPIC - Abnormal; Notable for the following components:      Result Value   Specific Gravity, Urine 1.004 (*)    Hgb urine dipstick SMALL (*)    Nitrite POSITIVE (*)    Leukocytes,Ua MODERATE (*)    Bacteria, UA MANY (*)    All other components within normal limits  RESPIRATORY PANEL BY RT PCR (FLU A&B, COVID)  URINE CULTURE  ETHANOL  PROTIME-INR  APTT  CBC  DIFFERENTIAL  COMPREHENSIVE METABOLIC PANEL  RAPID URINE DRUG SCREEN, HOSP PERFORMED  HIV ANTIBODY (ROUTINE TESTING W REFLEX)  HEMOGLOBIN A1C  LIPID PANEL    EKG EKG Interpretation  Date/Time:  Friday March 08 2020 16:40:29 EST Ventricular Rate:  87 PR Interval:    QRS Duration: 78 QT Interval:  375 QTC Calculation: 452 R Axis:   46 Text Interpretation: Sinus rhythm Low voltage, precordial leads Confirmed by Tilden Fossa [id number]) on 03/08/2020 4:43:16 PM   Radiology DG Chest 2 View  Result Date: 03/08/2020 CLINICAL DATA:  Blurred vision, dizziness, left occipital infarct EXAM: CHEST - 2 VIEW COMPARISON:  10/16/2017 FINDINGS: The heart size and mediastinal contours are within normal limits. Both lungs are clear. The visualized skeletal structures are unremarkable. IMPRESSION: No active cardiopulmonary disease. Electronically Signed   By: Sharlet Salina M.D.   On: 03/08/2020 21:13   CT HEAD WO CONTRAST  Result Date: 03/08/2020 CLINICAL DATA:  Blurred vision, dizziness EXAM: CT HEAD WITHOUT CONTRAST TECHNIQUE: Contiguous axial images were obtained from the base of the skull through the vertex without intravenous contrast. COMPARISON:  10/01/2019 FINDINGS: Brain: Since the prior exam, hypodensity has developed in the left occipital region, primarily in the subcortical white matter, consistent with acute to subacute  infarct. Areas of chronic ischemic change are again seen throughout the periventricular white matter, thalami, and right temporal occipital region. No acute hemorrhage. Lateral ventricles and remaining midline structures are unremarkable. No acute extra-axial fluid collections. No mass effect. Vascular: No hyperdense vessel or unexpected calcification. Skull: Normal. Negative for fracture or focal lesion. Sinuses/Orbits: Chronic mucosal thickening within the left maxillary sinus, right sphenoid sinus, and ethmoid air cells. Other: None. IMPRESSION: 1. Acute to subacute left occipital cortical infarct, new since previous studies. 2. Stable chronic small-vessel ischemic changes throughout the white matter, thalami, and right temporal occipital region. 3. No acute hemorrhage. These results were called by telephone at the time of interpretation on 03/08/2020 at 7:13pm to provider [facility] , who verbally acknowledged these results. Electronically Signed   By: Sharlet Salina M.D.   On: 03/08/2020 19:16    Procedures Procedures (including critical care time)  Medications Ordered in ED Medications  cefTRIAXone (ROCEPHIN) 1 g in sodium chloride 0.9 % 100 mL IVPB (has no administration in time range)  rOPINIRole (REQUIP) tablet 3 mg (3 mg Oral Given 03/08/20 2225)  acetaminophen (TYLENOL) tablet 650 mg (has no administration in time range)    Or  acetaminophen (TYLENOL) 160 MG/5ML solution 650 mg (has no administration in time range)    Or  acetaminophen (TYLENOL) suppository 650 mg (has no administration in time range)  enoxaparin (LOVENOX) injection 40 mg (40 mg Subcutaneous Given 03/08/20 2227)  cefTRIAXone (ROCEPHIN) 1 g in sodium chloride 0.9 % 100 mL IVPB (0 g Intravenous Stopped 03/08/20 2012)  aspirin tablet 325 mg (325 mg Oral Given 03/08/20 2227)   stroke: mapping our early stages of recovery book ( Does not apply Given 03/08/20 2226)  LORazepam (ATIVAN) injection 1 mg (1 mg Intravenous  Given 03/08/20 2129)    ED Course  I have reviewed the triage vital signs and the nursing notes.  Pertinent labs & imaging results that were available during my care of the patient were reviewed by me and considered in my medical decision making (see chart for details).    MDM Rules/Calculators/A&P                         69 year old patient with history of CVA, right thalamic infarct in June 2021, likely secondary to large left ventricular thrombus, now on Eliquis.  She presents with 6 days of progressively worsening right-sided loss of vision, delay in her cognition.   Patient mildly present to the intake 140/99, otherwise vital signs are normal.  Physical exam concerning for right-sided homonymous hemianopia, delay in cognition.  Patient is outside window for TPA administration.  CT, CMP, PT/INR, UA, ethanol, urine culture, respiratory pathogen panel, CT head without contrast, chest x-ray, EKG ordered.  EKG sinus rhythm no STEMI.  Chest xray Negative for acute cardiopulmonary disease.  CBC, CMP unremarkable.  PT/INR normal.  Respiratory pathogen panel negative for COVID-19/influenza A/B.  UDS negative.  UA significant for infection, small hemoglobin, positive nitrates, moderate leukocytes, many bacteria.  Urine culture pending.  CT of the head without contrast with acute to subacute left occipital cortical infarct new since previous studies. Stable chronic small vessel ischemic changes of the white matter, thalami, right temporal occipital region.  No acute hemorrhage.  Critical result called by radiologist to attending physician Dr. Madilyn Hookees.  Attending physician neurologist, recommended MRI of the brain, ultrasound carotid, MR angiography head, and admission for completion of stroke work-up.    Attending physician consulted hospitalist Dr. Julian ReilGardner, who was agreeable to admitting patient to his service.  Appreciate his collaboration care of this patient.    Attending physician  spoke with patient and her daughter who is at the bedside.  They voiced understanding of the medical evaluation and treatment plan.  Each of their questions were answered to their expressed satisfaction.  Final Clinical Impression(s) / ED Diagnoses Final diagnoses:  Acute CVA (cerebrovascular accident) [facility](HCC)    Rx / DC Orders ED Discharge Orders    None       Paris LoreSponseller, Athena Baltz R, PA-C 03/09/20 0230    Tilden Fossaees, Elizabeth, MD 03/09/20 1410

## 2020-03-08 NOTE — Progress Notes (Signed)
Pt given Ativan however she continued to move her head and legs. Unable to hold still for MRI. RN and ordering aware.

## 2020-03-08 NOTE — ED Triage Notes (Signed)
PT brought in by [facility] w/ complaints of blurred vision and feeling dizzy. Pt has a hx of a prior stroke in June of 2021 that left her with no deficits. From the stroke pt has new Rx's for blood thinner, BP and cholesterol meds. On Sunday pt was at a funeral service and started feeling ill. Pt had a headache that lasted 5 minutes and ceased by laying down. Ever since the headache pt has been feeling dizzy and has had blurry vision.  VS w/ EMS: BP-144/80  HR-98 Resp- 16 CBG- 129

## 2020-03-08 NOTE — H&P (Signed)
History and Physical    [name] Good:[id number] DOB: 09/01/1950 DOA: 03/08/2020  PCP: Aida Puffer, MD  Patient coming from: Home  I have personally briefly reviewed patient's old medical records in [facility]  Chief Complaint: Stroke  HPI: [name] Good is a 69 y.o. 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  Started on Eliquis.  LV thrombus still present on repeat office visit 3 months later per daughter.  Pt reports compliance with eliquis.  Pt in to ED for confusion / AMS onset on Past Sat (6 days).  Loss of vision / blurry vision to the R side.  Had argument with husband on Sat night, headache onset after this.  Laid down to rest and headache resolved, but after this she noticed vision changes and felt confused.  No extremity weakness, no numbness nor tingling in extremities, no speech difficulty.  Daughter reports increasing confusion during phone conversations since Sat.   ED Course: Subacute L occipital stroke on CT scan.  Also has UTI on UA.   Review of Systems: As per HPI, otherwise all review of systems negative.  Past Medical History:  Diagnosis Date  . Anxiety   . Arthritis   . Depression   . GERD (gastroesophageal reflux disease)   . Myocardial infarction (HCC) 91   no visits to cardiac dr(thomas kelly) since 92  . Stroke (HCC)   . Wound dehiscence    lumbar    Past Surgical History:  Procedure Laterality Date  . APPENDECTOMY     17 yrs  . BACK SURGERY    . BREAST SURGERY Left    cyst  . CARDIAC CATHETERIZATION  91  . CHOLECYSTECTOMY     69 yrs old  . LUMBAR LAMINECTOMY/DECOMPRESSION MICRODISCECTOMY N/A 06/14/2015   Procedure: Lumbar Three-Four,Lumbar Four-Five, Lumbar Five-Sacral One Laminectomy;  Surgeon: Tia Alert, MD;  Location: [facility];  Service: Neurosurgery;  Laterality: N/A;  . LUMBAR WOUND  DEBRIDEMENT N/A 07/24/2015   Procedure: lumbar wound revision;  Surgeon: Tia Alert, MD;  Location: [facility];  Service: Neurosurgery;  Laterality: N/A;  . TOTAL HIP ARTHROPLASTY Right 10/25/2015   Procedure: RIGHT TOTAL HIP ARTHROPLASTY ANTERIOR APPROACH;  Surgeon: Kathryne Hitch, MD;  Location: [facility];  Service: Orthopedics;  Laterality: Right;  . TUBAL LIGATION       reports that she quit smoking about 6 years ago. Her smoking use included cigarettes. She has a 80.00 pack-year smoking history. She has never used smokeless tobacco. She reports previous alcohol use. She reports that she does not use drugs.  Allergies  Allergen Reactions  . Amoxicillin Other (See Comments)    Tolerated Zosyn Has patient had a PCN reaction causing immediate rash, facial/tongue/throat swelling, SOB or lightheadedness with hypotension: No Has patient had a PCN reaction causing severe rash involving mucus membranes or skin necrosis: No Has patient had a PCN reaction that required hospitalization No Has patient had a PCN reaction occurring within the last 10 years: No If all of the above answers are "NO", then may proceed with Cephalosporin use.    Family History  Problem Relation Age of Onset  . Stroke Mother   . Clotting disorder Neg Hx      Prior to Admission medications   Medication Sig Start Date End Date Taking? Authorizing Provider  acetaminophen (TYLENOL) 325 MG tablet  Take 2 tablets (650 mg total) by mouth every 4 (four) hours as needed for mild pain (or temp > 37.5 C (99.5 F)). 10/19/19   Angiulli, Mcarthur Rossetti, PA-C  apixaban (ELIQUIS) 5 MG TABS tablet Take 1 tablet (5 mg total) by mouth 2 (two) times daily. 10/19/19   Angiulli, Mcarthur Rossetti, PA-C  atorvastatin (LIPITOR) 80 MG tablet Take 1 tablet (80 mg total) by mouth daily. 10/19/19   Angiulli, Mcarthur Rossetti, PA-C  calcium carbonate (TUMS EX) 750 MG chewable tablet Chew 1 tablet by mouth 2 (two) times daily as needed for heartburn.    [provider]  furosemide (LASIX) 40 MG tablet Take 40 mg by mouth.    [provider]  gabapentin (NEURONTIN) 400 MG capsule Take 1 capsule (400 mg total) by mouth 3 (three) times daily. 10/19/19   Angiulli, Mcarthur Rossetti, PA-C  magnesium oxide (MAG-OX) 400 MG tablet Take 1 tablet (400 mg total) by mouth daily. 10/19/19   Angiulli, Mcarthur Rossetti, PA-C  oxyCODONE-acetaminophen (PERCOCET/ROXICET) 5-325 MG tablet Take 1 tablet by mouth every 6 (six) hours as needed for moderate pain or severe pain. 10/19/19   Angiulli, Mcarthur Rossetti, PA-C  rOPINIRole (REQUIP) 3 MG tablet Take 1 tablet (3 mg total) by mouth at bedtime as needed (restless leg). 10/19/19   Angiulli, Mcarthur Rossetti, PA-C  tetrahydrozoline 0.05 % ophthalmic solution Place 1 drop into both eyes daily as needed (Dry eyes).    [provider]    Physical Exam: Vitals:   03/08/20 1900 03/08/20 1915 03/08/20 1939 03/08/20 2015  BP: (!) 142/56 132/70 132/70 (!) 129/94  Pulse: 94 96 92 96  Resp: (!) 22 18 (!) 22 13  Temp:      TempSrc:      SpO2: 99% 98% 98% 99%  Weight:      Height:        Constitutional: NAD, calm, comfortable Eyes: PERRL, lids and conjunctivae normal ENMT: Mucous membranes are moist. Posterior pharynx clear of any exudate or lesions.Normal dentition.  Neck: normal, supple, no masses, no thyromegaly Respiratory: clear to auscultation bilaterally, no wheezing, no crackles. Normal respiratory effort. No accessory muscle use.  Cardiovascular: Regular rate and rhythm, no murmurs / rubs / gallops. No extremity edema. 2+ pedal pulses. No carotid bruits.  Abdomen: no tenderness, no masses palpated. No hepatosplenomegaly. Bowel sounds positive.  Musculoskeletal: no clubbing / cyanosis. No joint deformity upper and lower extremities. Good ROM, no contractures. Normal muscle tone.  Skin: no rashes, lesions, ulcers. No induration Neurologic: Cognition delayed, R sided Homonymous hemianopsia. Psychiatric: Anxious   Labs on  Admission: I have personally reviewed following labs and imaging studies  CBC: Recent Labs  Lab 03/08/20 1713  WBC 8.5  NEUTROABS 4.9  HGB 14.6  HCT 44.2  MCV 92.7  PLT 244   Basic Metabolic Panel: Recent Labs  Lab 03/08/20 1713  NA 141  K 3.9  CL 107  CO2 24  GLUCOSE 94  BUN 10  CREATININE 0.80  CALCIUM 9.7   GFR: Estimated Creatinine Clearance: 64.9 mL/min (by C-G formula based on SCr of 0.8 mg/dL). Liver Function Tests: Recent Labs  Lab 03/08/20 1713  AST 23  ALT 21  ALKPHOS 96  BILITOT 0.8  PROT 7.1  ALBUMIN 3.9   No results for input(s): LIPASE, AMYLASE in the last 168 hours. No results for input(s): AMMONIA in the last 168 hours. Coagulation Profile: Recent Labs  Lab 03/08/20 1713  INR 1.2   Cardiac Enzymes:  No results for input(s): CKTOTAL, CKMB, CKMBINDEX, TROPONINI in the last 168 hours. BNP (last 3 results) No results for input(s): PROBNP in the last 8760 hours. HbA1C: No results for input(s): HGBA1C in the last 72 hours. CBG: No results for input(s): GLUCAP in the last 168 hours. Lipid Profile: No results for input(s): CHOL, HDL, LDLCALC, TRIG, CHOLHDL, LDLDIRECT in the last 72 hours. Thyroid Function Tests: No results for input(s): TSH, T4TOTAL, FREET4, T3FREE, THYROIDAB in the last 72 hours. Anemia Panel: No results for input(s): VITAMINB12, FOLATE, FERRITIN, TIBC, IRON, RETICCTPCT in the last 72 hours. Urine analysis:    Component Value Date/Time   COLORURINE YELLOW 03/08/2020 1755   APPEARANCEUR CLEAR 03/08/2020 1755   LABSPEC 1.004 (L) 03/08/2020 1755   PHURINE 7.0 03/08/2020 1755   GLUCOSEU NEGATIVE 03/08/2020 1755   HGBUR SMALL (A) 03/08/2020 1755   BILIRUBINUR NEGATIVE 03/08/2020 1755   KETONESUR NEGATIVE 03/08/2020 1755   PROTEINUR NEGATIVE 03/08/2020 1755   NITRITE POSITIVE (A) 03/08/2020 1755   LEUKOCYTESUR MODERATE (A) 03/08/2020 1755    Radiological Exams on Admission: DG Chest 2 View  Result Date:  03/08/2020 CLINICAL DATA:  Blurred vision, dizziness, left occipital infarct EXAM: CHEST - 2 VIEW COMPARISON:  10/16/2017 FINDINGS: The heart size and mediastinal contours are within normal limits. Both lungs are clear. The visualized skeletal structures are unremarkable. IMPRESSION: No active cardiopulmonary disease. Electronically Signed   By: Sharlet SalinaMichael  Brown M.D.   On: 03/08/2020 21:13   CT HEAD WO CONTRAST  Result Date: 03/08/2020 CLINICAL DATA:  Blurred vision, dizziness EXAM: CT HEAD WITHOUT CONTRAST TECHNIQUE: Contiguous axial images were obtained from the base of the skull through the vertex without intravenous contrast. COMPARISON:  10/01/2019 FINDINGS: Brain: Since the prior exam, hypodensity has developed in the left occipital region, primarily in the subcortical white matter, consistent with acute to subacute infarct. Areas of chronic ischemic change are again seen throughout the periventricular white matter, thalami, and right temporal occipital region. No acute hemorrhage. Lateral ventricles and remaining midline structures are unremarkable. No acute extra-axial fluid collections. No mass effect. Vascular: No hyperdense vessel or unexpected calcification. Skull: Normal. Negative for fracture or focal lesion. Sinuses/Orbits: Chronic mucosal thickening within the left maxillary sinus, right sphenoid sinus, and ethmoid air cells. Other: None. IMPRESSION: 1. Acute to subacute left occipital cortical infarct, new since previous studies. 2. Stable chronic small-vessel ischemic changes throughout the white matter, thalami, and right temporal occipital region. 3. No acute hemorrhage. These results were called by telephone at the time of interpretation on 03/08/2020 at 7:13pm to provider [facility]BE[facility] SPON[facility]LLER , who verbally acknowledged these results. Electronically Signed   By: Sharlet SalinaMichael  Brown M.D.   On: 03/08/2020 19:16    EKG: Independently reviewed.  Assessment/Plan Principal Problem:   Acute  embolic stroke [facility](HCC) Active Problems:   History of restless legs syndrome   Left ventricular apical thrombus    1. Acute embolic stroke - 1. L occipital stroke, suspect cardioembolic source (setting of known LV apical thrombus), picture concerning for failed eliquis. 2. Neuro consult 3. Stroke pathway 4. MRI/MRA 5. 2d echo 6. Carotid dopplers 7. PT/OT/SLP 8. Per neurologist: 1. ASA 325 now 2. Hold eliquis for now 3. Will know more about when to resume eliquis based on MRI brain 9. Tele monitor 2. RLS - 1. Cont requip 3. UTI - 1. Rocephin 2. UCx pending  DVT prophylaxis: Lovenox Code Status: Full Family Communication: Daughter at bedside Disposition Plan: TBD, may need CIR again Consults called: Dr.  Khaliqdina Admission status: Admit to inpatient  Severity of Illness: The appropriate patient status for this patient is INPATIENT. Inpatient status is judged to be reasonable and necessary in order to provide the required intensity of service to ensure the patient's safety. The patient's presenting symptoms, physical exam findings, and initial radiographic and laboratory data in the context of their chronic comorbidities is felt to place them at high risk for further clinical deterioration. Furthermore, it is not anticipated that the patient will be medically stable for discharge from the hospital within 2 midnights of admission. The following factors support the patient status of inpatient.   IP status due to acute ischemic stroke demonstrated on CT scan.  New neurologic deficits.   * I certify that at the point of admission it is my clinical judgment that the patient will require inpatient hospital care spanning beyond 2 midnights from the point of admission due to high intensity of service, high risk for further deterioration and high frequency of surveillance required.*    Dariella Gillihan M. DO [facility]  How to contact the [facility] Attending or Consulting provider 7A -  7P or covering provider during after hours 7P -7A, for this patient?  1. Check the care team in [facility] and look for a) attending/consulting [facility] provider listed and b) the [facility] team listed 2. Log into [email or web address]  Amion Physician Scheduling and messaging for groups and whole hospitals  On call and physician scheduling software for group practices, residents, hospitalists and other medical providers for call, clinic, rotation and shift schedules. OnCall [employer] is a hospital-wide system for scheduling doctors and paging doctors on call. EasyPlot is for scientific plotting and data analysis.  [email or web address]  and use [place]'s universal password to access. If you do not have the password, please contact the hospital operator.  3. Locate the [facility] provider you are looking for under [facility] and page to a number that you can be directly reached. 4. If you still have difficulty reaching the provider, please page the [facility] (Director on Call) for the Hospitalists listed on amion for assistance.  03/08/2020, 9:20 PM

## 2020-03-08 NOTE — ED Notes (Signed)
MRI called and said patient is still alert, moving head, moving legs, and crying. Unable to get good scan r/t same. Dr. Julian Reil made aware of same and gave instruction to bring patient back. Neuro will determine further action/need for sedation tomorrow. MRI made aware of same.

## 2020-03-08 NOTE — ED Notes (Signed)
Dr. Gardner at bedside with patient and daughter 

## 2020-03-08 NOTE — ED Notes (Signed)
Received report from [place], [place]. Patient is tearful due to RLS, awaiting pharmacy med verification for same. Patient reports no further change in vision. MAE. Patient updated on plan of care and verbalized understanding of same.

## 2020-03-08 NOTE — ED Notes (Signed)
Message sent to pharmacy regarding missing med dose for RLS med.

## 2020-03-08 NOTE — Consult Note (Addendum)
NEUROLOGY CONSULTATION NOTE   Date of service: March 08, 2020 Patient Name: [name] Good MRN:  [id number] DOB:  [birth year] Reason for consult: "L PCA stroke."  History of Present Illness  [name] Good is a 69 y.o. female with PMH significant for anxiety, Depression, GERD, MI, prior R occipital Stroke, hx of L apical thrombus on eliquis who presents with right sided vision loss x 6 days. Symptoms started on Saturday 03/02/20 and have been persistent since. She reports compliance to eliquis.  No arm or leg weakness, no numbness, no dysarthria, no aphasia, no facial droop.  NIHSS: 2 LKW: 03/02/20 MRS: 1 TPA: outside window Thrombectomy: outside window  ROS   Constitutional Denies weight loss, fever and chills.   HEENT + changes in vision but no problems with hearing.   Respiratory Denies SOB and cough.   CV Denies palpitations and CP   GI Denies abdominal pain, nausea, vomiting and diarrhea.   GU Denies dysuria and urinary frequency.   MSK Denies myalgia and joint pain.   Skin Denies rash and pruritus.  Neurological Denies headache and syncope.  Psychiatric Denies recent changes in mood. Denies anxiety and depression.   Past History   Past Medical History:  Diagnosis Date  . Anxiety   . Arthritis   . Depression   . GERD (gastroesophageal reflux disease)   . Myocardial infarction (HCC) 91   no visits to cardiac dr(thomas kelly) since 92  . Stroke (HCC)   . Wound dehiscence    lumbar   Past Surgical History:  Procedure Laterality Date  . APPENDECTOMY     17 yrs  . BACK SURGERY    . BREAST SURGERY Left    cyst  . CARDIAC CATHETERIZATION  91  . CHOLECYSTECTOMY     69 yrs old  . LUMBAR LAMINECTOMY/DECOMPRESSION MICRODISCECTOMY N/A 06/14/2015   Procedure: Lumbar Three-Four,Lumbar Four-Five, Lumbar Five-Sacral One Laminectomy;  Surgeon: Tia Alert, MD;  Location: [facility];  Service: Neurosurgery;  Laterality: N/A;  . LUMBAR WOUND DEBRIDEMENT N/A 07/24/2015    Procedure: lumbar wound revision;  Surgeon: Tia Alert, MD;  Location: [facility];  Service: Neurosurgery;  Laterality: N/A;  . TOTAL HIP ARTHROPLASTY Right 10/25/2015   Procedure: RIGHT TOTAL HIP ARTHROPLASTY ANTERIOR APPROACH;  Surgeon: Kathryne Hitch, MD;  Location: [facility];  Service: Orthopedics;  Laterality: Right;  . TUBAL LIGATION     No family history on file. Social History   Socioeconomic History  . Marital status: Married    Spouse name: Aayushi Solorzano  . Number of children: 4  . Years of education: GED  . Highest education level: GED or equivalent  Occupational History  . Not on file  Tobacco Use  . Smoking status: Former Smoker    Packs/day: 2.00    Years: 40.00    Pack years: 80.00    Types: Cigarettes    Quit date: 04/10/2013    Years since quitting: 6.9  . Smokeless tobacco: Never Used  Vaping Use  . Vaping Use: Every day  . Start date: 04/11/2013  . Substances: Nicotine, Nicotine-salt  Substance and Sexual Activity  . Alcohol use: Not Currently    Comment: rarely  . Drug use: No  . Sexual activity: Not Currently  Other Topics Concern  . Not on file  Social History Narrative  . Not on file   Social Determinants of Health   Financial Resource Strain:   . Difficulty of Paying Living Expenses: Not on  file  Food Insecurity:   . Worried About Programme researcher, broadcasting/film/video in the Last Year: Not on file  . Ran Out of Food in the Last Year: Not on file  Transportation Needs:   . Lack of Transportation (Medical): Not on file  . Lack of Transportation (Non-Medical): Not on file  Physical Activity:   . Days of Exercise per Week: Not on file  . Minutes of Exercise per Session: Not on file  Stress:   . Feeling of Stress : Not on file  Social Connections:   . Frequency of Communication with Friends and Family: Not on file  . Frequency of Social Gatherings with Friends and Family: Not on file  . Attends Religious Services: Not on file  . Active Member of Clubs  or Organizations: Not on file  . Attends Banker Meetings: Not on file  . Marital Status: Not on file   Allergies  Allergen Reactions  . Amoxicillin Other (See Comments)    Tolerated Zosyn Has patient had a PCN reaction causing immediate rash, facial/tongue/throat swelling, SOB or lightheadedness with hypotension: No Has patient had a PCN reaction causing severe rash involving mucus membranes or skin necrosis: No Has patient had a PCN reaction that required hospitalization No Has patient had a PCN reaction occurring within the last 10 years: No If all of the above answers are "NO", then may proceed with Cephalosporin use.    Medications  (Not in a hospital admission)    Vitals   Vitals:   03/08/20 1815 03/08/20 1830 03/08/20 1900 03/08/20 1939  BP: 136/77 127/72 (!) 142/56 132/70  Pulse: 92 96 94 92  Resp: 19 13 (!) 22 (!) 22  Temp:      TempSrc:      SpO2: 99% 98% 99% 98%  Weight:      Height:         Body mass index is 30.65 kg/m.  Physical Exam   General: Laying comfortably in bed; in no acute distress. HENT: Normal oropharynx and mucosa. Normal external appearance of ears and nose. Neck: Supple, no pain or tenderness CV: No JVD. No peripheral edema. Pulmonary: Symmetric Chest rise. Normal respiratory effort. Abdomen: Soft to touch, non-tender. Ext: No cyanosis, edema, or deformity Skin: No rash. Normal palpation of skin.  Musculoskeletal: Normal digits and nails by inspection. No clubbing.  Neurologic Examination  Mental status/Cognition: Alert, oriented to self, place, month and year, good attention. Speech/language: Fluent, comprehension intact, object naming intact, repetition intact. Cranial nerves:   CN II Pupils equal and reactive to light, R superior quadrant ghemianopsia.   CN III,IV,VI EOM intact, no gaze preference or deviation, no nystagmus   CN V normal sensation in V1, V2, and V3 segments bilaterally   CN VII no asymmetry, no  nasolabial fold flattening   CN VIII normal hearing to speech   CN IX & X normal palatal elevation, no uvular deviation   CN XI 5/5 head turn and 5/5 shoulder shrug bilaterally   CN XII midline tongue protrusion    Motor:  Muscle bulk: normal, tone normal, pronator drift none tremor none, does endorse restless legs. Mvmt Root Nerve  Muscle Right Left Comments  SA C5/6 Ax Deltoid 5 5   EF C5/6 Mc Biceps 5 5   EE C6/7/8 Rad Triceps 5 5   WF C6/7 Med FCR 5 5   WE C7/8 PIN ECU 5 5   F Ab C8/T1 U ADM/FDI 5 5   HF  L1/2/3 Fem Illopsoas 5 5   KE L2/3/4 Fem Quad 5 5   DF L4/5 D Peron Tib Ant 5 5   PF S1/2 Tibial Grc/Sol 5 5    Reflexes:  Right Left Comments  Pectoralis      Biceps (C5/6)     Brachioradialis (C5/6)      Triceps (C6/7)      Patellar (L3/4)      Achilles (S1)      Hoffman      Plantar     Jaw jerk    Sensation:  Light touch Intact throughout   Pin prick    Temperature    Vibration   Proprioception    Coordination/Complex Motor:  - Finger to Nose intact BL - Heel to shin unable to assess due to leg pain and RLS - Rapid alternating movement is normal - Gait: deferred  Labs   CBC:  Recent Labs  Lab 03/08/20 1713  WBC 8.5  NEUTROABS 4.9  HGB 14.6  HCT 44.2  MCV 92.7  PLT 244    Basic Metabolic Panel:  Lab Results  Component Value Date   NA 141 03/08/2020   K 3.9 03/08/2020   CO2 24 03/08/2020   GLUCOSE 94 03/08/2020   BUN 10 03/08/2020   CREATININE 0.80 03/08/2020   CALCIUM 9.7 03/08/2020   GFRNONAA >60 03/08/2020   GFRAA >60 10/18/2019   Lipid Panel:  Lab Results  Component Value Date   LDLCALC 118 (H) 10/02/2019   HgbA1c:  Lab Results  Component Value Date   HGBA1C 5.7 (H) 10/02/2019   Urine Drug Screen:     Component Value Date/Time   LABOPIA NONE DETECTED 03/08/2020 1755   COCAINSCRNUR NONE DETECTED 03/08/2020 1755   LABBENZ NONE DETECTED 03/08/2020 1755   AMPHETMU NONE DETECTED 03/08/2020 1755   THCU NONE DETECTED  03/08/2020 1755   LABBARB NONE DETECTED 03/08/2020 1755    Alcohol Level     Component Value Date/Time   ETH <10 03/08/2020 1724     Results for orders placed during the hospital encounter of 03/08/20  CT HEAD WO CONTRAST   IMPRESSION: 1. Acute to subacute left occipital cortical infarct, new since previous studies. 2. Stable chronic small-vessel ischemic changes throughout the white matter, thalami, and right temporal occipital region. 3. No acute hemorrhage.   Impression   [name] Good is a 69 y.o. female with PMH significant for  anxiety, Depression, GERD, MI, prior R occipital Stroke, hx of left apical thrombus on eliquis who presents with right sided vision loss x 6 days. Found to have L PCA stroke on CTH. Her neurologic examination is notable for R superior quadrant hemianopsia. She is compliant with her Eliquis.  Impression: - L PCA ischemic stroke  Recommendations   Recommend that primary team order following: - Frequent Neuro checks per stroke unit protocol - Recommend brain imaging with MRI Brain without contrast - Recommend Vascular imaging with MRA Angio Head without contrast and US Carotid doppler - Recommend obtaining TTE - Recommend obtaining Lipid panel with LDL - Please start statin if LDL > 70 - Recommend HbA1c - Antithrombotic - Aspirin 325mg  tonight, will need to discuss timing of resuming eliquis based on stroke size on MRI Brain. - Recommend DVT ppx - SBP goal - permissive hypertension first 24 h < 220/110. Held home meds.  - Recommend Telemetry monitoring for arrythmia - Recommend bedside swallow screen prior to PO intake. - Stroke education booklet - Recommend PT/OT/SLP consult  ______________________________________________________________________   Thank you for the opportunity to take part in the care of this patient. If you have any further questions, please contact the neurology consultation attending.  Signed,  Erick Blinks [facility] Pager Number [phone]

## 2020-03-09 ENCOUNTER — Inpatient Hospital Stay (HOSPITAL_COMMUNITY): Payer: Medicare Other

## 2020-03-09 ENCOUNTER — Encounter (HOSPITAL_COMMUNITY): Payer: Self-pay | Admitting: Internal Medicine

## 2020-03-09 DIAGNOSIS — I6389 Other cerebral infarction: Secondary | ICD-10-CM | POA: Diagnosis not present

## 2020-03-09 DIAGNOSIS — I63432 Cerebral infarction due to embolism of left posterior cerebral artery: Principal | ICD-10-CM

## 2020-03-09 LAB — HEMOGLOBIN A1C
Hgb A1c MFr Bld: 5.4 % (ref 4.8–5.6)
Mean Plasma Glucose: 108.28 mg/dL

## 2020-03-09 LAB — LIPID PANEL
Cholesterol: 157 mg/dL (ref 0–200)
HDL: 48 mg/dL (ref >40)
LDL Cholesterol: 92 mg/dL (ref 0–99)
Total CHOL/HDL Ratio: 3.3 RATIO
Triglycerides: 87 mg/dL (ref <150)
VLDL: 17 mg/dL (ref 0–40)

## 2020-03-09 LAB — ECHOCARDIOGRAM COMPLETE
Area-P 1/2: 2.24 cm2
Height: 63 in
S' Lateral: 3.4 cm
Weight: 2768 oz

## 2020-03-09 LAB — RESPIRATORY PANEL BY RT PCR (FLU A&B, COVID)
Influenza A by PCR: NEGATIVE
Influenza B by PCR: NEGATIVE
SARS Coronavirus 2 by RT PCR: NEGATIVE

## 2020-03-09 LAB — HIV ANTIBODY (ROUTINE TESTING W REFLEX): HIV Screen 4th Generation wRfx: NONREACTIVE

## 2020-03-09 IMAGING — CT CT ANGIO NECK
2 of 11 series · 8 of 33 positions shown · IV contrast (OMNI 350)
Comparison: Head CT yesterday.  MRI [DATE]

CLINICAL DATA: Blurred vision and dizziness. Likely recent left
occipital stroke by CT.

EXAM:
CT ANGIOGRAPHY HEAD AND NECK
TECHNIQUE: Multidetector CT imaging of the head and neck was performed using
the standard protocol during bolus administration of intravenous
contrast. Multiplanar CT image reconstructions and MIPs were
obtained to evaluate the vascular anatomy. Carotid stenosis
measurements (when applicable) are obtained utilizing NASCET
criteria, using the distal internal carotid diameter as the
denominator.
CONTRAST:  50mL OMNIPAQUE IOHEXOL 350 MG/ML SOLN

[Series 5: cta neck · axial · 0.50mm/px · z∈[+125,+471]mm · 3 of 174 slices shown]
[im 1/174  soft-tissue]
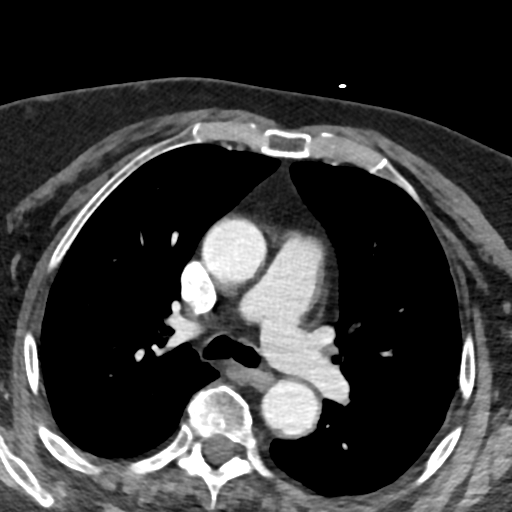
[im 87/174  bone]
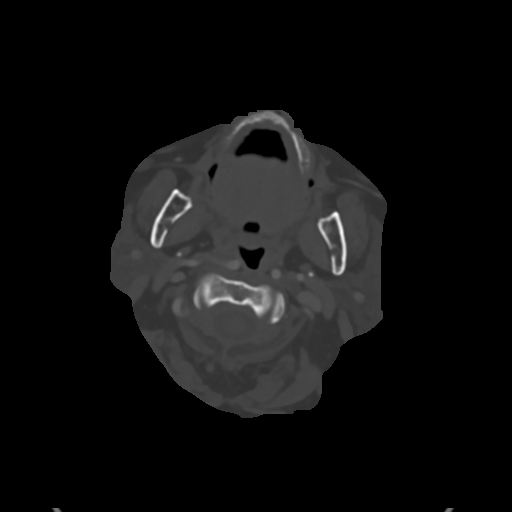
[im 174/174  soft-tissue]
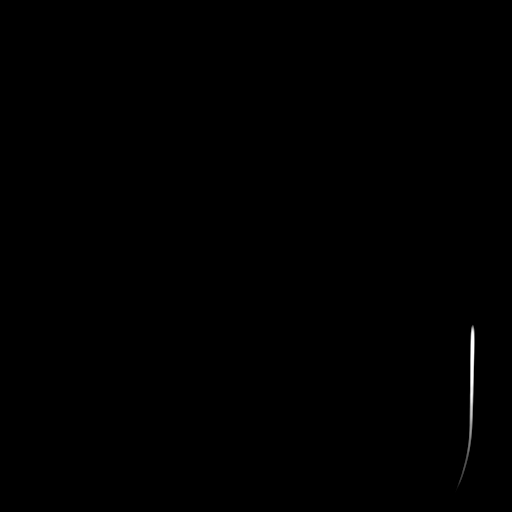

[Series 11: cta neck axial · axial · 0.46mm/px · z∈[+181,+406]mm · 5 of 339 slices shown]
[im 57/339  soft-tissue]
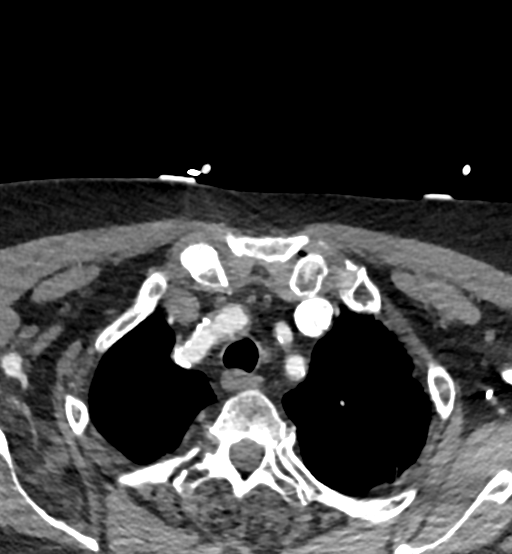
[im 113/339  soft-tissue]
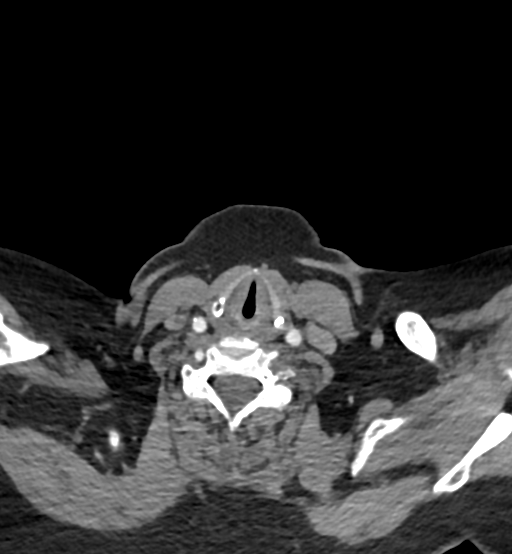
[im 170/339  soft-tissue]
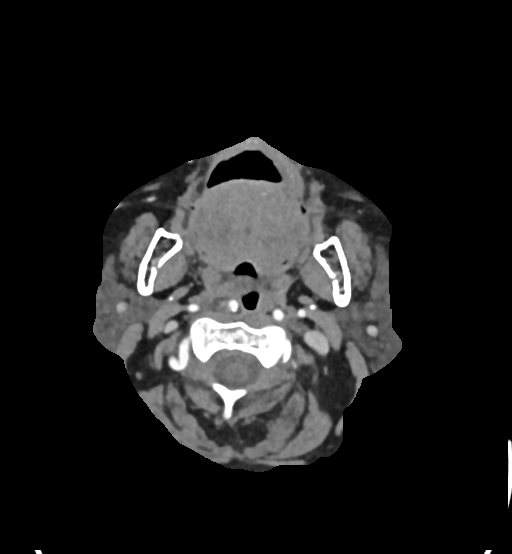
[im 226/339  soft-tissue]
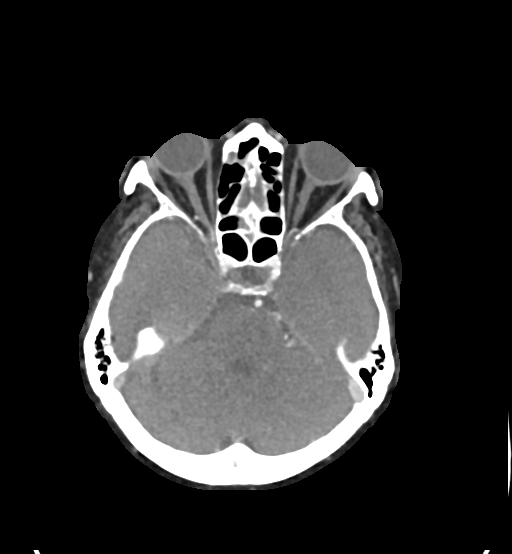
[im 282/339  soft-tissue]
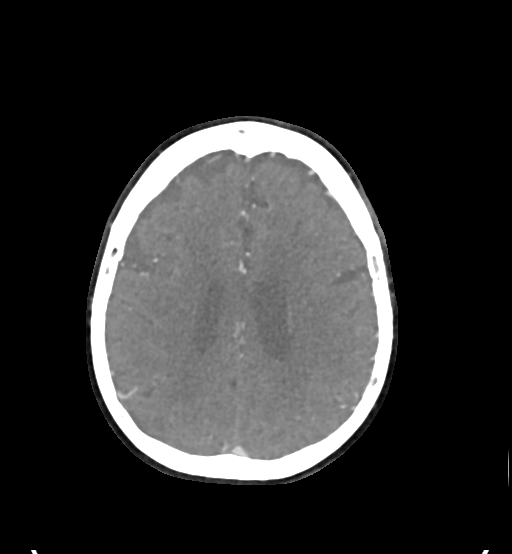

[8 of 33 positions shown; findings below may reference images not displayed]

FINDINGS: CT HEAD FINDINGS

Brain: Brainstem and cerebellum are unremarkable. Acute to subacute
infarctions in the left occipital lobe and left parietooccipital
junction. Chronic infarction in the right parieto-occipital
junction. Extensive chronic small-vessel ischemic changes throughout
the cerebral hemispheric white matter, basal ganglia and thalami. No
hemorrhage or mass effect. No hydrocephalus or extra-axial
collection.

Vascular: There is atherosclerotic calcification of the major
vessels at the base of the brain.

Skull: Negative

Sinuses: Mild mucosal inflammatory changes.

Orbits: Negative

Review of the MIP images confirms the above findings

CTA NECK FINDINGS

Aortic arch: Aortic atherosclerotic calcification. No aneurysm or
dissection. Branching pattern is normal.

Right carotid system: Common carotid artery shows atherosclerotic
plaque but is sufficiently patent to the bifurcation region.
Calcification at the carotid bifurcation and ICA bulb. Minimal
diameter at the distal bulb is 2.5 mm. Compared to a more distal
cervical ICA diameter of 5 mm, this indicates a 50% stenosis.

Left carotid system: Common carotid artery shows scattered plaque
but is sufficiently patent to the bifurcation region. Calcified
plaque at the carotid bifurcation and ICA bulb. No measurable
stenosis. Cervical ICA widely patent.

Vertebral arteries: Atherosclerotic change of the right subclavian
artery proximal to the vertebral artery origin but without flow
limiting stenosis. Right vertebral artery origin widely patent.
Right vertebral artery is the dominant vessel. There is scattered
plaque in the cervical region but no significant stenosis. Left
vertebral artery is occluded at its origin and reconstitutes in the
mid cervical region by cervical collaterals as a small vessel that
does show flow to the foramen magnum.

Skeleton: Mild cervical spondylosis.

Other neck: No soft tissue mass or lymphadenopathy.

Upper chest: Mild scarring at the lung apices.

Review of the MIP images confirms the above findings

CTA HEAD FINDINGS

Anterior circulation: Both internal carotid arteries are patent
through the skull base and siphon regions. There is siphon
atherosclerotic calcification but no stenosis greater than 30%. The
anterior and middle cerebral vessels are patent without large or
medium vessel occlusion or correctable proximal stenosis. No
aneurysm or vascular malformation. Patent posterior communicating
artery on the right.

Posterior circulation: Dominant right vertebral artery widely patent
to the basilar. Small left vertebral artery shows some flow through
the foramen magnum with supply to the left PICA. Minimal flow
present in a small distal left vertebral artery. No basilar
stenosis. Both superior cerebellar arteries show flow. There is
occlusion of the left P1 segment 1 cm from its origin. Left PCA is
patent, with some distal vessel atherosclerotic irregularity.

Venous sinuses: Patent and normal.

Anatomic variants: None other significant.

Review of the MIP images confirms the above findings
IMPRESSION: 1. Aortic atherosclerosis.
2. Atherosclerotic disease at both carotid bifurcations. 50%
stenosis of the distal bulb on the right. No measurable stenosis on
the left.
3. Occlusion of the left vertebral artery at its origin with
reconstitution in the mid cervical region by cervical collaterals as
a small vessel that does show flow to the foramen magnum. Dominant
right vertebral artery widely patent to the basilar.
4. Occlusion of the left P1 segment 1 cm from its origin.
5. Extensive chronic small-vessel ischemic changes throughout the
cerebral hemispheric white matter, basal ganglia and thalami.

Aortic Atherosclerosis ([VM]-[VM]).

## 2020-03-09 IMAGING — MR MR HEAD W/O CM
8 of 13 series · 29 of 48 positions shown · non-contrast
Comparison: Head and neck CTA [DATE].  Head MRI [DATE].

CLINICAL DATA: Confusion and right-sided vision loss/blurry vision.
History of stroke.

EXAM:
MRI HEAD WITHOUT CONTRAST
TECHNIQUE: Multiplanar, multiecho pulse sequences of the brain and surrounding
structures were obtained without intravenous contrast.

[Series 2: DWI · axial · 3.0mm · 0.94mm/px · z∈[-108,+35]mm · 6 of 100 slices shown (1 of 4)]
[im 1/100]
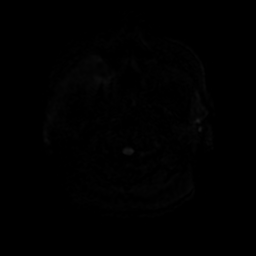
[im 20/100]
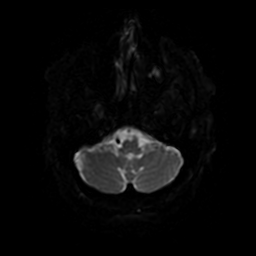
[im 40/100]
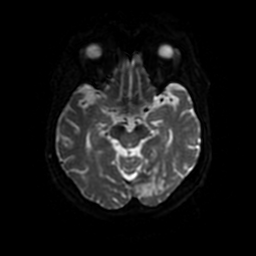
[im 60/100]
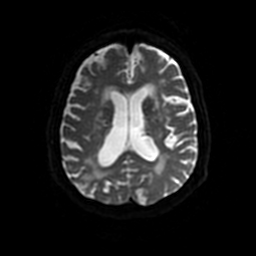
[im 80/100]
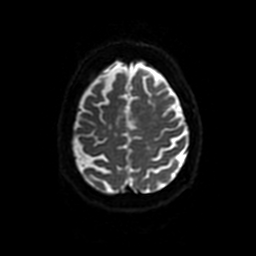
[im 100/100]
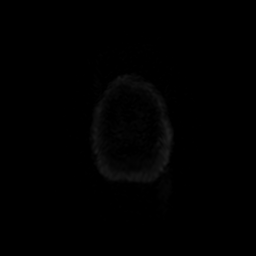

[Series 3: DWI · coronal · 4.0mm · 0.94mm/px · 4 of 74 slices shown (2 of 4)]
[im 1/74]
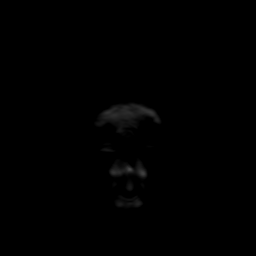
[im 25/74]
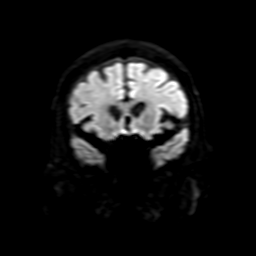
[im 49/74]
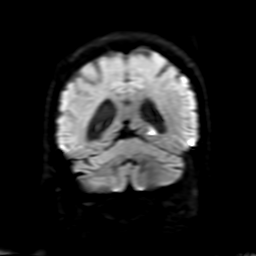
[im 74/74]
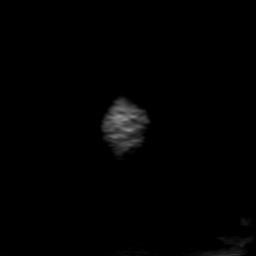

[Series 4: FLAIR · sagittal · 5.0mm · 0.23mm/px · 2 of 26 slices shown (1 of 2)]
[im 1/26]
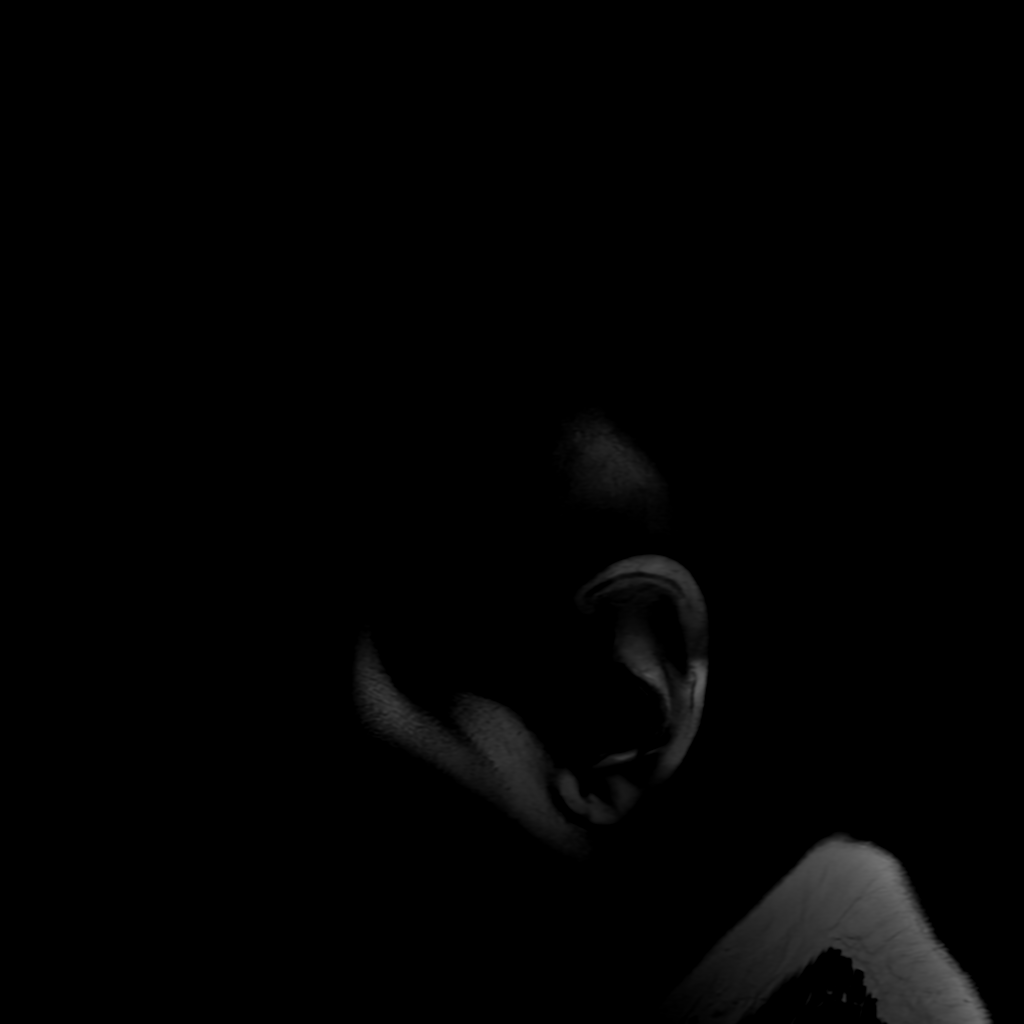
[im 26/26]
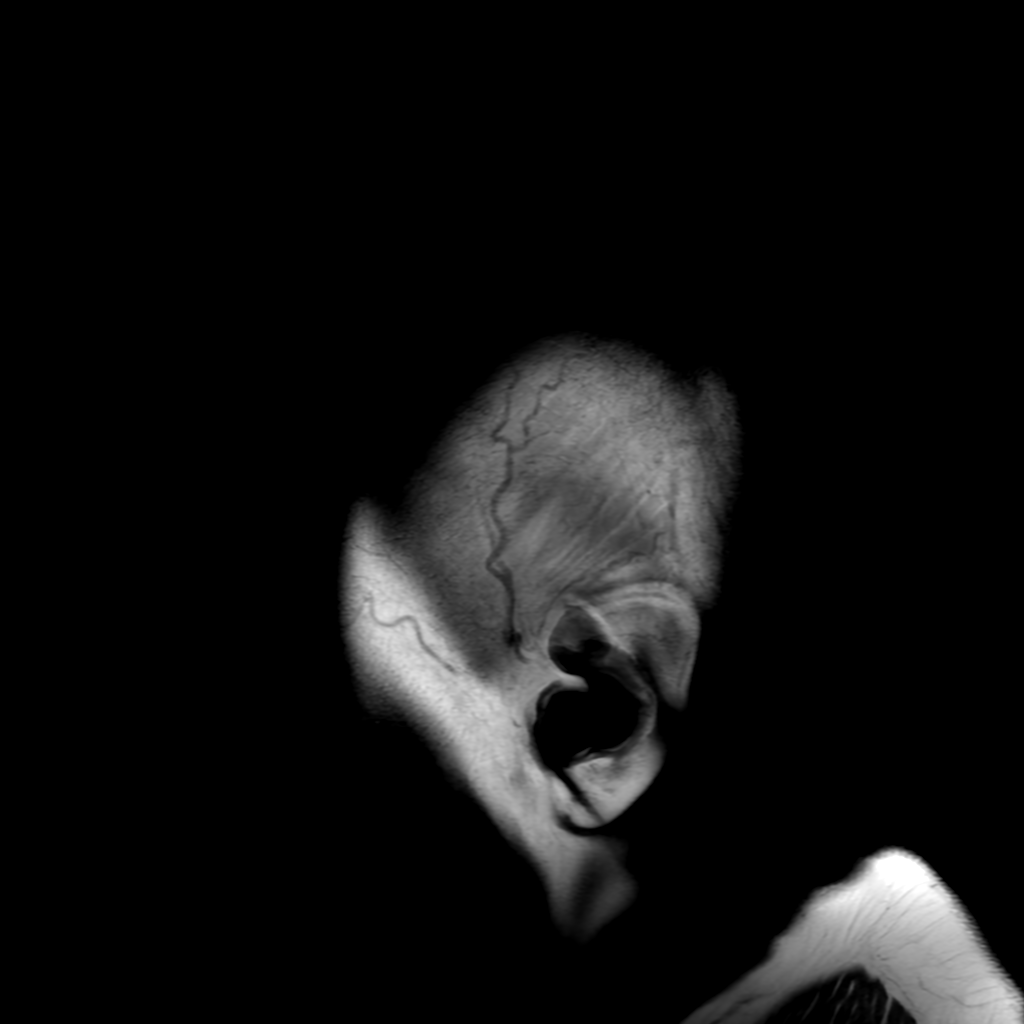

[Series 6: FLAIR · axial · 3.0mm · 0.45mm/px · z∈[-117,+35]mm · 2 of 27 slices shown (2 of 2)]
[im 1/27]
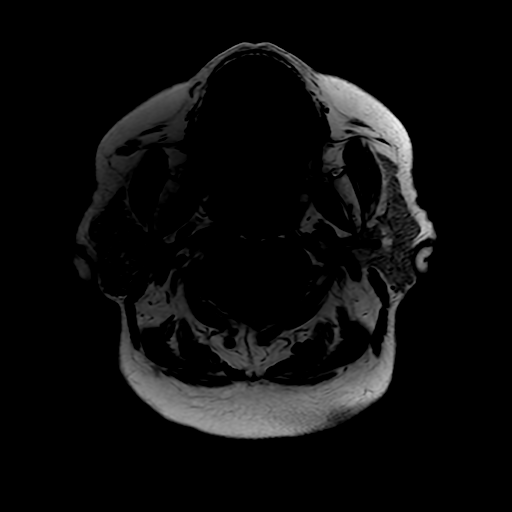
[im 27/27]
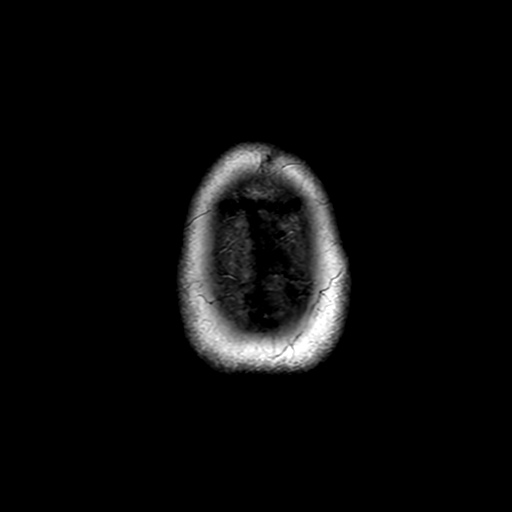

[Series 210: DWI · axial · 3.0mm · 0.94mm/px · z∈[-108,+35]mm · 6 of 100 slices shown (3 of 4)]
[im 1/100]
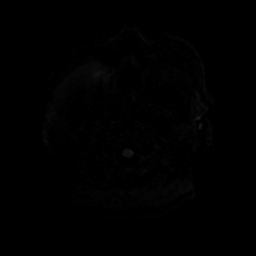
[im 20/100]
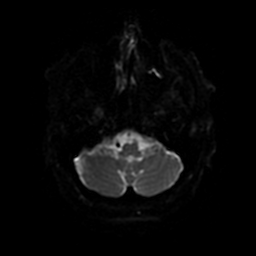
[im 40/100]
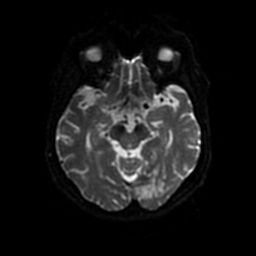
[im 60/100]
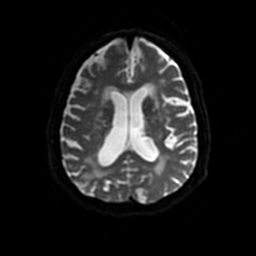
[im 80/100]
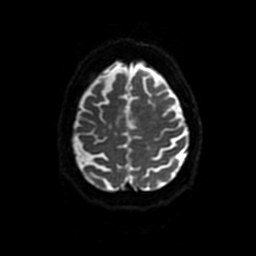
[im 100/100]
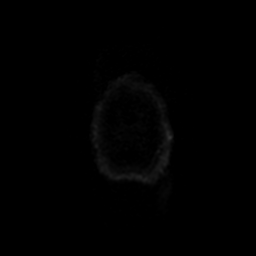

[Series 250: ADC · axial · 3.0mm · 0.94mm/px · z∈[-108,+35]mm · 3 of 50 slices shown (1 of 2)]
[im 1/50]
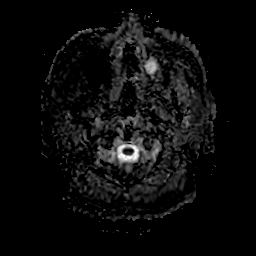
[im 25/50]
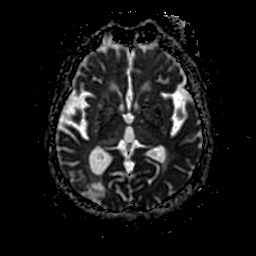
[im 50/50]
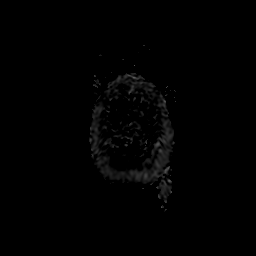

[Series 310: DWI · coronal · 4.0mm · 0.94mm/px · 4 of 74 slices shown (4 of 4)]
[im 1/74]
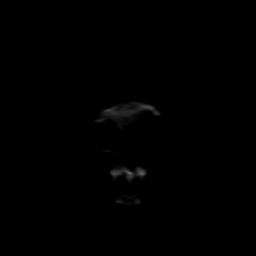
[im 25/74]
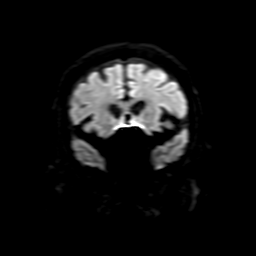
[im 49/74]
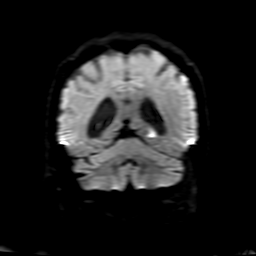
[im 74/74]
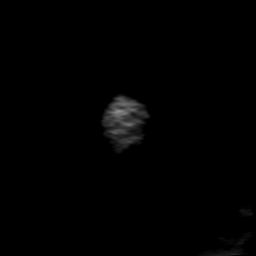

[Series 350: ADC · coronal · 4.0mm · 0.94mm/px · 2 of 37 slices shown (2 of 2)]
[im 1/37]
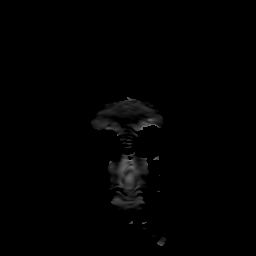
[im 37/37]
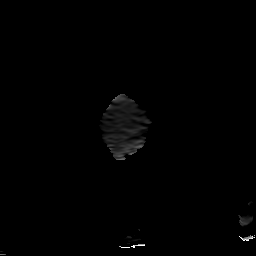

[29 of 48 positions shown; findings below may reference images not displayed]

FINDINGS: Brain: There are acute left PCA territory infarcts involving the
occipital lobe, posteromedial temporal lobe, and left thalamus. A
small chronic right parieto-occipital infarct is again noted with a
small amount of associated chronic blood products. Patchy to
confluent T2 hyperintensities in the cerebral white matter
bilaterally, deep gray nuclei, and brainstem are similar to the
prior MRI and are nonspecific but compatible with severe chronic
small vessel ischemic disease. A nonenlarged partially empty sella
is unchanged. There is mild-to-moderate cerebral atrophy. No mass,
midline shift, or extra-axial fluid collection is identified.

Vascular: Small and poorly visualized distal left vertebral artery,
more fully evaluated on today's CTA.

Skull and upper cervical spine: Unremarkable bone marrow signal.

Sinuses/Orbits: Bilateral cataract extraction. Left maxillary sinus
mucous retention cyst. Mild mucosal thickening in the other
paranasal sinuses. Trace left mastoid fluid.

Other: None.
IMPRESSION: 1. Patchy acute left PCA territory infarcts.
2. Severe chronic small vessel ischemic disease with multiple old
infarcts as above.

## 2020-03-09 MED ORDER — ASPIRIN EC 325 MG PO TBEC
325.0000 mg | DELAYED_RELEASE_TABLET | Freq: Every day | ORAL | Status: DC
  Administered 2020-03-09 – 2020-03-13 (×5): 325 mg via ORAL
  Filled 2020-03-09 (×5): qty 1

## 2020-03-09 MED ORDER — ATORVASTATIN CALCIUM 80 MG PO TABS
80.0000 mg | ORAL_TABLET | Freq: Every day | ORAL | Status: DC
  Administered 2020-03-09 – 2020-03-13 (×5): 80 mg via ORAL
  Filled 2020-03-09 (×5): qty 1

## 2020-03-09 MED ORDER — IOHEXOL 350 MG/ML SOLN
50.0000 mL | Freq: Once | INTRAVENOUS | Status: AC | PRN
  Administered 2020-03-09: 50 mL via INTRAVENOUS

## 2020-03-09 MED ORDER — LORAZEPAM 2 MG/ML IJ SOLN
1.0000 mg | Freq: Once | INTRAMUSCULAR | Status: AC | PRN
  Administered 2020-03-09: 1 mg via INTRAVENOUS
  Filled 2020-03-09: qty 1

## 2020-03-09 NOTE — Progress Notes (Signed)
PT Cancellation Note  Patient Details Name: [name] Good MRN: [id number] DOB: 1950-05-18   Cancelled Treatment:    Reason Eval/Treat Not Completed: Patient at procedure or test/unavailable. Pt in CT. PT to re-attempt as time allows.   Ilda Foil 03/09/2020, 8:44 AM   Aida Raider, PT  Office # [phone] Pager [phone]

## 2020-03-09 NOTE — Progress Notes (Signed)
PROGRESS NOTE    [name] Good   ZOX:[id number]  DOB: 23-Dec-1950  DOA: 03/08/2020     1  PCP: Aida Puffer, MD  CC: AMS, right blurry vision   Hospital Course: [name] Good is a 69 y.o. 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  Started on Eliquis.  LV thrombus still present on repeat office visit 3 months later per daughter.   [name] Good reports compliance with eliquis.   [name] Good in to ED for confusion / AMS onset on Past Sat (6 days).  Loss of vision / blurry vision to the R side.  Had argument with husband on Sat night, headache onset after this.  Laid down to rest and headache resolved, but after this she noticed vision changes and felt confused.   No extremity weakness, no numbness nor tingling in extremities, no speech difficulty.   Daughter reports increasing confusion during phone conversations since Sat.  CT head in the ER revealed a acute to subacute left occipital cortical infarct, new since previous imaging.  Stable chronic small vessel ischemic changes also noted throughout.   Interval History:  Seen resting in bed this morning in no distress.  Still having ongoing right eye decreased vision but is able to see fingers/numbers in front of her.  Old records reviewed in assessment of this patient  ROS: Constitutional: negative for chills and fevers, Respiratory: negative for cough, Cardiovascular: negative for chest pain and Gastrointestinal: negative for abdominal pain  Assessment & Plan: * Acute embolic stroke (HCC) - follow up MRI brain - CTA head/neck shows occluded left vertebral artery but reconstitution distally and patent right VA; extensive chronic small vessel changes - follow up neuro recs - continue asa 325 until eliquis further clarified with neurology  Acute lower UTI - follow up urine culture - continue rocephin   Left  ventricular apical thrombus - eliquis on hold for now - follow up MRI brain and awaiting further discussion with neuro regarding resumption of eliquis   History of restless legs syndrome - continue requip    Antimicrobials: Rocephin  DVT prophylaxis: Lovenox Code Status: Full Family Communication: none present Disposition Plan: Status is: Inpatient  Remains inpatient appropriate because:Ongoing diagnostic testing needed not appropriate for outpatient work up, IV treatments appropriate due to intensity of illness or inability to take PO and Inpatient level of care appropriate due to severity of illness   Dispo: The patient is from: Home              Anticipated d/c is to: Home              Anticipated d/c date is: 1 day              Patient currently is not medically stable to d/c.  Objective: Blood pressure 130/75, pulse 83, temperature 98.5 F (36.9 C), temperature source Oral, resp. rate 18, height 5\' 3"  (1.6 m), weight 78.5 kg, SpO2 97 %.  Examination: General appearance: alert, cooperative and no distress Head: Normocephalic, without obvious abnormality, atraumatic Eyes: EOMI Lungs: clear to auscultation bilaterally Heart: regular rate and rhythm and S1, S2 normal Abdomen: soft, NT, ND, BS present Extremities: no edema Skin: mobility and turgor normal Neurologic: decreased vision in right eye (loss of peripheral vision)  Consultants:   neuro  Procedures:     Data Reviewed: I have personally reviewed following labs  and imaging studies Results for orders placed or performed during the hospital encounter of 03/08/20 (from the past 24 hour(s))  Protime-INR     Status: None   Collection Time: 03/08/20  5:13 PM  Result Value Ref Range   Prothrombin Time 15.0 11.4 - 15.2 seconds   INR 1.2 0.8 - 1.2  APTT     Status: None   Collection Time: 03/08/20  5:13 PM  Result Value Ref Range   aPTT 33 24 - 36 seconds  CBC     Status: None   Collection Time: 03/08/20   5:13 PM  Result Value Ref Range   WBC 8.5 4.0 - 10.5 K/uL   RBC 4.77 3.87 - 5.11 MIL/uL   Hemoglobin 14.6 12.0 - 15.0 g/dL   HCT 16.144.2 36 - 46 %   MCV 92.7 80.0 - 100.0 fL   MCH 30.6 26.0 - 34.0 pg   MCHC 33.0 30.0 - 36.0 g/dL   RDW 09.612.1 04.511.5 - 40.915.5 %   Platelets 244 150 - 400 K/uL   nRBC 0.0 0.0 - 0.2 %  Differential     Status: None   Collection Time: 03/08/20  5:13 PM  Result Value Ref Range   Neutrophils Relative % 58 %   Neutro Abs 4.9 1.7 - 7.7 K/uL   Lymphocytes Relative 29 %   Lymphs Abs 2.5 0.7 - 4.0 K/uL   Monocytes Relative 8 %   Monocytes Absolute 0.7 0.1 - 1.0 K/uL   Eosinophils Relative 4 %   Eosinophils Absolute 0.3 0.0 - 0.5 K/uL   Basophils Relative 1 %   Basophils Absolute 0.1 0.0 - 0.1 K/uL   Immature Granulocytes 0 %   Abs Immature Granulocytes 0.02 0.00 - 0.07 K/uL  Comprehensive metabolic panel     Status: None   Collection Time: 03/08/20  5:13 PM  Result Value Ref Range   Sodium 141 135 - 145 mmol/L   Potassium 3.9 3.5 - 5.1 mmol/L   Chloride 107 98 - 111 mmol/L   CO2 24 22 - 32 mmol/L   Glucose, Bld 94 70 - 99 mg/dL   BUN 10 8 - 23 mg/dL   Creatinine, Ser 8.110.80 0.44 - 1.00 mg/dL   Calcium 9.7 8.9 - 91.410.3 mg/dL   Total Protein 7.1 6.5 - 8.1 g/dL   Albumin 3.9 3.5 - 5.0 g/dL   AST 23 15 - 41 U/L   ALT 21 0 - 44 U/L   Alkaline Phosphatase 96 38 - 126 U/L   Total Bilirubin 0.8 0.3 - 1.2 mg/dL   GFR, Estimated >78>60 >29>60 mL/min   Anion gap 10 5 - 15  Ethanol     Status: None   Collection Time: 03/08/20  5:24 PM  Result Value Ref Range   Alcohol, Ethyl (B) <10 <10 mg/dL  Urine rapid drug screen (hosp performed)     Status: None   Collection Time: 03/08/20  5:55 PM  Result Value Ref Range   Opiates NONE DETECTED NONE DETECTED   Cocaine NONE DETECTED NONE DETECTED   Benzodiazepines NONE DETECTED NONE DETECTED   Amphetamines NONE DETECTED NONE DETECTED   Tetrahydrocannabinol NONE DETECTED NONE DETECTED   Barbiturates NONE DETECTED NONE DETECTED    Urinalysis, Routine w reflex microscopic Urine, Clean Catch     Status: Abnormal   Collection Time: 03/08/20  5:55 PM  Result Value Ref Range   Color, Urine YELLOW YELLOW   APPearance CLEAR CLEAR   Specific Gravity, Urine 1.004 (  L) 1.005 - 1.030   pH 7.0 5.0 - 8.0   Glucose, UA NEGATIVE NEGATIVE mg/dL   Hgb urine dipstick SMALL (A) NEGATIVE   Bilirubin Urine NEGATIVE NEGATIVE   Ketones, ur NEGATIVE NEGATIVE mg/dL   Protein, ur NEGATIVE NEGATIVE mg/dL   Nitrite POSITIVE (A) NEGATIVE   Leukocytes,Ua MODERATE (A) NEGATIVE   RBC / HPF 0-5 0 - 5 RBC/hpf   WBC, UA 6-10 0 - 5 WBC/hpf   Bacteria, UA MANY (A) NONE SEEN   Squamous Epithelial / LPF 0-5 0 - 5  Urine culture     Status: Abnormal (Preliminary result)   Collection Time: 03/08/20  5:55 PM   Specimen: Urine, Random  Result Value Ref Range   Specimen Description URINE, RANDOM    Special Requests NONE    Culture (A)     >=100,000 COLONIES/mL ESCHERICHIA COLI SUSCEPTIBILITIES TO FOLLOW Performed at [facility], [address], [place], [place] [address]    Report Status PENDING   Respiratory Panel by RT PCR (Flu A&B, Covid) - Nasopharyngeal Swab     Status: None   Collection Time: 03/08/20  9:04 PM   Specimen: Nasopharyngeal Swab  Result Value Ref Range   SARS Coronavirus 2 by RT PCR NEGATIVE NEGATIVE   Influenza A by PCR NEGATIVE NEGATIVE   Influenza B by PCR NEGATIVE NEGATIVE  HIV Antibody (routine testing w rflx)     Status: None   Collection Time: 03/09/20  1:09 AM  Result Value Ref Range   HIV Screen 4th Generation wRfx Non Reactive Non Reactive  Hemoglobin A1c     Status: None   Collection Time: 03/09/20  2:15 AM  Result Value Ref Range   Hgb A1c MFr Bld 5.4 4.8 - 5.6 %   Mean Plasma Glucose 108.28 mg/dL  Lipid panel     Status: None   Collection Time: 03/09/20  2:15 AM  Result Value Ref Range   Cholesterol 157 0 - 200 mg/dL   Triglycerides 87 <482 mg/dL   HDL 48 >50 mg/dL   Total CHOL/HDL Ratio 3.3  RATIO   VLDL 17 0 - 40 mg/dL   LDL Cholesterol 92 0 - 99 mg/dL    Recent Results (from the past 240 hour(s))  Urine culture     Status: Abnormal (Preliminary result)   Collection Time: 03/08/20  5:55 PM   Specimen: Urine, Random  Result Value Ref Range Status   Specimen Description URINE, RANDOM  Final   Special Requests NONE  Final   Culture (A)  Final    >=100,000 COLONIES/mL ESCHERICHIA COLI SUSCEPTIBILITIES TO FOLLOW Performed at [facility], [address] [place], [place] [address]    Report Status PENDING  Incomplete  Respiratory Panel by RT PCR (Flu A&B, Covid) - Nasopharyngeal Swab     Status: None   Collection Time: 03/08/20  9:04 PM   Specimen: Nasopharyngeal Swab  Result Value Ref Range Status   SARS Coronavirus 2 by RT PCR NEGATIVE NEGATIVE Final    Comment: (NOTE) SARS-CoV-2 target nucleic acids are NOT DETECTED.  The SARS-CoV-2 RNA is generally detectable in upper respiratoy specimens during the acute phase of infection. The lowest concentration of SARS-CoV-2 viral copies this assay can detect is 131 copies/mL. A negative result does not preclude SARS-Cov-2 infection and should not be used as the sole basis for treatment or other patient management decisions. A negative result may occur with  improper specimen collection/handling, submission of specimen other than  nasopharyngeal swab, presence of viral mutation(s) within the areas targeted by this assay, and inadequate number of viral copies (<131 copies/mL). A negative result must be combined with clinical observations, patient history, and epidemiological information. The expected result is Negative.  Fact Sheet for Patients:  https://www.moore.com/  Fact Sheet for Healthcare Providers:  https://www.young.biz/  This test is no t yet approved or cleared by the [place] FDA and  has been authorized for detection and/or diagnosis of SARS-CoV-2 by FDA under  an Emergency Use Authorization (EUA). This EUA will remain  in effect (meaning this test can be used) for the duration of the COVID-19 declaration under Section 564(b)(1) of the Act, 21 U.S.C. section 360bbb-3(b)(1), unless the authorization is terminated or revoked sooner.     Influenza A by PCR NEGATIVE NEGATIVE Final   Influenza B by PCR NEGATIVE NEGATIVE Final    Comment: (NOTE) The Xpert Xpress SARS-CoV-2/FLU/RSV assay is intended as an aid in  the diagnosis of influenza from Nasopharyngeal swab specimens and  should not be used as a sole basis for treatment. Nasal washings and  aspirates are unacceptable for Xpert Xpress SARS-CoV-2/FLU/RSV  testing.  Fact Sheet for Patients: https://www.moore.com/  Fact Sheet for Healthcare Providers: https://www.young.biz/  This test is not yet approved or cleared by the [place] FDA and  has been authorized for detection and/or diagnosis of SARS-CoV-2 by  FDA under an Emergency Use Authorization (EUA). This EUA will remain  in effect (meaning this test can be used) for the duration of the  Covid-19 declaration under Section 564(b)(1) of the Act, 21  U.S.C. section 360bbb-3(b)(1), unless the authorization is  terminated or revoked. Performed at [facility], [address] [place], [place] [address]      Radiology Studies: CT ANGIO HEAD W OR WO CONTRAST  Result Date: 03/09/2020 CLINICAL DATA:  Blurred vision and dizziness. Likely recent left occipital stroke by CT. EXAM: CT ANGIOGRAPHY HEAD AND NECK TECHNIQUE: Multidetector CT imaging of the head and neck was performed using the standard protocol during bolus administration of intravenous contrast. Multiplanar CT image reconstructions and MIPs were obtained to evaluate the vascular anatomy. Carotid stenosis measurements (when applicable) are obtained utilizing NASCET criteria, using the distal internal carotid diameter as the denominator.  CONTRAST:  50mL OMNIPAQUE IOHEXOL 350 MG/ML SOLN COMPARISON:  Head CT yesterday.  MRI 10/02/2019 FINDINGS: CT HEAD FINDINGS Brain: Brainstem and cerebellum are unremarkable. Acute to subacute infarctions in the left occipital lobe and left parietooccipital junction. Chronic infarction in the right parieto-occipital junction. Extensive chronic small-vessel ischemic changes throughout the cerebral hemispheric white matter, basal ganglia and thalami. No hemorrhage or mass effect. No hydrocephalus or extra-axial collection. Vascular: There is atherosclerotic calcification of the major vessels at the base of the brain. Skull: Negative Sinuses: Mild mucosal inflammatory changes. Orbits: Negative Review of the MIP images confirms the above findings CTA NECK FINDINGS Aortic arch: Aortic atherosclerotic calcification. No aneurysm or dissection. Branching pattern is normal. Right carotid system: Common carotid artery shows atherosclerotic plaque but is sufficiently patent to the bifurcation region. Calcification at the carotid bifurcation and ICA bulb. Minimal diameter at the distal bulb is 2.5 mm. Compared to a more distal cervical ICA diameter of 5 mm, this indicates a 50% stenosis. Left carotid system: Common carotid artery shows scattered plaque but is sufficiently patent to the bifurcation region. Calcified plaque at the carotid bifurcation and ICA bulb. No measurable stenosis. Cervical ICA widely patent. Vertebral arteries: Atherosclerotic change of the right  subclavian artery proximal to the vertebral artery origin but without flow limiting stenosis. Right vertebral artery origin widely patent. Right vertebral artery is the dominant vessel. There is scattered plaque in the cervical region but no significant stenosis. Left vertebral artery is occluded at its origin and reconstitutes in the mid cervical region by cervical collaterals as a small vessel that does show flow to the foramen magnum. Skeleton: Mild cervical  spondylosis. Other neck: No soft tissue mass or lymphadenopathy. Upper chest: Mild scarring at the lung apices. Review of the MIP images confirms the above findings CTA HEAD FINDINGS Anterior circulation: Both internal carotid arteries are patent through the skull base and siphon regions. There is siphon atherosclerotic calcification but no stenosis greater than 30%. The anterior and middle cerebral vessels are patent without large or medium vessel occlusion or correctable proximal stenosis. No aneurysm or vascular malformation. Patent posterior communicating artery on the right. Posterior circulation: Dominant right vertebral artery widely patent to the basilar. Small left vertebral artery shows some flow through the foramen magnum with supply to the left PICA. Minimal flow present in a small distal left vertebral artery. No basilar stenosis. Both superior cerebellar arteries show flow. There is occlusion of the left P1 segment 1 cm from its origin. Left PCA is patent, with some distal vessel atherosclerotic irregularity. Venous sinuses: Patent and normal. Anatomic variants: None other significant. Review of the MIP images confirms the above findings IMPRESSION: 1. Aortic atherosclerosis. 2. Atherosclerotic disease at both carotid bifurcations. 50% stenosis of the distal bulb on the right. No measurable stenosis on the left. 3. Occlusion of the left vertebral artery at its origin with reconstitution in the mid cervical region by cervical collaterals as a small vessel that does show flow to the foramen magnum. Dominant right vertebral artery widely patent to the basilar. 4. 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 5. Extensive chronic small-vessel ischemic changes throughout the cerebral hemispheric white matter, basal ganglia and thalami. Aortic Atherosclerosis (ICD10-I70.0). Electronically Signed   By: Paulina Fusi M.D.   On: 03/09/2020 09:06   DG Chest 2 View  Result Date: 03/08/2020 CLINICAL  DATA:  Blurred vision, dizziness, left occipital infarct EXAM: CHEST - 2 VIEW COMPARISON:  10/16/2017 FINDINGS: The heart size and mediastinal contours are within normal limits. Both lungs are clear. The visualized skeletal structures are unremarkable. IMPRESSION: No active cardiopulmonary disease. Electronically Signed   By: Sharlet Salina M.D.   On: 03/08/2020 21:13   CT HEAD WO CONTRAST  Result Date: 03/08/2020 CLINICAL DATA:  Blurred vision, dizziness EXAM: CT HEAD WITHOUT CONTRAST TECHNIQUE: Contiguous axial images were obtained from the base of the skull through the vertex without intravenous contrast. COMPARISON:  10/01/2019 FINDINGS: Brain: Since the prior exam, hypodensity has developed in the left occipital region, primarily in the subcortical white matter, consistent with acute to subacute infarct. Areas of chronic ischemic change are again seen throughout the periventricular white matter, thalami, and right temporal occipital region. No acute hemorrhage. Lateral ventricles and remaining midline structures are unremarkable. No acute extra-axial fluid collections. No mass effect. Vascular: No hyperdense vessel or unexpected calcification. Skull: Normal. Negative for fracture or focal lesion. Sinuses/Orbits: Chronic mucosal thickening within the left maxillary sinus, right sphenoid sinus, and ethmoid air cells. Other: None. IMPRESSION: 1. Acute to subacute left occipital cortical infarct, new since previous studies. 2. Stable chronic small-vessel ischemic changes throughout the white matter, thalami, and right temporal occipital region. 3. No acute hemorrhage. These results  were called by telephone at the time of interpretation on 03/08/2020 at 7:13pm to provider [facility] , who verbally acknowledged these results. Electronically Signed   By: Sharlet Salina M.D.   On: 03/08/2020 19:16   CT ANGIO NECK W OR WO CONTRAST  Result Date: 03/09/2020 CLINICAL DATA:  Blurred vision and dizziness.  Likely recent left occipital stroke by CT. EXAM: CT ANGIOGRAPHY HEAD AND NECK TECHNIQUE: Multidetector CT imaging of the head and neck was performed using the standard protocol during bolus administration of intravenous contrast. Multiplanar CT image reconstructions and MIPs were obtained to evaluate the vascular anatomy. Carotid stenosis measurements (when applicable) are obtained utilizing NASCET criteria, using the distal internal carotid diameter as the denominator. CONTRAST:  50mL OMNIPAQUE IOHEXOL 350 MG/ML SOLN COMPARISON:  Head CT yesterday.  MRI 10/02/2019 FINDINGS: CT HEAD FINDINGS Brain: Brainstem and cerebellum are unremarkable. Acute to subacute infarctions in the left occipital lobe and left parietooccipital junction. Chronic infarction in the right parieto-occipital junction. Extensive chronic small-vessel ischemic changes throughout the cerebral hemispheric white matter, basal ganglia and thalami. No hemorrhage or mass effect. No hydrocephalus or extra-axial collection. Vascular: There is atherosclerotic calcification of the major vessels at the base of the brain. Skull: Negative Sinuses: Mild mucosal inflammatory changes. Orbits: Negative Review of the MIP images confirms the above findings CTA NECK FINDINGS Aortic arch: Aortic atherosclerotic calcification. No aneurysm or dissection. Branching pattern is normal. Right carotid system: Common carotid artery shows atherosclerotic plaque but is sufficiently patent to the bifurcation region. Calcification at the carotid bifurcation and ICA bulb. Minimal diameter at the distal bulb is 2.5 mm. Compared to a more distal cervical ICA diameter of 5 mm, this indicates a 50% stenosis. Left carotid system: Common carotid artery shows scattered plaque but is sufficiently patent to the bifurcation region. Calcified plaque at the carotid bifurcation and ICA bulb. No measurable stenosis. Cervical ICA widely patent. Vertebral arteries: Atherosclerotic change of the  right subclavian artery proximal to the vertebral artery origin but without flow limiting stenosis. Right vertebral artery origin widely patent. Right vertebral artery is the dominant vessel. There is scattered plaque in the cervical region but no significant stenosis. Left vertebral artery is occluded at its origin and reconstitutes in the mid cervical region by cervical collaterals as a small vessel that does show flow to the foramen magnum. Skeleton: Mild cervical spondylosis. Other neck: No soft tissue mass or lymphadenopathy. Upper chest: Mild scarring at the lung apices. Review of the MIP images confirms the above findings CTA HEAD FINDINGS Anterior circulation: Both internal carotid arteries are patent through the skull base and siphon regions. There is siphon atherosclerotic calcification but no stenosis greater than 30%. The anterior and middle cerebral vessels are patent without large or medium vessel occlusion or correctable proximal stenosis. No aneurysm or vascular malformation. Patent posterior communicating artery on the right. Posterior circulation: Dominant right vertebral artery widely patent to the basilar. Small left vertebral artery shows some flow through the foramen magnum with supply to the left PICA. Minimal flow present in a small distal left vertebral artery. No basilar stenosis. Both superior cerebellar arteries show flow. There is occlusion of the left P1 segment 1 cm from its origin. Left PCA is patent, with some distal vessel atherosclerotic irregularity. Venous sinuses: Patent and normal. Anatomic variants: None other significant. Review of the MIP images confirms the above findings IMPRESSION: 1. Aortic atherosclerosis. 2. Atherosclerotic disease at both carotid bifurcations. 50% stenosis of the distal bulb on the right.  No measurable stenosis on the left. 3. Occlusion of the left vertebral artery at its origin with reconstitution in the mid cervical region by cervical collaterals as  a small vessel that does show flow to the foramen magnum. Dominant right vertebral artery widely patent to the basilar. 4. 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 5. Extensive chronic small-vessel ischemic changes throughout the cerebral hemispheric white matter, basal ganglia and thalami. Aortic Atherosclerosis (ICD10-I70.0). Electronically Signed   By: Paulina Fusi M.D.   On: 03/09/2020 09:06   MR BRAIN WO CONTRAST  Result Date: 03/09/2020 CLINICAL DATA:  Confusion and right-sided vision loss/blurry vision. History of stroke. EXAM: MRI HEAD WITHOUT CONTRAST TECHNIQUE: Multiplanar, multiecho pulse sequences of the brain and surrounding structures were obtained without intravenous contrast. COMPARISON:  Head and neck CTA 03/09/2020.  Head MRI 10/02/2019. FINDINGS: Brain: There are acute left PCA territory infarcts involving the occipital lobe, posteromedial temporal lobe, and left thalamus. A small chronic right parieto-occipital infarct is again noted with a small amount of associated chronic blood products. Patchy to confluent T2 hyperintensities in the cerebral white matter bilaterally, deep gray nuclei, and brainstem are similar to the prior MRI and are nonspecific but compatible with severe chronic small vessel ischemic disease. A nonenlarged partially empty sella is unchanged. There is mild-to-moderate cerebral atrophy. No mass, midline shift, or extra-axial fluid collection is identified. Vascular: Small and poorly visualized distal left vertebral artery, more fully evaluated on today's CTA. Skull and upper cervical spine: Unremarkable bone marrow signal. Sinuses/Orbits: Bilateral cataract extraction. Left maxillary sinus mucous retention cyst. Mild mucosal thickening in the other paranasal sinuses. Trace left mastoid fluid. Other: None. IMPRESSION: 1. Patchy acute left PCA territory infarcts. 2. Severe chronic small vessel ischemic disease with multiple old infarcts as above.  Electronically Signed   By: Sebastian Ache M.D.   On: 03/09/2020 16:50   ECHOCARDIOGRAM COMPLETE  Result Date: 03/09/2020    ECHOCARDIOGRAM REPORT   Patient Name:   SHAWNAY BRAMEL Date of Exam: 03/09/2020 Medical Rec #:  [id number]      Height:       63.0 in Accession #:    [id number]     Weight:       173.0 lb Date of Birth:  1950/09/15      BSA:          1.818 m Patient Age:    69 years       BP:           122/78 mmHg Patient Gender: F              HR:           84 bpm. Exam Location:  Inpatient Procedure: 2D Echo, Cardiac Doppler and Color Doppler Indications:    Stroke 434.91 / I163.9  History:        Patient has prior history of Echocardiogram examinations, most                 recent 10/02/2019. CHF, Previous Myocardial Infarction and CAD,                 Stroke and COPD, Signs/Symptoms:Dyspnea; Risk                 Factors:Hypertension and Diabetes.  Sonographer:    Eulah Pont RDCS Referring Phys: [phone] JARED M GARDNER IMPRESSIONS  1. Left ventricular ejection fraction, by estimation, is 60 to 65%. The left ventricle has normal function. The  left ventricle has no regional wall motion abnormalities. Left ventricular diastolic parameters are consistent with Grade I diastolic dysfunction (impaired relaxation).  2. Right ventricular systolic function is normal. The right ventricular size is normal. There is normal pulmonary artery systolic pressure. The estimated right ventricular systolic pressure is 23.1 mmHg.  3. The mitral valve is normal in structure. No evidence of mitral valve regurgitation. No evidence of mitral stenosis.  4. The aortic valve was not well visualized. Aortic valve regurgitation is not visualized. No aortic stenosis is present.  5. The inferior vena cava is normal in size with greater than 50% respiratory variability, suggesting right atrial pressure of 3 mmHg. FINDINGS  Left Ventricle: Left ventricular ejection fraction, by estimation, is 60 to 65%. The left ventricle has normal  function. The left ventricle has no regional wall motion abnormalities. The left ventricular internal cavity size was normal in size. There is  no left ventricular hypertrophy. Left ventricular diastolic parameters are consistent with Grade I diastolic dysfunction (impaired relaxation). Normal left ventricular filling pressure. Right Ventricle: The right ventricular size is normal. No increase in right ventricular wall thickness. Right ventricular systolic function is normal. There is normal pulmonary artery systolic pressure. The tricuspid regurgitant velocity is 2.24 m/s, and  with an assumed right atrial pressure of 3 mmHg, the estimated right ventricular systolic pressure is 23.1 mmHg. Left Atrium: Left atrial size was normal in size. Right Atrium: Right atrial size was normal in size. Pericardium: There is no evidence of pericardial effusion. Mitral Valve: The mitral valve is normal in structure. Mild to moderate mitral annular calcification. No evidence of mitral valve regurgitation. No evidence of mitral valve stenosis. Tricuspid Valve: The tricuspid valve is normal in structure. Tricuspid valve regurgitation is trivial. No evidence of tricuspid stenosis. Aortic Valve: The aortic valve was not well visualized. Aortic valve regurgitation is not visualized. No aortic stenosis is present. Pulmonic Valve: The pulmonic valve was normal in structure. Pulmonic valve regurgitation is not visualized. No evidence of pulmonic stenosis. Aorta: The aortic root is normal in size and structure. Venous: The inferior vena cava is normal in size with greater than 50% respiratory variability, suggesting right atrial pressure of 3 mmHg. IAS/Shunts: No atrial level shunt detected by color flow Doppler.  LEFT VENTRICLE PLAX 2D LVIDd:         4.50 cm  Diastology LVIDs:         3.40 cm  LV e' medial:    5.44 cm/s LV PW:         0.90 cm  LV E/e' medial:  11.4 LV IVS:        1.10 cm  LV e' lateral:   6.53 cm/s LVOT diam:     1.90 cm   LV E/e' lateral: 9.5 LV SV:         60 LV SV Index:   33 LVOT Area:     2.84 cm  RIGHT VENTRICLE RV S prime:     14.50 cm/s TAPSE (M-mode): 2.2 cm LEFT ATRIUM             Index       RIGHT ATRIUM          Index LA diam:        3.20 cm 1.76 cm/m  RA Area:     8.71 cm LA Vol (A2C):   40.6 ml 22.33 ml/m RA Volume:   14.90 ml 8.20 ml/m LA Vol (A4C):   21.4 ml 11.77 ml/m LA  Biplane Vol: 31.0 ml 17.05 ml/m  AORTIC VALVE LVOT Vmax:   116.00 cm/s LVOT Vmean:  76.800 cm/s LVOT VTI:    0.210 m  AORTA Ao Root diam: 2.90 cm Ao Asc diam:  3.00 cm MITRAL VALVE               TRICUSPID VALVE MV Area (PHT): 2.24 cm    TR Peak grad:   20.1 mmHg MV Decel Time: 338 msec    TR Vmax:        224.00 cm/s MV E velocity: 62.10 cm/s MV A velocity: 96.00 cm/s  SHUNTS MV E/A ratio:  0.65        Systemic VTI:  0.21 m                            Systemic Diam: 1.90 cm Armanda Magic MD Electronically signed by Armanda Magic MD Signature Date/Time: 03/09/2020/1:10:38 PM    Final    MR BRAIN WO CONTRAST  Final Result    CT ANGIO NECK W OR WO CONTRAST  Final Result    CT ANGIO HEAD W OR WO CONTRAST  Final Result    DG Chest 2 View  Final Result    CT HEAD WO CONTRAST  Final Result      Scheduled Meds: . aspirin EC  325 mg Oral Daily  . atorvastatin  80 mg Oral Daily  . enoxaparin (LOVENOX) injection  40 mg Subcutaneous Q24H   PRN Meds: acetaminophen **OR** acetaminophen (TYLENOL) oral liquid 160 mg/5 mL **OR** acetaminophen, rOPINIRole Continuous Infusions: . cefTRIAXone (ROCEPHIN)  IV       LOS: 1 day  Time spent: Greater than 50% of the 35 minute visit was spent in counseling/coordination of care for the patient as laid out in the A&P.   Lewie Chamber, MD [facility] 03/09/2020, 4:58 PM

## 2020-03-09 NOTE — Evaluation (Addendum)
Physical Therapy Evaluation Patient Details Name: [name] Good MRN: [id number] DOB: 1951/04/22 Today's Date: 03/09/2020   History of Present Illness  69 y.o. 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 Pt presented to the ED with c/o AMS and blurry vision R eye.    Clinical Impression   Pt admitted with above diagnosis. PTA pt lived at home with family, modified independent mobility using cane vs RW. On eval, pt required supervision bed mobility, min guard assist transfers, and min guard assist ambulation 45' with RW. Strength appears symmetrical and WFL. Primary c/o blurry vision R eye. Deficits noted in higher level cognitive functioning. She also appears labile, very tearful at times regarding situation. Pt will benefit from skilled PT to increase their independence and safety with mobility to allow discharge to the venue listed below.      Follow Up Recommendations Home health PT;Supervision/Assistance - 24 hour    Equipment Recommendations  None recommended by PT    Recommendations for Other Services       Precautions / Restrictions Precautions Precautions: Fall      Mobility  Bed Mobility Overal bed mobility: Needs Assistance Bed Mobility: Supine to Sit     Supine to sit: HOB elevated;Supervision     General bed mobility comments: +rail, increased time and effort    Transfers Overall transfer level: Needs assistance Equipment used: Rolling walker (2 wheeled) Transfers: Sit to/from [employer] Sit to Stand: Min guard Stand pivot transfers: Min guard       General transfer comment: cues for hand placement, min guard for safety, increased time and effort  Ambulation/Gait Ambulation/Gait assistance: Min guard Gait Distance (Feet): 45 Feet Assistive device: Rolling walker (2 wheeled) Gait Pattern/deviations: Step-through pattern;Decreased  stride length Gait velocity: decreased Gait velocity interpretation: 1.31 - 2.62 ft/sec, indicative of limited community ambulator General Gait Details: right lean in stance  Stairs            Wheelchair Mobility    Modified Rankin (Stroke Patients Only) Modified Rankin (Stroke Patients Only) Pre-Morbid Rankin Score: Slight disability Modified Rankin: Moderately severe disability     Balance Overall balance assessment: Needs assistance Sitting-balance support: No upper extremity supported;Feet supported Sitting balance-Leahy Scale: Fair     Standing balance support: Bilateral upper extremity supported;During functional activity Standing balance-Leahy Scale: Poor Standing balance comment: reliant on BUE support                             Pertinent Vitals/Pain Pain Assessment: Faces Faces Pain Scale: Hurts a little bit Pain Location: back Pain Descriptors / Indicators: Discomfort Pain Intervention(s): Repositioned    Home Living Family/patient expects to be discharged to:: Private residence Living Arrangements: Spouse/significant other;Children Available Help at Discharge: Family;Available 24 hours/day Type of Home: Mobile home Home Access: Stairs to enter Entrance Stairs-Rails: Left Entrance Stairs-Number of Steps: 3 Home Layout: One level Home Equipment: Cane - single point;Walker - 4 wheels;Other (comment);Walker - 2 wheels Additional Comments: History and home set up taken from chart. Pt is a poor historian.    Prior Function Level of Independence: Independent with assistive device(s)         Comments: RW vs. cane.  Pt reports she still drives, does grocery pick up.     Hand Dominance   Dominant Hand: Right    Extremity/Trunk Assessment   Upper Extremity Assessment  Upper Extremity Assessment: Overall WFL for tasks assessed    Lower Extremity Assessment Lower Extremity Assessment: Overall WFL for tasks assessed    Cervical /  Trunk Assessment Cervical / Trunk Assessment: Other exceptions Cervical / Trunk Exceptions: h/o lumbar fusion  Communication   Communication: No difficulties  Cognition Arousal/Alertness: Awake/alert Behavior During Therapy: WFL for tasks assessed/performed (labile, emotional, tearful) Overall Cognitive Status: No family/caregiver present to determine baseline cognitive functioning Area of Impairment: Attention;Memory;Following commands;Safety/judgement;Problem solving                   Current Attention Level: Selective Memory: Decreased short-term memory Following Commands: Follows one step commands with increased time Safety/Judgement: Decreased awareness of safety   Problem Solving: Difficulty sequencing;Requires verbal cues;Requires tactile cues        General Comments General comments (skin integrity, edema, etc.): VSS on RA. Max HR 103    Exercises     Assessment/Plan    PT Assessment Patient needs continued PT services  PT Problem List Decreased mobility;Decreased safety awareness;Decreased activity tolerance;Decreased balance;Decreased cognition       PT Treatment Interventions DME instruction;Therapeutic activities;Cognitive remediation;Gait training;Therapeutic exercise;Patient/family education;Stair training;Balance training;Functional mobility training    PT Goals (Current goals can be found in the Care Plan section)  Acute Rehab PT Goals Patient Stated Goal: home PT Goal Formulation: With patient Time For Goal Achievement: 03/23/20 Potential to Achieve Goals: Good    Frequency Min 4X/week   Barriers to discharge        Co-evaluation               AM-PAC PT "6 Clicks" Mobility  Outcome Measure Help needed turning from your back to your side while in a flat bed without using bedrails?: None Help needed moving from lying on your back to sitting on the side of a flat bed without using bedrails?: A Little Help needed moving to and from a bed  to a chair (including a wheelchair)?: A Little Help needed standing up from a chair using your arms (e.g., wheelchair or bedside chair)?: A Little Help needed to walk in hospital room?: A Little Help needed climbing 3-5 steps with a railing? : A Lot 6 Click Score: 18    End of Session Equipment Utilized During Treatment: Gait belt Activity Tolerance: Patient tolerated treatment well Patient left: in bed;with call bell/phone within reach;with bed alarm set Nurse Communication: Mobility status PT Visit Diagnosis: Other abnormalities of gait and mobility (R26.89);Difficulty in walking, not elsewhere classified (R26.2)    Time: 1610-9604 PT Time Calculation (min) (ACUTE ONLY): 25 min   Charges:   PT Evaluation $PT Eval Moderate Complexity: 1 Mod PT Treatments $Gait Training: 8-22 mins        Aida Raider, PT  Office # [phone] Pager [phone]   Ilda Foil 03/09/2020, 10:15 AM

## 2020-03-09 NOTE — Progress Notes (Signed)
Discovered IV to be leaking and pt reports "burning" upon shift change rounding. Restarted a new IV in the right forearm. Pt did become tearful and jump during the process initially, pt endorses she has had bad experiences with IV's in the past and a great deal of anxiety surrounding needles. Did offer to stop the procedure and provided education about the need for placement and maintenance of a working IV to continue with IV ABX therapy, but noted that any procedure we do here in the hospital is voluntary and the pt has the right to refuse. Pt verbalizes understanding and agrees to proceed. Provided copious reassurance, pt tolerated the rest of the procedure well, denies complications with the new IV, denies pain or burning at the new IV site.

## 2020-03-09 NOTE — Progress Notes (Signed)
STROKE TEAM PROGRESS NOTE   INTERVAL HISTORY No family is at the bedside.  Pt lying in bed, lethargic, still has right hemianopia, not orientated to month. She had MRI again showed left large PCA infarct.  Per patient, she is compliant with Eliquis.  2D echo showed no LV thrombus, no regional wall motion abnormalities.   OBJECTIVE Vitals:   03/09/20 0130 03/09/20 0200 03/09/20 0230 03/09/20 0258  BP: 134/67 (!) 131/119 127/64 122/78  Pulse: 90 86 91 89  Resp: 17 18 20 16   Temp:   98.1 F (36.7 C) 98.2 F (36.8 C)  TempSrc:   Oral Oral  SpO2: 95% 99% 94% 99%  Weight:      Height:        CBC:  Recent Labs  Lab 03/08/20 1713  WBC 8.5  NEUTROABS 4.9  HGB 14.6  HCT 44.2  MCV 92.7  PLT 244    Basic Metabolic Panel:  Recent Labs  Lab 03/08/20 1713  NA 141  K 3.9  CL 107  CO2 24  GLUCOSE 94  BUN 10  CREATININE 0.80  CALCIUM 9.7    Lipid Panel:     Component Value Date/Time   CHOL 157 03/09/2020 0215   TRIG 87 03/09/2020 0215   HDL 48 03/09/2020 0215   CHOLHDL 3.3 03/09/2020 0215   VLDL 17 03/09/2020 0215   LDLCALC 92 03/09/2020 0215   HgbA1c:  Lab Results  Component Value Date   HGBA1C 5.4 03/09/2020   Urine Drug Screen:     Component Value Date/Time   LABOPIA NONE DETECTED 03/08/2020 1755   COCAINSCRNUR NONE DETECTED 03/08/2020 1755   LABBENZ NONE DETECTED 03/08/2020 1755   AMPHETMU NONE DETECTED 03/08/2020 1755   THCU NONE DETECTED 03/08/2020 1755   LABBARB NONE DETECTED 03/08/2020 1755    Alcohol Level     Component Value Date/Time   ETH <10 03/08/2020 1724    IMAGING  CT ANGIO HEAD W OR WO CONTRAST CT ANGIO NECK W OR WO CONTRAST 03/09/2020 IMPRESSION:  1. Aortic atherosclerosis.  2. Atherosclerotic disease at both carotid bifurcations. 50% stenosis of the distal bulb on the right. No measurable stenosis on the left.  3. Occlusion of the left vertebral artery at its origin with reconstitution in the mid cervical region by cervical  collaterals as a small vessel that does show flow to the foramen magnum. Dominant right vertebral artery widely patent to the basilar.  4. 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  5. Extensive chronic small-vessel ischemic changes throughout the cerebral hemispheric white matter, basal ganglia and thalami.  Aortic Atherosclerosis (ICD10-I70.0). .   CT HEAD WO CONTRAST 03/08/2020 IMPRESSION:  1. Acute to subacute left occipital cortical infarct, new since previous studies.  2. Stable chronic small-vessel ischemic changes throughout the white matter, thalami, and right temporal occipital region.  3. No acute hemorrhage.   MRI HEAD WO CONTRAST 1. Patchy acute left PCA territory infarcts. 2. Severe chronic small vessel ischemic disease with multiple old infarcts as above.  DG Chest 2 View 03/08/2020 IMPRESSION:  No active cardiopulmonary disease  Transthoracic Echocardiogram  1. Left ventricular ejection fraction, by estimation, is 60 to 65%. The  left ventricle has normal function. The left ventricle has no regional  wall motion abnormalities. Left ventricular diastolic parameters are  consistent with Grade I diastolic  dysfunction (impaired relaxation).  2. Right ventricular systolic function is normal. The right ventricular  size is normal. There is normal pulmonary  artery systolic pressure. The  estimated right ventricular systolic pressure is 23.1 mmHg.  3. The mitral valve is normal in structure. No evidence of mitral valve  regurgitation. No evidence of mitral stenosis.  4. The aortic valve was not well visualized. Aortic valve regurgitation  is not visualized. No aortic stenosis is present.  5. The inferior vena cava is normal in size with greater than 50%  respiratory variability, suggesting right atrial pressure of 3 mmHg.   ECG - SR rate 87 BPM. (See cardiology reading for complete details)   PHYSICAL EXAM  Temp:  [98.1 F (36.7 C)-99.1 F (37.3  C)] 99.1 F (37.3 C) (11/13 2023) Pulse Rate:  [83-102] 96 (11/13 2023) Resp:  [16-21] 19 (11/13 2023) BP: (113-134)/(62-119) 132/76 (11/13 2023) SpO2:  [94 %-100 %] 100 % (11/13 2023)  General - Well nourished, well developed, in no apparent distress, but lethargic.  Ophthalmologic - fundi not visualized due to noncooperation.  Cardiovascular - Regular rhythm and rate.  Mental Status -  Level of arousal and orientation to year, place, and person were intact, however not oriented to month. Language including expression, naming, repetition, comprehension was assessed and found intact.  Cranial Nerves II - XII - II - Right hemianopia. III, IV, VI - Extraocular movements intact. V - Facial sensation intact bilaterally. VII - Facial movement intact bilaterally. VIII - Hearing & vestibular intact bilaterally. X - Palate elevates symmetrically. XI - Chin turning & shoulder shrug intact bilaterally. XII - Tongue protrusion intact.  Motor Strength - The patient's strength was symmetrical in all extremities and pronator drift was absent.  Bulk was normal and fasciculations were absent.   Motor Tone - Muscle tone was assessed at the neck and appendages and was normal.  Reflexes - The patient's reflexes were symmetrical in all extremities and she had no pathological reflexes.  Sensory - Light touch, temperature/pinprick were assessed and were symmetrical.    Coordination - The patient had normal movements in the hands with no ataxia or dysmetria.  Tremor was absent.  Gait and Station - deferred.   ASSESSMENT/PLAN [name] is a 69 y.o. 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  She did not receive IV t-PA due to late presentation (>4.5 hours from time of onset)  Stroke: Acute to subacute left PCA infarct with left P1 occlusion - embolic - etiology  unclear  CT head - Acute to subacute left occipital cortical infarct, new since previous studies. Old right temporo-occipital infarct.  CTA H&N - Occlusion of the left vertebral artery at its origin with reconstitution in the mid cervical region by cervical collaterals as a small vessel that does show flow to the foramen magnum. 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   MRI head - left PCA scattered large infarct  2D Echo - EF 60 to 65%, LV no regional wall motion abnormalities  [employer] Virus 2 - negative  LDL - 92  HgbA1c - 5.4  UDS - negative  VTE prophylaxis - Lovenox  Eliquis (apixaban) daily prior to admission, now on ASA 325 mg.  Patient counseled to be compliant with her antithrombotic medications  Ongoing aggressive stroke risk factor management  Therapy recommendations:  pending  Disposition:  Pending  Hx of LV thrombus  09/2018 one 2D echo showed LV thrombus, EF 60 to 65%  This admission 2D echo showed no LV thrombus, EF 60 to 65%, LV no regional wall motion abnormalities  On eliquis PTA  Now on ASA 325 due to large infarct left PCA  Will curbside cardiology to see if TEE needed and recs of anticoagulation regimen.  History of stroke  09/2019 admitted for left-sided weakness.  CT showed right temporal occipital infarct.  MRI showed right thalamic infarct.  CT head and neck right ICA 50% stenosis.  EF 60 to 65% but found to have LV thrombus, put on Eliquis.  LDL 118 and A1c 5.7, discharged with Lipitor 80.  Hyperlipidemia  Home Lipid lowering medication: Lipitor 80 mg daily  LDL 92, goal < 70  Current lipid lowering medication: Lipitor 80 mg daily  Continue statin at discharge  Other Stroke Risk Factors  Advanced age  Former cigarette smoker - quit 6 years ago  Previous ETOH use  Obesity, Body mass index is 30.65 kg/m., recommend weight loss, diet and exercise as appropriate   Family hx stroke (mother)   Coronary artery  disease - prior Mi  Other Active Problems  Code status - Full code   UTI - Rocephin  Aortic Atherosclerosis (ICD10-I70.0).   Hospital day # 1  I discussed with Dr. Frederick Peers. I spent  35 minutes in total face-to-face time with the patient, more than 50% of which was spent in counseling and coordination of care, reviewing test results, images and medication, and discussing the diagnosis, treatment plan and potential prognosis. This patient's care requiresreview of multiple databases, neurological assessment, discussion with family, other specialists and medical decision making of high complexity.  Marvel Plan, MD PhD Stroke Neurology 03/09/2020 9:38 PM  To contact Stroke Continuity provider, please refer to [email or web address]. After hours, contact General Neurology

## 2020-03-09 NOTE — Progress Notes (Signed)
  Echocardiogram 2D Echocardiogram has been performed.  Augustine Radar 03/09/2020, 11:17 AM

## 2020-03-09 NOTE — Assessment & Plan Note (Addendum)
-   eliquis on hold for now - current TTE does not reveal residual LV thrombus - cardiology consult placed for further input; patient will undergo TEE on 11/16 - patient on 325 mg Asa daily otherwise

## 2020-03-09 NOTE — Plan of Care (Signed)
Pt demonstrates appropriate use of call light. Verbalizes understanding of bed alarms, nonskid socks, calling for help, and assistive devices for fall prevention and safety in the hospital. Siderails x3 per pt request. Belongings and call light within reach. Bed locked and low. Clutter free pathway throughout the room.

## 2020-03-09 NOTE — Assessment & Plan Note (Addendum)
-   MRI brain shows new acute Left PCA infarcts (involves occipital, posteromedial temporal lobe, and left thalamus).  Chronic right parieto-occipital infarct noted - CTA head/neck shows occluded left vertebral artery but reconstitution distally and patent right VA; extensive chronic small vessel changes - follow up neuro recs - continue asa 325 mg daily for now -Cardiology consulted given recurrent stroke with previous LV thrombus (not present on current TTE).  Tentative plan is for TEE on 03/12/2020.  Eliquis on hold regardless for procedure, then will need to further decide if patient needs anticoagulation

## 2020-03-09 NOTE — Assessment & Plan Note (Addendum)
-   follow up urine culture: E. Coli - okay to further narrow from rocephin to keflex to finish course on 11/15

## 2020-03-09 NOTE — Assessment & Plan Note (Signed)
-  continue requip

## 2020-03-09 NOTE — Hospital Course (Addendum)
[name] Good is a 69 y.o. 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  Started on Eliquis.  LV thrombus still present on repeat office visit 3 months later per daughter.   Pt reports compliance with eliquis.   Pt in to ED for confusion / AMS onset on Past Sat (6 days).  Loss of vision / blurry vision to the R side.  Had argument with husband on Sat night, headache onset after this.  Laid down to rest and headache resolved, but after this she noticed vision changes and felt confused.   No extremity weakness, no numbness nor tingling in extremities, no speech difficulty.   Daughter reports increasing confusion during phone conversations since Sat.  CT head in the ER revealed an acute to subacute left occipital cortical infarct, new since previous imaging.  Stable chronic small vessel ischemic changes also noted throughout.

## 2020-03-10 LAB — CBC WITH DIFFERENTIAL/PLATELET
Abs Immature Granulocytes: 0.02 10*3/uL (ref 0.00–0.07)
Basophils Absolute: 0 10*3/uL (ref 0.0–0.1)
Basophils Relative: 1 %
Eosinophils Absolute: 0.4 10*3/uL (ref 0.0–0.5)
Eosinophils Relative: 5 %
HCT: 39.1 % (ref 36.0–46.0)
Hemoglobin: 13.1 g/dL (ref 12.0–15.0)
Immature Granulocytes: 0 %
Lymphocytes Relative: 38 %
Lymphs Abs: 2.9 10*3/uL (ref 0.7–4.0)
MCH: 31 pg (ref 26.0–34.0)
MCHC: 33.5 g/dL (ref 30.0–36.0)
MCV: 92.7 fL (ref 80.0–100.0)
Monocytes Absolute: 0.7 10*3/uL (ref 0.1–1.0)
Monocytes Relative: 9 %
Neutro Abs: 3.7 10*3/uL (ref 1.7–7.7)
Neutrophils Relative %: 47 %
Platelets: 235 10*3/uL (ref 150–400)
RBC: 4.22 MIL/uL (ref 3.87–5.11)
RDW: 12.2 % (ref 11.5–15.5)
WBC: 7.8 10*3/uL (ref 4.0–10.5)
nRBC: 0 % (ref 0.0–0.2)

## 2020-03-10 LAB — BASIC METABOLIC PANEL
Anion gap: 9 (ref 5–15)
BUN: 17 mg/dL (ref 8–23)
CO2: 22 mmol/L (ref 22–32)
Calcium: 9.2 mg/dL (ref 8.9–10.3)
Chloride: 108 mmol/L (ref 98–111)
Creatinine, Ser: 0.85 mg/dL (ref 0.44–1.00)
GFR, Estimated: >60 mL/min (ref >60)
Glucose, Bld: 105 mg/dL — ABNORMAL HIGH (ref 70–99)
Potassium: 3.6 mmol/L (ref 3.5–5.1)
Sodium: 139 mmol/L (ref 135–145)

## 2020-03-10 LAB — URINE CULTURE: Culture: >=100000 — AB

## 2020-03-10 LAB — MAGNESIUM: Magnesium: 2.1 mg/dL (ref 1.7–2.4)

## 2020-03-10 MED ORDER — CEPHALEXIN 500 MG PO CAPS
500.0000 mg | ORAL_CAPSULE | Freq: Two times a day (BID) | ORAL | Status: AC
  Administered 2020-03-10 – 2020-03-11 (×3): 500 mg via ORAL
  Filled 2020-03-10 (×3): qty 1

## 2020-03-10 NOTE — Evaluation (Signed)
Occupational Therapy Evaluation Patient Details Name: [name] Good MRN: [id number] DOB: 04-Oct-1950 Today's Date: 03/10/2020    History of Present Illness 68 y.o. 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 Pt presented to the ED with c/o AMS and blurry vision R eye.   Clinical Impression   This 68 y/o female presents with the above. PTA pt reports being mod independent with ADL and functional mobility, living with spouse. Pt currently presenting with the above and below listed deficits including impaired cognition, decreased balance, and notable visuoperceptual deficits. Pt very emotionally labile during session completion with encouragement/comfort provided PRN (asked pt if she would like to speak to chaplain and pt declined at this time). Pt requiring minA for room level mobility using RW, and overall requiring minA for ADL at this time, however requiring up to max cues for locating ADL items in R visual field on sink and for properly utilizing items/sequencing throughout ADL tasks. Given lability pt very distracted and requiring cues for redirection to tasks at hand. She will benefit from continued acute OT services, given today's presentation feel she may benefit from CIR level therapies prior to return home to maximize her overall safety, independence with ADL and mobility.     Follow Up Recommendations  CIR    Equipment Recommendations  Other (comment);None recommended by OT (TBD)    Recommendations for Other Services Rehab consult     Precautions / Restrictions Precautions Precautions: Fall Restrictions Weight Bearing Restrictions: No      Mobility Bed Mobility               General bed mobility comments: OOB in recliner    Transfers Overall transfer level: Needs assistance Equipment used: Rolling walker (2 wheeled) Transfers: Sit to/from Stand Sit to  Stand: Min guard         General transfer comment: close guarding for safety, increased time/effort for pt to rise to standing at RW    Balance Overall balance assessment: Needs assistance Sitting-balance support: Feet supported Sitting balance-Leahy Scale: Fair     Standing balance support: Bilateral upper extremity supported;During functional activity Standing balance-Leahy Scale: Poor Standing balance comment: reliant on bil UE support; use of bil forearm support on sink for grooming ADL                           ADL either performed or assessed with clinical judgement   ADL Overall ADL's : Needs assistance/impaired Eating/Feeding: Set up;Sitting   Grooming: Wash/dry face;Minimal assistance;Standing Grooming Details (indicate cue type and reason): for balance; max cues to locate ADL items in R visual field and to utilize all grooming items (i.e. using toothpaste for oral care) Upper Body Bathing: Min guard;Sitting   Lower Body Bathing: Minimal assistance;Sit to/from stand   Upper Body Dressing : Min guard;Sitting   Lower Body Dressing: Minimal assistance;Sit to/from stand   Toilet Transfer: Minimal assistance;Ambulation;RW   Toileting- Clothing Manipulation and Hygiene: Minimal assistance;Sit to/from stand       Functional mobility during ADLs: Minimal assistance;Rolling walker       Vision Baseline Vision/History: Wears glasses Wears Glasses: Reading only Vision Assessment?: Yes;Vision impaired- to be further tested in functional context Eye Alignment: Within Functional Limits Ocular Range of Motion: Within Functional Limits Alignment/Gaze Preference: Within Defined Limits Tracking/Visual Pursuits: Able to track stimulus in all quads without difficulty Visual Fields: Other (comment);Impaired-to  be further tested in functional context Additional Comments: pt with visuoperceptual deficits, required max multimodalcues to locate grooming items in R  visual field while standing at sink; min cues for items to L. pt becoming tearful/labile during attempts at formal vision assessment so unable to complete in its entirety     Perception Perception Perception Tested?: Yes Perception Deficits: Inattention/neglect Inattention/Neglect: Does not attend to right visual field;Impaired- to be further tested in functional context   Praxis      Pertinent Vitals/Pain Pain Assessment: Faces Faces Pain Scale: No hurt Pain Intervention(s): Monitored during session     Hand Dominance Right   Extremity/Trunk Assessment Upper Extremity Assessment Upper Extremity Assessment: Generalized weakness   Lower Extremity Assessment Lower Extremity Assessment: Defer to PT evaluation   Cervical / Trunk Assessment Cervical / Trunk Assessment: Other exceptions Cervical / Trunk Exceptions: h/o lumbar fusion   Communication Communication Communication: No difficulties   Cognition Arousal/Alertness: Awake/alert Behavior During Therapy: Anxious (emotionally labile ) Overall Cognitive Status: Impaired/Different from baseline Area of Impairment: Attention;Orientation;Memory;Following commands;Awareness;Problem solving                 Orientation Level: Disoriented to;Situation Current Attention Level: Selective Memory: Decreased short-term memory Following Commands: Follows one step commands with increased time Safety/Judgement: Decreased awareness of safety;Decreased awareness of deficits Awareness: Emergent Problem Solving: Difficulty sequencing;Requires verbal cues;Requires tactile cues General Comments: pt very emotionally labile during session making it difficult to fully assess cognition. pt with notable visuoperceptual deficits and requiring multimodal cues for locating items on sink; required cues to use toothpaste when brushing teeth; when asking pt to go sit in recliner post grooming ADL pt initially attempting to reach back for armrest of a  chair while standing at sink however there was no chair currently behind pt; pt notably upset given some awareness of not being able to think clearly   General Comments       Exercises     Shoulder Instructions      Home Living Family/patient expects to be discharged to:: Private residence Living Arrangements: Spouse/significant other;Children Available Help at Discharge: Family;Available 24 hours/day Type of Home: Mobile home Home Access: Stairs to enter Entrance Stairs-Number of Steps: 3 Entrance Stairs-Rails: Left Home Layout: One level     Bathroom Shower/Tub: Chief Strategy Officer: Handicapped height     Home Equipment: Cane - single point;Walker - 4 wheels;Other (comment);Walker - 2 wheels;Bedside commode;Tub bench   Additional Comments: History and home set up taken from chart. Pt is a poor historian.      Prior Functioning/Environment Level of Independence: Independent with assistive device(s)        Comments: RW vs. cane.  Pt reports she still drives, does grocery pick up.        OT Problem List: Decreased strength;Decreased range of motion;Decreased activity tolerance;Impaired balance (sitting and/or standing);Impaired vision/perception;Decreased cognition;Decreased safety awareness;Decreased knowledge of use of DME or AE      OT Treatment/Interventions: Self-care/ADL training;Therapeutic exercise;Energy conservation;DME and/or AE instruction;Neuromuscular education;Therapeutic activities;Cognitive remediation/compensation;Visual/perceptual remediation/compensation;Patient/family education;Balance training    OT Goals(Current goals can be found in the care plan section) Acute Rehab OT Goals Patient Stated Goal: home OT Goal Formulation: With patient Time For Goal Achievement: 03/24/20 Potential to Achieve Goals: Good  OT Frequency: Min 3X/week   Barriers to D/C:            Co-evaluation              AM-PAC OT "6 Clicks" Daily  Activity     Outcome Measure Help from another person eating meals?: A Little Help from another person taking care of personal grooming?: A Little Help from another person toileting, which includes using toliet, bedpan, or urinal?: A Little Help from another person bathing (including washing, rinsing, drying)?: A Lot Help from another person to put on and taking off regular upper body clothing?: A Little Help from another person to put on and taking off regular lower body clothing?: A Lot 6 Click Score: 16   End of Session Equipment Utilized During Treatment: Gait belt;Rolling walker Nurse Communication: Mobility status  Activity Tolerance: Patient tolerated treatment well Patient left: in chair;with call bell/phone within reach;with chair alarm set  OT Visit Diagnosis: Unsteadiness on feet (R26.81);Other symptoms and signs involving the nervous system (R29.898);Other symptoms and signs involving cognitive function                Time: 8032-1224 OT Time Calculation (min): 27 min Charges:  OT General Charges $OT Visit: 1 Visit OT Evaluation $OT Eval Moderate Complexity: 1 Mod OT Treatments $Self Care/Home Management : 8-22 mins  Marcy Siren, OT Acute Rehabilitation Services Pager [phone] Office [phone]   Orlando Penner 03/10/2020, 10:05 AM

## 2020-03-10 NOTE — Progress Notes (Signed)
Pt continues to be confused, attempting to get OOB unassisted, removing tele, removing gown. Pt does repeat that she would like to call her husband, but her cell phone is locked. I offer hospital phone, to which pt replies "no, that won't work. I have to use mine." The pt then asked to go to the bathroom and did well 1x assist with walker ambulating to the bathroom and back to bed. During this time the pts linens were changed to include an additional blanket due to pt c/o being cold. Offered again to help the patient with her cell phone, pt refused. Offered to get her husband's number from emergency contact and help her dial it on the hospital phone, pt again refused. "I'll never be able to hear that thing." Pt denies further needs. Bed locked, low, siderails x2, call light within reach, bed alarm on, blue nonskid socks on the pt and personal belongings including 2 cell phones and eyeglasses are within reach.

## 2020-03-10 NOTE — Progress Notes (Signed)
3382 - received call from tele to let me know pt was off monitor. Discovered pt in room naked, having removed tele. "Who are you?" pt also is unable to tell me where she is and what year it is without extensive prompting. Pt gown changed and tele reapplied.  5053 - bed alarm going off, pt is again naked and removing tele. Reoriented the pt, at this time she remembers she is in the hospital but states "I am trying to figure out why I am in here." Provided reorientation, redressed the pt, and offered explanations of why she was here and offered to answer any questions she has at this time about her care.  At this point, pt became tearful, saying "I just want to call my husband." Helped the pt get set up with one of her two cell phones which are both at bedside, and let her know that it is 1am but I am happy to assist her in getting in touch with her husband or anyone else she may need to contact to notify. Pt denies need for assistance or further questions at this time. Endorses she is comfortable. Bed alarm on, call light within reach, bed locked and low. Pt refusing nonskid socks at this time.

## 2020-03-10 NOTE — Consult Note (Signed)
Referring Physician: Aida Puffer, MD  [name] Good is an 69 y.o. female.                       Chief Complaint: 69 years old female with new stroke.  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 On 03/08/2020 she had confusion and loss of vision post argument with husband. CT scan showed subacute left occipital stroke. She also has UTI. She denies chest pain or palpitations.   Past Medical History:  Diagnosis Date  . Anxiety   . Arthritis   . Depression   . GERD (gastroesophageal reflux disease)   . Myocardial infarction (HCC) 89   no visits to cardiac dr(thomas kelly) since 92  . Stroke (HCC)   . Wound dehiscence    lumbar      Past Surgical History:  Procedure Laterality Date  . APPENDECTOMY     17 yrs  . BACK SURGERY    . BREAST SURGERY Left    cyst  . CARDIAC CATHETERIZATION  89  . CHOLECYSTECTOMY     69 yrs old  . LUMBAR LAMINECTOMY/DECOMPRESSION MICRODISCECTOMY N/A 06/14/2015   Procedure: Lumbar Three-Four,Lumbar Four-Five, Lumbar Five-Sacral One Laminectomy;  Surgeon: Tia Alert, MD;  Location: [facility];  Service: Neurosurgery;  Laterality: N/A;  . LUMBAR WOUND DEBRIDEMENT N/A 07/24/2015   Procedure: lumbar wound revision;  Surgeon: Tia Alert, MD;  Location: [facility];  Service: Neurosurgery;  Laterality: N/A;  . TOTAL HIP ARTHROPLASTY Right 10/25/2015   Procedure: RIGHT TOTAL HIP ARTHROPLASTY ANTERIOR APPROACH;  Surgeon: Kathryne Hitch, MD;  Location: [facility];  Service: Orthopedics;  Laterality: Right;  . TUBAL LIGATION      Family History  Problem Relation Age of Onset  . Stroke Mother   . Clotting disorder Neg Hx    Social History:  reports that she quit smoking about 6 years ago. Her smoking use included cigarettes. She has a 80.00 pack-year smoking history. She has never used smokeless tobacco. She reports previous alcohol use. She reports that she  does not use drugs.  Allergies:  Allergies  Allergen Reactions  . Amoxicillin Other (See Comments)    Tolerated Zosyn Has patient had a PCN reaction causing immediate rash, facial/tongue/throat swelling, SOB or lightheadedness with hypotension: No Has patient had a PCN reaction causing severe rash involving mucus membranes or skin necrosis: No Has patient had a PCN reaction that required hospitalization No Has patient had a PCN reaction occurring within the last 10 years: No If all of the above answers are "NO", then may proceed with Cephalosporin use.    Medications Prior to Admission  Medication Sig Dispense Refill  . apixaban (ELIQUIS) 5 MG TABS tablet Take 1 tablet (5 mg total) by mouth 2 (two) times daily. 60 tablet 1  . atorvastatin (LIPITOR) 80 MG tablet Take 1 tablet (80 mg total) by mouth daily. 30 tablet 0  . oxyCODONE-acetaminophen (PERCOCET/ROXICET) 5-325 MG tablet Take 1 tablet by mouth every 6 (six) hours as needed for moderate pain or severe pain. 20 tablet 0  . rOPINIRole (REQUIP) 3 MG tablet Take 1 tablet (3 mg total) by mouth at bedtime as needed (restless leg). 30 tablet 0  . acetaminophen (TYLENOL) 325 MG tablet Take 2 tablets (650 mg total) by mouth every 4 (four) hours as needed for mild pain (or temp > 37.5 C (  99.5 F)). (Patient not taking: Reported on 03/09/2020)    . calcium carbonate (TUMS EX) 750 MG chewable tablet Chew 1 tablet by mouth 2 (two) times daily as needed for heartburn.    . furosemide (LASIX) 40 MG tablet Take 40 mg by mouth.    . gabapentin (NEURONTIN) 400 MG capsule Take 1 capsule (400 mg total) by mouth 3 (three) times daily. 90 capsule 1  . magnesium oxide (MAG-OX) 400 MG tablet Take 1 tablet (400 mg total) by mouth daily. 30 tablet 0  . tetrahydrozoline 0.05 % ophthalmic solution Place 1 drop into both eyes daily as needed (Dry eyes).      Results for orders placed or performed during the hospital encounter of 03/08/20 (from the past 48  hour(s))  Protime-INR     Status: None   Collection Time: 03/08/20  5:13 PM  Result Value Ref Range   Prothrombin Time 15.0 11.4 - 15.2 seconds   INR 1.2 0.8 - 1.2    Comment: (NOTE) INR goal varies based on device and disease states. Performed at [facility], [address], [place], [place] [address]   APTT     Status: None   Collection Time: 03/08/20  5:13 PM  Result Value Ref Range   aPTT 33 24 - 36 seconds    Comment: Performed at [facility], [address], [place], [place] [address]  CBC     Status: None   Collection Time: 03/08/20  5:13 PM  Result Value Ref Range   WBC 8.5 4.0 - 10.5 K/uL   RBC 4.77 3.87 - 5.11 MIL/uL   Hemoglobin 14.6 12.0 - 15.0 g/dL   HCT 36.6 36 - 46 %   MCV 92.7 80.0 - 100.0 fL   MCH 30.6 26.0 - 34.0 pg   MCHC 33.0 30.0 - 36.0 g/dL   RDW 44.0 34.7 - 42.5 %   Platelets 244 150 - 400 K/uL   nRBC 0.0 0.0 - 0.2 %    Comment: Performed at [facility], [address], [place], [place] [address]  Differential     Status: None   Collection Time: 03/08/20  5:13 PM  Result Value Ref Range   Neutrophils Relative % 58 %   Neutro Abs 4.9 1.7 - 7.7 K/uL   Lymphocytes Relative 29 %   Lymphs Abs 2.5 0.7 - 4.0 K/uL   Monocytes Relative 8 %   Monocytes Absolute 0.7 0.1 - 1.0 K/uL   Eosinophils Relative 4 %   Eosinophils Absolute 0.3 0.0 - 0.5 K/uL   Basophils Relative 1 %   Basophils Absolute 0.1 0.0 - 0.1 K/uL   Immature Granulocytes 0 %   Abs Immature Granulocytes 0.02 0.00 - 0.07 K/uL    Comment: Performed at [facility], [address], [place], [place] [address]  Comprehensive metabolic panel     Status: None   Collection Time: 03/08/20  5:13 PM  Result Value Ref Range   Sodium 141 135 - 145 mmol/L   Potassium 3.9 3.5 - 5.1 mmol/L   Chloride 107 98 - 111 mmol/L   CO2 24 22 - 32 mmol/L   Glucose, Bld 94 70 - 99 mg/dL    Comment: Glucose reference range applies only to samples taken after fasting for at least 8 hours.    BUN 10 8 - 23 mg/dL   Creatinine, Ser 3.29 0.44 - 1.00 mg/dL   Calcium 9.7 8.9 - 51.8 mg/dL  Total Protein 7.1 6.5 - 8.1 g/dL   Albumin 3.9 3.5 - 5.0 g/dL   AST 23 15 - 41 U/L   ALT 21 0 - 44 U/L   Alkaline Phosphatase 96 38 - 126 U/L   Total Bilirubin 0.8 0.3 - 1.2 mg/dL   GFR, Estimated >16>60 >10>60 mL/min    Comment: (NOTE) Calculated using the CKD-EPI Creatinine Equation (2021)    Anion gap 10 5 - 15    Comment: Performed at [facility], [address] [place], [place] [address]  Ethanol     Status: None   Collection Time: 03/08/20  5:24 PM  Result Value Ref Range   Alcohol, Ethyl (B) <10 <10 mg/dL    Comment: (NOTE) Lowest detectable limit for serum alcohol is 10 mg/dL.  For medical purposes only. Performed at [facility], [address] [place], [place] [address]   Urine rapid drug screen (hosp performed)     Status: None   Collection Time: 03/08/20  5:55 PM  Result Value Ref Range   Opiates NONE DETECTED NONE DETECTED   Cocaine NONE DETECTED NONE DETECTED   Benzodiazepines NONE DETECTED NONE DETECTED   Amphetamines NONE DETECTED NONE DETECTED   Tetrahydrocannabinol NONE DETECTED NONE DETECTED   Barbiturates NONE DETECTED NONE DETECTED    Comment: (NOTE) DRUG SCREEN FOR MEDICAL PURPOSES ONLY.  IF CONFIRMATION IS NEEDED FOR ANY PURPOSE, NOTIFY LAB WITHIN 5 DAYS.  LOWEST DETECTABLE LIMITS FOR URINE DRUG SCREEN Drug Class                     Cutoff (ng/mL) Amphetamine and metabolites    1000 Barbiturate and metabolites    200 Benzodiazepine                 200 Tricyclics and metabolites     300 Opiates and metabolites        300 Cocaine and metabolites        300 THC                            50 Performed at [facility], [address] [place], [place] [address]   Urinalysis, Routine w reflex microscopic Urine, Clean Catch     Status: Abnormal   Collection Time: 03/08/20  5:55 PM  Result Value Ref Range   Color, Urine YELLOW YELLOW    APPearance CLEAR CLEAR   Specific Gravity, Urine 1.004 (L) 1.005 - 1.030   pH 7.0 5.0 - 8.0   Glucose, UA NEGATIVE NEGATIVE mg/dL   Hgb urine dipstick SMALL (A) NEGATIVE   Bilirubin Urine NEGATIVE NEGATIVE   Ketones, ur NEGATIVE NEGATIVE mg/dL   Protein, ur NEGATIVE NEGATIVE mg/dL   Nitrite POSITIVE (A) NEGATIVE   Leukocytes,Ua MODERATE (A) NEGATIVE   RBC / HPF 0-5 0 - 5 RBC/hpf   WBC, UA 6-10 0 - 5 WBC/hpf   Bacteria, UA MANY (A) NONE SEEN   Squamous Epithelial / LPF 0-5 0 - 5    Comment: Performed at [facility], [address] [place], [place] [address]  Urine culture     Status: Abnormal   Collection Time: 03/08/20  5:55 PM   Specimen: Urine, Random  Result Value Ref Range   Specimen Description URINE, RANDOM    Special Requests      NONE Performed at [facility], [address] [place], [place] [address]  Culture >=100,000 COLONIES/mL ESCHERICHIA COLI (A)    Report Status 03/10/2020 FINAL    Organism ID, Bacteria ESCHERICHIA COLI (A)       Susceptibility   Escherichia coli - MIC*    AMPICILLIN >=32 RESISTANT Resistant     CEFAZOLIN <=4 SENSITIVE Sensitive     CEFEPIME <=0.12 SENSITIVE Sensitive     CEFTRIAXONE <=0.25 SENSITIVE Sensitive     CIPROFLOXACIN >=4 RESISTANT Resistant     GENTAMICIN <=1 SENSITIVE Sensitive     IMIPENEM <=0.25 SENSITIVE Sensitive     NITROFURANTOIN <=16 SENSITIVE Sensitive     TRIMETH/SULFA >=320 RESISTANT Resistant     AMPICILLIN/SULBACTAM 4 SENSITIVE Sensitive     PIP/TAZO <=4 SENSITIVE Sensitive     * >=100,000 COLONIES/mL ESCHERICHIA COLI  Respiratory Panel by RT PCR (Flu A&B, Covid) - Nasopharyngeal Swab     Status: None   Collection Time: 03/08/20  9:04 PM   Specimen: Nasopharyngeal Swab  Result Value Ref Range   SARS Coronavirus 2 by RT PCR NEGATIVE NEGATIVE    Comment: (NOTE) SARS-CoV-2 target nucleic acids are NOT DETECTED.  The SARS-CoV-2 RNA is generally detectable in upper respiratoy specimens during the  acute phase of infection. The lowest concentration of SARS-CoV-2 viral copies this assay can detect is 131 copies/mL. A negative result does not preclude SARS-Cov-2 infection and should not be used as the sole basis for treatment or other patient management decisions. A negative result may occur with  improper specimen collection/handling, submission of specimen other than nasopharyngeal swab, presence of viral mutation(s) within the areas targeted by this assay, and inadequate number of viral copies (<131 copies/mL). A negative result must be combined with clinical observations, patient history, and epidemiological information. The expected result is Negative.  Fact Sheet for Patients:  https://www.moore.com/  Fact Sheet for Healthcare Providers:  https://www.young.biz/  This test is no t yet approved or cleared by the [place] FDA and  has been authorized for detection and/or diagnosis of SARS-CoV-2 by FDA under an Emergency Use Authorization (EUA). This EUA will remain  in effect (meaning this test can be used) for the duration of the COVID-19 declaration under Section 564(b)(1) of the Act, 21 U.S.C. section 360bbb-3(b)(1), unless the authorization is terminated or revoked sooner.     Influenza A by PCR NEGATIVE NEGATIVE   Influenza B by PCR NEGATIVE NEGATIVE    Comment: (NOTE) The Xpert Xpress SARS-CoV-2/FLU/RSV assay is intended as an aid in  the diagnosis of influenza from Nasopharyngeal swab specimens and  should not be used as a sole basis for treatment. Nasal washings and  aspirates are unacceptable for Xpert Xpress SARS-CoV-2/FLU/RSV  testing.  Fact Sheet for Patients: https://www.moore.com/  Fact Sheet for Healthcare Providers: https://www.young.biz/  This test is not yet approved or cleared by the [place] FDA and  has been authorized for detection and/or diagnosis of  SARS-CoV-2 by  FDA under an Emergency Use Authorization (EUA). This EUA will remain  in effect (meaning this test can be used) for the duration of the  Covid-19 declaration under Section 564(b)(1) of the Act, 21  U.S.C. section 360bbb-3(b)(1), unless the authorization is  terminated or revoked. Performed at [facility], [address] [place], [place] [address]   HIV Antibody (routine testing w rflx)     Status: None   Collection Time: 03/09/20  1:09 AM  Result Value Ref Range   HIV Screen 4th Generation wRfx Non Reactive Non Reactive    Comment: Performed at  [facility], [address] [place] [place], [place] [address]  Hemoglobin A1c     Status: None   Collection Time: 03/09/20  2:15 AM  Result Value Ref Range   Hgb A1c MFr Bld 5.4 4.8 - 5.6 %    Comment: (NOTE) Pre diabetes:          5.7%-6.4%  Diabetes:              >6.4%  Glycemic control for   <7.0% adults with diabetes    Mean Plasma Glucose 108.28 mg/dL    Comment: Performed at [facility], [address] [place], [place] [address]  Lipid panel     Status: None   Collection Time: 03/09/20  2:15 AM  Result Value Ref Range   Cholesterol 157 0 - 200 mg/dL   Triglycerides 87 <098 mg/dL   HDL 48 >11 mg/dL   Total CHOL/HDL Ratio 3.3 RATIO   VLDL 17 0 - 40 mg/dL   LDL Cholesterol 92 0 - 99 mg/dL    Comment:        Total Cholesterol/HDL:CHD Risk Coronary Heart Disease Risk Table                     Men   Women  1/2 Average Risk   3.4   3.3  Average Risk       5.0   4.4  2 X Average Risk   9.6   7.1  3 X Average Risk  23.4   11.0        Use the calculated Patient Ratio above and the CHD Risk Table to determine the patient's CHD Risk.        ATP III CLASSIFICATION (LDL):  <100     mg/dL   Optimal  914-782  mg/dL   Near or Above                    Optimal  130-159  mg/dL   Borderline  956-213  mg/dL   High  >086     mg/dL   Very High Performed at [facility], [address]  [place], [place] [address]   Basic metabolic panel     Status: Abnormal   Collection Time: 03/10/20  1:16 AM  Result Value Ref Range   Sodium 139 135 - 145 mmol/L   Potassium 3.6 3.5 - 5.1 mmol/L   Chloride 108 98 - 111 mmol/L   CO2 22 22 - 32 mmol/L   Glucose, Bld 105 (H) 70 - 99 mg/dL    Comment: Glucose reference range applies only to samples taken after fasting for at least 8 hours.   BUN 17 8 - 23 mg/dL   Creatinine, Ser 9.62 0.44 - 1.00 mg/dL   Calcium 9.2 8.9 - 95.2 mg/dL   GFR, Estimated >84 >13 mL/min    Comment: (NOTE) Calculated using the CKD-EPI Creatinine Equation (2021)    Anion gap 9 5 - 15    Comment: Performed at [facility], [address] [place], [place] [address]  CBC with Differential/Platelet     Status: None   Collection Time: 03/10/20  1:16 AM  Result Value Ref Range   WBC 7.8 4.0 - 10.5 K/uL   RBC 4.22 3.87 - 5.11 MIL/uL   Hemoglobin 13.1 12.0 - 15.0 g/dL   HCT 02.7 36 - 46 %   MCV 92.7 80.0 - 100.0 fL   MCH 31.0  26.0 - 34.0 pg   MCHC 33.5 30.0 - 36.0 g/dL   RDW 16.1 09.6 - 04.5 %   Platelets 235 150 - 400 K/uL   nRBC 0.0 0.0 - 0.2 %   Neutrophils Relative % 47 %   Neutro Abs 3.7 1.7 - 7.7 K/uL   Lymphocytes Relative 38 %   Lymphs Abs 2.9 0.7 - 4.0 K/uL   Monocytes Relative 9 %   Monocytes Absolute 0.7 0.1 - 1.0 K/uL   Eosinophils Relative 5 %   Eosinophils Absolute 0.4 0.0 - 0.5 K/uL   Basophils Relative 1 %   Basophils Absolute 0.0 0.0 - 0.1 K/uL   Immature Granulocytes 0 %   Abs Immature Granulocytes 0.02 0.00 - 0.07 K/uL    Comment: Performed at [facility], [address] [place], [place] [address]  Magnesium     Status: None   Collection Time: 03/10/20  1:16 AM  Result Value Ref Range   Magnesium 2.1 1.7 - 2.4 mg/dL    Comment: Performed at [facility], [address], [place], [place] [address]   CT ANGIO HEAD W OR WO CONTRAST  Result Date: 03/09/2020 CLINICAL DATA:  Blurred vision and dizziness. Likely recent  left occipital stroke by CT. EXAM: CT ANGIOGRAPHY HEAD AND NECK TECHNIQUE: Multidetector CT imaging of the head and neck was performed using the standard protocol during bolus administration of intravenous contrast. Multiplanar CT image reconstructions and MIPs were obtained to evaluate the vascular anatomy. Carotid stenosis measurements (when applicable) are obtained utilizing NASCET criteria, using the distal internal carotid diameter as the denominator. CONTRAST:  50mL OMNIPAQUE IOHEXOL 350 MG/ML SOLN COMPARISON:  Head CT yesterday.  MRI 10/02/2019 FINDINGS: CT HEAD FINDINGS Brain: Brainstem and cerebellum are unremarkable. Acute to subacute infarctions in the left occipital lobe and left parietooccipital junction. Chronic infarction in the right parieto-occipital junction. Extensive chronic small-vessel ischemic changes throughout the cerebral hemispheric white matter, basal ganglia and thalami. No hemorrhage or mass effect. No hydrocephalus or extra-axial collection. Vascular: There is atherosclerotic calcification of the major vessels at the base of the brain. Skull: Negative Sinuses: Mild mucosal inflammatory changes. Orbits: Negative Review of the MIP images confirms the above findings CTA NECK FINDINGS Aortic arch: Aortic atherosclerotic calcification. No aneurysm or dissection. Branching pattern is normal. Right carotid system: Common carotid artery shows atherosclerotic plaque but is sufficiently patent to the bifurcation region. Calcification at the carotid bifurcation and ICA bulb. Minimal diameter at the distal bulb is 2.5 mm. Compared to a more distal cervical ICA diameter of 5 mm, this indicates a 50% stenosis. Left carotid system: Common carotid artery shows scattered plaque but is sufficiently patent to the bifurcation region. Calcified plaque at the carotid bifurcation and ICA bulb. No measurable stenosis. Cervical ICA widely patent. Vertebral arteries: Atherosclerotic change of the right  subclavian artery proximal to the vertebral artery origin but without flow limiting stenosis. Right vertebral artery origin widely patent. Right vertebral artery is the dominant vessel. There is scattered plaque in the cervical region but no significant stenosis. Left vertebral artery is occluded at its origin and reconstitutes in the mid cervical region by cervical collaterals as a small vessel that does show flow to the foramen magnum. Skeleton: Mild cervical spondylosis. Other neck: No soft tissue mass or lymphadenopathy. Upper chest: Mild scarring at the lung apices. Review of the MIP images confirms the above findings CTA HEAD FINDINGS Anterior circulation: Both internal carotid arteries are patent through the skull  base and siphon regions. There is siphon atherosclerotic calcification but no stenosis greater than 30%. The anterior and middle cerebral vessels are patent without large or medium vessel occlusion or correctable proximal stenosis. No aneurysm or vascular malformation. Patent posterior communicating artery on the right. Posterior circulation: Dominant right vertebral artery widely patent to the basilar. Small left vertebral artery shows some flow through the foramen magnum with supply to the left PICA. Minimal flow present in a small distal left vertebral artery. No basilar stenosis. Both superior cerebellar arteries show flow. There is occlusion of the left P1 segment 1 cm from its origin. Left PCA is patent, with some distal vessel atherosclerotic irregularity. Venous sinuses: Patent and normal. Anatomic variants: None other significant. Review of the MIP images confirms the above findings IMPRESSION: 1. Aortic atherosclerosis. 2. Atherosclerotic disease at both carotid bifurcations. 50% stenosis of the distal bulb on the right. No measurable stenosis on the left. 3. Occlusion of the left vertebral artery at its origin with reconstitution in the mid cervical region by cervical collaterals as a  small vessel that does show flow to the foramen magnum. Dominant right vertebral artery widely patent to the basilar. 4. 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 5. Extensive chronic small-vessel ischemic changes throughout the cerebral hemispheric white matter, basal ganglia and thalami. Aortic Atherosclerosis (ICD10-I70.0). Electronically Signed   By: Paulina Fusi M.D.   On: 03/09/2020 09:06   DG Chest 2 View  Result Date: 03/08/2020 CLINICAL DATA:  Blurred vision, dizziness, left occipital infarct EXAM: CHEST - 2 VIEW COMPARISON:  10/16/2017 FINDINGS: The heart size and mediastinal contours are within normal limits. Both lungs are clear. The visualized skeletal structures are unremarkable. IMPRESSION: No active cardiopulmonary disease. Electronically Signed   By: Sharlet Salina M.D.   On: 03/08/2020 21:13   CT HEAD WO CONTRAST  Result Date: 03/08/2020 CLINICAL DATA:  Blurred vision, dizziness EXAM: CT HEAD WITHOUT CONTRAST TECHNIQUE: Contiguous axial images were obtained from the base of the skull through the vertex without intravenous contrast. COMPARISON:  10/01/2019 FINDINGS: Brain: Since the prior exam, hypodensity has developed in the left occipital region, primarily in the subcortical white matter, consistent with acute to subacute infarct. Areas of chronic ischemic change are again seen throughout the periventricular white matter, thalami, and right temporal occipital region. No acute hemorrhage. Lateral ventricles and remaining midline structures are unremarkable. No acute extra-axial fluid collections. No mass effect. Vascular: No hyperdense vessel or unexpected calcification. Skull: Normal. Negative for fracture or focal lesion. Sinuses/Orbits: Chronic mucosal thickening within the left maxillary sinus, right sphenoid sinus, and ethmoid air cells. Other: None. IMPRESSION: 1. Acute to subacute left occipital cortical infarct, new since previous studies. 2. Stable chronic  small-vessel ischemic changes throughout the white matter, thalami, and right temporal occipital region. 3. No acute hemorrhage. These results were called by telephone at the time of interpretation on 03/08/2020 at 7:13pm to provider [facility] , who verbally acknowledged these results. Electronically Signed   By: Sharlet Salina M.D.   On: 03/08/2020 19:16   CT ANGIO NECK W OR WO CONTRAST  Result Date: 03/09/2020 CLINICAL DATA:  Blurred vision and dizziness. Likely recent left occipital stroke by CT. EXAM: CT ANGIOGRAPHY HEAD AND NECK TECHNIQUE: Multidetector CT imaging of the head and neck was performed using the standard protocol during bolus administration of intravenous contrast. Multiplanar CT image reconstructions and MIPs were obtained to evaluate the vascular anatomy. Carotid stenosis measurements (when applicable) are obtained  utilizing NASCET criteria, using the distal internal carotid diameter as the denominator. CONTRAST:  48mL OMNIPAQUE IOHEXOL 350 MG/ML SOLN COMPARISON:  Head CT yesterday.  MRI 10/02/2019 FINDINGS: CT HEAD FINDINGS Brain: Brainstem and cerebellum are unremarkable. Acute to subacute infarctions in the left occipital lobe and left parietooccipital junction. Chronic infarction in the right parieto-occipital junction. Extensive chronic small-vessel ischemic changes throughout the cerebral hemispheric white matter, basal ganglia and thalami. No hemorrhage or mass effect. No hydrocephalus or extra-axial collection. Vascular: There is atherosclerotic calcification of the major vessels at the base of the brain. Skull: Negative Sinuses: Mild mucosal inflammatory changes. Orbits: Negative Review of the MIP images confirms the above findings CTA NECK FINDINGS Aortic arch: Aortic atherosclerotic calcification. No aneurysm or dissection. Branching pattern is normal. Right carotid system: Common carotid artery shows atherosclerotic plaque but is sufficiently patent to the bifurcation  region. Calcification at the carotid bifurcation and ICA bulb. Minimal diameter at the distal bulb is 2.5 mm. Compared to a more distal cervical ICA diameter of 5 mm, this indicates a 50% stenosis. Left carotid system: Common carotid artery shows scattered plaque but is sufficiently patent to the bifurcation region. Calcified plaque at the carotid bifurcation and ICA bulb. No measurable stenosis. Cervical ICA widely patent. Vertebral arteries: Atherosclerotic change of the right subclavian artery proximal to the vertebral artery origin but without flow limiting stenosis. Right vertebral artery origin widely patent. Right vertebral artery is the dominant vessel. There is scattered plaque in the cervical region but no significant stenosis. Left vertebral artery is occluded at its origin and reconstitutes in the mid cervical region by cervical collaterals as a small vessel that does show flow to the foramen magnum. Skeleton: Mild cervical spondylosis. Other neck: No soft tissue mass or lymphadenopathy. Upper chest: Mild scarring at the lung apices. Review of the MIP images confirms the above findings CTA HEAD FINDINGS Anterior circulation: Both internal carotid arteries are patent through the skull base and siphon regions. There is siphon atherosclerotic calcification but no stenosis greater than 30%. The anterior and middle cerebral vessels are patent without large or medium vessel occlusion or correctable proximal stenosis. No aneurysm or vascular malformation. Patent posterior communicating artery on the right. Posterior circulation: Dominant right vertebral artery widely patent to the basilar. Small left vertebral artery shows some flow through the foramen magnum with supply to the left PICA. Minimal flow present in a small distal left vertebral artery. No basilar stenosis. Both superior cerebellar arteries show flow. There is occlusion of the left P1 segment 1 cm from its origin. Left PCA is patent, with some  distal vessel atherosclerotic irregularity. Venous sinuses: Patent and normal. Anatomic variants: None other significant. Review of the MIP images confirms the above findings IMPRESSION: 1. Aortic atherosclerosis. 2. Atherosclerotic disease at both carotid bifurcations. 50% stenosis of the distal bulb on the right. No measurable stenosis on the left. 3. Occlusion of the left vertebral artery at its origin with reconstitution in the mid cervical region by cervical collaterals as a small vessel that does show flow to the foramen magnum. Dominant right vertebral artery widely patent to the basilar. 4. 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 5. Extensive chronic small-vessel ischemic changes throughout the cerebral hemispheric white matter, basal ganglia and thalami. Aortic Atherosclerosis (ICD10-I70.0). Electronically Signed   By: Paulina Fusi M.D.   On: 03/09/2020 09:06   MR BRAIN WO CONTRAST  Result Date: 03/09/2020 CLINICAL DATA:  Confusion and right-sided vision loss/blurry vision.  History of stroke. EXAM: MRI HEAD WITHOUT CONTRAST TECHNIQUE: Multiplanar, multiecho pulse sequences of the brain and surrounding structures were obtained without intravenous contrast. COMPARISON:  Head and neck CTA 03/09/2020.  Head MRI 10/02/2019. FINDINGS: Brain: There are acute left PCA territory infarcts involving the occipital lobe, posteromedial temporal lobe, and left thalamus. A small chronic right parieto-occipital infarct is again noted with a small amount of associated chronic blood products. Patchy to confluent T2 hyperintensities in the cerebral white matter bilaterally, deep gray nuclei, and brainstem are similar to the prior MRI and are nonspecific but compatible with severe chronic small vessel ischemic disease. A nonenlarged partially empty sella is unchanged. There is mild-to-moderate cerebral atrophy. No mass, midline shift, or extra-axial fluid collection is identified. Vascular: Small and poorly  visualized distal left vertebral artery, more fully evaluated on today's CTA. Skull and upper cervical spine: Unremarkable bone marrow signal. Sinuses/Orbits: Bilateral cataract extraction. Left maxillary sinus mucous retention cyst. Mild mucosal thickening in the other paranasal sinuses. Trace left mastoid fluid. Other: None. IMPRESSION: 1. Patchy acute left PCA territory infarcts. 2. Severe chronic small vessel ischemic disease with multiple old infarcts as above. Electronically Signed   By: Sebastian Ache M.D.   On: 03/09/2020 16:50   ECHOCARDIOGRAM COMPLETE  Result Date: 03/09/2020    ECHOCARDIOGRAM REPORT   Patient Name:   [name] Good Date of Exam: 03/09/2020 Medical Rec #:  [id number]      Height:       63.0 in Accession #:    [id number]     Weight:       173.0 lb Date of Birth:  05/05/1950      BSA:          1.818 m Patient Age:    69 years       BP:           122/78 mmHg Patient Gender: F              HR:           84 bpm. Exam Location:  Inpatient Procedure: 2D Echo, Cardiac Doppler and Color Doppler Indications:    Stroke 434.91 / I163.9  History:        Patient has prior history of Echocardiogram examinations, most                 recent 10/02/2019. CHF, Previous Myocardial Infarction and CAD,                 Stroke and COPD, Signs/Symptoms:Dyspnea; Risk                 Factors:Hypertension and Diabetes.  Sonographer:    Eulah Pont RDCS Referring Phys: [id number] JARED M GARDNER IMPRESSIONS  1. Left ventricular ejection fraction, by estimation, is 60 to 65%. The left ventricle has normal function. The left ventricle has no regional wall motion abnormalities. Left ventricular diastolic parameters are consistent with Grade I diastolic dysfunction (impaired relaxation).  2. Right ventricular systolic function is normal. The right ventricular size is normal. There is normal pulmonary artery systolic pressure. The estimated right ventricular systolic pressure is 23.1 mmHg.  3. The mitral valve is normal in  structure. No evidence of mitral valve regurgitation. No evidence of mitral stenosis.  4. The aortic valve was not well visualized. Aortic valve regurgitation is not visualized. No aortic stenosis is present.  5. The inferior vena cava is normal in size with greater than 50% respiratory variability, suggesting right atrial pressure  of 3 mmHg. FINDINGS  Left Ventricle: Left ventricular ejection fraction, by estimation, is 60 to 65%. The left ventricle has normal function. The left ventricle has no regional wall motion abnormalities. The left ventricular internal cavity size was normal in size. There is  no left ventricular hypertrophy. Left ventricular diastolic parameters are consistent with Grade I diastolic dysfunction (impaired relaxation). Normal left ventricular filling pressure. Right Ventricle: The right ventricular size is normal. No increase in right ventricular wall thickness. Right ventricular systolic function is normal. There is normal pulmonary artery systolic pressure. The tricuspid regurgitant velocity is 2.24 m/s, and  with an assumed right atrial pressure of 3 mmHg, the estimated right ventricular systolic pressure is 23.1 mmHg. Left Atrium: Left atrial size was normal in size. Right Atrium: Right atrial size was normal in size. Pericardium: There is no evidence of pericardial effusion. Mitral Valve: The mitral valve is normal in structure. Mild to moderate mitral annular calcification. No evidence of mitral valve regurgitation. No evidence of mitral valve stenosis. Tricuspid Valve: The tricuspid valve is normal in structure. Tricuspid valve regurgitation is trivial. No evidence of tricuspid stenosis. Aortic Valve: The aortic valve was not well visualized. Aortic valve regurgitation is not visualized. No aortic stenosis is present. Pulmonic Valve: The pulmonic valve was normal in structure. Pulmonic valve regurgitation is not visualized. No evidence of pulmonic stenosis. Aorta: The aortic root is  normal in size and structure. Venous: The inferior vena cava is normal in size with greater than 50% respiratory variability, suggesting right atrial pressure of 3 mmHg. IAS/Shunts: No atrial level shunt detected by color flow Doppler.  LEFT VENTRICLE PLAX 2D LVIDd:         4.50 cm  Diastology LVIDs:         3.40 cm  LV e' medial:    5.44 cm/s LV PW:         0.90 cm  LV E/e' medial:  11.4 LV IVS:        1.10 cm  LV e' lateral:   6.53 cm/s LVOT diam:     1.90 cm  LV E/e' lateral: 9.5 LV SV:         60 LV SV Index:   33 LVOT Area:     2.84 cm  RIGHT VENTRICLE RV S prime:     14.50 cm/s TAPSE (M-mode): 2.2 cm LEFT ATRIUM             Index       RIGHT ATRIUM          Index LA diam:        3.20 cm 1.76 cm/m  RA Area:     8.71 cm LA Vol (A2C):   40.6 ml 22.33 ml/m RA Volume:   14.90 ml 8.20 ml/m LA Vol (A4C):   21.4 ml 11.77 ml/m LA Biplane Vol: 31.0 ml 17.05 ml/m  AORTIC VALVE LVOT Vmax:   116.00 cm/s LVOT Vmean:  76.800 cm/s LVOT VTI:    0.210 m  AORTA Ao Root diam: 2.90 cm Ao Asc diam:  3.00 cm MITRAL VALVE               TRICUSPID VALVE MV Area (PHT): 2.24 cm    TR Peak grad:   20.1 mmHg MV Decel Time: 338 msec    TR Vmax:        224.00 cm/s MV E velocity: 62.10 cm/s MV A velocity: 96.00 cm/s  SHUNTS MV E/A ratio:  0.65  Systemic VTI:  0.21 m                            Systemic Diam: 1.90 cm Armanda Magic MD Electronically signed by Armanda Magic MD Signature Date/Time: 03/09/2020/1:10:38 PM    Final     Review Of Systems Constitutional: No fever, chills, positive weight loss, 60 pounds wt. Loss in 6 months. Eyes: Positive vision change, wears glasses. No discharge or pain. Ears: Positive hearing loss, No tinnitus. Respiratory: No asthma, COPD, pneumonias. Positive shortness of breath. No hemoptysis. Cardiovascular: No chest pain, palpitation, leg edema. Gastrointestinal: No nausea, vomiting, diarrhea, constipation. No GI bleed. No hepatitis. Genitourinary: No dysuria, hematuria, kidney stone. No  incontinance. Neurological: Positive headache, stroke, no seizures.  Psychiatry: No psych facility admission for anxiety, depression, suicide. No detox. Skin: No rash. Musculoskeletal: Positive joint pain, no fibromyalgia. Positive neck pain, back pain. Lymphadenopathy: No lymphadenopathy. Hematology: No anemia or easy bruising.   Blood pressure 125/62, pulse 78, temperature 98 F (36.7 C), temperature source Oral, resp. rate 19, height  (1.6 m), weight 78.5 kg, SpO2 100 %. Body mass index is 30.65 kg/m. General appearance: alert, cooperative, appears stated age and no distress Head: Normocephalic, atraumatic. Eyes: Light Brown eyes, pink conjunctiva, corneas clear.  Neck: No adenopathy, no carotid bruit, no JVD, supple, symmetrical, trachea midline and thyroid not enlarged. Resp: Clear to auscultation bilaterally. Cardio: Regular rate and rhythm, S1, S2 normal, II/VI systolic murmur, no click, rub or gallop GI: Soft, non-tender; bowel sounds normal; no organomegaly. Extremities: No edema, cyanosis or clubbing. Skin: Warm and dry.  Neurologic: Alert and oriented X 3, normal strength. Normal coordination and slow gait. Decreased vision, right sided homonymous hemianopsia.  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  Time spent: Review of old records, Lab, x-rays, EKG, other cardiac tests, examination, discussion with patient/Tech and Dr. Octaviano Glow over 70 minutes.  Ricki Rodriguez, MD  03/10/2020, 10:30 AM

## 2020-03-10 NOTE — Progress Notes (Signed)
PROGRESS NOTE    [name] Good   GEX:[id number]  DOB: 06-07-1950  DOA: 03/08/2020     2  PCP: Aida Puffer, MD  CC: AMS, right blurry vision   Hospital Course: [name] Good is a 69 y.o. 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  Started on Eliquis.  LV thrombus still present on repeat office visit 3 months later per daughter.   Pt reports compliance with eliquis.   Pt in to ED for confusion / AMS onset on Past Sat (6 days).  Loss of vision / blurry vision to the R side.  Had argument with husband on Sat night, headache onset after this.  Laid down to rest and headache resolved, but after this she noticed vision changes and felt confused.   No extremity weakness, no numbness nor tingling in extremities, no speech difficulty.   Daughter reports increasing confusion during phone conversations since Sat.  CT head in the ER revealed an acute to subacute left occipital cortical infarct, new since previous imaging.  Stable chronic small vessel ischemic changes also noted throughout.   Interval History:  Patient has been tearful since yesterday about her diagnosis. Vision in right eye still blurry/decreased.   Old records reviewed in assessment of this patient  ROS: Constitutional: negative for chills and fevers, Respiratory: negative for cough, Cardiovascular: negative for chest pain and Gastrointestinal: negative for abdominal pain  Assessment & Plan: * Acute embolic stroke (HCC) - MRI brain shows new acute Left PCA infarcts (involves occipital, posteromedial temporal lobe, and left thalamus).  Chronic right parieto-occipital infarct noted - CTA head/neck shows occluded left vertebral artery but reconstitution distally and patent right VA; extensive chronic small vessel changes - follow up neuro recs - continue asa 325 mg daily for now -Cardiology  consulted given recurrent stroke with previous LV thrombus (not present on current TTE).  Tentative plan is for TEE on 03/12/2020.  Eliquis on hold regardless for procedure, then will need to further decide if patient needs anticoagulation  Left ventricular apical thrombus - eliquis on hold for now - current TTE does not reveal residual LV thrombus - cardiology consult placed for further input; patient will undergo TEE on 11/16 - patient on 325 mg Asa daily otherwise   Acute lower UTI - follow up urine culture: E. Coli - okay to further narrow from rocephin to keflex to finish course  History of restless legs syndrome - continue requip   Antimicrobials: Rocephin  DVT prophylaxis: Lovenox Code Status: Full Family Communication: none present Disposition Plan: Status is: Inpatient  Remains inpatient appropriate because:Ongoing diagnostic testing needed not appropriate for outpatient work up, IV treatments appropriate due to intensity of illness or inability to take PO and Inpatient level of care appropriate due to severity of illness   Dispo: The patient is from: Home              Anticipated d/c is to: pending further PT/OT evals              Anticipated d/c date is: 3 days              Patient currently is not medically stable to d/c.  Objective: Blood pressure 131/67, pulse 74, temperature 97.6 F (36.4 C), temperature source Oral, resp. rate 19, height 5\' 3"  (1.6 m), weight 78.5 kg, SpO2 99 %.  Examination: General  appearance: alert, cooperative and no distress Head: Normocephalic, without obvious abnormality, atraumatic Eyes: EOMI Lungs: clear to auscultation bilaterally Heart: regular rate and rhythm and S1, S2 normal Abdomen: soft, NT, ND, BS present Extremities: no edema Skin: mobility and turgor normal Neurologic: decreased vision in right eye (loss of peripheral vision). 4/5 RUE strength  Consultants:   neuro  Procedures:     Data Reviewed: I have  personally reviewed following labs and imaging studies Results for orders placed or performed during the hospital encounter of 03/08/20 (from the past 24 hour(s))  Basic metabolic panel     Status: Abnormal   Collection Time: 03/10/20  1:16 AM  Result Value Ref Range   Sodium 139 135 - 145 mmol/L   Potassium 3.6 3.5 - 5.1 mmol/L   Chloride 108 98 - 111 mmol/L   CO2 22 22 - 32 mmol/L   Glucose, Bld 105 (H) 70 - 99 mg/dL   BUN 17 8 - 23 mg/dL   Creatinine, Ser 4.09 0.44 - 1.00 mg/dL   Calcium 9.2 8.9 - 81.1 mg/dL   GFR, Estimated >91 >47 mL/min   Anion gap 9 5 - 15  CBC with Differential/Platelet     Status: None   Collection Time: 03/10/20  1:16 AM  Result Value Ref Range   WBC 7.8 4.0 - 10.5 K/uL   RBC 4.22 3.87 - 5.11 MIL/uL   Hemoglobin 13.1 12.0 - 15.0 g/dL   HCT 82.9 36 - 46 %   MCV 92.7 80.0 - 100.0 fL   MCH 31.0 26.0 - 34.0 pg   MCHC 33.5 30.0 - 36.0 g/dL   RDW 56.2 13.0 - 86.5 %   Platelets 235 150 - 400 K/uL   nRBC 0.0 0.0 - 0.2 %   Neutrophils Relative % 47 %   Neutro Abs 3.7 1.7 - 7.7 K/uL   Lymphocytes Relative 38 %   Lymphs Abs 2.9 0.7 - 4.0 K/uL   Monocytes Relative 9 %   Monocytes Absolute 0.7 0.1 - 1.0 K/uL   Eosinophils Relative 5 %   Eosinophils Absolute 0.4 0.0 - 0.5 K/uL   Basophils Relative 1 %   Basophils Absolute 0.0 0.0 - 0.1 K/uL   Immature Granulocytes 0 %   Abs Immature Granulocytes 0.02 0.00 - 0.07 K/uL  Magnesium     Status: None   Collection Time: 03/10/20  1:16 AM  Result Value Ref Range   Magnesium 2.1 1.7 - 2.4 mg/dL    Recent Results (from the past 240 hour(s))  Urine culture     Status: Abnormal   Collection Time: 03/08/20  5:55 PM   Specimen: Urine, Random  Result Value Ref Range Status   Specimen Description URINE, RANDOM  Final   Special Requests   Final    NONE Performed at [facility], [address] [place], [place] [address]    Culture >=100,000 COLONIES/mL ESCHERICHIA COLI (A)  Final   Report Status 03/10/2020  FINAL  Final   Organism ID, Bacteria ESCHERICHIA COLI (A)  Final      Susceptibility   Escherichia coli - MIC*    AMPICILLIN >=32 RESISTANT Resistant     CEFAZOLIN <=4 SENSITIVE Sensitive     CEFEPIME <=0.12 SENSITIVE Sensitive     CEFTRIAXONE <=0.25 SENSITIVE Sensitive     CIPROFLOXACIN >=4 RESISTANT Resistant     GENTAMICIN <=1 SENSITIVE Sensitive     IMIPENEM <=0.25 SENSITIVE Sensitive     NITROFURANTOIN <=16 SENSITIVE Sensitive  TRIMETH/SULFA >=320 RESISTANT Resistant     AMPICILLIN/SULBACTAM 4 SENSITIVE Sensitive     PIP/TAZO <=4 SENSITIVE Sensitive     * >=100,000 COLONIES/mL ESCHERICHIA COLI  Respiratory Panel by RT PCR (Flu A&B, Covid) - Nasopharyngeal Swab     Status: None   Collection Time: 03/08/20  9:04 PM   Specimen: Nasopharyngeal Swab  Result Value Ref Range Status   SARS Coronavirus 2 by RT PCR NEGATIVE NEGATIVE Final    Comment: (NOTE) SARS-CoV-2 target nucleic acids are NOT DETECTED.  The SARS-CoV-2 RNA is generally detectable in upper respiratoy specimens during the acute phase of infection. The lowest concentration of SARS-CoV-2 viral copies this assay can detect is 131 copies/mL. A negative result does not preclude SARS-Cov-2 infection and should not be used as the sole basis for treatment or other patient management decisions. A negative result may occur with  improper specimen collection/handling, submission of specimen other than nasopharyngeal swab, presence of viral mutation(s) within the areas targeted by this assay, and inadequate number of viral copies (<131 copies/mL). A negative result must be combined with clinical observations, patient history, and epidemiological information. The expected result is Negative.  Fact Sheet for Patients:  https://www.moore.com/  Fact Sheet for Healthcare Providers:  https://www.young.biz/  This test is no t yet approved or cleared by the [place] FDA and  has been  authorized for detection and/or diagnosis of SARS-CoV-2 by FDA under an Emergency Use Authorization (EUA). This EUA will remain  in effect (meaning this test can be used) for the duration of the COVID-19 declaration under Section 564(b)(1) of the Act, 21 U.S.C. section 360bbb-3(b)(1), unless the authorization is terminated or revoked sooner.     Influenza A by PCR NEGATIVE NEGATIVE Final   Influenza B by PCR NEGATIVE NEGATIVE Final    Comment: (NOTE) The Xpert Xpress SARS-CoV-2/FLU/RSV assay is intended as an aid in  the diagnosis of influenza from Nasopharyngeal swab specimens and  should not be used as a sole basis for treatment. Nasal washings and  aspirates are unacceptable for Xpert Xpress SARS-CoV-2/FLU/RSV  testing.  Fact Sheet for Patients: https://www.moore.com/  Fact Sheet for Healthcare Providers: https://www.young.biz/  This test is not yet approved or cleared by the [place] FDA and  has been authorized for detection and/or diagnosis of SARS-CoV-2 by  FDA under an Emergency Use Authorization (EUA). This EUA will remain  in effect (meaning this test can be used) for the duration of the  Covid-19 declaration under Section 564(b)(1) of the Act, 21  U.S.C. section 360bbb-3(b)(1), unless the authorization is  terminated or revoked. Performed at [facility], [address], [place], [place] [address]      Radiology Studies: CT ANGIO HEAD W OR WO CONTRAST  Result Date: 03/09/2020 CLINICAL DATA:  Blurred vision and dizziness. Likely recent left occipital stroke by CT. EXAM: CT ANGIOGRAPHY HEAD AND NECK TECHNIQUE: Multidetector CT imaging of the head and neck was performed using the standard protocol during bolus administration of intravenous contrast. Multiplanar CT image reconstructions and MIPs were obtained to evaluate the vascular anatomy. Carotid stenosis measurements (when applicable) are obtained utilizing NASCET  criteria, using the distal internal carotid diameter as the denominator. CONTRAST:  55mL OMNIPAQUE IOHEXOL 350 MG/ML SOLN COMPARISON:  Head CT yesterday.  MRI 10/02/2019 FINDINGS: CT HEAD FINDINGS Brain: Brainstem and cerebellum are unremarkable. Acute to subacute infarctions in the left occipital lobe and left parietooccipital junction. Chronic infarction in the right parieto-occipital junction. Extensive chronic small-vessel ischemic changes  throughout the cerebral hemispheric white matter, basal ganglia and thalami. No hemorrhage or mass effect. No hydrocephalus or extra-axial collection. Vascular: There is atherosclerotic calcification of the major vessels at the base of the brain. Skull: Negative Sinuses: Mild mucosal inflammatory changes. Orbits: Negative Review of the MIP images confirms the above findings CTA NECK FINDINGS Aortic arch: Aortic atherosclerotic calcification. No aneurysm or dissection. Branching pattern is normal. Right carotid system: Common carotid artery shows atherosclerotic plaque but is sufficiently patent to the bifurcation region. Calcification at the carotid bifurcation and ICA bulb. Minimal diameter at the distal bulb is 2.5 mm. Compared to a more distal cervical ICA diameter of 5 mm, this indicates a 50% stenosis. Left carotid system: Common carotid artery shows scattered plaque but is sufficiently patent to the bifurcation region. Calcified plaque at the carotid bifurcation and ICA bulb. No measurable stenosis. Cervical ICA widely patent. Vertebral arteries: Atherosclerotic change of the right subclavian artery proximal to the vertebral artery origin but without flow limiting stenosis. Right vertebral artery origin widely patent. Right vertebral artery is the dominant vessel. There is scattered plaque in the cervical region but no significant stenosis. Left vertebral artery is occluded at its origin and reconstitutes in the mid cervical region by cervical collaterals as a small  vessel that does show flow to the foramen magnum. Skeleton: Mild cervical spondylosis. Other neck: No soft tissue mass or lymphadenopathy. Upper chest: Mild scarring at the lung apices. Review of the MIP images confirms the above findings CTA HEAD FINDINGS Anterior circulation: Both internal carotid arteries are patent through the skull base and siphon regions. There is siphon atherosclerotic calcification but no stenosis greater than 30%. The anterior and middle cerebral vessels are patent without large or medium vessel occlusion or correctable proximal stenosis. No aneurysm or vascular malformation. Patent posterior communicating artery on the right. Posterior circulation: Dominant right vertebral artery widely patent to the basilar. Small left vertebral artery shows some flow through the foramen magnum with supply to the left PICA. Minimal flow present in a small distal left vertebral artery. No basilar stenosis. Both superior cerebellar arteries show flow. There is occlusion of the left P1 segment 1 cm from its origin. Left PCA is patent, with some distal vessel atherosclerotic irregularity. Venous sinuses: Patent and normal. Anatomic variants: None other significant. Review of the MIP images confirms the above findings IMPRESSION: 1. Aortic atherosclerosis. 2. Atherosclerotic disease at both carotid bifurcations. 50% stenosis of the distal bulb on the right. No measurable stenosis on the left. 3. Occlusion of the left vertebral artery at its origin with reconstitution in the mid cervical region by cervical collaterals as a small vessel that does show flow to the foramen magnum. Dominant right vertebral artery widely patent to the basilar. 4. 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 5. Extensive chronic small-vessel ischemic changes throughout the cerebral hemispheric white matter, basal ganglia and thalami. Aortic Atherosclerosis (ICD10-I70.0). Electronically Signed   By: Paulina Fusi M.D.   On:  03/09/2020 09:06   DG Chest 2 View  Result Date: 03/08/2020 CLINICAL DATA:  Blurred vision, dizziness, left occipital infarct EXAM: CHEST - 2 VIEW COMPARISON:  10/16/2017 FINDINGS: The heart size and mediastinal contours are within normal limits. Both lungs are clear. The visualized skeletal structures are unremarkable. IMPRESSION: No active cardiopulmonary disease. Electronically Signed   By: Sharlet Salina M.D.   On: 03/08/2020 21:13   CT HEAD WO CONTRAST  Result Date: 03/08/2020 CLINICAL DATA:  Blurred vision,  dizziness EXAM: CT HEAD WITHOUT CONTRAST TECHNIQUE: Contiguous axial images were obtained from the base of the skull through the vertex without intravenous contrast. COMPARISON:  10/01/2019 FINDINGS: Brain: Since the prior exam, hypodensity has developed in the left occipital region, primarily in the subcortical white matter, consistent with acute to subacute infarct. Areas of chronic ischemic change are again seen throughout the periventricular white matter, thalami, and right temporal occipital region. No acute hemorrhage. Lateral ventricles and remaining midline structures are unremarkable. No acute extra-axial fluid collections. No mass effect. Vascular: No hyperdense vessel or unexpected calcification. Skull: Normal. Negative for fracture or focal lesion. Sinuses/Orbits: Chronic mucosal thickening within the left maxillary sinus, right sphenoid sinus, and ethmoid air cells. Other: None. IMPRESSION: 1. Acute to subacute left occipital cortical infarct, new since previous studies. 2. Stable chronic small-vessel ischemic changes throughout the white matter, thalami, and right temporal occipital region. 3. No acute hemorrhage. These results were called by telephone at the time of interpretation on 03/08/2020 at 7:13pm to provider [facility] , who verbally acknowledged these results. Electronically Signed   By: Sharlet SalinaMichael  Brown M.D.   On: 03/08/2020 19:16   CT ANGIO NECK W OR WO  CONTRAST  Result Date: 03/09/2020 CLINICAL DATA:  Blurred vision and dizziness. Likely recent left occipital stroke by CT. EXAM: CT ANGIOGRAPHY HEAD AND NECK TECHNIQUE: Multidetector CT imaging of the head and neck was performed using the standard protocol during bolus administration of intravenous contrast. Multiplanar CT image reconstructions and MIPs were obtained to evaluate the vascular anatomy. Carotid stenosis measurements (when applicable) are obtained utilizing NASCET criteria, using the distal internal carotid diameter as the denominator. CONTRAST:  50mL OMNIPAQUE IOHEXOL 350 MG/ML SOLN COMPARISON:  Head CT yesterday.  MRI 10/02/2019 FINDINGS: CT HEAD FINDINGS Brain: Brainstem and cerebellum are unremarkable. Acute to subacute infarctions in the left occipital lobe and left parietooccipital junction. Chronic infarction in the right parieto-occipital junction. Extensive chronic small-vessel ischemic changes throughout the cerebral hemispheric white matter, basal ganglia and thalami. No hemorrhage or mass effect. No hydrocephalus or extra-axial collection. Vascular: There is atherosclerotic calcification of the major vessels at the base of the brain. Skull: Negative Sinuses: Mild mucosal inflammatory changes. Orbits: Negative Review of the MIP images confirms the above findings CTA NECK FINDINGS Aortic arch: Aortic atherosclerotic calcification. No aneurysm or dissection. Branching pattern is normal. Right carotid system: Common carotid artery shows atherosclerotic plaque but is sufficiently patent to the bifurcation region. Calcification at the carotid bifurcation and ICA bulb. Minimal diameter at the distal bulb is 2.5 mm. Compared to a more distal cervical ICA diameter of 5 mm, this indicates a 50% stenosis. Left carotid system: Common carotid artery shows scattered plaque but is sufficiently patent to the bifurcation region. Calcified plaque at the carotid bifurcation and ICA bulb. No measurable  stenosis. Cervical ICA widely patent. Vertebral arteries: Atherosclerotic change of the right subclavian artery proximal to the vertebral artery origin but without flow limiting stenosis. Right vertebral artery origin widely patent. Right vertebral artery is the dominant vessel. There is scattered plaque in the cervical region but no significant stenosis. Left vertebral artery is occluded at its origin and reconstitutes in the mid cervical region by cervical collaterals as a small vessel that does show flow to the foramen magnum. Skeleton: Mild cervical spondylosis. Other neck: No soft tissue mass or lymphadenopathy. Upper chest: Mild scarring at the lung apices. Review of the MIP images confirms the above findings CTA HEAD FINDINGS Anterior circulation: Both internal carotid arteries  are patent through the skull base and siphon regions. There is siphon atherosclerotic calcification but no stenosis greater than 30%. The anterior and middle cerebral vessels are patent without large or medium vessel occlusion or correctable proximal stenosis. No aneurysm or vascular malformation. Patent posterior communicating artery on the right. Posterior circulation: Dominant right vertebral artery widely patent to the basilar. Small left vertebral artery shows some flow through the foramen magnum with supply to the left PICA. Minimal flow present in a small distal left vertebral artery. No basilar stenosis. Both superior cerebellar arteries show flow. There is occlusion of the left P1 segment 1 cm from its origin. Left PCA is patent, with some distal vessel atherosclerotic irregularity. Venous sinuses: Patent and normal. Anatomic variants: None other significant. Review of the MIP images confirms the above findings IMPRESSION: 1. Aortic atherosclerosis. 2. Atherosclerotic disease at both carotid bifurcations. 50% stenosis of the distal bulb on the right. No measurable stenosis on the left. 3. Occlusion of the left vertebral artery  at its origin with reconstitution in the mid cervical region by cervical collaterals as a small vessel that does show flow to the foramen magnum. Dominant right vertebral artery widely patent to the basilar. 4. 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 5. Extensive chronic small-vessel ischemic changes throughout the cerebral hemispheric white matter, basal ganglia and thalami. Aortic Atherosclerosis (ICD10-I70.0). Electronically Signed   By: Paulina Fusi M.D.   On: 03/09/2020 09:06   MR BRAIN WO CONTRAST  Result Date: 03/09/2020 CLINICAL DATA:  Confusion and right-sided vision loss/blurry vision. History of stroke. EXAM: MRI HEAD WITHOUT CONTRAST TECHNIQUE: Multiplanar, multiecho pulse sequences of the brain and surrounding structures were obtained without intravenous contrast. COMPARISON:  Head and neck CTA 03/09/2020.  Head MRI 10/02/2019. FINDINGS: Brain: There are acute left PCA territory infarcts involving the occipital lobe, posteromedial temporal lobe, and left thalamus. A small chronic right parieto-occipital infarct is again noted with a small amount of associated chronic blood products. Patchy to confluent T2 hyperintensities in the cerebral white matter bilaterally, deep gray nuclei, and brainstem are similar to the prior MRI and are nonspecific but compatible with severe chronic small vessel ischemic disease. A nonenlarged partially empty sella is unchanged. There is mild-to-moderate cerebral atrophy. No mass, midline shift, or extra-axial fluid collection is identified. Vascular: Small and poorly visualized distal left vertebral artery, more fully evaluated on today's CTA. Skull and upper cervical spine: Unremarkable bone marrow signal. Sinuses/Orbits: Bilateral cataract extraction. Left maxillary sinus mucous retention cyst. Mild mucosal thickening in the other paranasal sinuses. Trace left mastoid fluid. Other: None. IMPRESSION: 1. Patchy acute left PCA territory infarcts. 2. Severe  chronic small vessel ischemic disease with multiple old infarcts as above. Electronically Signed   By: Sebastian Ache M.D.   On: 03/09/2020 16:50   ECHOCARDIOGRAM COMPLETE  Result Date: 03/09/2020    ECHOCARDIOGRAM REPORT   Patient Name:   [name] Good Date of Exam: 03/09/2020 Medical Rec #:  [id number]      Height:       63.0 in Accession #:    [id number]     Weight:       173.0 lb Date of Birth:  January 06, 1951      BSA:          1.818 m Patient Age:    69 years       BP:           122/78 mmHg Patient Gender: F  HR:           84 bpm. Exam Location:  Inpatient Procedure: 2D Echo, Cardiac Doppler and Color Doppler Indications:    Stroke 434.91 / I163.9  History:        Patient has prior history of Echocardiogram examinations, most                 recent 10/02/2019. CHF, Previous Myocardial Infarction and CAD,                 Stroke and COPD, Signs/Symptoms:Dyspnea; Risk                 Factors:Hypertension and Diabetes.  Sonographer:    Eulah Pont RDCS Referring Phys: [id number] JARED M GARDNER IMPRESSIONS  1. Left ventricular ejection fraction, by estimation, is 60 to 65%. The left ventricle has normal function. The left ventricle has no regional wall motion abnormalities. Left ventricular diastolic parameters are consistent with Grade I diastolic dysfunction (impaired relaxation).  2. Right ventricular systolic function is normal. The right ventricular size is normal. There is normal pulmonary artery systolic pressure. The estimated right ventricular systolic pressure is 23.1 mmHg.  3. The mitral valve is normal in structure. No evidence of mitral valve regurgitation. No evidence of mitral stenosis.  4. The aortic valve was not well visualized. Aortic valve regurgitation is not visualized. No aortic stenosis is present.  5. The inferior vena cava is normal in size with greater than 50% respiratory variability, suggesting right atrial pressure of 3 mmHg. FINDINGS  Left Ventricle: Left ventricular ejection  fraction, by estimation, is 60 to 65%. The left ventricle has normal function. The left ventricle has no regional wall motion abnormalities. The left ventricular internal cavity size was normal in size. There is  no left ventricular hypertrophy. Left ventricular diastolic parameters are consistent with Grade I diastolic dysfunction (impaired relaxation). Normal left ventricular filling pressure. Right Ventricle: The right ventricular size is normal. No increase in right ventricular wall thickness. Right ventricular systolic function is normal. There is normal pulmonary artery systolic pressure. The tricuspid regurgitant velocity is 2.24 m/s, and  with an assumed right atrial pressure of 3 mmHg, the estimated right ventricular systolic pressure is 23.1 mmHg. Left Atrium: Left atrial size was normal in size. Right Atrium: Right atrial size was normal in size. Pericardium: There is no evidence of pericardial effusion. Mitral Valve: The mitral valve is normal in structure. Mild to moderate mitral annular calcification. No evidence of mitral valve regurgitation. No evidence of mitral valve stenosis. Tricuspid Valve: The tricuspid valve is normal in structure. Tricuspid valve regurgitation is trivial. No evidence of tricuspid stenosis. Aortic Valve: The aortic valve was not well visualized. Aortic valve regurgitation is not visualized. No aortic stenosis is present. Pulmonic Valve: The pulmonic valve was normal in structure. Pulmonic valve regurgitation is not visualized. No evidence of pulmonic stenosis. Aorta: The aortic root is normal in size and structure. Venous: The inferior vena cava is normal in size with greater than 50% respiratory variability, suggesting right atrial pressure of 3 mmHg. IAS/Shunts: No atrial level shunt detected by color flow Doppler.  LEFT VENTRICLE PLAX 2D LVIDd:         4.50 cm  Diastology LVIDs:         3.40 cm  LV e' medial:    5.44 cm/s LV PW:         0.90 cm  LV E/e' medial:  11.4 LV  IVS:  1.10 cm  LV e' lateral:   6.53 cm/s LVOT diam:     1.90 cm  LV E/e' lateral: 9.5 LV SV:         60 LV SV Index:   33 LVOT Area:     2.84 cm  RIGHT VENTRICLE RV S prime:     14.50 cm/s TAPSE (M-mode): 2.2 cm LEFT ATRIUM             Index       RIGHT ATRIUM          Index LA diam:        3.20 cm 1.76 cm/m  RA Area:     8.71 cm LA Vol (A2C):   40.6 ml 22.33 ml/m RA Volume:   14.90 ml 8.20 ml/m LA Vol (A4C):   21.4 ml 11.77 ml/m LA Biplane Vol: 31.0 ml 17.05 ml/m  AORTIC VALVE LVOT Vmax:   116.00 cm/s LVOT Vmean:  76.800 cm/s LVOT VTI:    0.210 m  AORTA Ao Root diam: 2.90 cm Ao Asc diam:  3.00 cm MITRAL VALVE               TRICUSPID VALVE MV Area (PHT): 2.24 cm    TR Peak grad:   20.1 mmHg MV Decel Time: 338 msec    TR Vmax:        224.00 cm/s MV E velocity: 62.10 cm/s MV A velocity: 96.00 cm/s  SHUNTS MV E/A ratio:  0.65        Systemic VTI:  0.21 m                            Systemic Diam: 1.90 cm Armanda Magic MD Electronically signed by Armanda Magic MD Signature Date/Time: 03/09/2020/1:10:38 PM    Final    MR BRAIN WO CONTRAST  Final Result    CT ANGIO NECK W OR WO CONTRAST  Final Result    CT ANGIO HEAD W OR WO CONTRAST  Final Result    DG Chest 2 View  Final Result    CT HEAD WO CONTRAST  Final Result      Scheduled Meds: . aspirin EC  325 mg Oral Daily  . atorvastatin  80 mg Oral Daily  . cephALEXin  500 mg Oral Q12H  . enoxaparin (LOVENOX) injection  40 mg Subcutaneous Q24H   PRN Meds: acetaminophen **OR** acetaminophen (TYLENOL) oral liquid 160 mg/5 mL **OR** acetaminophen, rOPINIRole Continuous Infusions:    LOS: 2 days  Time spent: Greater than 50% of the 35 minute visit was spent in counseling/coordination of care for the patient as laid out in the A&P.   Lewie Chamber, MD [facility] 03/10/2020, 2:55 PM

## 2020-03-10 NOTE — Progress Notes (Signed)
SLP Cancellation Note  Patient Details Name: [name] Good MRN: [id number] DOB: [birth year]   Cancelled treatment:       Reason Eval/Treat Not Completed: Patient at procedure or test/unavailable (Pt currently with MD. SLP will follow up. )  Yvone Neu I. Vear Clock, MS, CCC-SLP Acute Rehabilitation Services Office number [phone] Pager [phone]  Scheryl Marten 03/10/2020, 3:45 PM

## 2020-03-10 NOTE — Progress Notes (Signed)
Report given to Seward Speck, charge nurse on duty, due to oncoming nurse expected to be late this AM. Marylene Land denies further questions or needs regarding the pt, Pt is currently resting peacefully in bed with siderails x2, bed alarm, nonskid socks, personal items within reach. No discomfort or distress noted.

## 2020-03-10 NOTE — Evaluation (Signed)
Speech Language Pathology Evaluation Patient Details Name: [name] Good MRN: [id number] DOB: 06-07-1950 Today's Date: 03/10/2020 Time: 6237-6283 SLP Time Calculation (min) (ACUTE ONLY): 24 min  Problem List:  Patient Active Problem List   Diagnosis Date Noted  . Acute embolic stroke (HCC) 03/08/2020  . 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 Acute lower UTI 03/08/2020  . Right thalamic infarction (HCC) 10/05/2019  . Severe dehydration 10/02/2019  . Grief reaction 10/02/2019  . RLS (restless legs syndrome) 10/02/2019  . Acute CVA (cerebrovascular accident) (HCC) 10/01/2019  . Hypokalemia 10/01/2019  . Cellulitis of left upper extremity 10/18/2017  . Arthritis 10/18/2017  . Septic shock (HCC) 10/16/2017  . Former smoker 11/13/2016  . History of restless legs syndrome 11/13/2016  . Nontraumatic incomplete tear of right rotator cuff 08/18/2016  . Autoimmune disease (HCC) 08/12/2016  . Primary osteoarthritis of both knees 08/12/2016  . Primary osteoarthritis of both feet 08/12/2016  . Obesity (BMI 35.0-39.9 without comorbidity) 08/12/2016  . Osteoarthritis of right hip 10/25/2015  . 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 S/P lumbar laminectomy 06/14/2015   Past Medical History:  Past Medical History:  Diagnosis Date  . Anxiety   . Arthritis   . Depression   . GERD (gastroesophageal reflux disease)   . Myocardial infarction (HCC) 89   no visits to cardiac dr(thomas kelly) since 92  . Stroke (HCC)   . Wound dehiscence    lumbar   Past Surgical History:  Past Surgical History:  Procedure Laterality Date  . APPENDECTOMY     17 yrs  . BACK SURGERY    . BREAST SURGERY Left    cyst  . CARDIAC CATHETERIZATION  89  . CHOLECYSTECTOMY     69 yrs old  . LUMBAR LAMINECTOMY/DECOMPRESSION MICRODISCECTOMY N/A 06/14/2015   Procedure: Lumbar Three-Four,Lumbar Four-Five, Lumbar Five-Sacral One Laminectomy;  Surgeon: Tia Alert, MD;  Location: [facility];  Service: Neurosurgery;  Laterality: N/A;  . LUMBAR WOUND DEBRIDEMENT N/A 07/24/2015   Procedure: lumbar wound revision;  Surgeon: Tia Alert, MD;  Location: [facility];  Service: Neurosurgery;  Laterality: N/A;  . TOTAL HIP ARTHROPLASTY Right 10/25/2015   Procedure: RIGHT TOTAL HIP ARTHROPLASTY ANTERIOR APPROACH;  Surgeon: Kathryne Hitch, MD;  Location: [facility] ORS;  Service: Orthopedics;  Laterality: Right;  . TUBAL LIGATION     HPI:  Pt is a 69 y.o. female with medical history significant of prior MI, cardioembolic stroke in June 2021, and prior lumbar fusion who presented to the ED with c/o AMS and blurry vision R eye. MRI brain: Patchy acute left PCA territory infarcts. Pt does have a history of cognitive-linguistic impairments and received inpatient rehab SLP services for cognition in June. At discharge she scored 29/30 on MoCA 7.2 but demonstrated impairments in complex problem solving with organization and error awareness.    Assessment / Plan / Recommendation Clinical Impression  Pt participated in speech/language/cognition evaluation with her daughter present. Per the pt, she resides with her husband and was independent prior to admission. 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 Pt reported that she had mild intermittent difficulty with memory at baseline but denied any other baseline impairments in speech, language or cognition. Pt reported that since admission she has felt confused and that her mind has been "foggy". The [facility] Mental Status Examination was completed to evaluate the pt's cognitive-linguistic skills. She achieved a score of 13/30  which is below the normal limits of 27 or more out of 30. She exhibited difficulty in the areas of awareness, attention, memory, processing speed, problem solving, and executive function. No speech/language deficits were noted, but pt's impairments in attention and memory will likely  impact her communication. Skilled SLP services are clinically indicated at this time to improve cognitive-linguistic function.    SLP Assessment  SLP Recommendation/Assessment: Patient needs continued Speech Lanaguage Pathology Services SLP Visit Diagnosis: Cognitive communication deficit (R41.841)    Follow Up Recommendations  Home health SLP    Frequency and Duration min 2x/week  2 weeks      SLP Evaluation Cognition  Overall Cognitive Status: Impaired/Different from baseline Arousal/Alertness: Awake/alert Orientation Level: Oriented to person;Oriented to place;Oriented to situation (Date provided, Sun, Mar 09, 2020) Attention: Focused;Sustained Focused Attention: Impaired Focused Attention Impairment: Verbal complex Sustained Attention: Impaired Sustained Attention Impairment: Verbal complex Memory: Impaired Memory Impairment: Retrieval deficit;Decreased recall of new information (Immediate: 5/5; delayed: 0/5; with cues: 3/5) Awareness: Impaired Awareness Impairment: Emergent impairment Problem Solving: Impaired Problem Solving Impairment: Verbal complex Executive Function: Reasoning;Sequencing;Organizing Reasoning: Impaired Reasoning Impairment: Verbal complex Sequencing: Impaired Sequencing Impairment: Verbal complex (Clock drawing: 0/4) Organizing:  (Mental manipulation: 2/2)       Comprehension  Auditory Comprehension Overall Auditory Comprehension: Appears within functional limits for tasks assessed Yes/No Questions: Within Functional Limits Commands: Within Functional Limits Conversation: Complex Reading Comprehension Reading Status: Not tested    Expression Expression Primary Mode of Expression: Verbal Verbal Expression Overall Verbal Expression: Appears within functional limits for tasks assessed Initiation: No impairment Level of Generative/Spontaneous Verbalization: Conversation Repetition: No impairment Naming: No impairment Pragmatics: No  impairment   Oral / Motor  Oral Motor/Sensory Function Overall Oral Motor/Sensory Function: Within functional limits Motor Speech Overall Motor Speech: Appears within functional limits for tasks assessed Respiration: Within functional limits Phonation: Normal Resonance: Within functional limits Articulation: Within functional limitis Intelligibility: Intelligible Motor Planning: Witnin functional limits   Teiara Baria I. Vear Clock, MS, CCC-SLP Acute Rehabilitation Services Office number [phone] Pager [phone]                    Scheryl Marten 03/10/2020, 4:59 PM

## 2020-03-10 NOTE — Progress Notes (Addendum)
STROKE TEAM PROGRESS NOTE   INTERVAL HISTORY Daughter is at the bedside.  Pt lying in bed, more awake alert and interactive, still has right hemianopia, but right lower quadrant able to see finger waving. 2D echo showed no LV thrombus, no regional wall motion abnormalities.  Consulted Dr. Algie Coffer cardiology, will do TEE next week.   OBJECTIVE Vitals:   03/09/20 1735 03/09/20 2023 03/09/20 2351 03/10/20 0330  BP: 129/72 132/76 124/76 120/63  Pulse: 92 96 94 85  Resp: 20 19 19 18   Temp: 98.1 F (36.7 C) 99.1 F (37.3 C) 98.7 F (37.1 C) 98.5 F (36.9 C)  TempSrc: Oral Oral Oral Oral  SpO2: 98% 100% 98% 98%  Weight:      Height:        CBC:  Recent Labs  Lab 03/08/20 1713 03/10/20 0116  WBC 8.5 7.8  NEUTROABS 4.9 3.7  HGB 14.6 13.1  HCT 44.2 39.1  MCV 92.7 92.7  PLT 244 235    Basic Metabolic Panel:  Recent Labs  Lab 03/08/20 1713 03/10/20 0116  NA 141 139  K 3.9 3.6  CL 107 108  CO2 24 22  GLUCOSE 94 105*  BUN 10 17  CREATININE 0.80 0.85  CALCIUM 9.7 9.2  MG  --  2.1    Lipid Panel:     Component Value Date/Time   CHOL 157 03/09/2020 0215   TRIG 87 03/09/2020 0215   HDL 48 03/09/2020 0215   CHOLHDL 3.3 03/09/2020 0215   VLDL 17 03/09/2020 0215   LDLCALC 92 03/09/2020 0215   HgbA1c:  Lab Results  Component Value Date   HGBA1C 5.4 03/09/2020   Urine Drug Screen:     Component Value Date/Time   LABOPIA NONE DETECTED 03/08/2020 1755   COCAINSCRNUR NONE DETECTED 03/08/2020 1755   LABBENZ NONE DETECTED 03/08/2020 1755   AMPHETMU NONE DETECTED 03/08/2020 1755   THCU NONE DETECTED 03/08/2020 1755   LABBARB NONE DETECTED 03/08/2020 1755    Alcohol Level     Component Value Date/Time   ETH <10 03/08/2020 1724    IMAGING  CT ANGIO HEAD W OR WO CONTRAST CT ANGIO NECK W OR WO CONTRAST 03/09/2020 IMPRESSION:  1. Aortic atherosclerosis.  2. Atherosclerotic disease at both carotid bifurcations. 50% stenosis of the distal bulb on the right. No  measurable stenosis on the left.  3. Occlusion of the left vertebral artery at its origin with reconstitution in the mid cervical region by cervical collaterals as a small vessel that does show flow to the foramen magnum. Dominant right vertebral artery widely patent to the basilar.  4. 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  5. Extensive chronic small-vessel ischemic changes throughout the cerebral hemispheric white matter, basal ganglia and thalami.  Aortic Atherosclerosis (ICD10-I70.0). .   CT HEAD WO CONTRAST 03/08/2020 IMPRESSION:  1. Acute to subacute left occipital cortical infarct, new since previous studies.  2. Stable chronic small-vessel ischemic changes throughout the white matter, thalami, and right temporal occipital region.  3. No acute hemorrhage.   MRI HEAD WO CONTRAST 1. Patchy acute left PCA territory infarcts. 2. Severe chronic small vessel ischemic disease with multiple old infarcts as above.  DG Chest 2 View 03/08/2020 IMPRESSION:  No active cardiopulmonary disease  Transthoracic Echocardiogram  1. Left ventricular ejection fraction, by estimation, is 60 to 65%. The  left ventricle has normal function. The left ventricle has no regional  wall motion abnormalities. Left ventricular diastolic parameters are  consistent with Grade I diastolic  dysfunction (impaired relaxation).  2. Right ventricular systolic function is normal. The right ventricular  size is normal. There is normal pulmonary artery systolic pressure. The  estimated right ventricular systolic pressure is 23.1 mmHg.  3. The mitral valve is normal in structure. No evidence of mitral valve  regurgitation. No evidence of mitral stenosis.  4. The aortic valve was not well visualized. Aortic valve regurgitation  is not visualized. No aortic stenosis is present.  5. The inferior vena cava is normal in size with greater than 50%  respiratory variability, suggesting right atrial  pressure of 3 mmHg.   ECG - SR rate 87 BPM. (See cardiology reading for complete details)   PHYSICAL EXAM  Temp:  [98.1 F (36.7 C)-99.1 F (37.3 C)] 98.5 F (36.9 C) (11/14 0330) Pulse Rate:  [83-96] 85 (11/14 0330) Resp:  [18-20] 18 (11/14 0330) BP: (102-132)/(58-76) 120/63 (11/14 0330) SpO2:  [97 %-100 %] 98 % (11/14 0330)  General - Well nourished, well developed, in no apparent distress.  Ophthalmologic - fundi not visualized due to noncooperation.  Cardiovascular - Regular rhythm and rate.  Mental Status -  Level of arousal and orientation to year, place, and person were intact, however not oriented to month. Language including expression, naming, repetition, comprehension was assessed and found intact.  Cranial Nerves II - XII - II - Right hemianopia, but able to see hand waving on the right lower quadrant today. III, IV, VI - Extraocular movements intact. V - Facial sensation intact bilaterally. VII - Facial movement intact bilaterally. VIII - Hearing & vestibular intact bilaterally. X - Palate elevates symmetrically. XI - Chin turning & shoulder shrug intact bilaterally. XII - Tongue protrusion intact.  Motor Strength - The patient's strength was symmetrical in all extremities and pronator drift was absent.  Bulk was normal and fasciculations were absent.   Motor Tone - Muscle tone was assessed at the neck and appendages and was normal.  Reflexes - The patient's reflexes were symmetrical in all extremities and she had no pathological reflexes.  Sensory - Light touch, temperature/pinprick were assessed and were symmetrical.    Coordination - The patient had normal movements in the hands with no ataxia or dysmetria.  Tremor was absent.  Gait and Station - deferred.   ASSESSMENT/PLAN [name] is a 69 y.o. female with history of anxiety, Depression, GERD, MI, prior R occipital Stroke, hx of L apical thrombus on eliquis who presents with right sided  vision loss x 6 days.  She did not receive IV t-PA due to late presentation (>4.5 hours from time of onset)  Stroke: Acute to subacute left PCA infarct with left P1 occlusion - embolic - etiology unclear  CT head - Acute to subacute left occipital cortical infarct, new since previous studies. Old right temporo-occipital infarct.  CTA H&N - Occlusion of the left vertebral artery at its origin with reconstitution in the mid cervical region by cervical collaterals as a small vessel that does show flow to the foramen magnum. 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   MRI head - left PCA scattered large infarct  2D Echo - EF 60 to 65%, LV no regional wall motion abnormalities  [employer] Virus 2 - negative  LDL - 92  HgbA1c - 5.4  UDS - negative  VTE prophylaxis - Lovenox  Eliquis (apixaban) daily prior to admission, now on ASA 325 mg.   Patient counseled to  be compliant with her antithrombotic medications  Ongoing aggressive stroke risk factor management  Therapy recommendations: CIR  Disposition:  Pending  Hx of LV thrombus  09/2018 one 2D echo showed LV thrombus, EF 60 to 65%  This admission 2D echo showed no LV thrombus, EF 60 to 65%, LV no regional wall motion abnormalities  On eliquis PTA  Now on ASA 325 due to large infarct left PCA  Dr. Algie Coffer from cardiology on board, will do TEE next week  History of stroke  09/2019 admitted for left-sided weakness.  CT showed right temporal occipital infarct.  MRI showed right thalamic infarct.  CT head and neck right ICA 50% stenosis.  EF 60 to 65% but found to have LV thrombus, put on Eliquis.  LDL 118 and A1c 5.7, discharged with Lipitor 80.  Hyperlipidemia  Home Lipid lowering medication: Lipitor 80 mg daily  LDL 92, goal < 70  Current lipid lowering medication: Lipitor 80 mg daily  Continue statin at discharge  Other Stroke Risk Factors  Advanced age  Former cigarette smoker - quit 6 years  ago  Previous ETOH use  Obesity, Body mass index is 30.65 kg/m., recommend weight loss, diet and exercise as appropriate   Family hx stroke (mother)   Coronary artery disease - prior Mi  Other Active Problems  Code status - Full code   UTI - Rocephin  Aortic Atherosclerosis (ICD10-I70.0).   Hospital day # 2  I had long discussion with daughter and patient at bedside, updated pt current condition, treatment plan and potential prognosis, and answered all the questions.  They expressed understanding and appreciation.    Marvel Plan, MD PhD Stroke Neurology 03/10/2020 6:29 PM   To contact Stroke Continuity provider, please refer to [email or web address]. After hours, contact General Neurology

## 2020-03-11 LAB — BASIC METABOLIC PANEL
Anion gap: 8 (ref 5–15)
BUN: 15 mg/dL (ref 8–23)
CO2: 23 mmol/L (ref 22–32)
Calcium: 9.1 mg/dL (ref 8.9–10.3)
Chloride: 107 mmol/L (ref 98–111)
Creatinine, Ser: 0.85 mg/dL (ref 0.44–1.00)
GFR, Estimated: >60 mL/min (ref >60)
Glucose, Bld: 110 mg/dL — ABNORMAL HIGH (ref 70–99)
Potassium: 3.8 mmol/L (ref 3.5–5.1)
Sodium: 138 mmol/L (ref 135–145)

## 2020-03-11 LAB — CBC WITH DIFFERENTIAL/PLATELET
Abs Immature Granulocytes: 0.02 10*3/uL (ref 0.00–0.07)
Basophils Absolute: 0.1 10*3/uL (ref 0.0–0.1)
Basophils Relative: 1 %
Eosinophils Absolute: 0.4 10*3/uL (ref 0.0–0.5)
Eosinophils Relative: 5 %
HCT: 40.5 % (ref 36.0–46.0)
Hemoglobin: 13.6 g/dL (ref 12.0–15.0)
Immature Granulocytes: 0 %
Lymphocytes Relative: 41 %
Lymphs Abs: 2.7 10*3/uL (ref 0.7–4.0)
MCH: 30.4 pg (ref 26.0–34.0)
MCHC: 33.6 g/dL (ref 30.0–36.0)
MCV: 90.6 fL (ref 80.0–100.0)
Monocytes Absolute: 0.6 10*3/uL (ref 0.1–1.0)
Monocytes Relative: 8 %
Neutro Abs: 2.9 10*3/uL (ref 1.7–7.7)
Neutrophils Relative %: 45 %
Platelets: 263 10*3/uL (ref 150–400)
RBC: 4.47 MIL/uL (ref 3.87–5.11)
RDW: 12.1 % (ref 11.5–15.5)
WBC: 6.5 10*3/uL (ref 4.0–10.5)
nRBC: 0 % (ref 0.0–0.2)

## 2020-03-11 LAB — MAGNESIUM: Magnesium: 2 mg/dL (ref 1.7–2.4)

## 2020-03-11 MED ORDER — ROPINIROLE HCL 1 MG PO TABS: 3.0000 mg | ORAL_TABLET | Freq: Every day | ORAL | Status: DC

## 2020-03-11 MED ORDER — SODIUM CHLORIDE 0.9 % IV SOLN: INTRAVENOUS | Status: DC

## 2020-03-11 NOTE — Care Management Important Message (Signed)
Important Message  Patient Details  Name: [name] Good MRN: [id number] Date of Birth: 1950-12-29   Medicare Important Message Given:  Yes     Dorena Bodo 03/11/2020, 3:28 PM

## 2020-03-11 NOTE — Progress Notes (Signed)
STROKE TEAM PROGRESS NOTE   INTERVAL HISTORY No family is at the bedside.  Pt sitting in chair, stated that she has RLS, on requip at home PRN. She would like to have it now. Will give. Pending TEE in am.   OBJECTIVE Vitals:   03/11/20 0019 03/11/20 0334 03/11/20 0731 03/11/20 1112  BP: 120/67 131/69 108/72 114/86  Pulse: 91 77 85 88  Resp: 19 18 20 20   Temp: 98.2 F (36.8 C) 98.2 F (36.8 C) 97.6 F (36.4 C) 98 F (36.7 C)  TempSrc: Oral Oral Oral Oral  SpO2: 99% 99% 96% 97%  Weight:      Height:        CBC:  Recent Labs  Lab 03/10/20 0116 03/11/20 0430  WBC 7.8 6.5  NEUTROABS 3.7 2.9  HGB 13.1 13.6  HCT 39.1 40.5  MCV 92.7 90.6  PLT 235 263    Basic Metabolic Panel:  Recent Labs  Lab 03/10/20 0116 03/11/20 0430  NA 139 138  K 3.6 3.8  CL 108 107  CO2 22 23  GLUCOSE 105* 110*  BUN 17 15  CREATININE 0.85 0.85  CALCIUM 9.2 9.1  MG 2.1 2.0    Lipid Panel:     Component Value Date/Time   CHOL 157 03/09/2020 0215   TRIG 87 03/09/2020 0215   HDL 48 03/09/2020 0215   CHOLHDL 3.3 03/09/2020 0215   VLDL 17 03/09/2020 0215   LDLCALC 92 03/09/2020 0215   HgbA1c:  Lab Results  Component Value Date   HGBA1C 5.4 03/09/2020   Urine Drug Screen:     Component Value Date/Time   LABOPIA NONE DETECTED 03/08/2020 1755   COCAINSCRNUR NONE DETECTED 03/08/2020 1755   LABBENZ NONE DETECTED 03/08/2020 1755   AMPHETMU NONE DETECTED 03/08/2020 1755   THCU NONE DETECTED 03/08/2020 1755   LABBARB NONE DETECTED 03/08/2020 1755    Alcohol Level     Component Value Date/Time   ETH <10 03/08/2020 1724    IMAGING  CT ANGIO HEAD W OR WO CONTRAST CT ANGIO NECK W OR WO CONTRAST 03/09/2020 IMPRESSION:  1. Aortic atherosclerosis.  2. Atherosclerotic disease at both carotid bifurcations. 50% stenosis of the distal bulb on the right. No measurable stenosis on the left.  3. Occlusion of the left vertebral artery at its origin with reconstitution in the mid cervical  region by cervical collaterals as a small vessel that does show flow to the foramen magnum. Dominant right vertebral artery widely patent to the basilar.  4. 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  5. Extensive chronic small-vessel ischemic changes throughout the cerebral hemispheric white matter, basal ganglia and thalami.  Aortic Atherosclerosis (ICD10-I70.0). .   CT HEAD WO CONTRAST 03/08/2020 IMPRESSION:  1. Acute to subacute left occipital cortical infarct, new since previous studies.  2. Stable chronic small-vessel ischemic changes throughout the white matter, thalami, and right temporal occipital region.  3. No acute hemorrhage.   MRI HEAD WO CONTRAST 1. Patchy acute left PCA territory infarcts. 2. Severe chronic small vessel ischemic disease with multiple old infarcts as above.  DG Chest 2 View 03/08/2020 IMPRESSION:  No active cardiopulmonary disease  Transthoracic Echocardiogram  1. Left ventricular ejection fraction, by estimation, is 60 to 65%. The  left ventricle has normal function. The left ventricle has no regional  wall motion abnormalities. Left ventricular diastolic parameters are  consistent with Grade I diastolic  dysfunction (impaired relaxation).  2. Right ventricular systolic function is  normal. The right ventricular  size is normal. There is normal pulmonary artery systolic pressure. The  estimated right ventricular systolic pressure is 23.1 mmHg.  3. The mitral valve is normal in structure. No evidence of mitral valve  regurgitation. No evidence of mitral stenosis.  4. The aortic valve was not well visualized. Aortic valve regurgitation  is not visualized. No aortic stenosis is present.  5. The inferior vena cava is normal in size with greater than 50%  respiratory variability, suggesting right atrial pressure of 3 mmHg.   ECG - SR rate 87 BPM. (See cardiology reading for complete details)   PHYSICAL EXAM  Temp:  [97.6 F (36.4  C)-98.2 F (36.8 C)] 98 F (36.7 C) (11/15 1112) Pulse Rate:  [77-91] 88 (11/15 1112) Resp:  [18-20] 20 (11/15 1112) BP: (108-131)/(67-86) 114/86 (11/15 1112) SpO2:  [96 %-100 %] 97 % (11/15 1112)  General - Well nourished, well developed, in no apparent distress.  Ophthalmologic - fundi not visualized due to noncooperation.  Cardiovascular - Regular rhythm and rate.  Mental Status -  Level of arousal and orientation to year, place, and person were intact, however not oriented to month. Language including expression, naming, repetition, comprehension was assessed and found intact.  Cranial Nerves II - XII - II - Right hemianopia, but able to see hand waving on the right lower quadrant today. III, IV, VI - Extraocular movements intact. V - Facial sensation intact bilaterally. VII - Facial movement intact bilaterally. VIII - Hearing & vestibular intact bilaterally. X - Palate elevates symmetrically. XI - Chin turning & shoulder shrug intact bilaterally. XII - Tongue protrusion intact.  Motor Strength - The patient's strength was symmetrical in all extremities and pronator drift was absent.  Bulk was normal and fasciculations were absent.   Motor Tone - Muscle tone was assessed at the neck and appendages and was normal.  Reflexes - The patient's reflexes were symmetrical in all extremities and she had no pathological reflexes.  Sensory - Light touch, temperature/pinprick were assessed and were symmetrical.    Coordination - The patient had normal movements in the hands with no ataxia or dysmetria.  Tremor was absent.  Gait and Station - deferred.   ASSESSMENT/PLAN [name] is a 69 y.o. 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  She did not receive IV t-PA due to late presentation (>4.5 hours from time of onset)  Stroke: Acute to subacute left  PCA infarct with left P1 occlusion - embolic - etiology unclear  CT head - Acute to subacute left occipital cortical infarct, new since previous studies. Old right temporo-occipital infarct.  CTA H&N - Occlusion of the left vertebral artery at its origin with reconstitution in the mid cervical region by cervical collaterals as a small vessel that does show flow to the foramen magnum. 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   MRI head - left PCA scattered large infarct  2D Echo - EF 60 to 65%, LV no regional wall motion abnormalities  [employer] Virus 2 - negative  LDL - 92  HgbA1c - 5.4  UDS - negative  VTE prophylaxis - Lovenox  Eliquis (apixaban) daily prior to admission, now on ASA 325 mg.   Patient counseled to be compliant with her antithrombotic medications  Ongoing aggressive stroke risk factor management  Therapy recommendations:  CIR  Disposition:  Pending  Hx of LV thrombus  09/2018 one 2D echo showed LV thrombus, EF 60 to 65%  This admission 2D echo showed no LV thrombus, EF 60 to 65%, LV no regional wall motion abnormalities  On eliquis PTA  Now on ASA 325 due to large infarct left PCA  Cardiology consult Dr. Algie Coffer 11/14, TEE scheduled for tomorrow  History of stroke  09/2019 admitted for left-sided weakness.  CT showed right temporal occipital infarct.  MRI showed right thalamic infarct.  CT head and neck right ICA 50% stenosis.  EF 60 to 65% but found to have LV thrombus, put on Eliquis.  LDL 118 and A1c 5.7, discharged with Lipitor 80.  Hyperlipidemia  Home Lipid lowering medication: Lipitor 80 mg daily  LDL 92, goal < 70  Current lipid lowering medication: Lipitor 80 mg daily  Continue statin at discharge  Other Stroke Risk Factors  Advanced age  Former cigarette smoker - quit 6 years ago  Previous ETOH use  Obesity, Body mass index is 30.65 kg/m., recommend weight loss, diet and exercise as appropriate   Family hx  stroke (mother)   Coronary artery disease - prior Mi  Other Active Problems  Code status - Full code   UTI - Rocephin -> Keflex started 11/14 for 3 doses.  Aortic Atherosclerosis (ICD10-I70.0).   RLS on requip [facility] day # 3  Marvel Plan, MD PhD Stroke Neurology 03/11/2020 6:51 PM  To contact Stroke Continuity provider, please refer to [email or web address]. After hours, contact General Neurology

## 2020-03-11 NOTE — Consult Note (Signed)
Ref: Aida Puffer, MD   Subjective:  Feeling better. Right eye vision disturbance as before. Understood need for TEE tomorrow.  Objective:  Vital Signs in the last 24 hours: Temp:  [97.6 F (36.4 C)-98.2 F (36.8 C)] 97.6 F (36.4 C) (11/15 0731) Pulse Rate:  [74-91] 85 (11/15 0731) Cardiac Rhythm: Normal sinus rhythm (11/15 0745) Resp:  [18-20] 20 (11/15 0731) BP: (108-131)/(62-72) 108/72 (11/15 0731) SpO2:  [96 %-100 %] 96 % (11/15 0731)  Physical Exam: BP Readings from Last 1 Encounters:  03/11/20 108/72     Wt Readings from Last 1 Encounters:  03/08/20 78.5 kg    Weight change:  Body mass index is 30.65 kg/m. HEENT: /AT, Eyes-Light Brown, Conjunctiva-Pink, Sclera-Non-icteric Neck: No JVD, No bruit, Trachea midline. Lungs:  Clear, Bilateral. Cardiac:  Regular rhythm, normal S1 and S2, no S3. II/VI systolic murmur. Abdomen:  Soft, non-tender. BS present. Extremities:  No edema present. No cyanosis. No clubbing. CNS: AxOx3, Cranial nerves grossly intact, moves all 4 extremities.  Skin: Warm and dry.   Intake/Output from previous day: 11/14 0701 - 11/15 0700 In: 387 [P.O.:287; IV Piggyback:100] Out: -     Lab Results: BMET    Component Value Date/Time   NA 138 03/11/2020 0430   NA 139 03/10/2020 0116   NA 141 03/08/2020 1713   K 3.8 03/11/2020 0430   K 3.6 03/10/2020 0116   K 3.9 03/08/2020 1713   CL 107 03/11/2020 0430   CL 108 03/10/2020 0116   CL 107 03/08/2020 1713   CO2 23 03/11/2020 0430   CO2 22 03/10/2020 0116   CO2 24 03/08/2020 1713   GLUCOSE 110 (H) 03/11/2020 0430   GLUCOSE 105 (H) 03/10/2020 0116   GLUCOSE 94 03/08/2020 1713   BUN 15 03/11/2020 0430   BUN 17 03/10/2020 0116   BUN 10 03/08/2020 1713   CREATININE 0.85 03/11/2020 0430   CREATININE 0.85 03/10/2020 0116   CREATININE 0.80 03/08/2020 1713   CALCIUM 9.1 03/11/2020 0430   CALCIUM 9.2 03/10/2020 0116   CALCIUM 9.7 03/08/2020 1713   GFRNONAA >60 03/11/2020 0430   GFRNONAA  >60 03/10/2020 0116   GFRNONAA >60 03/08/2020 1713   GFRAA >60 10/18/2019 0545   GFRAA >60 10/11/2019 0610   GFRAA >60 10/06/2019 0541   CBC    Component Value Date/Time   WBC 6.5 03/11/2020 0430   RBC 4.47 03/11/2020 0430   HGB 13.6 03/11/2020 0430   HCT 40.5 03/11/2020 0430   PLT 263 03/11/2020 0430   MCV 90.6 03/11/2020 0430   MCH 30.4 03/11/2020 0430   MCHC 33.6 03/11/2020 0430   RDW 12.1 03/11/2020 0430   LYMPHSABS 2.7 03/11/2020 0430   MONOABS 0.6 03/11/2020 0430   EOSABS 0.4 03/11/2020 0430   BASOSABS 0.1 03/11/2020 0430   HEPATIC Function Panel Recent Labs    10/01/19 2056 10/06/19 0541 03/08/20 1713  PROT 6.1* 6.4* 7.1   HEMOGLOBIN A1C No components found for: HGA1C,  MPG CARDIAC ENZYMES Lab Results  Component Value Date   CKTOTAL 192 (H) 07/17/2016   BNP No results for input(s): PROBNP in the last 8760 hours. TSH No results for input(s): TSH in the last 8760 hours. CHOLESTEROL Recent Labs    10/02/19 0630 03/09/20 0215  CHOL 176 157    Scheduled Meds: . aspirin EC  325 mg Oral Daily  . atorvastatin  80 mg Oral Daily  . cephALEXin  500 mg Oral Q12H  . 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 NPO after mid-night.   LOS: 3 days   Time spent including chart review, lab review, examination, discussion with patient and nurse : 25 min   Orpah Cobb  MD  03/11/2020, 8:51 AM

## 2020-03-11 NOTE — Progress Notes (Signed)
Occupational Therapy Treatment Patient Details Name: [name] Good MRN: [id number] DOB: 15-Sep-1950 Today's Date: 03/11/2020    History of present illness 69 y.o. 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 Pt presented to the ED with c/o AMS and blurry vision R eye.   OT comments  Pt making steady progress towards OT goals this session. Pt continues to present with decreased activity tolerance,  generalized weakness, R sided visual field deficits and mild cognitive deficits impacting pts ability to complete BADLs independently. Session focus on table top visual activities to increase sustained attention to task and to furhter assess visual deficits. Pt able to track in all quadrants but pt unable to locate shapes in R upper quadrant during table top activity, pt also required assist to read menu to order dinner. Pt again unable to locate stimuli in R upper quadrant without tactile cues and repositioning menu within pts available visual field. Feel pt appropriate for CIR d/t visual deficits as well as cognition. Will continue to follow pt acutely and update DC recs as needed.    Follow Up Recommendations  CIR    Equipment Recommendations  Other (comment);None recommended by OT (TBD)    Recommendations for Other Services Rehab consult    Precautions / Restrictions Precautions Precautions: Fall Restrictions Weight Bearing Restrictions: No       Mobility Bed Mobility Overal bed mobility: Needs Assistance Bed Mobility: Supine to Sit;Sit to Supine     Supine to sit: HOB elevated;Supervision Sit to supine: Supervision;HOB elevated   General bed mobility comments: supervision for safety  Transfers Overall transfer level: Needs assistance Equipment used: Rolling walker (2 wheeled) Transfers: Sit to/from Stand   Stand pivot transfers: Min guard       General transfer  comment: session focus on seated visual task    Balance Overall balance assessment: Needs assistance Sitting-balance support: Feet supported Sitting balance-Leahy Scale: Fair                                     ADL either performed or assessed with clinical judgement   ADL Overall ADL's : Needs assistance/impaired                     Lower Body Dressing: Minimal assistance;Sit to/from stand;Set up Lower Body Dressing Details (indicate cue type and reason): light MIN A to don shoes from EOB once slippers are s/u             Functional mobility during ADLs: Supervision/safety (bed mobility only) General ADL Comments: session focus on visual table top tasks adnd cognition     Vision Baseline Vision/History: Wears glasses Wears Glasses: Reading only Patient Visual Report: Blurring of vision;Other (comment) (R sided inattention) Vision Assessment?: Yes;Vision impaired- to be further tested in functional context Eye Alignment: Within Functional Limits Ocular Range of Motion: Within Functional Limits Alignment/Gaze Preference: Within Defined Limits Tracking/Visual Pursuits: Able to track stimulus in all quads without difficulty Visual Fields: Other (comment);Impaired-to be further tested in functional context Additional Comments: pt completed table top visual activity where pt was instructed to locate shapes on paper- pt noted to miss items in R upper quadrant. worked with pt on reading menu to place dinner order. pt with increased difficulty locating items on busy menu. pt required max mulitmodal cues to locate dinner section of  menu on R upper quadrant.   Perception     Praxis      Cognition Arousal/Alertness: Awake/alert;Lethargic (initially lethargic upon arrival but did arouse more as session progressed) Behavior During Therapy: WFL for tasks assessed/performed Overall Cognitive Status: Impaired/Different from baseline Area of Impairment:  Safety/judgement;Awareness;Problem solving;Memory                   Current Attention Level: Selective Memory: Decreased short-term memory (unable to recall why OTA had returned despite education of OTA returning to provide pt with word searches, pt also with no rememberance of walking with PT earlier in day) Following Commands: Follows one step commands consistently;Follows multi-step commands with increased time Safety/Judgement: Decreased awareness of deficits (blaming visual deficits on having just woken up) Awareness: Intellectual Problem Solving: Difficulty sequencing;Requires verbal cues;Requires tactile cues General Comments: pt with decreased awareness into deficits and requires tactile and verbal cues to sequence task likely d/t visual deficits        Exercises     Shoulder Instructions       General Comments issued pt word searches to work on visual tracking to R upper quadrant, pt reports "not knowing enough around here" in regards to being unable to order dinner, assisted pt with ordering dinner    Pertinent Vitals/ Pain       Pain Assessment: Faces Pain Score: 8  Faces Pain Scale: Hurts little more Pain Location: HA Pain Descriptors / Indicators: Headache Pain Intervention(s): Monitored during session  Home Living                                          Prior Functioning/Environment              Frequency  Min 3X/week        Progress Toward Goals  OT Goals(current goals can now be found in the care plan section)  Progress towards OT goals: Progressing toward goals  Acute Rehab OT Goals Patient Stated Goal: home OT Goal Formulation: With patient Time For Goal Achievement: 03/24/20 Potential to Achieve Goals: Good  Plan Discharge plan remains appropriate;Frequency remains appropriate    Co-evaluation                 AM-PAC OT "6 Clicks" Daily Activity     Outcome Measure   Help from another person eating  meals?: A Little Help from another person taking care of personal grooming?: A Little Help from another person toileting, which includes using toliet, bedpan, or urinal?: A Little Help from another person bathing (including washing, rinsing, drying)?: A Lot Help from another person to put on and taking off regular upper body clothing?: A Little Help from another person to put on and taking off regular lower body clothing?: A Lot 6 Click Score: 16    End of Session    OT Visit Diagnosis: Unsteadiness on feet (R26.81);Other symptoms and signs involving the nervous system (R29.898);Other symptoms and signs involving cognitive function   Activity Tolerance Patient tolerated treatment well   Patient Left in bed;with call bell/phone within reach;with bed alarm set   Nurse Communication Mobility status        Time: 4166-0630 OT Time Calculation (min): 23 min  Charges: OT General Charges $OT Visit: 1 Visit OT Treatments $Therapeutic Activity: 23-37 mins  Audery Amel., COTA/L Acute Rehabilitation Services [phone]    Angelina Pih  03/11/2020, 3:48 PM

## 2020-03-11 NOTE — Progress Notes (Signed)
PROGRESS NOTE    [name] Good   XLK:[id number]  DOB: 1950-12-20  DOA: 03/08/2020     3  PCP: Aida Puffer, MD  CC: AMS, right blurry vision   Hospital Course: [name] Good is a 69 y.o. 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  Started on Eliquis.  LV thrombus still present on repeat office visit 3 months later per daughter.   Pt reports compliance with eliquis.   Pt in to ED for confusion / AMS onset on Past Sat (6 days).  Loss of vision / blurry vision to the R side.  Had argument with husband on Sat night, headache onset after this.  Laid down to rest and headache resolved, but after this she noticed vision changes and felt confused.   No extremity weakness, no numbness nor tingling in extremities, no speech difficulty.   Daughter reports increasing confusion during phone conversations since Sat.  CT head in the ER revealed an acute to subacute left occipital cortical infarct, new since previous imaging.  Stable chronic small vessel ischemic changes also noted throughout.   Interval History:  No events overnight.  She seems to be more tearful in the mornings and began was crying this morning and upset.  She had a hard time remembering the plans we have discussed.  Right eye vision is unchanged and still poor in the periphery.  Old records reviewed in assessment of this patient  ROS: Constitutional: negative for chills and fevers, Respiratory: negative for cough, Cardiovascular: negative for chest pain and Gastrointestinal: negative for abdominal pain  Assessment & Plan: * Acute embolic stroke (HCC) - MRI brain shows new acute Left PCA infarcts (involves occipital, posteromedial temporal lobe, and left thalamus).  Chronic right parieto-occipital infarct noted - CTA head/neck shows occluded left vertebral artery but reconstitution distally and  patent right VA; extensive chronic small vessel changes - follow up neuro recs - continue asa 325 mg daily for now -Cardiology consulted given recurrent stroke with previous LV thrombus (not present on current TTE).  Tentative plan is for TEE on 03/12/2020.  Eliquis on hold regardless for procedure, then will need to further decide if patient needs anticoagulation  Left ventricular apical thrombus - eliquis on hold for now - current TTE does not reveal residual LV thrombus - cardiology consult placed for further input; patient will undergo TEE on 11/16 - patient on 325 mg Asa daily otherwise   Acute lower UTI - follow up urine culture: E. Coli - okay to further narrow from rocephin to keflex to finish course on 11/15  History of restless legs syndrome - continue requip   Antimicrobials: Rocephin  DVT prophylaxis: Lovenox Code Status: Full Family Communication: none present Disposition Plan: Status is: Inpatient  Remains inpatient appropriate because:Ongoing diagnostic testing needed not appropriate for outpatient work up, IV treatments appropriate due to intensity of illness or inability to take PO and Inpatient level of care appropriate due to severity of illness   Dispo: The patient is from: Home              Anticipated d/c is to: pending further PT/OT evals              Anticipated d/c date is: 3 days              Patient currently is not medically stable to d/c.  Objective: Blood pressure 114/86, pulse 88, temperature 98 F (36.7 C), temperature source Oral, resp. rate 20, height 5\' 3"  (1.6 m), weight 78.5 kg, SpO2 97 %.  Examination: General appearance: alert, cooperative and no distress Head: Normocephalic, without obvious abnormality, atraumatic Eyes: EOMI Lungs: clear to auscultation bilaterally Heart: regular rate and rhythm and S1, S2 normal Abdomen: soft, NT, ND, BS present Extremities: no edema Skin: mobility and turgor normal Neurologic: decreased vision  in right eye (loss of peripheral vision). 4/5 RUE strength  Consultants:   neuro  Procedures:     Data Reviewed: I have personally reviewed following labs and imaging studies Results for orders placed or performed during the hospital encounter of 03/08/20 (from the past 24 hour(s))  Basic metabolic panel     Status: Abnormal   Collection Time: 03/11/20  4:30 AM  Result Value Ref Range   Sodium 138 135 - 145 mmol/L   Potassium 3.8 3.5 - 5.1 mmol/L   Chloride 107 98 - 111 mmol/L   CO2 23 22 - 32 mmol/L   Glucose, Bld 110 (H) 70 - 99 mg/dL   BUN 15 8 - 23 mg/dL   Creatinine, Ser 1.610.85 0.44 - 1.00 mg/dL   Calcium 9.1 8.9 - 09.610.3 mg/dL   GFR, Estimated >04>60 >54>60 mL/min   Anion gap 8 5 - 15  CBC with Differential/Platelet     Status: None   Collection Time: 03/11/20  4:30 AM  Result Value Ref Range   WBC 6.5 4.0 - 10.5 K/uL   RBC 4.47 3.87 - 5.11 MIL/uL   Hemoglobin 13.6 12.0 - 15.0 g/dL   HCT 09.840.5 36 - 46 %   MCV 90.6 80.0 - 100.0 fL   MCH 30.4 26.0 - 34.0 pg   MCHC 33.6 30.0 - 36.0 g/dL   RDW 11.912.1 14.711.5 - 82.915.5 %   Platelets 263 150 - 400 K/uL   nRBC 0.0 0.0 - 0.2 %   Neutrophils Relative % 45 %   Neutro Abs 2.9 1.7 - 7.7 K/uL   Lymphocytes Relative 41 %   Lymphs Abs 2.7 0.7 - 4.0 K/uL   Monocytes Relative 8 %   Monocytes Absolute 0.6 0.1 - 1.0 K/uL   Eosinophils Relative 5 %   Eosinophils Absolute 0.4 0.0 - 0.5 K/uL   Basophils Relative 1 %   Basophils Absolute 0.1 0.0 - 0.1 K/uL   Immature Granulocytes 0 %   Abs Immature Granulocytes 0.02 0.00 - 0.07 K/uL  Magnesium     Status: None   Collection Time: 03/11/20  4:30 AM  Result Value Ref Range   Magnesium 2.0 1.7 - 2.4 mg/dL    Recent Results (from the past 240 hour(s))  Urine culture     Status: Abnormal   Collection Time: 03/08/20  5:55 PM   Specimen: Urine, Random  Result Value Ref Range Status   Specimen Description URINE, RANDOM  Final   Special Requests   Final    NONE Performed at [facility],  [address] [place], [place] [address]    Culture >=100,000 COLONIES/mL ESCHERICHIA COLI (A)  Final   Report Status 03/10/2020 FINAL  Final   Organism ID, Bacteria ESCHERICHIA COLI (A)  Final      Susceptibility   Escherichia coli - MIC*    AMPICILLIN >=32 RESISTANT Resistant     CEFAZOLIN <=4 SENSITIVE Sensitive     CEFEPIME <=0.12 SENSITIVE Sensitive     CEFTRIAXONE <=0.25 SENSITIVE Sensitive  CIPROFLOXACIN >=4 RESISTANT Resistant     GENTAMICIN <=1 SENSITIVE Sensitive     IMIPENEM <=0.25 SENSITIVE Sensitive     NITROFURANTOIN <=16 SENSITIVE Sensitive     TRIMETH/SULFA >=320 RESISTANT Resistant     AMPICILLIN/SULBACTAM 4 SENSITIVE Sensitive     PIP/TAZO <=4 SENSITIVE Sensitive     * >=100,000 COLONIES/mL ESCHERICHIA COLI  Respiratory Panel by RT PCR (Flu A&B, Covid) - Nasopharyngeal Swab     Status: None   Collection Time: 03/08/20  9:04 PM   Specimen: Nasopharyngeal Swab  Result Value Ref Range Status   SARS Coronavirus 2 by RT PCR NEGATIVE NEGATIVE Final    Comment: (NOTE) SARS-CoV-2 target nucleic acids are NOT DETECTED.  The SARS-CoV-2 RNA is generally detectable in upper respiratoy specimens during the acute phase of infection. The lowest concentration of SARS-CoV-2 viral copies this assay can detect is 131 copies/mL. A negative result does not preclude SARS-Cov-2 infection and should not be used as the sole basis for treatment or other patient management decisions. A negative result may occur with  improper specimen collection/handling, submission of specimen other than nasopharyngeal swab, presence of viral mutation(s) within the areas targeted by this assay, and inadequate number of viral copies (<131 copies/mL). A negative result must be combined with clinical observations, patient history, and epidemiological information. The expected result is Negative.  Fact Sheet for Patients:  https://www.moore.com/  Fact Sheet for Healthcare Providers:   https://www.young.biz/  This test is no t yet approved or cleared by the [place] FDA and  has been authorized for detection and/or diagnosis of SARS-CoV-2 by FDA under an Emergency Use Authorization (EUA). This EUA will remain  in effect (meaning this test can be used) for the duration of the COVID-19 declaration under Section 564(b)(1) of the Act, 21 U.S.C. section 360bbb-3(b)(1), unless the authorization is terminated or revoked sooner.     Influenza A by PCR NEGATIVE NEGATIVE Final   Influenza B by PCR NEGATIVE NEGATIVE Final    Comment: (NOTE) The Xpert Xpress SARS-CoV-2/FLU/RSV assay is intended as an aid in  the diagnosis of influenza from Nasopharyngeal swab specimens and  should not be used as a sole basis for treatment. Nasal washings and  aspirates are unacceptable for Xpert Xpress SARS-CoV-2/FLU/RSV  testing.  Fact Sheet for Patients: https://www.moore.com/  Fact Sheet for Healthcare Providers: https://www.young.biz/  This test is not yet approved or cleared by the [place] FDA and  has been authorized for detection and/or diagnosis of SARS-CoV-2 by  FDA under an Emergency Use Authorization (EUA). This EUA will remain  in effect (meaning this test can be used) for the duration of the  Covid-19 declaration under Section 564(b)(1) of the Act, 21  U.S.C. section 360bbb-3(b)(1), unless the authorization is  terminated or revoked. Performed at [facility], [address] [place], [place] [address]      Radiology Studies: MR BRAIN WO CONTRAST  Result Date: 03/09/2020 CLINICAL DATA:  Confusion and right-sided vision loss/blurry vision. History of stroke. EXAM: MRI HEAD WITHOUT CONTRAST TECHNIQUE: Multiplanar, multiecho pulse sequences of the brain and surrounding structures were obtained without intravenous contrast. COMPARISON:  Head and neck CTA 03/09/2020.  Head MRI 10/02/2019. FINDINGS:  Brain: There are acute left PCA territory infarcts involving the occipital lobe, posteromedial temporal lobe, and left thalamus. A small chronic right parieto-occipital infarct is again noted with a small amount of associated chronic blood products. Patchy to confluent T2 hyperintensities in the cerebral white matter bilaterally, deep gray nuclei,  and brainstem are similar to the prior MRI and are nonspecific but compatible with severe chronic small vessel ischemic disease. A nonenlarged partially empty sella is unchanged. There is mild-to-moderate cerebral atrophy. No mass, midline shift, or extra-axial fluid collection is identified. Vascular: Small and poorly visualized distal left vertebral artery, more fully evaluated on today's CTA. Skull and upper cervical spine: Unremarkable bone marrow signal. Sinuses/Orbits: Bilateral cataract extraction. Left maxillary sinus mucous retention cyst. Mild mucosal thickening in the other paranasal sinuses. Trace left mastoid fluid. Other: None. IMPRESSION: 1. Patchy acute left PCA territory infarcts. 2. Severe chronic small vessel ischemic disease with multiple old infarcts as above. Electronically Signed   By: Sebastian Ache M.D.   On: 03/09/2020 16:50   MR BRAIN WO CONTRAST  Final Result    CT ANGIO NECK W OR WO CONTRAST  Final Result    CT ANGIO HEAD W OR WO CONTRAST  Final Result    DG Chest 2 View  Final Result    CT HEAD WO CONTRAST  Final Result      Scheduled Meds: . aspirin EC  325 mg Oral Daily  . atorvastatin  80 mg Oral Daily  . cephALEXin  500 mg Oral Q12H  . enoxaparin (LOVENOX) injection  40 mg Subcutaneous Q24H   PRN Meds: acetaminophen **OR** acetaminophen (TYLENOL) oral liquid 160 mg/5 mL **OR** acetaminophen, rOPINIRole Continuous Infusions:    LOS: 3 days  Time spent: Greater than 50% of the 35 minute visit was spent in counseling/coordination of care for the patient as laid out in the A&P.   Lewie Chamber, MD [facility] 03/11/2020, 11:35 AM

## 2020-03-11 NOTE — Progress Notes (Signed)
Physical Therapy Treatment Patient Details Name: [name] Good MRN: [id number] DOB: 07/27/1950 Today's Date: 03/11/2020    History of Present Illness 69 y.o. 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 Pt presented to the ED with c/o AMS and blurry vision R eye.    PT Comments    Today's skilled session continued to focus on mobility progression. Pt with increased gait distance. Does continue to require cues to attend to right side due to vision issues per patient report. Emotionally labile with tears at times during session. Acute PT to continue during pt's hospital stay.    Follow Up Recommendations  Home health PT;Supervision/Assistance - 24 hour     Equipment Recommendations  None recommended by PT    Precautions / Restrictions Precautions Precautions: Fall Restrictions Weight Bearing Restrictions: No    Mobility  Bed Mobility               General bed mobility comments: seated at edge of bed on arrival to room  Transfers Overall transfer level: Needs assistance Equipment used: Rolling walker (2 wheeled) Transfers: Sit to/from Stand   Stand pivot transfers: Min guard       General transfer comment: cues for hand placement with increased time/effort needed to stand  Ambulation/Gait Ambulation/Gait assistance: Min guard Gait Distance (Feet): 60 Feet Assistive device: Rolling walker (2 wheeled) Gait Pattern/deviations: Step-through pattern;Decreased stride length Gait velocity: decreased   General Gait Details: cues to attend to right to avoid hitting objects with gait and to scan to right to locate chair at end of session    Modified Rankin (Stroke Patients Only) Modified Rankin (Stroke Patients Only) Pre-Morbid Rankin Score: Slight disability Modified Rankin: Moderately severe disability     Cognition Arousal/Alertness: Awake/alert Behavior  During Therapy: WFL for tasks assessed/performed;Anxious (emotionally labile) Overall Cognitive Status: Impaired/Different from baseline Area of Impairment: Safety/judgement;Awareness;Problem solving                   Current Attention Level: Selective Memory: Decreased short-term memory Following Commands: Follows one step commands consistently;Follows multi-step commands with increased time Safety/Judgement: Decreased awareness of safety     General Comments: pt tearful with session. continues to report right vision issues needing cues to avoid objects/attend to this side with mobility.       Pertinent Vitals/Pain Pain Assessment: 0-10 Pain Score: 8  Pain Location: legs from restless leg syndrome, mild headache Pain Descriptors / Indicators: Discomfort Pain Intervention(s): Limited activity within patient's tolerance;Monitored during session;RN gave pain meds during session;Repositioned (RN in room giving meds as PTA arrived to room)     PT Goals (current goals can now be found in the care plan section) Acute Rehab PT Goals Patient Stated Goal: home PT Goal Formulation: With patient Time For Goal Achievement: 03/23/20 Potential to Achieve Goals: Good Progress towards PT goals: Progressing toward goals    Frequency    Min 4X/week      PT Plan Current plan remains appropriate    AM-PAC PT "6 Clicks" Mobility   Outcome Measure  Help needed turning from your back to your side while in a flat bed without using bedrails?: None Help needed moving from lying on your back to sitting on the side of a flat bed without using bedrails?: A Little Help needed moving to and from a bed to a chair (including a wheelchair)?: A Little Help needed standing up from a chair  using your arms (e.g., wheelchair or bedside chair)?: A Little Help needed to walk in hospital room?: A Little Help needed climbing 3-5 steps with a railing? : A Lot 6 Click Score: 18    End of Session  Equipment Utilized During Treatment: Gait belt Activity Tolerance: Patient tolerated treatment well Patient left: with call bell/phone within reach;in chair;with chair alarm set Nurse Communication: Mobility status PT Visit Diagnosis: Other abnormalities of gait and mobility (R26.89);Difficulty in walking, not elsewhere classified (R26.2)     Time: 1610-9604 PT Time Calculation (min) (ACUTE ONLY): 15 min  Charges:  $Gait Training: 8-22 mins                    Sallyanne Kuster, PTA, CLT Acute [employer] Office- [phone] 03/11/20, 1:29 PM  Sallyanne Kuster 03/11/2020, 1:27 PM

## 2020-03-12 ENCOUNTER — Encounter (HOSPITAL_COMMUNITY)
Admission: EM | Disposition: A | Discharge status: Home Health | Payer: Self-pay | Source: Home / Self Care | Attending: Internal Medicine

## 2020-03-12 ENCOUNTER — Encounter (HOSPITAL_COMMUNITY): Payer: Self-pay | Admitting: Internal Medicine

## 2020-03-12 ENCOUNTER — Inpatient Hospital Stay (HOSPITAL_COMMUNITY): Payer: Medicare Other

## 2020-03-12 DIAGNOSIS — I639 Cerebral infarction, unspecified: Secondary | ICD-10-CM | POA: Diagnosis not present

## 2020-03-12 HISTORY — PX: TEE WITHOUT CARDIOVERSION: SHX5443

## 2020-03-12 HISTORY — PX: BUBBLE STUDY: SHX6837

## 2020-03-12 LAB — CBC WITH DIFFERENTIAL/PLATELET
Abs Immature Granulocytes: 0.01 10*3/uL (ref 0.00–0.07)
Basophils Absolute: 0.1 10*3/uL (ref 0.0–0.1)
Basophils Relative: 1 %
Eosinophils Absolute: 0.3 10*3/uL (ref 0.0–0.5)
Eosinophils Relative: 6 %
HCT: 38.6 % (ref 36.0–46.0)
Hemoglobin: 13 g/dL (ref 12.0–15.0)
Immature Granulocytes: 0 %
Lymphocytes Relative: 42 %
Lymphs Abs: 2.2 10*3/uL (ref 0.7–4.0)
MCH: 30.8 pg (ref 26.0–34.0)
MCHC: 33.7 g/dL (ref 30.0–36.0)
MCV: 91.5 fL (ref 80.0–100.0)
Monocytes Absolute: 0.5 10*3/uL (ref 0.1–1.0)
Monocytes Relative: 10 %
Neutro Abs: 2.1 10*3/uL (ref 1.7–7.7)
Neutrophils Relative %: 41 %
Platelets: 251 10*3/uL (ref 150–400)
RBC: 4.22 MIL/uL (ref 3.87–5.11)
RDW: 11.9 % (ref 11.5–15.5)
WBC: 5.3 10*3/uL (ref 4.0–10.5)
nRBC: 0 % (ref 0.0–0.2)

## 2020-03-12 LAB — BASIC METABOLIC PANEL
Anion gap: 10 (ref 5–15)
BUN: 14 mg/dL (ref 8–23)
CO2: 23 mmol/L (ref 22–32)
Calcium: 9.3 mg/dL (ref 8.9–10.3)
Chloride: 108 mmol/L (ref 98–111)
Creatinine, Ser: 0.85 mg/dL (ref 0.44–1.00)
GFR, Estimated: >60 mL/min (ref >60)
Glucose, Bld: 93 mg/dL (ref 70–99)
Potassium: 3.6 mmol/L (ref 3.5–5.1)
Sodium: 141 mmol/L (ref 135–145)

## 2020-03-12 LAB — MAGNESIUM: Magnesium: 2.1 mg/dL (ref 1.7–2.4)

## 2020-03-12 SURGERY — ECHOCARDIOGRAM, TRANSESOPHAGEAL
Anesthesia: Moderate Sedation

## 2020-03-12 MED ORDER — FENTANYL CITRATE (PF) 100 MCG/2ML IJ SOLN
INTRAMUSCULAR | Status: DC | PRN
  Administered 2020-03-12: 10 ug via INTRAVENOUS
  Administered 2020-03-12: 15 ug via INTRAVENOUS

## 2020-03-12 MED ORDER — BUTAMBEN-TETRACAINE-BENZOCAINE 2-2-14 % EX AERO
INHALATION_SPRAY | CUTANEOUS | Status: DC | PRN
  Administered 2020-03-12: 2 via TOPICAL

## 2020-03-12 MED ORDER — ROPINIROLE HCL 1 MG PO TABS
3.0000 mg | ORAL_TABLET | Freq: Every day | ORAL | Status: DC
  Administered 2020-03-12: 3 mg via ORAL
  Filled 2020-03-12 (×2): qty 3

## 2020-03-12 MED ORDER — FENTANYL CITRATE (PF) 100 MCG/2ML IJ SOLN
INTRAMUSCULAR | Status: AC
  Filled 2020-03-12: qty 4

## 2020-03-12 MED ORDER — MIDAZOLAM HCL (PF) 5 MG/ML IJ SOLN
INTRAMUSCULAR | Status: AC
  Filled 2020-03-12: qty 2

## 2020-03-12 MED ORDER — MIDAZOLAM HCL (PF) 10 MG/2ML IJ SOLN
INTRAMUSCULAR | Status: DC | PRN
  Administered 2020-03-12 (×2): 2 mg via INTRAVENOUS

## 2020-03-12 MED ORDER — ROPINIROLE HCL 1 MG PO TABS
3.0000 mg | ORAL_TABLET | Freq: Every evening | ORAL | Status: DC | PRN
  Administered 2020-03-12 – 2020-03-13 (×2): 3 mg via ORAL
  Filled 2020-03-12 (×2): qty 3

## 2020-03-12 MED ORDER — ONDANSETRON HCL 4 MG/2ML IJ SOLN
4.0000 mg | Freq: Four times a day (QID) | INTRAMUSCULAR | Status: DC | PRN
  Administered 2020-03-12: 4 mg via INTRAVENOUS
  Filled 2020-03-12: qty 2

## 2020-03-12 MED ORDER — DIPHENHYDRAMINE HCL 50 MG/ML IJ SOLN
INTRAMUSCULAR | Status: AC
  Filled 2020-03-12: qty 1

## 2020-03-12 NOTE — TOC Initial Note (Signed)
Transition of Care [facility]) - Initial/Assessment Note    Patient Details  Name: [name] Good MRN: [id number] Date of Birth: 02-26-1951  [facility] CM/SW Contact:    Kermit Balo, RN Phone Number: 03/12/2020, 4:18 PM  Clinical Narrative:                 Pt is requesting to d/c home with [facility] services. CM inquired about supervision at home and she states her daughter lives next door and she has friends that can assist. CM encouraged her to discuss this with family/ friends.  Pt was recently active with [facility] and would like to use them again. CM updated Amy with [facility] and she accepted the referral. TOC following.  Expected Discharge Plan: Home w Home Health Services Barriers to Discharge: Continued Medical Work up   Patient Goals and CMS Choice   CMS Medicare.gov Compare Post Acute Care list provided to:: Patient Choice offered to / list presented to : Patient  Expected Discharge Plan and Services Expected Discharge Plan: Home w Home Health Services   Discharge Planning Services: CM Consult Post Acute Care Choice: Home Health Living arrangements for the past 2 months: Single Family Home                           HH Arranged: PT, OT, Speech Therapy HH Agency: [facility] Date [facility] Agency Contacted: 03/12/20   Representative spoke with at [facility] Agency: Amy  Prior Living Arrangements/Services Living arrangements for the past 2 months: Single Family Home Lives with:: Spouse Patient language and need for interpreter reviewed:: Yes        Need for Family Participation in Patient Care: Yes (Comment) Care giver support system in place?: Yes (comment) Current home services: DME (3 in 1/ shower seat/ transfer bench/ walker) Criminal Activity/Legal Involvement Pertinent to Current Situation/Hospitalization: No - Comment as needed  Activities of Daily Living Home Assistive Devices/Equipment: Other (Comment) ADL Screening (condition at time of  admission) Patient's cognitive ability adequate to safely complete daily activities?: Yes Is the patient deaf or have difficulty hearing?: Yes Does the patient have difficulty seeing, even when wearing glasses/contacts?: No Does the patient have difficulty concentrating, remembering, or making decisions?: No Patient able to express need for assistance with ADLs?: Yes Does the patient have difficulty dressing or bathing?: No Independently performs ADLs?: Yes (appropriate for developmental age) Does the patient have difficulty walking or climbing stairs?: No Weakness of Legs: None Weakness of Arms/Hands: None  Permission Sought/Granted                  Emotional Assessment Appearance:: Appears stated age Attitude/Demeanor/Rapport: Engaged Affect (typically observed): Accepting Orientation: : Oriented to Self, Oriented to Place, Oriented to  Time, Oriented to Situation   Psych Involvement: No (comment)  Admission diagnosis:  Stroke [facility]) [I63.9] Acute embolic stroke [facility]) [I63.9] Acute CVA (cerebrovascular accident) [facility]) [I63.9] Patient Active Problem List   Diagnosis Date Noted  . Acute embolic stroke (HCC) 03/08/2020  . 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 Acute lower UTI 03/08/2020  . Right thalamic infarction (HCC) 10/05/2019  . Severe dehydration 10/02/2019  . Grief reaction 10/02/2019  . RLS (restless legs syndrome) 10/02/2019  . Acute CVA (cerebrovascular accident) (HCC) 10/01/2019  . Hypokalemia 10/01/2019  . Cellulitis of left upper extremity 10/18/2017  . Arthritis 10/18/2017  . Septic shock (HCC) 10/16/2017  . Former smoker 11/13/2016  . History of restless  legs syndrome 11/13/2016  . Nontraumatic incomplete tear of right rotator cuff 08/18/2016  . Autoimmune disease (HCC) 08/12/2016  . Primary osteoarthritis of both knees 08/12/2016  . Primary osteoarthritis of both feet 08/12/2016  . Obesity (BMI 35.0-39.9 without comorbidity) 08/12/2016  .  Osteoarthritis of right hip 10/25/2015  . 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 S/P lumbar laminectomy 06/14/2015   PCP:  Aida Puffer, MD Pharmacy:   [facility] [place] - [address] [address] [address] [place] [address] Phone: [phone] Fax: [phone]  [facility] [place], [place] - [address] [place] [address] Phone: [phone] Fax: [phone]     Social Determinants of Health (SDOH) Interventions    Readmission Risk Interventions No flowsheet data found.

## 2020-03-12 NOTE — Progress Notes (Signed)
PROGRESS NOTE  MCKINNLEY COTTIER  DOB: 11-Sep-1950  PCP: Aida Puffer, MD GEX:[id number]  DOA: 03/08/2020  LOS: 4 days   Chief Complaint  Patient presents with  . Blurred Vision  . 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 Started on Eliquis. LV thrombus still present on repeat office visit 3 months later per daughter.  Pt reports compliance with eliquis.  Pt presented to ED on 11/12 for confusion / AMS,Loss of vision / blurry vision to the R side. Had argument with husband on Sat night, headache onset after this. Laid down to rest and headache resolved, but after this she noticed vision changes and felt confused.  No extremity weakness, no numbness nor tingling in extremities, no speech difficulty.  Daughter reported increasing confusion during phone conversations since Sat.   CT head in the ER revealed an acute to subacute left occipital cortical infarct, new since previous imaging.  Stable chronic small vessel ischemic changes also noted throughout.  Subjective: Patient was seen and examined this morning. Elderly Caucasian female.  Lying down in bed.  Not in distress.  Underwent TEE today.  Negative study.  Assessment/Plan: Acute embolic stroke (HCC) - MRI brain  showed new acute Left PCA infarcts (involves occipital, posteromedial temporal lobe, and left thalamus).  Chronic right parieto-occipital infarct noted - CTA head/neck shows occluded left vertebral artery but reconstitution distally and patent right VA; extensive chronic small vessel changes -Currently on asa 325 mg daily. -Cardiology consulted given recurrent stroke with previous LV thrombus (not present on TTE).    Underwent TEE today on 11/16.  Negative for clots.  Patient does not need anticoagulation.  She will be  discharged on aspirin 325 mg daily per neurology recommendation.    History of left ventricular apical thrombus - eliquis on hold for now - current TTE and TEE do not reveal residual LV thrombus -Eliquis has been switched to aspirin 325 mg daily.  Acute lower UTI - follow up urine culture: E. Coli - okay to further narrow from rocephin to keflex to finish course on 11/15  History of restless legs syndrome - continue requip  Mobility: CIR was recommended by PT.  Patient prefers to go home. Code Status:   Code Status: Full Code  Nutritional status: Body mass index is 30.65 kg/m.     Diet Order            Diet Heart Room service appropriate? Yes; Fluid consistency: Thin  Diet effective now                 DVT prophylaxis: enoxaparin (LOVENOX) injection 40 mg Start: 03/08/20 2200   Antimicrobials:  None Fluid: None Consultants: Neurology Family Communication:  None at bedside  Status is: Inpatient  Remains inpatient appropriate because: Loop recorder planned for tomorrow morning.  Dispo: The patient is from: Home              Anticipated d/c is to: Home with home health              Anticipated d/c date is: Likely tomorrow after loop recorder placement              Patient currently is not medically stable to d/c.       Infusions:    Scheduled Meds: . aspirin EC  325 mg Oral  Daily  . atorvastatin  80 mg Oral Daily  . enoxaparin (LOVENOX) injection  40 mg Subcutaneous Q24H  . rOPINIRole  3 mg Oral QHS    Antimicrobials: Anti-infectives (From admission, onward)   Start     Dose/Rate Route Frequency Ordered Stop   03/10/20 2200  cephALEXin (KEFLEX) capsule 500 mg        500 mg Oral Every 12 hours 03/10/20 1412 03/11/20 2229   03/09/20 1900  cefTRIAXone (ROCEPHIN) 1 g in sodium chloride 0.9 % 100 mL IVPB  Status:  Discontinued        1 g 200 mL/hr over 30 Minutes Intravenous Every 24 hours 03/08/20 2004 03/10/20 1412   03/08/20 1900  cefTRIAXone  (ROCEPHIN) 1 g in sodium chloride 0.9 % 100 mL IVPB        1 g 200 mL/hr over 30 Minutes Intravenous  Once 03/08/20 1858 03/08/20 2012      PRN meds: acetaminophen **OR** acetaminophen (TYLENOL) oral liquid 160 mg/5 mL **OR** acetaminophen, ondansetron (ZOFRAN) IV   Objective: Vitals:   03/12/20 0941 03/12/20 1216  BP: 134/86 101/64  Pulse: 72 73  Resp: 17 15  Temp: (!) 97.5 F (36.4 C) 97.9 F (36.6 C)  SpO2: 100% 95%    Intake/Output Summary (Last 24 hours) at 03/12/2020 1441 Last data filed at 03/12/2020 7517 Gross per 24 hour  Intake 188.95 ml  Output --  Net 188.95 ml   Filed Weights   03/08/20 1643 03/12/20 0712  Weight: 78.5 kg 78.5 kg   Weight change:  Body mass index is 30.65 kg/m.   Physical Exam: General exam: Appears calm and comfortable.  Not in physical distress Skin: No rashes, lesions or ulcers. HEENT: Atraumatic, normocephalic, supple neck, no obvious bleeding Lungs: Clear to auscultation bilaterally CVS: Regular rate and rhythm, no murmur GI/Abd soft, nontender, nondistended, bowel present CNS: Alert, awake, oriented x3 Psychiatry: Mood appropriate Extremities: No pedal edema, no calf tenderness  Data Review: I have personally reviewed the laboratory data and studies available.  Recent Labs  Lab 03/08/20 1713 03/10/20 0116 03/11/20 0430 03/12/20 0427  WBC 8.5 7.8 6.5 5.3  NEUTROABS 4.9 3.7 2.9 2.1  HGB 14.6 13.1 13.6 13.0  HCT 44.2 39.1 40.5 38.6  MCV 92.7 92.7 90.6 91.5  PLT 244 235 263 251   Recent Labs  Lab 03/08/20 1713 03/10/20 0116 03/11/20 0430 03/12/20 0427  NA 141 139 138 141  K 3.9 3.6 3.8 3.6  CL 107 108 107 108  CO2 24 22 23 23   GLUCOSE 94 105* 110* 93  BUN 10 17 15 14   CREATININE 0.80 0.85 0.85 0.85  CALCIUM 9.7 9.2 9.1 9.3  MG  --  2.1 2.0 2.1    F/u labs ordered  Signed, , MD [facility] 03/12/2020

## 2020-03-12 NOTE — Progress Notes (Signed)
Physical Therapy Treatment Patient Details Name: [name] Good MRN: [id number] DOB: [birth year] Today's Date: 03/12/2020    History of Present Illness 69 y.o. 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 Pt presented to the ED with c/o AMS and blurry vision R eye.    PT Comments    Pt progressing towards her physical therapy goals. Ambulating 120 feet with a walker at a min guard assist level. Negotiated 3 steps with railings to simulate home entrance without physical difficulty. Continues to be limited by cognitive deficits, visual impairment, balance deficits, and decreased endurance. D/c plan remains appropriate.     Follow Up Recommendations  Home health PT;Supervision/Assistance - 24 hour     Equipment Recommendations  None recommended by PT    Recommendations for Other Services       Precautions / Restrictions Precautions Precautions: Fall Restrictions Weight Bearing Restrictions: No    Mobility  Bed Mobility Overal bed mobility: Modified Independent                Transfers Overall transfer level: Needs assistance Equipment used: Rolling walker (2 wheeled) Transfers: Sit to/from Stand Sit to Stand: Supervision            Ambulation/Gait Ambulation/Gait assistance: Min guard Gait Distance (Feet): 120 Feet Assistive device: Rolling walker (2 wheeled) Gait Pattern/deviations: Step-through pattern;Decreased stride length Gait velocity: decreased   General Gait Details: Cues for walker proximity, scanning environment   Stairs Stairs: Yes Stairs assistance: Min guard Stair Management: Two rails Number of Stairs: 3 General stair comments: cues for step by step   Wheelchair Mobility    Modified Rankin (Stroke Patients Only)       Balance Overall balance assessment: Needs assistance Sitting-balance support: Feet supported Sitting  balance-Leahy Scale: Fair     Standing balance support: Bilateral upper extremity supported;During functional activity Standing balance-Leahy Scale: Poor Standing balance comment: reliant on bil UE support                            Cognition Arousal/Alertness: Awake/alert Behavior During Therapy: WFL for tasks assessed/performed Overall Cognitive Status: Impaired/Different from baseline Area of Impairment: Safety/judgement;Awareness;Problem solving;Memory;Following commands                   Current Attention Level: Selective Memory: Decreased short-term memory Following Commands: Follows multi-step commands with increased time Safety/Judgement: Decreased awareness of deficits;Decreased awareness of safety Awareness: Emergent Problem Solving: Difficulty sequencing;Requires verbal cues;Requires tactile cues General Comments: Emerging awareness of deficits, requires cues for safety, pt emotional regarding current level of function      Exercises      General Comments        Pertinent Vitals/Pain Pain Assessment: Faces Faces Pain Scale: No hurt    Home Living                      Prior Function            PT Goals (current goals can now be found in the care plan section) Acute Rehab PT Goals Patient Stated Goal: think more clearly Potential to Achieve Goals: Good Progress towards PT goals: Progressing toward goals    Frequency    Min 4X/week      PT Plan Current plan remains appropriate    Co-evaluation  AM-PAC PT "6 Clicks" Mobility   Outcome Measure  Help needed turning from your back to your side while in a flat bed without using bedrails?: None Help needed moving from lying on your back to sitting on the side of a flat bed without using bedrails?: None Help needed moving to and from a bed to a chair (including a wheelchair)?: None Help needed standing up from a chair using your arms (e.g., wheelchair or  bedside chair)?: None Help needed to walk in hospital room?: A Little Help needed climbing 3-5 steps with a railing? : A Little 6 Click Score: 22    End of Session Equipment Utilized During Treatment: Gait belt Activity Tolerance: Patient tolerated treatment well Patient left: in bed;with call bell/phone within reach;with bed alarm set Nurse Communication: Mobility status PT Visit Diagnosis: Other abnormalities of gait and mobility (R26.89);Difficulty in walking, not elsewhere classified (R26.2)     Time: 8916-9450 PT Time Calculation (min) (ACUTE ONLY): 17 min  Charges:  $Gait Training: 8-22 mins                     Lillia Pauls, PT, DPT Acute Rehabilitation Services Pager [phone] Office [phone]    Norval Morton 03/12/2020, 5:20 PM

## 2020-03-12 NOTE — Progress Notes (Addendum)
STROKE TEAM PROGRESS NOTE   INTERVAL HISTORY No acute events overnight. No family at bedside. Patient is resting in bed, states that she feels depressed when asked how she is doing. TEE today showed resolution of her prior LV thrombus.   OBJECTIVE Vitals:   03/12/20 0826 03/12/20 0835 03/12/20 0941 03/12/20 1216  BP: 129/62 126/70 134/86 101/64  Pulse: 72 78 72 73  Resp: (!) 21 (!) 23 17 15   Temp:   (!) 97.5 F (36.4 C) 97.9 F (36.6 C)  TempSrc:   Oral Oral  SpO2: 100% 99% 100% 95%  Weight:      Height:        CBC:  Recent Labs  Lab 03/11/20 0430 03/12/20 0427  WBC 6.5 5.3  NEUTROABS 2.9 2.1  HGB 13.6 13.0  HCT 40.5 38.6  MCV 90.6 91.5  PLT 263 251    Basic Metabolic Panel:  Recent Labs  Lab 03/11/20 0430 03/12/20 0427  NA 138 141  K 3.8 3.6  CL 107 108  CO2 23 23  GLUCOSE 110* 93  BUN 15 14  CREATININE 0.85 0.85  CALCIUM 9.1 9.3  MG 2.0 2.1    Lipid Panel:     Component Value Date/Time   CHOL 157 03/09/2020 0215   TRIG 87 03/09/2020 0215   HDL 48 03/09/2020 0215   CHOLHDL 3.3 03/09/2020 0215   VLDL 17 03/09/2020 0215   LDLCALC 92 03/09/2020 0215   HgbA1c:  Lab Results  Component Value Date   HGBA1C 5.4 03/09/2020   Urine Drug Screen:     Component Value Date/Time   LABOPIA NONE DETECTED 03/08/2020 1755   COCAINSCRNUR NONE DETECTED 03/08/2020 1755   LABBENZ NONE DETECTED 03/08/2020 1755   AMPHETMU NONE DETECTED 03/08/2020 1755   THCU NONE DETECTED 03/08/2020 1755   LABBARB NONE DETECTED 03/08/2020 1755    Alcohol Level     Component Value Date/Time   ETH <10 03/08/2020 1724    IMAGING  CT ANGIO HEAD W OR WO CONTRAST CT ANGIO NECK W OR WO CONTRAST 03/09/2020 IMPRESSION:  1. Aortic atherosclerosis.  2. Atherosclerotic disease at both carotid bifurcations. 50% stenosis of the distal bulb on the right. No measurable stenosis on the left.  3. Occlusion of the left vertebral artery at its origin with reconstitution in the mid cervical  region by cervical collaterals as a small vessel that does show flow to the foramen magnum. Dominant right vertebral artery widely patent to the basilar.  4. 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  5. Extensive chronic small-vessel ischemic changes throughout the cerebral hemispheric white matter, basal ganglia and thalami.  Aortic Atherosclerosis (ICD10-I70.0). .   CT HEAD WO CONTRAST 03/08/2020 IMPRESSION:  1. Acute to subacute left occipital cortical infarct, new since previous studies.  2. Stable chronic small-vessel ischemic changes throughout the white matter, thalami, and right temporal occipital region.  3. No acute hemorrhage.   MRI HEAD WO CONTRAST 1. Patchy acute left PCA territory infarcts. 2. Severe chronic small vessel ischemic disease with multiple old infarcts as above.  DG Chest 2 View 03/08/2020 IMPRESSION:  No active cardiopulmonary disease  Transthoracic Echocardiogram  1. Left ventricular ejection fraction, by estimation, is 60 to 65%. The  left ventricle has normal function. The left ventricle has no regional  wall motion abnormalities. Left ventricular diastolic parameters are  consistent with Grade I diastolic  dysfunction (impaired relaxation).  2. Right ventricular systolic function is normal. The right ventricular  size is normal. There is normal pulmonary artery systolic pressure. The  estimated right ventricular systolic pressure is 23.1 mmHg.  3. The mitral valve is normal in structure. No evidence of mitral valve  regurgitation. No evidence of mitral stenosis.  4. The aortic valve was not well visualized. Aortic valve regurgitation  is not visualized. No aortic stenosis is present.  5. The inferior vena cava is normal in size with greater than 50%  respiratory variability, suggesting right atrial pressure of 3 mmHg.   ECG - SR rate 87 BPM. (See cardiology reading for complete details)   PHYSICAL EXAM  Temp:  [96.9 F (36.1  C)-98.1 F (36.7 C)] 97.9 F (36.6 C) (11/16 1216) Pulse Rate:  [71-93] 73 (11/16 1216) Resp:  [15-23] 15 (11/16 1216) BP: (99-154)/(62-94) 101/64 (11/16 1216) SpO2:  [95 %-100 %] 95 % (11/16 1216) Weight:  [78.5 kg] 78.5 kg (11/16 0712)  General - Well nourished, well developed, in no apparent distress.  Ophthalmologic - fundi not visualized due to noncooperation.  Cardiovascular - Regular rhythm and rate.  Mental Status -  AAOx4, following commands  Language including expression, naming, repetition, comprehension was assessed and found intact.  Cranial Nerves II - XII - II - Right hemianopsia  III, IV, VI - Extraocular movements intact. V - Facial sensation intact bilaterally. VII - Facial movement intact bilaterally. VIII - Hearing & vestibular intact bilaterally. X - Palate elevates symmetrically. XI - Chin turning & shoulder shrug intact bilaterally. XII - Tongue protrusion intact.  Motor Strength - The patient's strength was symmetrical in all extremities and pronator drift was absent.  Bulk was normal and fasciculations were absent.   Motor Tone - Muscle tone was assessed at the neck and appendages and was normal.  Reflexes - The patient's reflexes were symmetrical in all extremities and she had no pathological reflexes.  Sensory - Light touch, temperature/pinprick were assessed and were symmetrical.    Coordination - The patient had normal movements in the hands with no ataxia or dysmetria.  Tremor was absent.  Gait and Station - Deferred.   ASSESSMENT/PLAN Ms. SARENITY RAMAKER is a 69 y.o. 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  She did not receive IV t-PA due to late presentation (>4.5 hours from time of onset)  Stroke: Acute to subacute left PCA infarct with left P1 occlusion - embolic stroke of undetermined source   CT head - Acute to  subacute left occipital cortical infarct, new since previous studies. Old right temporo-occipital infarct.  CTA H&N - Occlusion of the left vertebral artery at its origin with reconstitution in the mid cervical region by cervical collaterals as a small vessel that does show flow to the foramen magnum. 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   MRI head - left PCA scattered large infarct  2D Echo - EF 60 to 65%, LV no regional wall motion abnormalities  [employer] Virus 2 - negative  LDL - 92  HgbA1c - 5.4  UDS - negative  VTE prophylaxis - Lovenox  Eliquis (apixaban) daily prior to admission, now on ASA 325 mg.   Patient counseled to be compliant with her antithrombotic medications  Ongoing aggressive stroke risk factor management  Therapy recommendations: PT recommends home health w/24/7 assistance, OT recommending CIR.  Disposition:  Home- the patient expresses that she wishes  to go home and does not want to go to CIR   Given the patients TEE was negative ( see more details below), and she has an embolic stroke of undetermined source have discussed with her placing an ILR. She is agreeable to this. Will reach out to the EP team to have this scheduled tomorrow as she is anxious to discharge  Hx of LV thrombus  09/2018 one 2D echo showed LV thrombus, EF 60 to 65%  This admission 2D echo showed no LV thrombus, EF 60 to 65%, LV no regional wall motion abnormalities  On eliquis PTA  Now on ASA 325 due to large infarct left PCA  Cardiology consult Dr. Algie Coffer 11/14, TEE revealed no LV clot, no clot in appendage, good LV systolic function, mild LVH, mild MR and TR. Minimal PI. Given LV thrombus has resolved, spoke to cardiology about necessity for [facility] it was recommended the patient be on either full strength ASA or Plavix. Will continue with full strength ASA.  History of stroke  09/2019 admitted for left-sided weakness. CT showed right temporal occipital infarct.   MRI showed right thalamic infarct. CT head and neck right ICA 50% stenosis.  EF 60 to 65% but found to have LV thrombus, put on Eliquis.  LDL 118 and A1c 5.7, discharged with Lipitor 80.  Hyperlipidemia  Home Lipid lowering medication: Lipitor 80 mg daily  LDL 92, goal < 70  Current lipid lowering medication: Lipitor 80 mg daily  Continue statin at discharge  Other Stroke Risk Factors  Advanced age  Former cigarette smoker - quit 6 years ago  Previous ETOH use  Obesity, Body mass index is 30.65 kg/m., recommend weight loss, diet and exercise as appropriate   Family hx stroke (mother)   Coronary artery disease - prior Mi  Other Active Problems  Code status - Full code   UTI - Rocephin -> Keflex started 11/14 for 3 doses.  Aortic Atherosclerosis (ICD10-I70.0).   RLS on requip Qhs  Hospital day # 4  Stark Jock, NP  Stroke Service Nurse Practitioner  03/12/2020 2:56 PM   ATTENDING NOTE: I reviewed above note and agree with the assessment and plan. Pt was seen and examined.   Patient sitting at edge of bed, eating ice cream.  However, depressed mood.  Still has right hemianopia.  TEE today showed no LV thrombus.  Discussed with Dr. Algie Coffer, no need for anticoagulation at the time.  We will continue aspirin.  Given no clear etiology for her stroke, will do loop recorder to rule out A. fib.  Marvel Plan, MD PhD Stroke Neurology 03/12/2020 7:49 PM     To contact Stroke Continuity provider, please refer to [email or web address]. After hours, contact General Neurology

## 2020-03-12 NOTE — Progress Notes (Signed)
Rehab Admissions Coordinator Note:  Patient was screened by Clois Dupes for appropriateness for an Inpatient Acute Rehab Consult. PT recommends HH and OT recommends CIR.   At this time, we are recommending Inpatient Rehab consult  for full assessment of needs. I will place order.  Clois Dupes RN MSN 03/12/2020, 10:28 AM  I can be reached at [phone].

## 2020-03-12 NOTE — Consult Note (Signed)
Ref: Aida Puffer, MD   Subjective:  Here for TEE. VS stable.  Objective:  Vital Signs in the last 24 hours: Temp:  [97.8 F (36.6 C)-98.1 F (36.7 C)] 98.1 F (36.7 C) (11/16 0712) Pulse Rate:  [77-88] 80 (11/16 0712) Cardiac Rhythm: Normal sinus rhythm (11/15 1900) Resp:  [15-20] 15 (11/16 0712) BP: (99-154)/(62-86) 154/69 (11/16 0712) SpO2:  [95 %-99 %] 97 % (11/16 0712) Weight:  [78.5 kg] 78.5 kg (11/16 5638)  Physical Exam: BP Readings from Last 1 Encounters:  03/12/20 (!) 154/69     Wt Readings from Last 1 Encounters:  03/12/20 78.5 kg    Weight change:  Body mass index is 30.65 kg/m. HEENT: [place]/AT, Eyes-Light Brown, Conjunctiva-Pink, Sclera-Non-icteric Neck: No JVD, No bruit, Trachea midline. Lungs:  Clear, Bilateral. Cardiac:  Regular rhythm, normal S1 and S2, no S3. II/VI systolic murmur. Abdomen:  Soft, non-tender. BS present. Extremities:  No edema present. No cyanosis. No clubbing. CNS: AxOx3, Cranial nerves grossly intact, moves all 4 extremities.  Skin: Warm and dry.   Intake/Output from previous day: 11/15 0701 - 11/16 0700 In: 429 [P.O.:240; I.V.:189] Out: -     Lab Results: BMET    Component Value Date/Time   NA 141 03/12/2020 0427   NA 138 03/11/2020 0430   NA 139 03/10/2020 0116   K 3.6 03/12/2020 0427   K 3.8 03/11/2020 0430   K 3.6 03/10/2020 0116   CL 108 03/12/2020 0427   CL 107 03/11/2020 0430   CL 108 03/10/2020 0116   CO2 23 03/12/2020 0427   CO2 23 03/11/2020 0430   CO2 22 03/10/2020 0116   GLUCOSE 93 03/12/2020 0427   GLUCOSE 110 (H) 03/11/2020 0430   GLUCOSE 105 (H) 03/10/2020 0116   BUN 14 03/12/2020 0427   BUN 15 03/11/2020 0430   BUN 17 03/10/2020 0116   CREATININE 0.85 03/12/2020 0427   CREATININE 0.85 03/11/2020 0430   CREATININE 0.85 03/10/2020 0116   CALCIUM 9.3 03/12/2020 0427   CALCIUM 9.1 03/11/2020 0430   CALCIUM 9.2 03/10/2020 0116   GFRNONAA >60 03/12/2020 0427   GFRNONAA >60 03/11/2020 0430    GFRNONAA >60 03/10/2020 0116   GFRAA >60 10/18/2019 0545   GFRAA >60 10/11/2019 0610   GFRAA >60 10/06/2019 0541   CBC    Component Value Date/Time   WBC 5.3 03/12/2020 0427   RBC 4.22 03/12/2020 0427   HGB 13.0 03/12/2020 0427   HCT 38.6 03/12/2020 0427   PLT 251 03/12/2020 0427   MCV 91.5 03/12/2020 0427   MCH 30.8 03/12/2020 0427   MCHC 33.7 03/12/2020 0427   RDW 11.9 03/12/2020 0427   LYMPHSABS 2.2 03/12/2020 0427   MONOABS 0.5 03/12/2020 0427   EOSABS 0.3 03/12/2020 0427   BASOSABS 0.1 03/12/2020 0427   HEPATIC Function Panel Recent Labs    10/01/19 2056 10/06/19 0541 03/08/20 1713  PROT 6.1* 6.4* 7.1   HEMOGLOBIN A1C No components found for: HGA1C,  MPG CARDIAC ENZYMES Lab Results  Component Value Date   CKTOTAL 192 (H) 07/17/2016   BNP No results for input(s): PROBNP in the last 8760 hours. TSH No results for input(s): TSH in the last 8760 hours. CHOLESTEROL Recent Labs    10/02/19 0630 03/09/20 0215  CHOL 176 157    Scheduled Meds: . [MAR Hold] aspirin EC  325 mg Oral Daily  . [MAR Hold] atorvastatin  80 mg Oral Daily  . [MAR Hold] enoxaparin (LOVENOX) injection  40 mg Subcutaneous Q24H  . [  MAR Hold] rOPINIRole  3 mg Oral QHS   Continuous Infusions: . sodium chloride 20 mL/hr at 03/12/20 0628   PRN Meds:.[MAR Hold] acetaminophen **OR** [MAR Hold] acetaminophen (TYLENOL) oral liquid 160 mg/5 mL **OR** [MAR Hold] acetaminophen, [MAR Hold] ondansetron (ZOFRAN) IV  Assessment/Plan: Acute embolic stroke Old multiple strokes H/o LV thrombus H/O Restless leg syndrome Obesity CAD GERD Depression  TEE today.Patient understood the procedure, risks and alternatives.   LOS: 4 days   Time spent including chart review, lab review, examination, discussion with patient : 25 min   Orpah Cobb  MD  03/12/2020, 7:36 AM

## 2020-03-12 NOTE — CV Procedure (Signed)
Preliminary TEE report. Patient sedated with 4 versed and 25 fentanyl total. Good LV systolic function. Mild LVH, No LV clot Miild MR and TR. Minimal PI. No clot in appendage No PFO with sonicated saline injection,  Orpah Cobb, MD 03/12/2020 at 08:13 AM

## 2020-03-12 NOTE — Progress Notes (Signed)
Inpatient Rehabilitation Admissions Coordinator  I met with patient at bedside. She prefers d/c directly home when medical work up complete. We will sign off at this time.    , RN, MSN Rehab Admissions Coordinator [phone] 03/12/2020 1:45 PM  

## 2020-03-12 NOTE — Progress Notes (Signed)
  Echocardiogram Echocardiogram Transesophageal has been performed.  [name] Good 03/12/2020, 8:35 AM

## 2020-03-12 NOTE — Consult Note (Signed)
Physical Medicine and Rehabilitation Consult Reason for Consult: Altered mental status with blurred vision Referring Physician: [facility]   HPI: [name] Good is a 69 y.o. 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  Received inpatient rehab services 10/05/2019 to 10/21/2019.  Per chart review patient lives with spouse.  1 level home 3 steps to entry.  Independent with assistive device.  Presented 03/08/2020 with altered mental status and blurred vision.  Cranial CT scan showed acute to subacute left occipital cortical infarct.  No acute hemorrhage.  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  Patient did not receive TPA.  MRI showed patchy acute left PCA territory infarction.  Echocardiogram pending.  TEE showed no PFO.  No LV clot.  Admission chemistries unremarkable, alcohol negative, urine drug screen negative, urine culture greater 100,000 E. coli.  Patient is currently maintained on aspirin for CVA prophylaxis.  Lovenox for DVT prophylaxis.  Keflex course completed for UTI.  Therapy evaluations completed with recommendations of physical medicine rehab consult.   Review of Systems  Constitutional: Negative for chills and fever.  HENT: Negative for hearing loss.   Eyes: Positive for blurred vision.  Respiratory: Negative for cough and shortness of breath.   Cardiovascular: Negative for chest pain, palpitations and leg swelling.  Gastrointestinal: Positive for constipation. Negative for heartburn, nausea and vomiting.       GERD  Genitourinary: Negative for dysuria, flank pain and hematuria.  Musculoskeletal: Positive  for joint pain and myalgias.  Skin: Negative for rash.  Neurological: Positive for dizziness and headaches.       Restless leg syndrome  Psychiatric/Behavioral: Positive for depression. The patient has insomnia.        Anxiety  All other systems reviewed and are negative.  Past Medical History:  Diagnosis Date  . Anxiety   . Arthritis   . Depression   . GERD (gastroesophageal reflux disease)   . Myocardial infarction (HCC) 89   no visits to cardiac dr(thomas kelly) since 89  . Stroke (HCC)   . Wound dehiscence    lumbar   Past Surgical History:  Procedure Laterality Date  . APPENDECTOMY     17 yrs  . BACK SURGERY    . BREAST SURGERY Left    cyst  . CARDIAC CATHETERIZATION  89  . CHOLECYSTECTOMY     69 yrs old  . LUMBAR LAMINECTOMY/DECOMPRESSION MICRODISCECTOMY N/A 06/14/2015   Procedure: Lumbar Three-Four,Lumbar Four-Five, Lumbar Five-Sacral One Laminectomy;  Surgeon: Tia Alertavid S Jones, MD;  Location: [facility];  Service: Neurosurgery;  Laterality: N/A;  . LUMBAR WOUND DEBRIDEMENT N/A 07/24/2015   Procedure: lumbar wound revision;  Surgeon: Tia Alertavid S Jones, MD;  Location: [facility];  Service: Neurosurgery;  Laterality: N/A;  . TOTAL HIP ARTHROPLASTY Right 10/25/2015   Procedure: RIGHT TOTAL HIP ARTHROPLASTY ANTERIOR APPROACH;  Surgeon: Kathryne Hitchhristopher Y Blackman, MD;  Location: [facility];  Service: Orthopedics;  Laterality: Right;  . TUBAL LIGATION     Family History  Problem Relation Age of Onset  . Stroke Mother   . Clotting disorder Neg Hx    Social History:  reports that  she quit smoking about 6 years ago. Her smoking use included cigarettes. She has a 80.00 pack-year smoking history. She has never used smokeless tobacco. She reports previous alcohol use. She reports that she does not use drugs. Allergies:  Allergies  Allergen Reactions  . Amoxicillin Other (See Comments)    Tolerated Zosyn Has patient had a PCN reaction causing immediate rash, facial/tongue/throat  swelling, SOB or lightheadedness with hypotension: No Has patient had a PCN reaction causing severe rash involving mucus membranes or skin necrosis: No Has patient had a PCN reaction that required hospitalization No Has patient had a PCN reaction occurring within the last 10 years: No If all of the above answers are "NO", then may proceed with Cephalosporin use.   Medications Prior to Admission  Medication Sig Dispense Refill  . apixaban (ELIQUIS) 5 MG TABS tablet Take 1 tablet (5 mg total) by mouth 2 (two) times daily. 60 tablet 1  . atorvastatin (LIPITOR) 80 MG tablet Take 1 tablet (80 mg total) by mouth daily. 30 tablet 0  . calcium carbonate (TUMS EX) 750 MG chewable tablet Chew 1 tablet by mouth 2 (two) times daily as needed for heartburn.    . furosemide (LASIX) 40 MG tablet Take 40 mg by mouth daily.     Marland Kitchen gabapentin (NEURONTIN) 400 MG capsule Take 1 capsule (400 mg total) by mouth 3 (three) times daily. 90 capsule 1  . losartan (COZAAR) 25 MG tablet Take 25 mg by mouth daily.    . magnesium oxide (MAG-OX) 400 MG tablet Take 1 tablet (400 mg total) by mouth daily. 30 tablet 0  . metoprolol succinate (TOPROL-XL) 25 MG 24 hr tablet Take 25 mg by mouth daily.    Marland Kitchen oxyCODONE-acetaminophen (PERCOCET/ROXICET) 5-325 MG tablet Take 1 tablet by mouth every 6 (six) hours as needed for moderate pain or severe pain. 20 tablet 0  . rOPINIRole (REQUIP) 3 MG tablet Take 1 tablet (3 mg total) by mouth at bedtime as needed (restless leg). 30 tablet 0  . tetrahydrozoline 0.05 % ophthalmic solution Place 1 drop into both eyes daily as needed (Dry eyes).    Marland Kitchen acetaminophen (TYLENOL) 325 MG tablet Take 2 tablets (650 mg total) by mouth every 4 (four) hours as needed for mild pain (or temp > 37.5 C (99.5 F)). (Patient not taking: Reported on 03/09/2020)      Home: Home Living Family/patient expects to be discharged to:: Private residence Living Arrangements: Spouse/significant other, Children Available  Help at Discharge: Family, Available 24 hours/day Type of Home: Mobile home Home Access: Stairs to enter [employer] of Steps: 3 Entrance Stairs-Rails: Left Home Layout: One level Bathroom Shower/Tub: Engineer, manufacturing systems: Handicapped height Home Equipment: Cane - single point, Environmental consultant - 4 wheels, Other (comment), Walker - 2 wheels, Bedside commode, Tub bench Additional Comments: History and home set up taken from chart. Pt is a poor historian.  Lives With: Spouse  Functional History: Prior Function Level of Independence: Independent with assistive device(s) Comments: RW vs. cane.  Pt reports she still drives, does grocery pick up. Functional Status:  Mobility: Bed Mobility Overal bed mobility: Needs Assistance Bed Mobility: Supine to Sit, Sit to Supine Supine to sit: HOB elevated, Supervision Sit to supine: Supervision, HOB elevated General bed mobility comments: supervision for safety Transfers Overall transfer level: Needs assistance Equipment used: Rolling walker (2 wheeled) Transfers: Sit to/from Stand Sit to Stand: Min guard Stand pivot transfers: Min guard General transfer comment: session focus on seated visual  task Ambulation/Gait Ambulation/Gait assistance: Min guard Gait Distance (Feet): 60 Feet Assistive device: Rolling walker (2 wheeled) Gait Pattern/deviations: Step-through pattern, Decreased stride length General Gait Details: cues to attend to right to avoid hitting objects with gait and to scan to right to locate chair at end of session Gait velocity: decreased Gait velocity interpretation: 1.31 - 2.62 ft/sec, indicative of limited community ambulator    ADL: ADL Overall ADL's : Needs assistance/impaired Eating/Feeding: Set up, Sitting Grooming: Wash/dry face, Minimal assistance, Standing Grooming Details (indicate cue type and reason): for balance; max cues to locate ADL items in R visual field and to utilize all grooming items (i.e.  using toothpaste for oral care) Upper Body Bathing: Min guard, Sitting Lower Body Bathing: Minimal assistance, Sit to/from stand Upper Body Dressing : Min guard, Sitting Lower Body Dressing: Minimal assistance, Sit to/from stand, Set up Lower Body Dressing Details (indicate cue type and reason): light MIN A to don shoes from EOB once slippers are s/u Toilet Transfer: Minimal assistance, Ambulation, RW Toileting- Clothing Manipulation and Hygiene: Minimal assistance, Sit to/from stand Functional mobility during ADLs: Supervision/safety (bed mobility only) General ADL Comments: session focus on visual table top tasks adnd cognition  Cognition: Cognition Overall Cognitive Status: Impaired/Different from baseline Arousal/Alertness: Awake/alert Orientation Level: Oriented X4 Attention: Focused, Sustained Focused Attention: Impaired Focused Attention Impairment: Verbal complex Sustained Attention: Impaired Sustained Attention Impairment: Verbal complex Memory: Impaired Memory Impairment: Retrieval deficit, Decreased recall of new information (Immediate: 5/5; delayed: 0/5; with cues: 3/5) Awareness: Impaired Awareness Impairment: Emergent impairment Problem Solving: Impaired Problem Solving Impairment: Verbal complex Executive Function: Reasoning, Sequencing, Organizing Reasoning: Impaired Reasoning Impairment: Verbal complex Sequencing: Impaired Sequencing Impairment: Verbal complex (Clock drawing: 0/4) Organizing:  (Mental manipulation: 2/2) Cognition Arousal/Alertness: Awake/alert, Lethargic (initially lethargic upon arrival but did arouse more as session progressed) Behavior During Therapy: WFL for tasks assessed/performed Overall Cognitive Status: Impaired/Different from baseline Area of Impairment: Safety/judgement, Awareness, Problem solving, Memory Orientation Level: Disoriented to, Situation Current Attention Level: Selective Memory: Decreased short-term memory (unable to  recall why OTA had returned despite education of OTA returning to provide pt with word searches, pt also with no rememberance of walking with PT earlier in day) Following Commands: Follows one step commands consistently, Follows multi-step commands with increased time Safety/Judgement: Decreased awareness of deficits (blaming visual deficits on having just woken up) Awareness: Intellectual Problem Solving: Difficulty sequencing, Requires verbal cues, Requires tactile cues General Comments: pt with decreased awareness into deficits and requires tactile and verbal cues to sequence task likely d/t visual deficits  Blood pressure 134/86, pulse 72, temperature (!) 97.5 F (36.4 C), temperature source Oral, resp. rate 17, height 5\' 3"  (1.6 m), weight 78.5 kg, SpO2 100 %. Physical Exam General: Alert and oriented x 2, No apparent distress HEENT: Head is normocephalic, atraumatic, PERRLA, EOMI, sclera anicteric, oral mucosa pink and moist, dentition intact, ext ear canals clear,  Neck: Supple without JVD or lymphadenopathy Heart: Reg rate and rhythm. No murmurs rubs or gallops Chest: CTA bilaterally without wheezes, rales, or rhonchi; no distress Abdomen: Soft, non-tender, non-distended, bowel sounds positive. Extremities: No clubbing, cyanosis, or edema. Pulses are 2+ Skin: Clean and intact without signs of breakdown Neuro:  Patient is more alert, oriented to year place and person but not month.  Limited medical historian.  Follows simple commands.  Psych: Pt's affect is tearful regarding her mother's death  Results for orders placed or performed during the hospital encounter of 03/08/20 (from the past 24 hour(s))  Basic metabolic  panel     Status: None   Collection Time: 03/12/20  4:27 AM  Result Value Ref Range   Sodium 141 135 - 145 mmol/L   Potassium 3.6 3.5 - 5.1 mmol/L   Chloride 108 98 - 111 mmol/L   CO2 23 22 - 32 mmol/L   Glucose, Bld 93 70 - 99 mg/dL   BUN 14 8 - 23 mg/dL    Creatinine, Ser 7.74 0.44 - 1.00 mg/dL   Calcium 9.3 8.9 - 12.8 mg/dL   GFR, Estimated >78 >67 mL/min   Anion gap 10 5 - 15  CBC with Differential/Platelet     Status: None   Collection Time: 03/12/20  4:27 AM  Result Value Ref Range   WBC 5.3 4.0 - 10.5 K/uL   RBC 4.22 3.87 - 5.11 MIL/uL   Hemoglobin 13.0 12.0 - 15.0 g/dL   HCT 67.2 36 - 46 %   MCV 91.5 80.0 - 100.0 fL   MCH 30.8 26.0 - 34.0 pg   MCHC 33.7 30.0 - 36.0 g/dL   RDW 09.4 70.9 - 62.8 %   Platelets 251 150 - 400 K/uL   nRBC 0.0 0.0 - 0.2 %   Neutrophils Relative % 41 %   Neutro Abs 2.1 1.7 - 7.7 K/uL   Lymphocytes Relative 42 %   Lymphs Abs 2.2 0.7 - 4.0 K/uL   Monocytes Relative 10 %   Monocytes Absolute 0.5 0.1 - 1.0 K/uL   Eosinophils Relative 6 %   Eosinophils Absolute 0.3 0.0 - 0.5 K/uL   Basophils Relative 1 %   Basophils Absolute 0.1 0.0 - 0.1 K/uL   Immature Granulocytes 0 %   Abs Immature Granulocytes 0.01 0.00 - 0.07 K/uL  Magnesium     Status: None   Collection Time: 03/12/20  4:27 AM  Result Value Ref Range   Magnesium 2.1 1.7 - 2.4 mg/dL   No results found.   Assessment/Plan: Diagnosis: Acute embolic stroke --Mrs. Schrom may d/c home with home services as per her preference. I will schedule physiatry outpatient follow-up to manage her home rehabilitation. She has already scheduled ophthalmology follow-up given her blurred vision.   Mcarthur Rossetti Angiulli, PA-C 03/12/2020   I have personally performed a face to face diagnostic evaluation, including, but not limited to relevant history and physical exam findings, of this patient and developed relevant assessment and plan.  Additionally, I have reviewed and concur with the physician assistant's documentation above.  Sula Soda, MD

## 2020-03-12 NOTE — Progress Notes (Signed)
Occupational Therapy Treatment Patient Details Name: [name] Good MRN: [id number] DOB: February 21, 1951 Today's Date: 03/12/2020    History of present illness 69 y.o. 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 Pt presented to the ED with c/o AMS and blurry vision R eye.   OT comments  Pt making steady progress towards OT goals this session. Pt continues to present with pain, decreased activity tolerance and visual/ cognitive deficits impacting pts ability to complete BADLs independently. Pt able to progress OOB this session to complete household distance functional mobility within the room with MIN A needing assist for RW proulsion and safety awareness. Pt continues to endorse blurry vision on R and L side laterally, with needing up to MIN A to maneuver RW and navigate obstacles within pts environment when mobilizing. Pt would continue to benefit from skilled occupational therapy while admitted and after d/c to address the below listed limitations in order to improve overall functional mobility and facilitate independence with BADL participation. DC plan remains appropriate, will follow acutely per POC.     Follow Up Recommendations  CIR    Equipment Recommendations  Other (comment);None recommended by OT (TBD)    Recommendations for Other Services Rehab consult    Precautions / Restrictions Precautions Precautions: Fall Restrictions Weight Bearing Restrictions: No       Mobility Bed Mobility Overal bed mobility: Needs Assistance Bed Mobility: Supine to Sit     Supine to sit: HOB elevated;Supervision        Transfers Overall transfer level: Needs assistance Equipment used: Rolling walker (2 wheeled) Transfers: Sit to/from Stand Sit to Stand: Min guard         General transfer comment: light min guard for balanace as pt reports initial dizziness    Balance  Overall balance assessment: Needs assistance Sitting-balance support: Feet supported Sitting balance-Leahy Scale: Fair     Standing balance support: Bilateral upper extremity supported;During functional activity Standing balance-Leahy Scale: Poor Standing balance comment: reliant on bil UE support                           ADL either performed or assessed with clinical judgement   ADL Overall ADL's : Needs assistance/impaired Eating/Feeding: Supervision/ safety;Set up;Sitting Eating/Feeding Details (indicate cue type and reason): pt able to pour sugar into coffee with set- up- supervision   Grooming Details (indicate cue type and reason): pt declined grooming tasks at sink secondary to fatigue                 Toilet Transfer: Minimal assistance;Ambulation;RW Toilet Transfer Details (indicate cue type and reason): simulated via functional mobility with MIN A for RW propulsion, pt continues to endorse blurry vision in R and L lateral visual field needing asssist to mange RW during mobility         Functional mobility during ADLs: Minimal assistance;Rolling walker General ADL Comments: pt continues to present with visual deficits, pain, and decreased activity tolerance     Vision Baseline Vision/History: Wears glasses Wears Glasses: Reading only Patient Visual Report: Blurring of vision;Other (comment) (pt reports both blurry vision laterally on R and L side) Additional Comments: pt continues to report blurry vision with mobility, but reports blurry vision both R and L laterally. pt able to complete functional mobliity within room needing MIN A to manage RW   Perception     Praxis  Cognition Arousal/Alertness: Awake/alert Behavior During Therapy: WFL for tasks assessed/performed Overall Cognitive Status: Impaired/Different from baseline Area of Impairment: Safety/judgement;Awareness;Problem solving;Memory;Orientation;Following commands                  Orientation Level: Disoriented to;Place (pt states shes in the wrong room)   Memory: Decreased short-term memory (no does not recall previous conversation with rehab admissions coordinator) Following Commands: Follows one step commands consistently;Follows multi-step commands with increased time Safety/Judgement: Decreased awareness of deficits;Decreased awareness of safety Awareness: Intellectual Problem Solving: Difficulty sequencing;Requires verbal cues;Requires tactile cues General Comments: pt able to state correct month and but states, " i dont think this is the room I was in earlier" after returning from TEE. explained potential CIR admission with pt unable to recall speaking with CIR coordinator.        Exercises     Shoulder Instructions       General Comments      Pertinent Vitals/ Pain       Pain Assessment: Faces Faces Pain Scale: Hurts little more Pain Location: bil hips Pain Descriptors / Indicators: Aching;Grimacing;Discomfort Pain Intervention(s): Monitored during session;RN gave pain meds during session;Repositioned  Home Living                                          Prior Functioning/Environment              Frequency  Min 3X/week        Progress Toward Goals  OT Goals(current goals can now be found in the care plan section)  Progress towards OT goals: Progressing toward goals  Acute Rehab OT Goals Patient Stated Goal: home OT Goal Formulation: With patient Time For Goal Achievement: 03/24/20 Potential to Achieve Goals: Good  Plan Discharge plan remains appropriate;Frequency remains appropriate    Co-evaluation                 AM-PAC OT "6 Clicks" Daily Activity     Outcome Measure   Help from another person eating meals?: None Help from another person taking care of personal grooming?: A Little Help from another person toileting, which includes using toliet, bedpan, or urinal?: A Little Help from another  person bathing (including washing, rinsing, drying)?: A Lot Help from another person to put on and taking off regular upper body clothing?: A Little Help from another person to put on and taking off regular lower body clothing?: A Lot 6 Click Score: 17    End of Session Equipment Utilized During Treatment: Gait belt;Rolling walker  OT Visit Diagnosis: Unsteadiness on feet (R26.81);Other symptoms and signs involving the nervous system (R29.898);Other symptoms and signs involving cognitive function   Activity Tolerance Patient tolerated treatment well   Patient Left in chair;with call bell/phone within reach;with chair alarm set   Nurse Communication Mobility status        Time: 1037-1100 OT Time Calculation (min): 23 min  Charges: OT General Charges $OT Visit: 1 Visit OT Treatments $Self Care/Home Management : 23-37 mins  Audery Amel., COTA/L Acute Rehabilitation Services [phone]    Angelina Pih 03/12/2020, 11:13 AM

## 2020-03-13 ENCOUNTER — Other Ambulatory Visit: Payer: Self-pay | Admitting: Neurology

## 2020-03-13 DIAGNOSIS — I639 Cerebral infarction, unspecified: Secondary | ICD-10-CM

## 2020-03-13 LAB — BASIC METABOLIC PANEL
Anion gap: 8 (ref 5–15)
BUN: 14 mg/dL (ref 8–23)
CO2: 25 mmol/L (ref 22–32)
Calcium: 9.4 mg/dL (ref 8.9–10.3)
Chloride: 106 mmol/L (ref 98–111)
Creatinine, Ser: 0.8 mg/dL (ref 0.44–1.00)
GFR, Estimated: >60 mL/min (ref >60)
Glucose, Bld: 103 mg/dL — ABNORMAL HIGH (ref 70–99)
Potassium: 4 mmol/L (ref 3.5–5.1)
Sodium: 139 mmol/L (ref 135–145)

## 2020-03-13 LAB — CBC WITH DIFFERENTIAL/PLATELET
Abs Immature Granulocytes: 0.02 10*3/uL (ref 0.00–0.07)
Basophils Absolute: 0.1 10*3/uL (ref 0.0–0.1)
Basophils Relative: 1 %
Eosinophils Absolute: 0.4 10*3/uL (ref 0.0–0.5)
Eosinophils Relative: 6 %
HCT: 38.9 % (ref 36.0–46.0)
Hemoglobin: 13.1 g/dL (ref 12.0–15.0)
Immature Granulocytes: 0 %
Lymphocytes Relative: 38 %
Lymphs Abs: 2.4 10*3/uL (ref 0.7–4.0)
MCH: 30.5 pg (ref 26.0–34.0)
MCHC: 33.7 g/dL (ref 30.0–36.0)
MCV: 90.7 fL (ref 80.0–100.0)
Monocytes Absolute: 0.7 10*3/uL (ref 0.1–1.0)
Monocytes Relative: 11 %
Neutro Abs: 2.9 10*3/uL (ref 1.7–7.7)
Neutrophils Relative %: 44 %
Platelets: 249 10*3/uL (ref 150–400)
RBC: 4.29 MIL/uL (ref 3.87–5.11)
RDW: 12 % (ref 11.5–15.5)
WBC: 6.4 10*3/uL (ref 4.0–10.5)
nRBC: 0 % (ref 0.0–0.2)

## 2020-03-13 LAB — MAGNESIUM: Magnesium: 1.9 mg/dL (ref 1.7–2.4)

## 2020-03-13 MED ORDER — APIXABAN 5 MG PO TABS: 5.0000 mg | ORAL_TABLET | Freq: Two times a day (BID) | ORAL | 0 refills | Status: DC

## 2020-03-13 NOTE — Progress Notes (Addendum)
  EP was asked to evaluate [name] Good for placement of an implantable loop recorder to monitor for atrial fibrillation by Dr Roda Shutters.  The patient was admitted on 03/08/2020 with right sided vision loss x 6 days.   Imaging demonstrated acute to subacute left PCA infarct with left P1 occlusion. They have undergone workup for stroke including:  CT head - Acute to subacute left occipital cortical infarct, new since previous studies. Old right temporo-occipital infarct.  CTA H&N - Occlusion of the left vertebral artery at its origin with reconstitution in the mid cervical region by cervical collaterals as a small vessel that does show flow to the foramen magnum. 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   MRI head - left PCA scattered large infarct  2D Echo - EF 60 to 65%, LV no regional wall motion abnormalities  Pt underwent TEE 03/12/2020 that showed resolution of previously noted LV thrombus, for which she was treated with Eliquis.   Per Neuro note:   TEE revealed no LV clot, no clot in appendage, good LV systolic function, mild LVH, mild MR and TR. Minimal PI. Given LV thrombus has resolved, spoke to cardiology about necessity for [facility] it was recommended the patient be on either full strength ASA or Plavix. Will continue with full strength ASA.    The patient has been monitored on telemetry which has demonstrated sinus rhythm with no arrhythmias.   I spoke with patient who declines consideration for LOOP, but agreed to 30 day monitoring.    I discussed this with Dr. Algie Coffer who follows her as an outpatient. He spoke to her this morning and she has agreed to resume anticoagulation (unclear plan for timing). He states he will place monitor via his office at later date if warranted.    No role for EP at this time. Please call back with questions.   Casimiro Needle [address]" [place], PA-C  03/13/2020 9:45 AM

## 2020-03-13 NOTE — Discharge Summary (Signed)
Physician Discharge Summary  [name] Good ZOX:[id number] DOB: 03/17/1951 DOA: 03/08/2020  PCP: Aida Puffer, MD  Admit date: 03/08/2020 Discharge date: 03/13/2020  Admitted From: Home Discharge disposition: Home with home health   Code Status: Full Code  Diet Recommendation: Cardiac diet  Discharge Diagnosis:   Principal Problem:   Acute embolic stroke [facility]) Active Problems:   History of restless legs syndrome   Left ventricular apical thrombus   Acute lower UTI  History of Present Illness / Brief narrative:  [name] Good a 69 y.o.femalewith medical history significant ofprior MI, 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 Started on Eliquis. LV thrombus still present on repeat office visit 3 months later per daughter.  Pt reports compliance with eliquis.  Pt presented to ED on 11/12 for confusion / AMS,Loss of vision / blurry vision to the R side. Had argument with husband on Sat night, headache onset after this. Laid down to rest and headache resolved, but after this she noticed vision changes and felt confused.  No extremity weakness, no numbness nor tingling in extremities, no speech difficulty.  Daughter reported increasing confusion during phone conversations since Sat.   CT head in the ER revealed an acute to subacute left occipital cortical infarct, new since previous imaging. Stable chronic small vessel ischemic changes also noted throughout.  Subjective:  Seen and examined this morning.  Pleasant elderly Caucasian female.  Wants to go home.  Does not want loop recorder placed today as previously planned.Marland Kitchen  Hospital Course:  Acute embolic stroke [facility]) - MRI brain showed new acute Left PCA infarcts (involves occipital, posteromedial temporal lobe, and left thalamus).Chronic right parieto-occipital infarct noted - CTA head/neck shows occluded left vertebral artery  but reconstitution distally and patent right VA; extensive chronic small vessel changes -Cardiology consulted given recurrent stroke with previous LV thrombus(not present on TTE).Underwent TEE on 11/16.  Negative for clots. Patient was planned for loop recorder placement to rule out A. fib.  Patient refused to get the procedure done this morning.  She will follow up with cardiology as an outpatient. -Patient was previously on Eliquis as an outpatient.  Per neurology and cardiology recommendation, she will be continued on the same for now.  She will get Holter monitoring as an outpatient and further decision on anticoagulation versus antiplatelet.  History of left ventricular apical thrombus -Continue Eliquis. -current TTE and TEE do not reveal residual LV thrombus  Essential hypertension -Prior to admission, patient was on metoprolol and losartan.  Both of them are on hold since admission 5 days ago.  Patient's heart rate and blood pressure have been stable without these medications.  I would not resume them at this time.  I have instructed the patient to monitor heart rate and blood pressure at home.  If the blood pressure exceeds 140/90, she would resume preferably metoprolol at first and then losartan if needed.  Acute lower UTI -Urine culture grew E. coli.  Completed a course of antibiotics.  History of restless legs syndrome - continue requip  Stable discharge to home with home health.  Wound care:    Discharge Exam:   Vitals:   03/12/20 2028 03/13/20 0005 03/13/20 0304 03/13/20 0739  BP: 116/75 131/79 133/69 128/76  Pulse: 78 81 71 84  Resp: 19 15 16 18   Temp: 97.7 F (36.5 C) 97.8 F (36.6 C) 98 F (36.7 C) 98.1 F (36.7 C)  TempSrc:  Oral Oral Oral Oral  SpO2: 100% 97% 98% 98%  Weight:      Height:        Body mass index is 30.65 kg/m.  General exam: Appears calm and comfortable.  Not in distress Skin: No rashes, lesions or ulcers. HEENT: Atraumatic,  normocephalic, supple neck, no obvious bleeding Lungs: Clear to auscultation bilaterally CVS: Regular rate and rhythm, no murmur GI/Abd soft, nontender, nondistended, bowel sound present CNS: Alert, awake, oriented x3 Psychiatry: Mood appropriate Extremities: No pedal edema, no calf tenderness  Follow ups:   Discharge Instructions    Ambulatory referral to Neurology   Complete by: As directed    An appointment is requested in approximately: 3 weeks. Patient hospitalized with L PCA stroke and has hx of prior stroke needs to be seen by the stroke doctor on an outpatient basis   Ambulatory referral to Physical Medicine Rehab   Complete by: As directed    For outpatient rehabilitation management post-stroke   Diet - low sodium heart healthy   Complete by: As directed    Increase activity slowly   Complete by: As directed       Follow-up Information    [facility] Follow up.   Specialty: Home Health Services Why: The home health agency will contact you for the first home visit. Contact information: [address] [place] [address] [phone]        Orpah Cobb, MD. Schedule an appointment as soon as possible for a visit in 1 week(s).   Specialty: Cardiology Contact information: [address] [place] [address] [phone]        Aida Puffer, MD Follow up.   Specialty: Family Medicine Contact information: [address] [place] [address] [phone]               Recommendations for Outpatient Follow-Up:   1. Follow-up with PCP as an outpatient 2. Follow-up with cardiology as an outpatient. 3. Follow-up with neurology as an outpatient  Discharge Instructions:  Follow with Primary MD Aida Puffer, MD in 7 days   Get CBC/BMP checked in next visit within 1 week by PCP or SNF MD ( we routinely change or add medications that can affect your baseline labs and fluid status, therefore we recommend that you get the mentioned  basic workup next visit with your PCP, your PCP may decide not to get them or add new tests based on their clinical decision)  On your next visit with your PCP, please Get Medicines reviewed and adjusted.  Please request your PCP  to go over all Hospital Tests and Procedure/Radiological results at the follow up, please get all Hospital records sent to your Prim MD by signing hospital release before you go home.  Activity: As tolerated with Full fall precautions use walker/cane & assistance as needed  For Heart failure patients - Check your Weight same time everyday, if you gain over 2 pounds, or you develop in leg swelling, experience more shortness of breath or chest pain, call your Primary MD immediately. Follow Cardiac Low Salt Diet and 1.5 lit/day fluid restriction.  If you have smoked or chewed Tobacco in the last 2 yrs please stop smoking, stop any regular Alcohol  and or any Recreational drug use.  If you experience worsening of your admission symptoms, develop shortness of breath, life threatening emergency, suicidal or homicidal thoughts you must seek medical attention immediately by calling 911 or calling your MD immediately  if symptoms  less severe.  You Must read complete instructions/literature along with all the possible adverse reactions/side effects for all the Medicines you take and that have been prescribed to you. Take any new Medicines after you have completely understood and accpet all the possible adverse reactions/side effects.   Do not drive, operate heavy machinery, perform activities at heights, swimming or participation in water activities or provide baby sitting services if your were admitted for syncope or siezures until you have seen by Primary MD or a Neurologist and advised to do so again.  Do not drive when taking Pain medications.  Do not take more than prescribed Pain, Sleep and Anxiety Medications  Wear Seat belts while driving.   Please note You were cared  for by a hospitalist during your hospital stay. If you have any questions about your discharge medications or the care you received while you were in the hospital after you are discharged, you can call the unit and asked to speak with the hospitalist on call if the hospitalist that took care of you is not available. Once you are discharged, your primary care physician will handle any further medical issues. Please note that NO REFILLS for any discharge medications will be authorized once you are discharged, as it is imperative that you return to your primary care physician (or establish a relationship with a primary care physician if you do not have one) for your aftercare needs so that they can reassess your need for medications and monitor your lab values.    Allergies as of 03/13/2020      Reactions   Amoxicillin Other (See Comments)   Tolerated Zosyn Has patient had a PCN reaction causing immediate rash, facial/tongue/throat swelling, SOB or lightheadedness with hypotension: No Has patient had a PCN reaction causing severe rash involving mucus membranes or skin necrosis: No Has patient had a PCN reaction that required hospitalization No Has patient had a PCN reaction occurring within the last 10 years: No If all of the above answers are "NO", then may proceed with Cephalosporin use.      Medication List    STOP taking these medications   acetaminophen 325 MG tablet Commonly known as: TYLENOL   furosemide 40 MG tablet Commonly known as: LASIX   gabapentin 400 MG capsule Commonly known as: NEURONTIN   losartan 25 MG tablet Commonly known as: COZAAR   metoprolol succinate 25 MG 24 hr tablet Commonly known as: TOPROL-XL     TAKE these medications   apixaban 5 MG Tabs tablet Commonly known as: ELIQUIS Take 1 tablet (5 mg total) by mouth 2 (two) times daily.   atorvastatin 80 MG tablet Commonly known as: LIPITOR Take 1 tablet (80 mg total) by mouth daily.   calcium carbonate  750 MG chewable tablet Commonly known as: TUMS EX Chew 1 tablet by mouth 2 (two) times daily as needed for heartburn.   magnesium oxide 400 MG tablet Commonly known as: MAG-OX Take 1 tablet (400 mg total) by mouth daily.   oxyCODONE-acetaminophen 5-325 MG tablet Commonly known as: PERCOCET/ROXICET Take 1 tablet by mouth every 6 (six) hours as needed for moderate pain or severe pain.   rOPINIRole 3 MG tablet Commonly known as: REQUIP Take 1 tablet (3 mg total) by mouth at bedtime as needed (restless leg).   tetrahydrozoline 0.05 % ophthalmic solution Place 1 drop into both eyes daily as needed (Dry eyes).       Time coordinating discharge: 35 minutes  The results of significant  diagnostics from this hospitalization (including imaging, microbiology, ancillary and laboratory) are listed below for reference.    Procedures and Diagnostic Studies:   CT ANGIO HEAD W OR WO CONTRAST  Result Date: 03/09/2020 CLINICAL DATA:  Blurred vision and dizziness. Likely recent left occipital stroke by CT. EXAM: CT ANGIOGRAPHY HEAD AND NECK TECHNIQUE: Multidetector CT imaging of the head and neck was performed using the standard protocol during bolus administration of intravenous contrast. Multiplanar CT image reconstructions and MIPs were obtained to evaluate the vascular anatomy. Carotid stenosis measurements (when applicable) are obtained utilizing NASCET criteria, using the distal internal carotid diameter as the denominator. CONTRAST:  50mL OMNIPAQUE IOHEXOL 350 MG/ML SOLN COMPARISON:  Head CT yesterday.  MRI 10/02/2019 FINDINGS: CT HEAD FINDINGS Brain: Brainstem and cerebellum are unremarkable. Acute to subacute infarctions in the left occipital lobe and left parietooccipital junction. Chronic infarction in the right parieto-occipital junction. Extensive chronic small-vessel ischemic changes throughout the cerebral hemispheric white matter, basal ganglia and thalami. No hemorrhage or mass effect. No  hydrocephalus or extra-axial collection. Vascular: There is atherosclerotic calcification of the major vessels at the base of the brain. Skull: Negative Sinuses: Mild mucosal inflammatory changes. Orbits: Negative Review of the MIP images confirms the above findings CTA NECK FINDINGS Aortic arch: Aortic atherosclerotic calcification. No aneurysm or dissection. Branching pattern is normal. Right carotid system: Common carotid artery shows atherosclerotic plaque but is sufficiently patent to the bifurcation region. Calcification at the carotid bifurcation and ICA bulb. Minimal diameter at the distal bulb is 2.5 mm. Compared to a more distal cervical ICA diameter of 5 mm, this indicates a 50% stenosis. Left carotid system: Common carotid artery shows scattered plaque but is sufficiently patent to the bifurcation region. Calcified plaque at the carotid bifurcation and ICA bulb. No measurable stenosis. Cervical ICA widely patent. Vertebral arteries: Atherosclerotic change of the right subclavian artery proximal to the vertebral artery origin but without flow limiting stenosis. Right vertebral artery origin widely patent. Right vertebral artery is the dominant vessel. There is scattered plaque in the cervical region but no significant stenosis. Left vertebral artery is occluded at its origin and reconstitutes in the mid cervical region by cervical collaterals as a small vessel that does show flow to the foramen magnum. Skeleton: Mild cervical spondylosis. Other neck: No soft tissue mass or lymphadenopathy. Upper chest: Mild scarring at the lung apices. Review of the MIP images confirms the above findings CTA HEAD FINDINGS Anterior circulation: Both internal carotid arteries are patent through the skull base and siphon regions. There is siphon atherosclerotic calcification but no stenosis greater than 30%. The anterior and middle cerebral vessels are patent without large or medium vessel occlusion or correctable proximal  stenosis. No aneurysm or vascular malformation. Patent posterior communicating artery on the right. Posterior circulation: Dominant right vertebral artery widely patent to the basilar. Small left vertebral artery shows some flow through the foramen magnum with supply to the left PICA. Minimal flow present in a small distal left vertebral artery. No basilar stenosis. Both superior cerebellar arteries show flow. There is occlusion of the left P1 segment 1 cm from its origin. Left PCA is patent, with some distal vessel atherosclerotic irregularity. Venous sinuses: Patent and normal. Anatomic variants: None other significant. Review of the MIP images confirms the above findings IMPRESSION: 1. Aortic atherosclerosis. 2. Atherosclerotic disease at both carotid bifurcations. 50% stenosis of the distal bulb on the right. No measurable stenosis on the left. 3. Occlusion of the left vertebral artery at its  origin with reconstitution in the mid cervical region by cervical collaterals as a small vessel that does show flow to the foramen magnum. Dominant right vertebral artery widely patent to the basilar. 4. 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 5. Extensive chronic small-vessel ischemic changes throughout the cerebral hemispheric white matter, basal ganglia and thalami. Aortic Atherosclerosis (ICD10-I70.0). Electronically Signed   By: Paulina Fusi M.D.   On: 03/09/2020 09:06   DG Chest 2 View  Result Date: 03/08/2020 CLINICAL DATA:  Blurred vision, dizziness, left occipital infarct EXAM: CHEST - 2 VIEW COMPARISON:  10/16/2017 FINDINGS: The heart size and mediastinal contours are within normal limits. Both lungs are clear. The visualized skeletal structures are unremarkable. IMPRESSION: No active cardiopulmonary disease. Electronically Signed   By: Sharlet Salina M.D.   On: 03/08/2020 21:13   CT HEAD WO CONTRAST  Result Date: 03/08/2020 CLINICAL DATA:  Blurred vision, dizziness EXAM: CT HEAD WITHOUT  CONTRAST TECHNIQUE: Contiguous axial images were obtained from the base of the skull through the vertex without intravenous contrast. COMPARISON:  10/01/2019 FINDINGS: Brain: Since the prior exam, hypodensity has developed in the left occipital region, primarily in the subcortical white matter, consistent with acute to subacute infarct. Areas of chronic ischemic change are again seen throughout the periventricular white matter, thalami, and right temporal occipital region. No acute hemorrhage. Lateral ventricles and remaining midline structures are unremarkable. No acute extra-axial fluid collections. No mass effect. Vascular: No hyperdense vessel or unexpected calcification. Skull: Normal. Negative for fracture or focal lesion. Sinuses/Orbits: Chronic mucosal thickening within the left maxillary sinus, right sphenoid sinus, and ethmoid air cells. Other: None. IMPRESSION: 1. Acute to subacute left occipital cortical infarct, new since previous studies. 2. Stable chronic small-vessel ischemic changes throughout the white matter, thalami, and right temporal occipital region. 3. No acute hemorrhage. These results were called by telephone at the time of interpretation on 03/08/2020 at 7:13pm to provider [facility] , who verbally acknowledged these results. Electronically Signed   By: Sharlet Salina M.D.   On: 03/08/2020 19:16   CT ANGIO NECK W OR WO CONTRAST  Result Date: 03/09/2020 CLINICAL DATA:  Blurred vision and dizziness. Likely recent left occipital stroke by CT. EXAM: CT ANGIOGRAPHY HEAD AND NECK TECHNIQUE: Multidetector CT imaging of the head and neck was performed using the standard protocol during bolus administration of intravenous contrast. Multiplanar CT image reconstructions and MIPs were obtained to evaluate the vascular anatomy. Carotid stenosis measurements (when applicable) are obtained utilizing NASCET criteria, using the distal internal carotid diameter as the denominator. CONTRAST:   67mL OMNIPAQUE IOHEXOL 350 MG/ML SOLN COMPARISON:  Head CT yesterday.  MRI 10/02/2019 FINDINGS: CT HEAD FINDINGS Brain: Brainstem and cerebellum are unremarkable. Acute to subacute infarctions in the left occipital lobe and left parietooccipital junction. Chronic infarction in the right parieto-occipital junction. Extensive chronic small-vessel ischemic changes throughout the cerebral hemispheric white matter, basal ganglia and thalami. No hemorrhage or mass effect. No hydrocephalus or extra-axial collection. Vascular: There is atherosclerotic calcification of the major vessels at the base of the brain. Skull: Negative Sinuses: Mild mucosal inflammatory changes. Orbits: Negative Review of the MIP images confirms the above findings CTA NECK FINDINGS Aortic arch: Aortic atherosclerotic calcification. No aneurysm or dissection. Branching pattern is normal. Right carotid system: Common carotid artery shows atherosclerotic plaque but is sufficiently patent to the bifurcation region. Calcification at the carotid bifurcation and ICA bulb. Minimal diameter at the distal bulb is 2.5 mm. Compared to a  more distal cervical ICA diameter of 5 mm, this indicates a 50% stenosis. Left carotid system: Common carotid artery shows scattered plaque but is sufficiently patent to the bifurcation region. Calcified plaque at the carotid bifurcation and ICA bulb. No measurable stenosis. Cervical ICA widely patent. Vertebral arteries: Atherosclerotic change of the right subclavian artery proximal to the vertebral artery origin but without flow limiting stenosis. Right vertebral artery origin widely patent. Right vertebral artery is the dominant vessel. There is scattered plaque in the cervical region but no significant stenosis. Left vertebral artery is occluded at its origin and reconstitutes in the mid cervical region by cervical collaterals as a small vessel that does show flow to the foramen magnum. Skeleton: Mild cervical spondylosis.  Other neck: No soft tissue mass or lymphadenopathy. Upper chest: Mild scarring at the lung apices. Review of the MIP images confirms the above findings CTA HEAD FINDINGS Anterior circulation: Both internal carotid arteries are patent through the skull base and siphon regions. There is siphon atherosclerotic calcification but no stenosis greater than 30%. The anterior and middle cerebral vessels are patent without large or medium vessel occlusion or correctable proximal stenosis. No aneurysm or vascular malformation. Patent posterior communicating artery on the right. Posterior circulation: Dominant right vertebral artery widely patent to the basilar. Small left vertebral artery shows some flow through the foramen magnum with supply to the left PICA. Minimal flow present in a small distal left vertebral artery. No basilar stenosis. Both superior cerebellar arteries show flow. There is occlusion of the left P1 segment 1 cm from its origin. Left PCA is patent, with some distal vessel atherosclerotic irregularity. Venous sinuses: Patent and normal. Anatomic variants: None other significant. Review of the MIP images confirms the above findings IMPRESSION: 1. Aortic atherosclerosis. 2. Atherosclerotic disease at both carotid bifurcations. 50% stenosis of the distal bulb on the right. No measurable stenosis on the left. 3. Occlusion of the left vertebral artery at its origin with reconstitution in the mid cervical region by cervical collaterals as a small vessel that does show flow to the foramen magnum. Dominant right vertebral artery widely patent to the basilar. 4. 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 5. Extensive chronic small-vessel ischemic changes throughout the cerebral hemispheric white matter, basal ganglia and thalami. Aortic Atherosclerosis (ICD10-I70.0). Electronically Signed   By: Paulina Fusi M.D.   On: 03/09/2020 09:06   MR BRAIN WO CONTRAST  Result Date: 03/09/2020 CLINICAL DATA:   Confusion and right-sided vision loss/blurry vision. History of stroke. EXAM: MRI HEAD WITHOUT CONTRAST TECHNIQUE: Multiplanar, multiecho pulse sequences of the brain and surrounding structures were obtained without intravenous contrast. COMPARISON:  Head and neck CTA 03/09/2020.  Head MRI 10/02/2019. FINDINGS: Brain: There are acute left PCA territory infarcts involving the occipital lobe, posteromedial temporal lobe, and left thalamus. A small chronic right parieto-occipital infarct is again noted with a small amount of associated chronic blood products. Patchy to confluent T2 hyperintensities in the cerebral white matter bilaterally, deep gray nuclei, and brainstem are similar to the prior MRI and are nonspecific but compatible with severe chronic small vessel ischemic disease. A nonenlarged partially empty sella is unchanged. There is mild-to-moderate cerebral atrophy. No mass, midline shift, or extra-axial fluid collection is identified. Vascular: Small and poorly visualized distal left vertebral artery, more fully evaluated on today's CTA. Skull and upper cervical spine: Unremarkable bone marrow signal. Sinuses/Orbits: Bilateral cataract extraction. Left maxillary sinus mucous retention cyst. Mild mucosal thickening in the other paranasal  sinuses. Trace left mastoid fluid. Other: None. IMPRESSION: 1. Patchy acute left PCA territory infarcts. 2. Severe chronic small vessel ischemic disease with multiple old infarcts as above. Electronically Signed   By: Sebastian Ache M.D.   On: 03/09/2020 16:50   ECHOCARDIOGRAM COMPLETE  Result Date: 03/09/2020    ECHOCARDIOGRAM REPORT   Patient Name:   LUDA CHARBONNEAU Date of Exam: 03/09/2020 Medical Rec #:  [id number]      Height:       63.0 in Accession #:    [id number]     Weight:       173.0 lb Date of Birth:  22-Sep-1950      BSA:          1.818 m Patient Age:    69 years       BP:           122/78 mmHg Patient Gender: F              HR:           84 bpm. Exam Location:   Inpatient Procedure: 2D Echo, Cardiac Doppler and Color Doppler Indications:    Stroke 434.91 / I163.9  History:        Patient has prior history of Echocardiogram examinations, most                 recent 10/02/2019. CHF, Previous Myocardial Infarction and CAD,                 Stroke and COPD, Signs/Symptoms:Dyspnea; Risk                 Factors:Hypertension and Diabetes.  Sonographer:    Eulah Pont RDCS Referring Phys: [phone] JARED M GARDNER IMPRESSIONS  1. Left ventricular ejection fraction, by estimation, is 60 to 65%. The left ventricle has normal function. The left ventricle has no regional wall motion abnormalities. Left ventricular diastolic parameters are consistent with Grade I diastolic dysfunction (impaired relaxation).  2. Right ventricular systolic function is normal. The right ventricular size is normal. There is normal pulmonary artery systolic pressure. The estimated right ventricular systolic pressure is 23.1 mmHg.  3. The mitral valve is normal in structure. No evidence of mitral valve regurgitation. No evidence of mitral stenosis.  4. The aortic valve was not well visualized. Aortic valve regurgitation is not visualized. No aortic stenosis is present.  5. The inferior vena cava is normal in size with greater than 50% respiratory variability, suggesting right atrial pressure of 3 mmHg. FINDINGS  Left Ventricle: Left ventricular ejection fraction, by estimation, is 60 to 65%. The left ventricle has normal function. The left ventricle has no regional wall motion abnormalities. The left ventricular internal cavity size was normal in size. There is  no left ventricular hypertrophy. Left ventricular diastolic parameters are consistent with Grade I diastolic dysfunction (impaired relaxation). Normal left ventricular filling pressure. Right Ventricle: The right ventricular size is normal. No increase in right ventricular wall thickness. Right ventricular systolic function is normal. There is normal  pulmonary artery systolic pressure. The tricuspid regurgitant velocity is 2.24 m/s, and  with an assumed right atrial pressure of 3 mmHg, the estimated right ventricular systolic pressure is 23.1 mmHg. Left Atrium: Left atrial size was normal in size. Right Atrium: Right atrial size was normal in size. Pericardium: There is no evidence of pericardial effusion. Mitral Valve: The mitral valve is normal in structure. Mild to moderate mitral annular calcification. No evidence of mitral valve regurgitation.  No evidence of mitral valve stenosis. Tricuspid Valve: The tricuspid valve is normal in structure. Tricuspid valve regurgitation is trivial. No evidence of tricuspid stenosis. Aortic Valve: The aortic valve was not well visualized. Aortic valve regurgitation is not visualized. No aortic stenosis is present. Pulmonic Valve: The pulmonic valve was normal in structure. Pulmonic valve regurgitation is not visualized. No evidence of pulmonic stenosis. Aorta: The aortic root is normal in size and structure. Venous: The inferior vena cava is normal in size with greater than 50% respiratory variability, suggesting right atrial pressure of 3 mmHg. IAS/Shunts: No atrial level shunt detected by color flow Doppler.  LEFT VENTRICLE PLAX 2D LVIDd:         4.50 cm  Diastology LVIDs:         3.40 cm  LV e' medial:    5.44 cm/s LV PW:         0.90 cm  LV E/e' medial:  11.4 LV IVS:        1.10 cm  LV e' lateral:   6.53 cm/s LVOT diam:     1.90 cm  LV E/e' lateral: 9.5 LV SV:         60 LV SV Index:   33 LVOT Area:     2.84 cm  RIGHT VENTRICLE RV S prime:     14.50 cm/s TAPSE (M-mode): 2.2 cm LEFT ATRIUM             Index       RIGHT ATRIUM          Index LA diam:        3.20 cm 1.76 cm/m  RA Area:     8.71 cm LA Vol (A2C):   40.6 ml 22.33 ml/m RA Volume:   14.90 ml 8.20 ml/m LA Vol (A4C):   21.4 ml 11.77 ml/m LA Biplane Vol: 31.0 ml 17.05 ml/m  AORTIC VALVE LVOT Vmax:   116.00 cm/s LVOT Vmean:  76.800 cm/s LVOT VTI:    0.210 m   AORTA Ao Root diam: 2.90 cm Ao Asc diam:  3.00 cm MITRAL VALVE               TRICUSPID VALVE MV Area (PHT): 2.24 cm    TR Peak grad:   20.1 mmHg MV Decel Time: 338 msec    TR Vmax:        224.00 cm/s MV E velocity: 62.10 cm/s MV A velocity: 96.00 cm/s  SHUNTS MV E/A ratio:  0.65        Systemic VTI:  0.21 m                            Systemic Diam: 1.90 cm Armanda Magicraci Turner MD Electronically signed by Armanda Magicraci Turner MD Signature Date/Time: 03/09/2020/1:10:38 PM    Final      Labs:   Basic Metabolic Panel: Recent Labs  Lab 03/08/20 1713 03/08/20 1713 03/10/20 0116 03/10/20 0116 03/11/20 0430 03/11/20 0430 03/12/20 0427 03/13/20 0328  NA 141  --  139  --  138  --  141 139  K 3.9   < > 3.6   < > 3.8   < > 3.6 4.0  CL 107  --  108  --  107  --  108 106  CO2 24  --  22  --  23  --  23 25  GLUCOSE 94  --  105*  --  110*  --  93 103*  BUN 10  --  17  --  15  --  14 14  CREATININE 0.80  --  0.85  --  0.85  --  0.85 0.80  CALCIUM 9.7  --  9.2  --  9.1  --  9.3 9.4  MG  --   --  2.1  --  2.0  --  2.1 1.9   < > = values in this interval not displayed.   GFR Estimated Creatinine Clearance: 64.9 mL/min (by C-G formula based on SCr of 0.8 mg/dL). Liver Function Tests: Recent Labs  Lab 03/08/20 1713  AST 23  ALT 21  ALKPHOS 96  BILITOT 0.8  PROT 7.1  ALBUMIN 3.9   No results for input(s): LIPASE, AMYLASE in the last 168 hours. No results for input(s): AMMONIA in the last 168 hours. Coagulation profile Recent Labs  Lab 03/08/20 1713  INR 1.2    CBC: Recent Labs  Lab 03/08/20 1713 03/10/20 0116 03/11/20 0430 03/12/20 0427 03/13/20 0328  WBC 8.5 7.8 6.5 5.3 6.4  NEUTROABS 4.9 3.7 2.9 2.1 2.9  HGB 14.6 13.1 13.6 13.0 13.1  HCT 44.2 39.1 40.5 38.6 38.9  MCV 92.7 92.7 90.6 91.5 90.7  PLT 244 235 263 251 249   Cardiac Enzymes: No results for input(s): CKTOTAL, CKMB, CKMBINDEX, TROPONINI in the last 168 hours. BNP: Invalid input(s): POCBNP CBG: No results for input(s): GLUCAP  in the last 168 hours. D-Dimer No results for input(s): DDIMER in the last 72 hours. Hgb A1c No results for input(s): HGBA1C in the last 72 hours. Lipid Profile No results for input(s): CHOL, HDL, LDLCALC, TRIG, CHOLHDL, LDLDIRECT in the last 72 hours. Thyroid function studies No results for input(s): TSH, T4TOTAL, T3FREE, THYROIDAB in the last 72 hours.  Invalid input(s): FREET3 Anemia work up No results for input(s): VITAMINB12, FOLATE, FERRITIN, TIBC, IRON, RETICCTPCT in the last 72 hours. Microbiology Recent Results (from the past 240 hour(s))  Urine culture     Status: Abnormal   Collection Time: 03/08/20  5:55 PM   Specimen: Urine, Random  Result Value Ref Range Status   Specimen Description URINE, RANDOM  Final   Special Requests   Final    NONE Performed at [facility], [address] [place], [place] [address]    Culture >=100,000 COLONIES/mL ESCHERICHIA COLI (A)  Final   Report Status 03/10/2020 FINAL  Final   Organism ID, Bacteria ESCHERICHIA COLI (A)  Final      Susceptibility   Escherichia coli - MIC*    AMPICILLIN >=32 RESISTANT Resistant     CEFAZOLIN <=4 SENSITIVE Sensitive     CEFEPIME <=0.12 SENSITIVE Sensitive     CEFTRIAXONE <=0.25 SENSITIVE Sensitive     CIPROFLOXACIN >=4 RESISTANT Resistant     GENTAMICIN <=1 SENSITIVE Sensitive     IMIPENEM <=0.25 SENSITIVE Sensitive     NITROFURANTOIN <=16 SENSITIVE Sensitive     TRIMETH/SULFA >=320 RESISTANT Resistant     AMPICILLIN/SULBACTAM 4 SENSITIVE Sensitive     PIP/TAZO <=4 SENSITIVE Sensitive     * >=100,000 COLONIES/mL ESCHERICHIA COLI  Respiratory Panel by RT PCR (Flu A&B, Covid) - Nasopharyngeal Swab     Status: None   Collection Time: 03/08/20  9:04 PM   Specimen: Nasopharyngeal Swab  Result Value Ref Range Status   SARS Coronavirus 2 by RT PCR NEGATIVE NEGATIVE Final    Comment: (NOTE) SARS-CoV-2 target nucleic acids are NOT DETECTED.  The SARS-CoV-2 RNA is generally detectable  in upper  respiratoy specimens during the acute phase of infection. The lowest concentration of SARS-CoV-2 viral copies this assay can detect is 131 copies/mL. A negative result does not preclude SARS-Cov-2 infection and should not be used as the sole basis for treatment or other patient management decisions. A negative result may occur with  improper specimen collection/handling, submission of specimen other than nasopharyngeal swab, presence of viral mutation(s) within the areas targeted by this assay, and inadequate number of viral copies (<131 copies/mL). A negative result must be combined with clinical observations, patient history, and epidemiological information. The expected result is Negative.  Fact Sheet for Patients:  https://www.moore.com/  Fact Sheet for Healthcare Providers:  https://www.young.biz/  This test is no t yet approved or cleared by the [place] FDA and  has been authorized for detection and/or diagnosis of SARS-CoV-2 by FDA under an Emergency Use Authorization (EUA). This EUA will remain  in effect (meaning this test can be used) for the duration of the COVID-19 declaration under Section 564(b)(1) of the Act, 21 U.S.C. section 360bbb-3(b)(1), unless the authorization is terminated or revoked sooner.     Influenza A by PCR NEGATIVE NEGATIVE Final   Influenza B by PCR NEGATIVE NEGATIVE Final    Comment: (NOTE) The Xpert Xpress SARS-CoV-2/FLU/RSV assay is intended as an aid in  the diagnosis of influenza from Nasopharyngeal swab specimens and  should not be used as a sole basis for treatment. Nasal washings and  aspirates are unacceptable for Xpert Xpress SARS-CoV-2/FLU/RSV  testing.  Fact Sheet for Patients: https://www.moore.com/  Fact Sheet for Healthcare Providers: https://www.young.biz/  This test is not yet approved or cleared by the [place] FDA and  has been  authorized for detection and/or diagnosis of SARS-CoV-2 by  FDA under an Emergency Use Authorization (EUA). This EUA will remain  in effect (meaning this test can be used) for the duration of the  Covid-19 declaration under Section 564(b)(1) of the Act, 21  U.S.C. section 360bbb-3(b)(1), unless the authorization is  terminated or revoked. Performed at [facility], [address], [place], [place] [address]    Signed: Lorin Glass  [facility] 03/13/2020, 11:20 AM

## 2020-03-13 NOTE — Progress Notes (Signed)
This patient has been discharged from the unit via wheelchair. All IV and telemetry is intact and pt denies any pain. AVS documentation has been given and reviewed. Pt is sent in good spirits.

## 2020-03-13 NOTE — Progress Notes (Signed)
Physical Therapy Treatment Patient Details Name: [name] Good MRN: [id number] DOB: 21-Jul-1950 Today's Date: 03/13/2020    History of Present Illness 69 y.o. 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 Pt presented to the ED with c/o AMS and blurry vision R eye.    PT Comments    Pt is making progress towards her goals as she was able to navigate increased number of steps this date. However, her leg muscular strength and balance deficits are evident when negotiating stairs. This is due to her displaying unsteadiness and requiring extra time and minA with 1 HHA to navigate 4 steps when utilizing 1 hand rail to simulate her home set-up. The pt continues to display visual deficits that impact her safety with mobility, therefore educated pt on necessity to scan her surroundings during gait. Will continue to follow acutely. Current recommendations remain appropriate.    Follow Up Recommendations  Home health PT;Supervision/Assistance - 24 hour     Equipment Recommendations  None recommended by PT    Recommendations for Other Services       Precautions / Restrictions Precautions Precautions: Fall Restrictions Weight Bearing Restrictions: No    Mobility  Bed Mobility Overal bed mobility: Modified Independent Bed Mobility: Supine to Sit     Supine to sit: HOB elevated;Supervision     General bed mobility comments: Supervision for safety, but pt able to come to sit in appropriate time frame safely with use of bed rails and HOB elevated.  Transfers Overall transfer level: Needs assistance Equipment used: Rolling walker (2 wheeled) Transfers: Sit to/from Stand Sit to Stand: Supervision         General transfer comment: Supervision for safety to come to stand as pt reports and displays mild unsteadiness secondary to poor night's sleep, per pt.  Ambulation/Gait  Ambulation/Gait assistance: Min guard Gait Distance (Feet): 100 Feet Assistive device: Rolling walker (2 wheeled) Gait Pattern/deviations: Step-through pattern;Decreased stride length Gait velocity: decreased Gait velocity interpretation: 1.31 - 2.62 ft/sec, indicative of limited community ambulator General Gait Details: Cues provided to remain within RW as she tends to push RW distal anteriorly to her body, with momentary success. Cues provided to scan her surroundings to compensate for visual deficits.   Stairs Stairs: Yes Stairs assistance: Min assist Stair Management: One rail Left;One rail Right;Step to pattern Number of Stairs: 4 General stair comments: L rail ascending and R rail descending (to simulate home set-up), displaying step-to gait pattern. Increased time and cues required to sequence hand placement and proper leading of R leg ascending and L leg when descending. 1 HHA minA on therapist throughout for safety as pt reports only having 1 rail at home.   Wheelchair Mobility    Modified Rankin (Stroke Patients Only) Modified Rankin (Stroke Patients Only) Pre-Morbid Rankin Score: Slight disability Modified Rankin: Moderately severe disability     Balance Overall balance assessment: Needs assistance Sitting-balance support: Feet supported;No upper extremity supported Sitting balance-Leahy Scale: Good Sitting balance - Comments: Static sitting EOB no LOB, supervision for safety.   Standing balance support: Bilateral upper extremity supported;During functional activity Standing balance-Leahy Scale: Poor Standing balance comment: reliant on bil UE support                            Cognition Arousal/Alertness: Awake/alert Behavior During Therapy: WFL for tasks assessed/performed Overall Cognitive Status: Impaired/Different from baseline Area of  Impairment: Following commands                       Following Commands: Follows one step commands  consistently;Follows one step commands with increased time;Follows multi-step commands inconsistently       General Comments: Requires repeated cues for proper hand placement with stair negotiation. Pt emotional during session in regards to stress about her situation.      Exercises      General Comments        Pertinent Vitals/Pain Pain Assessment: No/denies pain Pain Intervention(s): Limited activity within patient's tolerance;Monitored during session;Repositioned    Home Living                      Prior Function            PT Goals (current goals can now be found in the care plan section) Acute Rehab PT Goals Patient Stated Goal: to get better and decrease her stress PT Goal Formulation: With patient Time For Goal Achievement: 03/23/20 Potential to Achieve Goals: Good Progress towards PT goals: Progressing toward goals    Frequency    Min 4X/week      PT Plan Current plan remains appropriate    Co-evaluation              AM-PAC PT "6 Clicks" Mobility   Outcome Measure  Help needed turning from your back to your side while in a flat bed without using bedrails?: None Help needed moving from lying on your back to sitting on the side of a flat bed without using bedrails?: None Help needed moving to and from a bed to a chair (including a wheelchair)?: None Help needed standing up from a chair using your arms (e.g., wheelchair or bedside chair)?: None Help needed to walk in hospital room?: A Little Help needed climbing 3-5 steps with a railing? : A Little 6 Click Score: 22    End of Session Equipment Utilized During Treatment: Gait belt Activity Tolerance: Patient tolerated treatment well Patient left: in chair;with call bell/phone within reach;with chair alarm set   PT Visit Diagnosis: Other abnormalities of gait and mobility (R26.89);Difficulty in walking, not elsewhere classified (R26.2);Unsteadiness on feet (R26.81);Other symptoms and signs  involving the nervous system (R29.898)     Time: 3016-0109 PT Time Calculation (min) (ACUTE ONLY): 26 min  Charges:  $Gait Training: 23-37 mins                     Raymond Gurney, PT, DPT Acute Rehabilitation Services  Pager: [phone] Office: [phone]    Jewel Baize 03/13/2020, 2:27 PM

## 2020-03-13 NOTE — Consult Note (Addendum)
Ref: [name] Puffer, MD   Subjective:  VS stable. Afebrile. Denies throat soreness. Vision trouble continues. TEE yesterday shows: 1. Resolution of apical wall motion abnormality as well as thrombus in LV cavity.                                        2. There is no PFO or LA appendage thrombus..                                       3. There is moderate to severe atherosclerosis of ascending, transverse and descending aorta.  Objective:  Vital Signs in the last 24 hours: Temp:  [97.5 F (36.4 C)-98.1 F (36.7 C)] 98.1 F (36.7 C) (11/17 0739) Pulse Rate:  [71-84] 84 (11/17 0739) Cardiac Rhythm: Normal sinus rhythm (11/16 1951) Resp:  [15-19] 18 (11/17 0739) BP: (101-134)/(64-86) 128/76 (11/17 0739) SpO2:  [95 %-100 %] 98 % (11/17 0739)  Physical Exam: BP Readings from Last 1 Encounters:  03/13/20 128/76     Wt Readings from Last 1 Encounters:  03/12/20 78.5 kg    Weight change:  Body mass index is 30.65 kg/m. HEENT: [place]/AT, Eyes-Light Brown, Conjunctiva-Pink, Sclera-Non-icteric Neck: No JVD, No bruit, Trachea midline. Lungs:  Clear, Bilateral. Mild wheezing on cough. Cardiac:  Regular rhythm, normal S1 and S2, no S3. II/VI systolic murmur. Abdomen:  Soft, non-tender. BS present. Extremities:  No edema present. No cyanosis. No clubbing. CNS: AxOx3, Cranial nerves grossly intact, moves all 4 extremities.  Skin: Warm and dry.   Intake/Output from previous day: No intake/output data recorded.    Lab Results: BMET    Component Value Date/Time   NA 139 03/13/2020 0328   NA 141 03/12/2020 0427   NA 138 03/11/2020 0430   K 4.0 03/13/2020 0328   K 3.6 03/12/2020 0427   K 3.8 03/11/2020 0430   CL 106 03/13/2020 0328   CL 108 03/12/2020 0427   CL 107 03/11/2020 0430   CO2 25 03/13/2020 0328   CO2 23 03/12/2020 0427   CO2 23 03/11/2020 0430   GLUCOSE 103 (H) 03/13/2020 0328   GLUCOSE 93 03/12/2020 0427   GLUCOSE 110 (H) 03/11/2020 0430   BUN 14 03/13/2020 0328    BUN 14 03/12/2020 0427   BUN 15 03/11/2020 0430   CREATININE 0.80 03/13/2020 0328   CREATININE 0.85 03/12/2020 0427   CREATININE 0.85 03/11/2020 0430   CALCIUM 9.4 03/13/2020 0328   CALCIUM 9.3 03/12/2020 0427   CALCIUM 9.1 03/11/2020 0430   GFRNONAA >60 03/13/2020 0328   GFRNONAA >60 03/12/2020 0427   GFRNONAA >60 03/11/2020 0430   GFRAA >60 10/18/2019 0545   GFRAA >60 10/11/2019 0610   GFRAA >60 10/06/2019 0541   CBC    Component Value Date/Time   WBC 6.4 03/13/2020 0328   RBC 4.29 03/13/2020 0328   HGB 13.1 03/13/2020 0328   HCT 38.9 03/13/2020 0328   PLT 249 03/13/2020 0328   MCV 90.7 03/13/2020 0328   MCH 30.5 03/13/2020 0328   MCHC 33.7 03/13/2020 0328   RDW 12.0 03/13/2020 0328   LYMPHSABS 2.4 03/13/2020 0328   MONOABS 0.7 03/13/2020 0328   EOSABS 0.4 03/13/2020 0328   BASOSABS 0.1 03/13/2020 0328   HEPATIC Function Panel Recent Labs    10/01/19 2056 10/06/19 0541  03/08/20 1713  PROT 6.1* 6.4* 7.1   HEMOGLOBIN A1C No components found for: HGA1C,  MPG CARDIAC ENZYMES Lab Results  Component Value Date   CKTOTAL 192 (H) 07/17/2016   BNP No results for input(s): PROBNP in the last 8760 hours. TSH No results for input(s): TSH in the last 8760 hours. CHOLESTEROL Recent Labs    10/02/19 0630 03/09/20 0215  CHOL 176 157    Scheduled Meds: . aspirin EC  325 mg Oral Daily  . atorvastatin  80 mg Oral Daily  . enoxaparin (LOVENOX) injection  40 mg Subcutaneous Q24H  . 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 Patient wants to go home as soon as possible. Increase  activity. F/U 1 week.   LOS: 5 days   Time spent including chart review, lab review, examination, discussion with patient : 30 min   Orpah Cobb  MD  03/13/2020, 8:50 AM

## 2020-03-13 NOTE — Progress Notes (Addendum)
STROKE TEAM PROGRESS NOTE   INTERVAL HISTORY Patient is up in bed talking with ST upon entering the room. She states that she does not want to have the ILR placed today and wants to leave the hospital and go home. Following up outpatient with neurology was discussed with her and she was amenable to that. Will place ambulatory referral so that she can see neurology on an outpatient basis after discharging from the hospital.    OBJECTIVE Vitals:   03/12/20 2028 03/13/20 0005 03/13/20 0304 03/13/20 0739  BP: 116/75 131/79 133/69 128/76  Pulse: 78 81 71 84  Resp: 19 15 16 18   Temp: 97.7 F (36.5 C) 97.8 F (36.6 C) 98 F (36.7 C) 98.1 F (36.7 C)  TempSrc: Oral Oral Oral Oral  SpO2: 100% 97% 98% 98%  Weight:      Height:        CBC:  Recent Labs  Lab 03/12/20 0427 03/13/20 0328  WBC 5.3 6.4  NEUTROABS 2.1 2.9  HGB 13.0 13.1  HCT 38.6 38.9  MCV 91.5 90.7  PLT 251 249    Basic Metabolic Panel:  Recent Labs  Lab 03/12/20 0427 03/13/20 0328  NA 141 139  K 3.6 4.0  CL 108 106  CO2 23 25  GLUCOSE 93 103*  BUN 14 14  CREATININE 0.85 0.80  CALCIUM 9.3 9.4  MG 2.1 1.9    Lipid Panel:     Component Value Date/Time   CHOL 157 03/09/2020 0215   TRIG 87 03/09/2020 0215   HDL 48 03/09/2020 0215   CHOLHDL 3.3 03/09/2020 0215   VLDL 17 03/09/2020 0215   LDLCALC 92 03/09/2020 0215   HgbA1c:  Lab Results  Component Value Date   HGBA1C 5.4 03/09/2020   Urine Drug Screen:     Component Value Date/Time   LABOPIA NONE DETECTED 03/08/2020 1755   COCAINSCRNUR NONE DETECTED 03/08/2020 1755   LABBENZ NONE DETECTED 03/08/2020 1755   AMPHETMU NONE DETECTED 03/08/2020 1755   THCU NONE DETECTED 03/08/2020 1755   LABBARB NONE DETECTED 03/08/2020 1755    Alcohol Level     Component Value Date/Time   ETH <10 03/08/2020 1724    IMAGING  CT ANGIO HEAD and Neck W OR WO CONTRAST 03/09/2020 1. Aortic atherosclerosis.  2. Atherosclerotic disease at both carotid  bifurcations. 50% stenosis of the distal bulb on the right. No measurable stenosis on the left.  3. Occlusion of the left vertebral artery at its origin with reconstitution in the mid cervical region by cervical collaterals as a small vessel that does show flow to the foramen magnum. Dominant right vertebral artery widely patent to the basilar.  4. 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  5. Extensive chronic small-vessel ischemic changes throughout the cerebral hemispheric white matter, basal ganglia and thalami.  Aortic Atherosclerosis (ICD10-I70.0). .   CT HEAD WO CONTRAST 03/08/2020 1. Acute to subacute left occipital cortical infarct, new since previous studies.  2. Stable chronic small-vessel ischemic changes throughout the white matter, thalami, and right temporal occipital region.  3. No acute hemorrhage.   MRI HEAD WO CONTRAST 03/09/20 1. Patchy acute left PCA territory infarcts. 2. Severe chronic small vessel ischemic disease with multiple old infarcts as above.  Transthoracic Echocardiogram 03/12/20 1. Left ventricular ejection fraction, by estimation, is 60 to 65%. The  left ventricle has normal function. The left ventricle has no regional  wall motion abnormalities. Left ventricular diastolic parameters are  consistent  with Grade I diastolic  dysfunction (impaired relaxation).  2. Right ventricular systolic function is normal. The right ventricular  size is normal. There is normal pulmonary artery systolic pressure. The  estimated right ventricular systolic pressure is 23.1 mmHg.  3. The mitral valve is normal in structure. No evidence of mitral valve  regurgitation. No evidence of mitral stenosis.  4. The aortic valve was not well visualized. Aortic valve regurgitation  is not visualized. No aortic stenosis is present.  5. The inferior vena cava is normal in size with greater than 50%  respiratory variability, suggesting right atrial pressure of 3  mmHg.   ECG - SR rate 87 BPM. (See cardiology reading for complete details)  PHYSICAL EXAM   Temp:  [97.6 F (36.4 C)-98.1 F (36.7 C)] 98.1 F (36.7 C) (11/17 0739) Pulse Rate:  [71-84] 84 (11/17 0739) Resp:  [15-19] 18 (11/17 0739) BP: (101-133)/(64-79) 128/76 (11/17 0739) SpO2:  [95 %-100 %] 98 % (11/17 0739)  General - Well nourished, well developed, in no apparent distress.  Ophthalmologic - fundi not visualized due to noncooperation.  Cardiovascular - Regular rhythm and rate.  Mental Status -  AAOx4, following commands  Language including expression, naming, repetition, comprehension was assessed and found intact.  Cranial Nerves II - XII - II - Right hemianopsia  III, IV, VI - Extraocular movements intact. V - Facial sensation intact bilaterally. VII - Facial movement intact bilaterally. VIII - Hearing & vestibular intact bilaterally. X - Palate elevates symmetrically. XI - Chin turning & shoulder shrug intact bilaterally. XII - Tongue protrusion intact.  Motor Strength - The patient's strength was symmetrical in all extremities and pronator drift was absent.  Bulk was normal and fasciculations were absent.   Motor Tone - Muscle tone was assessed at the neck and appendages and was normal.  Reflexes - The patient's reflexes were symmetrical in all extremities and she had no pathological reflexes.  Sensory - Light touch, temperature/pinprick were assessed and were symmetrical.    Coordination - The patient had normal movements in the hands with no ataxia or dysmetria.  Tremor was absent.  Gait and Station - Deferred.   ASSESSMENT/PLAN [name] is a 69 y.o. 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  She did not receive IV t-PA due to late presentation (>4.5 hours from time of onset)  Stroke: Acute to subacute left PCA infarct  with left P1 occlusion - embolic stroke of undetermined source   CT head - Acute to subacute left occipital cortical infarct, new since previous studies. Old right temporo-occipital infarct.  CTA H&N - Occlusion of the left vertebral artery at its origin with reconstitution in the mid cervical region by cervical collaterals as a small vessel that does show flow to the foramen magnum. 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   MRI head - left PCA scattered large infarct  2D Echo - EF 60 to 65%, LV no regional wall motion abnormalities  [employer] Virus 2 - negative  LDL - 92  HgbA1c - 5.4  UDS - negative  VTE prophylaxis - Lovenox  Eliquis (apixaban) daily prior to admission, now on ASA 325 mg. At discharge she is to resume Eliquis 5 mg BID as recommended by Cardiology while she is evaluated for atrial fibrillation with a Holter monitor on an outpatient  basis. Cardiology will potentially transition her to Plavix in the future based on outpatient holter monitoring.   Patient counseled to be compliant with her antithrombotic medications  Ongoing aggressive stroke risk factor management  Therapy recommendations: PT recommends home health w/24/7 assistance, OT recommending CIR.  Disposition:  Home- the patient expresses that she wishes to go home and does not want to go to CIR   Given the patients TEE was negative ( see more details below), and she has an embolic stroke of undetermined source an ILR was discussed with her. She was initially ok with having this placed after a discussion on 03/12/20 however the morning of 03/13/20 she adamantly declined wanting to have the ILR placed. The EP team discussed her having a Holter monitor placed on an outpatient basis and she was ok with that.   Hx of LV thrombus  09/2018 one 2D echo showed LV thrombus, EF 60 to 65%  This admission 2D echo showed no LV thrombus, EF 60 to 65%, LV no regional wall motion abnormalities  On  eliquis PTA  Now on ASA 325 due to large infarct left PCA  Cardiology consult Dr. Algie Coffer 11/14, TEE revealed no LV clot, no clot in appendage, good LV systolic function, mild LVH, mild MR and TR. Minimal PI. Given LV thrombus had resolved, spoke to cardiology, Dr. Algie Coffer on 03/12/20 about the necessity for [facility] and it was recommended the patient be on either full strength ASA or Plavix. On 03/13/20 further discussion was held regarding the need for anticoagulation with cardiology, with Dr. Algie Coffer, and ultimately cardiology recommended that the patient resume anticoagulation with Eliquis 5 mg BID and follow up with them outpatient.   History of stroke  09/2019 admitted for left-sided weakness. CT showed right temporal occipital infarct.  MRI showed right thalamic infarct. CT head and neck right ICA 50% stenosis.  EF 60 to 65% but found to have LV thrombus, put on Eliquis.  LDL 118 and A1c 5.7, discharged with Lipitor 80.  Hyperlipidemia  Home Lipid lowering medication: Lipitor 80 mg daily, this was continued this hospitalization   LDL 92, goal < 70  Continue statin at discharge  Other Stroke Risk Factors  Advanced age  Former cigarette smoker - quit 6 years ago  Previous ETOH use  Obesity, Body mass index is 30.65 kg/m., recommend weight loss, diet and exercise as appropriate   Family hx stroke (mother)   Coronary artery disease - prior Mi  Other Active Problems  Code status - Full code   UTI - Rocephin -> Keflex started 11/14 for 3 doses.  Aortic Atherosclerosis (ICD10-I70.0).   RLS on requip Qhs  Hospital day # 5   Patient seen and discussed with attending physician Dr. Carrie Mew, NP  [facility] Nurse Practitioner  03/13/2020 11:11 AM  ATTENDING NOTE: I reviewed above note and agree with the assessment and plan.   No acute event overnight. Neuro stable still has hemianopia around right. Patient not refused loop recorder, agree with  starting a cardiac event monitoring as outpatient with Dr. Algie Coffer. Discussed with Dr. Algie Coffer again, will put the patient back on Eliquis and follow-up as outpatient. Patient can be discharged from neuro standpoint, will continue follow-up with Dr. Pearlean Brownie at [facility] in 4 weeks.  Neurology will sign off. Please call with questions. Pt will follow up with stroke clinic Dr. Pearlean Brownie at [facility] in about 4 weeks. Thanks for the consult.   Marvel Plan, MD PhD Stroke Neurology  03/13/2020 7:56 PM    To contact Stroke Continuity provider, please refer to [email or web address]. After hours, contact General Neurology

## 2020-03-13 NOTE — Progress Notes (Signed)
  Speech Language Pathology Treatment: Cognitive-Linquistic  Patient Details Name: [name] Good MRN: [id number] DOB: 1950/07/24 Today's Date: 03/13/2020 Time: 0912-0930 SLP Time Calculation (min) (ACUTE ONLY): 18 min  Assessment / Plan / Recommendation Clinical Impression  Pt was seen for cognitive-linguistic treatment. She was alert and cooperative during the session. Her cognition was improved compared to that which was demonstrated on 11/14. She was oriented x4 during the session and expressed multiple times that she would like to go home. She was able to recall various activities from the day, the name of her cardiologist, and her plan of care. However, short term memory deficits were still noted during more structured tasks. She exhibited improved awareness of her deficits during this session but impairments continue to be observed in anticipatory awareness. She required cues for sustained attention during cognitive-linguistic tasks, but adequate attention was noted during conversation. She achieved 60% accuracy with time management problems increasing to 100% with repetition and cues. Verbal prompts, additional processing time, and cueing were needed for executive function. SLP will continue to follow pt.    HPI HPI: Pt is a 69 y.o. female with medical history significant of prior MI, cardioembolic stroke in June 2021, and prior lumbar fusion who presented to the ED with c/o AMS and blurry vision R eye. MRI brain: Patchy acute left PCA territory infarcts. Pt does have a history of cognitive-linguistic impairments and received inpatient rehab SLP services for cognition in June. At discharge she scored 29/30 on MoCA 7.2 but demonstrated impairments in complex problem solving with organization and error awareness.       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 Vear Clock, MS, CCC-SLP Acute Rehabilitation Services Office number [phone] Pager [phone]                Scheryl Marten 03/13/2020, 9:38 AM

## 2020-03-13 NOTE — TOC Transition Note (Signed)
Transition of Care [facility]) - CM/SW Discharge Note   Patient Details  Name: [name] MRN: [id number] Date of Birth: April 27, 1951  Transition of Care [facility]) CM/SW Contact:  Kermit Balo, RN Phone Number: 03/13/2020, 10:50 AM   Clinical Narrative:    Pt discharging home today with family and resumption of HH service through [facility]. Amy with [facility] is aware of d/c.  Pt and daughter are working on 24 hour supervision once pt gets home.  Daughter to provide transport home.   Final next level of care: Home w Home Health Services Barriers to Discharge: No Barriers Identified   Patient Goals and CMS Choice   CMS Medicare.gov Compare Post Acute Care list provided to:: Patient Choice offered to / list presented to : Patient  Discharge Placement                       Discharge Plan and Services   Discharge Planning Services: CM Consult Post Acute Care Choice: Home Health                    HH Arranged: PT, OT, Speech Therapy HH Agency: [facility] Date [facility] Agency Contacted: 03/12/20   Representative spoke with at [facility] Agency: Amy  Social Determinants of Health (SDOH) Interventions     Readmission Risk Interventions No flowsheet data found.

## 2020-03-14 ENCOUNTER — Encounter: Payer: Self-pay | Admitting: Physical Medicine and Rehabilitation

## 2020-03-20 ENCOUNTER — Other Ambulatory Visit: Payer: Self-pay | Admitting: *Deleted

## 2020-03-20 NOTE — Patient Outreach (Signed)
[facility]) Care Management  03/20/2020  [name] Good 12/26/1950 [id number]   [facility] outreach for EMMI-stroke- referred on 03/18/20  RED ON EMMI ALERT Day #       1    Date: Friday 03/15/20 1000 Red Alert Reason: scheduled a follow up appointment? I don't know   Insurance:  [employer] care [facility])  [facility] admissions x 3 ED visits x   in the last 6 months   [facility] Unsuccessful outreach   Outreach attempt to the home number  No answer. [facility] RN CM left HIPAA [facility] Portability and Accountability Act) compliant voicemail message along with CM's contact info.   Plan: [facility] RN CM scheduled this patient for another call attempt within 4-7 business days  [name] Good L. Noelle Penner, RN, BSN, CCM [facility] Telephonic Care Management Care Coordinator Office number [phone] Mobile number [phone]  Main [facility] number [phone] Fax number [phone]

## 2020-03-25 ENCOUNTER — Other Ambulatory Visit: Payer: Self-pay | Admitting: *Deleted

## 2020-03-25 NOTE — Patient Outreach (Signed)
[facility]) Care Management  03/25/2020  [name] Good Dec 30, 1950 [id number]   [facility] 2nd outreach for EMMI-stroke- referred on 03/18/20  RED ON EMMI ALERT Day #       1    Date: Friday 03/15/20 1000 Red Alert Reason: scheduled a follow up appointment? I don't know   Insurance:  United [employer] care [facility]  [facility] admissions x 3 ED visits x 3 in the last 6 months   [facility] Unsuccessful outreach #2   Outreach attempt to the home number  No answer. [facility] RN CM was not able to leave a HIPAA [facility] Portability and Accountability Act) compliant voicemail message along with CM's contact info as there was not any answer at [phone]  Plan: [facility] RN CM scheduled this patient for another call attempt within 4-7 business days A [facility] unsuccessful outreach letter was sent   Cala Bradford L. Noelle Penner, RN, BSN, CCM [facility] Telephonic Care Management Care Coordinator Office number [phone] Mobile number [phone]  Main [facility] number [phone] Fax number [phone]

## 2020-03-29 ENCOUNTER — Other Ambulatory Visit: Payer: Self-pay | Admitting: *Deleted

## 2020-03-29 NOTE — Patient Outreach (Signed)
[facility]) Care Management  03/29/2020  [name] Good [birth year] [id number]   [facility] 3rd outreach for EMMI-stroke- referred on 03/18/20 RED ON EMMI ALERT Day #1Date: Friday 03/15/20 1000 Red Alert Reason:scheduled a follow up appointment? I don't know  And 2nd RED ON EMMI ALERT to [facility] RN CM on 03/25/20  Day# 9 Date: Saturday 03/23/20 1000 Red Alert Reason: Sad, hopeless, anxious or empty? Yes Insurance:United [employer] care [facility])  [facility] admissions x3ED visits x 3 in the last 6 months   [facility] Unsuccessful outreach # 3   Outreach attempt to the home number  No answer. [facility] RN CM was not able to leave a HIPAA [facility] Portability and Accountability Act) compliant voicemail message along with CM's contact info as there was not any answer at [phone]  Plan: [facility] RN CM scheduled this patient for another call attempt within 3 weeks A [facility] unsuccessful outreach letter was sent   Bryssa Tones L. Noelle Penner, RN, BSN, CCM [facility] Telephonic Care Management Care Coordinator Office number [phone] Mobile number [phone]  Main [facility] number [phone] Fax number [phone]

## 2020-04-17 ENCOUNTER — Ambulatory Visit: Payer: Medicare Other | Admitting: Adult Health

## 2020-04-18 ENCOUNTER — Encounter: Payer: Self-pay | Admitting: *Deleted

## 2020-04-18 ENCOUNTER — Other Ambulatory Visit: Payer: Self-pay | Admitting: *Deleted

## 2020-04-18 ENCOUNTER — Other Ambulatory Visit: Payer: Self-pay

## 2020-04-18 NOTE — Patient Outreach (Signed)
[facility]) Care Management  04/18/2020  [name] Good [birth year] [id number]  [facility] outreach for EMMI-stroke- referred on 03/18/20 RED ON EMMI ALERT - NOT ON APL  Day #1Date: Friday 03/15/20 1000 Red Alert Reason:scheduled a follow up appointment? I don't know  And 2nd RED ON EMMI ALERT to [facility] RN CM on 03/25/20  Day# 9 Date: Saturday 03/23/20 1000 Red Alert Reason: sad, hopeless, anxious or empty? Yes Insurance:United [employer] care [facility])  [facility] admissions x3ED visits x3 in the last 6 months  Last admission on 03/08/20 to 03/13/20 for cerebral infarction  Outreach today is successful to home number Patient is able to verify HIPAA [facility] Portability and Accountability Act) identifiers Reviewed and addressed the purpose of the follow up call with the patient  Consent: [facility] RN CM reviewed [facility] services with patient. Patient gave verbal consent for services.  EMMI [name] Good confirms she is experiencing feelings/symptoms of depression related to her stroke and the loss of her mother "I am sad. My mom died in may also" Today she states she has a lot of emotions "today is not a good day" She also reports she is "slow at getting ready" and she is scheduled to be seen by her home health therapist at 1200 [facility] RN CM and pt agreed to a follow up call within 7 business days and for [facility] RN CM to order a referral to [facility] for evaluation and treatment related to her symptoms of depression  She was tearful at times during the outreach Support and empathy provided Her medication list does not indicate a prescribed antidepressant  Depression screen [facility] 2/9 04/18/2020  Decreased Interest 1  Down, Depressed, Hopeless 3  PHQ - 2 Score 4  Altered sleeping 1  Tired, decreased energy 1  Change in appetite 0  Feeling bad or failure about yourself  0  Trouble concentrating 1  Moving slowly or fidgety/restless 1  Suicidal  thoughts 0  PHQ-9 Score 8  Difficult doing work/chores Somewhat difficult   Plans  Patient agrees to care plan and follow up [facility] RN CM and pt agreed to a follow up call within 7 business days and for [facility] RN CM to order a referral to [facility] for evaluation and treatment related to her symptoms of depression  Pt encouraged to return a call to [facility] RN CM prn Routed note to MD  Goals      Patient Stated   .  [facility]) Manage My Emotions (pt-stated)      Timeframe:  Short-Term Goal Priority:  High Start Date:     04/18/20                        Expected End Date:     06/24/20                  Follow Up Date  04/24/20 - talk about feelings with a friend, family or spiritual advisor         Notes:          Kathreen Cosier. Noelle Penner, RN, BSN, CCM [facility] Telephonic Care Management Care Coordinator Office number [phone] Main [facility] number [phone] Fax number [phone]

## 2020-04-22 ENCOUNTER — Other Ambulatory Visit: Payer: Self-pay | Admitting: *Deleted

## 2020-04-22 ENCOUNTER — Encounter: Payer: Self-pay | Admitting: *Deleted

## 2020-04-22 NOTE — Patient Outreach (Signed)
[facility]) Care Management  04/22/2020  [name] Good 09-22-1950 [id number]   CSW made an initial attempt to try and contact patient today to perform the initial phone assessment, as well as assess and assist with social work needs and services, without success.  CSW was unable to leave a HIPAA compliant message on voicemail for patient, as patient's phone just continued to ring, with no answer.  CSW will make a second outreach attempt within the next 3-4 business days, if a return call is not received from patient in the meantime.  CSW will also mail a Patient Unsuccessful Outreach Letter to patient's home, requesting that she contact CSW if she is interested in receiving social work services through CSW with [facility], [facility].  [name] Good, BSW, MSW, LCSW  Licensed Restaurant manager, [employer] [facility]  Mailing [place] [address], [place], [place] [address] Physical Address-[address], [place], [place] [address] Toll Free Main # [phone] Fax # [phone] Cell # [phone]  [name]

## 2020-04-23 ENCOUNTER — Ambulatory Visit: Payer: Medicare Other | Admitting: *Deleted

## 2020-04-24 ENCOUNTER — Other Ambulatory Visit: Payer: Self-pay

## 2020-04-24 ENCOUNTER — Other Ambulatory Visit: Payer: Self-pay | Admitting: *Deleted

## 2020-04-24 NOTE — Patient Outreach (Signed)
[facility]) Care Management  22-May-2020  [name] Good 28-Aug-1950 [id number]  [facility] outreach follow up for EMMI referred patient  RED ON EMMI ALERT - NOT ON APL  Day #1Date: Friday 03/15/20 1000 Red Alert Reason:scheduled a follow up appointment? I don't know  And 2nd RED ON EMMI ALERT to [facility] RN CM on 03/25/20  Day# 9 Date: Saturday 03/23/20 1000 Red Alert Reason: sad, hopeless, anxious or empty? Yes Insurance:United Health care [facility])  [facility] admissions x3ED visits x3 in the last 6 months  Last admission on 03/08/20 to 03/13/20 for cerebral infarction  Outreach successful to home number Patient is able to verify HIPAA [facility] Portability and Accountability Act) identifiers Reviewed and addressed the purpose of the follow up call with the patient  Consent: [facility]) RN CM reviewed [facility] services with patient. Patient gave verbal consent for services.   Follow up  Patient is able to verify HIPAA [facility] Portability and Accountability Act) identifiers Reviewed and addressed the purpose of the follow up call with the patient  Consent: [facility] ([facility]) RN CM reviewed [facility] services with patient. Patient gave verbal consent for services.  Improved coping symptoms States she feels better today as she got some time to rest and was able to have a good supportive conversation with one of her daughters. Her daughter wanted pt to know that she was available and although she may be busy she would never be too busy to help or speak with the pt She confirms she has a good holiday even with some disappointing moments She denies medical concerns   Today is [name] deceased mother's birthday 2024/05/23)  She initially started to become tearful with speaking of this but with encouragement and inquires about happy memories of her mother she was laughing and jovial [facility] RN CM encouraged her to apply this  principle of replacing sad memories with happy thoughts/memories of her mother  She will attempt to continue to practice positive talk/speaking/thoughts Allowed her to express good memories  She was able to share during this outreach that her mother was very considerate, a good beautician, a good homemaker mother  Reviewed meditation method to decrease anxiety to include getting focused  [facility] RN CM discussed [facility] outreach to her on 04/22/20  [facility] RN CM attempted to connect [name] and [facility] today as Mrs Wich reports she was available but was not successful. Mrs Tschantz was made aware of [facility] availability to attempt a call to her on 04/29/20  Mrs Mariscal was appreciative and states "It is good to be able to talk to someone about it"  She again confirmed she saw primary care provider (PCP) Dr Clarene Duke on "last week"   Plans Patient agrees to care plan and follow up within the next 10-14 business days  Pt encouraged to return a call to [facility] RN CM prn Goals Addressed              This Visit's Progress     Patient Stated   .  [facility]) Manage My Emotions (pt-stated)   On track     Timeframe:  Short-Term Goal Priority:  High Start Date:     04/18/20                        Expected End Date:     06/24/20  Follow Up Date  04/24/20 - talk about feelings with a friend, family or spiritual advisor - practice positive thinking and self-talk    Notes:  04/24/20 feeling better, good conversation with her daughter, had good holiday Will initiate positive talk/thoughts         Daphney Hopke L. Noelle Penner, RN, BSN, CCM [facility] Telephonic Care Management Care Coordinator Office number [phone] Main [facility] number [phone] Fax number [phone]

## 2020-04-29 ENCOUNTER — Encounter: Payer: Self-pay | Admitting: *Deleted

## 2020-04-29 ENCOUNTER — Other Ambulatory Visit: Payer: Self-pay | Admitting: *Deleted

## 2020-04-29 NOTE — Patient Outreach (Signed)
[facility]) Care Management  04/29/2020  [name] Good Sep 13, 1950 [id number]   CSW was able to make initial contact with patient today to perform phone assessment, as well as assess and assist with social work needs and services.  CSW introduced self, explained role and types of services provided through [facility] Care Management [facility] Care Management).  CSW further explained to patient that CSW works with patient's RNCM, also with [facility] Care Management, Edd Arbour.  CSW then explained the reason for the call, indicating that Mrs. [name] Good thought that patient would benefit from social work services and resources to assist with counseling for symptoms of depression, as well as grief and loss support groups.  CSW obtained two HIPAA compliant identifiers from patient, which included patient's name and date of birth.  Patient admitted that her biggest concern at present is whether or not she will receive full recovery of her eyesight.  Patient reported that it was explained to her that the vision that she has 3 months after her stroke, is the vision that she will have moving forward.  Patient stated that she is still experiencing blurred vision, sensitivity to light, floaters and dizziness, with no improvement to her vision.  Patient went on to explain that she saw an Opthalmologist at the beginning of December (2021) to undergo a complete eye examination and was told that the cataract surgery that she had last January (2021) was healing nicely and had good margins.  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   Patient then talked at length about her 10 year old mother, and how she died of Pneumonia, roughly 7 months ago, in May (2021).  Patient admitted that she is still actively grieving the loss, and holds herself responsible for her mother's death.  Patient stated, "She was miserable at the nursing  facility, calling me everyday, several times a day, begging me to bring her home, so I did, not realizing that with Pneumonia she needed to get up and move around, as opposed to lying flat on her back the entire time.  Patient then became tearful, reporting that she was unable to get help from anyone, basically responsible for providing patient's 24 hour care and supervision, until Civil engineer, contracting [facility]), stepped in.  CSW was able to offer empathy and supportive services to patient today, encouraging her to try and take the blame off of herself for her mother's death, a horrible burden to carry.  Patient denied having ever received counseling services in the past, but admitted to trying several antidepressant medications, before finally giving up on psychotropic medications altogether.  Patient reported that she would be willing to revisit the top with her Primary Care Physician, Dr. Aida Good, and was encouraged to schedule a follow-up appointment.  CSW offered to provide ongoing telephonic counseling services to patient, for which she was somewhat receptive, but needed a little more time to think about it.  CSW also agreed to mail patient the following list of resources:  . [facility] . [facility] . [facility] . 12 Common Symptoms of Depression . Stress Management - Breathing Exercises for Relaxation  . Relaxation Techniques for Depression . Caregiver Support Groups . Loneliness and Isolation  Patient currently lives at home with her husband, [name] Good and is able to perform all activities of daily living independently.  Patient admitted to taking  her prescription medications exactly as prescribed and that Mr. Lempke, or one of her two daughter's, are currently transporting her to and from her physician appointments.  Patient denies having food insecurities and reported already having her Advanced  Directives (Living Will and Healthcare Power of Attorney documents) in place.  Patient was encouraged to provide a copy of her Advanced Directives to Dr. Clarene Good to scan into her electronic medical record in Epic.  Patient was agreeable to having CSW follow-up with her again next week, on Monday, May 06, 2020, around 10:00AM.  Danford Bad, BSW, MSW, [employer]  Licensed Clinical Social Financial planner Health System  Mailing [place] [address] [place], [place] [address] Physical Address-[address] [place], [place] [address] Toll Free Main # [phone] Fax # [phone] Cell # [phone]  [email or web address]

## 2020-05-03 ENCOUNTER — Encounter
Payer: Medicare Other | Attending: Physical Medicine and Rehabilitation | Admitting: Physical Medicine and Rehabilitation

## 2020-05-06 ENCOUNTER — Other Ambulatory Visit: Payer: Self-pay | Admitting: *Deleted

## 2020-05-06 NOTE — Patient Outreach (Signed)
[facility]) Care Management  05/06/2020  [name] Good 1950-08-22 [id number]   Not feeling good down   [facility] unsuccessful outreach for follow up for EMMI stroke referred patient  RED ON EMMI ALERT-NOT ON APL  Day #1Date: Friday 03/15/20 1000 Red Alert Reason:scheduled a follow up appointment? I don't know  And 2nd RED ON EMMI ALERT to [facility] RN CM on 03/25/20  Day# 9 Date: Saturday 03/23/20 1000 Red Alert Reason:sad, hopeless, anxious or empty? Yes Insurance:United Health care [facility])  [facility] admissions x3ED visits x3 in the last 6 months  Last admission on 03/08/20 to 03/13/20 for cerebral infarction  Outreach unsuccessful to home number  Outreach x 1 to [phone] and the phone rang, was picked up and disconnected Outreach again and Mr Kallista Pae answered to state [name] He reports she has not been feeling good related to her stroke [facility] RN CM offered to assist by assessing her and calling Dr Voncille Lo but Mr Pettinato prefers she rests [facility] RN CM left HIPAA [employer] Portability and Accountability Act) compliant voicemail message along with CM's contact info. Ask him to let her know [facility] RN CM was following back up with her  Encouragement and empathy provided   Noted that pt was able to connect with [facility] on 04/29/20 for grieving counseling/resources, an attempt by [facility] to follow up on 05/06/20 unsuccessful Epic indicates she is not to follow up with neurology until 05/23/20  She informed [facility] RN CM she had been seen by primary care provider (PCP) in December 2021 the week prior to 04/24/20  Plans [facility] RN CM will follow up within the next 7-10 business days  Pt encouraged to return a call to [facility] RN CM prn Routed note to MDs Goals Addressed              This Visit's Progress     Patient Stated   .  [facility]) Manage My Emotions (pt-stated)   On track     Timeframe:  Short-Term Goal Priority:   High Start Date:     04/18/20                        Expected End Date:     06/24/20                  Follow Up Date  05/06/20 - talk about feelings with a friend, family or spiritual advisor - practice positive thinking and self-talk     Notes:  05/07/19 Noted that pt was able to connect with [facility] on 04/29/20 for grieving counseling/resources, an attempt by [facility] to follow up on 05/06/20 unsuccessful  04/24/20 feeling better, good conversation with her daughter, had good holiday Will initiate positive talk/thoughts        [name] Biggins L. Noelle Penner, RN, BSN, CCM [facility] Telephonic Care Management Care Coordinator Office number [phone] Main [facility] number [phone] Fax number [phone]

## 2020-05-06 NOTE — Patient Outreach (Signed)
[facility]) Care Management  05/06/2020  [name] Good [birth year] [id number]   CSW made an attempt to try and contact patient today to follow-up regarding social work services and resources, as well as to ensure that she received the packet of resource information and educational materials that CSW mailed to her home last week; however, patient was unavailable at the time of CSW's call.  CSW was unable to leave a HIPAA compliant message on voicemail for patient, as her phone just continued to ring.  CSW will make a second outreach attempt within the next 3-4 business days, if a return call is not received from patient in the meantime.  Danford Bad, BSW, MSW, LCSW  Licensed Restaurant manager, fast food Health System  Mailing [place] [address], [place], [place] [address] Physical Address-[address] [place], [place] [address] Toll Free Main # [phone] Fax # [phone] Cell # [phone]  [email or web address]

## 2020-05-08 ENCOUNTER — Other Ambulatory Visit: Payer: Self-pay | Admitting: *Deleted

## 2020-05-08 ENCOUNTER — Encounter: Payer: Self-pay | Admitting: *Deleted

## 2020-05-08 NOTE — Patient Outreach (Signed)
[facility]) Care Management  05/08/2020  LAIZA VEENSTRA Sep 10, 1950 [id number]   CSW received an incoming call from patient today, in response to the HIPAA compliant message left on voicemail for her by CSW earlier in the week, on Monday, May 06, 2020, around 12:38PM.  Patient admitted to receiving the packet of resource information mailed to her home by CSW several weeks ago, but denied having had an opportunity to review the contents of the packet, which included all of the following information:   [place]  [facility]  [facility]  12 Common Symptoms of Depression  Stress Management - Breathing Exercises for Relaxation   Relaxation Techniques for Depression  Caregiver Support Groups  Loneliness and Isolation  CSW encouraged patient to contact CSW if she has specific questions pertaining to any of the information received, or if she needs help getting established with a therapist/psychiatrist in the community.  CSW was able to confirm that patient has the correct contact information for CSW.  Patient voiced understanding and was agreeable to this plan, continuing to deny the desire to receive counseling and supportive services through [place] with [place].  CSW will perform a case closure on patient, as all goals of treatment have been met from social work standpoint and no additional social work needs have been identified at this time.  CSW will notify patient's RNCM with [place] [facility], Jackelyn Poling of CSW's plans to close patient's case.  CSW will fax an update to patient's Primary Care Physician, Dr. Tamsen Roers to ensure that he is aware of CSW's involvement with patient's plan of care, in addition to routing a Physician Case Closure Letter to his office.    Nat Christen, BSW, MSW, LCSW  Licensed Brewing technologist Health System  Mailing [place] [address] [place], [place] [address] Physical Address-[address] [place],  [address] Toll Free Main # [phone] Fax # [phone] Cell # [phone]  [email or web address]

## 2020-05-10 ENCOUNTER — Ambulatory Visit: Payer: Self-pay | Admitting: *Deleted

## 2020-05-14 ENCOUNTER — Other Ambulatory Visit: Payer: Self-pay

## 2020-05-14 ENCOUNTER — Other Ambulatory Visit: Payer: Self-pay | Admitting: *Deleted

## 2020-05-14 NOTE — Patient Outreach (Signed)
[facility]) Care Management  05/14/2020  [name] Good July 06, 1950 [id number]   [facility] outreach and case closure for follow up for EMMI stroke referred patient  RED ON EMMI ALERT-NOT ON APL  Day #1Date: Friday 03/15/20 1000 Red Alert Reason:scheduled a follow up appointment? I don't know  And 2nd RED ON EMMI ALERT to [facility] RN CM on 03/25/20  Day# 9 Date: Saturday 03/23/20 1000 Red Alert Reason:sad, hopeless, anxious or empty? Yes Insurance:United Health care [facility])  [facility] admissions x3ED visits x3 in the last 6 months  Last admission on 03/08/20 to 03/13/20 for cerebral infarction  Successful outreach after an unsuccessful outreach on 05/06/20 Patient is able to verify HIPAA [facility] Portability and Accountability Act) identifiers Reviewed and addressed the purpose of the follow up call with the patient  Consent: [facility] ([facility]) RN CM reviewed [facility] services with patient. Patient gave verbal consent for services.  But patient reports she is getting ready for a visit from her home health therapist She confirms she had outreach from [facility] and has not been able to follow up with her  She reports various telemarketer calls and requests the number for [facility] She wrote down and repeated [facility] SW number to [facility] RN CM  She agreed to another outreach She denies any further medical or care coordination needs   Plans Patient agrees to case closure and no further follow up at this time Letters sent to patient   Goals Addressed              This Visit's Progress     Patient Stated   .  COMPLETED: [facility]) Manage My Emotions (pt-stated)   On track     Timeframe:  Short-Term Goal Priority:  High Start Date:     04/18/20                        Expected End Date:     05/14/20                Follow Up Date  05/14/20 - talk about feelings with a friend, family or spiritual advisor - practice positive thinking and self-talk      Notes:  05/14/20 issues resolved per patient Case closure  05/07/19 Noted that pt was able to connect with [facility] SW on 04/29/20 for grieving counseling/resources, an attempt by [facility] to follow up on 05/06/20 unsuccessful  04/24/20 feeling better, good conversation with her daughter, had good holiday Will initiate positive talk/thoughts          Kimberly L. Noelle Penner, RN, BSN, CCM [facility] Telephonic Care Management Care Coordinator Office number [phone] Main [facility] number [phone] Fax number [phone]

## 2020-05-23 ENCOUNTER — Other Ambulatory Visit: Payer: Self-pay

## 2020-05-23 ENCOUNTER — Ambulatory Visit: Payer: Medicare Other | Admitting: Neurology

## 2020-05-23 ENCOUNTER — Encounter: Payer: Self-pay | Admitting: Neurology

## 2020-05-23 VITALS — BP 121/65 | HR 75 | Ht 63.0 in | Wt 175.0 lb

## 2020-05-23 DIAGNOSIS — I63332 Cerebral infarction due to thrombosis of left posterior cerebral artery: Secondary | ICD-10-CM | POA: Diagnosis not present

## 2020-05-23 DIAGNOSIS — H53461 Homonymous bilateral field defects, right side: Secondary | ICD-10-CM | POA: Diagnosis not present

## 2020-05-23 NOTE — Progress Notes (Signed)
[facility] [address] [place] [id number] [phone]       HOSPITAL FOLLOW UP NOTE  [name] Date of Birth:  05-16-1950 Medical Record Number:  [id number]   Reason for Referral:  hospital stroke follow up    SUBJECTIVE:   CHIEF COMPLAINT:  Chief Complaint  Patient presents with  . Follow-up    "Hospital FU 2nd stroke, 11/21" Room 8, Daughter Steward DroneBrenda in room    HPI:   [name] is a 70 y.o. female with history of CAD, depression, arthritis, GERD, RLS who presented on 10/01/2019 with L sided weakness.  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  LDL 118 initiate atorvastatin 80 mg daily.  Other stroke risk factors HTN, advanced age, former tobacco use, current EtOH use, morbid obesity and CAD/MI.  Other active problems include severe hypokalemia and dehydration due to poor p.o. intake (recent death of her mother) and RLS on Requip.  Evaluated by therapies and recommended discharge to CIR for ongoing therapy needs.  Stroke:   R thalamic infarct secondary to small vessel disease    Code Stroke CT head No acute abnormality. Small vessel disease. Atrophy. Old R parietal occipital infarct. ASPECTS 10.     CT CS no acute trauma  CTA head & neck no LVO. Proximal R ICA 50% stenosis. Severe distal R ACA short segment stenosis. Moderate atherosclerosis head and neck.   CT perfusion no core infarct, no perfusion deficits  MRI  R thalamic infarct. Old R temporal occipital cortical infarct. Small vessel disease. Atrophy.   2D Echo 0.5 x 0.7 cm mobile filling defect in the LV apex with severe hypokinetic aneurysmal segment consistent with thrombus   LDL 118  HgbA1c 5.7  Lovenox 40 mg sq daily for VTE prophylaxis  Inconsistently  taking aspirin 325 mg daily prior to admission, now on aspirin 325 mg daily. 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  Discharged home from CIR on 10/21/2019 after uncomplicated 16-day stay.  Residual deficits of right-sided weakness and paresthesias.  Continues to work with home therapy with ongoing improvement.  Use of rolling walker or cane for short distance and wheelchair for long distance.  Ambulation also limited due to chronic pain of lower back and knees as well as chronic neuropathy.  She is slowly returning to independently completing ADLs but continues to need assistance with IADLs from her daughter.  Reports bilateral lower extremity L>R with improvement after cardiologist initiated furosemide.  Continues on gabapentin 400 mg 3 times daily managed by PMR  Remains on Eliquis for LV thrombus with 3 reported mild nosebleeds lasting for less than 1 minute.  She denies excessive amount of blood or difficulty stopping amiodarone.  Denies any other bleeding issues or bruising.  Plans on following cardiology with repeat echo 3 months from discharge.  Continues on atorvastatin without myalgias.  Blood pressure today 127/73.  Continues to follow with PCP regularly for HTN and HLD management.  Update 05/23/2020: She returns for follow-up after last visit with [name] nurse practitioner in July 2021.  Patient was admitted  with another stroke on 03/08/2020 to [facility].  She presented with confusion altered mental status and blurred vision and loss of peripheral vision on the right side.  The night prior to admission she had an argument with her husband and headache and went to sleep. Morning the headache was resolved but her  vision symptoms and confusion persisted.  CT scan done in the emergency room showed [place] acute to subacute left occipital cortical infarct which was new.  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  MRI scan showed patchy acute left posterior cerebral artery territory infarcts and severe changes of chronic small vessel disease and multiple old previous strokes.  Transthoracic echo showed ejection fraction of 60 to 65% and the previously seen left ventricular thrombus is no longer seen.  LDL cholesterol was 92 mg percent and hemoglobin A1c was 5.4.  Urine drug screen was negative.  Discussion was done to stop Eliquis and change to aspirin since LV clot was not resolved but patient's cardiologist Dr. Algie Coffer felt she needed to stay on it longer hence it was continued.  Patient states she done well since discharge but she still has some right-sided peripheral vision loss and blurred vision.  She is getting home physical and occupational therapy.  She was able to walk with a cane but does not walk for too long.  She still has trouble reading.  She complains of some intermittent altered temperature and sensation on the right forehead and wonders if this is related to a stroke.  She has an appointment coming with her rheumatologist soon to discuss her's swelling of her joints and pain.   ROS:   14 system review of systems performed and negative with exception of vision difficulties, peripheral vision loss, gait difficulty, swelling, weakness, pain, numbness/tingling and gait impairment  PMH:  Past Medical History:  Diagnosis Date  . Anxiety   . Arthritis   . Depression   . GERD (gastroesophageal reflux disease)   . Myocardial infarction (HCC) 89   no visits to cardiac dr(thomas kelly) since 89  . Stroke (HCC)   .  Wound dehiscence    lumbar    PSH:  Past Surgical History:  Procedure Laterality Date  . APPENDECTOMY     17 yrs  . BACK SURGERY    . BREAST SURGERY Left    cyst  . BUBBLE STUDY  03/12/2020   Procedure: BUBBLE STUDY;  Surgeon: Orpah Cobb, MD;  Location: [facility] ENDOSCOPY;  Service: Cardiovascular;;  . CARDIAC CATHETERIZATION  89  . CHOLECYSTECTOMY     70 yrs old  . LUMBAR LAMINECTOMY/DECOMPRESSION MICRODISCECTOMY N/A 06/14/2015   Procedure: Lumbar Three-Four,Lumbar Four-Five, Lumbar Five-Sacral One Laminectomy;  Surgeon: Tia Alert, MD;  Location: [facility] NEURO ORS;  Service: Neurosurgery;  Laterality: N/A;  . LUMBAR WOUND DEBRIDEMENT N/A 07/24/2015   Procedure: lumbar wound revision;  Surgeon: Tia Alert, MD;  Location: [facility] NEURO ORS;  Service: Neurosurgery;  Laterality: N/A;  . TEE WITHOUT CARDIOVERSION N/A 03/12/2020   Procedure: TRANSESOPHAGEAL ECHOCARDIOGRAM (TEE);  Surgeon: Orpah Cobb, MD;  Location: [facility] ENDOSCOPY;  Service: Cardiovascular;  Laterality: N/A;  . TOTAL HIP ARTHROPLASTY Right 10/25/2015   Procedure: RIGHT TOTAL HIP ARTHROPLASTY ANTERIOR APPROACH;  Surgeon: Kathryne Hitch, MD;  Location: [facility] ORS;  Service: Orthopedics;  Laterality: Right;  . TUBAL  LIGATION      Social History:  Social History   Socioeconomic History  . Marital status: Married    Spouse name: Vasilia Dise  . Number of children: 4  . Years of education: GED  . Highest education level: GED or equivalent  Occupational History  . Occupation: retired   Tobacco Use  . Smoking status: Former Smoker    Packs/day: 2.00    Years: 40.00    Pack years: 80.00    Types: Cigarettes    Quit date: 04/10/2013    Years since quitting: 7.1  . Smokeless tobacco: Never Used  Vaping Use  . Vaping Use: Former  . Start date: 04/11/2013  . Substances: Nicotine, Nicotine-salt  Substance and Sexual Activity  . Alcohol use: Not Currently    Comment: rarely  . Drug use: No  . Sexual activity: Not  Currently  Other Topics Concern  . Not on file  Social History Narrative   Lives with husband   Right Handed   Drinks1-2 cups caffeine daily   Social Determinants of Health   Financial Resource Strain: Low Risk   . Difficulty of Paying Living Expenses: Not hard at all  Food Insecurity: No Food Insecurity  . Worried About Programme researcher, broadcasting/film/video in the Last Year: Never true  . Ran Out of Food in the Last Year: Never true  Transportation Needs: No Transportation Needs  . Lack of Transportation (Medical): No  . Lack of Transportation (Non-Medical): No  Physical Activity: Inactive  . Days of Exercise per Week: 0 days  . Minutes of Exercise per Session: 0 min  Stress: Stress Concern Present  . Feeling of Stress : To some extent  Social Connections: Moderately Integrated  . Frequency of Communication with Friends and Family: More than three times a week  . Frequency of Social Gatherings with Friends and Family: More than three times a week  . Attends Religious Services: More than 4 times per year  . Active Member of Clubs or Organizations: No  . Attends Banker Meetings: Never  . Marital Status: Married  Catering manager Violence: Not At Risk  . Fear of Current or Ex-Partner: No  . Emotionally Abused: No  . Physically Abused: No  . Sexually Abused: No    Family History:  Family History  Problem Relation Age of Onset  . Stroke Mother   . Clotting disorder Neg Hx     Medications:   Current Outpatient Medications on File Prior to Visit  Medication Sig Dispense Refill  . atorvastatin (LIPITOR) 80 MG tablet Take 1 tablet (80 mg total) by mouth daily. 30 tablet 0  . famotidine (PEPCID) 20 MG tablet Take 20 mg by mouth daily as needed for heartburn or indigestion.    Marland Kitchen oxyCODONE-acetaminophen (PERCOCET) 10-325 MG tablet SMARTSIG:1 Tablet(s) By Mouth 4-5 Times Daily    . oxyCODONE-acetaminophen (PERCOCET/ROXICET) 5-325 MG tablet Take 1 tablet by mouth every 6 (six) hours  as needed for moderate pain or severe pain. 20 tablet 0  . predniSONE (DELTASONE) 5 MG tablet Take by mouth.    Marland Kitchen rOPINIRole (REQUIP) 3 MG tablet Take 1 tablet (3 mg total) by mouth at bedtime as needed (restless leg). 30 tablet 0  . apixaban (ELIQUIS) 5 MG TABS tablet Take 1 tablet (5 mg total) by mouth 2 (two) times daily. 120 tablet 0  . azithromycin (ZITHROMAX) 250 MG tablet Take by mouth.    . calcium carbonate (TUMS EX) 750 MG chewable tablet  Chew 1 tablet by mouth 2 (two) times daily as needed for heartburn.    . fluticasone (FLONASE) 50 MCG/ACT nasal spray Place into both nostrils.    . magnesium oxide (MAG-OX) 400 MG tablet Take 1 tablet (400 mg total) by mouth daily. 30 tablet 0  . methylPREDNISolone (MEDROL DOSEPAK) 4 MG TBPK tablet Take by mouth.    Marland Kitchen omeprazole (PRILOSEC) 20 MG capsule Take 20 mg by mouth daily.    Marland Kitchen tetrahydrozoline 0.05 % ophthalmic solution Place 1 drop into both eyes daily as needed (Dry eyes).     No current facility-administered medications on file prior to visit.    Allergies:   Allergies  Allergen Reactions  . Amoxicillin Other (See Comments)    Tolerated Zosyn Has patient had a PCN reaction causing immediate rash, facial/tongue/throat swelling, SOB or lightheadedness with hypotension: No Has patient had a PCN reaction causing severe rash involving mucus membranes or skin necrosis: No Has patient had a PCN reaction that required hospitalization No Has patient had a PCN reaction occurring within the last 10 years: No If all of the above answers are "NO", then may proceed with Cephalosporin use.      OBJECTIVE:  Physical Exam  Vitals:   05/23/20 1051  BP: 121/65  Pulse: 75  Weight: 175 lb (79.4 kg)  Height: 5\' 3"  (1.6 m)   Body mass index is 31 kg/m. No exam data present  General: well developed, well nourished,  pleasant elderly Caucasian female, seated, in no evident distress Head: head normocephalic and atraumatic.   Neck: supple  with no carotid or supraclavicular bruits Cardiovascular: regular rate and rhythm, no murmurs; BLE edema Musculoskeletal: no deformity Skin:  no rash/petichiae Vascular:  Normal pulses all extremities   Neurologic Exam Mental Status: Awake and fully alert.  Fluent speech and language. Oriented to place and time. Recent and remote memory intact. Attention span, concentration and fund of knowledge appropriate. Mood and affect appropriate.  Cranial Nerves: Fundoscopic exam reveals sharp disc margins. Pupils equal, briskly reactive to light. Extraocular movements full without nystagmus. Visual fields full   confrontation. Hearing intact. Facial sensation intact. Face, tongue, palate moves normally and symmetrically.  Motor: Normal bulk and tone. Normal strength in all tested extremity muscles except slightly decreased left hand dexterity and left hip flexor weakness. Sensory.: intact to touch , pinprick , position and vibratory sensation.  Coordination: Rapid alternating movements normal in all extremities except slightly decreased left hand. Finger-to-nose performed accurately bilaterally and heel-to-shin difficulty performing due to knee and lower back pain. Gait and Station: Gets out of the chair with some difficulty.  Uses a 4 pronged cane walks with slight dragging of the right leg.  Unable to walk tandem. Reflexes: 1+ and symmetric. Toes downgoing.     NIHSS  1 Modified Rankin  3     ASSESSMENT: [name] Good is a 69 y.o. year old female presented with left-sided weakness on 10/01/2019 with stroke work-up revealing right thalamic infarct secondary to small vessel disease.  Echo showed LV thrombus and initiated Eliquis.  Vascular risk factors include HTN, HLD, CAD/MI, former tobacco use, EtOH use and morbid obesity.      PLAN:  I had a long d/w patient and her daughter about her recent embolic stroke and peripheral vision loss,stroke, risk for recurrent stroke/TIAs, personally  independently reviewed imaging studies and stroke evaluation results and answered questions.Continue Eliquis 5 mg twice daily given history of left ventricular thrombus but consider changing to aspirin  after discussion with Dr. Algie Coffer for secondary stroke prevention and maintain strict control of hypertension with blood pressure goal below 130/90, diabetes with hemoglobin A1c goal below 6.5% and lipids with LDL cholesterol goal below 70 mg/dL. I also advised the patient to eat a healthy diet with plenty of whole grains, cereals, fruits and vegetables, exercise regularly and maintain ideal body weight .she was advised not to drive till her peripheral vision improves and to use a cane at all times and fall and safety precautions.  Continue home physical and occupational therapy followup in the future with my nurse practitioner Shanda Bumps in 6 months or call earlier if necessary. I this was a prolonged office follow-up visit and I spent 45 minutes of face-to-face and non-face-to-face time with patient and daughter.  This included previsit chart review, lab review, study review, order entry, electronic health record documentation, patient education regarding recent stroke, residual deficits, importance of managing stroke risk factors and answered all questions to patient satisfaction     Delia Heady, MD  [facility] [address] [place], [place] [address]  Phone [phone] Fax [phone] Note: This document was prepared with digital dictation and possible smart phrase technology. Any transcriptional errors that result from this process are unintentional.

## 2020-05-23 NOTE — Patient Instructions (Signed)
I had a long d/w patient and her daughter about her recent embolic stroke and peripheral vision loss,stroke, risk for recurrent stroke/TIAs, personally independently reviewed imaging studies and stroke evaluation results and answered questions.Continue Eliquis 5 mg twice daily given history of left ventricular thrombus but consider changing to aspirin after discussion with Dr. Algie Coffer for secondary stroke prevention and maintain strict control of hypertension with blood pressure goal below 130/90, diabetes with hemoglobin A1c goal below 6.5% and lipids with LDL cholesterol goal below 70 mg/dL. I also advised the patient to eat a healthy diet with plenty of whole grains, cereals, fruits and vegetables, exercise regularly and maintain ideal body weight .she was advised not to drive till her peripheral vision improves and to use a cane at all times and fall and safety precautions.  Continue home physical and occupational therapy followup in the future with my nurse practitioner Shanda Bumps in 6 months or call earlier if necessary.  Fall Prevention in the Home, Adult Falls can cause injuries and can happen to people of all ages. There are many things you can do to make your home safe and to help prevent falls. Ask for help when making these changes. What actions can I take to prevent falls? General Instructions  Use good lighting in all rooms. Replace any light bulbs that burn out.  Turn on the lights in dark areas. Use night-lights.  Keep items that you use often in easy-to-reach places. Lower the shelves around your home if needed.  Set up your furniture so you have a clear path. Avoid moving your furniture around.  Do not have throw rugs or other things on the floor that can make you trip.  Avoid walking on wet floors.  If any of your floors are uneven, fix them.  Add color or contrast paint or tape to clearly mark and help you see: ? Grab bars or handrails. ? First and last steps of  staircases. ? Where the edge of each step is.  If you use a stepladder: ? Make sure that it is fully opened. Do not climb a closed stepladder. ? Make sure the sides of the stepladder are locked in place. ? Ask someone to hold the stepladder while you use it.  Know where your pets are when moving through your home. What can I do in the bathroom?  Keep the floor dry. Clean up any water on the floor right away.  Remove soap buildup in the tub or shower.  Use nonskid mats or decals on the floor of the tub or shower.  Attach bath mats securely with double-sided, nonslip rug tape.  If you need to sit down in the shower, use a plastic, nonslip stool.  Install grab bars by the toilet and in the tub and shower. Do not use towel bars as grab bars.      What can I do in the bedroom?  Make sure that you have a light by your bed that is easy to reach.  Do not use any sheets or blankets for your bed that hang to the floor.  Have a firm chair with side arms that you can use for support when you get dressed. What can I do in the kitchen?  Clean up any spills right away.  If you need to reach something above you, use a step stool with a grab bar.  Keep electrical cords out of the way.  Do not use floor polish or wax that makes floors slippery. What can  I do with my stairs?  Do not leave any items on the stairs.  Make sure that you have a light switch at the top and the bottom of the stairs.  Make sure that there are handrails on both sides of the stairs. Fix handrails that are broken or loose.  Install nonslip stair treads on all your stairs.  Avoid having throw rugs at the top or bottom of the stairs.  Choose a carpet that does not hide the edge of the steps on the stairs.  Check carpeting to make sure that it is firmly attached to the stairs. Fix carpet that is loose or worn. What can I do on the outside of my home?  Use bright outdoor lighting.  Fix the edges of walkways  and driveways and fix any cracks.  Remove anything that might make you trip as you walk through a door, such as a raised step or threshold.  Trim any bushes or trees on paths to your home.  Check to see if handrails are loose or broken and that both sides of all steps have handrails.  Install guardrails along the edges of any raised decks and porches.  Clear paths of anything that can make you trip, such as tools or rocks.  Have leaves, snow, or ice cleared regularly.  Use sand or salt on paths during winter.  Clean up any spills in your garage right away. This includes grease or oil spills. What other actions can I take?  Wear shoes that: ? Have a low heel. Do not wear high heels. ? Have rubber bottoms. ? Feel good on your feet and fit well. ? Are closed at the toe. Do not wear open-toe sandals.  Use tools that help you move around if needed. These include: ? Canes. ? Walkers. ? Scooters. ? Crutches.  Review your medicines with your doctor. Some medicines can make you feel dizzy. This can increase your chance of falling. Ask your doctor what else you can do to help prevent falls. Where to find more information  Centers for Disease Control and Prevention, STEADI: FootballExhibition.com.br  [employer] on Aging: https://walker.com/ Contact a doctor if:  You are afraid of falling at home.  You feel weak, drowsy, or dizzy at home.  You fall at home. Summary  There are many simple things that you can do to make your home safe and to help prevent falls.  Ways to make your home safe include removing things that can make you trip and installing grab bars in the bathroom.  Ask for help when making these changes in your home. This information is not intended to replace advice given to you by your health care provider. Make sure you discuss any questions you have with your health care provider. Document Revised: 11/15/2019 Document Reviewed: 11/15/2019 Elsevier Patient Education   2021 [employer].

## 2020-10-16 ENCOUNTER — Ambulatory Visit: Payer: Medicare Other | Admitting: Orthopaedic Surgery

## 2020-10-30 ENCOUNTER — Ambulatory Visit: Payer: Medicare Other | Admitting: Orthopaedic Surgery

## 2020-10-30 ENCOUNTER — Ambulatory Visit: Payer: Self-pay

## 2020-10-30 ENCOUNTER — Encounter: Payer: Self-pay | Admitting: Orthopaedic Surgery

## 2020-10-30 DIAGNOSIS — M25511 Pain in right shoulder: Secondary | ICD-10-CM

## 2020-10-30 DIAGNOSIS — Z96641 Presence of right artificial hip joint: Secondary | ICD-10-CM

## 2020-10-30 DIAGNOSIS — M25512 Pain in left shoulder: Secondary | ICD-10-CM

## 2020-10-30 MED ORDER — PREDNISONE 50 MG PO TABS: ORAL_TABLET | ORAL | 0 refills | Status: DC

## 2020-10-30 MED ORDER — METHOCARBAMOL 500 MG PO TABS: 500.0000 mg | ORAL_TABLET | Freq: Four times a day (QID) | ORAL | 1 refills | Status: DC | PRN

## 2020-10-30 NOTE — Progress Notes (Signed)
Office Visit Note   Patient: [name] Good           Date of Birth: [birth year]           MRN: [id number] Visit Date: 10/30/2020              Requested by: Aida Puffer, MD [address]  [place] [address] PCP: Aida Puffer, MD   Assessment & Plan: Visit Diagnoses:  1. History of right hip replacement   2. Acute pain of left shoulder   3. Acute pain of right shoulder     Plan: Given the severity of her pain in 3 areas, I would like to put her on prednisone 50 mg daily for 5 days as well as 500 mg Robaxin.  I would not advocate any other stronger pain medicine until she has her dizziness worked up.  I would like to see her back in just 3 weeks to see how she is doing overall but no x-rays are needed.  We can certainly consider injections at that visit if needed.  Follow-Up Instructions: Return in about 3 weeks (around 11/20/2020).   Orders:  Orders Placed This Encounter  Procedures   XR HIP UNILAT W OR W/O PELVIS 1V RIGHT   XR Shoulder Left   XR Shoulder Right   Meds ordered this encounter  Medications   predniSONE (DELTASONE) 50 MG tablet    Sig: Take once daily for 5 days    Dispense:  5 tablet    Refill:  0   methocarbamol (ROBAXIN) 500 MG tablet    Sig: Take 1 tablet (500 mg total) by mouth every 6 (six) hours as needed.    Dispense:  40 tablet    Refill:  1      Procedures: No procedures performed   Clinical Data: No additional findings.   Subjective: Chief Complaint  Patient presents with   Right Hip - Pain   Right Shoulder - Pain   Left Shoulder - Pain  The patient is well-known to me.  We replaced her right hip a few years ago.  She does report that she has had 2 recent strokes.  She ambulates with a rolling walker.  She has had a fall recently.  She is off Eliquis and just on aspirin.  This is a result of her stroke in terms of treatment.  She fell around 20 June.  She says recently she has had some dizziness but has not told this to her primary  care physician and I recommended she see her PCP or neurologist due to the dizziness.  She is not a diabetic.  She reports lateral right hip pain and bilateral shoulder pain.  She has had some chronic pain in the right shoulder for some time and was told she had a rotator cuff tear at one time that had healed.  She does have problems with the right shoulder in terms of lifting overhead and behind and her actives daily living.  Both shoulders are still very painful to her since she fell and her right hip hurts on the lateral aspect.  She denies any groin pain.  HPI  Review of Systems She currently denies any headache, chest pain, shortness of breath, fever, chills, nausea, vomiting.  She does report occasional dizziness.  Objective: Vital Signs: There were no vitals taken for this visit.  Physical Exam She is alert and oriented and in no acute distress Ortho Exam Her right operative hip moves  smoothly and fluidly.  There is pain over the trochanteric area to palpation.  Both shoulders are able to abduct and reach above her head that are very painful to do so in the right shoulder is much more painful than the left.  There is glenohumeral grinding and obvious arthritis on the right shoulder.  There is rotator cuff weakness in both shoulders. Specialty Comments:  No specialty comments available.  Imaging: XR HIP UNILAT W OR W/O PELVIS 1V RIGHT  Result Date: 10/30/2020 An AP pelvis lateral the right hip shows a well-seated total hip arthroplasty with no acute findings.  XR Shoulder Left  Result Date: 10/30/2020 3 views of the left shoulder show no acute findings.  The shoulder is well located.  XR Shoulder Right  Result Date: 10/30/2020 3 views of the right shoulder show significant glenohumeral arthritis.  There is otherwise no acute findings.    PMFS History: Patient Active Problem List   Diagnosis Date Noted   Acute embolic stroke (HCC) 03/08/2020   Left ventricular apical thrombus  03/08/2020   Acute lower UTI 03/08/2020   Right thalamic infarction (HCC) 10/05/2019   Severe dehydration 10/02/2019   Grief reaction 10/02/2019   RLS (restless legs syndrome) 10/02/2019   Acute CVA (cerebrovascular accident) (HCC) 10/01/2019   Hypokalemia 10/01/2019   Cellulitis of left upper extremity 10/18/2017   Arthritis 10/18/2017   Septic shock (HCC) 10/16/2017   Former smoker 11/13/2016   History of restless legs syndrome 11/13/2016   Nontraumatic incomplete tear of right rotator cuff 08/18/2016   Autoimmune disease (HCC) 08/12/2016   Primary osteoarthritis of both knees 08/12/2016   Primary osteoarthritis of both feet 08/12/2016   Obesity (BMI 35.0-39.9 without comorbidity) 08/12/2016   Osteoarthritis of right hip 10/25/2015   Status post total replacement of right hip 10/25/2015   S/P lumbar laminectomy 06/14/2015   Past Medical History:  Diagnosis Date   Anxiety    Arthritis    Depression    GERD (gastroesophageal reflux disease)    Myocardial infarction (HCC) 89   no visits to cardiac dr(thomas kelly) since 89   Stroke (HCC)    Wound dehiscence    lumbar    Family History  Problem Relation Age of Onset   Stroke Mother    Clotting disorder Neg Hx     Past Surgical History:  Procedure Laterality Date   APPENDECTOMY     17 yrs   BACK SURGERY     BREAST SURGERY Left    cyst   BUBBLE STUDY  03/12/2020   Procedure: BUBBLE STUDY;  Surgeon: Orpah Cobb, MD;  Location: [facility];  Service: Cardiovascular;;   CARDIAC CATHETERIZATION  84   CHOLECYSTECTOMY     70 yrs old   LUMBAR LAMINECTOMY/DECOMPRESSION MICRODISCECTOMY N/A 06/14/2015   Procedure: Lumbar Three-Four,Lumbar Four-Five, Lumbar Five-Sacral One Laminectomy;  Surgeon: Tia Alert, MD;  Location: [facility];  Service: Neurosurgery;  Laterality: N/A;   LUMBAR WOUND DEBRIDEMENT N/A 07/24/2015   Procedure: lumbar wound revision;  Surgeon: Tia Alert, MD;  Location: [facility];  Service:  Neurosurgery;  Laterality: N/A;   TEE WITHOUT CARDIOVERSION N/A 03/12/2020   Procedure: TRANSESOPHAGEAL ECHOCARDIOGRAM (TEE);  Surgeon: Orpah Cobb, MD;  Location[facility];  Service: Cardiovascular;  Laterality: N/A;   TOTAL HIP ARTHROPLASTY Right 10/25/2015   Procedure: RIGHT TOTAL HIP ARTHROPLASTY ANTERIOR APPROACH;  Surgeon: Kathryne Hitch, MD;  Location: [facility];  Service: Orthopedics;  Laterality: Right;   TUBAL LIGATION  Social History   Occupational History   Occupation: retired   Tobacco Use   Smoking status: Former    Packs/day: 2.00    Years: 40.00    Pack years: 80.00    Types: Cigarettes    Quit date: 04/10/2013    Years since quitting: 7.5   Smokeless tobacco: Never  Vaping Use   Vaping Use: Former   Start date: 04/11/2013   Substances: Nicotine, Nicotine-salt  Substance and Sexual Activity   Alcohol use: Not Currently    Comment: rarely   Drug use: No   Sexual activity: Not Currently

## 2020-10-31 ENCOUNTER — Telehealth: Payer: Self-pay

## 2020-10-31 DIAGNOSIS — R42 Dizziness and giddiness: Secondary | ICD-10-CM

## 2020-10-31 NOTE — Addendum Note (Signed)
Addended by: Barbette Or on: 10/31/2020 01:23 PM   Modules accepted: Orders

## 2020-10-31 NOTE — Telephone Encounter (Signed)
Referral in chart. Pt would like this done asap

## 2020-10-31 NOTE — Telephone Encounter (Signed)
Order sent as urgent to [facility]

## 2020-10-31 NOTE — Telephone Encounter (Signed)
Please advise 

## 2020-10-31 NOTE — Telephone Encounter (Signed)
Patient called she stated she was supposed to be referred to [facility] -per blackman is requesting a referral to be sent call back:[phone]

## 2020-11-04 ENCOUNTER — Ambulatory Visit: Payer: Medicare Other | Admitting: Adult Health

## 2020-11-05 ENCOUNTER — Encounter: Payer: Self-pay | Admitting: Neurology

## 2020-11-05 ENCOUNTER — Ambulatory Visit: Payer: Medicare Other | Admitting: Neurology

## 2020-11-05 VITALS — Ht 63.0 in | Wt 189.0 lb

## 2020-11-05 DIAGNOSIS — R270 Ataxia, unspecified: Secondary | ICD-10-CM | POA: Diagnosis not present

## 2020-11-05 DIAGNOSIS — R42 Dizziness and giddiness: Secondary | ICD-10-CM

## 2020-11-05 NOTE — Patient Instructions (Signed)
I had a long discussion with the patient and her daughter regarding her new complaints of dizziness and ataxia upon standing which may be multifactorial due to combination of recent fall and shoulder injury and pain as well as previous stroke and possibly degenerative cervical spine disease and vestibular dysfunction.  I recommend referral to home physical and occupational therapy for gait and balance training as well as dizziness exercises.  Check MRI scan of the brain for stroke and cervical spine for compressive etiology.  Continue aspirin for stroke prevention and maintain aggressive risk factor modification with strict control of hypertension with blood pressure goal below 130/90, lipids with LDL cholesterol goal below 70 mg percent diabetes with hemoglobin A1c goal below 6.5%.  She will return for follow-up in the future in 3 months or call earlier if necessary.

## 2020-11-05 NOTE — Progress Notes (Signed)
[facility] [address] [place]. [place] [address] [phone]       HOSPITAL FOLLOW UP NOTE  [name] Good Date of Birth:  1950-09-03 Medical Record Number:  [id number]   Reason for Referral:  hospital stroke follow up    SUBJECTIVE:   CHIEF COMPLAINT:  Chief Complaint  Patient presents with   Consult    Room 13 w/ daughter, Latoi. Hx of CVA. Referred for dizziness that started after having a fall on June 20th. She tripped over a tree branch and fell on the right side of her body. Denies hitting her head. She is also having right-sided neck pain.     HPI:   [name] is a 70 y.o. female with history of CAD, depression, arthritis, GERD, RLS who presented on 10/01/2019 with L sided weakness.  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  LDL 118 initiate atorvastatin 80 mg daily.  Other stroke risk factors HTN, advanced age, former tobacco use, current EtOH use, morbid obesity and CAD/MI.  Other active problems include severe hypokalemia and dehydration due to poor p.o. intake (recent death of her mother) and RLS on Requip.  Evaluated by therapies and recommended discharge to CIR for ongoing therapy needs.  Stroke:   R thalamic infarct secondary to small vessel disease   Code Stroke CT head No acute abnormality. Small vessel disease. Atrophy. Old R parietal occipital infarct. ASPECTS 10.    CT CS no acute trauma CTA head & neck no LVO. Proximal R ICA 50% stenosis. Severe distal R ACA short segment stenosis. Moderate atherosclerosis head and neck.  CT perfusion no core infarct, no perfusion deficits MRI  R thalamic infarct. Old R temporal occipital cortical infarct. Small vessel disease. Atrophy.  2D Echo 0.5 x 0.7 cm mobile filling defect in the  LV apex with severe hypokinetic aneurysmal segment consistent with thrombus  LDL 118 HgbA1c 5.7 Lovenox 40 mg sq daily for VTE prophylaxis Inconsistently taking aspirin 325 mg daily prior to admission, now on aspirin 325 mg daily. 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 is being seen for hospital follow-up accompanied by her daughter.  Discharged home from CIR on 10/21/2019 after uncomplicated 16-day stay.  Residual deficits of right-sided weakness and paresthesias.  Continues to work with home therapy with ongoing improvement.  Use of rolling walker or cane for short distance and wheelchair for long distance.  Ambulation also limited due to chronic pain of lower back and knees as well as chronic neuropathy.  She is slowly returning to independently completing ADLs but continues to need assistance with IADLs from her daughter.  Reports bilateral lower extremity L>R with improvement after cardiologist initiated furosemide.  Continues on gabapentin 400 mg 3 times daily managed by PMR  Remains on Eliquis for LV thrombus with 3 reported mild nosebleeds lasting for less than 1 minute.  She denies excessive amount of blood or difficulty stopping amiodarone.  Denies any other bleeding issues or bruising.  Plans on following cardiology with repeat echo 3 months from discharge.  Continues on atorvastatin without myalgias.  Blood pressure today 127/73.  Continues to follow with PCP regularly for HTN  and HLD management.  Update 05/23/2020: She returns for follow-up after last visit with Shanda Bumps nurse practitioner in July 2021.  Patient was admitted with another stroke on 03/08/2020 to [facility].  She presented with confusion altered mental status and blurred vision and loss of peripheral vision on the  right side.  The night prior to admission she had an argument with her husband and headache and went to sleep. Morning the headache was resolved but her vision symptoms and confusion persisted.  CT scan done in the emergency room showed [place] acute to subacute left occipital cortical infarct which was new.  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  MRI scan showed patchy acute left posterior cerebral artery territory infarcts and severe changes of chronic small vessel disease and multiple old previous strokes.  Transthoracic echo showed ejection fraction of 60 to 65% and the previously seen left ventricular thrombus is no longer seen.  LDL cholesterol was 92 mg percent and hemoglobin A1c was 5.4.  Urine drug screen was negative.  Discussion was done to stop Eliquis and change to aspirin since LV clot was not resolved but patient's cardiologist Dr. Algie Coffer felt she needed to stay on it longer hence it was continued.  Patient states she done well since discharge but she still has some right-sided peripheral vision loss and blurred vision.  She is getting home physical and occupational therapy.  She was able to walk with a cane but does not walk for too long.  She still has trouble reading.  She complains of some intermittent altered temperature and sensation on the right forehead and wonders if this is related to a stroke.  She has an appointment coming with her rheumatologist soon to discuss her's swelling of her joints and pain.  Update 11/05/2020 : She returns for follow-up after last visit 6 months ago.  She is accompanied by her daughter.  Patient had a fall on 10/14/2020 when she hurt her shoulder.  She denies hitting her head or losing consciousness.  However since then she has been complaining of dizziness and  transient vertigo and off-balance when she first stands up.  She has to hold on and has not had any falls or injuries.  She also feels her balance is off.  Patient denies any ringing in the ears or decreased hearing.  She denies significant neck pain or radicular pain.  She has switched from Eliquis to aspirin 81 mg daily which is tolerating well with minor bruising and no bleeding.  She has had no recurrent stroke or TIA symptoms.  She had orthostatic vital signs done today in the lying down position pulse rate was 86 and blood pressure 170/45, in the sitting position pulse rate was 82 and blood pressure 170/98.  Upon standing pulse rate remained 83 and blood pressure 181/112 and did not drop significantly to suggest any orthostasis.  ROS:   14 system review of systems performed and negative with exception of shoulder pain, gait imbalance, peripheral vision loss, gait difficulty, swelling, weakness, pain, numbness/tingling and gait impairment  PMH:  Past Medical History:  Diagnosis Date   Anxiety    Arthritis    Depression    GERD (gastroesophageal reflux disease)    Myocardial infarction (HCC) 89   no visits to cardiac dr(thomas kelly) since 89  Stroke [facility])    Wound dehiscence    lumbar    PSH:  Past Surgical History:  Procedure Laterality Date   APPENDECTOMY     17 yrs   BACK SURGERY     BREAST SURGERY Left    cyst   BUBBLE STUDY  03/12/2020   Procedure: BUBBLE STUDY;  Surgeon: Orpah Cobb, MD;  Location: [facility] ENDOSCOPY;  Service: Cardiovascular;;   CARDIAC CATHETERIZATION  60   CHOLECYSTECTOMY     70 yrs old   LUMBAR LAMINECTOMY/DECOMPRESSION MICRODISCECTOMY N/A 06/14/2015   Procedure: Lumbar Three-Four,Lumbar Four-Five, Lumbar Five-Sacral One Laminectomy;  Surgeon: Tia Alert, MD;  Location: [facility] NEURO ORS;  Service: Neurosurgery;  Laterality: N/A;   LUMBAR WOUND DEBRIDEMENT N/A 07/24/2015   Procedure: lumbar wound revision;  Surgeon: Tia Alert, MD;  Location: [facility] NEURO  ORS;  Service: Neurosurgery;  Laterality: N/A;   TEE WITHOUT CARDIOVERSION N/A 03/12/2020   Procedure: TRANSESOPHAGEAL ECHOCARDIOGRAM (TEE);  Surgeon: Orpah Cobb, MD;  Location: [facility] ENDOSCOPY;  Service: Cardiovascular;  Laterality: N/A;   TOTAL HIP ARTHROPLASTY Right 10/25/2015   Procedure: RIGHT TOTAL HIP ARTHROPLASTY ANTERIOR APPROACH;  Surgeon: Kathryne Hitch, MD;  Location: [facility] ORS;  Service: Orthopedics;  Laterality: Right;   TUBAL LIGATION      Social History:  Social History   Socioeconomic History   Marital status: Married    Spouse name: Caili Escalera   Number of children: 4   Years of education: GED   Highest education level: GED or equivalent  Occupational History   Occupation: retired   Tobacco Use   Smoking status: Former    Packs/day: 2.00    Years: 40.00    Pack years: 80.00    Types: Cigarettes    Quit date: 04/10/2013    Years since quitting: 7.5   Smokeless tobacco: Never  Vaping Use   Vaping Use: Former   Start date: 04/11/2013   Substances: Nicotine, Nicotine-salt  Substance and Sexual Activity   Alcohol use: Not Currently    Comment: rarely   Drug use: No   Sexual activity: Not Currently  Other Topics Concern   Not on file  Social History Narrative   Lives with husband   Right Handed   Drinks1-2 cups caffeine daily   Social Determinants of Health   Financial Resource Strain: Low Risk    Difficulty of Paying Living Expenses: Not hard at all  Food Insecurity: No Food Insecurity   Worried About Programme researcher, broadcasting/film/video in the Last Year: Never true   Ran Out of Food in the Last Year: Never true  Transportation Needs: No Transportation Needs   Lack of Transportation (Medical): No   Lack of Transportation (Non-Medical): No  Physical Activity: Inactive   Days of Exercise per Week: 0 days   Minutes of Exercise per Session: 0 min  Stress: Stress Concern Present   Feeling of Stress : To some extent  Social Connections: Moderately Integrated    Frequency of Communication with Friends and Family: More than three times a week   Frequency of Social Gatherings with Friends and Family: More than three times a week   Attends Religious Services: More than 4 times per year   Active Member of [employer] or Organizations: No   Attends Banker Meetings: Never   Marital Status: Married  Catering manager Violence: Not At Risk   Fear of Current or Ex-Partner: No   Emotionally Abused: No   Physically Abused: No   Sexually  Abused: No    Family History:  Family History  Problem Relation Age of Onset   Stroke Mother    Clotting disorder Neg Hx     Medications:   Current Outpatient Medications on File Prior to Visit  Medication Sig Dispense Refill   atorvastatin (LIPITOR) 80 MG tablet Take 1 tablet (80 mg total) by mouth daily. 30 tablet 0   calcium carbonate (TUMS EX) 750 MG chewable tablet Chew 1 tablet by mouth 2 (two) times daily as needed for heartburn.     famotidine (PEPCID) 20 MG tablet Take 20 mg by mouth daily as needed for heartburn or indigestion.     fluticasone (FLONASE) 50 MCG/ACT nasal spray Place into both nostrils.     magnesium oxide (MAG-OX) 400 MG tablet Take 1 tablet (400 mg total) by mouth daily. 30 tablet 0   methocarbamol (ROBAXIN) 500 MG tablet Take 1 tablet (500 mg total) by mouth every 6 (six) hours as needed. 40 tablet 1   oxyCODONE-acetaminophen (PERCOCET) 10-325 MG tablet SMARTSIG:1 Tablet(s) By Mouth 4-5 Times Daily     oxyCODONE-acetaminophen (PERCOCET/ROXICET) 5-325 MG tablet Take 1 tablet by mouth every 6 (six) hours as needed for moderate pain or severe pain. 20 tablet 0   predniSONE (DELTASONE) 50 MG tablet Take once daily for 5 days 5 tablet 0   rOPINIRole (REQUIP) 3 MG tablet Take 1 tablet (3 mg total) by mouth at bedtime as needed (restless leg). 30 tablet 0   No current facility-administered medications on file prior to visit.    Allergies:   Allergies  Allergen Reactions   Amoxicillin  Other (See Comments)    Tolerated Zosyn Has patient had a PCN reaction causing immediate rash, facial/tongue/throat swelling, SOB or lightheadedness with hypotension: No Has patient had a PCN reaction causing severe rash involving mucus membranes or skin necrosis: No Has patient had a PCN reaction that required hospitalization No Has patient had a PCN reaction occurring within the last 10 years: No If all of the above answers are "NO", then may proceed with Cephalosporin use.      OBJECTIVE:  Physical Exam  Vitals:   11/05/20 1505  Weight: 189 lb (85.7 kg)  Height: 5\' 3"  (1.6 m)   Body mass index is 33.48 kg/m. No results found.  General: well developed, well nourished,  pleasant elderly Caucasian female, seated, in no evident distress Head: head normocephalic and atraumatic.   Neck: supple with no carotid or supraclavicular bruits Cardiovascular: regular rate and rhythm, no murmurs; BLE edema Musculoskeletal: no deformity but bilateral shoulder elevation limited due to pain Skin:  no rash/petichiae Vascular:  Normal pulses all extremities   Neurologic Exam Mental Status: Awake and fully alert.  Fluent speech and language. Oriented to place and time. Recent and remote memory intact. Attention span, concentration and fund of knowledge appropriate. Mood and affect appropriate.  Cranial Nerves: Fundoscopic exam not done s. Pupils equal, briskly reactive to light. Extraocular movements full without nystagmus. Visual fields full   confrontation. Hearing intact. Facial sensation intact. Face, tongue, palate moves normally and symmetrically.  Motor: Normal bulk and tone. Normal strength in all tested extremity muscles except slightly decreased left hand dexterity and left hip flexor weakness.  Bilateral shoulder elevation limited due to pain. Sensory.: intact to touch , pinprick , position and vibratory sensation.  Coordination: Rapid alternating movements normal in all extremities  except slightly decreased left hand. Finger-to-nose performed accurately bilaterally and heel-to-shin difficulty performing due to knee and lower  back pain. Gait and Station: Gets out of the chair with some difficulty.  Uses a 4 pronged cane walks with slight dragging of the right leg.  Gait is broad-based and ataxic.  Unable to walk tandem. Reflexes: 1+ and symmetric. Toes downgoing.       ASSESSMENT: [name] Good is a 70 y.o. year old female presented with left-sided weakness on 10/01/2019 with stroke work-up revealing right thalamic infarct secondary to small vessel disease.  Echo showed LV thrombus and initiated Eliquis.  Vascular risk factors include HTN, HLD, CAD/MI, former tobacco use, EtOH use and morbid obesity.  New complaint of dizziness and ataxia upon arising following a recent fall likely of multifactorial etiology.  Orthostatic vital signs did not support diagnosis of orthostasis as the cause of her dizziness    PLAN: I had a long discussion with the patient and her daughter regarding her new complaints of dizziness and ataxia upon standing which may be multifactorial due to combination of recent fall and shoulder injury and pain as well as previous stroke and possibly degenerative cervical spine disease and vestibular dysfunction.  I recommend referral to home physical and occupational therapy for gait and balance training as well as dizziness exercises.  Check MRI scan of the brain for stroke and cervical spine for compressive etiology.  Continue aspirin for stroke prevention and maintain aggressive risk factor modification with strict control of hypertension with blood pressure goal below 130/90, lipids with LDL cholesterol goal below 70 mg percent diabetes with hemoglobin A1c goal below 6.5%.  She will return for follow-up in the future in 3 months or call earlier if necessary. I this was a prolonged office follow-up visit and I spent 40 minutes of face-to-face and non-face-to-face  time with patient and daughter discussion about chronic complaints of dizziness and ataxia following her recent fall and discussing need for evaluation and treatment.    Delia HeadyPramod Challis Crill, MD  [facility] [address] [place], [place] [id number]  Phone [phone] Fax [phone] Note: This document was prepared with digital dictation and possible smart phrase technology. Any transcriptional errors that result from this process are unintentional.

## 2020-11-12 ENCOUNTER — Telehealth: Payer: Self-pay

## 2020-11-12 NOTE — Telephone Encounter (Signed)
[facility], [facility], [facility], [facility], [facility], [facility], and [facility] have all declined this referral due to the payor being [facility] Medicare and inadequate staffing.  I called patient and explained this to her.  She declined my offer to send a referral for physical therapy to an outpatient rehab center.  She reports that she cannot get out of the house.  She is also unwilling to pay out-of-pocket for the services.  She would rather forego the home health physical therapy at this point.

## 2020-11-18 ENCOUNTER — Ambulatory Visit
Admission: RE | Admit: 2020-11-18 | Discharge: 2020-11-18 | Disposition: A | Discharge status: Home / Self Care | Payer: Medicare Other | Source: Ambulatory Visit | Attending: Neurology | Admitting: Neurology

## 2020-11-18 ENCOUNTER — Other Ambulatory Visit: Payer: Self-pay | Admitting: Orthopaedic Surgery

## 2020-11-18 DIAGNOSIS — R270 Ataxia, unspecified: Secondary | ICD-10-CM | POA: Diagnosis not present

## 2020-11-18 MED ORDER — GADOBENATE DIMEGLUMINE 529 MG/ML IV SOLN
15.0000 mL | Freq: Once | INTRAVENOUS | Status: AC | PRN
  Administered 2020-11-18: 15 mL via INTRAVENOUS

## 2020-11-18 NOTE — Telephone Encounter (Signed)
ok 

## 2020-11-20 ENCOUNTER — Ambulatory Visit: Payer: Medicare Other | Admitting: Orthopaedic Surgery

## 2020-11-20 ENCOUNTER — Encounter: Payer: Self-pay | Admitting: Orthopaedic Surgery

## 2020-11-20 DIAGNOSIS — M25511 Pain in right shoulder: Secondary | ICD-10-CM | POA: Diagnosis not present

## 2020-11-20 MED ORDER — METHYLPREDNISOLONE ACETATE 40 MG/ML IJ SUSP
40.0000 mg | INTRAMUSCULAR | Status: AC | PRN
  Administered 2020-11-20: 40 mg via INTRA_ARTICULAR

## 2020-11-20 MED ORDER — LIDOCAINE HCL 1 % IJ SOLN
3.0000 mL | INTRAMUSCULAR | Status: AC | PRN
  Administered 2020-11-20: 3 mL

## 2020-11-20 NOTE — Progress Notes (Signed)
The patient comes in today with continued bad right shoulder pain.  This is getting worse for her.  She says is hurting with overhead activities and reaching behind her.  Her x-rays recently did show arthritis in that shoulder and decreased subacromial outlet.  I did not place a steroid injection in the shoulder at the time because she was having multiple complaints of joint so we put her on 5 days of a steroid.  She says that it helped some but a MRI she had on Monday of her brain due to dizziness has made things worse.  Examination right shoulder shows is well located and moves fluidly and fully but is very painful to her.  I did feel it was appropriate to place a steroid injection in her right shoulder today in the subacromial outlet.  She tolerated this well.  She may end up benefiting from an intra-articular steroid injection.  I would like to see her back in 4 weeks to see how she is doing overall.  All question concerns were answered and addressed.  Procedure Note  Patient: [name] Good             Date of Birth: Sep 03, 1950           MRN: [id number]             Visit Date: 11/20/2020  Procedures: Visit Diagnoses:  1. Acute pain of right shoulder     Large Joint Inj: R subacromial bursa on 11/20/2020 12:58 PM Indications: pain and diagnostic evaluation Details: 22 G 1.5 in needle  Arthrogram: No  Medications: 3 mL lidocaine 1 %; 40 mg methylPREDNISolone acetate 40 MG/ML Outcome: tolerated well, no immediate complications Procedure, treatment alternatives, risks and benefits explained, specific risks discussed. Consent was given by the patient. Immediately prior to procedure a time out was called to verify the correct patient, procedure, equipment, support staff and site/side marked as required. Patient was prepped and draped in the usual sterile fashion.

## 2020-11-21 ENCOUNTER — Ambulatory Visit: Payer: Medicare Other | Admitting: Adult Health

## 2020-11-25 ENCOUNTER — Telehealth: Payer: Self-pay

## 2020-11-25 NOTE — Telephone Encounter (Addendum)
I called the pt and advised of results. She verbalized understanding and appreciation for the call.  

## 2020-11-25 NOTE — Telephone Encounter (Addendum)
-----   Message from Micki Riley, MD sent at 11/22/2020 12:03 PM EDT ----- Joneen Roach inform the patient that MR I scan of the brain shows evidence of previous strokes towards the top and the back on both sides and mild changes of hardening of the arteries expected with age.  No new or worrisome findings. ----- Message ----- From: Suanne Marker, MD Sent: 11/21/2020  12:29 PM EDT To: Micki Riley, MD  Micki Riley, MD  P Gna-Pod 3 Results Kindly inform the patient that MRI scan of the cervical spine shows only minor disc degenerative changes between the fourth and fifth vertebrae but no majorcompression and no need for surgery at this time.

## 2020-12-09 ENCOUNTER — Ambulatory Visit: Payer: Medicare Other | Admitting: Orthopaedic Surgery

## 2021-01-16 ENCOUNTER — Ambulatory Visit: Payer: Medicare Other | Admitting: Nurse Practitioner

## 2021-02-05 ENCOUNTER — Ambulatory Visit: Payer: Medicare Other | Admitting: Adult Health

## 2021-02-05 ENCOUNTER — Ambulatory Visit: Payer: Medicare Other | Admitting: Nurse Practitioner

## 2021-05-08 NOTE — Progress Notes (Deleted)
[facility] [address] [place]. [place] [id number] [phone]       STROKE FOLLOW UP NOTE  [name] Date of Birth:  [birth year] Medical Record Number:  [id number]   Reason for Referral: stroke follow up    SUBJECTIVE:   CHIEF COMPLAINT:  No chief complaint on file.   HPI:   Update 05/12/2020 JM: patient returns for follow up after prior visit with Dr. Pearlean BrownieSethi 6 months ago with c/o dizziness post fall. Completed MR brain 10/2020 no new areas of stroke or concerning findings. Completed MR cervical 10/2020 mild disc bulging and mild spinal stenosis but no significant findings.     History provided for reference purposes only Update 11/05/2020 Dr. Pearlean BrownieSethi : She returns for follow-up after last visit 6 months ago.  She is accompanied by her daughter.  Patient had a fall on 10/14/2020 when she hurt her shoulder.  She denies hitting her head or losing consciousness.  However since then she has been complaining of dizziness and transient vertigo and off-balance when she first stands up.  She has to hold on and has not had any falls or injuries.  She also feels her balance is off.  Patient denies any ringing in the ears or decreased hearing.  She denies significant neck pain or radicular pain.  She has switched from Eliquis to aspirin 81 mg daily which is tolerating well with minor bruising and no bleeding.  She has had no recurrent stroke or TIA symptoms.  She had orthostatic vital signs done today in the lying down position pulse rate was 86 and blood pressure 170/45, in the sitting position pulse rate was 82 and blood pressure 170/98.  Upon standing pulse rate remained 83 and blood pressure 181/112 and did not drop significantly to suggest any orthostasis.  Update 05/23/2020 Dr. Pearlean BrownieSethi : She returns for follow-up after last visit with [name] nurse practitioner in July 2021.  Patient was [facility] with another stroke on [facility]08/[facility].  She presented  with confusion altered mental status and blurred vision and loss of peripheral vision on the right side.  The night prior to admission she had an argument with her husband and headache and went to sleep. Morning the headache was resolved but her vision symptoms and confusion persisted.  CT scan done in the emergency room showed us acute to subacute left occipital cortical infarct which was new.  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  MRI scan showed patchy acute left posterior cerebral artery territory infarcts and severe changes of chronic small vessel disease and multiple old previous strokes.  Transthoracic echo showed ejection fraction of 60 to 65% and the previously seen left ventricular thrombus is no longer seen.  LDL cholesterol was 92 mg percent and hemoglobin A1c was 5.4.  Urine drug screen was negative.  Discussion was done to stop Eliquis and change to aspirin since LV clot was not resolved but patient's cardiologist Dr. Algie CofferKadakia felt she needed to stay on it longer hence it was continued.  Patient states she done well since discharge but she still has some right-sided peripheral vision loss and blurred vision.  She is getting home physical and occupational therapy.  She was able to walk with a cane but does not walk for too long.  She still  has trouble reading.  She complains of some intermittent altered temperature and sensation on the right forehead and wonders if this is related to a stroke.  She has an appointment coming with her rheumatologist soon to discuss her's swelling of her joints and pain.   Initial visit 11/23/2019 JM: [name] is being seen for hospital follow-up accompanied by her daughter.  Discharged home from CIR on 10/21/2019 after uncomplicated 16-day stay.  Residual deficits of  right-sided weakness and paresthesias.  Continues to work with home therapy with ongoing improvement.  Use of rolling walker or cane for short distance and wheelchair for long distance.  Ambulation also limited due to chronic pain of lower back and knees as well as chronic neuropathy.  She is slowly returning to independently completing ADLs but continues to need assistance with IADLs from her daughter.  Reports bilateral lower extremity L>R with improvement after cardiologist initiated furosemide.  Continues on gabapentin 400 mg 3 times daily managed by PMR  Remains on Eliquis for LV thrombus with 3 reported mild nosebleeds lasting for less than 1 minute.  She denies excessive amount of blood or difficulty stopping amiodarone.  Denies any other bleeding issues or bruising.  Plans on following cardiology with repeat echo 3 months from discharge.  Continues on atorvastatin without myalgias.  Blood pressure today 127/73.  Continues to follow with PCP regularly for HTN and HLD management.   Stroke admission 10/01/2019 [name] is a 71 y.o. female with history of CAD, depression, arthritis, GERD, RLS who presented on 10/01/2019 with L sided weakness.  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  LDL 118 initiate atorvastatin 80 mg daily.  Other stroke risk factors HTN, advanced age, former tobacco use, current EtOH use, morbid obesity and CAD/MI.  Other active problems include severe hypokalemia and dehydration due to poor p.o. intake (recent death of her mother) and RLS on Requip.  Evaluated by therapies and recommended discharge to CIR for ongoing therapy needs.  Stroke:   R thalamic infarct secondary to small vessel disease   Code Stroke CT head No acute abnormality. Small vessel disease. Atrophy.  Old R parietal occipital infarct. ASPECTS 10.    CT CS no acute trauma CTA head & neck no LVO. Proximal R ICA 50% stenosis. Severe distal R ACA short segment stenosis. Moderate atherosclerosis head and neck.  CT perfusion no core infarct, no perfusion deficits MRI  R thalamic infarct. Old R temporal occipital cortical infarct. Small vessel disease. Atrophy.  2D Echo 0.5 x 0.7 cm mobile filling defect in the LV apex with severe hypokinetic aneurysmal segment consistent with thrombus  LDL 118 HgbA1c 5.7 Lovenox 40 mg sq daily for VTE prophylaxis Inconsistently taking aspirin 325 mg daily prior to admission, now on aspirin 325 mg daily. 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 hence recommend anticoagulation with Eliquis and discontinue aspirin and Plavi Therapy recommendations:   CIR Disposition:   CIR      ROS:   14 system review of systems performed and negative with exception of swelling, weakness, pain, numbness/tingling and gait impairment  PMH:  Past Medical History:  Diagnosis Date   Anxiety    Arthritis    Depression    GERD (gastroesophageal reflux disease)    Myocardial infarction (HCC) 89   no visits to  cardiac dr(thomas kelly) since 89   Stroke (HCC)    Wound dehiscence    lumbar    PSH:  Past Surgical History:  Procedure Laterality Date   APPENDECTOMY     17 yrs   BACK SURGERY     BREAST SURGERY Left    cyst   BUBBLE STUDY  03/12/2020   Procedure: BUBBLE STUDY;  Surgeon: Orpah CobbKadakia, Ajay, MD;  Location: [facility] END[facility];  Service: Cardiovascular;;   CARDIAC CATHETERIZATION  3391   CHOLECYSTECTOMY     71 yrs old   LUMBAR LAMINECTOMY/DECOMPRESSION MICRODISCECTOMY N/A 06/14/2015   Procedure: Lumbar Three-Four,Lumbar Four-Five, Lumbar Five-Sacral One Laminectomy;  Surgeon: Tia Alertavid S Jones, MD;  Location: [facility] NEURO ORS;  Service: Neurosurgery;  Laterality: N/A;   LUMBAR WOUND DEBRIDEMENT N/A 07/24/2015   Procedure: lumbar wound  revision;  Surgeon: Tia Alertavid S Jones, MD;  Location: [facility] NEURO ORS;  Service: Neurosurgery;  Laterality: N/A;   TEE WITHOUT CARDIOVERSION N/A 03/12/2020   Procedure: TRANSESOPHAGEAL ECHOCARDIOGRAM (TEE);  Surgeon: Orpah CobbKadakia, Ajay, MD;  [facility];  Service: Cardiovascular;  Laterality: N/A;   TOTAL HIP ARTHROPLASTY Right 10/25/2015   Procedure: RIGHT TOTAL HIP ARTHROPLASTY ANTERIOR APPROACH;  Surgeon: Kathryne Hitchhristopher Y Blackman, MD;  Location: [facility] ORS;  Service: Orthopedics;  Laterality: Right;   TUBAL LIGATION      Social History:  Social History   Socioeconomic History   Marital status: Married    Spouse name: Antony OdeaJerry Ferraz   Number of children: 4   Years of education: GED   Highest education level: GED or equivalent  Occupational History   Occupation: retired   Tobacco Use   Smoking status: Former    Packs/day: 2.00    Years: 40.00    Pack years: 80.00    Types: Cigarettes    Quit date: 04/10/2013    Years since quitting: 8.0   Smokeless tobacco: Never  Vaping Use   Vaping Use: Former   Start date: 04/11/2013   Substances: Nicotine, Nicotine-salt  Substance and Sexual Activity   Alcohol use: Not Currently    Comment: rarely   Drug use: No   Sexual activity: Not Currently  Other Topics Concern   Not on file  Social History Narrative   Lives with husband   Right Handed   Drinks1-2 cups caffeine daily   Social Determinants of Health   Financial Resource Strain: Not on file  Food Insecurity: Not on file  Transportation Needs: Not on file  Physical Activity: Not on file  Stress: Not on file  Social Connections: Not on file  Intimate Partner Violence: Not on file    Family History:  Family History  Problem Relation Age of Onset   Stroke Mother    Clotting disorder Neg Hx     Medications:   Current Outpatient Medications on File Prior to Visit  Medication Sig Dispense Refill   atorvastatin (LIPITOR) 80 MG tablet Take 1 tablet (80 mg total) by mouth daily. 30  tablet 0   calcium carbonate (TUMS EX) 750 MG chewable tablet Chew 1 tablet by mouth 2 (two) times daily as needed for heartburn.     famotidine (PEPCID) 20 MG tablet Take 20 mg by mouth daily as needed for heartburn or indigestion.     fluticasone (FLONASE) 50 MCG/ACT nasal spray Place into both nostrils.     magnesium oxide (MAG-OX) 400 MG tablet Take 1 tablet (400 mg total) by mouth daily. 30 tablet 0   methocarbamol (ROBAXIN) 500 MG tablet Take 1  tablet (500 mg total) by mouth every 6 (six) hours as needed. 40 tablet 1   oxyCODONE-acetaminophen (PERCOCET) 10-325 MG tablet SMARTSIG:1 Tablet(s) By Mouth 4-5 Times Daily     oxyCODONE-acetaminophen (PERCOCET/ROXICET) 5-325 MG tablet Take 1 tablet by mouth every 6 (six) hours as needed for moderate pain or severe pain. 20 tablet 0   predniSONE (DELTASONE) 50 MG tablet Take once daily for 5 days 5 tablet 0   rOPINIRole (REQUIP) 3 MG tablet Take 1 tablet (3 mg total) by mouth at bedtime as needed (restless leg). 30 tablet 0   No current facility-administered medications on file prior to visit.    Allergies:   Allergies  Allergen Reactions   Amoxicillin Other (See Comments)    Tolerated Zosyn Has patient had a PCN reaction causing immediate rash, facial/tongue/throat swelling, SOB or lightheadedness with hypotension: No Has patient had a PCN reaction causing severe rash involving mucus membranes or skin necrosis: No Has patient had a PCN reaction that required hospitalization No Has patient had a PCN reaction occurring within the last 10 years: No If all of the above answers are "NO", then may proceed with Cephalosporin use.      OBJECTIVE:  Physical Exam  There were no vitals filed for this visit.  There is no height or weight on file to calculate BMI. No results found.  General: well developed, well nourished,  pleasant elderly Caucasian female, seated, in no evident distress Head: head normocephalic and atraumatic.   Neck:  supple with no carotid or supraclavicular bruits Cardiovascular: regular rate and rhythm, no murmurs; BLE edema Musculoskeletal: no deformity Skin:  no rash/petichiae Vascular:  Normal pulses all extremities   Neurologic Exam Mental Status: Awake and fully alert.  Fluent speech and language. Oriented to place and time. Recent and remote memory intact. Attention span, concentration and fund of knowledge appropriate. Mood and affect appropriate.  Cranial Nerves: Fundoscopic exam reveals sharp disc margins. Pupils equal, briskly reactive to light. Extraocular movements full without nystagmus. Visual fields full to confrontation. Hearing intact. Facial sensation intact. Face, tongue, palate moves normally and symmetrically.  Motor: Normal bulk and tone. Normal strength in all tested extremity muscles except slightly decreased left hand dexterity and left hip flexor weakness. Sensory.: intact to touch , pinprick , position and vibratory sensation.  Coordination: Rapid alternating movements normal in all extremities except slightly decreased left hand. Finger-to-nose performed accurately bilaterally and heel-to-shin difficulty performing due to knee and lower back pain. Gait and Station: Deferred as currently in wheelchair and assistive device not present during visit Reflexes: 1+ and symmetric. Toes downgoing.         ASSESSMENT: LINN CLAVIN is a 72 y.o. 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 Vascular risk factors include hx of LV thrombus (resolved), HTN, HLD, CAD/MI, former tobacco use, EtOH use and morbid obesity. Eval by Dr. Pearlean Brownie 10/2020 for new complaint of dizziness and ataxia upon arising following a recent fall likely of multifactorial etiology.       PLAN:  Dizziness: Repeat MR brain and cervical largely unremarkable Orthostatics negative  Likely multifactorial  due to combination of prior fall with shoulder injury and prior strokes per Dr. Pearlean Brownie Hx of Right thalamic stroke: Hx of left PCA stroke:  Residual deficit: Left hemiparesis with paresthesias.  Encouraged continued participation in home health PT/OT for likely ongoing improvement.  Will continue to follow with  PMR for ongoing nerve pain management.   -Continue aspirin 81 mg daily  and atorvastatin for secondary stroke prevention.  Advised that once Eliquis course completed, will be recommended to initiate aspirin 81 mg daily for secondary stroke prevention -Close PCP follow up for aggressive stroke risk factor management  LV thrombus: resolved on repeat 2D echo. Completed AC duration per cardiology.  Continue monitoring per cardiology HTN: BP goal <130/90.  Stable today.  Continue f/u with PCP HLD: LDL goal <70.  Prior LDL 92 on atorvastatin 80 mg daily per PCP.    Follow up in 3 months or call earlier if needed   I spent 45 minutes of face-to-face and non-face-to-face time with patient and daughter.  This included previsit chart review, lab review, study review, order entry, electronic health record documentation, patient education regarding recent stroke, residual deficits, importance of managing stroke risk factors and answered all questions to patient satisfaction     Ihor Austin, [facility]  [facility] [address] [place], [place] [address]  Phone [phone] Fax [phone] Note: This document was prepared with digital dictation and possible smart phrase technology. Any transcriptional errors that result from this process are unintentional.

## 2021-05-12 ENCOUNTER — Ambulatory Visit: Payer: Medicare Other | Admitting: Adult Health

## 2021-05-12 ENCOUNTER — Encounter: Payer: Self-pay | Admitting: Adult Health

## 2021-06-12 ENCOUNTER — Ambulatory Visit: Payer: Medicare Other | Admitting: Adult Health

## 2021-06-16 ENCOUNTER — Encounter: Payer: Self-pay | Admitting: Neurology

## 2021-07-07 ENCOUNTER — Ambulatory Visit: Payer: Medicare Other | Admitting: Physician Assistant

## 2021-07-09 ENCOUNTER — Telehealth: Payer: Self-pay | Admitting: Family Medicine

## 2021-07-09 NOTE — Telephone Encounter (Signed)
Yes that is OK. ? ?[name] Good. Jerline Pain, MD ?07/09/2021 9:01 AM  ? ?

## 2021-07-09 NOTE — Telephone Encounter (Signed)
Ok to schedule new patient visit with Dr [name] ?

## 2021-07-09 NOTE — Telephone Encounter (Signed)
Pt is asking to become a pt of Dr Marigene Ehlers. Her son-in-law, Fuller Canada, is already a pt. Please advise. ?

## 2021-07-09 NOTE — Telephone Encounter (Signed)
Please advise 

## 2021-07-15 ENCOUNTER — Ambulatory Visit (INDEPENDENT_AMBULATORY_CARE_PROVIDER_SITE_OTHER): Payer: Medicare HMO | Admitting: Family Medicine

## 2021-07-15 ENCOUNTER — Encounter: Payer: Self-pay | Admitting: Family Medicine

## 2021-07-15 VITALS — HR 76 | Temp 98.5°F | Ht 61.42 in | Wt 206.2 lb

## 2021-07-15 DIAGNOSIS — R739 Hyperglycemia, unspecified: Secondary | ICD-10-CM

## 2021-07-15 DIAGNOSIS — F321 Major depressive disorder, single episode, moderate: Secondary | ICD-10-CM | POA: Insufficient documentation

## 2021-07-15 DIAGNOSIS — R42 Dizziness and giddiness: Secondary | ICD-10-CM | POA: Diagnosis not present

## 2021-07-15 DIAGNOSIS — G2581 Restless legs syndrome: Secondary | ICD-10-CM

## 2021-07-15 DIAGNOSIS — I6509 Occlusion and stenosis of unspecified vertebral artery: Secondary | ICD-10-CM | POA: Diagnosis not present

## 2021-07-15 DIAGNOSIS — Z23 Encounter for immunization: Secondary | ICD-10-CM

## 2021-07-15 DIAGNOSIS — I513 Intracardiac thrombosis, not elsewhere classified: Secondary | ICD-10-CM

## 2021-07-15 DIAGNOSIS — Z79899 Other long term (current) drug therapy: Secondary | ICD-10-CM

## 2021-07-15 DIAGNOSIS — M1990 Unspecified osteoarthritis, unspecified site: Secondary | ICD-10-CM

## 2021-07-15 DIAGNOSIS — I679 Cerebrovascular disease, unspecified: Secondary | ICD-10-CM

## 2021-07-15 LAB — COMPREHENSIVE METABOLIC PANEL
ALT: 19 U/L (ref 0–35)
AST: 23 U/L (ref 0–37)
Albumin: 4.2 g/dL (ref 3.5–5.2)
Alkaline Phosphatase: 87 U/L (ref 39–117)
BUN: 18 mg/dL (ref 6–23)
CO2: 26 mEq/L (ref 19–32)
Calcium: 9.7 mg/dL (ref 8.4–10.5)
Chloride: 101 mEq/L (ref 96–112)
Creatinine, Ser: 0.9 mg/dL (ref 0.40–1.20)
GFR: 64.05 mL/min (ref >60.00)
Glucose, Bld: 78 mg/dL (ref 70–99)
Potassium: 4.2 mEq/L (ref 3.5–5.1)
Sodium: 139 mEq/L (ref 135–145)
Total Bilirubin: 0.4 mg/dL (ref 0.2–1.2)
Total Protein: 7.2 g/dL (ref 6.0–8.3)

## 2021-07-15 LAB — CBC
HCT: 43 % (ref 36.0–46.0)
Hemoglobin: 14.5 g/dL (ref 12.0–15.0)
MCHC: 33.8 g/dL (ref 30.0–36.0)
MCV: 92.9 fl (ref 78.0–100.0)
Platelets: 275 10*3/uL (ref 150.0–400.0)
RBC: 4.63 Mil/uL (ref 3.87–5.11)
RDW: 13.1 % (ref 11.5–15.5)
WBC: 10.3 10*3/uL (ref 4.0–10.5)

## 2021-07-15 LAB — LIPID PANEL
Cholesterol: 201 mg/dL — ABNORMAL HIGH (ref 0–200)
HDL: 57.3 mg/dL (ref >39.00)
LDL Cholesterol: 112 mg/dL — ABNORMAL HIGH (ref 0–99)
NonHDL: 144.15
Total CHOL/HDL Ratio: 4
Triglycerides: 160 mg/dL — ABNORMAL HIGH (ref 0.0–149.0)
VLDL: 32 mg/dL (ref 0.0–40.0)

## 2021-07-15 LAB — TSH: TSH: 2.13 u[IU]/mL (ref 0.35–5.50)

## 2021-07-15 LAB — HEMOGLOBIN A1C: Hgb A1c MFr Bld: 6 % (ref 4.6–6.5)

## 2021-07-15 LAB — VITAMIN B12: Vitamin B-12: 311 pg/mL (ref 211–911)

## 2021-07-15 NOTE — Assessment & Plan Note (Signed)
On Lexapro 10 mg daily.  We will continue for now.  Follow-up in 3 months. ?

## 2021-07-15 NOTE — Assessment & Plan Note (Signed)
Patient found to have occlusion of right vertebral artery on CTA a couple of years ago during her hospitalization for stroke.  It is possible she could be having ongoing posterior circulation issues that is causing her issues with recurrent dizziness and blurred vision.  We will recheck MRA.  She has upcoming appointment with neurology. ?

## 2021-07-15 NOTE — Assessment & Plan Note (Signed)
Follows with orthopedics.  She does have history of right hip replacement.  Has arthritis in bilateral knees and hands.  Uses Percocet 10-3 25 4  times daily as needed.  Previous PCP was prescribing this for her.  She does not need refill today. ?

## 2021-07-15 NOTE — Assessment & Plan Note (Signed)
On Requip 3 mg at bedtime as needed. ?

## 2021-07-15 NOTE — Patient Instructions (Signed)
It was very nice to see you today! ? ?We will check blood work and an MRI of the arteries in your neck. ? ?No medication changes.  Come back to see me in 3 months.  Come back sooner if needed. ? ?Take care, ?Dr [name] ? ?PLEASE NOTE: ? ?If you had any lab tests please let us know if you have not heard back within a few days. You may see your results on mychart before we have a chance to review them but we will give you a call once they are reviewed by [place]. If we ordered any referrals today, please let us know if you have not heard from their office within the next week.  ? ?Please try these tips to maintain a healthy lifestyle: ? ?Eat at least 3 REAL meals and 1-2 snacks per day.  Aim for no more than 5 hours between eating.  If you eat breakfast, please do so within one hour of getting up.  ? ?Each meal should contain half fruits/vegetables, one quarter protein, and one quarter carbs (no bigger than a computer mouse) ? ?Cut down on sweet beverages. This includes juice, soda, and sweet tea.  ? ?Drink at least 1 glass of water with each meal and aim for at least 8 glasses per day ? ?Exercise at least 150 minutes every week.   ?

## 2021-07-15 NOTE — Progress Notes (Signed)
? ?[name] Good is a 71 y.o. female who presents today for an office visit. ? ?Assessment/Plan:  ?New/Acute Problems: ?Dizziness ?Unclear etiology.  No current symptoms at rest though symptoms are worsened with standing. She has seen neurology intermittently for this over the last year or so.  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  This could be contributing to her symptoms.  She was also found to have vertebral artery occlusion on the CTA scan from a couple of years ago.  Orthostatic vital signs today were negative.  We will repeat labs as this has not been done in over a year.  We will repeat MRA to evaluate her vertebral arteries as below.  She will follow-up with neurology in a couple of months. ? ?Chronic Problems Addressed Today: ?Vertebral artery stenosis ?Patient found to have occlusion of right vertebral artery on CTA a couple of years ago during her hospitalization for stroke.  It is possible she could be having ongoing posterior circulation issues that is causing her issues with recurrent dizziness and blurred vision.  We will recheck MRA.  She has upcoming appointment with neurology. ? ?Depression, major, single episode, moderate (HCC) ?On Lexapro 10 mg daily.  We will continue for now.  Follow-up in 3 months. ? ?RLS (restless legs syndrome) ?On Requip 3 mg at bedtime as needed. ? ?Arthritis ?Follows with orthopedics.  She does have history of right hip replacement.  Has arthritis in bilateral knees and hands.  Uses Percocet 10-3 25 4  times daily as needed.  Previous PCP was prescribing this for her.  She does not need refill today. ? ?Cerebrovascular disease ?Check lipids.  She is on Lipitor 80 mg daily and aspirin 325 mg daily.  We will be rechecking MRA as above. ? ?Prevnar 20 given today.  ? ?  ?Subjective:   ?HPI: ? ?Patient is here as a new patient to establish care. ? ?See A/P for status of chronic conditions. ? ?Her main concern at this point is intermittent dizziness.   She notes she has been experiencing dizziness since suffering a significant fall a few years ago. Symptoms get worse after standing up.does not have any symptoms at rest.  She notes she has noticed some vision changes. She has been feeling off balance.  No room spinning sensation.  She would have blurred vision associated with the dizziness.  Feels off balance.  She has been seeing neurology for this but I have not found any specific etiologies.  She is also saw her eye doctor who told her everything was fine with her vision.  Her next visit with a neurologist is in a couple of months. ? ?She is having issue with her depression. She is currently on Lexapro 20 mg daily. This seems to be not helping. She is tolerating her medication well. No side effects. She notes symptoms seems to be getting worse.Her depression is mostly related to her dizziness and unable to do things. She had gained about 30 pounds for the past few years. She also had depression related to her weight gain. No SI or HI. ? ?ROS: Per HPI, otherwise a complete review of systems was negative.  ? ?PMH: ? ?The following were reviewed and entered/updated in epic: ?Past Medical History:  ?Diagnosis Date  ? Anxiety   ? Arthritis   ? Depression   ?  GERD (gastroesophageal reflux disease)   ? Myocardial infarction (HCC) 91  ? no visits to cardiac dr(thomas kelly) since 92  ? Stroke [facility])   ? Wound dehiscence   ? lumbar  ? ?Patient Active Problem List  ? Diagnosis Date Noted  ? Depression, major, single episode, moderate (HCC) 07/15/2021  ? Vertebral artery stenosis 07/15/2021  ? Cerebrovascular disease 07/15/2021  ? 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 RLS (restless legs syndrome) 10/02/2019  ? Arthritis 10/18/2017  ? Former smoker 11/13/2016  ? Autoimmune disease (HCC) 08/12/2016   ? 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 S/P lumbar laminectomy 06/14/2015  ? ?Past Surgical History:  ?Procedure Laterality Date  ? APPENDECTOMY    ? 17 yrs  ? BACK SURGERY    ? BREAST SURGERY Left   ? cyst  ? BUBBLE STUDY  03/12/2020  ? Procedure: BUBBLE STUDY;  Surgeon: Orpah Cobb, MD;  [facility];  Service: Cardiovascular;;  ? CARDIAC CATHETERIZATION  91  ? CHOLECYSTECTOMY    ? 71 yrs old  ? LUMBAR LAMINECTOMY/DECOMPRESSION MICRODISCECTOMY N/A 06/14/2015  ? Procedure: Lumbar Three-Four,Lumbar Four-Five, Lumbar Five-Sacral One Laminectomy;  Surgeon: Tia Alert, MD;  [facility]: [facility];  Service: Neurosurgery;  Laterality: N/A;  ? LUMBAR WOUND DEBRIDEMENT N/A 07/24/2015  ? Procedure: lumbar wound revision;  Surgeon: Tia Alert, MD;  Location: [facility];  Service: Neurosurgery;  Laterality: N/A;  ? TEE WITHOUT CARDIOVERSION N/A 03/12/2020  ? Procedure: TRANSESOPHAGEAL ECHOCARDIOGRAM (TEE);  Surgeon: Orpah Cobb, MD;  Location: [facility] ENDOSCOPY;  Service: Cardiovascular;  Laterality: N/A;  ? TOTAL HIP ARTHROPLASTY Right 10/25/2015  ? Procedure: RIGHT TOTAL HIP ARTHROPLASTY ANTERIOR APPROACH;  Surgeon: Kathryne Hitch, MD;  Location: [facility];  Service: Orthopedics;  Laterality: Right;  ? TUBAL LIGATION    ? ? ?Family History  ?Problem Relation Age of Onset  ? Stroke Mother   ? Clotting disorder Neg Hx   ? ? ?Medications- reviewed and updated ?Current Outpatient Medications  ?Medication Sig Dispense Refill  ? calcium carbonate (TUMS EX) 750 MG chewable tablet Chew 1 tablet by mouth 2 (two) times daily as needed for heartburn.    ? escitalopram (LEXAPRO) 20 MG tablet Take 20 mg by mouth daily.    ? famotidine (PEPCID) 20 MG tablet Take 20 mg by mouth daily as needed for heartburn or indigestion.    ? fluticasone (FLONASE) 50 MCG/ACT nasal spray Place into both nostrils.    ? oxyCODONE-acetaminophen (PERCOCET) 10-325 MG tablet SMARTSIG:1 Tablet(s) By Mouth 4-5 Times Daily     ? rOPINIRole (REQUIP) 3 MG tablet Take 1 tablet (3 mg total) by mouth at bedtime as needed (restless leg). 30 tablet 0  ? atorvastatin (LIPITOR) 80 MG tablet Take 1 tablet (80 mg total) by mouth daily. (Patient not taking: Reported on 07/15/2021) 30 tablet 0  ? ?No current facility-administered medications for this visit.  ? ? ?Allergies-reviewed and updated ?Allergies  ?Allergen Reactions  ? Amoxicillin Other (See Comments)  ?  Tolerated Zosyn ?Has patient had a PCN reaction causing immediate rash, facial/tongue/throat swelling, SOB or lightheadedness with hypotension: No ?Has patient had a PCN reaction causing severe rash involving mucus membranes or skin necrosis: No ?Has patient had a PCN reaction that required hospitalization No ?Has patient had a PCN reaction occurring within the last 10 years: No ?If all of the above answers are "NO", then may proceed with Cephalosporin use.  ? ? ?Social History  ? ?Socioeconomic  History  ? Marital status: Married  ?  Spouse name: Elana Jian  ? Number of children: 4  ? Years of education: GED  ? Highest education level: GED or equivalent  ?Occupational History  ? Occupation: retired   ?Tobacco Use  ? Smoking status: Former  ?  Packs/day: 2.00  ?  Years: 40.00  ?  Pack years: 80.00  ?  Types: Cigarettes  ?  Quit date: 04/10/2013  ?  Years since quitting: 8.2  ? Smokeless tobacco: Never  ?Vaping Use  ? Vaping Use: Former  ? Start date: 04/11/2013  ? Substances: Nicotine, Nicotine-salt  ?Substance and Sexual Activity  ? Alcohol use: Not Currently  ?  Comment: rarely  ? Drug use: No  ? Sexual activity: Not Currently  ?Other Topics Concern  ? Not on file  ?Social History Narrative  ? Lives with husband  ? Right Handed  ? Drinks1-2 cups caffeine daily  ? ?Social Determinants of Health  ? ?Financial Resource Strain: Not on file  ?Food Insecurity: Not on file  ?Transportation Needs: Not on file  ?Physical Activity: Not on file  ?Stress: Not on file  ?Social Connections: Not on file   ? ? ? ?   ?  ?Objective:  ?Physical Exam: ?Pulse 76   Temp 98.5 ?F (36.9 ?C) (Temporal)   Ht 5' 1.42" (1.56 m)   Wt 206 lb 3.2 oz (93.5 kg)   SpO2 96%   BMI 38.43 kg/m?   ?Orthostatic VS for the past 24 hrs: ? BP

## 2021-07-15 NOTE — Assessment & Plan Note (Signed)
Check lipids.  She is on Lipitor 80 mg daily and aspirin 325 mg daily.  We will be rechecking MRA as above. ?

## 2021-07-16 ENCOUNTER — Other Ambulatory Visit: Payer: Self-pay | Admitting: Family Medicine

## 2021-07-17 ENCOUNTER — Ambulatory Visit: Payer: Medicare HMO | Admitting: Physician Assistant

## 2021-07-17 ENCOUNTER — Encounter: Payer: Self-pay | Admitting: Physician Assistant

## 2021-07-17 ENCOUNTER — Ambulatory Visit (INDEPENDENT_AMBULATORY_CARE_PROVIDER_SITE_OTHER): Payer: Medicare HMO

## 2021-07-17 ENCOUNTER — Other Ambulatory Visit: Payer: Self-pay

## 2021-07-17 DIAGNOSIS — M25562 Pain in left knee: Secondary | ICD-10-CM | POA: Diagnosis not present

## 2021-07-17 DIAGNOSIS — G8929 Other chronic pain: Secondary | ICD-10-CM

## 2021-07-17 MED ORDER — METHYLPREDNISOLONE ACETATE 40 MG/ML IJ SUSP
40.0000 mg | INTRAMUSCULAR | Status: AC | PRN
  Administered 2021-07-17: 40 mg via INTRA_ARTICULAR

## 2021-07-17 MED ORDER — LIDOCAINE HCL 1 % IJ SOLN
3.0000 mL | INTRAMUSCULAR | Status: AC | PRN
  Administered 2021-07-17: 3 mL

## 2021-07-17 NOTE — Progress Notes (Signed)
? ?Office Visit Note ?  ?Patient: [name] Good           ?Date of Birth: April 23, 1951           ?MRN: [id number] ?Visit Date: 07/17/2021 ?             ?Requested by: [name] Puffer, MD ?[address] ?[place],  [place] [address] ?PCP: [name] Dark, MD ? ? ?Assessment & Plan: ?Visit Diagnoses:  ?1. Chronic pain of left knee   ? ? ?Plan: Discussed with with [name] Good the radiographic findings.  She basically has bone-on-bone medial compartment.  Treatments include cortisone injections, anti-inflammatories but oral and topical however she did not have a history of MI would not recommend oral NSAIDs.  And total knee arthroplasty.  However due to her possible current dizziness and blurred vision and this need further work-up given her right vertebral artery CTA which showed occlusion couple years ago during a hospitalization after stroke would not recommend any surgical intervention at this time.  Therefore recommended cortisone injection in the knee today she was agreeable.  We perform this without incident.  She understands she needs to wait least 3 months between injections.  We discussed quad strengthening with her. ? ?Follow-Up Instructions: Return if symptoms worsen or fail to improve.  ? ?Orders:  ?Orders Placed This Encounter  ?Procedures  ? Large Joint Inj: L knee  ? XR Knee 1-2 Views Left  ? ?No orders of the defined types were placed in this encounter. ? ? ? ? Procedures: ?Large Joint Inj: L knee on 07/17/2021 1:39 PM ?Indications: pain ?Details: 22 G 1.5 in needle, anterolateral approach ? ?Arthrogram: No ? ?Medications: 3 mL lidocaine 1 %; 40 mg methylPREDNISolone acetate 40 MG/ML ?Outcome: tolerated well, no immediate complications ?Procedure, treatment alternatives, risks and benefits explained, specific risks discussed. Consent was given by the patient. Immediately prior to procedure a time out was called to verify the correct patient, procedure, equipment, support staff and site/side marked as required. Patient  was prepped and draped in the usual sterile fashion.  ? ? ? ? ?Clinical Data: ?No additional findings. ? ? ?Subjective: ?Chief Complaint  ?Patient presents with  ? Left Knee - Pain  ? ? ?HPI ?[name] Good 71 year old female comes in today with left knee pain.  Pains been ongoing for few months.  She is using a walker.  She has had no known injury.  She is nondiabetic.  She is on chronic Percocet and uses salon patches on her knee.  She is also on 81 mg aspirin with a history of stroke.  She reports that she has been worked up for dizziness and blurred vision of unknown origin. ?Review of Systems ?See HPI. ? ?Objective: ?Vital Signs: There were no vitals taken for this visit. ? ?Physical Exam ?Constitutional:   ?   Appearance: She is not ill-appearing or diaphoretic.  ?Pulmonary:  ?   Effort: Pulmonary effort is normal.  ?Neurological:  ?   Mental Status: She is alert and oriented to person, place, and time.  ?Psychiatric:     ?   Mood and Affect: Mood normal.  ? ? ?Ortho Exam ?Left knee full extension full flexion.  No instability valgus varus stressing.  Tenderness along lateral joint line.  No effusion abnormal warmth erythema of the left knee. ? ?Specialty Comments:  ?No specialty comments available. ? ?Imaging: ?XR Knee 1-2 Views Left ? ?Result Date: 07/17/2021 ?Left knee 2 views: Slight valgus deformity.  Bone-on-bone lateral compartment.  Moderate to severe patellofemoral changes.  No acute fractures.  No other bony lesions.  Knee is well located.  ? ? ?PMFS History: ?Patient Active Problem List  ? Diagnosis Date Noted  ? Depression, major, single episode, moderate (HCC) 07/15/2021  ? Vertebral artery stenosis 07/15/2021  ? Cerebrovascular disease 07/15/2021  ? 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 RLS (restless legs syndrome) 10/02/2019  ? Arthritis 10/18/2017  ? Former smoker 11/13/2016  ? Autoimmune disease (HCC) 08/12/2016  ? 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 S/P lumbar  laminectomy 06/14/2015  ? ?Past Medical History:  ?Diagnosis Date  ? Anxiety   ? Arthritis   ? Depression   ? GERD (gastroesophageal reflux disease)   ? Myocardial infarction (HCC) 91  ? no visits to cardiac dr(thomas kelly) since 92  ? Stroke [facility])   ? Wound dehiscence   ? lumbar  ?  ?Family History  ?Problem Relation Age of Onset  ? Stroke Mother   ? Clotting disorder Neg Hx   ?  ?Past Surgical History:  ?Procedure Laterality Date  ? APPENDECTOMY    ? 17 yrs  ? BACK SURGERY    ? BREAST SURGERY Left   ? cyst  ? BUBBLE STUDY  03/12/2020  ? Procedure: BUBBLE STUDY;  Surgeon: Orpah Cobb, MD;  Location: [facility];  Service: Cardiovascular;;  ? CARDIAC CATHETERIZATION  91  ? CHOLECYSTECTOMY    ? 71 yrs old  ? LUMBAR LAMINECTOMY/DECOMPRESSION MICRODISCECTOMY N/A 06/14/2015  ? Procedure: Lumbar Three-Four,Lumbar Four-Five, Lumbar Five-Sacral One Laminectomy;  Surgeon: Tia Alert, MD;  Location: [facility];  Service: Neurosurgery;  Laterality: N/A;  ? LUMBAR WOUND DEBRIDEMENT N/A 07/24/2015  ? Procedure: lumbar wound revision;  Surgeon: Tia Alert, MD;  Location: [facility];  Service: Neurosurgery;  Laterality: N/A;  ? TEE WITHOUT CARDIOVERSION N/A 03/12/2020  ? Procedure: TRANSESOPHAGEAL ECHOCARDIOGRAM (TEE);  Surgeon: Orpah Cobb, MD;  Location: [facility] ENDOSCOPY;  Service: Cardiovascular;  Laterality: N/A;  ? TOTAL HIP ARTHROPLASTY Right 10/25/2015  ? Procedure: RIGHT TOTAL HIP ARTHROPLASTY ANTERIOR APPROACH;  Surgeon: Kathryne Hitch, MD;  Location: [facility]S;  Service: Orthopedics;  Laterality: Right;  ? TUBAL LIGATION    ? ?Social History  ? ?Occupational History  ? Occupation: retired   ?Tobacco Use  ? Smoking status: Former  ?  Packs/day: 2.00  ?  Years: 40.00  ?  Pack years: 80.00  ?  Types: Cigarettes  ?  Quit date: 04/10/2013  ?  Years since quitting: 8.2  ? Smokeless tobacco: Never  ?Vaping Use  ? Vaping Use: Former  ? Start date: 04/11/2013  ? Substances: Nicotine, Nicotine-salt  ?Substance and  Sexual Activity  ? Alcohol use: Not Currently  ?  Comment: rarely  ? Drug use: No  ? Sexual activity: Not Currently  ? ? ? ? ? ? ?

## 2021-07-18 NOTE — Progress Notes (Signed)
Please inform patient of the following: ? ?Her cholesterol levels are still elevated above where they should ideally be.  There is not much else I can do in terms of medication management though it may be a good idea for her to see a cholesterol specialist through cardiology.  Please place referral if she is interested. ? ?Her blood sugar is borderline.  Do not need to make any changes but we can recheck again next year. ? ?Everything else is normal.

## 2021-07-19 ENCOUNTER — Ambulatory Visit (HOSPITAL_BASED_OUTPATIENT_CLINIC_OR_DEPARTMENT_OTHER)
Admission: RE | Admit: 2021-07-19 | Discharge: 2021-07-19 | Disposition: A | Discharge status: Home / Self Care | Payer: Medicare HMO | Source: Ambulatory Visit | Attending: Family Medicine | Admitting: Family Medicine

## 2021-07-19 DIAGNOSIS — R42 Dizziness and giddiness: Secondary | ICD-10-CM | POA: Insufficient documentation

## 2021-07-19 DIAGNOSIS — I6622 Occlusion and stenosis of left posterior cerebral artery: Secondary | ICD-10-CM | POA: Diagnosis not present

## 2021-07-19 IMAGING — MR MR MRA HEAD W/O CM
1 series · 19 of 48 positions shown · non-contrast
Comparison: MRI [DATE], CTA head [DATE]

CLINICAL DATA: Stroke follow-up

EXAM:
MRA HEAD WITHOUT CONTRAST
TECHNIQUE: Angiographic images of the Circle of Willis were acquired using MRA
technique without intravenous contrast.

[Series 2: [id number]-slab · axial · 0.5mm · 0.35mm/px · z∈[-55,+35]mm · 19 of 191 slices shown]
[im 1/191]
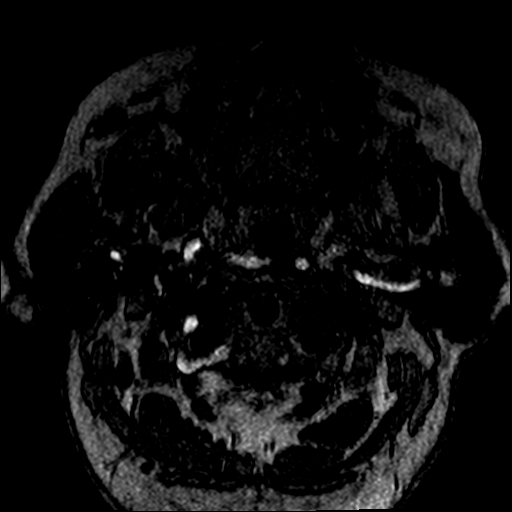
[im 5/191]
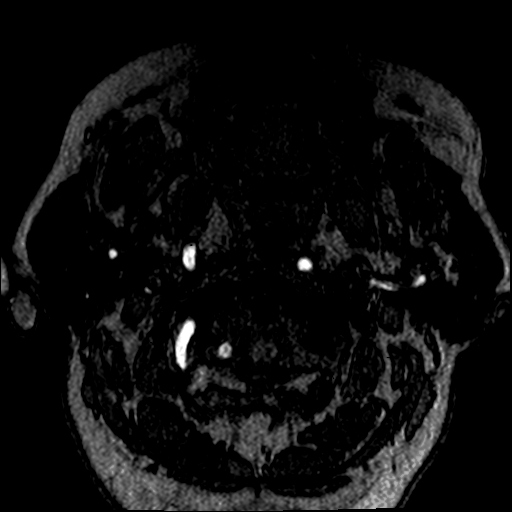
[im 9/191]
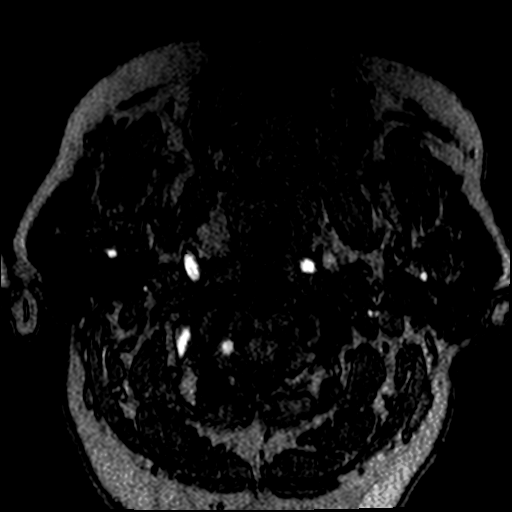
[im 13/191]
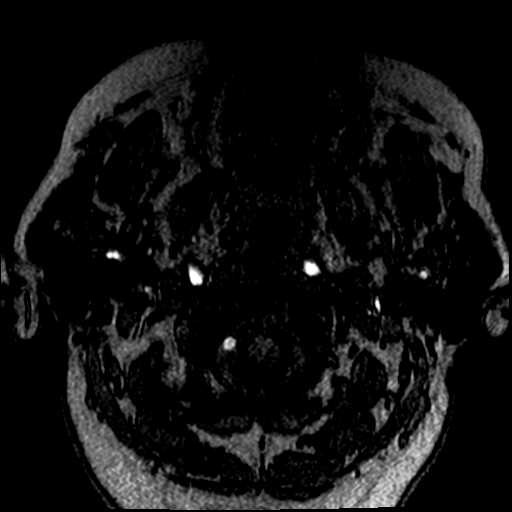
[im 17/191]
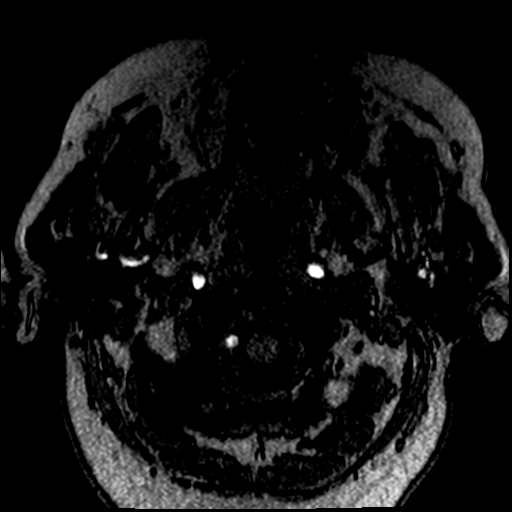
[im 21/191]
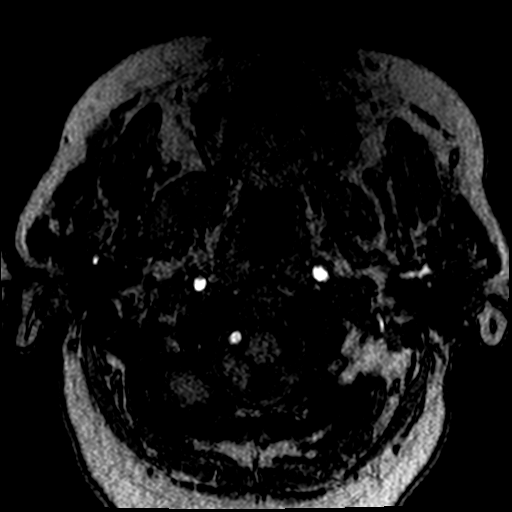
[im 25/191]
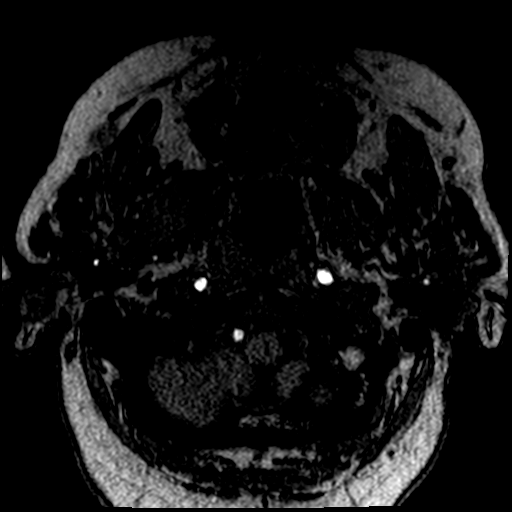
[im 29/191]
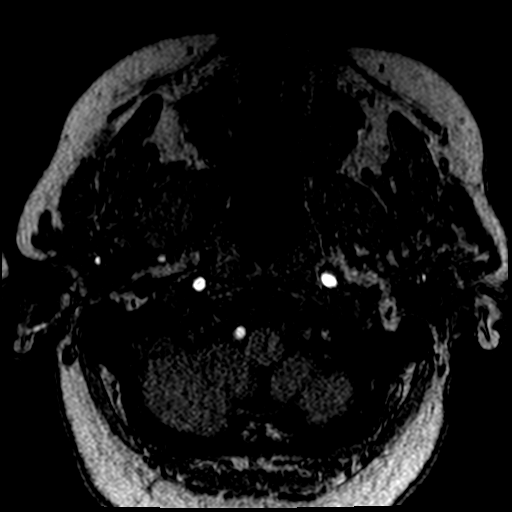
[im 33/191]
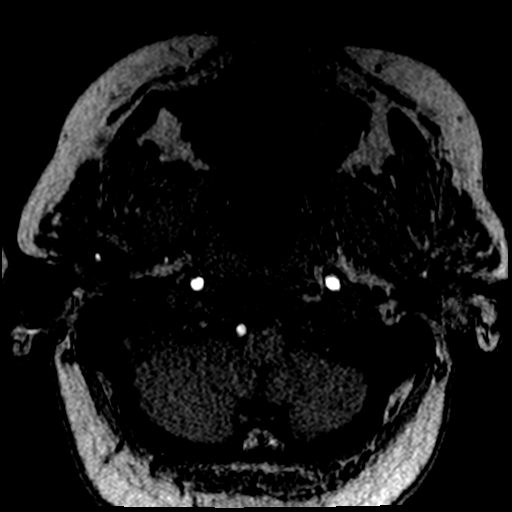
[im 37/191]
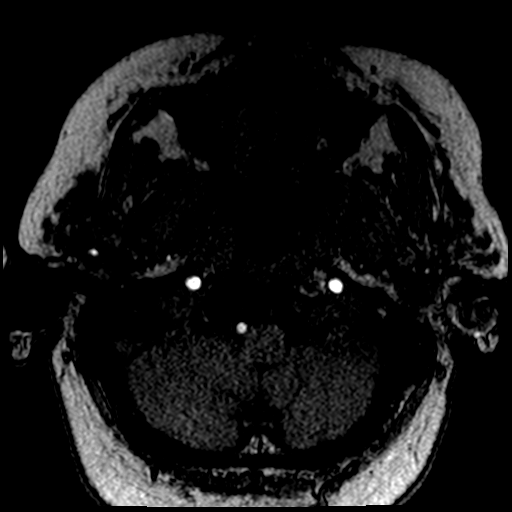
[im 41/191]
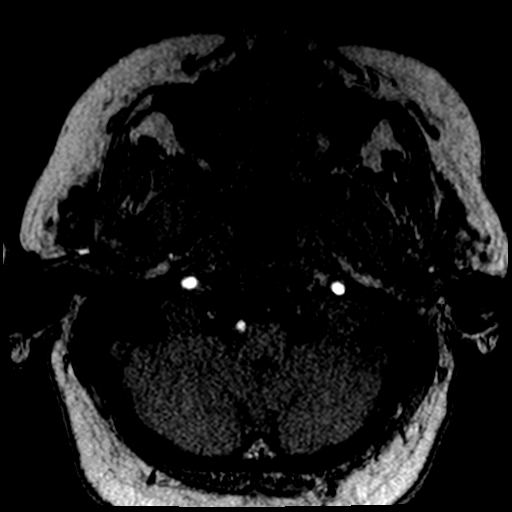
[im 61/191]
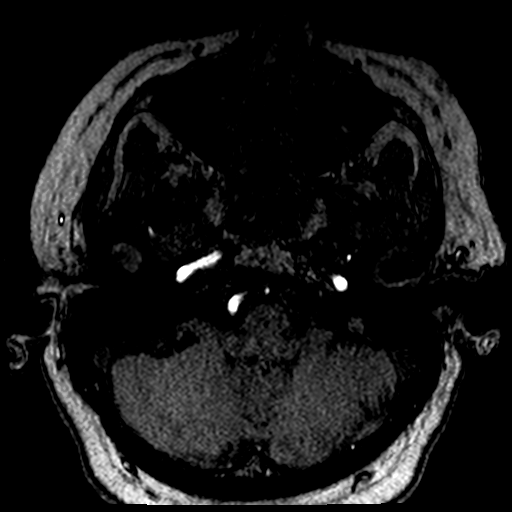
[im 85/191]
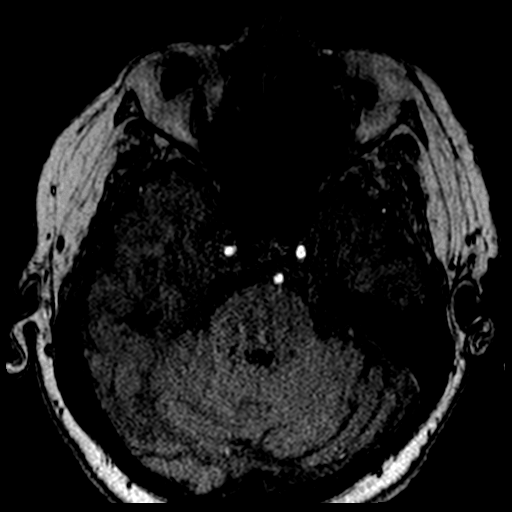
[im 98/191]
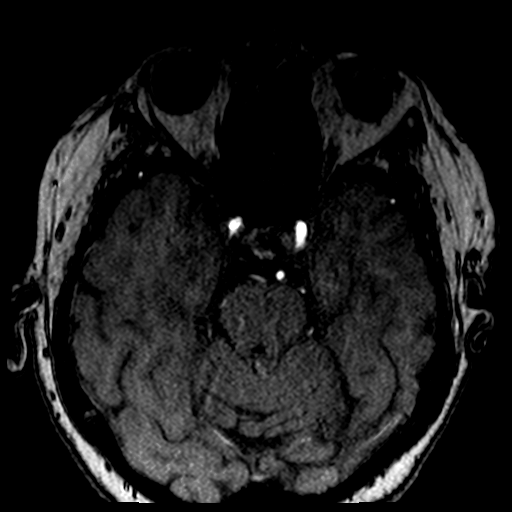
[im 110/191]
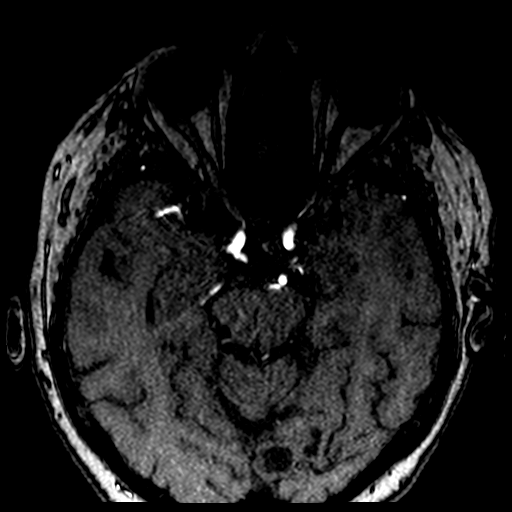
[im 134/191]
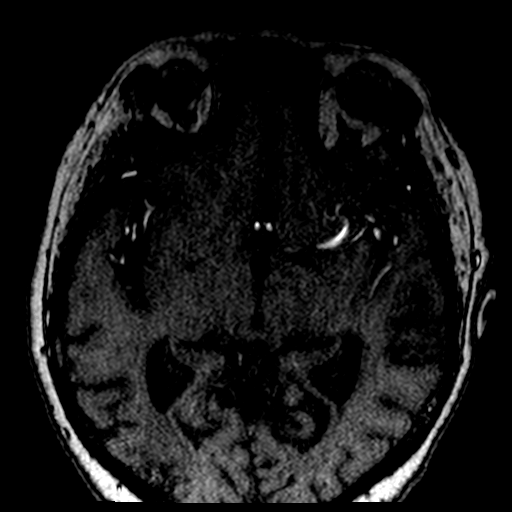
[im 158/191]
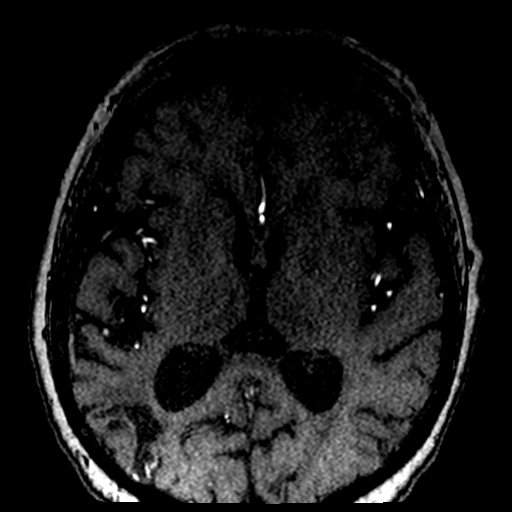
[im 162/191]
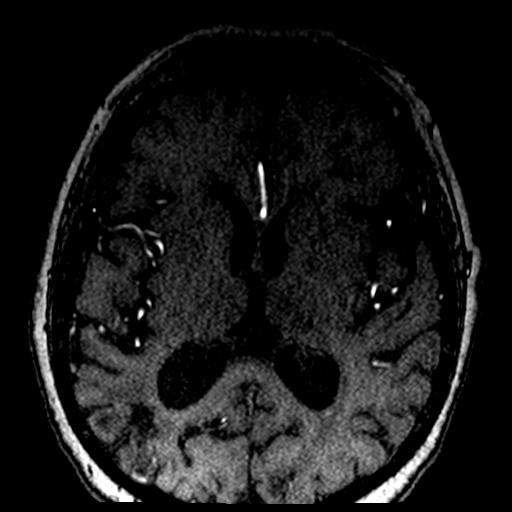
[im 182/191]
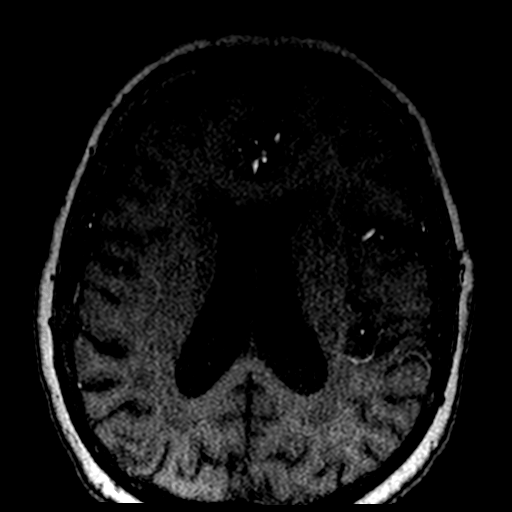

[19 of 48 positions shown; findings below may reference images not displayed]

FINDINGS: Anterior circulation: Both internal carotid arteries are patent to
the termini, without significant stenosis.

A1 segments patent. Normal anterior communicating artery. Anterior
cerebral arteries are patent to their distal aspects.

No M1 stenosis or occlusion. Normal MCA bifurcations. Distal MCA
branches perfused and symmetric.

Posterior circulation: The right vertebral artery is dominant and
patent to the vertebrobasilar junction. A diminutive left vertebral
artery demonstrates poor signal proximally but appears patent near
the vertebrobasilar junction, likely unchanged compared to
[DATE].

Basilar patent to its distal aspect. Superior cerebellar arteries
patent proximally.

Patent right P1 segment, with patent right posterior communicating
artery. The right PCA is patent to its distal aspect without focal
stenosis. Severe focal stenosis in the left P1 (series 2, image 125)
with minimal signal, but with a patent left posterior communicating
artery. The remainder of the left PCA demonstrates poor signal in
the P2 (series 2, images 116-122) and P3 segments (series 2, image
131); this appears to be more diminished flow than on the [DATE]
CTA but could be artifactual.

Anatomic variants: None significant

Other: None.
IMPRESSION: 1. Severe focal stenosis in the left P1 with a patent left posterior
communicating artery. Additional multifocal narrowing and poor
signal in the left PCA, which may reflect more diminished flow
compared to the [QS] CTA.
2. Diminutive left vertebral artery, as seen on the prior CTA, which
demonstrates poor signal proximally but appears to be patent near
the vertebrobasilar junction, in a right dominant vertebral system.
3. Patent anterior circulation.

## 2021-07-21 ENCOUNTER — Telehealth: Payer: Self-pay | Admitting: Family Medicine

## 2021-07-21 NOTE — Telephone Encounter (Signed)
Patient would like a call back from kela -  ?

## 2021-07-21 NOTE — Telephone Encounter (Addendum)
I advised pt to come into the office to see Dr [name] but she declined. States she is in too much pain to come in. She is also calling her ortho to see if he can do anything for her.  ? ? ?Patient ?Name: ?[name] Good ?WKS ?Gender: Female ?DOB: June 10, 1950 ?Age: 71 Y 1 M 14 D ?Return ?Phone ?Number: ?[id number] ?(Primary) ?Address: ?City/ ?State/ ?Zip: ?[place] ?[address] ?[facility] ?Clie ?[facility] ?Provider Jacquiline Doe- MD ?Contact Type Call ?Who Is Calling Patient / Member / Family / Caregiver ?Call Type Triage / Clinical ?Relationship To Patient Self ?Return Phone Number [phone] (Primary) ?Chief Complaint Prescription Refill or Medication Request (non ?symptomatic) ?Reason for Call Medication Question / Request ?Initial Comment Caller states the pain medication she was ?prescribed is not of stock in her area. She would ?like alternative medication called in for her pain. ?Translation No ?Nurse Assessment ?Nurse: Judith Part, RN, Santina Evans Date/Time (Eastern Time): 07/19/2021 9:06:16 PM ?Confirm and document reason for call. If ?symptomatic, describe symptoms. ?---caller states medication is on manufacture back ?order all over. didnt have the 5mg  either-percocet. ?been without medicine for 10 days. went to ortho the ?other day and her left knee if bone on bone. she was ?given a shot in the knee but its not helping. ?Does the patient have any new or worsening ?symptoms? ---Yes ?Will a triage be completed? ---Yes ?Related visit to physician within the last 2 weeks? ---Yes ?Does the PT have any chronic conditions? (i.e. ?diabetes, asthma, this includes High risk factors for ?pregnancy, etc.) ?---Yes ?Is this a behavioral health or substance abuse call? ---No ?Guidelines ?Guideline Title Affirmed Question Affirmed Notes Nurse Date/Time (Eastern ?Time) ?Knee Pain [1] MODERATE ?pain (e.g., interferes ?with normal ?activities,  limping) ?AND [2] present > 3 ?days ?Urcheck, RN, ? ?07/19/2021 9:09:07 ?PM ?Disp. Time (Eastern ?Time) Disposition Final User ?07/19/2021 8:56:41 PM Send To RN Personal 07/21/2021, RN, Gerre Pebbles ?07/19/2021 9:13:53 PM SEE PCP WITHIN 3 DAYS Yes Urcheck, RN, 07/21/2021 ?Caller Disagree/Comply Comply ?Caller Understands Yes ?PreDisposition InappropriateToAsk ?Care Advice Given Per Guideline ?SEE PCP WITHIN 3 DAYS: * You need to be seen within 2 or 3 days. CARE ADVICE given per Knee Pain (Adult) guideline. ?CALL BACK IF: * Fever or severe knee pain occurs * Redness or severe swelling occurs * You become worse PAIN MEDICINES: ?* For pain relief, you can take either acetaminophen, ibuprofen, or naproxen. REST YOUR KNEE FOR THE NEXT COUPLE ?DAYS: * Avoid activities that worsen your pain. ?Comments ?User: Santina Evans, RN Date/Time Darnelle Going Time): 07/19/2021 9:08:40 PM ?she is dizzy and off balance x7mos. had a brain scan today. ?User: 5mo, RN Date/Time Darnelle Going Time): 07/19/2021 9:10:05 PM ?took 800mg  ibuprofen twice today. ?User: 07/21/2021, RN Date/Time Time): 07/19/2021 9:10:22 PM ?pain now is 7/10 ?

## 2021-07-21 NOTE — Telephone Encounter (Signed)
See note

## 2021-07-22 ENCOUNTER — Telehealth: Payer: Self-pay | Admitting: Orthopaedic Surgery

## 2021-07-22 ENCOUNTER — Other Ambulatory Visit: Payer: Self-pay

## 2021-07-22 DIAGNOSIS — I679 Cerebrovascular disease, unspecified: Secondary | ICD-10-CM

## 2021-07-22 NOTE — Telephone Encounter (Signed)
Pt called and states the injection did not work in her left knee. She would like to know what else she can. She states " she needs something for her pain." ? ?CB [phone] ?

## 2021-07-22 NOTE — Telephone Encounter (Signed)
Her previous PCP was prescribing oxycodone. Does she need a refill on this? ? ?[name] Good. [name], MD ?07/22/2021 3:40 PM  ?

## 2021-07-22 NOTE — Telephone Encounter (Signed)
Please see lab note, returned pt call  ?

## 2021-07-22 NOTE — Progress Notes (Signed)
Please inform patient of the following: ? ?Her scan shows that she has decreased blood flow in the arteries in the back of her head and neck. I think this could explain some of her symptoms. Recommend referral to vascular surgery if she is agreeable. ? ?[name]. Jerline Pain, MD ?07/22/2021 8:24 AM  ?

## 2021-07-22 NOTE — Telephone Encounter (Signed)
Called pt and advised. She stated understanding. She will check with her pcp about pain medication. ?

## 2021-07-22 NOTE — Telephone Encounter (Signed)
Pt is saying yes and she is having a hard time finding it due to back order. She said if we can send it over to [facility] [address], [place], [place] [address]. They had 30 in stock yesterday.  ?

## 2021-07-22 NOTE — Telephone Encounter (Signed)
Pt states after reviewing lab results she called in the office at closing yesterday to advise she is in extreme pain and needs something to help. Called the Orthopedic and they stated she was not able to have anything called in, they only prescribe for patients having surgery and she is not a candidate for surgery due to dizziness and other issues she is having. Please advise ?

## 2021-07-23 NOTE — Telephone Encounter (Signed)
This has already been sent to Dr [name] and awaiting his approval.  ?

## 2021-07-23 NOTE — Telephone Encounter (Signed)
Patient called she needs a refill for this rx to be sent to pharmacy- [facility] [place] .-  ?

## 2021-07-24 MED ORDER — OXYCODONE-ACETAMINOPHEN 10-325 MG PO TABS: 1.0000 | ORAL_TABLET | Freq: Four times a day (QID) | ORAL | 0 refills | Status: DC | PRN

## 2021-07-28 DIAGNOSIS — G8929 Other chronic pain: Secondary | ICD-10-CM | POA: Diagnosis not present

## 2021-07-31 ENCOUNTER — Telehealth: Payer: Self-pay | Admitting: Family Medicine

## 2021-07-31 ENCOUNTER — Other Ambulatory Visit: Payer: Self-pay | Admitting: *Deleted

## 2021-07-31 DIAGNOSIS — G2581 Restless legs syndrome: Secondary | ICD-10-CM

## 2021-07-31 NOTE — Telephone Encounter (Signed)
Referral placed.

## 2021-07-31 NOTE — Telephone Encounter (Signed)
Please place referral. ? ?[name] Good. [name], MD ?07/31/2021 2:30 PM  ? ?

## 2021-07-31 NOTE — Telephone Encounter (Signed)
[name] Good with [facility] called to state Dr Julieanne Cotton suggests Interventional ?Radiology. They are closing the referral.  ?

## 2021-07-31 NOTE — Telephone Encounter (Signed)
FYI

## 2021-08-04 ENCOUNTER — Telehealth: Payer: Self-pay | Admitting: Family Medicine

## 2021-08-04 NOTE — Telephone Encounter (Signed)
Pt is requesting to be put back on a blood thinner. Please advise ?

## 2021-08-04 NOTE — Telephone Encounter (Signed)
Please advise 

## 2021-08-05 NOTE — Telephone Encounter (Signed)
Patient is taking 325mg  Aspirin daily, she was on Eliquis in the past and changed over to the Aspirin. Pt thinks dizziness is due to coming off blood thinner since it started when changing medications.  ?

## 2021-08-05 NOTE — Telephone Encounter (Signed)
Do not think stopping her Eliquis would cause any issues with dizziness.  Would recommend making any changes at this point.  We need to have her see interventional radiology soon-can we check on the status of this referral. ?

## 2021-08-05 NOTE — Telephone Encounter (Signed)
Can we clarify with patient? She is on aspirin 325mg  daily. ? ? . [name], MD ?08/05/2021 8:37 AM  ? ?

## 2021-08-06 ENCOUNTER — Other Ambulatory Visit: Payer: Self-pay

## 2021-08-06 DIAGNOSIS — I6509 Occlusion and stenosis of unspecified vertebral artery: Secondary | ICD-10-CM

## 2021-08-06 DIAGNOSIS — I679 Cerebrovascular disease, unspecified: Secondary | ICD-10-CM

## 2021-08-06 NOTE — Telephone Encounter (Signed)
New order placed and Misty Stanley advised so patient could be scheduled ?

## 2021-08-06 NOTE — Telephone Encounter (Signed)
Spoke w/ pt and advised Dr [name] recommendations, advised she should be hearing from interventional Radiology to schedule an appt soon.  ?

## 2021-08-11 ENCOUNTER — Other Ambulatory Visit (HOSPITAL_COMMUNITY): Payer: Self-pay | Admitting: Interventional Radiology

## 2021-08-11 ENCOUNTER — Telehealth (HOSPITAL_COMMUNITY): Payer: Self-pay

## 2021-08-11 DIAGNOSIS — I6509 Occlusion and stenosis of unspecified vertebral artery: Secondary | ICD-10-CM

## 2021-08-11 NOTE — Telephone Encounter (Signed)
Called to schedule consult, no answer, left vm. AW  

## 2021-08-14 ENCOUNTER — Other Ambulatory Visit: Payer: Self-pay | Admitting: Family Medicine

## 2021-08-14 MED ORDER — OXYCODONE-ACETAMINOPHEN 10-325 MG PO TABS: 1.0000 | ORAL_TABLET | Freq: Four times a day (QID) | ORAL | 0 refills | Status: DC | PRN

## 2021-08-14 NOTE — Telephone Encounter (Signed)
Patient needs a refill for oxyCODONE-acetaminophen (PERCOCET) 10-325 MG tablet ?Patient taking up to 5 a day- more then two a day - patient would like enough for a couple of months-  ?

## 2021-08-14 NOTE — Telephone Encounter (Signed)
I can send in #150 tablets but I cannot send in for more than 30 days at a time.  ?

## 2021-08-18 ENCOUNTER — Telehealth: Payer: Self-pay | Admitting: *Deleted

## 2021-08-18 ENCOUNTER — Telehealth: Payer: Self-pay | Admitting: Family Medicine

## 2021-08-18 NOTE — Telephone Encounter (Signed)
PA: Rx Available without authorization. ?

## 2021-08-18 NOTE — Telephone Encounter (Signed)
Key: [id number] - Rx #: [id number] ?Status ?Sent to Plan today ?Drug ?oxyCODONE-Acetaminophen 10-325MG  tablets ?Waiting for determination  ?

## 2021-08-18 NOTE — Telephone Encounter (Signed)
Patient requesting Rx Oxycodone refill, patient notified Dr Jerline Pain refills Rx today  ?Please call pharmacy for refills  ? ?

## 2021-08-18 NOTE — Telephone Encounter (Signed)
Please call ASAP about medication change. ?

## 2021-08-27 NOTE — Progress Notes (Signed)
? ?NEUROLOGY CONSULTATION NOTE ? ?[name] Good ?MRN: [id number] ?DOB: December 23, 1950 ? ?Referring provider: Ashok Cordia, OD ?Primary care provider: Jacquiline Doe, MD ? ?Reason for consult:  visual field deficit ? ?Assessment/Plan:  ? ?Dizziness - unclear etiology.  Potentially related to diminutive left vertebral artery but cannot tell for sure in absence of other lateralizing posterior circulation stroke symptoms.  She does have adequate blood flow to the circle of Willis via reconstitution of the the left vertebral artery as well as patent basilar and right vertebral artery.  There isn't anything to be done to resolve her dizziness.  She is already on maximal medical management for secondary stroke prevention.  Continue current management (ASA 81mg , statin, normotensive blood pressure, glycemic control).  Unfortunately, this may be residual chronic symptom.   ? ? ?Subjective:  ?[name] Good is a 71 year old female with CAD and history of stroke who presents for visual field deficit.  History supplemented by prior neurology and referring provider's notes.  She is accompanied by her daughter.   ? ?History of strokes. 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   ?  ?She had a left PCA territory stroke in November 2021 presenting with right sided vision loss.  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  TEE was negative. ? ?She has history of dizziness off and on since before her strokes. However, she has had more dizziness since this past Halloween.  She feels that it became worse after she was taken off of Eliquis.  Dizziness is described as a lightheadedness whenever she is  standing up and walking.  Lightheaded when she stands up and feels off-balance.  No spinning sensation, double vision or unilateral numbness or weakness.  She ambulates with a walker.  She also has arthritis in her knees.  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   ? ?Current medications:  ASA 81mg , atorvastatin 80mg  ? ?PAST MEDICAL HISTORY: ?Past Medical History:  ?Diagnosis Date  ? Anxiety   ? Arthritis   ? Depression   ? GERD (gastroesophageal reflux disease)   ? Myocardial infarction (HCC) 89  ? no visits to cardiac dr(thomas kelly) since 92  ? Stroke [facility])   ? Wound dehiscence   ? lumbar  ? ? ?PAST SURGICAL HISTORY: ?Past Surgical History:  ?Procedure Laterality Date  ? APPENDECTOMY    ? 17 yrs  ? BACK SURGERY    ? BREAST SURGERY Left   ? cyst  ? BUBBLE STUDY  03/12/2020  ? Procedure: BUBBLE STUDY;  Surgeon: , MD;  Location: [facility] END[facility];  Service: Cardiovascular;;  ? CARDIAC CATHETERIZATION  89  ? CHOLECYSTECTOMY    ? 71 yrs old  ? LUMBAR LAMINECTOMY/DECOMPRESSION MICRODISCECTOMY N/A 06/14/2015  ? Procedure: Lumbar Three-Four,Lumbar Four-Five, Lumbar Five-Sacral One Laminectomy;  Surgeon: Orpah Cobb, MD;  Location: [facility] NEURO ORS;  Service: Neurosurgery;  Laterality: N/A;  ?  LUMBAR WOUND DEBRIDEMENT N/A 07/24/2015  ? Procedure: lumbar wound revision;  Surgeon: Tia Alertavid S Jones, MD;  Location: [facility] NEURO ORS;  Service: Neurosurgery;  Laterality: N/A;  ? TEE WITHOUT CARDIOVERSION N/A 03/12/2020  ? Procedure: TRANSESOPHAGEAL ECHOCARDIOGRAM (TEE);  Surgeon: Orpah CobbKadakia, Ajay, MD;  [facility];  Service: Cardiovascular;  Laterality: N/A;  ? TOTAL HIP ARTHROPLASTY Right 10/25/2015  ?  Procedure: RIGHT TOTAL HIP ARTHROPLASTY ANTERIOR APPROACH;  Surgeon: Kathryne Hitchhristopher Y Blackman, MD;  Location: [facility] ORS;  Service: Orthopedics;  Laterality: Right;  ? TUBAL LIGATION    ? ? ?MEDICATIONS: ?Current Outpatient Medications on File Prior to Visit  ?Medication Sig Dispense Refill  ? atorvastatin (LIPITOR) 80 MG tablet Take 1 tablet (80 mg total) by mouth daily. (Patient not taking: Reported on 07/15/2021) 30 tablet 0  ? calcium carbonate (TUMS EX) 750 MG chewable tablet Chew 1 tablet by mouth 2 (two) times daily as needed for heartburn.    ? escitalopram (LEXAPRO) 20 MG tablet Take 20 mg by mouth daily.    ? famotidine (PEPCID) 20 MG tablet Take 20 mg by mouth daily as needed for heartburn or indigestion.    ? fluticasone (FLONASE) 50 MCG/ACT nasal spray Place into both nostrils.    ? oxyCODONE-acetaminophen (PERCOCET) 10-325 MG tablet Take 1-2 tablets by mouth every 6 (six) hours as needed for pain. Take up 1 tablet 150 tablet 0  ? phentermine (ADIPEX-P) 37.5 MG tablet TAKE 1 TABLET BY MOUTH DAILY FOR WEIGHT LOSS. LOSE THREE LBS EVERY MONTH. 30 tablet 0  ? rOPINIRole (REQUIP) 3 MG tablet Take 1 tablet (3 mg total) by mouth at bedtime as needed (restless leg). 30 tablet 0  ? ?No current facility-administered medications on file prior to visit.  ? ? ?ALLERGIES: ?Allergies  ?Allergen Reactions  ? Amoxicillin Other (See Comments)  ?  Tolerated Zosyn ?Has patient had a PCN reaction causing immediate rash, facial/tongue/throat swelling, SOB or lightheadedness with hypotension: No ?Has patient had a PCN reaction causing severe rash involving mucus membranes or skin necrosis: No ?Has patient had a PCN reaction that required hospitalization No ?Has patient had a PCN reaction occurring within the last 10 years: No ?If all of the above answers are "NO", then may proceed with Cephalosporin use.  ? ? ?FAMILY HISTORY: ?Family History  ?Problem Relation Age of Onset  ? Stroke Mother   ? Clotting disorder Neg Hx    ? ? ?Objective:  ?Blood pressure (!) 156/85, pulse 95, resp. rate 18, height 5\' 3"  (1.6 m), weight 212 lb (96.2 kg), SpO2 100 %. ?General: No acute distress.  Patient appears well-groomed.   ?Head:  Normocephalic/atraumatic ?Eyes:  fundi examined but not visualized ?Heart: regular rate and rhythm ?Vascular: No carotid bruits. ?Neurological Exam: ?Mental status: alert and oriented to person, place, and time, recent and remote memory intact, fund of knowledge intact, attention and concentration intact, speech fluent and not dysarthric, language intact. ?Cranial nerves: ?CN I: not tested ?CN II: pupils equal, round and reactive to light, visual fields appear intact without obvious homonymous hemianopsia on gross exam. ?CN III, IV, VI:  full range of motion, no nystagmus, no ptosis ?CN V: facial sensation intact. ?CN VII: upper and lower face symmetric ?CN VIII: hearing intact ?CN IX, X: gag intact, uvula midline ?CN XI: sternocleidomastoid and trapezius muscles intact ?CN XII: tongue midline ?Bulk & Tone: normal, no fasciculations. ?Motor:  muscle strength 5/5 throughout ?Sensation:  Pinprick sensation reduced in first toe on right and vibratory sensation reduced  in feet. ?Deep Tendon Reflexes:  2+ throughout,  toes downgoing.   ?Finger to nose testing:  Without dysmetria.   ?Heel to shin:  Without dysmetria.   ?Gait:  Broad-based, cautious, unsteady.  Uses walker.  Romberg with sway. ? ? ? ?Thank you for allowing me to take part in the care of this patient. ? ?Shon Millet, DO ? ?CC:  Jacquiline Doe, MD ? Ashok Cordia, OD ? ? ? ? ?

## 2021-08-28 ENCOUNTER — Ambulatory Visit: Payer: Medicare HMO | Admitting: Neurology

## 2021-08-28 ENCOUNTER — Encounter: Payer: Self-pay | Admitting: Neurology

## 2021-08-28 VITALS — BP 156/85 | HR 95 | Resp 18 | Ht 63.0 in | Wt 212.0 lb

## 2021-08-28 DIAGNOSIS — R42 Dizziness and giddiness: Secondary | ICD-10-CM | POA: Diagnosis not present

## 2021-08-28 DIAGNOSIS — Z8673 Personal history of transient ischemic attack (TIA), and cerebral infarction without residual deficits: Secondary | ICD-10-CM | POA: Diagnosis not present

## 2021-08-28 DIAGNOSIS — I6502 Occlusion and stenosis of left vertebral artery: Secondary | ICD-10-CM

## 2021-08-28 NOTE — Patient Instructions (Signed)
Unfortunately, there isn't any medication to take that will resolve the dizziness.  You are already on the appropriate medication for secondary stroke prevention.   ?

## 2021-09-01 ENCOUNTER — Telehealth: Payer: Self-pay | Admitting: Family Medicine

## 2021-09-01 NOTE — Telephone Encounter (Signed)
Can we please have her schedule at least a virtual visit? ? ?[name] Good. [name], MD ?09/01/2021 3:09 PM  ? ?

## 2021-09-01 NOTE — Telephone Encounter (Signed)
Called patient, patient stated she is feeling better- patient stated she knows she has shingles- patient stated she cannot come in - patient stated if dr [name] can send her an ointment or a cream to help. - patient stated she can do a vv apt however she rather ask dr [name] first if he can send that over.  ? ? ?patient ?Name: ?[name] Good ?WKS ?Gender: Female ?DOB: [birth year] ?Age: 71 Y 2 M 24 D ?Return ?Phone ?Number: ?[id number] ?(Primary) ?Address: ?City/ ?State/ ?Zip: ?[place] ?[address] ?[facility] - ?Clie ?Health visitor at [facility] [employer] ?Provider Jacquiline Doe- MD ?Contact Type Call ?Who Is Calling Patient / Member / Family / Caregiver ?Call Type Triage / Clinical ?Relationship To Patient Self ?Return Phone Number [phone] (Primary) ?Chief Complaint Arm Pain (no known cause) ?Reason for Call Symptomatic / Request for Health Information ?Initial Comment Caller states she has a burning spot on the inside of ?her arm. She thinks it is shingles. She is requesting ?medication. ?Translation No ?Nurse Assessment ?Nurse: Henri Medal, RN, Amy Date/Time Lamount Cohen Time): 08/30/2021 10:43:26 AM ?Confirm and document reason for call. If ?symptomatic, describe symptoms. ?---Caller states she has a burning spot on the inside ?of her left arm. It is right in the crease of the elbow ?& sends shooting pain down her arm. There is a lot ?of blisters in the area. She thinks it is shingles. It ?started 1-2 days ago. She at first thought it might be ?poison ivy, but she has not been outdoors. No other ?symptoms. She is requesting medication. ?Does the patient have any new or worsening ?symptoms? ---Yes ?Will a triage be completed? ---Yes ?Related visit to physician within the last 2 weeks? ---No ?Does the PT have any chronic conditions? (i.e. ?diabetes, asthma, this includes High risk factors for ?pregnancy, etc.) ?---Yes ?List chronic conditions. ---severe OA ?Is this a  behavioral health or substance abuse call? ---No ?Guidelines ?Guideline Title Affirmed Question Affirmed Notes Nurse Date/Time (Eastern ?Time) ?Shingles (Zoster) [1] Shingles ?rash (matches ?SYMPTOMS) AND ?[2] weak immune ?Lovelace, RN, Amy 08/30/2021 10:47:10 ?AM ?PLEASE NOTE: All timestamps contained within this report are represented as [place] Standard Time. ?CONFIDENTIALTY NOTICE: This fax transmission is intended only for the addressee. It contains information that is legally privileged, confidential or ?otherwise protected from use or disclosure. If you are not the intended recipient, you are strictly prohibited from reviewing, disclosing, copying using ?or disseminating any of this information or taking any action in reliance on or regarding this information. If you have received this fax in error, please ?notify us immediately by telephone so that we can arrange for its return to [place]. Phone: [phone], Toll-Free: [phone], Fax: [phone] ?Page: 2 of 2 ?Call Id: [id number] ?Guidelines ?Guideline Title Affirmed Question Affirmed Notes Nurse Date/Time (Eastern ?Time) ?system (e.g., HIV ?positive, cancer ?chemotherapy, ?chronic steroid ?treatment, ?splenectomy) AND ?[3] NOT taking ?antiviral medication ?Disp. Time (Eastern ?Time) Disposition Final User ?08/30/2021 10:49:57 AM See HCP within 4 Hours (or ?PCP triage) ?Yes Lovelace, RN, Amy ?Caller Disagree/Comply Disagree ?Caller Understands Yes ?PreDisposition InappropriateToAsk ?Care Advice Given Per Guideline ?SEE HCP (OR PCP TRIAGE) WITHIN 4 HOURS: * IF OFFICE WILL BE CLOSED AND NO PCP (PRIMARY CARE ?PROVIDER) SECOND-LEVEL TRIAGE: You need to be seen within the next 3 or 4 hours. A nearby Urgent Care Center [facility]) ?is often a good source of care. Another choice is to go to the ED. Go sooner if you become worse. CALL BACK  IF: * You become ?worse CARE ADVICE given per Shingles (Adult) guideline. ?Comments ?User: Lutricia Horsfall, RN Date/Time Lamount Cohen Time):  08/30/2021 10:52:20 AM ?Advised doing a telehealth visit, because pt. stated she cannot get out to [facility]. Advised otherwise she will have to ?wait until the office opens on Monday. ?Referrals ?GO TO FACILITY REFUSED ?

## 2021-09-01 NOTE — Telephone Encounter (Signed)
Pt has shingles and requesting an ointment before scheduling VV and declined office visit ?

## 2021-09-01 NOTE — Telephone Encounter (Signed)
Pt scheduled for VV for tomorrow morning  ?

## 2021-09-02 ENCOUNTER — Encounter: Payer: Self-pay | Admitting: Family Medicine

## 2021-09-02 ENCOUNTER — Telehealth (INDEPENDENT_AMBULATORY_CARE_PROVIDER_SITE_OTHER): Payer: Medicare HMO | Admitting: Family Medicine

## 2021-09-02 MED ORDER — PHENTERMINE HCL 37.5 MG PO TABS
ORAL_TABLET | ORAL | 0 refills | Status: DC
Start: 2021-09-02 — End: 2021-10-30

## 2021-09-02 MED ORDER — VALACYCLOVIR HCL 1 G PO TABS: 1000.0000 mg | ORAL_TABLET | Freq: Three times a day (TID) | ORAL | 0 refills | Status: AC

## 2021-09-02 NOTE — Progress Notes (Signed)
? ?  [name] Good is a 71 y.o. female who presents today for a telephone visit. ? ?Assessment/Plan:  ?New/Acute Problems: ?Rash ?Discussed limitations of telephone visit.  Patient was not able to get her virtual visit to work this morning.  History does sound consistent with shingles outbreak however cannot rule out contact dermatitis or other etiologies.  We discussed empiric treatment with Valtrex given that she is still having ongoing lesions.  She was agreeable to this.  We will start Valtrex 1000 mg 3 times daily for a week.  She will need to come in for an in person office visit if not improving. ? ?Chronic Problems Addressed Today: ?Morbid obesity (HCC) ?Patient on phentermine as previously prescribed by her previous PCP.  She typically does well with this without any significant side effects.  Helps manage her appetite.  We will refill today. Will avoid prescribing for longer than 12-week course.  Could consider trial of GLP-1 agonist in the future.  ? ? ?  ?Subjective:  ?HPI: ? ?Patient here with concern for shingles. Symptoms started about a week ago. Located on her right arm. Progressed to involve part of her upper arm. She is having a lot of blisters. She is having some pain and burning. Tried using lidocaine cream which helped.  No obvious exposures. ? ?   ?  ?Objective/Observations  ? ?NAD ? ?Telephone Visit  ? ?I connected with Octaviano Glow on 09/02/21 at 10:40 AM EDT via telephone and verified that I am speaking with the correct person using two identifiers. I discussed the limitations of evaluation and management by telemedicine and the availability of in person appointments. The patient expressed understanding and agreed to proceed.  ? ?Patient location: Home ?Provider location: [facility] [employer] [facility] ?Persons participating in the virtual visit: Myself and Patient  ? ?A total of 10 minutes were spent on medical discussion.  ?   ? ?[name]. [name], MD ?09/02/2021 11:08 AM  ? ?

## 2021-09-02 NOTE — Assessment & Plan Note (Signed)
Patient on phentermine as previously prescribed by her previous PCP.  She typically does well with this without any significant side effects.  Helps manage her appetite.  We will refill today. Will avoid prescribing for longer than 12-week course.  Could consider trial of GLP-1 agonist in the future.  ?

## 2021-09-08 ENCOUNTER — Other Ambulatory Visit: Payer: Self-pay | Admitting: Family Medicine

## 2021-09-11 ENCOUNTER — Telehealth: Payer: Self-pay

## 2021-09-11 MED ORDER — OXYCODONE-ACETAMINOPHEN 10-325 MG PO TABS: 1.0000 | ORAL_TABLET | Freq: Four times a day (QID) | ORAL | 0 refills | Status: AC | PRN

## 2021-09-11 NOTE — Telephone Encounter (Signed)
Pt called in regards to getting her Oxycodone refilled before the weekend. Advised it wasn't due to be filled until 09/13/21. Please advise

## 2021-09-11 NOTE — Telephone Encounter (Signed)
Rx sent in.  [name] Good. [name], MD 09/11/2021 12:16 PM

## 2021-09-22 ENCOUNTER — Other Ambulatory Visit: Payer: Self-pay | Admitting: Family Medicine

## 2021-09-23 ENCOUNTER — Telehealth: Payer: Self-pay | Admitting: Family Medicine

## 2021-09-23 ENCOUNTER — Other Ambulatory Visit: Payer: Self-pay | Admitting: Family Medicine

## 2021-09-23 DIAGNOSIS — E6609 Other obesity due to excess calories: Secondary | ICD-10-CM | POA: Diagnosis not present

## 2021-09-23 DIAGNOSIS — I2510 Atherosclerotic heart disease of native coronary artery without angina pectoris: Secondary | ICD-10-CM | POA: Diagnosis not present

## 2021-09-23 DIAGNOSIS — I10 Essential (primary) hypertension: Secondary | ICD-10-CM | POA: Diagnosis not present

## 2021-09-23 DIAGNOSIS — I69354 Hemiplegia and hemiparesis following cerebral infarction affecting left non-dominant side: Secondary | ICD-10-CM | POA: Diagnosis not present

## 2021-09-23 NOTE — Telephone Encounter (Signed)
Rx was send in  

## 2021-09-23 NOTE — Telephone Encounter (Signed)
Medication not in medication list  Please advise

## 2021-09-23 NOTE — Telephone Encounter (Signed)
Not on patient medication history

## 2021-09-23 NOTE — Telephone Encounter (Signed)
Ok with me. Please place any necessary orders. 

## 2021-09-23 NOTE — Telephone Encounter (Signed)
Pt states she still has some shingles spots and is asking if she should still be taking the Valtrex. She is out at this time. Please advise  MEDICATION: valACYclovir (VALTREX) 1000 MG tablet  PHARMACY: [facility] - [place], [place] - [address] Phone:  [phone]  Fax:  [phone]

## 2021-09-24 ENCOUNTER — Ambulatory Visit: Payer: Medicare Other | Admitting: Neurology

## 2021-10-06 ENCOUNTER — Telehealth: Payer: Self-pay | Admitting: Family Medicine

## 2021-10-06 NOTE — Telephone Encounter (Signed)
Ok to refill Rx 

## 2021-10-06 NOTE — Telephone Encounter (Signed)
Pt states this was discussed with PCP at previous appointment. - Taken for management of arthritis      LAST APPOINTMENT DATE:   07/15/21  NEXT APPOINTMENT DATE: 10/23/21  MEDICATION: predniSONE (DELTASONE) 5 MG tablet [[id number]]    Is the patient out of medication?  States she has 4 left  PHARMACY: [facility] - [place], [place] - [address]  [address] [place] [address]  Phone:  [phone]  Fax:  [phone]

## 2021-10-10 NOTE — Telephone Encounter (Signed)
Patient states script was previously filled by Dr. Aida Puffer (previous PCP).    Also is requesting refill on Percocet.

## 2021-10-11 ENCOUNTER — Other Ambulatory Visit: Payer: Self-pay | Admitting: Family Medicine

## 2021-10-13 ENCOUNTER — Other Ambulatory Visit: Payer: Self-pay | Admitting: *Deleted

## 2021-10-13 MED ORDER — PREDNISONE 5 MG PO TABS: 5.0000 mg | ORAL_TABLET | Freq: Every day | ORAL | 0 refills | Status: DC

## 2021-10-13 NOTE — Telephone Encounter (Signed)
Patient states script was previously filled by Dr. Aida Puffer (previous PCP).

## 2021-10-13 NOTE — Telephone Encounter (Signed)
Rx send to the pharmacy  

## 2021-10-13 NOTE — Telephone Encounter (Signed)
Spoke with patient stated last OV with Dr Clarene Duke was on 03/16/202. Dr little retired on April 2023 due to his retirement patient transfer to DR [name]. On her last OV did not need refills since previews PCP had refill Rx Prednisone  Patient stated this prescription is the only thing it work for her for inflation

## 2021-10-14 ENCOUNTER — Other Ambulatory Visit: Payer: Self-pay | Admitting: Physician Assistant

## 2021-10-14 ENCOUNTER — Other Ambulatory Visit: Payer: Self-pay | Admitting: *Deleted

## 2021-10-14 MED ORDER — OXYCODONE-ACETAMINOPHEN 10-325 MG PO TABS: 1.0000 | ORAL_TABLET | Freq: Four times a day (QID) | ORAL | 0 refills | Status: DC | PRN

## 2021-10-14 NOTE — Telephone Encounter (Addendum)
Pt called back regarding the refill for Oxycodone, she stated that she has been taking this for Arthritis, she stated that Dr. [name] send her a refill on 5/18 and she is taking it daily for her pain. She scheduled an OV for 7/6 to follow up with Dr. [name] when he is back in the office, but she asked if another provider can send her a refill in the meanwhile. Pt asked to call her back and inform her when Rx is sent, she has to arrange for transportation to the pharmacy. Sending this message to [facility].

## 2021-10-14 NOTE — Telephone Encounter (Signed)
Pt.notified

## 2021-10-15 ENCOUNTER — Ambulatory Visit: Payer: Medicare HMO | Admitting: Family Medicine

## 2021-10-23 ENCOUNTER — Ambulatory Visit: Payer: Medicare HMO | Admitting: Family Medicine

## 2021-10-23 ENCOUNTER — Ambulatory Visit (INDEPENDENT_AMBULATORY_CARE_PROVIDER_SITE_OTHER): Payer: Medicare HMO

## 2021-10-23 DIAGNOSIS — Z742 Need for assistance at home and no other household member able to render care: Secondary | ICD-10-CM | POA: Diagnosis not present

## 2021-10-23 DIAGNOSIS — Z0000 Encounter for general adult medical examination without abnormal findings: Secondary | ICD-10-CM | POA: Diagnosis not present

## 2021-10-23 DIAGNOSIS — Z748 Other problems related to care provider dependency: Secondary | ICD-10-CM | POA: Diagnosis not present

## 2021-10-23 NOTE — Addendum Note (Signed)
Addended by: Marzella Schlein on: 10/23/2021 11:37 AM   Modules accepted: Orders

## 2021-10-23 NOTE — Progress Notes (Addendum)
Virtual Visit via Telephone Note  I connected with  [name] Good on 10/23/21 at 11:00 AM EDT by telephone and verified that I am speaking with the correct person using two identifiers.  Medicare Annual Wellness visit completed telephonically due to Covid-19 pandemic.   Persons participating in this call: This Health Coach and this patient.   Location: Patient: home Provider: office   I discussed the limitations, risks, security and privacy concerns of performing an evaluation and management service by telephone and the availability of in person appointments. The patient expressed understanding and agreed to proceed.  Unable to perform video visit due to video visit attempted and failed and/or patient does not have video capability.   Some vital signs may be absent or patient reported.   Marzella Schlein, LPN   Subjective:   [name] Good is a 71 y.o. female who presents for an Initial Medicare Annual Wellness Visit.  Review of Systems     Cardiac Risk Factors include: advanced age (>55men, >42 women);dyslipidemia;hypertension;obesity (BMI >30kg/m2);sedentary lifestyle     Objective:    There were no vitals filed for this visit. There is no height or weight on file to calculate BMI.     10/23/2021   11:04 AM 08/28/2021   10:42 AM 04/29/2020    1:02 PM 03/12/2020    7:09 AM 03/08/2020    4:44 PM 10/05/2019    3:31 PM 10/01/2019    8:58 PM  Advanced Directives  Does Patient Have a Medical Advance Directive? No Yes Yes No No No No  Type of Best boy of [place];Living will      Does patient want to make changes to medical advance directive?   No - Patient declined      Copy of Healthcare Power of Attorney in Chart?   No - copy requested      Would patient like information on creating a medical advance directive? Yes (MAU/Ambulatory/Procedural Areas - Information given)   No - Patient declined No - Guardian declined No - Patient declined No - Patient  declined    Current Medications (verified) Outpatient Encounter Medications as of 10/23/2021  Medication Sig   aspirin EC 81 MG tablet Take 81 mg by mouth daily. Swallow whole.   atorvastatin (LIPITOR) 80 MG tablet Take 1 tablet (80 mg total) by mouth daily.   calcium carbonate (TUMS EX) 750 MG chewable tablet Chew 1 tablet by mouth 2 (two) times daily as needed for heartburn.   docusate sodium (COLACE) 100 MG capsule Take 100 mg by mouth 2 (two) times daily.   escitalopram (LEXAPRO) 20 MG tablet Take 20 mg by mouth daily.   famotidine (PEPCID) 20 MG tablet Take 20 mg by mouth daily as needed for heartburn or indigestion.   fluticasone (FLONASE) 50 MCG/ACT nasal spray Place into both nostrils.   loratadine (CLARITIN) 10 MG tablet Take 10 mg by mouth daily.   oxyCODONE-acetaminophen (PERCOCET) 10-325 MG tablet Take 1 tablet by mouth every 6 (six) hours as needed for pain.   phentermine (ADIPEX-P) 37.5 MG tablet TAKE 1 TABLET BY MOUTH DAILY FOR WEIGHT LOSS. LOSE THREE LBS EVERY MONTH.   predniSONE (DELTASONE) 5 MG tablet Take 1 tablet (5 mg total) by mouth daily.   rOPINIRole (REQUIP) 3 MG tablet Take 1 tablet (3 mg total) by mouth at bedtime as needed (restless leg).   valACYclovir (VALTREX) 1000 MG tablet TAKE 1 TABLET BY MOUTH THREE TIMES DAILY   No facility-administered  encounter medications on file as of 10/23/2021.    Allergies (verified) Amoxicillin   History: Past Medical History:  Diagnosis Date   Anxiety    Arthritis    Depression    GERD (gastroesophageal reflux disease)    Myocardial infarction (HCC) 89   no visits to cardiac dr(thomas kelly) since 89   Stroke (HCC)    Wound dehiscence    lumbar   Past Surgical History:  Procedure Laterality Date   APPENDECTOMY     17 yrs   BACK SURGERY     BREAST SURGERY Left    cyst   BUBBLE STUDY  03/12/2020   Procedure: BUBBLE STUDY;  Surgeon: Orpah CobbKadakia, Ajay, MD;  Location: [facility];  Service: Cardiovascular;;   CARDIAC  CATHETERIZATION  5691   CHOLECYSTECTOMY     71 yrs old   LUMBAR LAMINECTOMY/DECOMPRESSION MICRODISCECTOMY N/A 06/14/2015   Procedure: Lumbar Three-Four,Lumbar Four-Five, Lumbar Five-Sacral One Laminectomy;  Surgeon: Tia Alertavid S Jones, MD;  Location: [facility]UR[facility];  Service: Neurosurgery;  Laterality: N/A;   LUMBAR WOUND DEBRIDEMENT N/A 07/24/2015   Procedure: lumbar wound revision;  Surgeon: Tia Alertavid S Jones, MD;  Location: [facility];  Service: Neurosurgery;  Laterality: N/A;   TEE WITHOUT CARDIOVERSION N/A 03/12/2020   Procedure: TRANSESOPHAGEAL ECHOCARDIOGRAM (TEE);  Surgeon: Orpah CobbKadakia, Ajay, MD;  [facility];  Service: Cardiovascular;  Laterality: N/A;   TOTAL HIP ARTHROPLASTY Right 10/25/2015   Procedure: RIGHT TOTAL HIP ARTHROPLASTY ANTERIOR APPROACH;  Surgeon: Kathryne Hitchhristopher Y Blackman, MD;  Location: [facility] ORS;  Service: Orthopedics;  Laterality: Right;   TUBAL LIGATION     Family History  Problem Relation Age of Onset   Stroke Mother    Clotting disorder Neg Hx    Social History   Socioeconomic History   Marital status: Married    Spouse name: Antony OdeaJerry Renault   Number of children: 4   Years of education: GED   Highest education level: GED or equivalent  Occupational History   Occupation: retired   Tobacco Use   Smoking status: Former    Packs/day: 2.00    Years: 40.00    Total pack years: 80.00    Types: Cigarettes    Quit date: 04/10/2013    Years since quitting: 8.5   Smokeless tobacco: Never  Vaping Use   Vaping Use: Former   Start date: 04/11/2013   Substances: Nicotine, Nicotine-salt  Substance and Sexual Activity   Alcohol use: Not Currently    Comment: rarely   Drug use: No   Sexual activity: Not Currently  Other Topics Concern   Not on file  Social History Narrative   Lives with husband   Right Handed   Drinks1-2 cups caffeine daily   Social Determinants of Health   Financial Resource Strain: Low Risk  (10/23/2021)   Overall Financial Resource Strain  (CARDIA)    Difficulty of Paying Living Expenses: Not hard at all  Food Insecurity: No Food Insecurity (10/23/2021)   Hunger Vital Sign    Worried About Running Out of Food in the Last Year: Never true    Ran Out of Food in the Last Year: Never true  Transportation Needs: No Transportation Needs (10/23/2021)   PRAPARE - Administrator, Civil ServiceTransportation    Lack of Transportation (Medical): No    Lack of Transportation (Non-Medical): No  Physical Activity: Inactive (10/23/2021)   Exercise Vital Sign    Days of Exercise per Week: 0 days    Minutes of Exercise per Session: 0 min  Stress: Stress  Concern Present (10/23/2021)   [employer] of Occupational Health - Occupational Stress Questionnaire    Feeling of Stress : To some extent  Social Connections: Moderately Isolated (10/23/2021)   Social Connection and Isolation Panel [NHANES]    Frequency of Communication with Friends and Family: More than three times a week    Frequency of Social Gatherings with Friends and Family: Once a week    Attends Religious Services: Never    Database administrator or Organizations: No    Attends Engineer, structural: Never    Marital Status: Married    Tobacco Counseling Counseling given: Not Answered   Clinical Intake:  Pre-visit preparation completed: Yes  Pain : No/denies pain     BMI - recorded: 37.56 Nutritional Status: BMI > 30  Obese Nutritional Risks: None Diabetes: No  How often do you need to have someone help you when you read instructions, pamphlets, or other written materials from your doctor or pharmacy?: 1 - Never  Diabetic?no  Interpreter Needed?: No  Information entered by :: Inetta Fermo Khaiden Segreto,LPN   Activities of Daily Living    10/23/2021   11:05 AM  In your present state of health, do you have any difficulty performing the following activities:  Hearing? 0  Vision? 0  Difficulty concentrating or making decisions? 0  Walking or climbing stairs? 0  Dressing or bathing? 0   Doing errands, shopping? 0  Preparing Food and eating ? N  Using the Toilet? N  In the past six months, have you accidently leaked urine? Y  Comment urgency  Do you have problems with loss of bowel control? N  Managing your Medications? N  Managing your Finances? N  Housekeeping or managing your Housekeeping? N    Patient Care Team: Ardith Dark, MD as PCP - General (Family Medicine)  Indicate any recent Medical Services you may have received from other than [facility] providers in the past year (date may be approximate).     Assessment:   This is a routine wellness examination for Ma[place].  Hearing/Vision screen Hearing Screening - Comments:: Pt denies any hearing issues  Vision Screening - Comments:: Pt follows up with [facility]  no Dietary issues and exercise activities discussed: Current Exercise Habits: The patient does not participate in regular exercise at present   Goals Addressed             This Visit's Progress    Patient Stated       Patient Stated       Lose weight        Depression Screen    10/23/2021   10:59 AM 07/15/2021    2:06 PM 07/15/2021    1:26 PM 05/06/2020    3:36 PM 04/29/2020   12:38 PM 04/18/2020   11:58 AM 12/05/2019   11:21 AM  PHQ 2/9 Scores  PHQ - 2 Score 2 3 1 3 4 4 6   PHQ- 9 Score 9 18  9 8 8      Fall Risk    10/23/2021   11:04 AM 08/28/2021   10:42 AM 07/15/2021    1:26 PM 04/29/2020    1:07 PM 12/05/2019   11:20 AM  Fall Risk   Falls in the past year? 0 1 0 1 0  Number falls in past yr: 0 0 0 0   Injury with Fall? 0 0 0 1   Risk for fall due to : Impaired balance/gait;Impaired mobility;Impaired vision  Impaired mobility History  of fall(s);Impaired balance/gait;Impaired mobility;Orthopedic patient;Impaired vision   Follow up Falls prevention discussed  Falls evaluation completed Education provided;Falls prevention discussed     FALL RISK PREVENTION PERTAINING TO THE HOME:  Any stairs in or around the home? Yes  If  so, are there any without handrails? No  Home free of loose throw rugs in walkways, pet beds, electrical cords, etc? Yes  Adequate lighting in your home to reduce risk of falls? Yes   ASSISTIVE DEVICES UTILIZED TO PREVENT FALLS:  Life alert? No  Use of a cane, walker or w/c? Yes  Grab bars in the bathroom? Yes  Shower chair or bench in shower? Yes  Elevated toilet seat or a handicapped toilet? No   TIMED UP AND GO:  Was the test performed? No .   Cognitive Function:        10/23/2021   11:05 AM  6CIT Screen  What Year? 0 points  What month? 0 points  What time? 0 points  Count back from 20 0 points  Months in reverse 0 points  Repeat phrase 0 points  Total Score 0 points    Immunizations Immunization History  Administered Date(s) Administered   PNEUMOCOCCAL CONJUGATE-20 07/15/2021    TDAP status: Due, Education has been provided regarding the importance of this vaccine. Advised may receive this vaccine at local pharmacy or Health Dept. Aware to provide a copy of the vaccination record if obtained from local pharmacy or Health Dept. Verbalized acceptance and understanding.  Flu Vaccine status: Declined, Education has been provided regarding the importance of this vaccine but patient still declined. Advised may receive this vaccine at local pharmacy or Health Dept. Aware to provide a copy of the vaccination record if obtained from local pharmacy or Health Dept. Verbalized acceptance and understanding.  Pneumococcal vaccine status: Up to date  Covid-19 vaccine status: Completed vaccines pt stated will bring in at next visit   Qualifies for Shingles Vaccine? Yes   Zostavax completed No   Shingrix Completed?: No.    Education has been provided regarding the importance of this vaccine. Patient has been advised to call insurance company to determine out of pocket expense if they have not yet received this vaccine. Advised may also receive vaccine at local pharmacy or Health  Dept. Verbalized acceptance and understanding.  Screening Tests Health Maintenance  Topic Date Due   COVID-19 Vaccine (1) Never done   Zoster Vaccines- Shingrix (1 of 2) Never done   COLONOSCOPY (Pts 45-67yrs Insurance coverage will need to be confirmed)  Never done   DEXA SCAN  Never done   MAMMOGRAM  07/16/2022 (Originally 06/09/1999)   TETANUS/TDAP  07/16/2022 (Originally [birth year]/1968)   INFLUENZA VACCINE  11/25/2021   Pneumonia Vaccine 72+ Years old  Completed   Hepatitis C Screening  Completed   HPV VACCINES  Aged Out    Health Maintenance  Health Maintenance Due  Topic Date Due   COVID-19 Vaccine (1) Never done   Zoster Vaccines- Shingrix (1 of 2) Never done   COLONOSCOPY (Pts 45-7yrs Insurance coverage will need to be confirmed)  Never done   DEXA SCAN  Never done      Pt wants to discuss with dr [name] at appt for colonoscopy, mammagram and bone scan      Additional Screening:  Hepatitis C Screening: Completed 07/17/16  Vision Screening: Recommended annual ophthalmology exams for early detection of glaucoma and other disorders of the eye. Is the patient up to date with their annual eye  exam?  Yes  Who is the provider or what is the name of the office in which the patient attends annual eye exams?  eye center  If pt is not established with a provider, would they like to be referred to a provider to establish care? No .   Dental Screening: Recommended annual dental exams for proper oral hygiene  Community Resource Referral / Chronic Care Management: CRR required this visit?  Yes   CCM required this visit?  No      Plan:     I have personally reviewed and noted the following in the patient's chart:   Medical and social history Use of alcohol, tobacco or illicit drugs  Current medications and supplements including opioid prescriptions. Patient is currently taking opioid prescriptions. Information provided to patient regarding non-opioid alternatives.  Patient advised to discuss non-opioid treatment plan with their provider. Functional ability and status Nutritional status Physical activity Advanced directives List of other physicians Hospitalizations, surgeries, and ER visits in previous 12 months Vitals Screenings to include cognitive, depression, and falls Referrals and appointments  In addition, I have reviewed and discussed with patient certain preventive protocols, quality metrics, and best practice recommendations. A written personalized care plan for preventive services as well as general preventive health recommendations were provided to patient.     Marzella Schlein, LPN   [id number]/[id number]   Nurse Notes: Pt wants to discuss urgency at visit on 10/30/21 , stating she has been leaking urine before she can get to the bathroom please advise

## 2021-10-23 NOTE — Patient Instructions (Signed)
[name] Good , Thank you for taking time to come for your Medicare Wellness Visit. I appreciate your ongoing commitment to your health goals. Please review the following plan we discussed and let me know if I can assist you in the future.   Screening recommendations/referrals: Colonoscopy: will discuss at appt  Mammogram: will discuss at appt  Bone Density: will discuss at appt  Recommended yearly ophthalmology/optometry visit for glaucoma screening and checkup Recommended yearly dental visit for hygiene and checkup  Vaccinations: Influenza vaccine: declined  Pneumococcal vaccine: Up to date Tdap vaccine: due  Shingles vaccine: Shingrix discussed. Please contact your pharmacy for coverage information.    Covid-19:pt will bring in dates of completion  Advanced directives: Advance directive discussed with you today. I have provided a copy for you to complete at home and have notarized. Once this is complete please bring a copy in to our office so we can scan it into your chart.  Conditions/risks identified: lose weight   Next appointment: Follow up in one year for your annual wellness visit    Preventive Care 65 Years and Older, Female Preventive care refers to lifestyle choices and visits with your health care provider that can promote health and wellness. What does preventive care include? A yearly physical exam. This is also called an annual well check. Dental exams once or twice a year. Routine eye exams. Ask your health care provider how often you should have your eyes checked. Personal lifestyle choices, including: Daily care of your teeth and gums. Regular physical activity. Eating a healthy diet. Avoiding tobacco and drug use. Limiting alcohol use. Practicing safe sex. Taking low-dose aspirin every day. Taking vitamin and mineral supplements as recommended by your health care provider. What happens during an annual well check? The services and screenings done by your health  care provider during your annual well check will depend on your age, overall health, lifestyle risk factors, and family history of disease. Counseling  Your health care provider may ask you questions about your: Alcohol use. Tobacco use. Drug use. Emotional well-being. Home and relationship well-being. Sexual activity. Eating habits. History of falls. Memory and ability to understand (cognition). Work and work Astronomer. Reproductive health. Screening  You may have the following tests or measurements: Height, weight, and BMI. Blood pressure. Lipid and cholesterol levels. These may be checked every 5 years, or more frequently if you are over 82 years old. Skin check. Lung cancer screening. You may have this screening every year starting at age 41 if you have a 30-pack-year history of smoking and currently smoke or have quit within the past 15 years. Fecal occult blood test (FOBT) of the stool. You may have this test every year starting at age 10. Flexible sigmoidoscopy or colonoscopy. You may have a sigmoidoscopy every 5 years or a colonoscopy every 10 years starting at age 90. Hepatitis C blood test. Hepatitis B blood test. Sexually transmitted disease (STD) testing. Diabetes screening. This is done by checking your blood sugar (glucose) after you have not eaten for a while (fasting). You may have this done every 1-3 years. Bone density scan. This is done to screen for osteoporosis. You may have this done starting at age 24. Mammogram. This may be done every 1-2 years. Talk to your health care provider about how often you should have regular mammograms. Talk with your health care provider about your test results, treatment options, and if necessary, the need for more tests. Vaccines  Your health care provider may recommend  certain vaccines, such as: Influenza vaccine. This is recommended every year. Tetanus, diphtheria, and acellular pertussis (Tdap, Td) vaccine. You may need a Td  booster every 10 years. Zoster vaccine. You may need this after age 9. Pneumococcal 13-valent conjugate (PCV13) vaccine. One dose is recommended after age 12. Pneumococcal polysaccharide (PPSV23) vaccine. One dose is recommended after age 26. Talk to your health care provider about which screenings and vaccines you need and how often you need them. This information is not intended to replace advice given to you by your health care provider. Make sure you discuss any questions you have with your health care provider. Document Released: 05/10/2015 Document Revised: 01/01/2016 Document Reviewed: 02/12/2015 Elsevier Interactive Patient Education  2017 [place] Prevention in the Home Falls can cause injuries. They can happen to people of all ages. There are many things you can do to make your home safe and to help prevent falls. What can I do on the outside of my home? Regularly fix the edges of walkways and driveways and fix any cracks. Remove anything that might make you trip as you walk through a door, such as a raised step or threshold. Trim any bushes or trees on the path to your home. Use bright outdoor lighting. Clear any walking paths of anything that might make someone trip, such as rocks or tools. Regularly check to see if handrails are loose or broken. Make sure that both sides of any steps have handrails. Any raised decks and porches should have guardrails on the edges. Have any leaves, snow, or ice cleared regularly. Use sand or salt on walking paths during winter. Clean up any spills in your garage right away. This includes oil or grease spills. What can I do in the bathroom? Use night lights. Install grab bars by the toilet and in the tub and shower. Do not use towel bars as grab bars. Use non-skid mats or decals in the tub or shower. If you need to sit down in the shower, use a plastic, non-slip stool. Keep the floor dry. Clean up any water that spills on the floor  as soon as it happens. Remove soap buildup in the tub or shower regularly. Attach bath mats securely with double-sided non-slip rug tape. Do not have throw rugs and other things on the floor that can make you trip. What can I do in the bedroom? Use night lights. Make sure that you have a light by your bed that is easy to reach. Do not use any sheets or blankets that are too big for your bed. They should not hang down onto the floor. Have a firm chair that has side arms. You can use this for support while you get dressed. Do not have throw rugs and other things on the floor that can make you trip. What can I do in the kitchen? Clean up any spills right away. Avoid walking on wet floors. Keep items that you use a lot in easy-to-reach places. If you need to reach something above you, use a strong step stool that has a grab bar. Keep electrical cords out of the way. Do not use floor polish or wax that makes floors slippery. If you must use wax, use non-skid floor wax. Do not have throw rugs and other things on the floor that can make you trip. What can I do with my stairs? Do not leave any items on the stairs. Make sure that there are handrails on both sides of the  stairs and use them. Fix handrails that are broken or loose. Make sure that handrails are as long as the stairways. Check any carpeting to make sure that it is firmly attached to the stairs. Fix any carpet that is loose or worn. Avoid having throw rugs at the top or bottom of the stairs. If you do have throw rugs, attach them to the floor with carpet tape. Make sure that you have a light switch at the top of the stairs and the bottom of the stairs. If you do not have them, ask someone to add them for you. What else can I do to help prevent falls? Wear shoes that: Do not have high heels. Have rubber bottoms. Are comfortable and fit you well. Are closed at the toe. Do not wear sandals. If you use a stepladder: Make sure that it is  fully opened. Do not climb a closed stepladder. Make sure that both sides of the stepladder are locked into place. Ask someone to hold it for you, if possible. Clearly mark and make sure that you can see: Any grab bars or handrails. First and last steps. Where the edge of each step is. Use tools that help you move around (mobility aids) if they are needed. These include: Canes. Walkers. Scooters. Crutches. Turn on the lights when you go into a dark area. Replace any light bulbs as soon as they burn out. Set up your furniture so you have a clear path. Avoid moving your furniture around. If any of your floors are uneven, fix them. If there are any pets around you, be aware of where they are. Review your medicines with your doctor. Some medicines can make you feel dizzy. This can increase your chance of falling. Ask your doctor what other things that you can do to help prevent falls. This information is not intended to replace advice given to you by your health care provider. Make sure you discuss any questions you have with your health care provider. Document Released: 02/07/2009 Document Revised: 09/19/2015 Document Reviewed: 05/18/2014 Elsevier Interactive Patient Education  2017 [employer].

## 2021-10-24 ENCOUNTER — Telehealth: Payer: Self-pay

## 2021-10-24 NOTE — Telephone Encounter (Signed)
   Telephone encounter was:  Successful.  10/24/2021 Name: [name] Good MRN: [id number] DOB: 1950/05/02  [name] Good is a 71 y.o. year old female who is a primary care patient of [name], [name], MD . The community resource team was consulted for assistance with Transportation Needs  and in home assistance.  Care guide performed the following interventions: Spoke with patient, she does not need transportation, but is interested in assistance in the home. Verified email address [email or web address]. Sent information for [employer] In Home Assistance Program.  Follow Up Plan:  Care guide will follow up with patient by phone over the next 7 days.  Scarlet Abad, AAS Paralegal, [facility] Care Guide  Embedded Care Coordination [place]  Care Management  [address] [place], [place] [address] ??[email or web address]  ?? [phone]   [email or web address]

## 2021-10-27 ENCOUNTER — Telehealth: Payer: Self-pay

## 2021-10-27 NOTE — Telephone Encounter (Signed)
   Telephone encounter was:  Unsuccessful.  10/27/2021 Name: VENNIE SALSBURY MRN: [id number] DOB: [birth year]  Unsuccessful outbound call made today to assist with:   transportation and in home assistance.  Outreach Attempt:  2nd Attempt  Unable to leave message, voicemail did not pick-up.  Louden Houseworth, AAS Paralegal, [facility] Care Guide  Embedded Care Coordination [place]  Care Management  [address] [place], [place] [address] ??[email or web address]  ?? [phone]   [email or web address]

## 2021-10-30 ENCOUNTER — Ambulatory Visit (INDEPENDENT_AMBULATORY_CARE_PROVIDER_SITE_OTHER): Payer: Medicare HMO | Admitting: Family Medicine

## 2021-10-30 VITALS — BP 140/80 | HR 96 | Temp 97.6°F | Ht 63.0 in | Wt 211.4 lb

## 2021-10-30 DIAGNOSIS — M1990 Unspecified osteoarthritis, unspecified site: Secondary | ICD-10-CM

## 2021-10-30 DIAGNOSIS — F321 Major depressive disorder, single episode, moderate: Secondary | ICD-10-CM | POA: Diagnosis not present

## 2021-10-30 DIAGNOSIS — I6509 Occlusion and stenosis of unspecified vertebral artery: Secondary | ICD-10-CM

## 2021-10-30 DIAGNOSIS — E2839 Other primary ovarian failure: Secondary | ICD-10-CM | POA: Diagnosis not present

## 2021-10-30 DIAGNOSIS — G894 Chronic pain syndrome: Secondary | ICD-10-CM

## 2021-10-30 DIAGNOSIS — I679 Cerebrovascular disease, unspecified: Secondary | ICD-10-CM | POA: Diagnosis not present

## 2021-10-30 DIAGNOSIS — L578 Other skin changes due to chronic exposure to nonionizing radiation: Secondary | ICD-10-CM

## 2021-10-30 MED ORDER — PHENTERMINE HCL 37.5 MG PO TABS: ORAL_TABLET | ORAL | 0 refills | Status: DC

## 2021-10-30 MED ORDER — PREDNISONE 5 MG PO TABS: 5.0000 mg | ORAL_TABLET | Freq: Every day | ORAL | 3 refills | Status: DC

## 2021-10-30 MED ORDER — ROPINIROLE HCL 3 MG PO TABS: 3.0000 mg | ORAL_TABLET | Freq: Every evening | ORAL | 3 refills | Status: DC | PRN

## 2021-10-30 MED ORDER — ATORVASTATIN CALCIUM 80 MG PO TABS: 80.0000 mg | ORAL_TABLET | Freq: Every day | ORAL | 3 refills | Status: DC

## 2021-10-30 MED ORDER — ARIPIPRAZOLE 5 MG PO TABS: 5.0000 mg | ORAL_TABLET | Freq: Every day | ORAL | 3 refills | Status: DC

## 2021-10-30 MED ORDER — OXYCODONE-ACETAMINOPHEN 10-325 MG PO TABS: 1.0000 | ORAL_TABLET | Freq: Every day | ORAL | 0 refills | Status: DC

## 2021-10-30 NOTE — Progress Notes (Signed)
   [name] Good is a 71 y.o. female who presents today for an office visit.  Assessment/Plan:  New/Acute Problems: Sun damaged skin Will refer to dermatology.   Chronic Problems Addressed Today: Chronic pain syndrome Database no red flags.  Medications help ability stay functional and active.  No side effects.  We will refill her oxycodone 10-325 4-5 times daily as needed.  Follow up in 3 months.   Cerebrovascular disease Likely contributing to her dizziness.  She is on Lipitor and aspirin.  Depression, major, single episode, moderate (HCC) Symptoms have been worsening. Refused PHQ9.  She is interested in changing her antidepressants.  No reported SI or HI.  She will continue Lexapro 20 mg daily for now as she feels like it is giving her some benefit in the past.  We discussed augmenting agents.  She has been on Wellbutrin in the past but did not like the side effects.  We will try low-dose Abilify.  She will follow-up with me in a couple of weeks via MyChart.  We discussed potential side effects.  We can titrate the dose as needed.  Arthritis Continue management per orthopedics.  They have discussed potential knee replacement surgery however her orthopedic surgeon told her that she needed to have her dizziness resolved before they would do this.  She uses Percocet 10-325 as above.      Subjective:  HPI:  See A/P for status of chronic conditions.  She was last seen a couple months ago.  Since her last visit she has been seen by neurology who did not have any further recommendations for her dizziness.  She tells me she saw her cardiologist about 5 weeks ago and is having some further testing done to see if there is any other possible cause of her dizziness.  This is still bothersome for her.  She has also had a little worsening depression.  She is interested in possibly switching medications.  No SI or HI.       Objective:  Physical Exam: BP 140/80   Pulse 96   Temp 97.6 F (36.4  C) (Temporal)   Ht 5\' 3"  (1.6 m)   Wt 211 lb 6.4 oz (95.9 kg)   SpO2 96%   BMI 37.45 kg/m   Wt Readings from Last 3 Encounters:  10/30/21 211 lb 6.4 oz (95.9 kg)  08/28/21 212 lb (96.2 kg)  07/15/21 206 lb 3.2 oz (93.5 kg)    Gen: No acute distress, resting comfortably CV: Regular rate and rhythm with no murmurs appreciated Pulm: Normal work of breathing, clear to auscultation bilaterally with no crackles, wheezes, or rhonchi Neuro: Grossly normal, moves all extremities Psych: Normal affect and thought content      [name] Good M. 07/17/21, MD 10/30/2021 1:34 PM

## 2021-10-30 NOTE — Assessment & Plan Note (Signed)
Database no red flags.  Medications help ability stay functional and active.  No side effects.  We will refill her oxycodone 10-325 4-5 times daily as needed.  Follow up in 3 months.

## 2021-10-30 NOTE — Assessment & Plan Note (Signed)
Likely contributing to her dizziness.  She is on Lipitor and aspirin.

## 2021-10-30 NOTE — Assessment & Plan Note (Addendum)
Symptoms have been worsening. Refused PHQ9.  She is interested in changing her antidepressants.  No reported SI or HI.  She will continue Lexapro 20 mg daily for now as she feels like it is giving her some benefit in the past.  We discussed augmenting agents.  She has been on Wellbutrin in the past but did not like the side effects.  We will try low-dose Abilify.  She will follow-up with me in a couple of weeks via MyChart.  We discussed potential side effects.  We can titrate the dose as needed.

## 2021-10-30 NOTE — Assessment & Plan Note (Signed)
Continue management per orthopedics.  They have discussed potential knee replacement surgery however her orthopedic surgeon told her that she needed to have her dizziness resolved before they would do this.  She uses Percocet 10-325 as above.

## 2021-10-30 NOTE — Patient Instructions (Signed)
It was very nice to see you today!  We will refill your medications today.  Please try the Abilify to see if this helps with your depressive symptoms.  This works well with the Lexapro.  Please send a message in a few weeks to let me know how this is working.  We will see you back in 3 months for medication check.  Please come back to see [place] sooner if needed.  Take care, Dr [name]  PLEASE NOTE:  If you had any lab tests please let us know if you have not heard back within a few days. You may see your results on mychart before we have a chance to review them but we will give you a call once they are reviewed by [place]. If we ordered any referrals today, please let us know if you have not heard from their office within the next week.   Please try these tips to maintain a healthy lifestyle:  Eat at least 3 REAL meals and 1-2 snacks per day.  Aim for no more than 5 hours between eating.  If you eat breakfast, please do so within one hour of getting up.   Each meal should contain half fruits/vegetables, one quarter protein, and one quarter carbs (no bigger than a computer mouse)  Cut down on sweet beverages. This includes juice, soda, and sweet tea.   Drink at least 1 glass of water with each meal and aim for at least 8 glasses per day  Exercise at least 150 minutes every week.

## 2021-11-04 ENCOUNTER — Telehealth: Payer: Self-pay

## 2021-11-04 NOTE — Telephone Encounter (Signed)
   Telephone encounter was:  Unsuccessful.  11/04/2021 Name: [name] Good MRN: [id number] DOB: Apr 19, 1951  Unsuccessful outbound call made today to assist with:  in home assistance.  Outreach Attempt:  3rd Attempt.  Referral closed unable to contact patient.  Unable to leave message, voicemail did not pick-up.        Serafin Decatur, AAS Paralegal, [facility] Care Guide  Embedded Care Coordination [place]  Care Management  [address] [place], [place] [address] ??[email or web address]  ?? [phone]   [email or web address]

## 2021-11-05 ENCOUNTER — Telehealth: Payer: Self-pay | Admitting: Family Medicine

## 2021-11-05 NOTE — Telephone Encounter (Signed)
Pt states: -had an appt 07/11 with Dr. Orpah Cobb for an echocardiogram. -appointment was to help with dizziness -office doors were locked and phones off  FO Rep confirmed office phone listed on [facility] website are off.    Pt requests referral to different cardiologist to diagnose the dizziness.    FO Rep cannot locate previous referral for cardiology.  Please call patient with next steps.

## 2021-11-06 ENCOUNTER — Other Ambulatory Visit: Payer: Self-pay | Admitting: *Deleted

## 2021-11-06 DIAGNOSIS — R42 Dizziness and giddiness: Secondary | ICD-10-CM

## 2021-11-06 NOTE — Telephone Encounter (Signed)
Referral placed.

## 2021-11-06 NOTE — Telephone Encounter (Signed)
Ok with me. Please place any necessary orders. 

## 2021-11-06 NOTE — Telephone Encounter (Signed)
Ok to place referral.

## 2021-11-18 ENCOUNTER — Telehealth: Payer: Self-pay | Admitting: Family Medicine

## 2021-11-18 NOTE — Telephone Encounter (Signed)
Patient states she was able to pick up 41 pills at her usual pharmacy following her OV with Dr. [name] on 10/30/21. States at this time they informed her that the medication was on long term back order. Per the pharmacy's recommendation, patient states she has been calling different pharmacies.   Patient was able to find a pharmacy with her medication and requests the rest of her medication be sent to the pharmacy below.   LAST APPOINTMENT DATE:  10/30/21  NEXT APPOINTMENT DATE: 02/03/22  MEDICATION:  oxyCODONE-acetaminophen (PERCOCET) 10-325 MG tablet   Is the patient out of medication? No, Has a couple of doses left   PHARMACY: [facility]-- [address] [place], [place] [address]

## 2021-11-20 MED ORDER — OXYCODONE-ACETAMINOPHEN 10-325 MG PO TABS: 1.0000 | ORAL_TABLET | Freq: Every day | ORAL | 0 refills | Status: DC

## 2021-11-20 NOTE — Addendum Note (Signed)
Addended by: Dyann Kief on: 11/20/2021 05:08 PM   Modules accepted: Orders

## 2021-11-20 NOTE — Telephone Encounter (Signed)
Pt states: -rx sent to incorrect pharmacy.  Please resend to [facility]

## 2021-11-21 MED ORDER — OXYCODONE-ACETAMINOPHEN 10-325 MG PO TABS
1.0000 | ORAL_TABLET | Freq: Every day | ORAL | 0 refills | Status: DC
Start: 2021-11-21 — End: 2021-12-22

## 2021-11-21 NOTE — Telephone Encounter (Signed)
Patient states she has been unable to get oxyCODONE-acetaminophen (PERCOCET) 10-325 MG tablet. Patient states RX that was sent was sent to the wrong Pharmacy (patient states she had requested Rx be sent to [facility] in [place] states she is not sure if [facility] still has the medication above).  Patient requests that if [facility] does not have the medication listed above, Patients requests a RX for a smaller mg be sent to [facility]. Patient states [facility] does not have either mg dosage for the above medication.  Patient states she will be out of medication by this weekend.  Patient states [facility] will not tell her what mg of the above medication they have and that only a Doctor's office can receive that information.

## 2021-11-21 NOTE — Addendum Note (Signed)
Addended by: Ardith Dark on: 11/21/2021 12:12 PM   Modules accepted: Orders

## 2021-11-24 NOTE — Telephone Encounter (Signed)
Can we check and see what she needs? Recommend office visit if she is having issues.  [name]. [name], MD 11/24/2021 11:23 AM

## 2021-11-24 NOTE — Telephone Encounter (Signed)
Spoke with patient, stated medication Rx Oxycodone is back order, may come today about 2:00pm Will call later if Rx need to be change

## 2021-11-27 ENCOUNTER — Ambulatory Visit: Payer: Medicare HMO | Admitting: Interventional Cardiology

## 2021-11-27 ENCOUNTER — Encounter: Payer: Self-pay | Admitting: Interventional Cardiology

## 2021-11-27 VITALS — BP 124/74 | HR 103 | Ht 63.0 in | Wt 213.8 lb

## 2021-11-27 DIAGNOSIS — R42 Dizziness and giddiness: Secondary | ICD-10-CM

## 2021-11-27 DIAGNOSIS — E782 Mixed hyperlipidemia: Secondary | ICD-10-CM

## 2021-11-27 DIAGNOSIS — R0609 Other forms of dyspnea: Secondary | ICD-10-CM | POA: Diagnosis not present

## 2021-11-27 NOTE — Patient Instructions (Signed)
Medication Instructions:  Your physician recommends that you continue on your current medications as directed. Please refer to the Current Medication list given to you today.  *If you need a refill on your cardiac medications before your next appointment, please call your pharmacy*   Lab Work: none If you have labs (blood work) drawn today and your tests are completely normal, you will receive your results only by: MyChart Message (if you have MyChart) OR A paper copy in the mail If you have any lab test that is abnormal or we need to change your treatment, we will call you to review the results.   Testing/Procedures: Your physician has requested that you have an echocardiogram. Echocardiography is a painless test that uses sound waves to create images of your heart. It provides your doctor with information about the size and shape of your heart and how well your heart's chambers and valves are working. This procedure takes approximately one hour. There are no restrictions for this procedure.    Follow-Up: At [facility], you and your health needs are our priority.  As part of our continuing mission to provide you with exceptional heart care, we have created designated Provider Care Teams.  These Care Teams include your primary Cardiologist (physician) and Advanced Practice Providers (APPs -  Physician Assistants and Nurse Practitioners) who all work together to provide you with the care you need, when you need it.  We recommend signing up for the patient portal called "MyChart".  Sign up information is provided on this After Visit Summary.  MyChart is used to connect with patients for Virtual Visits (Telemedicine).  Patients are able to view lab/test results, encounter notes, upcoming appointments, etc.  Non-urgent messages can be sent to your provider as well.   To learn more about what you can do with MyChart, go to [email or web address].    Your next appointment:   Based on  results  The format for your next appointment:   In Person  Provider:   Lance Muss, MD     Other Instructions    Important Information About [employer]

## 2021-11-27 NOTE — Progress Notes (Signed)
Cardiology Office Note   Date:  11/27/2021   ID:  [name] Good, DOB November 10, 1950, MRN [id number]  PCP:  Ardith Dark, MD    No chief complaint on file.  dizziness  Wt Readings from Last 3 Encounters:  11/27/21 213 lb 12.8 oz (97 kg)  10/30/21 211 lb 6.4 oz (95.9 kg)  08/28/21 212 lb (96.2 kg)       History of Present Illness: [name] Good is a 71 y.o. female who is being seen today for the evaluation of dizziness at the request of Ardith Dark, MD.   She had a CVA in June 2021.  Records show: "MRI of brain showed acute ischemic right thalamic infarction and old right parieto-occipital stroke. 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 Upon further questioning patient had indigestion type feeling 3-4 weeks ago for which she did not seek medical attention."  She was treated with Eliquis for 3 to 6 months.    She was hospitalized in November 2021 with acute embolic stroke.  There was aortic atherosclerosis noted.  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  She was seen by Dr. Algie Coffer at that time.  Echo in 2021 showed: "Left ventricular ejection fraction, by estimation, is 60 to 65%. The  left ventricle has normal function. The left ventricle has no regional  wall motion abnormalities. Left ventricular diastolic parameters are  consistent with Grade I diastolic  dysfunction (impaired relaxation).   2. Right ventricular systolic function is normal. The right ventricular  size is normal. There is normal pulmonary artery systolic pressure. The  estimated right ventricular systolic pressure is 23.1 mmHg.   3. The mitral valve is normal in structure. No evidence of mitral valve  regurgitation. No evidence of mitral stenosis.   4. The aortic valve was not well visualized. Aortic valve regurgitation  is not visualized. No aortic stenosis is present.   5. The inferior vena cava is normal in size with greater than 50%  respiratory variability,  suggesting right atrial pressure of 3 mmHg. "  She had a TEE in 2021 showing aortic atherosclerosis but no significant valvular issues.  No intracardiac thrombus.    She has had persistent dizziness.  Walking limited by knee pain.    MRA in 3/23 showed bilateral carotids are patent and right vertebral is dominant and patent.  Small left vertebral.    No recent falls.   Some DOE.  Denies : exertional Chest pain.  Leg edema. Nitroglycerin use. Orthopnea. Palpitations. Paroxysmal nocturnal dyspnea.  Syncope.     Past Medical History:  Diagnosis Date   Anxiety    Arthritis    Depression    GERD (gastroesophageal reflux disease)    Myocardial infarction (HCC) 89   no visits to cardiac dr(thomas kelly) since 89   Stroke (HCC)    Wound dehiscence    lumbar    Past Surgical History:  Procedure Laterality Date   APPENDECTOMY     17 yrs   BACK SURGERY     BREAST SURGERY Left    cyst   BUBBLE STUDY  03/12/2020   Procedure: BUBBLE STUDY;  Surgeon: Orpah Cobb, MD;  Location: [facility];  Service: Cardiovascular;;   CARDIAC CATHETERIZATION  45   CHOLECYSTECTOMY     70 yrs old   LUMBAR LAMINECTOMY/DECOMPRESSION MICRODISCECTOMY N/A 06/14/2015   Procedure: Lumbar Three-Four,Lumbar Four-Five, Lumbar Five-Sacral One Laminectomy;  Surgeon: Tia Alert, MD;  Location: [facility] NEURO ORS;  Service: Neurosurgery;  Laterality: N/A;   LUMBAR WOUND DEBRIDEMENT N/A 07/24/2015   Procedure: lumbar wound revision;  Surgeon: Eustace Moore, MD;  Location: [facility] OR[facility];  Service: Neurosurgery;  Laterality: N/A;   TEE WITHOUT CARDIOVERSION N/A 03/12/2020   Procedure: TRANSESOPHAGEAL ECHOCARDIOGRAM (TEE);  Surgeon: Dixie Dials, MD;  Location: [facility] ENDOSCOPY;  Service: Cardiovascular;  Laterality: N/A;   TOTAL HIP ARTHROPLASTY Right 10/25/2015   Procedure: RIGHT TOTAL HIP ARTHROPLASTY ANTERIOR APPROACH;  Surgeon: Mcarthur Rossetti, MD;  Location: [facility] ORS;  Service: Orthopedics;  Laterality: Right;    TUBAL LIGATION       Current Outpatient Medications  Medication Sig Dispense Refill   ARIPiprazole (ABILIFY) 5 MG tablet Take 1 tablet (5 mg total) by mouth daily. 30 tablet 3   aspirin EC 81 MG tablet Take 81 mg by mouth daily. Swallow whole.     atorvastatin (LIPITOR) 80 MG tablet Take 1 tablet (80 mg total) by mouth daily. 90 tablet 3   calcium carbonate (TUMS EX) 750 MG chewable tablet Chew 1 tablet by mouth 2 (two) times daily as needed for heartburn.     docusate sodium (COLACE) 100 MG capsule Take 100 mg by mouth 2 (two) times daily.     escitalopram (LEXAPRO) 20 MG tablet Take 20 mg by mouth daily.     famotidine (PEPCID) 20 MG tablet Take 20 mg by mouth daily as needed for heartburn or indigestion.     loratadine (CLARITIN) 10 MG tablet Take 10 mg by mouth daily.     oxyCODONE-acetaminophen (PERCOCET) 10-325 MG tablet Take 1 tablet by mouth 5 (five) times daily. 150 tablet 0   phentermine (ADIPEX-P) 37.5 MG tablet TAKE 1 TABLET BY MOUTH DAILY FOR WEIGHT LOSS. LOSE THREE LBS EVERY MONTH. 30 tablet 0   predniSONE (DELTASONE) 5 MG tablet Take 1 tablet (5 mg total) by mouth daily. 90 tablet 3   rOPINIRole (REQUIP) 3 MG tablet Take 1 tablet (3 mg total) by mouth at bedtime as needed (restless leg). 90 tablet 3   fluticasone (FLONASE) 50 MCG/ACT nasal spray Place into both nostrils. (Patient not taking: Reported on 11/27/2021)     valACYclovir (VALTREX) 1000 MG tablet TAKE 1 TABLET BY MOUTH THREE TIMES DAILY (Patient not taking: Reported on 11/27/2021) 30 tablet 0   No current facility-administered medications for this visit.    Allergies:   Amoxicillin    Social History:  The patient  reports that she quit smoking about 8 years ago. Her smoking use included cigarettes. She has a 80.00 pack-year smoking history. She has never used smokeless tobacco. She reports that she does not currently use alcohol. She reports that she does not use drugs.   Family History:  The patient's family history  includes Stroke in her mother.    ROS:  Please see the history of present illness.   Otherwise, review of systems are positive for dizziness.   All other systems are reviewed and negative.    PHYSICAL EXAM: VS:  BP 124/74   Pulse (!) 103   Ht 5\' 3"  (1.6 m)   Wt 213 lb 12.8 oz (97 kg)   SpO2 94%   BMI 37.87 kg/m  , BMI Body mass index is 37.87 kg/m. GEN: Well nourished, well developed, in no acute distress HEENT: normal Neck: no JVD, carotid bruits, or masses Cardiac: RRR; no murmurs, rubs, or gallops,no edema  Respiratory:  clear to auscultation bilaterally, normal work of breathing GI: soft, nontender, nondistended, + BS  MS: no deformity or atrophy Skin: warm and dry, no rash Neuro:  Strength and sensation are intact Psych: euthymic mood, full affect   EKG:   The ekg ordered today demonstrates sinus tach, otherwise normal   Recent Labs: 07/15/2021: ALT 19; BUN 18; Creatinine, Ser 0.90; Hemoglobin 14.5; Platelets 275.0; Potassium 4.2; Sodium 139; TSH 2.13   Lipid Panel    Component Value Date/Time   CHOL 201 (H) 07/15/2021 1446   TRIG 160.0 (H) 07/15/2021 1446   HDL 57.30 07/15/2021 1446   CHOLHDL 4 07/15/2021 1446   VLDL 32.0 07/15/2021 1446   LDLCALC 112 (H) 07/15/2021 1446     Other studies Reviewed: Additional studies/ records that were reviewed today with results demonstrating: Hospital records and labs reviewed.  MRA reviewed..   ASSESSMENT AND PLAN:  Dizziness: Unclear etiology.  May be residual effect from prior CVAs per notes from neurology.  She feels like her dizziness was better while she was taking Eliquis.  There is no clear indication for anticoagulation for her.  I will check with Dr. Everlena Cooper as to whether clopidogrel may be indicated given her prior CVA.  She feels like she wants to try some sort of blood thinner to see if her dizziness improves.  I reviewed her MRA results.  She has adequate cerebral circulation. She may benefit from some home health  physical therapy to help with her overall conditioning. DOE: Check echocardiogram.  Has had a cardiomyopathy in the past.  No obvious heart failure. Hyperlipidemia: Continue atorvastatin.  LDL 112 in March 2023.  Would like to see LDL at least under 100.  She is hoping to try to be more active. No clear angina.  No indication for ischemic cardiac testing at this time.  She needs aggressive secondary prevention.   Current medicines are reviewed at length with the patient today.  The patient concerns regarding her medicines were addressed.  The following changes have been made:  No change  Labs/ tests ordered today include:  No orders of the defined types were placed in this encounter.   Recommend 150 minutes/week of aerobic exercise Low fat, low carb, high fiber diet recommended  Disposition:   FU for echo   Signed, Lance Muss, MD  11/27/2021 1:30 PM    [facility] [address] [place], [place], [place]  [address] Phone: [phone]; Fax: [phone]

## 2021-12-01 ENCOUNTER — Other Ambulatory Visit: Payer: Self-pay | Admitting: Family Medicine

## 2021-12-01 ENCOUNTER — Telehealth: Payer: Self-pay

## 2021-12-01 MED ORDER — CLOPIDOGREL BISULFATE 75 MG PO TABS: 75.0000 mg | ORAL_TABLET | Freq: Every day | ORAL | 3 refills | Status: DC

## 2021-12-01 NOTE — Telephone Encounter (Signed)
-----   Message from Corky Crafts, MD sent at 12/01/2021 12:14 AM EDT ----- Would not use Eliquis as per neuro recs.  Can offer the patient that we can change aspirin to clopidogrel 75 mg daily and see if she feels better.   JV   ----- Message ----- From: Drema Dallas, DO Sent: 11/28/2021   7:00 AM EDT To: Corky Crafts, MD  Vonna Kotyk I agree that I wouldn't restart Eliquis to treat dizziness if there is no cardiac indication.  Looking back in the notes, it would be reasonable to have her switch from aspirin to Plavix 75mg  daily (but not dual antiplatelet therapy) for secondary stroke prevention. Adam ----- Message ----- From: , MD Sent: 11/27/2021   1:51 PM EDT To: 01/27/2022, DO  Hi Adam, She thinks her dizziness was better taking Eliquis.  I don't see an indication for her to be on Eliquis.  Would there be an indication for clopidogrel and aspirin given her prior CVA? THanks. Drema Dallas

## 2021-12-01 NOTE — Telephone Encounter (Signed)
Spoke with the patient and gave recommendations from Dr. Eldridge Dace. Patient would like to try Plavix 75 mg daily. She is aware to discontinue her Aspirin.

## 2021-12-09 ENCOUNTER — Ambulatory Visit (HOSPITAL_COMMUNITY): Payer: Medicare HMO | Attending: Cardiovascular Disease

## 2021-12-09 DIAGNOSIS — Z87891 Personal history of nicotine dependence: Secondary | ICD-10-CM | POA: Insufficient documentation

## 2021-12-09 DIAGNOSIS — E669 Obesity, unspecified: Secondary | ICD-10-CM | POA: Diagnosis not present

## 2021-12-09 DIAGNOSIS — I2510 Atherosclerotic heart disease of native coronary artery without angina pectoris: Secondary | ICD-10-CM | POA: Insufficient documentation

## 2021-12-09 DIAGNOSIS — R0609 Other forms of dyspnea: Secondary | ICD-10-CM | POA: Diagnosis not present

## 2021-12-09 DIAGNOSIS — R42 Dizziness and giddiness: Secondary | ICD-10-CM | POA: Diagnosis not present

## 2021-12-09 DIAGNOSIS — I252 Old myocardial infarction: Secondary | ICD-10-CM | POA: Insufficient documentation

## 2021-12-09 DIAGNOSIS — R0600 Dyspnea, unspecified: Secondary | ICD-10-CM | POA: Insufficient documentation

## 2021-12-09 DIAGNOSIS — E119 Type 2 diabetes mellitus without complications: Secondary | ICD-10-CM | POA: Diagnosis not present

## 2021-12-09 DIAGNOSIS — Z8673 Personal history of transient ischemic attack (TIA), and cerebral infarction without residual deficits: Secondary | ICD-10-CM | POA: Diagnosis not present

## 2021-12-09 LAB — ECHOCARDIOGRAM COMPLETE
AR max vel: 3.02 cm2
AV Area VTI: 2.81 cm2
AV Area mean vel: 2.87 cm2
AV Mean grad: 5.5 mmHg
AV Peak grad: 9.7 mmHg
Ao pk vel: 1.56 m/s
Area-P 1/2: 5.13 cm2
S' Lateral: 1.7 cm

## 2021-12-10 ENCOUNTER — Telehealth: Payer: Self-pay | Admitting: Family Medicine

## 2021-12-10 NOTE — Telephone Encounter (Signed)
Patient states she went for an ECHO 12/09/21 at [facility]. Patient states the Cardiologist told Patient that the ECHO turned out good but recommends Patient be seen by an ENT Specialist.  Patient requests a Referral be sent to an ENT Specialist.

## 2021-12-11 ENCOUNTER — Telehealth: Payer: Self-pay | Admitting: Family Medicine

## 2021-12-11 NOTE — Telephone Encounter (Signed)
Victorino Dike with [facility] requests to be called at ph# [phone] to give her the ICD10 Code for RX for Percocet.

## 2021-12-13 NOTE — Telephone Encounter (Signed)
Ok to placed referral  

## 2021-12-13 NOTE — Telephone Encounter (Signed)
Call Pharmacy, not longer needing information  Patient pick up Rx already

## 2021-12-15 ENCOUNTER — Other Ambulatory Visit: Payer: Self-pay | Admitting: *Deleted

## 2021-12-15 DIAGNOSIS — R42 Dizziness and giddiness: Secondary | ICD-10-CM

## 2021-12-15 NOTE — Telephone Encounter (Signed)
Ok with me. Please place any necessary orders. 

## 2021-12-15 NOTE — Telephone Encounter (Signed)
Referral placed.

## 2021-12-22 ENCOUNTER — Other Ambulatory Visit: Payer: Self-pay | Admitting: Family Medicine

## 2021-12-22 NOTE — Telephone Encounter (Signed)
  LAST APPOINTMENT DATE:  Please schedule appointment if longer than 1 year  10/30/21  NEXT APPOINTMENT DATE:  02/03/22  MEDICATION:oxyCODONE-acetaminophen (PERCOCET) 10-325 MG tablet  Is the patient out of medication? Yes  PHARMACY:  [facility] - [place], [place] - [address] Phone:  [phone]  Fax:  [phone]      Let patient know to contact pharmacy at the end of the day to make sure medication is ready.  Please notify patient to allow 48-72 hours to process

## 2021-12-23 MED ORDER — OXYCODONE-ACETAMINOPHEN 10-325 MG PO TABS: 1.0000 | ORAL_TABLET | Freq: Every day | ORAL | 0 refills | Status: DC

## 2021-12-26 ENCOUNTER — Other Ambulatory Visit: Payer: Self-pay | Admitting: Family Medicine

## 2022-01-19 ENCOUNTER — Encounter: Payer: Self-pay | Admitting: *Deleted

## 2022-02-03 ENCOUNTER — Ambulatory Visit (INDEPENDENT_AMBULATORY_CARE_PROVIDER_SITE_OTHER): Payer: Medicare HMO | Admitting: Family Medicine

## 2022-02-03 ENCOUNTER — Encounter: Payer: Self-pay | Admitting: Family Medicine

## 2022-02-03 VITALS — BP 125/83 | HR 78 | Temp 97.5°F | Ht 63.0 in | Wt 213.0 lb

## 2022-02-03 DIAGNOSIS — F321 Major depressive disorder, single episode, moderate: Secondary | ICD-10-CM

## 2022-02-03 DIAGNOSIS — G894 Chronic pain syndrome: Secondary | ICD-10-CM

## 2022-02-03 MED ORDER — ARIPIPRAZOLE 10 MG PO TABS: 10.0000 mg | ORAL_TABLET | Freq: Every day | ORAL | 3 refills | Status: DC

## 2022-02-03 MED ORDER — WEGOVY 0.25 MG/0.5ML ~~LOC~~ SOAJ: 0.2500 mg | SUBCUTANEOUS | 0 refills | Status: DC

## 2022-02-03 MED ORDER — ESCITALOPRAM OXALATE 20 MG PO TABS: 20.0000 mg | ORAL_TABLET | Freq: Every day | ORAL | 3 refills | Status: DC

## 2022-02-03 NOTE — Progress Notes (Signed)
   [name] Good is a 71 y.o. female who presents today for an office visit.  Assessment/Plan:  Chronic Problems Addressed Today: Chronic pain syndrome Database without red flags.  Medications help with ability stay active and perform ADLs.  No significant side effects.  Does not need refill on medications today.  She is on oxycodone 10-3 25 4-5 times daily as needed.  Will refill as needed.  She will follow-up in 3 months.  Morbid obesity ([place]) She is up about 2 pounds since her last visit.  Phentermine does not seem to be effective.  We also discussed potential side effects of phentermine.  She is interested in trying alternatives.  She would probably do well with GLP-1 agonist if insurance will pay for this.  We will try Wegovy.  We discussed potential side effects.  She will follow-up in a few weeks and we can titrate the dose as needed.  Depression, major, single episode, moderate (HCC) Symptoms are overall stable though still has quite a bit of depressive symptoms.  We started her on Abilify 5 mg daily at her last office visit she has not noticed much of a change since then.  We will increase to 10 mg daily.  Continue Lexapro 20 mg daily.  She has not had any side effects with either of these medications.  She will follow-up in a few weeks via MyChart and we can titrate the dose as needed.  Flu shot given today.     Subjective:  HPI:  See A/p for status chronic conditions.        Objective:  Physical Exam: BP 125/83   Pulse 78   Temp (!) 97.5 F (36.4 C) (Temporal)   Ht 5\' 3"  (1.6 m)   Wt 213 lb (96.6 kg)   SpO2 96%   BMI 37.73 kg/m   Wt Readings from Last 3 Encounters:  02/03/22 213 lb (96.6 kg)  11/27/21 213 lb 12.8 oz (97 kg)  10/30/21 211 lb 6.4 oz (95.9 kg)    Gen: No acute distress, resting comfortably CV: Regular rate and rhythm with no murmurs appreciated Pulm: Normal work of breathing, clear to auscultation bilaterally with no crackles, wheezes, or  rhonchi Neuro: Grossly normal, moves all extremities Psych: Normal affect and thought content      Carolos Fecher M. Jerline Pain, MD 02/03/2022 1:38 PM

## 2022-02-03 NOTE — Patient Instructions (Signed)
It was very nice to see you today!  We will increase your Abilify to 10 mg daily.  We will try Wegovy.  Send me a  message in a few weeks to let me know how this is working for you.  Please come back in 3 months for your follow-up visit.  Come back sooner if needed.  Take care, Dr Jerline Pain  PLEASE NOTE:  If you had any lab tests please let us know if you have not heard back within a few days. You may see your results on mychart before we have a chance to review them but we will give you a call once they are reviewed by [place]. If we ordered any referrals today, please let us know if you have not heard from their office within the next week.   Please try these tips to maintain a healthy lifestyle:  Eat at least 3 REAL meals and 1-2 snacks per day.  Aim for no more than 5 hours between eating.  If you eat breakfast, please do so within one hour of getting up.   Each meal should contain half fruits/vegetables, one quarter protein, and one quarter carbs (no bigger than a computer mouse)  Cut down on sweet beverages. This includes juice, soda, and sweet tea.   Drink at least 1 glass of water with each meal and aim for at least 8 glasses per day  Exercise at least 150 minutes every week.

## 2022-02-03 NOTE — Assessment & Plan Note (Signed)
She is up about 2 pounds since her last visit.  Phentermine does not seem to be effective.  We also discussed potential side effects of phentermine.  She is interested in trying alternatives.  She would probably do well with GLP-1 agonist if insurance will pay for this.  We will try Wegovy.  We discussed potential side effects.  She will follow-up in a few weeks and we can titrate the dose as needed.

## 2022-02-03 NOTE — Assessment & Plan Note (Signed)
Symptoms are overall stable though still has quite a bit of depressive symptoms.  We started her on Abilify 5 mg daily at her last office visit she has not noticed much of a change since then.  We will increase to 10 mg daily.  Continue Lexapro 20 mg daily.  She has not had any side effects with either of these medications.  She will follow-up in a few weeks via MyChart and we can titrate the dose as needed.

## 2022-02-03 NOTE — Assessment & Plan Note (Signed)
Database without red flags.  Medications help with ability stay active and perform ADLs.  No significant side effects.  Does not need refill on medications today.  She is on oxycodone 10-3 25 4-5 times daily as needed.  Will refill as needed.  She will follow-up in 3 months.

## 2022-02-04 ENCOUNTER — Telehealth: Payer: Self-pay | Admitting: Family Medicine

## 2022-02-04 NOTE — Telephone Encounter (Signed)
Patient states wagovy is on long term back order,would like to be placed on phentermine as she was taking it before.

## 2022-02-04 NOTE — Telephone Encounter (Signed)
Please advise 

## 2022-02-06 MED ORDER — PHENTERMINE HCL 37.5 MG PO TABS
37.5000 mg | ORAL_TABLET | Freq: Every day | ORAL | 2 refills | Status: DC
Start: 2022-02-06 — End: 2022-09-22

## 2022-02-09 DIAGNOSIS — R42 Dizziness and giddiness: Secondary | ICD-10-CM | POA: Diagnosis not present

## 2022-02-19 ENCOUNTER — Other Ambulatory Visit: Payer: Self-pay | Admitting: Family Medicine

## 2022-02-23 ENCOUNTER — Telehealth: Payer: Self-pay | Admitting: Family Medicine

## 2022-02-23 NOTE — Telephone Encounter (Signed)
Patient requests RX for  oxyCODONE-acetaminophen (PERCOCET) 10-325 MG tablet   Be resent to   [facility] # [place], [place] Phone: [phone]  Fax: [phone]     [facility] is out of stock  Patient is out of the above medication

## 2022-02-25 ENCOUNTER — Other Ambulatory Visit: Payer: Self-pay | Admitting: Family Medicine

## 2022-02-25 NOTE — Telephone Encounter (Signed)
Patient call stated [facility] is out of Rx Oxycodone  Requesting Rx to be transfer to [facility]

## 2022-02-26 NOTE — Telephone Encounter (Signed)
Rx was sent in.  [name]. Jerline Pain, MD 02/26/2022 4:54 PM

## 2022-03-16 ENCOUNTER — Telehealth: Payer: Self-pay | Admitting: Family Medicine

## 2022-03-16 NOTE — Telephone Encounter (Signed)
  LAST APPOINTMENT DATE:  02/03/22  NEXT APPOINTMENT DATE: 03/31/22  MEDICATION: oxyCODONE-acetaminophen (PERCOCET) 10-325 MG tablet   Is the patient out of medication? Will run out sometime next week  PHARMACY: [id number] - [place], [place] - [address] [place] [address] Phone: [phone]  Fax: [phone]   Patient states she called in so early since she always tends to have problems getting this refilled.

## 2022-03-17 MED ORDER — OXYCODONE-ACETAMINOPHEN 10-325 MG PO TABS: ORAL_TABLET | ORAL | 0 refills | Status: DC

## 2022-03-23 NOTE — Telephone Encounter (Signed)
Rx was sent to wrong pharmacy.   Need sent to:  [facility] # [address] [place] - [address] Phone: [phone]  Fax: [phone]

## 2022-03-23 NOTE — Telephone Encounter (Signed)
Rx Oxycodone cancelled at [facility]  Please resend it to [facility] pharmacy per patient request

## 2022-03-24 MED ORDER — OXYCODONE-ACETAMINOPHEN 10-325 MG PO TABS: 1.0000 | ORAL_TABLET | Freq: Every day | ORAL | 0 refills | Status: DC

## 2022-03-24 NOTE — Addendum Note (Signed)
Addended by: Ardith Dark on: 03/24/2022 03:15 PM   Modules accepted: Orders

## 2022-03-25 NOTE — Telephone Encounter (Signed)
Patient notified Rx send to [facility] pharmacy on 03/24/2022

## 2022-03-31 ENCOUNTER — Ambulatory Visit: Payer: Medicare HMO | Admitting: Family Medicine

## 2022-04-09 ENCOUNTER — Encounter: Payer: Self-pay | Admitting: *Deleted

## 2022-04-17 ENCOUNTER — Other Ambulatory Visit: Payer: Self-pay | Admitting: Family Medicine

## 2022-04-17 MED ORDER — OXYCODONE-ACETAMINOPHEN 10-325 MG PO TABS: 1.0000 | ORAL_TABLET | Freq: Every day | ORAL | 0 refills | Status: DC

## 2022-04-17 NOTE — Telephone Encounter (Signed)
  Encourage patient to contact the pharmacy for refills or they can request refills through [facility]  LAST APPOINTMENT DATE:  Please schedule appointment if longer than 1 year  NEXT APPOINTMENT DATE: 05/05/22  MEDICATION: oxyCODONE-acetaminophen (PERCOCET) 10-325 MG tablet   Is the patient out of medication? Will be out next week but calling in because of holiday.  PHARMACY:  [id number] - [place], [place] - [address] Phone: [phone]  Fax: [phone]      Let patient know to contact pharmacy at the end of the day to make sure medication is ready.  Please notify patient to allow 48-72 hours to process

## 2022-04-24 NOTE — Telephone Encounter (Signed)
Pt Rx was supposed to be sent to [facility], not [facility]. Pt also states [facility] does not have enough meds and pt would like for [place] to send meds to [facility]. She is going to pick up the half RX for now and get the next RX at [facility]. Please call pt back with details.

## 2022-04-28 MED ORDER — OXYCODONE-ACETAMINOPHEN 10-325 MG PO TABS: 1.0000 | ORAL_TABLET | Freq: Every day | ORAL | 0 refills | Status: DC

## 2022-04-28 NOTE — Addendum Note (Signed)
Addended by: Betti Cruz on: 04/28/2022 02:10 PM   Modules accepted: Orders

## 2022-04-28 NOTE — Telephone Encounter (Signed)
Future rx sent [facility]. She had 150 filled on 04/25/22.

## 2022-05-05 ENCOUNTER — Ambulatory Visit: Payer: Medicare HMO | Admitting: Family Medicine

## 2022-05-14 ENCOUNTER — Encounter: Payer: Self-pay | Admitting: Family Medicine

## 2022-05-14 ENCOUNTER — Ambulatory Visit (INDEPENDENT_AMBULATORY_CARE_PROVIDER_SITE_OTHER): Payer: Medicare HMO | Admitting: Family Medicine

## 2022-05-14 VITALS — BP 130/88 | HR 90 | Temp 98.0°F | Ht 63.0 in | Wt 212.0 lb

## 2022-05-14 DIAGNOSIS — G894 Chronic pain syndrome: Secondary | ICD-10-CM | POA: Diagnosis not present

## 2022-05-14 DIAGNOSIS — F321 Major depressive disorder, single episode, moderate: Secondary | ICD-10-CM | POA: Diagnosis not present

## 2022-05-14 DIAGNOSIS — G2581 Restless legs syndrome: Secondary | ICD-10-CM | POA: Diagnosis not present

## 2022-05-14 NOTE — Assessment & Plan Note (Signed)
Database without red flag.  Medications help with her pain and ability to perform ADLs.  No significant side effects.  Not due for refill today.  Continue current regimen oxycodone 10-325 4-5 times daily as needed.  Follow-up 3 months.

## 2022-05-14 NOTE — Assessment & Plan Note (Signed)
Stable on Requip.  3mg  nightly as needed.

## 2022-05-14 NOTE — Assessment & Plan Note (Signed)
Overall feels like she is doing well with Lexapro 20 mg daily.  Concern for possible side effects with Abilify as above and will be stopping today.  She will follow-up in a couple of weeks via MyChart to let me know how she is doing with this.  She has tried Wellbutrin in the past but has not tolerated well.  May consider switching to alternative SSRI if still not well-controlled.

## 2022-05-14 NOTE — Patient Instructions (Signed)
It was very nice to see you today!  Please stop the Abilify.  Let me know how this is working for your tremor in a few weeks.  Please let me know if you have any change in mood.  Please try stretching your legs before night.  Make sure that you are getting plenty of fluid.  Let me know if the cramping is not getting better.  No other medication changes today.  I will see back in 3 months.  Please come back to see me sooner if needed.  Take care, Dr Jerline Pain  PLEASE NOTE:  If you had any lab tests, please let us know if you have not heard back within a few days. You may see your results on mychart before we have a chance to review them but we will give you a call once they are reviewed by [place].   If we ordered any referrals today, please let us know if you have not heard from their office within the next week.   If you had any urgent prescriptions sent in today, please check with the pharmacy within an hour of our visit to make sure the prescription was transmitted appropriately.   Please try these tips to maintain a healthy lifestyle:  Eat at least 3 REAL meals and 1-2 snacks per day.  Aim for no more than 5 hours between eating.  If you eat breakfast, please do so within one hour of getting up.   Each meal should contain half fruits/vegetables, one quarter protein, and one quarter carbs (no bigger than a computer mouse)  Cut down on sweet beverages. This includes juice, soda, and sweet tea.   Drink at least 1 glass of water with each meal and aim for at least 8 glasses per day  Exercise at least 150 minutes every week.

## 2022-05-14 NOTE — Progress Notes (Signed)
   [name] Good is a 72 y.o. female who presents today for an office visit.  Assessment/Plan:  New/Acute Problems: Nocturnal Cramp No red flags.  Encouraged hydration.  We discussed labs however deferred for today.  She will try stretching at night and making sure that she is getting plenty of fluids.  She will let me know if not improving and we will recheck labs at that time.  Tremor Fine tremor noted on exam today that is bilateral and present with intention.  Potentially could be side effect of Abilify that we have increased the dose on the last few months.  Advised her to stop the Abilify.  She will follow-up with me in a couple of weeks to let me know how the tremor is progressing.  If still has persistent tremor after this would consider having her come back for labs versus referral back to neurology at that point.  Chronic Problems Addressed Today: Depression, major, single episode, moderate ([place]) Overall feels like she is doing well with Lexapro 20 mg daily.  Concern for possible side effects with Abilify as above and will be stopping today.  She will follow-up in a couple of weeks via MyChart to let me know how she is doing with this.  She has tried Wellbutrin in the past but has not tolerated well.  May consider switching to alternative SSRI if still not well-controlled.  Chronic pain syndrome Database without red flag.  Medications help with her pain and ability to perform ADLs.  No significant side effects.  Not due for refill today.  Continue current regimen oxycodone 10-325 4-5 times daily as needed.  Follow-up 3 months.  RLS (restless legs syndrome) Stable on Requip.  3mg  nightly as needed.     Subjective:  HPI:  Patient here for follow-up.  She was last seen here 3 months ago.  At her last visit we tried to get her on Wegovy however insurance would not pay for this.  We restarted her on phentermine.  She did this for a month but then stopped.  Also really visit we  increased her Abilify to 10 mg daily.  She has been doing well with this.  She [name] Good this is working well for her mood.  She has had a few additional issues that she would like to discuss today.  She has noticed a function of both hands for the last several weeks.  Worse with certain activities.  Worse with motion such as reaching and writing.  Symptoms been stable for the last few weeks.  No reported weakness or numbness.  She also occasionally has spasms in feet.  This happens at night.  No fevers or chills.  Some possible stretch out the foot and symptoms improved.  Usually located in right foot but sometimes has similar symptoms in the left foot as well.       Objective:  Physical Exam: BP 130/88   Pulse 90   Temp 98 F (36.7 C) (Temporal)   Ht 5\' 3"  (1.6 m)   Wt 212 lb (96.2 kg)   SpO2 97%   BMI 37.55 kg/m   Gen: No acute distress, resting comfortably CV: Regular rate and rhythm with no murmurs appreciated Pulm: Normal work of breathing, clear to auscultation bilaterally with no crackles, wheezes, or rhonchi Neuro: Grossly normal, moves all extremities Psych: Normal affect and thought content      Loyal Rudy M. Jerline Pain, MD 05/14/2022 2:04 PM

## 2022-05-21 ENCOUNTER — Telehealth: Payer: Self-pay | Admitting: Family Medicine

## 2022-05-21 NOTE — Telephone Encounter (Signed)
Patient stated her insurance needs a prior [place] for Wagovy. Would like this PA to be started. Patient also wants to advise Dr Jerline Pain that in order for her to have knee surgery her surgeons need her to lose weight.

## 2022-05-27 ENCOUNTER — Telehealth: Payer: Self-pay | Admitting: Family Medicine

## 2022-05-27 NOTE — Telephone Encounter (Signed)
Patient states: - Following ov on 01/18 she has stopped abilify but tremors still present  - They are hard to manage   Patient is requesting PCP send in a muscle relaxer to [facility] store at [address].

## 2022-05-28 NOTE — Telephone Encounter (Signed)
Recommend neurology referral for her tremors.  Ok to send in baclofen 10mg  tablet take 5mg  three times daily as needed for spasms. Dispense #30 with no refills.  [name]. Jerline Pain, MD 05/28/2022 1:05 PM

## 2022-05-28 NOTE — Telephone Encounter (Signed)
Please advise 

## 2022-06-01 ENCOUNTER — Other Ambulatory Visit: Payer: Self-pay

## 2022-06-01 DIAGNOSIS — G2581 Restless legs syndrome: Secondary | ICD-10-CM

## 2022-06-01 DIAGNOSIS — R251 Tremor, unspecified: Secondary | ICD-10-CM

## 2022-06-01 MED ORDER — BACLOFEN 10 MG PO TABS: 10.0000 mg | ORAL_TABLET | Freq: Three times a day (TID) | ORAL | 0 refills | Status: DC

## 2022-06-01 NOTE — Telephone Encounter (Signed)
Baclofen sent to pharmacy #[facility], 0 Referral to neuro sent, patient aware to call us back in 2 weeks if she has not been contacted to schedule

## 2022-06-23 ENCOUNTER — Other Ambulatory Visit: Payer: Self-pay | Admitting: Family Medicine

## 2022-06-23 NOTE — Telephone Encounter (Signed)
  Encourage patient to contact the pharmacy for refills or they can request refills through [facility]:  Please schedule appointment if longer than 1 year  NEXT APPOINTMENT DATE:  MEDICATION:  oxyCODONE-acetaminophen (PERCOCET) 10-325 MG tablet   Is the patient out of medication? Almost  PHARMACY:  [facility] # [place], [place] Phone: [phone]  Fax: [phone]      Let patient know to contact pharmacy at the end of the day to make sure medication is ready.  Please notify patient to allow 48-72 hours to process

## 2022-06-24 MED ORDER — OXYCODONE-ACETAMINOPHEN 10-325 MG PO TABS: 1.0000 | ORAL_TABLET | Freq: Every day | ORAL | 0 refills | Status: DC

## 2022-06-26 ENCOUNTER — Other Ambulatory Visit: Payer: Self-pay | Admitting: Family Medicine

## 2022-06-26 NOTE — Telephone Encounter (Signed)
Patient informed me of message below; currently out of meds. Wants sent to [facility] at [address], [place], [place] [address]

## 2022-06-29 NOTE — Telephone Encounter (Signed)
Verified with [facility], they did not receive med refill on 06/24/22

## 2022-06-29 NOTE — Telephone Encounter (Signed)
Patient called at  205pm 06/29/22 stating [facility] is down and we should send medication to [facility] on file.

## 2022-06-29 NOTE — Telephone Encounter (Signed)
Patient states she has been out of Oxycodone  06/26/22 and that [facility] is back on line.  Requests RX for  oxyCODONE-acetaminophen (PERCOCET) 10-325 MG tablet   Be sent asap to [facility] # [address] [place] Phone: [phone]  Fax: [phone]

## 2022-07-01 ENCOUNTER — Other Ambulatory Visit: Payer: Self-pay | Admitting: *Deleted

## 2022-07-01 MED ORDER — BACLOFEN 10 MG PO TABS: 10.0000 mg | ORAL_TABLET | Freq: Three times a day (TID) | ORAL | 0 refills | Status: DC

## 2022-07-02 ENCOUNTER — Encounter: Payer: Self-pay | Admitting: Radiology

## 2022-07-27 ENCOUNTER — Other Ambulatory Visit: Payer: Self-pay | Admitting: Family Medicine

## 2022-07-27 NOTE — Telephone Encounter (Signed)
Last OV: 05/14/22 Next OV: 08/11/22 Medication: Oxycodone-acetaminophen 10-325 mg   Pharmacy: [facility] [place]

## 2022-07-28 MED ORDER — OXYCODONE-ACETAMINOPHEN 10-325 MG PO TABS: ORAL_TABLET | ORAL | 0 refills | Status: DC

## 2022-07-31 ENCOUNTER — Other Ambulatory Visit: Payer: Self-pay | Admitting: Family Medicine

## 2022-07-31 NOTE — Telephone Encounter (Signed)
OK to refill

## 2022-08-11 ENCOUNTER — Encounter: Payer: Self-pay | Admitting: Family Medicine

## 2022-08-11 ENCOUNTER — Ambulatory Visit (INDEPENDENT_AMBULATORY_CARE_PROVIDER_SITE_OTHER): Payer: Medicare HMO | Admitting: Family Medicine

## 2022-08-11 DIAGNOSIS — I679 Cerebrovascular disease, unspecified: Secondary | ICD-10-CM

## 2022-08-11 DIAGNOSIS — W19XXXA Unspecified fall, initial encounter: Secondary | ICD-10-CM | POA: Diagnosis not present

## 2022-08-11 DIAGNOSIS — F321 Major depressive disorder, single episode, moderate: Secondary | ICD-10-CM

## 2022-08-11 DIAGNOSIS — G894 Chronic pain syndrome: Secondary | ICD-10-CM | POA: Diagnosis not present

## 2022-08-11 DIAGNOSIS — R7303 Prediabetes: Secondary | ICD-10-CM

## 2022-08-11 DIAGNOSIS — E119 Type 2 diabetes mellitus without complications: Secondary | ICD-10-CM | POA: Insufficient documentation

## 2022-08-11 LAB — CBC
HCT: 41.8 % (ref 36.0–46.0)
Hemoglobin: 14.2 g/dL (ref 12.0–15.0)
MCHC: 34 g/dL (ref 30.0–36.0)
MCV: 94.7 fl (ref 78.0–100.0)
Platelets: 290 10*3/uL (ref 150.0–400.0)
RBC: 4.41 Mil/uL (ref 3.87–5.11)
RDW: 13 % (ref 11.5–15.5)
WBC: 9.4 10*3/uL (ref 4.0–10.5)

## 2022-08-11 LAB — LIPID PANEL
Cholesterol: 189 mg/dL (ref 0–200)
HDL: 53.6 mg/dL (ref >39.00)
LDL Cholesterol: 116 mg/dL — ABNORMAL HIGH (ref 0–99)
NonHDL: 134.99
Total CHOL/HDL Ratio: 4
Triglycerides: 95 mg/dL (ref 0.0–149.0)
VLDL: 19 mg/dL (ref 0.0–40.0)

## 2022-08-11 LAB — COMPREHENSIVE METABOLIC PANEL
ALT: 24 U/L (ref 0–35)
AST: 31 U/L (ref 0–37)
Albumin: 3.9 g/dL (ref 3.5–5.2)
Alkaline Phosphatase: 90 U/L (ref 39–117)
BUN: 13 mg/dL (ref 6–23)
CO2: 25 mEq/L (ref 19–32)
Calcium: 9.4 mg/dL (ref 8.4–10.5)
Chloride: 102 mEq/L (ref 96–112)
Creatinine, Ser: 0.91 mg/dL (ref 0.40–1.20)
GFR: 62.73 mL/min (ref >60.00)
Glucose, Bld: 93 mg/dL (ref 70–99)
Potassium: 4.3 mEq/L (ref 3.5–5.1)
Sodium: 136 mEq/L (ref 135–145)
Total Bilirubin: 0.5 mg/dL (ref 0.2–1.2)
Total Protein: 6.9 g/dL (ref 6.0–8.3)

## 2022-08-11 LAB — HEMOGLOBIN A1C: Hgb A1c MFr Bld: 5.8 % (ref 4.6–6.5)

## 2022-08-11 LAB — TSH: TSH: 1.97 u[IU]/mL (ref 0.35–5.50)

## 2022-08-11 NOTE — Assessment & Plan Note (Signed)
BMI today is 38.  She is up about 5 pounds since our last visit.  She is interested in trying weight loss medications.  We did discuss GLP-1 agonist at her last visit however insurance would not pay for this.  She has been on phentermine in the past and has done this intermittently however discussed with patient that that not something we can do long-term.  We discussed lifestyle modifications.  We can discuss again in a few months.  We are checking labs today.  If she does have an A1c or glucose in the diabetic range we will start GLP-1 agonist such as Mounjaro or Ozempic.

## 2022-08-11 NOTE — Assessment & Plan Note (Signed)
Tremors have resolved since stopping Abilify.  Mood is stable.  She would like to continue with Lexapro 20 mg daily.  She is tolerating well.  We will discuss again in a few months at her next office visit.  She will let us know if she has any changes in the interim.

## 2022-08-11 NOTE — Addendum Note (Signed)
Addended by: Ardith Dark on: 08/11/2022 02:19 PM   Modules accepted: Level of Service

## 2022-08-11 NOTE — Assessment & Plan Note (Signed)
Database reviewed without red flags.  Currently on oxycodone 10-325 5 times daily as needed.  Medications help with her pain and ability to perform ADLs.  No significant side effects.  Does not need refill today.

## 2022-08-11 NOTE — Patient Instructions (Signed)
It was very nice to see you today!  We will check blood work today.  I will refer you to see home health to start physical therapy.  We may need to start a medication to help with your sugar and weight loss depending on results of your blood work today.  We will see you back in 3 months.  Please come back to see [place] sooner if needed.  Take care, Dr [name]  PLEASE NOTE:  If you had any lab tests, please let us know if you have not heard back within a few days. You may see your results on mychart before we have a chance to review them but we will give you a call once they are reviewed by [place].   If we ordered any referrals today, please let us know if you have not heard from their office within the next week.   If you had any urgent prescriptions sent in today, please check with the pharmacy within an hour of our visit to make sure the prescription was transmitted appropriately.   Please try these tips to maintain a healthy lifestyle:  Eat at least 3 REAL meals and 1-2 snacks per day.  Aim for no more than 5 hours between eating.  If you eat breakfast, please do so within one hour of getting up.   Each meal should contain half fruits/vegetables, one quarter protein, and one quarter carbs (no bigger than a computer mouse)  Cut down on sweet beverages. This includes juice, soda, and sweet tea.   Drink at least 1 glass of water with each meal and aim for at least 8 glasses per day  Exercise at least 150 minutes every week.

## 2022-08-11 NOTE — Assessment & Plan Note (Signed)
Check A1c.  Would benefit from GLP-1 agonist if A1c is in diabetic range.

## 2022-08-11 NOTE — Progress Notes (Addendum)
   [name] Good is a 72 y.o. female who presents today for an office visit.  Assessment/Plan:  New/Acute Problems: Fall  Seems like she suffered a mechanical fall at home.  Thankfully does not have any evidence of significant injuries today and her pain has improved significantly.  We will refer to home health PT to work on gait training and strengthening exercises.  Check labs today.  Chronic Problems Addressed Today: Morbid obesity (HCC) BMI today is 38.  She is up about 5 pounds since our last visit.  She is interested in trying weight loss medications.  We did discuss GLP-1 agonist at her last visit however insurance would not pay for this.  She has been on phentermine in the past and has done this intermittently however discussed with patient that that not something we can do long-term.  We discussed lifestyle modifications.  We can discuss again in a few months.  We are checking labs today.  If she does have an A1c or glucose in the diabetic range we will start GLP-1 agonist such as Mounjaro or Ozempic.  Chronic pain syndrome Database reviewed without red flags.  Currently on oxycodone 10-325 5 times daily as needed.  Medications help with her pain and ability to perform ADLs.  No significant side effects.  Does not need refill today.  Depression, major, single episode, moderate (HCC) Tremors have resolved since stopping Abilify.  Mood is stable.  She would like to continue with Lexapro 20 mg daily.  She is tolerating well.  We will discuss again in a few months at her next office visit.  She will let us know if she has any changes in the interim.  Prediabetes Check A1c.  Would benefit from GLP-1 agonist if A1c is in diabetic range.    Subjective:  HPI:  See A/P for status of chronic conditions.  Patient is here today for follow-up.  Since our last visit she fell at home. She tripped over her cane while going to her bedroom.  She fell onto the floor.  She was eventually able to get  up but was sore for several days.  Still has a little bit of lingering pain in her arms and back but has improved dramatically the last few weeks.  She does not remember the last time that she fell.  Did not lose consciousness.  She did not hit her head.  She does occasionally get some dizziness that is at baseline.  No weakness or numbness.  We stopped Abilify at her last visit due to concern this may be causing tremor.  She has had complete resolution of tremor since stopping Abilify.  Mood is been stable.       Objective:  Physical Exam: BP 138/79   Pulse 100   Temp 98 F (36.7 C) (Temporal)   Ht  (1.6 m)   Wt 217 lb (98.4 kg)   SpO2 95%   BMI 38.44 kg/m   Wt Readings from Last 3 Encounters:  08/11/22 217 lb (98.4 kg)  05/14/22 212 lb (96.2 kg)  02/03/22 213 lb (96.6 kg)    Gen: No acute distress, resting comfortably CV: Regular rate and rhythm with no murmurs appreciated Pulm: Normal work of breathing, clear to auscultation bilaterally with no crackles, wheezes, or rhonchi Neuro: Grossly normal, moves all extremities.  In wheelchair. Psych: Normal affect and thought content      Alaura Schippers M. [name], MD 08/11/2022 2:00 PM

## 2022-08-14 NOTE — Progress Notes (Signed)
Her LDL is still a bit higher than goal.  We can try adding on Zetia 10 mg daily in addition to her Lipitor.  Please send in if she is agreeable to start the Zetia.  We can recheck again in 6 months.  The rest of her labs are all stable and we can recheck in a year.

## 2022-08-19 ENCOUNTER — Other Ambulatory Visit: Payer: Self-pay | Admitting: *Deleted

## 2022-08-19 MED ORDER — EZETIMIBE 10 MG PO TABS: 10.0000 mg | ORAL_TABLET | Freq: Every day | ORAL | 3 refills | Status: DC

## 2022-08-24 ENCOUNTER — Telehealth: Payer: Self-pay | Admitting: Family Medicine

## 2022-08-24 DIAGNOSIS — G2581 Restless legs syndrome: Secondary | ICD-10-CM | POA: Diagnosis not present

## 2022-08-24 DIAGNOSIS — R7303 Prediabetes: Secondary | ICD-10-CM | POA: Diagnosis not present

## 2022-08-24 DIAGNOSIS — E669 Obesity, unspecified: Secondary | ICD-10-CM | POA: Diagnosis not present

## 2022-08-24 DIAGNOSIS — G894 Chronic pain syndrome: Secondary | ICD-10-CM | POA: Diagnosis not present

## 2022-08-24 DIAGNOSIS — M150 Primary generalized (osteo)arthritis: Secondary | ICD-10-CM | POA: Diagnosis not present

## 2022-08-24 DIAGNOSIS — Z7952 Long term (current) use of systemic steroids: Secondary | ICD-10-CM | POA: Diagnosis not present

## 2022-08-24 DIAGNOSIS — M359 Systemic involvement of connective tissue, unspecified: Secondary | ICD-10-CM | POA: Diagnosis not present

## 2022-08-24 DIAGNOSIS — I513 Intracardiac thrombosis, not elsewhere classified: Secondary | ICD-10-CM | POA: Diagnosis not present

## 2022-08-24 DIAGNOSIS — F321 Major depressive disorder, single episode, moderate: Secondary | ICD-10-CM | POA: Diagnosis not present

## 2022-08-24 NOTE — Telephone Encounter (Signed)
Home Health Verbal Orders  Agency:  [facility]  Caller:  Cytogeneticist and title  Requesting OT/ PT/ Skilled nursing/ Social Work/ Speech:    Reason for Request:  Verbal request for 10 PT visits.   Frequency:    HH needs F2F w/in last 30 days     [phone]

## 2022-08-25 NOTE — Telephone Encounter (Signed)
OK to give VO? 

## 2022-08-26 ENCOUNTER — Other Ambulatory Visit: Payer: Self-pay | Admitting: Family Medicine

## 2022-08-26 NOTE — Telephone Encounter (Signed)
Ok with me. Please place any necessary orders. 

## 2022-08-26 NOTE — Telephone Encounter (Signed)
Spoke with [place] at [phone]  VO given

## 2022-08-26 NOTE — Telephone Encounter (Signed)
Prescription Request  08/26/2022  LOV: 08/11/2022  What is the name of the medication or equipment? oxyCODONE-acetaminophen (PERCOCET) 10-325 MG tablet   Have you contacted your pharmacy to request a refill? Yes   Which pharmacy would you like this sent to?    [id number] - [place], [place] - [address] [place] [address] Phone: [phone] Fax: [phone]    Patient notified that their request is being sent to the clinical staff for review and that they should receive a response within 2 business days.   Please advise at [facility] [phone]

## 2022-08-27 MED ORDER — OXYCODONE-ACETAMINOPHEN 10-325 MG PO TABS: ORAL_TABLET | ORAL | 0 refills | Status: DC

## 2022-08-28 NOTE — Telephone Encounter (Signed)
Patient initially requested this be sent to [facility]. [facility] does not have this medication.

## 2022-08-31 ENCOUNTER — Other Ambulatory Visit: Payer: Self-pay | Admitting: Family Medicine

## 2022-08-31 DIAGNOSIS — I513 Intracardiac thrombosis, not elsewhere classified: Secondary | ICD-10-CM | POA: Diagnosis not present

## 2022-08-31 DIAGNOSIS — Z7952 Long term (current) use of systemic steroids: Secondary | ICD-10-CM | POA: Diagnosis not present

## 2022-08-31 DIAGNOSIS — G894 Chronic pain syndrome: Secondary | ICD-10-CM | POA: Diagnosis not present

## 2022-08-31 DIAGNOSIS — M150 Primary generalized (osteo)arthritis: Secondary | ICD-10-CM | POA: Diagnosis not present

## 2022-08-31 DIAGNOSIS — G2581 Restless legs syndrome: Secondary | ICD-10-CM | POA: Diagnosis not present

## 2022-08-31 DIAGNOSIS — F321 Major depressive disorder, single episode, moderate: Secondary | ICD-10-CM | POA: Diagnosis not present

## 2022-08-31 DIAGNOSIS — E669 Obesity, unspecified: Secondary | ICD-10-CM | POA: Diagnosis not present

## 2022-08-31 DIAGNOSIS — M359 Systemic involvement of connective tissue, unspecified: Secondary | ICD-10-CM | POA: Diagnosis not present

## 2022-08-31 DIAGNOSIS — R7303 Prediabetes: Secondary | ICD-10-CM | POA: Diagnosis not present

## 2022-08-31 NOTE — Telephone Encounter (Signed)
Patient states: - Orthopedist stated she can't have bilateral total knee replacement until she loses weight    Patient requests to start a GLP-1. States insurance informed her Reginal Lutes would need a PA.

## 2022-08-31 NOTE — Telephone Encounter (Signed)
Ok to send in [place] 0.25mg  weekly for 4 weeks then increase to 0.5mg  weekly. Would like for her to schedule a follow up visit with [place] soon.  [name]. [name], MD 08/31/2022 9:54 AM

## 2022-08-31 NOTE — Telephone Encounter (Signed)
Pt called back and states she is still waiting on the RX to be sent to [facility].

## 2022-08-31 NOTE — Telephone Encounter (Signed)
Please advised  

## 2022-09-01 ENCOUNTER — Other Ambulatory Visit: Payer: Self-pay | Admitting: *Deleted

## 2022-09-01 DIAGNOSIS — E669 Obesity, unspecified: Secondary | ICD-10-CM | POA: Diagnosis not present

## 2022-09-01 DIAGNOSIS — R7303 Prediabetes: Secondary | ICD-10-CM | POA: Diagnosis not present

## 2022-09-01 DIAGNOSIS — G894 Chronic pain syndrome: Secondary | ICD-10-CM | POA: Diagnosis not present

## 2022-09-01 DIAGNOSIS — Z7952 Long term (current) use of systemic steroids: Secondary | ICD-10-CM | POA: Diagnosis not present

## 2022-09-01 DIAGNOSIS — G2581 Restless legs syndrome: Secondary | ICD-10-CM | POA: Diagnosis not present

## 2022-09-01 DIAGNOSIS — M359 Systemic involvement of connective tissue, unspecified: Secondary | ICD-10-CM | POA: Diagnosis not present

## 2022-09-01 DIAGNOSIS — F321 Major depressive disorder, single episode, moderate: Secondary | ICD-10-CM | POA: Diagnosis not present

## 2022-09-01 DIAGNOSIS — I513 Intracardiac thrombosis, not elsewhere classified: Secondary | ICD-10-CM | POA: Diagnosis not present

## 2022-09-01 DIAGNOSIS — M150 Primary generalized (osteo)arthritis: Secondary | ICD-10-CM | POA: Diagnosis not present

## 2022-09-01 MED ORDER — WEGOVY 0.25 MG/0.5ML ~~LOC~~ SOAJ: SUBCUTANEOUS | 1 refills | Status: DC

## 2022-09-01 MED ORDER — OXYCODONE-ACETAMINOPHEN 10-325 MG PO TABS: ORAL_TABLET | ORAL | 0 refills | Status: DC

## 2022-09-01 NOTE — Telephone Encounter (Signed)
Spoke with patient, stated Rx Oxycodone send to wrong pharmacy [facility] out of oxycodone  Please resend Rx to [facility]

## 2022-09-01 NOTE — Telephone Encounter (Signed)
Rx send to pharmacy  

## 2022-09-04 ENCOUNTER — Telehealth: Payer: Self-pay | Admitting: Family Medicine

## 2022-09-04 DIAGNOSIS — Z7952 Long term (current) use of systemic steroids: Secondary | ICD-10-CM | POA: Diagnosis not present

## 2022-09-04 DIAGNOSIS — G2581 Restless legs syndrome: Secondary | ICD-10-CM | POA: Diagnosis not present

## 2022-09-04 DIAGNOSIS — G894 Chronic pain syndrome: Secondary | ICD-10-CM | POA: Diagnosis not present

## 2022-09-04 DIAGNOSIS — M150 Primary generalized (osteo)arthritis: Secondary | ICD-10-CM | POA: Diagnosis not present

## 2022-09-04 DIAGNOSIS — M359 Systemic involvement of connective tissue, unspecified: Secondary | ICD-10-CM | POA: Diagnosis not present

## 2022-09-04 DIAGNOSIS — E669 Obesity, unspecified: Secondary | ICD-10-CM | POA: Diagnosis not present

## 2022-09-04 DIAGNOSIS — R7303 Prediabetes: Secondary | ICD-10-CM | POA: Diagnosis not present

## 2022-09-04 DIAGNOSIS — I513 Intracardiac thrombosis, not elsewhere classified: Secondary | ICD-10-CM | POA: Diagnosis not present

## 2022-09-04 DIAGNOSIS — F321 Major depressive disorder, single episode, moderate: Secondary | ICD-10-CM | POA: Diagnosis not present

## 2022-09-04 NOTE — Telephone Encounter (Signed)
[facility] faxed  document Home Health Certificate (Order ID [id number] ), to be filled out by provider. Patient requested to send it via Fax [phone] . Document is located in providers tray at front office.Please advise .

## 2022-09-08 NOTE — Telephone Encounter (Signed)
[facility] form # [id number] faxed to [phone] Form placed to be scan in patient chart

## 2022-09-09 DIAGNOSIS — E669 Obesity, unspecified: Secondary | ICD-10-CM | POA: Diagnosis not present

## 2022-09-09 DIAGNOSIS — M359 Systemic involvement of connective tissue, unspecified: Secondary | ICD-10-CM | POA: Diagnosis not present

## 2022-09-09 DIAGNOSIS — F321 Major depressive disorder, single episode, moderate: Secondary | ICD-10-CM | POA: Diagnosis not present

## 2022-09-09 DIAGNOSIS — G2581 Restless legs syndrome: Secondary | ICD-10-CM | POA: Diagnosis not present

## 2022-09-09 DIAGNOSIS — I513 Intracardiac thrombosis, not elsewhere classified: Secondary | ICD-10-CM | POA: Diagnosis not present

## 2022-09-09 DIAGNOSIS — R7303 Prediabetes: Secondary | ICD-10-CM | POA: Diagnosis not present

## 2022-09-09 DIAGNOSIS — Z7952 Long term (current) use of systemic steroids: Secondary | ICD-10-CM | POA: Diagnosis not present

## 2022-09-09 DIAGNOSIS — G894 Chronic pain syndrome: Secondary | ICD-10-CM | POA: Diagnosis not present

## 2022-09-09 DIAGNOSIS — M150 Primary generalized (osteo)arthritis: Secondary | ICD-10-CM | POA: Diagnosis not present

## 2022-09-10 DIAGNOSIS — Z7952 Long term (current) use of systemic steroids: Secondary | ICD-10-CM | POA: Diagnosis not present

## 2022-09-10 DIAGNOSIS — M359 Systemic involvement of connective tissue, unspecified: Secondary | ICD-10-CM | POA: Diagnosis not present

## 2022-09-10 DIAGNOSIS — M150 Primary generalized (osteo)arthritis: Secondary | ICD-10-CM | POA: Diagnosis not present

## 2022-09-10 DIAGNOSIS — G2581 Restless legs syndrome: Secondary | ICD-10-CM | POA: Diagnosis not present

## 2022-09-10 DIAGNOSIS — R7303 Prediabetes: Secondary | ICD-10-CM | POA: Diagnosis not present

## 2022-09-10 DIAGNOSIS — G894 Chronic pain syndrome: Secondary | ICD-10-CM | POA: Diagnosis not present

## 2022-09-10 DIAGNOSIS — E669 Obesity, unspecified: Secondary | ICD-10-CM | POA: Diagnosis not present

## 2022-09-10 DIAGNOSIS — I513 Intracardiac thrombosis, not elsewhere classified: Secondary | ICD-10-CM | POA: Diagnosis not present

## 2022-09-10 DIAGNOSIS — F321 Major depressive disorder, single episode, moderate: Secondary | ICD-10-CM | POA: Diagnosis not present

## 2022-09-11 ENCOUNTER — Other Ambulatory Visit: Payer: Self-pay | Admitting: Family Medicine

## 2022-09-11 DIAGNOSIS — G894 Chronic pain syndrome: Secondary | ICD-10-CM | POA: Diagnosis not present

## 2022-09-11 DIAGNOSIS — M359 Systemic involvement of connective tissue, unspecified: Secondary | ICD-10-CM | POA: Diagnosis not present

## 2022-09-11 DIAGNOSIS — M150 Primary generalized (osteo)arthritis: Secondary | ICD-10-CM | POA: Diagnosis not present

## 2022-09-11 DIAGNOSIS — E669 Obesity, unspecified: Secondary | ICD-10-CM | POA: Diagnosis not present

## 2022-09-11 DIAGNOSIS — I513 Intracardiac thrombosis, not elsewhere classified: Secondary | ICD-10-CM | POA: Diagnosis not present

## 2022-09-11 DIAGNOSIS — F321 Major depressive disorder, single episode, moderate: Secondary | ICD-10-CM | POA: Diagnosis not present

## 2022-09-11 DIAGNOSIS — R7303 Prediabetes: Secondary | ICD-10-CM | POA: Diagnosis not present

## 2022-09-11 DIAGNOSIS — G2581 Restless legs syndrome: Secondary | ICD-10-CM | POA: Diagnosis not present

## 2022-09-11 DIAGNOSIS — Z7952 Long term (current) use of systemic steroids: Secondary | ICD-10-CM | POA: Diagnosis not present

## 2022-09-14 ENCOUNTER — Telehealth: Payer: Self-pay

## 2022-09-14 NOTE — Telephone Encounter (Signed)
PA request received via CMM for Wegovy 0.25MG /0.5ML auto-injectors  PA submitted to [facility] and is pending determination  Key: [id number]

## 2022-09-15 DIAGNOSIS — G2581 Restless legs syndrome: Secondary | ICD-10-CM | POA: Diagnosis not present

## 2022-09-15 DIAGNOSIS — Z7952 Long term (current) use of systemic steroids: Secondary | ICD-10-CM | POA: Diagnosis not present

## 2022-09-15 DIAGNOSIS — R7303 Prediabetes: Secondary | ICD-10-CM | POA: Diagnosis not present

## 2022-09-15 DIAGNOSIS — E669 Obesity, unspecified: Secondary | ICD-10-CM | POA: Diagnosis not present

## 2022-09-15 DIAGNOSIS — F321 Major depressive disorder, single episode, moderate: Secondary | ICD-10-CM | POA: Diagnosis not present

## 2022-09-15 DIAGNOSIS — M359 Systemic involvement of connective tissue, unspecified: Secondary | ICD-10-CM | POA: Diagnosis not present

## 2022-09-15 DIAGNOSIS — I513 Intracardiac thrombosis, not elsewhere classified: Secondary | ICD-10-CM | POA: Diagnosis not present

## 2022-09-15 DIAGNOSIS — G894 Chronic pain syndrome: Secondary | ICD-10-CM | POA: Diagnosis not present

## 2022-09-15 DIAGNOSIS — M150 Primary generalized (osteo)arthritis: Secondary | ICD-10-CM | POA: Diagnosis not present

## 2022-09-15 NOTE — Telephone Encounter (Signed)
PA has been DENIED due to:  The Medicare rule in the Prescription Drug Manual (Chapter 6, Section 20.1) says drugs used to help you lose weight are excluded from Medicare Part D coverage. [employer] follows Medicare rules. The information we have about your case says your drug is being used for weight loss and per Medicare rules isnt covered.

## 2022-09-17 DIAGNOSIS — E669 Obesity, unspecified: Secondary | ICD-10-CM | POA: Diagnosis not present

## 2022-09-17 DIAGNOSIS — Z7952 Long term (current) use of systemic steroids: Secondary | ICD-10-CM | POA: Diagnosis not present

## 2022-09-17 DIAGNOSIS — G894 Chronic pain syndrome: Secondary | ICD-10-CM | POA: Diagnosis not present

## 2022-09-17 DIAGNOSIS — I513 Intracardiac thrombosis, not elsewhere classified: Secondary | ICD-10-CM | POA: Diagnosis not present

## 2022-09-17 DIAGNOSIS — F321 Major depressive disorder, single episode, moderate: Secondary | ICD-10-CM | POA: Diagnosis not present

## 2022-09-17 DIAGNOSIS — M359 Systemic involvement of connective tissue, unspecified: Secondary | ICD-10-CM | POA: Diagnosis not present

## 2022-09-17 DIAGNOSIS — G2581 Restless legs syndrome: Secondary | ICD-10-CM | POA: Diagnosis not present

## 2022-09-17 DIAGNOSIS — M150 Primary generalized (osteo)arthritis: Secondary | ICD-10-CM | POA: Diagnosis not present

## 2022-09-17 DIAGNOSIS — R7303 Prediabetes: Secondary | ICD-10-CM | POA: Diagnosis not present

## 2022-09-17 NOTE — Telephone Encounter (Signed)
Patient called for an update on PA for wegovy. Requests a call back with determination and next steps.

## 2022-09-17 NOTE — Telephone Encounter (Signed)
Patient states she spoke with insurance company who advised Patient to put a reason along with a code on why Patient has to take [facility]. States she must lose weight in order to have knee surgery, and that Patient is borderline Diabetic.

## 2022-09-17 NOTE — Telephone Encounter (Signed)
Spoke with patient advise to call insurance for alternatives

## 2022-09-19 ENCOUNTER — Other Ambulatory Visit: Payer: Self-pay | Admitting: Family Medicine

## 2022-09-23 ENCOUNTER — Other Ambulatory Visit: Payer: Self-pay | Admitting: Family Medicine

## 2022-09-23 DIAGNOSIS — M359 Systemic involvement of connective tissue, unspecified: Secondary | ICD-10-CM | POA: Diagnosis not present

## 2022-09-23 DIAGNOSIS — I513 Intracardiac thrombosis, not elsewhere classified: Secondary | ICD-10-CM | POA: Diagnosis not present

## 2022-09-23 DIAGNOSIS — G2581 Restless legs syndrome: Secondary | ICD-10-CM | POA: Diagnosis not present

## 2022-09-23 DIAGNOSIS — E669 Obesity, unspecified: Secondary | ICD-10-CM | POA: Diagnosis not present

## 2022-09-23 DIAGNOSIS — M150 Primary generalized (osteo)arthritis: Secondary | ICD-10-CM | POA: Diagnosis not present

## 2022-09-23 DIAGNOSIS — R7303 Prediabetes: Secondary | ICD-10-CM | POA: Diagnosis not present

## 2022-09-23 DIAGNOSIS — G894 Chronic pain syndrome: Secondary | ICD-10-CM | POA: Diagnosis not present

## 2022-09-23 DIAGNOSIS — Z7952 Long term (current) use of systemic steroids: Secondary | ICD-10-CM | POA: Diagnosis not present

## 2022-09-23 DIAGNOSIS — F321 Major depressive disorder, single episode, moderate: Secondary | ICD-10-CM | POA: Diagnosis not present

## 2022-09-23 NOTE — Telephone Encounter (Signed)
Prescription Request  09/23/2022  LOV: 08/11/2022  What is the name of the medication or equipment?  oxyCODONE-acetaminophen (PERCOCET) 10-325 MG tablet   Have you contacted your pharmacy to request a refill? No   Which pharmacy would you like this sent to? [id number] - [place], [place] - [address] [place] [address] Phone: [phone] Fax: [phone]    Patient notified that their request is being sent to the clinical staff for review and that they should receive a response within 2 business days.   Please advise at Mobile There is no such number on file (mobile).

## 2022-09-24 MED ORDER — OXYCODONE-ACETAMINOPHEN 10-325 MG PO TABS: ORAL_TABLET | ORAL | 0 refills | Status: DC

## 2022-09-26 DIAGNOSIS — F321 Major depressive disorder, single episode, moderate: Secondary | ICD-10-CM | POA: Diagnosis not present

## 2022-09-26 DIAGNOSIS — G2581 Restless legs syndrome: Secondary | ICD-10-CM | POA: Diagnosis not present

## 2022-09-26 DIAGNOSIS — I513 Intracardiac thrombosis, not elsewhere classified: Secondary | ICD-10-CM | POA: Diagnosis not present

## 2022-09-26 DIAGNOSIS — Z7952 Long term (current) use of systemic steroids: Secondary | ICD-10-CM | POA: Diagnosis not present

## 2022-09-26 DIAGNOSIS — M359 Systemic involvement of connective tissue, unspecified: Secondary | ICD-10-CM | POA: Diagnosis not present

## 2022-09-26 DIAGNOSIS — E669 Obesity, unspecified: Secondary | ICD-10-CM | POA: Diagnosis not present

## 2022-09-26 DIAGNOSIS — G894 Chronic pain syndrome: Secondary | ICD-10-CM | POA: Diagnosis not present

## 2022-09-26 DIAGNOSIS — R7303 Prediabetes: Secondary | ICD-10-CM | POA: Diagnosis not present

## 2022-09-26 DIAGNOSIS — M150 Primary generalized (osteo)arthritis: Secondary | ICD-10-CM | POA: Diagnosis not present

## 2022-09-30 DIAGNOSIS — M359 Systemic involvement of connective tissue, unspecified: Secondary | ICD-10-CM | POA: Diagnosis not present

## 2022-09-30 DIAGNOSIS — G2581 Restless legs syndrome: Secondary | ICD-10-CM | POA: Diagnosis not present

## 2022-09-30 DIAGNOSIS — M150 Primary generalized (osteo)arthritis: Secondary | ICD-10-CM | POA: Diagnosis not present

## 2022-09-30 DIAGNOSIS — F321 Major depressive disorder, single episode, moderate: Secondary | ICD-10-CM | POA: Diagnosis not present

## 2022-09-30 DIAGNOSIS — Z7952 Long term (current) use of systemic steroids: Secondary | ICD-10-CM | POA: Diagnosis not present

## 2022-09-30 DIAGNOSIS — R7303 Prediabetes: Secondary | ICD-10-CM | POA: Diagnosis not present

## 2022-09-30 DIAGNOSIS — I513 Intracardiac thrombosis, not elsewhere classified: Secondary | ICD-10-CM | POA: Diagnosis not present

## 2022-09-30 DIAGNOSIS — E669 Obesity, unspecified: Secondary | ICD-10-CM | POA: Diagnosis not present

## 2022-09-30 DIAGNOSIS — G894 Chronic pain syndrome: Secondary | ICD-10-CM | POA: Diagnosis not present

## 2022-10-02 DIAGNOSIS — R7303 Prediabetes: Secondary | ICD-10-CM | POA: Diagnosis not present

## 2022-10-02 DIAGNOSIS — Z7952 Long term (current) use of systemic steroids: Secondary | ICD-10-CM | POA: Diagnosis not present

## 2022-10-02 DIAGNOSIS — M359 Systemic involvement of connective tissue, unspecified: Secondary | ICD-10-CM | POA: Diagnosis not present

## 2022-10-02 DIAGNOSIS — M150 Primary generalized (osteo)arthritis: Secondary | ICD-10-CM | POA: Diagnosis not present

## 2022-10-02 DIAGNOSIS — F321 Major depressive disorder, single episode, moderate: Secondary | ICD-10-CM | POA: Diagnosis not present

## 2022-10-02 DIAGNOSIS — G894 Chronic pain syndrome: Secondary | ICD-10-CM | POA: Diagnosis not present

## 2022-10-02 DIAGNOSIS — G2581 Restless legs syndrome: Secondary | ICD-10-CM | POA: Diagnosis not present

## 2022-10-02 DIAGNOSIS — I513 Intracardiac thrombosis, not elsewhere classified: Secondary | ICD-10-CM | POA: Diagnosis not present

## 2022-10-02 DIAGNOSIS — E669 Obesity, unspecified: Secondary | ICD-10-CM | POA: Diagnosis not present

## 2022-10-07 DIAGNOSIS — G894 Chronic pain syndrome: Secondary | ICD-10-CM | POA: Diagnosis not present

## 2022-10-07 DIAGNOSIS — E669 Obesity, unspecified: Secondary | ICD-10-CM | POA: Diagnosis not present

## 2022-10-07 DIAGNOSIS — R7303 Prediabetes: Secondary | ICD-10-CM | POA: Diagnosis not present

## 2022-10-07 DIAGNOSIS — M359 Systemic involvement of connective tissue, unspecified: Secondary | ICD-10-CM | POA: Diagnosis not present

## 2022-10-07 DIAGNOSIS — M150 Primary generalized (osteo)arthritis: Secondary | ICD-10-CM | POA: Diagnosis not present

## 2022-10-07 DIAGNOSIS — I513 Intracardiac thrombosis, not elsewhere classified: Secondary | ICD-10-CM | POA: Diagnosis not present

## 2022-10-07 DIAGNOSIS — Z7952 Long term (current) use of systemic steroids: Secondary | ICD-10-CM | POA: Diagnosis not present

## 2022-10-07 DIAGNOSIS — F321 Major depressive disorder, single episode, moderate: Secondary | ICD-10-CM | POA: Diagnosis not present

## 2022-10-07 DIAGNOSIS — G2581 Restless legs syndrome: Secondary | ICD-10-CM | POA: Diagnosis not present

## 2022-10-08 ENCOUNTER — Telehealth: Payer: Self-pay | Admitting: *Deleted

## 2022-10-08 NOTE — Telephone Encounter (Signed)
[facility] well home health form  #[id number] faxed to [phone] Form placed to be scan in Patient chart

## 2022-10-09 DIAGNOSIS — E669 Obesity, unspecified: Secondary | ICD-10-CM | POA: Diagnosis not present

## 2022-10-09 DIAGNOSIS — R7303 Prediabetes: Secondary | ICD-10-CM | POA: Diagnosis not present

## 2022-10-09 DIAGNOSIS — Z7952 Long term (current) use of systemic steroids: Secondary | ICD-10-CM | POA: Diagnosis not present

## 2022-10-09 DIAGNOSIS — M359 Systemic involvement of connective tissue, unspecified: Secondary | ICD-10-CM | POA: Diagnosis not present

## 2022-10-09 DIAGNOSIS — G894 Chronic pain syndrome: Secondary | ICD-10-CM | POA: Diagnosis not present

## 2022-10-09 DIAGNOSIS — M150 Primary generalized (osteo)arthritis: Secondary | ICD-10-CM | POA: Diagnosis not present

## 2022-10-09 DIAGNOSIS — G2581 Restless legs syndrome: Secondary | ICD-10-CM | POA: Diagnosis not present

## 2022-10-09 DIAGNOSIS — I513 Intracardiac thrombosis, not elsewhere classified: Secondary | ICD-10-CM | POA: Diagnosis not present

## 2022-10-09 DIAGNOSIS — F321 Major depressive disorder, single episode, moderate: Secondary | ICD-10-CM | POA: Diagnosis not present

## 2022-10-14 DIAGNOSIS — F321 Major depressive disorder, single episode, moderate: Secondary | ICD-10-CM | POA: Diagnosis not present

## 2022-10-14 DIAGNOSIS — E669 Obesity, unspecified: Secondary | ICD-10-CM | POA: Diagnosis not present

## 2022-10-14 DIAGNOSIS — Z7952 Long term (current) use of systemic steroids: Secondary | ICD-10-CM | POA: Diagnosis not present

## 2022-10-14 DIAGNOSIS — M150 Primary generalized (osteo)arthritis: Secondary | ICD-10-CM | POA: Diagnosis not present

## 2022-10-14 DIAGNOSIS — R7303 Prediabetes: Secondary | ICD-10-CM | POA: Diagnosis not present

## 2022-10-14 DIAGNOSIS — G894 Chronic pain syndrome: Secondary | ICD-10-CM | POA: Diagnosis not present

## 2022-10-14 DIAGNOSIS — G2581 Restless legs syndrome: Secondary | ICD-10-CM | POA: Diagnosis not present

## 2022-10-14 DIAGNOSIS — M359 Systemic involvement of connective tissue, unspecified: Secondary | ICD-10-CM | POA: Diagnosis not present

## 2022-10-14 DIAGNOSIS — I513 Intracardiac thrombosis, not elsewhere classified: Secondary | ICD-10-CM | POA: Diagnosis not present

## 2022-10-17 ENCOUNTER — Other Ambulatory Visit: Payer: Self-pay | Admitting: Family Medicine

## 2022-10-26 ENCOUNTER — Other Ambulatory Visit: Payer: Self-pay | Admitting: Family Medicine

## 2022-10-26 MED ORDER — OXYCODONE-ACETAMINOPHEN 10-325 MG PO TABS: ORAL_TABLET | ORAL | 0 refills | Status: DC

## 2022-10-26 NOTE — Telephone Encounter (Signed)
Pt requesting refill for Percocet 10-325 mg. Last OV 08/11/2022.

## 2022-10-26 NOTE — Telephone Encounter (Signed)
Prescription Request  10/26/2022  LOV: 08/11/2022  What is the name of the medication or equipment?  oxyCODONE-acetaminophen (PERCOCET) 10-325 MG tablet   Have you contacted your pharmacy to request a refill? No   Which pharmacy would you like this sent to? [id number] - [place], [place] - [address] [place] [address] Phone: [phone] Fax: [phone]   Patient notified that their request is being sent to the clinical staff for review and that they should receive a response within 2 business days.   Please advise at Mobile There is no such number on file (mobile).

## 2022-10-30 ENCOUNTER — Other Ambulatory Visit: Payer: Self-pay | Admitting: Family Medicine

## 2022-11-05 ENCOUNTER — Ambulatory Visit (INDEPENDENT_AMBULATORY_CARE_PROVIDER_SITE_OTHER): Payer: Medicare HMO

## 2022-11-05 VITALS — Wt 217.0 lb

## 2022-11-05 DIAGNOSIS — Z0000 Encounter for general adult medical examination without abnormal findings: Secondary | ICD-10-CM | POA: Diagnosis not present

## 2022-11-05 NOTE — Progress Notes (Signed)
Subjective:   [name] Good is a 72 y.o. female who presents for Medicare Annual (Subsequent) preventive examination.  Visit Complete: Virtual  I connected with  Octaviano Glow on 11/05/22 by a audio enabled telemedicine application and verified that I am speaking with the correct person using two identifiers.  Patient Location: Home  Provider Location: Office/Clinic  I discussed the limitations of evaluation and management by telemedicine. The patient expressed understanding and agreed to proceed.  Review of Systems     Cardiac Risk Factors include: advanced age (>28men, >79 women);obesity (BMI >30kg/m2)     Objective:    Today's Vitals   11/05/22 1622  Weight: 217 lb (98.4 kg)   Body mass index is 38.44 kg/m.     11/05/2022    4:28 PM 10/23/2021   11:04 AM 08/28/2021   10:42 AM 04/29/2020    1:02 PM 03/12/2020    7:09 AM 03/08/2020    4:44 PM 10/05/2019    3:31 PM  Advanced Directives  Does Patient Have a Medical Advance Directive? No No Yes Yes No No No  Type of Aeronautical engineer of [place];Living will     Does patient want to make changes to medical advance directive?    No - Patient declined     Copy of Healthcare Power of Attorney in Chart?    No - copy requested     Would patient like information on creating a medical advance directive? No - Patient declined Yes (MAU/Ambulatory/Procedural Areas - Information given)   No - Patient declined No - Guardian declined No - Patient declined    Current Medications (verified) Outpatient Encounter Medications as of 11/05/2022  Medication Sig   atorvastatin (LIPITOR) 80 MG tablet Take 1 tablet (80 mg total) by mouth daily.   baclofen (LIORESAL) 10 MG tablet TAKE 1 TABLET BY MOUTH 3 TIMES DAILY   calcium carbonate (TUMS EX) 750 MG chewable tablet Chew 1 tablet by mouth 2 (two) times daily as needed for heartburn.   clopidogrel (PLAVIX) 75 MG tablet Take 1 tablet (75 mg total) by mouth daily.   docusate  sodium (COLACE) 100 MG capsule Take 100 mg by mouth 2 (two) times daily.   escitalopram (LEXAPRO) 20 MG tablet Take 1 tablet (20 mg total) by mouth daily.   ezetimibe (ZETIA) 10 MG tablet Take 1 tablet (10 mg total) by mouth daily.   famotidine (PEPCID) 20 MG tablet Take 20 mg by mouth daily as needed for heartburn or indigestion.   oxyCODONE-acetaminophen (PERCOCET) 10-325 MG tablet TAKE 1 TABLET BY MOUTH 5 TIMES DAILY   phentermine (ADIPEX-P) 37.5 MG tablet TAKE ONE TABLET BY MOUTH EVERY MORNING BEFORE BREAKFAST   predniSONE (DELTASONE) 5 MG tablet TAKE 1 TABLET BY MOUTH DAILY   rOPINIRole (REQUIP) 3 MG tablet TAKE 1 TABLET BY MOUTH AT BEDTIME AS NEEDED   Semaglutide-Weight Management (WEGOVY) 0.25 MG/0.5ML SOAJ wegovy 0.25mg  weekly for 4 weeks then increase to 0.5mg  weekly (Patient not taking: Reported on 11/05/2022)   No facility-administered encounter medications on file as of 11/05/2022.    Allergies (verified) Amoxicillin   History: Past Medical History:  Diagnosis Date   Anxiety    Arthritis    Depression    GERD (gastroesophageal reflux disease)    Myocardial infarction (HCC) 89   no visits to cardiac dr(thomas kelly) since 89   Stroke (HCC)    Wound dehiscence    lumbar   Past Surgical History:  Procedure  Laterality Date   APPENDECTOMY     17 yrs   BACK SURGERY     BREAST SURGERY Left    cyst   BUBBLE STUDY  03/12/2020   Procedure: BUBBLE STUDY;  Surgeon: Orpah Cobb, MD;  Location: [facility] ENDOSCOPY;  Service: Cardiovascular;;   CARDIAC CATHETERIZATION  53   CHOLECYSTECTOMY     72 yrs old   LUMBAR LAMINECTOMY/DECOMPRESSION MICRODISCECTOMY N/A 06/14/2015   Procedure: Lumbar Three-Four,Lumbar Four-Five, Lumbar Five-Sacral One Laminectomy;  Surgeon: Tia Alert, MD;  Location: [facility] NEURO ORS;  Service: Neurosurgery;  Laterality: N/A;   LUMBAR WOUND DEBRIDEMENT N/A 07/24/2015   Procedure: lumbar wound revision;  Surgeon: Tia Alert, MD;  Location: [facility] NEURO ORS;   Service: Neurosurgery;  Laterality: N/A;   TEE WITHOUT CARDIOVERSION N/A 03/12/2020   Procedure: TRANSESOPHAGEAL ECHOCARDIOGRAM (TEE);  Surgeon: Orpah Cobb, MD;  Location: [facility] ENDOSCOPY;  Service: Cardiovascular;  Laterality: N/A;   TOTAL HIP ARTHROPLASTY Right 10/25/2015   Procedure: RIGHT TOTAL HIP ARTHROPLASTY ANTERIOR APPROACH;  Surgeon: Kathryne Hitch, MD;  Location: [facility] ORS;  Service: Orthopedics;  Laterality: Right;   TUBAL LIGATION     Family History  Problem Relation Age of Onset   Stroke Mother    Clotting disorder Neg Hx    Social History   Socioeconomic History   Marital status: Married    Spouse name: Valecia Beske   Number of children: 4   Years of education: GED   Highest education level: GED or equivalent  Occupational History   Occupation: retired   Tobacco Use   Smoking status: Former    Current packs/day: 0.00    Average packs/day: 2.0 packs/day for 40.0 years (80.0 ttl pk-yrs)    Types: Cigarettes    Start date: 04/10/1973    Quit date: 04/10/2013    Years since quitting: 9.5   Smokeless tobacco: Never  Vaping Use   Vaping status: Former   Start date: 04/11/2013   Substances: Nicotine, Nicotine-salt  Substance and Sexual Activity   Alcohol use: Not Currently    Comment: rarely   Drug use: No   Sexual activity: Not Currently  Other Topics Concern   Not on file  Social History Narrative   Lives with husband   Right Handed   Drinks1-2 cups caffeine daily   Social Determinants of Health   Financial Resource Strain: Low Risk  (11/05/2022)   Overall Financial Resource Strain (CARDIA)    Difficulty of Paying Living Expenses: Not hard at all  Food Insecurity: No Food Insecurity (11/05/2022)   Hunger Vital Sign    Worried About Running Out of Food in the Last Year: Never true    Ran Out of Food in the Last Year: Never true  Transportation Needs: No Transportation Needs (11/05/2022)   PRAPARE - Administrator, Civil Service (Medical):  No    Lack of Transportation (Non-Medical): No  Physical Activity: Inactive (11/05/2022)   Exercise Vital Sign    Days of Exercise per Week: 0 days    Minutes of Exercise per Session: 0 min  Stress: No Stress Concern Present (11/05/2022)   [employer] of Occupational Health - Occupational Stress Questionnaire    Feeling of Stress : Only a little  Social Connections: Moderately Isolated (11/05/2022)   Social Connection and Isolation Panel [NHANES]    Frequency of Communication with Friends and Family: More than three times a week    Frequency of Social Gatherings with Friends and Family: Once a week  Attends Religious Services: Never    Active Member of Clubs or Organizations: No    Attends Engineer, structural: Never    Marital Status: Married    Tobacco Counseling Counseling given: Not Answered   Clinical Intake:  Pre-visit preparation completed: Yes  Pain : No/denies pain     BMI - recorded: 38.44 Nutritional Status: BMI > 30  Obese Nutritional Risks: None Diabetes: No  How often do you need to have someone help you when you read instructions, pamphlets, or other written materials from your doctor or pharmacy?: 1 - Never  Interpreter Needed?: No  Information entered by :: Lanier Ensign, LPN   Activities of Daily Living    11/05/2022    4:30 PM  In your present state of health, do you have any difficulty performing the following activities:  Hearing? 0  Vision? 0  Difficulty concentrating or making decisions? 0  Walking or climbing stairs? 1  Comment slowly  Dressing or bathing? 0  Doing errands, shopping? 0  Preparing Food and eating ? Y  Comment husband assist  Using the Toilet? N  In the past six months, have you accidently leaked urine? Y  Comment wears a brief  Do you have problems with loss of bowel control? N  Managing your Medications? N  Managing your Finances? N  Housekeeping or managing your Housekeeping? N    Patient Care  Team: Ardith Dark, MD as PCP - General (Family Medicine) Corky Crafts, MD as PCP - Cardiology (Cardiology)  Indicate any recent Medical Services you may have received from other than [facility] providers in the past year (date may be approximate).     Assessment:   This is a routine wellness examination for Berneda.  Hearing/Vision screen Hearing Screening - Comments:: Pt denies any hearing issues  Vision Screening - Comments:: Pt follows up with [facility] for annual eye exams   Dietary issues and exercise activities discussed:     Goals Addressed             This Visit's Progress    Patient Stated       Feel better       Depression Screen    11/05/2022    4:26 PM 08/11/2022    2:00 PM 05/14/2022    2:32 PM 02/03/2022    1:44 PM 10/30/2021    1:33 PM 10/23/2021   10:59 AM 07/15/2021    2:06 PM  PHQ 2/9 Scores  PHQ - 2 Score 1 2 1 1  2 3   PHQ- 9 Score  5 8 6  9 18   Exception Documentation     Patient refusal      Fall Risk    11/05/2022    4:29 PM 08/11/2022    1:32 PM 05/14/2022    1:27 PM 02/03/2022    1:12 PM 10/30/2021    1:04 PM  Fall Risk   Falls in the past year? 1 1 0 0 0  Number falls in past yr: 1 1 0 0 0  Injury with Fall? 1 1 0 0 0  Comment right shoulder      Risk for fall due to : Impaired balance/gait;Impaired mobility;Impaired vision;History of fall(s) No Fall Risks;Impaired balance/gait No Fall Risks No Fall Risks Impaired balance/gait  Follow up Falls prevention discussed        MEDICARE RISK AT HOME:  Medicare Risk at Home - 11/05/22 1629     Any stairs in or around  the home? Yes    If so, are there any without handrails? No    Home free of loose throw rugs in walkways, pet beds, electrical cords, etc? Yes    Adequate lighting in your home to reduce risk of falls? Yes    Life alert? Yes    Use of a cane, walker or w/c? Yes    Grab bars in the bathroom? Yes    Shower chair or bench in shower? Yes             TIMED UP  AND GO:  Was the test performed?  No    Cognitive Function:        11/05/2022    4:36 PM 10/23/2021   11:05 AM  6CIT Screen  What Year? 0 points 0 points  What month? 0 points 0 points  What time? 0 points 0 points  Count back from 20 0 points 0 points  Months in reverse 0 points 0 points  Repeat phrase 0 points 0 points  Total Score 0 points 0 points    Immunizations Immunization History  Administered Date(s) Administered   PFIZER(Purple Top)SARS-COV-2 Vaccination 07/19/2019, 08/09/2019   PNEUMOCOCCAL CONJUGATE-20 07/15/2021    TDAP status: Due, Education has been provided regarding the importance of this vaccine. Advised may receive this vaccine at local pharmacy or Health Dept. Aware to provide a copy of the vaccination record if obtained from local pharmacy or Health Dept. Verbalized acceptance and understanding.  Flu Vaccine status: Due, Education has been provided regarding the importance of this vaccine. Advised may receive this vaccine at local pharmacy or Health Dept. Aware to provide a copy of the vaccination record if obtained from local pharmacy or Health Dept. Verbalized acceptance and understanding.  Pneumococcal vaccine status: Up to date  Covid-19 vaccine status: Completed vaccines  Qualifies for Shingles Vaccine? Yes   Zostavax completed No   Shingrix Completed?: No.    Education has been provided regarding the importance of this vaccine. Patient has been advised to call insurance company to determine out of pocket expense if they have not yet received this vaccine. Advised may also receive vaccine at local pharmacy or Health Dept. Verbalized acceptance and understanding.  Screening Tests Health Maintenance  Topic Date Due   DTaP/Tdap/Td (1 - Tdap) Never done   Zoster Vaccines- Shingrix (1 of 2) 11/10/2022 (Originally 06/08/1969)   Lung Cancer Screening  11/05/2023 (Originally 06/09/1999)   MAMMOGRAM  11/05/2023 (Originally 06/09/1999)   DEXA SCAN   11/05/2023 (Originally 06/08/2014)   Colonoscopy  11/05/2023 (Originally 06/08/1994)   INFLUENZA VACCINE  11/26/2022   Medicare Annual Wellness (AWV)  11/05/2023   Pneumonia Vaccine 17+ Years old  Completed   Hepatitis C Screening  Completed   HPV VACCINES  Aged Out   COVID-19 Vaccine  Discontinued    Health Maintenance  Health Maintenance Due  Topic Date Due   DTaP/Tdap/Td (1 - Tdap) Never done    Pt postponed mammogram colonoscopy and bone scan at this time      Lung Cancer Screening: (Low Dose CT Chest recommended if Age 42-80 years, 20 pack-year currently smoking OR have quit w/in 15years.) does qualify.   Lung Cancer Screening Referral: postponed at this time   Additional Screening:  Hepatitis C Screening: Completed 07/17/16  Vision Screening: Recommended annual ophthalmology exams for early detection of glaucoma and other disorders of the eye. Is the patient up to date with their annual eye exam?  Yes  Who is the provider  or what is the name of the office in which the patient attends annual eye exams? [facility]  If pt is not established with a provider, would they like to be referred to a provider to establish care? No .   Dental Screening: Recommended annual dental exams for proper oral hygiene  Community Resource Referral / Chronic Care Management: CRR required this visit?  No   CCM required this visit?  No     Plan:     I have personally reviewed and noted the following in the patient's chart:   Medical and social history Use of alcohol, tobacco or illicit drugs  Current medications and supplements including opioid prescriptions. Patient is currently taking opioid prescriptions. Information provided to patient regarding non-opioid alternatives. Patient advised to discuss non-opioid treatment plan with their provider. Functional ability and status Nutritional status Physical activity Advanced directives List of other  physicians Hospitalizations, surgeries, and ER visits in previous 12 months Vitals Screenings to include cognitive, depression, and falls Referrals and appointments  In addition, I have reviewed and discussed with patient certain preventive protocols, quality metrics, and best practice recommendations. A written personalized care plan for preventive services as well as general preventive health recommendations were provided to patient.     Marzella Schlein, LPN   1/61/0960   After Visit Summary: (Mail) Due to this being a telephonic visit, the after visit summary with patients personalized plan was offered to patient via mail   Nurse Notes: none

## 2022-11-05 NOTE — Patient Instructions (Signed)
[name] Good , Thank you for taking time to come for your Medicare Wellness Visit. I appreciate your ongoing commitment to your health goals. Please review the following plan we discussed and let me know if I can assist you in the future.   These are the goals we discussed:  Goals      Patient Stated     Patient Stated     Lose weight      Patient Stated     Feel better        This is a list of the screening recommended for you and due dates:  Health Maintenance  Topic Date Due   DTaP/Tdap/Td vaccine (1 - Tdap) Never done   Colon Cancer Screening  Never done   Screening for Lung Cancer  Never done   Mammogram  Never done   DEXA scan (bone density measurement)  Never done   Zoster (Shingles) Vaccine (1 of 2) 11/10/2022*   Flu Shot  11/26/2022   Medicare Annual Wellness Visit  11/05/2023   Pneumonia Vaccine  Completed   Hepatitis C Screening  Completed   HPV Vaccine  Aged Out   COVID-19 Vaccine  Discontinued  *Topic was postponed. The date shown is not the original due date.    Advanced directives: Advance directive discussed with you today. Even though you declined this today please call our office should you change your mind and we can give you the proper paperwork for you to fill out.  Conditions/risks identified: get better   Next appointment: Follow up in one year for your annual wellness visit    Preventive Care 65 Years and Older, Female Preventive care refers to lifestyle choices and visits with your health care provider that can promote health and wellness. What does preventive care include? A yearly physical exam. This is also called an annual well check. Dental exams once or twice a year. Routine eye exams. Ask your health care provider how often you should have your eyes checked. Personal lifestyle choices, including: Daily care of your teeth and gums. Regular physical activity. Eating a healthy diet. Avoiding tobacco and drug use. Limiting alcohol  use. Practicing safe sex. Taking low-dose aspirin every day. Taking vitamin and mineral supplements as recommended by your health care provider. What happens during an annual well check? The services and screenings done by your health care provider during your annual well check will depend on your age, overall health, lifestyle risk factors, and family history of disease. Counseling  Your health care provider may ask you questions about your: Alcohol use. Tobacco use. Drug use. Emotional well-being. Home and relationship well-being. Sexual activity. Eating habits. History of falls. Memory and ability to understand (cognition). Work and work Astronomer. Reproductive health. Screening  You may have the following tests or measurements: Height, weight, and BMI. Blood pressure. Lipid and cholesterol levels. These may be checked every 5 years, or more frequently if you are over 94 years old. Skin check. Lung cancer screening. You may have this screening every year starting at age 37 if you have a 30-pack-year history of smoking and currently smoke or have quit within the past 15 years. Fecal occult blood test (FOBT) of the stool. You may have this test every year starting at age 60. Flexible sigmoidoscopy or colonoscopy. You may have a sigmoidoscopy every 5 years or a colonoscopy every 10 years starting at age 59. Hepatitis C blood test. Hepatitis B blood test. Sexually transmitted disease (STD) testing. Diabetes screening. This is  done by checking your blood sugar (glucose) after you have not eaten for a while (fasting). You may have this done every 1-3 years. Bone density scan. This is done to screen for osteoporosis. You may have this done starting at age 60. Mammogram. This may be done every 1-2 years. Talk to your health care provider about how often you should have regular mammograms. Talk with your health care provider about your test results, treatment options, and if necessary,  the need for more tests. Vaccines  Your health care provider may recommend certain vaccines, such as: Influenza vaccine. This is recommended every year. Tetanus, diphtheria, and acellular pertussis (Tdap, Td) vaccine. You may need a Td booster every 10 years. Zoster vaccine. You may need this after age 36. Pneumococcal 13-valent conjugate (PCV13) vaccine. One dose is recommended after age 51. Pneumococcal polysaccharide (PPSV23) vaccine. One dose is recommended after age 60. Talk to your health care provider about which screenings and vaccines you need and how often you need them. This information is not intended to replace advice given to you by your health care provider. Make sure you discuss any questions you have with your health care provider. Document Released: 05/10/2015 Document Revised: 01/01/2016 Document Reviewed: 02/12/2015 Elsevier Interactive Patient Education  2017 [employer].  Fall Prevention in the Home Falls can cause injuries. They can happen to people of all ages. There are many things you can do to make your home safe and to help prevent falls. What can I do on the outside of my home? Regularly fix the edges of walkways and driveways and fix any cracks. Remove anything that might make you trip as you walk through a door, such as a raised step or threshold. Trim any bushes or trees on the path to your home. Use bright outdoor lighting. Clear any walking paths of anything that might make someone trip, such as rocks or tools. Regularly check to see if handrails are loose or broken. Make sure that both sides of any steps have handrails. Any raised decks and porches should have guardrails on the edges. Have any leaves, snow, or ice cleared regularly. Use sand or salt on walking paths during winter. Clean up any spills in your garage right away. This includes oil or grease spills. What can I do in the bathroom? Use night lights. Install grab bars by the toilet and in the  tub and shower. Do not use towel bars as grab bars. Use non-skid mats or decals in the tub or shower. If you need to sit down in the shower, use a plastic, non-slip stool. Keep the floor dry. Clean up any water that spills on the floor as soon as it happens. Remove soap buildup in the tub or shower regularly. Attach bath mats securely with double-sided non-slip rug tape. Do not have throw rugs and other things on the floor that can make you trip. What can I do in the bedroom? Use night lights. Make sure that you have a light by your bed that is easy to reach. Do not use any sheets or blankets that are too big for your bed. They should not hang down onto the floor. Have a firm chair that has side arms. You can use this for support while you get dressed. Do not have throw rugs and other things on the floor that can make you trip. What can I do in the kitchen? Clean up any spills right away. Avoid walking on wet floors. Keep items that you  use a lot in easy-to-reach places. If you need to reach something above you, use a strong step stool that has a grab bar. Keep electrical cords out of the way. Do not use floor polish or wax that makes floors slippery. If you must use wax, use non-skid floor wax. Do not have throw rugs and other things on the floor that can make you trip. What can I do with my stairs? Do not leave any items on the stairs. Make sure that there are handrails on both sides of the stairs and use them. Fix handrails that are broken or loose. Make sure that handrails are as long as the stairways. Check any carpeting to make sure that it is firmly attached to the stairs. Fix any carpet that is loose or worn. Avoid having throw rugs at the top or bottom of the stairs. If you do have throw rugs, attach them to the floor with carpet tape. Make sure that you have a light switch at the top of the stairs and the bottom of the stairs. If you do not have them, ask someone to add them for  you. What else can I do to help prevent falls? Wear shoes that: Do not have high heels. Have rubber bottoms. Are comfortable and fit you well. Are closed at the toe. Do not wear sandals. If you use a stepladder: Make sure that it is fully opened. Do not climb a closed stepladder. Make sure that both sides of the stepladder are locked into place. Ask someone to hold it for you, if possible. Clearly mark and make sure that you can see: Any grab bars or handrails. First and last steps. Where the edge of each step is. Use tools that help you move around (mobility aids) if they are needed. These include: Canes. Walkers. Scooters. Crutches. Turn on the lights when you go into a dark area. Replace any light bulbs as soon as they burn out. Set up your furniture so you have a clear path. Avoid moving your furniture around. If any of your floors are uneven, fix them. If there are any pets around you, be aware of where they are. Review your medicines with your doctor. Some medicines can make you feel dizzy. This can increase your chance of falling. Ask your doctor what other things that you can do to help prevent falls. This information is not intended to replace advice given to you by your health care provider. Make sure you discuss any questions you have with your health care provider. Document Released: 02/07/2009 Document Revised: 09/19/2015 Document Reviewed: 05/18/2014 Elsevier Interactive Patient Education  2017 [employer].

## 2022-11-10 ENCOUNTER — Encounter: Payer: Self-pay | Admitting: Family Medicine

## 2022-11-10 ENCOUNTER — Ambulatory Visit (INDEPENDENT_AMBULATORY_CARE_PROVIDER_SITE_OTHER): Payer: Medicare HMO | Admitting: Family Medicine

## 2022-11-10 DIAGNOSIS — G894 Chronic pain syndrome: Secondary | ICD-10-CM

## 2022-11-10 DIAGNOSIS — Z7985 Long-term (current) use of injectable non-insulin antidiabetic drugs: Secondary | ICD-10-CM | POA: Diagnosis not present

## 2022-11-10 DIAGNOSIS — Z6839 Body mass index (BMI) 39.0-39.9, adult: Secondary | ICD-10-CM | POA: Diagnosis not present

## 2022-11-10 DIAGNOSIS — E1169 Type 2 diabetes mellitus with other specified complication: Secondary | ICD-10-CM | POA: Diagnosis not present

## 2022-11-10 DIAGNOSIS — R7303 Prediabetes: Secondary | ICD-10-CM

## 2022-11-10 LAB — POCT GLYCOSYLATED HEMOGLOBIN (HGB A1C): Hemoglobin A1C: 5.7 % — AB (ref 4.0–5.6)

## 2022-11-10 MED ORDER — TIRZEPATIDE 2.5 MG/0.5ML ~~LOC~~ SOAJ: 2.5000 mg | SUBCUTANEOUS | 0 refills | Status: DC

## 2022-11-10 MED ORDER — TIRZEPATIDE 5 MG/0.5ML ~~LOC~~ SOAJ
5.0000 mg | SUBCUTANEOUS | 0 refills | Status: DC
Start: 2022-11-10 — End: 2022-12-11

## 2022-11-10 NOTE — Assessment & Plan Note (Signed)
On oxycodone 10-325 five times daily as needed.  Does not need refill today.  Medications help with pain and ability to perform activities of daily living.  No significant side effects.  Does not need refill today.  Database without red flags.

## 2022-11-10 NOTE — Assessment & Plan Note (Signed)
BMI 39 with comorbidities.  She did not have any improvement with phentermine.  We will remove this from medication list.  Will be starting [facility] for glycemic control as below which should help with her weight as well.

## 2022-11-10 NOTE — Assessment & Plan Note (Signed)
A1c 5.7 however would benefit from GLP-1 agonist due to her comorbidities including obesity.  She did have a fasting glucose of 140 a couple of years ago which is in the diabetic range.  Will start Mounjaro 2.5 mg weekly and then increase to 5 mg weekly.  He will follow-up with me in a few weeks via MyChart and we can titrate the dose as needed.  Recheck A1c in 3 months.

## 2022-11-10 NOTE — Progress Notes (Signed)
   [name] Good is a 72 y.o. female who presents today for an office visit.  Assessment/Plan:  New/Acute Problems: Fall Still with some chest wall pain and ecchymosis on left upper extremity.  Her pain is improving.  Discussed with patient that she may have a slight rib fracture however given that symptoms are improving do not think that getting imaging at this point would significantly change her management.  We discussed importance of deep inspiration and breathing exercises.  Her pain should hopefully continue to improve over the next few weeks.  She will let us know if pain does not continue to improve and we can check imaging at that time.  We did discuss referral to physical therapy due to her recent fall however she declined.  Chronic Problems Addressed Today: T2DM (type 2 diabetes mellitus) (HCC) A1c 5.7 however would benefit from GLP-1 agonist due to her comorbidities including obesity.  She did have a fasting glucose of 140 a couple of years ago which is in the diabetic range.  Will start Mounjaro 2.5 mg weekly and then increase to 5 mg weekly.  He will follow-up with me in a few weeks via MyChart and we can titrate the dose as needed.  Recheck A1c in 3 months.  Chronic pain syndrome On oxycodone 10-325 five times daily as needed.  Does not need refill today.  Medications help with pain and ability to perform activities of daily living.  No significant side effects.  Does not need refill today.  Database without red flags.  Morbid obesity (HCC) BMI 39 with comorbidities.  She did not have any improvement with phentermine.  We will remove this from medication list.  Will be starting [facility] for glycemic control as below which should help with her weight as well.     Subjective:  HPI:  See A/P for status of chronic conditions.  Patient is here for follow-up.  Seen last 3 months ago.  At our last visit we checked labs which showed elevated LDL and recommended starting her on Zetia.   Her A1c at that time was 5.8.  I had tried sending in GLP-1 agonist however insurance would not pay for this.  We restarted phentermine 37.5 mg daily about a month ago.  She does not think this has been very effective for appetite control.  No significant side effects  She fell at home a week ago Tripped over a stool  at home and landed on her left soide.  Initially had pain on left side of her torso and left arm.  This is improving.  No falls since then.  Some pain with deep inspiration.       Objective:  Physical Exam: BP 119/78   Pulse 93   Temp (!) 97.3 F (36.3 C) (Temporal)   Ht 5\' 3"  (1.6 m)   Wt 220 lb 6.4 oz (100 kg)   SpO2 100%   BMI 39.04 kg/m   Wt Readings from Last 3 Encounters:  11/10/22 220 lb 6.4 oz (100 kg)  11/05/22 217 lb (98.4 kg)  08/11/22 217 lb (98.4 kg)    Gen: No acute distress, resting comfortably CV: Regular rate and rhythm with no murmurs appreciated Pulm: Normal work of breathing, clear to auscultation bilaterally with no crackles, wheezes, or rhonchi MUSCULOSKELETAL: Diffuse ecchymosis noted on the left arm. Neuro: Grossly normal, moves all extremities Psych: Normal affect and thought content      [name] Good M. [name], MD 11/10/2022 3:00 PM

## 2022-11-10 NOTE — Patient Instructions (Signed)
It was very nice to see you today!  We will start mounarjo for diabetes.  Return in about 3 months (around 02/10/2023) for Follow Up.   Take care, Dr [name]  PLEASE NOTE:  If you had any lab tests, please let us know if you have not heard back within a few days. You may see your results on mychart before we have a chance to review them but we will give you a call once they are reviewed by [place].   If we ordered any referrals today, please let us know if you have not heard from their office within the next week.   If you had any urgent prescriptions sent in today, please check with the pharmacy within an hour of our visit to make sure the prescription was transmitted appropriately.   Please try these tips to maintain a healthy lifestyle:  Eat at least 3 REAL meals and 1-2 snacks per day.  Aim for no more than 5 hours between eating.  If you eat breakfast, please do so within one hour of getting up.   Each meal should contain half fruits/vegetables, one quarter protein, and one quarter carbs (no bigger than a computer mouse)  Cut down on sweet beverages. This includes juice, soda, and sweet tea.   Drink at least 1 glass of water with each meal and aim for at least 8 glasses per day  Exercise at least 150 minutes every week.

## 2022-11-24 ENCOUNTER — Other Ambulatory Visit: Payer: Self-pay | Admitting: Family Medicine

## 2022-11-24 NOTE — Telephone Encounter (Signed)
Prescription Request  11/24/2022  LOV: 11/10/2022  What is the name of the medication or equipment?  oxyCODONE-acetaminophen (PERCOCET) 10-325 MG tablet   Have you contacted your pharmacy to request a refill? No   Which pharmacy would you like this sent to? [id number] - [place], [place] - [address] [place] [address] Phone: [phone] Fax: [phone]   Patient notified that their request is being sent to the clinical staff for review and that they should receive a response within 2 business days.   Please advise at Mobile There is no such number on file (mobile).

## 2022-11-25 MED ORDER — OXYCODONE-ACETAMINOPHEN 10-325 MG PO TABS: ORAL_TABLET | ORAL | 0 refills | Status: DC

## 2022-11-30 ENCOUNTER — Telehealth: Payer: Self-pay | Admitting: Family Medicine

## 2022-11-30 NOTE — Telephone Encounter (Signed)
Spoke with patient stated started [facility] 2.5mg   After first dose started to have Sx  nausea, diarrhea and stomach pain  Did not had symptoms with previews dose  Please advise

## 2022-11-30 NOTE — Telephone Encounter (Signed)
Pt states RX  tirzepatide [facility]) 2.5 MG/0.5ML Pen  Is making her really sick and she needs to know what to do. Please advise.

## 2022-11-30 NOTE — Telephone Encounter (Signed)
This generally should improve after her body gets used to the medication. If this continues then we can stop.  In the meantime we can send in Zofran 4 mg every 8 hours as needed to help with the symptoms.  [name]. [name], MD 11/30/2022 3:12 PM

## 2022-12-01 ENCOUNTER — Other Ambulatory Visit: Payer: Self-pay | Admitting: *Deleted

## 2022-12-01 MED ORDER — ONDANSETRON HCL 4 MG PO TABS: 4.0000 mg | ORAL_TABLET | Freq: Three times a day (TID) | ORAL | 0 refills | Status: DC | PRN

## 2022-12-01 NOTE — Telephone Encounter (Signed)
Spoke with patient, information given: This generally should improve after her body gets used to the medication. If this continues then we can stop. Rx Zofran send to [facility]

## 2022-12-01 NOTE — Telephone Encounter (Signed)
Pt would like a call back about bottom message.

## 2022-12-04 ENCOUNTER — Other Ambulatory Visit: Payer: Self-pay | Admitting: Interventional Cardiology

## 2022-12-07 ENCOUNTER — Emergency Department (HOSPITAL_COMMUNITY): Payer: Medicare HMO

## 2022-12-07 ENCOUNTER — Encounter (HOSPITAL_COMMUNITY): Payer: Self-pay | Admitting: Emergency Medicine

## 2022-12-07 ENCOUNTER — Inpatient Hospital Stay (HOSPITAL_COMMUNITY)
Admission: EM | Admit: 2022-12-07 | Discharge: 2022-12-11 | DRG: 393 | Disposition: A | Discharge status: Home / Self Care | Payer: Medicare HMO | Attending: Internal Medicine | Admitting: Internal Medicine

## 2022-12-07 ENCOUNTER — Other Ambulatory Visit: Payer: Self-pay

## 2022-12-07 DIAGNOSIS — I82890 Acute embolism and thrombosis of other specified veins: Secondary | ICD-10-CM | POA: Diagnosis present

## 2022-12-07 DIAGNOSIS — R110 Nausea: Secondary | ICD-10-CM | POA: Diagnosis not present

## 2022-12-07 DIAGNOSIS — R760 Raised antibody titer: Secondary | ICD-10-CM | POA: Diagnosis present

## 2022-12-07 DIAGNOSIS — R531 Weakness: Secondary | ICD-10-CM | POA: Diagnosis not present

## 2022-12-07 DIAGNOSIS — R7989 Other specified abnormal findings of blood chemistry: Secondary | ICD-10-CM | POA: Diagnosis not present

## 2022-12-07 DIAGNOSIS — R188 Other ascites: Secondary | ICD-10-CM | POA: Diagnosis not present

## 2022-12-07 DIAGNOSIS — M549 Dorsalgia, unspecified: Secondary | ICD-10-CM | POA: Diagnosis present

## 2022-12-07 DIAGNOSIS — K219 Gastro-esophageal reflux disease without esophagitis: Secondary | ICD-10-CM | POA: Diagnosis not present

## 2022-12-07 DIAGNOSIS — K55069 Acute infarction of intestine, part and extent unspecified: Secondary | ICD-10-CM | POA: Diagnosis not present

## 2022-12-07 DIAGNOSIS — D8989 Other specified disorders involving the immune mechanism, not elsewhere classified: Secondary | ICD-10-CM | POA: Diagnosis not present

## 2022-12-07 DIAGNOSIS — Z6839 Body mass index (BMI) 39.0-39.9, adult: Secondary | ICD-10-CM | POA: Diagnosis not present

## 2022-12-07 DIAGNOSIS — Z7902 Long term (current) use of antithrombotics/antiplatelets: Secondary | ICD-10-CM

## 2022-12-07 DIAGNOSIS — I81 Portal vein thrombosis: Secondary | ICD-10-CM | POA: Diagnosis present

## 2022-12-07 DIAGNOSIS — Z9049 Acquired absence of other specified parts of digestive tract: Secondary | ICD-10-CM | POA: Diagnosis not present

## 2022-12-07 DIAGNOSIS — R768 Other specified abnormal immunological findings in serum: Secondary | ICD-10-CM | POA: Diagnosis present

## 2022-12-07 DIAGNOSIS — Z7952 Long term (current) use of systemic steroids: Secondary | ICD-10-CM

## 2022-12-07 DIAGNOSIS — D84821 Immunodeficiency due to drugs: Secondary | ICD-10-CM | POA: Diagnosis present

## 2022-12-07 DIAGNOSIS — I700 Atherosclerosis of aorta: Secondary | ICD-10-CM | POA: Diagnosis present

## 2022-12-07 DIAGNOSIS — M359 Systemic involvement of connective tissue, unspecified: Secondary | ICD-10-CM | POA: Diagnosis present

## 2022-12-07 DIAGNOSIS — I10 Essential (primary) hypertension: Secondary | ICD-10-CM | POA: Diagnosis not present

## 2022-12-07 DIAGNOSIS — K760 Fatty (change of) liver, not elsewhere classified: Secondary | ICD-10-CM | POA: Diagnosis not present

## 2022-12-07 DIAGNOSIS — E669 Obesity, unspecified: Secondary | ICD-10-CM | POA: Diagnosis not present

## 2022-12-07 DIAGNOSIS — R945 Abnormal results of liver function studies: Secondary | ICD-10-CM | POA: Diagnosis present

## 2022-12-07 DIAGNOSIS — F419 Anxiety disorder, unspecified: Secondary | ICD-10-CM | POA: Diagnosis present

## 2022-12-07 DIAGNOSIS — Z8673 Personal history of transient ischemic attack (TIA), and cerebral infarction without residual deficits: Secondary | ICD-10-CM | POA: Diagnosis not present

## 2022-12-07 DIAGNOSIS — Z96641 Presence of right artificial hip joint: Secondary | ICD-10-CM | POA: Diagnosis present

## 2022-12-07 DIAGNOSIS — Z880 Allergy status to penicillin: Secondary | ICD-10-CM | POA: Diagnosis not present

## 2022-12-07 DIAGNOSIS — I2510 Atherosclerotic heart disease of native coronary artery without angina pectoris: Secondary | ICD-10-CM | POA: Diagnosis present

## 2022-12-07 DIAGNOSIS — F32A Depression, unspecified: Secondary | ICD-10-CM | POA: Diagnosis not present

## 2022-12-07 DIAGNOSIS — R1013 Epigastric pain: Secondary | ICD-10-CM | POA: Diagnosis not present

## 2022-12-07 DIAGNOSIS — R9431 Abnormal electrocardiogram [ECG] [EKG]: Secondary | ICD-10-CM | POA: Diagnosis not present

## 2022-12-07 DIAGNOSIS — R197 Diarrhea, unspecified: Secondary | ICD-10-CM | POA: Diagnosis not present

## 2022-12-07 DIAGNOSIS — M1990 Unspecified osteoarthritis, unspecified site: Secondary | ICD-10-CM | POA: Diagnosis not present

## 2022-12-07 DIAGNOSIS — E876 Hypokalemia: Secondary | ICD-10-CM | POA: Diagnosis present

## 2022-12-07 DIAGNOSIS — Z7985 Long-term (current) use of injectable non-insulin antidiabetic drugs: Secondary | ICD-10-CM

## 2022-12-07 DIAGNOSIS — R7303 Prediabetes: Secondary | ICD-10-CM | POA: Diagnosis present

## 2022-12-07 DIAGNOSIS — Z7960 Long term (current) use of unspecified immunomodulators and immunosuppressants: Secondary | ICD-10-CM | POA: Diagnosis not present

## 2022-12-07 DIAGNOSIS — G894 Chronic pain syndrome: Secondary | ICD-10-CM | POA: Diagnosis not present

## 2022-12-07 DIAGNOSIS — I252 Old myocardial infarction: Secondary | ICD-10-CM

## 2022-12-07 DIAGNOSIS — Z823 Family history of stroke: Secondary | ICD-10-CM

## 2022-12-07 DIAGNOSIS — Z79899 Other long term (current) drug therapy: Secondary | ICD-10-CM

## 2022-12-07 DIAGNOSIS — K55059 Acute (reversible) ischemia of intestine, part and extent unspecified: Principal | ICD-10-CM | POA: Diagnosis present

## 2022-12-07 DIAGNOSIS — R1084 Generalized abdominal pain: Secondary | ICD-10-CM | POA: Diagnosis not present

## 2022-12-07 DIAGNOSIS — Z87891 Personal history of nicotine dependence: Secondary | ICD-10-CM | POA: Diagnosis not present

## 2022-12-07 DIAGNOSIS — R932 Abnormal findings on diagnostic imaging of liver and biliary tract: Secondary | ICD-10-CM | POA: Diagnosis not present

## 2022-12-07 DIAGNOSIS — Z7982 Long term (current) use of aspirin: Secondary | ICD-10-CM

## 2022-12-07 LAB — TROPONIN I (HIGH SENSITIVITY): Troponin I (High Sensitivity): 10 ng/L (ref <18)

## 2022-12-07 LAB — CBC WITH DIFFERENTIAL/PLATELET
Abs Immature Granulocytes: 0.05 10*3/uL (ref 0.00–0.07)
Basophils Absolute: 0.1 10*3/uL (ref 0.0–0.1)
Basophils Relative: 1 %
Eosinophils Absolute: 0 10*3/uL (ref 0.0–0.5)
Eosinophils Relative: 0 %
HCT: 42.8 % (ref 36.0–46.0)
Hemoglobin: 13.7 g/dL (ref 12.0–15.0)
Immature Granulocytes: 1 %
Lymphocytes Relative: 15 %
Lymphs Abs: 1.6 10*3/uL (ref 0.7–4.0)
MCH: 30.3 pg (ref 26.0–34.0)
MCHC: 32 g/dL (ref 30.0–36.0)
MCV: 94.7 fL (ref 80.0–100.0)
Monocytes Absolute: 1 10*3/uL (ref 0.1–1.0)
Monocytes Relative: 9 %
Neutro Abs: 7.6 10*3/uL (ref 1.7–7.7)
Neutrophils Relative %: 74 %
Platelets: 279 10*3/uL (ref 150–400)
RBC: 4.52 MIL/uL (ref 3.87–5.11)
RDW: 12.6 % (ref 11.5–15.5)
WBC: 10.3 10*3/uL (ref 4.0–10.5)
nRBC: 0 % (ref 0.0–0.2)

## 2022-12-07 LAB — URINALYSIS, ROUTINE W REFLEX MICROSCOPIC
Bilirubin Urine: NEGATIVE
Glucose, UA: NEGATIVE mg/dL
Ketones, ur: 5 mg/dL — AB
Nitrite: NEGATIVE
Protein, ur: NEGATIVE mg/dL
Specific Gravity, Urine: >1.046 — ABNORMAL HIGH (ref 1.005–1.030)
pH: 7 (ref 5.0–8.0)

## 2022-12-07 LAB — COMPREHENSIVE METABOLIC PANEL
ALT: 24 U/L (ref 0–44)
AST: 45 U/L — ABNORMAL HIGH (ref 15–41)
Albumin: 2.8 g/dL — ABNORMAL LOW (ref 3.5–5.0)
Alkaline Phosphatase: 116 U/L (ref 38–126)
Anion gap: 14 (ref 5–15)
BUN: 10 mg/dL (ref 8–23)
CO2: 23 mmol/L (ref 22–32)
Calcium: 8.6 mg/dL — ABNORMAL LOW (ref 8.9–10.3)
Chloride: 99 mmol/L (ref 98–111)
Creatinine, Ser: 0.85 mg/dL (ref 0.44–1.00)
GFR, Estimated: >60 mL/min (ref >60)
Glucose, Bld: 115 mg/dL — ABNORMAL HIGH (ref 70–99)
Potassium: 3.7 mmol/L (ref 3.5–5.1)
Sodium: 136 mmol/L (ref 135–145)
Total Bilirubin: 1.3 mg/dL — ABNORMAL HIGH (ref 0.3–1.2)
Total Protein: 7 g/dL (ref 6.5–8.1)

## 2022-12-07 LAB — PROTIME-INR
INR: 1.3 — ABNORMAL HIGH (ref 0.8–1.2)
Prothrombin Time: 16.3 seconds — ABNORMAL HIGH (ref 11.4–15.2)

## 2022-12-07 LAB — APTT: aPTT: 32 seconds (ref 24–36)

## 2022-12-07 LAB — LIPASE, BLOOD: Lipase: 39 U/L (ref 11–51)

## 2022-12-07 LAB — HEPARIN LEVEL (UNFRACTIONATED): Heparin Unfractionated: 0.47 IU/mL (ref 0.30–0.70)

## 2022-12-07 MED ORDER — ESCITALOPRAM OXALATE 10 MG PO TABS
20.0000 mg | ORAL_TABLET | Freq: Every day | ORAL | Status: DC
  Administered 2022-12-07 – 2022-12-11 (×5): 20 mg via ORAL
  Filled 2022-12-07 (×5): qty 2

## 2022-12-07 MED ORDER — ACETAMINOPHEN 325 MG PO TABS
650.0000 mg | ORAL_TABLET | Freq: Four times a day (QID) | ORAL | Status: DC | PRN
  Filled 2022-12-07: qty 2

## 2022-12-07 MED ORDER — IOHEXOL 350 MG/ML SOLN
75.0000 mL | Freq: Once | INTRAVENOUS | Status: AC | PRN
  Administered 2022-12-07: 75 mL via INTRAVENOUS

## 2022-12-07 MED ORDER — ACETAMINOPHEN 325 MG PO TABS
325.0000 mg | ORAL_TABLET | ORAL | Status: DC | PRN
  Administered 2022-12-08 – 2022-12-09 (×2): 325 mg via ORAL
  Filled 2022-12-07: qty 1

## 2022-12-07 MED ORDER — CLOPIDOGREL BISULFATE 75 MG PO TABS
75.0000 mg | ORAL_TABLET | Freq: Every day | ORAL | Status: DC
  Administered 2022-12-08 – 2022-12-11 (×4): 75 mg via ORAL
  Filled 2022-12-07 (×5): qty 1

## 2022-12-07 MED ORDER — ONDANSETRON HCL 4 MG/2ML IJ SOLN: 4.0000 mg | Freq: Four times a day (QID) | INTRAMUSCULAR | Status: DC | PRN

## 2022-12-07 MED ORDER — PANTOPRAZOLE SODIUM 40 MG IV SOLR
40.0000 mg | Freq: Once | INTRAVENOUS | Status: AC
  Administered 2022-12-07: 40 mg via INTRAVENOUS
  Filled 2022-12-07: qty 10

## 2022-12-07 MED ORDER — ALUM & MAG HYDROXIDE-SIMETH 200-200-20 MG/5ML PO SUSP
30.0000 mL | Freq: Once | ORAL | Status: AC
  Administered 2022-12-07: 30 mL via ORAL
  Filled 2022-12-07: qty 30

## 2022-12-07 MED ORDER — FAMOTIDINE 20 MG PO TABS
20.0000 mg | ORAL_TABLET | Freq: Once | ORAL | Status: AC
  Administered 2022-12-07: 20 mg via ORAL
  Filled 2022-12-07: qty 1

## 2022-12-07 MED ORDER — OXYCODONE HCL 5 MG PO TABS
10.0000 mg | ORAL_TABLET | ORAL | Status: DC | PRN
  Administered 2022-12-07 – 2022-12-10 (×9): 10 mg via ORAL
  Filled 2022-12-07 (×10): qty 2

## 2022-12-07 MED ORDER — OXYCODONE-ACETAMINOPHEN 10-325 MG PO TABS: 1.0000 | ORAL_TABLET | ORAL | Status: DC | PRN

## 2022-12-07 MED ORDER — HYDROMORPHONE HCL 1 MG/ML IJ SOLN
1.0000 mg | INTRAMUSCULAR | Status: DC | PRN
  Administered 2022-12-07 – 2022-12-10 (×2): 1 mg via INTRAVENOUS
  Filled 2022-12-07 (×2): qty 1

## 2022-12-07 MED ORDER — HEPARIN (PORCINE) 25000 UT/250ML-% IV SOLN
1400.0000 [IU]/h | INTRAVENOUS | Status: AC
  Administered 2022-12-07 – 2022-12-08 (×3): 1250 [IU]/h via INTRAVENOUS
  Administered 2022-12-09: 1400 [IU]/h via INTRAVENOUS
  Filled 2022-12-07 (×4): qty 250

## 2022-12-07 MED ORDER — DOCUSATE SODIUM 100 MG PO CAPS
100.0000 mg | ORAL_CAPSULE | Freq: Two times a day (BID) | ORAL | Status: DC
  Administered 2022-12-07 – 2022-12-11 (×7): 100 mg via ORAL
  Filled 2022-12-07 (×8): qty 1

## 2022-12-07 MED ORDER — ONDANSETRON HCL 4 MG PO TABS: 4.0000 mg | ORAL_TABLET | Freq: Four times a day (QID) | ORAL | Status: DC | PRN

## 2022-12-07 MED ORDER — LACTATED RINGERS IV BOLUS
500.0000 mL | Freq: Once | INTRAVENOUS | Status: AC
  Administered 2022-12-07: 500 mL via INTRAVENOUS

## 2022-12-07 MED ORDER — FAMOTIDINE 20 MG PO TABS
20.0000 mg | ORAL_TABLET | Freq: Every day | ORAL | Status: DC
  Administered 2022-12-08 – 2022-12-11 (×4): 20 mg via ORAL
  Filled 2022-12-07 (×4): qty 1

## 2022-12-07 MED ORDER — ROPINIROLE HCL 0.5 MG PO TABS
3.0000 mg | ORAL_TABLET | Freq: Every evening | ORAL | Status: DC | PRN
  Administered 2022-12-08: 3 mg via ORAL
  Filled 2022-12-07: qty 6

## 2022-12-07 MED ORDER — ALBUTEROL SULFATE (2.5 MG/3ML) 0.083% IN NEBU: 2.5000 mg | INHALATION_SOLUTION | Freq: Four times a day (QID) | RESPIRATORY_TRACT | Status: DC | PRN

## 2022-12-07 MED ORDER — HYDROMORPHONE HCL 1 MG/ML IJ SOLN
0.5000 mg | Freq: Once | INTRAMUSCULAR | Status: AC
  Administered 2022-12-07: 0.5 mg via INTRAVENOUS
  Filled 2022-12-07: qty 1

## 2022-12-07 MED ORDER — PREDNISONE 5 MG PO TABS
5.0000 mg | ORAL_TABLET | Freq: Every day | ORAL | Status: DC
  Administered 2022-12-07 – 2022-12-11 (×5): 5 mg via ORAL
  Filled 2022-12-07 (×5): qty 1

## 2022-12-07 MED ORDER — ACETAMINOPHEN 650 MG RE SUPP: 650.0000 mg | Freq: Four times a day (QID) | RECTAL | Status: DC | PRN

## 2022-12-07 MED ORDER — FENTANYL CITRATE PF 50 MCG/ML IJ SOSY
50.0000 ug | PREFILLED_SYRINGE | Freq: Once | INTRAMUSCULAR | Status: AC
  Administered 2022-12-07: 50 ug via INTRAVENOUS
  Filled 2022-12-07: qty 1

## 2022-12-07 MED ORDER — HEPARIN BOLUS VIA INFUSION
5000.0000 [IU] | Freq: Once | INTRAVENOUS | Status: AC
  Administered 2022-12-07: 5000 [IU] via INTRAVENOUS
  Filled 2022-12-07: qty 5000

## 2022-12-07 MED ORDER — ORAL CARE MOUTH RINSE: 15.0000 mL | OROMUCOSAL | Status: DC | PRN

## 2022-12-07 MED ORDER — ENOXAPARIN SODIUM 40 MG/0.4ML IJ SOSY: 40.0000 mg | PREFILLED_SYRINGE | INTRAMUSCULAR | Status: DC

## 2022-12-07 NOTE — ED Notes (Signed)
Phlebotomy at bedside.

## 2022-12-07 NOTE — Plan of Care (Signed)
  Problem: Education: Goal: Knowledge of General Education information will improve Description: Including pain rating scale, medication(s)/side effects and non-pharmacologic comfort measures Outcome: Progressing   Problem: Activity: Goal: Risk for activity intolerance will decrease Outcome: Progressing   Problem: Nutrition: Goal: Adequate nutrition will be maintained Outcome: Progressing   Problem: Elimination: Goal: Will not experience complications related to bowel motility Outcome: Progressing   Problem: Pain Managment: Goal: General experience of comfort will improve Outcome: Progressing   

## 2022-12-07 NOTE — Progress Notes (Signed)
ANTICOAGULATION CONSULT NOTE - Initial Consult  Pharmacy Consult for Heparin infusion Indication:  mesenteric, portal, and splenic vein thromboses, h/o CVA  Allergies  Allergen Reactions   Amoxicillin Other (See Comments)    Tolerated Zosyn Has patient had a PCN reaction causing immediate rash, facial/tongue/throat swelling, SOB or lightheadedness with hypotension: No Has patient had a PCN reaction causing severe rash involving mucus membranes or skin necrosis: No Has patient had a PCN reaction that required hospitalization No Has patient had a PCN reaction occurring within the last 10 years: No If all of the above answers are "NO", then may proceed with Cephalosporin use.    Patient Measurements: Height: 5\' 2"  (157.5 cm) Weight: 97.5 kg (215 lb) IBW/kg (Calculated) : 50.1 Heparin Dosing Weight: 73.1 kg  Vital Signs: Temp: 98.1 F (36.7 C) (08/12 0733) Temp Source: Oral (08/12 0733) BP: 130/60 (08/12 0733) Pulse Rate: 88 (08/12 0733)  Labs: Recent Labs    12/07/22 0141  HGB 13.7  HCT 42.8  PLT 279  CREATININE 0.85  TROPONINIHS 10    Estimated Creatinine Clearance: 64.3 mL/min (by C-G formula based on SCr of 0.85 mg/dL).   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 Pt has a history of CVA in 06/21 along with previous LV apical thrombus in 06/21. Pt was treated with apixaban for LV thrombus, but apixaban was de-escalated to aspirin and clopidogrel in 08/23 due  to resolution of LV thrombus on f/u imaging. Of note, patient had a previously low-medium positive anticoardiolipin antibody IgM when tested in 2018.  Pharmacy has been consulted to dose heparin infusion for treatment of mesenteric, portal, and splenic vein thromboses.   Hgb 13.7, Plt 279  No s/sx of bleeding noted  Goal of Therapy:  Heparin level 0.3-0.7 units/ml Monitor platelets by anticoagulation protocol: Yes   Plan:  Give heparin 5000 units IV bolus x1 from infusion, then  Initiate heparin infusion at 1250 units/hr  Check heparin level in 8 hours Monitor daily CBC, heparin level, and for s/sx of bleeding F/u plans for any further procedures or interventions and ability to transition to oral [facility]   Wilburn Cornelia, PharmD, BCPS Clinical Pharmacist 12/07/2022 8:48 AM   Please refer to AMION for pharmacy phone number

## 2022-12-07 NOTE — ED Triage Notes (Signed)
Pt BIB [facility] from home.  Pt started mounjaro on 8/1 and has "not felt right since"  Abdominal pain noted. Pt has taken oxycodone and benadryl tonight for pain.  Nausea, no vomiting. Has had some diarrhea

## 2022-12-07 NOTE — Progress Notes (Signed)
ANTICOAGULATION CONSULT NOTE  Pharmacy Consult for Heparin infusion Indication:  mesenteric, portal, and splenic vein thromboses, h/o CVA  Allergies  Allergen Reactions   Amoxicillin Other (See Comments)    Tolerated Zosyn Has patient had a PCN reaction causing immediate rash, facial/tongue/throat swelling, SOB or lightheadedness with hypotension: No Has patient had a PCN reaction causing severe rash involving mucus membranes or skin necrosis: No Has patient had a PCN reaction that required hospitalization No Has patient had a PCN reaction occurring within the last 10 years: No If all of the above answers are "NO", then may proceed with Cephalosporin use.    Patient Measurements: Height: 5\' 2"  (157.5 cm) Weight: 97.5 kg (215 lb) IBW/kg (Calculated) : 50.1 Heparin Dosing Weight: 73.1 kg  Vital Signs: Temp: 97.7 F (36.5 C) (08/12 1417) Temp Source: Oral (08/12 1417) BP: 138/86 (08/12 1417) Pulse Rate: 93 (08/12 1417)  Labs: Recent Labs    12/07/22 0141 12/07/22 0832 12/07/22 1745  HGB 13.7  --   --   HCT 42.8  --   --   PLT 279  --   --   APTT  --  32  --   LABPROT  --  16.3*  --   INR  --  1.3*  --   HEPARINUNFRC  --   --  0.47  CREATININE 0.85  --   --   TROPONINIHS 10  --   --     Estimated Creatinine Clearance: 64.3 mL/min (by C-G formula based on SCr of 0.85 mg/dL).   Medical History: Past Medical History:  Diagnosis Date   Anxiety    Arthritis    Depression    GERD (gastroesophageal reflux disease)    Myocardial infarction (HCC) 89   no visits to cardiac dr(thomas kelly) since 92   Stroke (HCC)    Wound dehiscence    lumbar    Medications:  Medications Prior to Admission  Medication Sig Dispense Refill Last Dose   atorvastatin (LIPITOR) 80 MG tablet Take 1 tablet (80 mg total) by mouth daily. 90 tablet 3    baclofen (LIORESAL) 10 MG tablet TAKE 1 TABLET BY MOUTH 3 TIMES DAILY 30 tablet 5    calcium carbonate (TUMS EX) 750 MG chewable tablet  Chew 1 tablet by mouth 2 (two) times daily as needed for heartburn.      clopidogrel (PLAVIX) 75 MG tablet Take 1 tablet (75 mg total) by mouth daily. Please call [phone] to schedule an overdue appointment for future refills. Thank you. 30 tablet 0    docusate sodium (COLACE) 100 MG capsule Take 100 mg by mouth 2 (two) times daily.      escitalopram (LEXAPRO) 20 MG tablet Take 1 tablet (20 mg total) by mouth daily. 90 tablet 3    ezetimibe (ZETIA) 10 MG tablet Take 1 tablet (10 mg total) by mouth daily. 30 tablet 3    famotidine (PEPCID) 20 MG tablet Take 20 mg by mouth daily as needed for heartburn or indigestion.      ondansetron (ZOFRAN) 4 MG tablet Take 1 tablet (4 mg total) by mouth every 8 (eight) hours as needed for nausea or vomiting. 20 tablet 0    oxyCODONE-acetaminophen (PERCOCET) 10-325 MG tablet TAKE 1 TABLET BY MOUTH 5 TIMES DAILY 150 tablet 0    predniSONE (DELTASONE) 5 MG tablet TAKE 1 TABLET BY MOUTH DAILY 90 tablet 3    rOPINIRole (REQUIP) 3 MG tablet TAKE 1 TABLET BY MOUTH AT BEDTIME AS NEEDED  90 tablet 3    tirzepatide (MOUNJARO) 2.5 MG/0.5ML Pen Inject 2.5 mg into the skin once a week. 2 mL 0    tirzepatide (MOUNJARO) 5 MG/0.5ML Pen Inject 5 mg into the skin once a week. 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 Pt has a history of CVA in 06/21 along with previous LV apical thrombus in 06/21. Pt was treated with apixaban for LV thrombus, but apixaban was de-escalated to aspirin and clopidogrel in 08/23 due to resolution of LV thrombus on f/u imaging. Of note, patient had a previously low-medium positive anticoardiolipin antibody IgM when tested in 2018.  Pharmacy has been consulted to dose heparin infusion for treatment of mesenteric, portal, and splenic vein thromboses.    Hgb 13.7, Plt 279  No s/sx of bleeding noted  PM update: initial heparin level is therapeutic at 0.47. No issues with the infusion or bleeding noted.   Goal of Therapy:  Heparin level 0.3-0.7 units/ml Monitor platelets by anticoagulation protocol: Yes   Plan:  Continue heparin infusion at 1250 units/hr  Check heparin level in 8 hours Monitor daily CBC, heparin level, and for s/sx of bleeding F/u plans for any further procedures or interventions and ability to transition to oral [facility]     Thank you for allowing pharmacy to be a part of this patient's care.   Signe Colt, PharmD 12/07/2022 7:07 PM  **Pharmacist phone directory can be found on [email or web address] listed under [facility]**

## 2022-12-07 NOTE — ED Provider Notes (Signed)
  Physical Exam  BP 130/60 (BP Location: Right Arm)   Pulse 88   Temp 98.1 F (36.7 C) (Oral)   Resp 14   SpO2 99%   Physical Exam  Procedures  Procedures  ED Course / MDM    Medical Decision Making Amount and/or Complexity of Data Reviewed Labs: ordered. Radiology: ordered. ECG/medicine tests: ordered.  Risk OTC drugs. Prescription drug management.   Past Medical History:  Diagnosis Date   Anxiety    Arthritis    Depression    GERD (gastroesophageal reflux disease)    Myocardial infarction (HCC) 89   no visits to cardiac dr(thomas kelly) since 92   Stroke (HCC)    Wound dehiscence    lumbar     Received patient signout.  Abdominal pain  Some diarrhea.  CT scan done and does show a fair amount of mesentery and portal vein clots.  States she has had a clot around her heart previously.  States she had been on Eliquis for it.  Reviewed note from cardiology appears that she has had a stroke previously.  Also has had LV thrombus. I think will require admission to hospital.  Will require anticoagulation but for now we will hold off pending potential further lab test.     Benjiman Core, MD 12/07/22 [phone]

## 2022-12-07 NOTE — Consult Note (Addendum)
Hospital Consult    Reason for Consult:  mesenteric venous thrombosis Requesting Physician:  Katrinka Blazing MRN #:  [id number]  History of Present Illness: This is a 72 y.o. female who presented to the hospital with abdominal pain.  She states she was doing well until she took the injection of Mounjaro and the next day developed significant diarrhea and abdominal pain. She states this has been going on for about 5 days.  She has not been able to eat and therefore the diarrhea has improved.  She states she does get some pain with eating but doesn't sound as if this is what causes her pain as it is also present when she wasn't eating.  She states that she was on Eliquis about 8 years ago for LV thrombus.  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  She denies any family hx of clotting issues.  She does not see a Hematologist.  She is on plavix for hx of stroke and dizziness.   She denies any claudication sx or non healing wounds.  She does not smoke cigarettes but does vape.    The pt is on a statin for cholesterol management.  The pt is on a daily aspirin.   Other AC:  Plavix The pt is not on medication for hypertension.   The pt is is on medication for diabetes PTA. Tobacco hx:  vaping; former smoker  Past Medical History:  Diagnosis Date   Anxiety    Arthritis    Depression    GERD (gastroesophageal reflux disease)    Myocardial infarction (HCC) 89   no visits to cardiac dr(thomas kelly) since 92   Stroke (HCC)    Wound dehiscence    lumbar    Past Surgical History:  Procedure Laterality Date   APPENDECTOMY     17 yrs   BACK SURGERY     BREAST SURGERY Left    cyst   BUBBLE STUDY  03/12/2020   Procedure: BUBBLE STUDY;  Surgeon: Orpah Cobb, MD;  Location: [facility] ENDOSCOPY;  Service: Cardiovascular;;   CARDIAC CATHETERIZATION  38   CHOLECYSTECTOMY     72 yrs old   LUMBAR LAMINECTOMY/DECOMPRESSION MICRODISCECTOMY N/A 06/14/2015   Procedure: Lumbar Three-Four,Lumbar Four-Five,  Lumbar Five-Sacral One Laminectomy;  Surgeon: Tia Alert, MD;  Location: [facility] NEURO ORS;  Service: Neurosurgery;  Laterality: N/A;   LUMBAR WOUND DEBRIDEMENT N/A 07/24/2015   Procedure: lumbar wound revision;  Surgeon: Tia Alert, MD;  Location: [facility] NEURO ORS;  Service: Neurosurgery;  Laterality: N/A;   TEE WITHOUT CARDIOVERSION N/A 03/12/2020   Procedure: TRANSESOPHAGEAL ECHOCARDIOGRAM (TEE);  Surgeon: Orpah Cobb, MD;  Location[facility] ENDOSCOPY;  Service: Cardiovascular;  Laterality: N/A;   TOTAL HIP ARTHROPLASTY Right 10/25/2015   Procedure: RIGHT TOTAL HIP ARTHROPLASTY ANTERIOR APPROACH;  Surgeon: Kathryne Hitch, MD;  Location: [facility] ORS;  Service: Orthopedics;  Laterality: Right;   TUBAL LIGATION      Allergies  Allergen Reactions   Amoxicillin Other (See Comments)    Tolerated Zosyn Has patient had a PCN reaction causing immediate rash, facial/tongue/throat swelling, SOB or lightheadedness with hypotension: No Has patient had a PCN reaction causing severe rash involving mucus membranes or skin necrosis: No Has patient had a PCN reaction that required hospitalization No Has patient had a PCN reaction occurring within the last 10 years: No If all of the above answers are "NO", then may proceed with Cephalosporin use.    Prior to Admission medications  Medication Sig Start Date End Date Taking? Authorizing Provider  atorvastatin (LIPITOR) 80 MG tablet Take 1 tablet (80 mg total) by mouth daily. 10/30/21   Ardith Dark, MD  baclofen (LIORESAL) 10 MG tablet TAKE 1 TABLET BY MOUTH 3 TIMES DAILY 10/19/22   Ardith Dark, MD  calcium carbonate (TUMS EX) 750 MG chewable tablet Chew 1 tablet by mouth 2 (two) times daily as needed for heartburn.    [provider]  clopidogrel (PLAVIX) 75 MG tablet Take 1 tablet (75 mg total) by mouth daily. Please call [phone] to schedule an overdue appointment for future refills. Thank you. 12/04/22   Corky Crafts, MD  docusate  sodium (COLACE) 100 MG capsule Take 100 mg by mouth 2 (two) times daily.    [provider]  escitalopram (LEXAPRO) 20 MG tablet Take 1 tablet (20 mg total) by mouth daily. 02/03/22   Ardith Dark, MD  ezetimibe (ZETIA) 10 MG tablet Take 1 tablet (10 mg total) by mouth daily. 08/19/22   Ardith Dark, MD  famotidine (PEPCID) 20 MG tablet Take 20 mg by mouth daily as needed for heartburn or indigestion.    [provider]  ondansetron (ZOFRAN) 4 MG tablet Take 1 tablet (4 mg total) by mouth every 8 (eight) hours as needed for nausea or vomiting. 12/01/22   Ardith Dark, MD  oxyCODONE-acetaminophen (PERCOCET) 10-325 MG tablet TAKE 1 TABLET BY MOUTH 5 TIMES DAILY 11/25/22   Ardith Dark, MD  predniSONE (DELTASONE) 5 MG tablet TAKE 1 TABLET BY MOUTH DAILY 11/02/22   Ardith Dark, MD  rOPINIRole (REQUIP) 3 MG tablet TAKE 1 TABLET BY MOUTH AT BEDTIME AS NEEDED 09/23/22   Ardith Dark, MD  tirzepatide [facility]) 2.5 MG/0.5ML Pen Inject 2.5 mg into the skin once a week. 11/10/22   Ardith Dark, MD  tirzepatide [facility]) 5 MG/0.5ML Pen Inject 5 mg into the skin once a week. 11/10/22   Ardith Dark, MD    Social History   Socioeconomic History   Marital status: Married    Spouse name: Yee Poorman   Number of children: 4   Years of education: GED   Highest education level: GED or equivalent  Occupational History   Occupation: retired   Tobacco Use   Smoking status: Former    Current packs/day: 0.00    Average packs/day: 2.0 packs/day for 40.0 years (80.0 ttl pk-yrs)    Types: Cigarettes    Start date: 04/10/1973    Quit date: 04/10/2013    Years since quitting: 9.6   Smokeless tobacco: Never  Vaping Use   Vaping status: Former   Start date: 04/11/2013   Substances: Nicotine, Nicotine-salt  Substance and Sexual Activity   Alcohol use: Not Currently    Comment: rarely   Drug use: No   Sexual activity: Not Currently  Other Topics Concern   Not on file   Social History Narrative   Lives with husband   Right Handed   Drinks1-2 cups caffeine daily   Social Determinants of Health   Financial Resource Strain: Low Risk  (11/05/2022)   Overall Financial Resource Strain (CARDIA)    Difficulty of Paying Living Expenses: Not hard at all  Food Insecurity: No Food Insecurity (11/05/2022)   Hunger Vital Sign    Worried About Running Out of Food in the Last Year: Never true    Ran Out of Food in the Last Year: Never true  Transportation Needs: No  Transportation Needs (11/05/2022)   PRAPARE - Administrator, Civil Service (Medical): No    Lack of Transportation (Non-Medical): No  Physical Activity: Inactive (11/05/2022)   Exercise Vital Sign    Days of Exercise per Week: 0 days    Minutes of Exercise per Session: 0 min  Stress: No Stress Concern Present (11/05/2022)   [employer] of Occupational Health - Occupational Stress Questionnaire    Feeling of Stress : Only a little  Social Connections: Moderately Isolated (11/05/2022)   Social Connection and Isolation Panel [NHANES]    Frequency of Communication with Friends and Family: More than three times a week    Frequency of Social Gatherings with Friends and Family: Once a week    Attends Religious Services: Never    Database administrator or Organizations: No    Attends Banker Meetings: Never    Marital Status: Married  Catering manager Violence: Not At Risk (11/05/2022)   Humiliation, Afraid, Rape, and Kick questionnaire    Fear of Current or Ex-Partner: No    Emotionally Abused: No    Physically Abused: No    Sexually Abused: No     Family History  Problem Relation Age of Onset   Stroke Mother    Clotting disorder Neg Hx     ROS: [x]  Positive   [ ]  Negative   [ ]  All sytems reviewed and are negative  Cardiac: []  chest pain/pressure [x]  hx MI []  SOB   Vascular: []  pain in legs while walking []  pain in legs at rest []  pain in legs at night []   non-healing ulcers []  hx of DVT []  swelling in legs  Pulmonary: []  asthma/wheezing []  home O2  Neurologic: [x]  hx of CVA []  mini stroke   Hematologic: []  hx of cancer  Endocrine:   []  diabetes []  thyroid disease  GI []  GERD [x]  abdominal pain/diarrhea  GU: []  CKD/renal failure []  HD--[]  M/W/F or []  T/T/S  Psychiatric: []  anxiety []  depression  Musculoskeletal: []  arthritis []  joint pain  Integumentary: []  rashes []  ulcers  Constitutional: []  fever  []  chills  Physical Examination  Vitals:   12/07/22 0423 12/07/22 0733  BP: (!) 159/70 130/60  Pulse: 90 88  Resp: 20 14  Temp: 98.6 F (37 C) 98.1 F (36.7 C)  SpO2: 100% 99%   Body mass index is 39.32 kg/m.  General:  WDWN in NAD Gait: Not observed HENT: WNL, normocephalic Pulmonary: normal non-labored breathing Cardiac: regular Abdomen:  obese/soft, NT; aortic pulse is not palpable Skin: without rashes Vascular Exam/Pulses: Pedal pulses are not palpable Extremities: bilateral feet are warm and well perfused Musculoskeletal: no muscle wasting or atrophy  Neurologic: A&O X 3 Psychiatric:  The pt has Normal affect.   CBC    Component Value Date/Time   WBC 10.3 12/07/2022 0141   RBC 4.52 12/07/2022 0141   HGB 13.7 12/07/2022 0141   HCT 42.8 12/07/2022 0141   PLT 279 12/07/2022 0141   MCV 94.7 12/07/2022 0141   MCH 30.3 12/07/2022 0141   MCHC 32.0 12/07/2022 0141   RDW 12.6 12/07/2022 0141   LYMPHSABS 1.6 12/07/2022 0141   MONOABS 1.0 12/07/2022 0141   EOSABS 0.0 12/07/2022 0141   BASOSABS 0.1 12/07/2022 0141    BMET    Component Value Date/Time   NA 136 12/07/2022 0141   K 3.7 12/07/2022 0141   CL 99 12/07/2022 0141   CO2 23 12/07/2022 0141   GLUCOSE 115 (H)  12/07/2022 0141   BUN 10 12/07/2022 0141   CREATININE 0.85 12/07/2022 0141   CALCIUM 8.6 (L) 12/07/2022 0141   GFRNONAA >60 12/07/2022 0141   GFRAA >60 10/18/2019 0545    COAGS: Lab Results  Component Value Date    INR 1.2 03/08/2020   INR 1.1 10/01/2019   INR 1.50 10/16/2017     Non-Invasive Vascular Imaging:   CT a/p 12/07/2022 Thrombosed SMV and first order tributaries, main portal vein,proximal left and proximal right portal veins and proximal splenic vein thrombosis.    ASSESSMENT/PLAN: This is a 73 y.o. 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   -most likely no intervention needed but will need anticoagulation.  Heparin gtt has been ordered.  -Dr. Randie Heinz to evaluate pt and determine further plan   Doreatha Massed, PA-C [facility] [phone]  I have independently interviewed and examined patient and agree with PA assessment and plan above.  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  She should continue antiplatelet therapy with aortic atherosclerosis but will also need anticoagulation and currently her symptoms have improved on heparin.  Would likely benefit from outpatient hematology evaluation.  No vascular intervention recommended at this time and will be available as needed.   C. Randie Heinz, MD [facility] [place] Office: [phone] Pager: [phone]

## 2022-12-07 NOTE — ED Provider Notes (Signed)
[place] EMERGENCY DEPARTMENT AT [facility] Provider Note   CSN: [id number] Arrival date & time: 12/07/22  4098     History {Add pertinent medical, surgical, social history, OB history to HPI:1} Chief Complaint  Patient presents with   Abdominal Pain    DEBARAH MCKINZIE is a 72 y.o. female.   Abdominal Pain      Home Medications Prior to Admission medications   Medication Sig Start Date End Date Taking? Authorizing Provider  atorvastatin (LIPITOR) 80 MG tablet Take 1 tablet (80 mg total) by mouth daily. 10/30/21   Ardith Dark, MD  baclofen (LIORESAL) 10 MG tablet TAKE 1 TABLET BY MOUTH 3 TIMES DAILY 10/19/22   Ardith Dark, MD  calcium carbonate (TUMS EX) 750 MG chewable tablet Chew 1 tablet by mouth 2 (two) times daily as needed for heartburn.    [provider]  clopidogrel (PLAVIX) 75 MG tablet Take 1 tablet (75 mg total) by mouth daily. Please call [phone] to schedule an overdue appointment for future refills. Thank you. 12/04/22   Corky Crafts, MD  docusate sodium (COLACE) 100 MG capsule Take 100 mg by mouth 2 (two) times daily.    [provider]  escitalopram (LEXAPRO) 20 MG tablet Take 1 tablet (20 mg total) by mouth daily. 02/03/22   Ardith Dark, MD  ezetimibe (ZETIA) 10 MG tablet Take 1 tablet (10 mg total) by mouth daily. 08/19/22   Ardith Dark, MD  famotidine (PEPCID) 20 MG tablet Take 20 mg by mouth daily as needed for heartburn or indigestion.    [provider]  ondansetron (ZOFRAN) 4 MG tablet Take 1 tablet (4 mg total) by mouth every 8 (eight) hours as needed for nausea or vomiting. 12/01/22   Ardith Dark, MD  oxyCODONE-acetaminophen (PERCOCET) 10-325 MG tablet TAKE 1 TABLET BY MOUTH 5 TIMES DAILY 11/25/22   Ardith Dark, MD  predniSONE (DELTASONE) 5 MG tablet TAKE 1 TABLET BY MOUTH DAILY 11/02/22   Ardith Dark, MD  rOPINIRole (REQUIP) 3 MG tablet TAKE 1 TABLET BY MOUTH AT BEDTIME AS NEEDED 09/23/22    Ardith Dark, MD  tirzepatide [facility]) 2.5 MG/0.5ML Pen Inject 2.5 mg into the skin once a week. 11/10/22   Ardith Dark, MD  tirzepatide [facility]) 5 MG/0.5ML Pen Inject 5 mg into the skin once a week. 11/10/22   Ardith Dark, MD      Allergies    Amoxicillin    Review of Systems   Review of Systems  Gastrointestinal:  Positive for abdominal pain.    Physical Exam Updated Vital Signs BP (!) 159/70 (BP Location: Right Arm)   Pulse 90   Temp 98.6 F (37 C) (Oral)   Resp 20   SpO2 100%  Physical Exam  ED Results / Procedures / Treatments   Labs (all labs ordered are listed, but only abnormal results are displayed) Labs Reviewed  COMPREHENSIVE METABOLIC PANEL - Abnormal; Notable for the following components:      Result Value   Glucose, Bld 115 (*)    Calcium 8.6 (*)    Albumin 2.8 (*)    AST 45 (*)    Total Bilirubin 1.3 (*)    All other components within normal limits  CBC WITH DIFFERENTIAL/PLATELET  LIPASE, BLOOD  TROPONIN I (HIGH SENSITIVITY)    EKG EKG Interpretation Date/Time:  Monday December 07 2022 00:44:02 EDT Ventricular Rate:  86 PR Interval:  147 QRS Duration:  85 QT Interval:  389 QTC Calculation: 466 R Axis:   41  Text Interpretation: Sinus rhythm Low voltage, precordial leads Abnormal R-wave progression, early transition Confirmed by Marily Memos [phone]) on 12/07/2022 4:01:49 AM  Radiology No results found.  Procedures Procedures  {Document cardiac monitor, telemetry assessment procedure when appropriate:1}  Medications Ordered in ED Medications  alum & mag hydroxide-simeth (MAALOX/MYLANTA) 200-200-20 MG/5ML suspension 30 mL (30 mLs Oral Given 12/07/22 0201)  fentaNYL (SUBLIMAZE) injection 50 mcg (50 mcg Intravenous Given 12/07/22 0203)  famotidine (PEPCID) tablet 20 mg (20 mg Oral Given 12/07/22 0204)  pantoprazole (PROTONIX) injection 40 mg (40 mg Intravenous Given 12/07/22 0201)  lactated ringers bolus 500 mL (0 mLs Intravenous  Stopped 12/07/22 0411)  HYDROmorphone (DILAUDID) injection 0.5 mg (0.5 mg Intravenous Given 12/07/22 0513)  iohexol (OMNIPAQUE) 350 MG/ML injection 75 mL (75 mLs Intravenous Contrast Given 12/07/22 0610)    ED Course/ Medical Decision Making/ A&P   {   Click here for ABCD2, HEART and other calculatorsREFRESH Note before signing :1}                              Medical Decision Making Amount and/or Complexity of Data Reviewed Labs: ordered. Radiology: ordered. ECG/medicine tests: ordered.  Risk OTC drugs. Prescription drug management.   ***  {Document critical care time when appropriate:1} {Document review of labs and clinical decision tools ie heart score, Chads2Vasc2 etc:1}  {Document your independent review of radiology images, and any outside records:1} {Document your discussion with family members, caretakers, and with consultants:1} {Document social determinants of health affecting pt's care:1} {Document your decision making why or why not admission, treatments were needed:1} Final Clinical Impression(s) / ED Diagnoses Final diagnoses:  None    Rx / DC Orders ED Discharge Orders     None

## 2022-12-07 NOTE — ED Notes (Signed)
Assisted patient to bathroom in wheelchair. States she normally walks with a walker.

## 2022-12-07 NOTE — ED Notes (Signed)
ED TO INPATIENT HANDOFF REPORT  ED Nurse Name and Phone #:  [phone]  S Name/Age/Gender [name] Good 72 y.o. female Room/Bed: 033C/033C  Code Status   Code Status: Full Code  Home/SNF/Other Home Patient oriented to: self, place, time, and situation Is this baseline? 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  Pt started mounjaro on 8/1 and has "not felt right since"  Abdominal pain noted. Pt has taken oxycodone and benadryl tonight for pain.  Nausea, no vomiting. Has had some diarrhea    Allergies Allergies  Allergen Reactions   Amoxicillin Other (See Comments)    Tolerated Zosyn Has patient had a PCN reaction causing immediate rash, facial/tongue/throat swelling, SOB or lightheadedness with hypotension: No Has patient had a PCN reaction causing severe rash involving mucus membranes or skin necrosis: No Has patient had a PCN reaction that required hospitalization No Has patient had a PCN reaction occurring within the last 10 years: No If all of the above answers are "NO", then may proceed with Cephalosporin use.    Level of Care/Admitting Diagnosis ED Disposition     ED Disposition  Admit   Condition  --   Comment  Hospital Area: [facility] [[id number]]  Level of Care: Telemetry Medical [104]  May admit patient to [facility] or [facility] if equivalent level of care is available:: No  Covid Evaluation: Asymptomatic - no recent exposure (last 10 days) testing not required  Diagnosis: Superior mesenteric vein thrombosis [facility]) [[id number]]  Admitting Physician: Clydie Braun [[id number]]  Attending Physician: Clydie Braun [[id number]]  Certification:: I certify this patient will need inpatient services for at least 2 midnights  Estimated Length of Stay: 2          B Medical/Surgery History Past  Medical History:  Diagnosis Date   Anxiety    Arthritis    Depression    GERD (gastroesophageal reflux disease)    Myocardial infarction (HCC) 89   no visits to cardiac dr(thomas kelly) since 89   Stroke (HCC)    Wound dehiscence    lumbar   Past Surgical History:  Procedure Laterality Date   APPENDECTOMY     17 yrs   BACK SURGERY     BREAST SURGERY Left    cyst   BUBBLE STUDY  03/12/2020   Procedure: BUBBLE STUDY;  Surgeon: Orpah Cobb, MD;  Location: [facility] ENDOSCOPY;  Service: Cardiovascular;;   CARDIAC CATHETERIZATION  37   CHOLECYSTECTOMY     72 yrs old   LUMBAR LAMINECTOMY/DECOMPRESSION MICRODISCECTOMY N/A 06/14/2015   Procedure: Lumbar Three-Four,Lumbar Four-Five, Lumbar Five-Sacral One Laminectomy;  Surgeon: Tia Alert, MD;  Location: [facility] NEURO ORS;  Service: Neurosurgery;  Laterality: N/A;   LUMBAR WOUND DEBRIDEMENT N/A 07/24/2015   Procedure: lumbar wound revision;  Surgeon: Tia Alert, MD;  Location: [facility] NEURO ORS;  Service: Neurosurgery;  Laterality: N/A;   TEE WITHOUT CARDIOVERSION N/A 03/12/2020   Procedure: TRANSESOPHAGEAL ECHOCARDIOGRAM (TEE);  Surgeon: Orpah Cobb, MD;  Location: [facility] ENDOSCOPY;  Service: Cardiovascular;  Laterality: N/A;   TOTAL HIP ARTHROPLASTY Right 10/25/2015   Procedure: RIGHT TOTAL HIP ARTHROPLASTY ANTERIOR APPROACH;  Surgeon: Kathryne Hitch, MD;  Location: [facility] ORS;  Service: Orthopedics;  Laterality: Right;   TUBAL LIGATION       A IV Location/Drains/Wounds Patient Lines/Drains/Airways Status     Active Line/Drains/Airways  Name Placement date Placement time Site Days   Peripheral IV 12/07/22 20 G 1.16" Distal;Posterior;Right Forearm 12/07/22  0051  Forearm  less than 1            Intake/Output Last 24 hours No intake or output data in the 24 hours ending 12/07/22 1332  Labs/Imaging Results for orders placed or performed during the hospital encounter of 12/07/22 (from the past 48 hour(s))  CBC with Differential      Status: None   Collection Time: 12/07/22  1:41 AM  Result Value Ref Range   WBC 10.3 4.0 - 10.5 K/uL   RBC 4.52 3.87 - 5.11 MIL/uL   Hemoglobin 13.7 12.0 - 15.0 g/dL   HCT 02.7 25.3 - 66.4 %   MCV 94.7 80.0 - 100.0 fL   MCH 30.3 26.0 - 34.0 pg   MCHC 32.0 30.0 - 36.0 g/dL   RDW 40.3 47.4 - 25.9 %   Platelets 279 150 - 400 K/uL   nRBC 0.0 0.0 - 0.2 %   Neutrophils Relative % 74 %   Neutro Abs 7.6 1.7 - 7.7 K/uL   Lymphocytes Relative 15 %   Lymphs Abs 1.6 0.7 - 4.0 K/uL   Monocytes Relative 9 %   Monocytes Absolute 1.0 0.1 - 1.0 K/uL   Eosinophils Relative 0 %   Eosinophils Absolute 0.0 0.0 - 0.5 K/uL   Basophils Relative 1 %   Basophils Absolute 0.1 0.0 - 0.1 K/uL   Immature Granulocytes 1 %   Abs Immature Granulocytes 0.05 0.00 - 0.07 K/uL    Comment: Performed at [facility], [address] [place], [place] [address]  Comprehensive metabolic panel     Status: Abnormal   Collection Time: 12/07/22  1:41 AM  Result Value Ref Range   Sodium 136 135 - 145 mmol/L   Potassium 3.7 3.5 - 5.1 mmol/L    Comment: HEMOLYSIS AT THIS LEVEL MAY AFFECT RESULT   Chloride 99 98 - 111 mmol/L   CO2 23 22 - 32 mmol/L   Glucose, Bld 115 (H) 70 - 99 mg/dL    Comment: Glucose reference range applies only to samples taken after fasting for at least 8 hours.   BUN 10 8 - 23 mg/dL   Creatinine, Ser 5.64 0.44 - 1.00 mg/dL   Calcium 8.6 (L) 8.9 - 10.3 mg/dL   Total Protein 7.0 6.5 - 8.1 g/dL   Albumin 2.8 (L) 3.5 - 5.0 g/dL   AST 45 (H) 15 - 41 U/L    Comment: HEMOLYSIS AT THIS LEVEL MAY AFFECT RESULT   ALT 24 0 - 44 U/L    Comment: HEMOLYSIS AT THIS LEVEL MAY AFFECT RESULT   Alkaline Phosphatase 116 38 - 126 U/L   Total Bilirubin 1.3 (H) 0.3 - 1.2 mg/dL    Comment: HEMOLYSIS AT THIS LEVEL MAY AFFECT RESULT   GFR, Estimated >60 >60 mL/min    Comment: (NOTE) Calculated using the CKD-EPI Creatinine Equation (2021)    Anion gap 14 5 - 15    Comment: Performed at [facility]  Lab, [address], [place], [place] [address]  Lipase, blood     Status: None   Collection Time: 12/07/22  1:41 AM  Result Value Ref Range   Lipase 39 11 - 51 U/L    Comment: Performed at [facility], [address] [place], [place] [address]  Troponin I (High Sensitivity)     Status: None   Collection  Time: 12/07/22  1:41 AM  Result Value Ref Range   Troponin I (High Sensitivity) 10 <18 ng/L    Comment: (NOTE) Elevated high sensitivity troponin I (hsTnI) values and significant  changes across serial measurements may suggest ACS but many other  chronic and acute conditions are known to elevate hsTnI results.  Refer to the "Links" section for chest pain algorithms and additional  guidance. Performed at [facility], [address], [place], [place] [address]   Urinalysis, Routine w reflex microscopic -Urine, Clean Catch     Status: Abnormal   Collection Time: 12/07/22  8:12 AM  Result Value Ref Range   Color, Urine YELLOW YELLOW   APPearance CLEAR CLEAR   Specific Gravity, Urine >1.046 (H) 1.005 - 1.030   pH 7.0 5.0 - 8.0   Glucose, UA NEGATIVE NEGATIVE mg/dL   Hgb urine dipstick SMALL (A) NEGATIVE   Bilirubin Urine NEGATIVE NEGATIVE   Ketones, ur 5 (A) NEGATIVE mg/dL   Protein, ur NEGATIVE NEGATIVE mg/dL   Nitrite NEGATIVE NEGATIVE   Leukocytes,Ua SMALL (A) NEGATIVE   RBC / HPF 0-5 0 - 5 RBC/hpf   WBC, UA 0-5 0 - 5 WBC/hpf   Bacteria, UA MANY (A) NONE SEEN   Squamous Epithelial / HPF 0-5 0 - 5 /HPF   Mucus PRESENT     Comment: Performed at [facility], [address] [place], [place] [address]  Protime-INR     Status: Abnormal   Collection Time: 12/07/22  8:32 AM  Result Value Ref Range   Prothrombin Time 16.3 (H) 11.4 - 15.2 seconds   INR 1.3 (H) 0.8 - 1.2    Comment: (NOTE) INR goal varies based on device and disease states. Performed at [facility], [address] [place], [place] [address]   APTT     Status: None   Collection Time:  12/07/22  8:32 AM  Result Value Ref Range   aPTT 32 24 - 36 seconds    Comment: Performed at [facility], [address] [place], [place] [address]   US Abdomen Limited RUQ (LIVER/GB)  Result Date: 12/07/2022 CLINICAL DATA:  Epigastric pain.  Prior cholecystectomy. EXAM: ULTRASOUND ABDOMEN LIMITED RIGHT UPPER QUADRANT COMPARISON:  Abdomen and pelvis CT 12/07/2022 FINDINGS: Gallbladder: Surgically absent. Common bile duct: Diameter: 4-5 mm Liver: Echogenic liver parenchyma with heterogeneous echotexture diffusely. Portal vein not well visualized consistent with thrombosis reported on CT imaging performed just prior to this exam. Other: None. IMPRESSION: 1. Echogenic liver parenchyma with heterogeneous echotexture. Imaging features suggest hepatic steatosis. 2. Portal vein not well visualized consistent with thrombosis reported on CT imaging performed just prior to this exam. Electronically Signed   By: Kennith Center M.D.   On: 12/07/2022 08:09   CT ABDOMEN PELVIS W CONTRAST  Result Date: 12/07/2022 CLINICAL DATA:  Abdominal pain following Tirzepatide injection on August 1. Occasional diarrhea. EXAM: CT ABDOMEN AND PELVIS WITH CONTRAST TECHNIQUE: Multidetector CT imaging of the abdomen and pelvis was performed using the standard protocol following bolus administration of intravenous contrast. RADIATION DOSE REDUCTION: This exam was performed according to the departmental dose-optimization program which includes automated exposure control, adjustment of the mA and/or kV according to patient size and/or use of iterative reconstruction technique. CONTRAST:  75mL OMNIPAQUE IOHEXOL 350 MG/ML SOLN COMPARISON:  None Available. FINDINGS: Lower chest: Cardiac size is normal. There is calcification in the circumflex coronary artery. Lung bases are clear of infiltrates. Hepatobiliary: The liver is  moderately steatotic. The left lobe is larger than the right which may be seen in various stages of cirrhosis with  slightly lobular contour extending over portions of the hepatic undersurface. There are regional perfusion differences which are most likely due to also noted portal vein thrombosis. There is no mass enhancement. The gallbladder is absent, with no biliary dilatation. Pancreas: Partially atrophic. There are scattered punctate parenchymal calcifications that are compatible with chronic calcific pancreatitis. Edema in the mesenteric root fat does extend up to the pancreatic head but this is probably related to SMV thrombosis rather than pancreatitis. Correlation with serum lipase is suggested. There is no mass enhancement or ductal dilatation. Spleen: No splenomegaly or mass. Adrenals/Urinary Tract: No adrenal mass or renal mass enhancement. There is patchy cortical scarring and asymmetric volume loss in the left kidney, small wedge-shaped scar-like defect lower pole right kidney. There is no urinary stone or obstruction. Both of the renal veins and arteries opacify but there are suspected debris atherosclerotic origin stenoses of both renal arteries. There is mild bladder thickening versus underdistention. The bladder is not optimally demonstrated due to metal artifact from a right hip replacement. Stomach/Bowel: Small hiatal hernia. The stomach is contracted. There are small descending duodenal diverticula. The rest of the small bowel is unremarkable. No dilatation or inflammatory change is seen. An appendix is not seen in this patient. There are left colonic diverticula heaviest in the distal descending and sigmoid but no wall thickening or inflammatory change. Vascular/Lymphatic: There is thrombosis in the SMV and first order tributaries, continuing up into the main portal vein and the proximal left and proximal right portal veins, and into the proximal splenic vein. Flow is seen in the mid to distal splenic vein and its hilar tributaries. Systemic venous opacification is preserved including in the pelvis. There is  aortoiliac and visceral branch vessel atherosclerosis. No enlarged lymph nodes are seen. Reproductive: Uterus and bilateral adnexa are unremarkable. Other: There is mesenteric edema primarily in the root fat extending into the lower abdomen and ileocolic mesentery as well. This is almost certainly congestive or due to the SMV thrombosis. Inflammatory component is not strictly excluded. There is minimal pelvic ascites and minimal abdominal ascites in the pericolic gutters but there is no drainable pocket. There is no free air. No bowel pneumatosis. Musculoskeletal: Osteopenia with advanced degenerative change of the thoracic and lumbar spine. Degenerative change left hip with right hip replacement. Acquired spinal stenosis at L4-5 and L3-4 as well as multilevel lumbar acquired foraminal stenosis. There is moderate lower plate wedging of the L2 vertebral body. There is a well corticated bone fragment at the anterior aspect of the L3 body. Findings most likely reflect remote trauma. No acute or other significant osseous findings. Slight levoscoliosis. IMPRESSION: 1. Thrombosed SMV and first order tributaries, main portal vein, proximal left and proximal right portal veins and proximal splenic vein thrombosis. 2. Mesenteric edema primarily in the root fat extending into the lower abdomen and ileocolic mesentery. This is almost certainly congestive due to the SMV thrombosis. Inflammatory component is not strictly excluded. There is no bowel pneumatosis or free air. 3. Minimal abdominal and pelvic ascites. 4. Moderate hepatic steatosis with lobular hepatic contour which may be seen in various stages of cirrhosis. No mass enhancement. Slight lobulation of portions of the hepatic undersurface. 5. Chronic calcific pancreatitis. No findings of acute pancreatitis. 6. Aortic and coronary artery atherosclerosis. 7. Left greater than right renal cortical scarring and volume loss. Suspected flow-limiting bilateral renal artery  origin stenoses. 8. Cystitis versus bladder nondistention. 9. Osteopenia and degenerative change. 10. Remaining findings described above. 11. These results will be called to the ordering clinician or representative by the Radiologist Assistant, and communication documented in the PACS or [employer]. Aortic Atherosclerosis (ICD10-I70.0). Electronically Signed   By: Almira Bar M.D.   On: 12/07/2022 07:35    Pending Labs Unresulted Labs (From admission, onward)     Start     Ordered   12/08/22 0500  Comprehensive metabolic panel  Tomorrow morning,   R        12/07/22 0826   12/08/22 0500  CBC  Daily,   R      12/07/22 0853   12/08/22 0500  Heparin level (unfractionated)  Daily,   R      12/07/22 0853   12/07/22 1700  Heparin level (unfractionated)  Once-Timed,   TIMED        12/07/22 0853            Vitals/Pain Today's Vitals   12/07/22 1100 12/07/22 1114 12/07/22 1122 12/07/22 1140  BP: (!) 142/51     Pulse: 94     Resp: (!) 21     Temp:   98.4 F (36.9 C)   TempSrc:   Oral   SpO2: 97%     Weight:      Height:      PainSc:  7   5     Isolation Precautions No active isolations  Medications Medications  acetaminophen (TYLENOL) tablet 650 mg (has no administration in time range)    Or  acetaminophen (TYLENOL) suppository 650 mg (has no administration in time range)  ondansetron (ZOFRAN) tablet 4 mg (has no administration in time range)    Or  ondansetron (ZOFRAN) injection 4 mg (has no administration in time range)  albuterol (PROVENTIL) (2.5 MG/3ML) 0.083% nebulizer solution 2.5 mg (has no administration in time range)  HYDROmorphone (DILAUDID) injection 1 mg (1 mg Intravenous Given 12/07/22 1116)  heparin ADULT infusion 100 units/mL (25000 units/213mL) (1,250 Units/hr Intravenous New Bag/Given 12/07/22 0920)  escitalopram (LEXAPRO) tablet 20 mg (20 mg Oral Given 12/07/22 1115)  famotidine (PEPCID) tablet 20 mg (has no administration in time range)  rOPINIRole  (REQUIP) tablet 3 mg (has no administration in time range)  docusate sodium (COLACE) capsule 100 mg (100 mg Oral Given 12/07/22 1115)  predniSONE (DELTASONE) tablet 5 mg (5 mg Oral Given 12/07/22 1115)  oxyCODONE (Oxy IR/ROXICODONE) immediate release tablet 10 mg (has no administration in time range)    And  acetaminophen (TYLENOL) tablet 325 mg (has no administration in time range)  alum & mag hydroxide-simeth (MAALOX/MYLANTA) 200-200-20 MG/5ML suspension 30 mL (30 mLs Oral Given 12/07/22 0201)  fentaNYL (SUBLIMAZE) injection 50 mcg (50 mcg Intravenous Given 12/07/22 0203)  famotidine (PEPCID) tablet 20 mg (20 mg Oral Given 12/07/22 0204)  pantoprazole (PROTONIX) injection 40 mg (40 mg Intravenous Given 12/07/22 0201)  lactated ringers bolus 500 mL (0 mLs Intravenous Stopped 12/07/22 0411)  HYDROmorphone (DILAUDID) injection 0.5 mg (0.5 mg Intravenous Given 12/07/22 0513)  iohexol (OMNIPAQUE) 350 MG/ML injection 75 mL (75 mLs Intravenous Contrast Given 12/07/22 0610)  heparin bolus via infusion 5,000 Units (5,000 Units Intravenous Bolus from Bag 12/07/22 0920)    Mobility walks with person assist     Focused Assessments    R Recommendations: See Admitting Provider Note  Report given to:   Additional Notes:

## 2022-12-07 NOTE — H&P (Addendum)
History and Physical    Patient: [name] Good:[id number] DOB: [birth year] DOA: 12/07/2022 DOS: the patient was seen and examined on 12/07/2022 PCP: Ardith Dark, MD  Patient coming from: Home via EMS  Chief Complaint:  Chief Complaint  Patient presents with   Abdominal Pain   HPI: [name] Good is a 72 y.o. female with medical history significant of CAD, CVA, apical aneurysm with LV thrombus, prediabetes, arthritis, chronic pain, anxiety, depression, and GERD who presents with complaints of abdominal pain.  She makes note that symptoms all started after she started [facility] on 8/1.  Over the next today she reported having diarrhea.  Thereafter she reported the diarrhea resolved, but the achy epigastric pain began.  Pain initially waxed and waned in intensity, but over the last few days have become constant.  Noted associated symptoms of nausea and constipation with last bowel movement 6 days ago.  Pain seemed to be worsened after eating or which she has had no significant appetite.  Denies having any significant fever, chills, cough, shortness of breath, vomiting, or dysuria.  She has been able to pass flatus.  In the emergency department patient was noted to be afebrile with blood pressures elevated up to 162/89, all other vital signs maintained.  Labs noted WBC 10.3, albumin 2.8, AST 45, total bilirubin 1.3, and high-sensitivity troponin 10. CT scan of the abdomen pelvis with contrast was most significant for superior mesenteric vein, main portal vein, proximal left/right portal veins, and proximal splenic vein thrombosis.  Patient had been given 500 mL of lactated Ringer's, GI cocktail, PPI, and pain medications.  Review of Systems: As mentioned in the history of present illness. All other systems reviewed and are negative. Past Medical History:  Diagnosis Date   Anxiety    Arthritis    Depression    GERD (gastroesophageal reflux disease)    Myocardial infarction (HCC) 89   no  visits to cardiac dr(thomas kelly) since 89   Stroke (HCC)    Wound dehiscence    lumbar   Past Surgical History:  Procedure Laterality Date   APPENDECTOMY     17 yrs   BACK SURGERY     BREAST SURGERY Left    cyst   BUBBLE STUDY  03/12/2020   Procedure: BUBBLE STUDY;  Surgeon: Orpah Cobb, MD;  Location: [facility] ENDOSCOPY;  Service: Cardiovascular;;   CARDIAC CATHETERIZATION  81   CHOLECYSTECTOMY     72 yrs old   LUMBAR LAMINECTOMY/DECOMPRESSION MICRODISCECTOMY N/A 06/14/2015   Procedure: Lumbar Three-Four,Lumbar Four-Five, Lumbar Five-Sacral One Laminectomy;  Surgeon: Tia Alert, MD;  Location: [facility] NEURO ORS;  Service: Neurosurgery;  Laterality: N/A;   LUMBAR WOUND DEBRIDEMENT N/A 07/24/2015   Procedure: lumbar wound revision;  Surgeon: Tia Alert, MD;  Location: [facility] NEURO ORS;  Service: Neurosurgery;  Laterality: N/A;   TEE WITHOUT CARDIOVERSION N/A 03/12/2020   Procedure: TRANSESOPHAGEAL ECHOCARDIOGRAM (TEE);  Surgeon: Orpah Cobb, MD;  Location[facility] ENDOSCOPY;  Service: Cardiovascular;  Laterality: N/A;   TOTAL HIP ARTHROPLASTY Right 10/25/2015   Procedure: RIGHT TOTAL HIP ARTHROPLASTY ANTERIOR APPROACH;  Surgeon: Kathryne Hitch, MD;  Location: [facility] ORS;  Service: Orthopedics;  Laterality: Right;   TUBAL LIGATION     Social History:  reports that she quit smoking about 9 years ago. Her smoking use included cigarettes. She started smoking about 49 years ago. She has a 80 pack-year smoking history. She has never used smokeless tobacco. She reports that she does not currently use alcohol.  She reports that she does not use drugs.  Allergies  Allergen Reactions   Amoxicillin Other (See Comments)    Tolerated Zosyn Has patient had a PCN reaction causing immediate rash, facial/tongue/throat swelling, SOB or lightheadedness with hypotension: No Has patient had a PCN reaction causing severe rash involving mucus membranes or skin necrosis: No Has patient had a PCN reaction that  required hospitalization No Has patient had a PCN reaction occurring within the last 10 years: No If all of the above answers are "NO", then may proceed with Cephalosporin use.    Family History  Problem Relation Age of Onset   Stroke Mother    Clotting disorder Neg Hx     Prior to Admission medications   Medication Sig Start Date End Date Taking? Authorizing Provider  atorvastatin (LIPITOR) 80 MG tablet Take 1 tablet (80 mg total) by mouth daily. 10/30/21   Ardith Dark, MD  baclofen (LIORESAL) 10 MG tablet TAKE 1 TABLET BY MOUTH 3 TIMES DAILY 10/19/22   Ardith Dark, MD  calcium carbonate (TUMS EX) 750 MG chewable tablet Chew 1 tablet by mouth 2 (two) times daily as needed for heartburn.    [provider]  clopidogrel (PLAVIX) 75 MG tablet Take 1 tablet (75 mg total) by mouth daily. Please call [phone] to schedule an overdue appointment for future refills. Thank you. 12/04/22   Corky Crafts, MD  docusate sodium (COLACE) 100 MG capsule Take 100 mg by mouth 2 (two) times daily.    [provider]  escitalopram (LEXAPRO) 20 MG tablet Take 1 tablet (20 mg total) by mouth daily. 02/03/22   Ardith Dark, MD  ezetimibe (ZETIA) 10 MG tablet Take 1 tablet (10 mg total) by mouth daily. 08/19/22   Ardith Dark, MD  famotidine (PEPCID) 20 MG tablet Take 20 mg by mouth daily as needed for heartburn or indigestion.    [provider]  ondansetron (ZOFRAN) 4 MG tablet Take 1 tablet (4 mg total) by mouth every 8 (eight) hours as needed for nausea or vomiting. 12/01/22   Ardith Dark, MD  oxyCODONE-acetaminophen (PERCOCET) 10-325 MG tablet TAKE 1 TABLET BY MOUTH 5 TIMES DAILY 11/25/22   Ardith Dark, MD  predniSONE (DELTASONE) 5 MG tablet TAKE 1 TABLET BY MOUTH DAILY 11/02/22   Ardith Dark, MD  rOPINIRole (REQUIP) 3 MG tablet TAKE 1 TABLET BY MOUTH AT BEDTIME AS NEEDED 09/23/22   Ardith Dark, MD  tirzepatide [facility]) 2.5 MG/0.5ML Pen Inject 2.5 mg  into the skin once a week. 11/10/22   Ardith Dark, MD  tirzepatide [facility]) 5 MG/0.5ML Pen Inject 5 mg into the skin once a week. 11/10/22   Ardith Dark, MD    Physical Exam: Vitals:   12/07/22 0100 12/07/22 0401 12/07/22 0423 12/07/22 0733  BP: (!) 162/89 (!) 159/70 (!) 159/70 130/60  Pulse: 85 94 90 88  Resp: 13 14 20 14   Temp:   98.6 F (37 C) 98.1 F (36.7 C)  TempSrc:   Oral Oral  SpO2: 100% 95% 100% 99%  Exam  Constitutional: Elderly female currently in NAD, calm, comfortable Eyes: PERRL, lids and conjunctivae normal ENMT: Mucous membranes are dry.  Fair dentition. Neck: normal, supple, no masses, no thyromegaly Respiratory: clear to auscultation bilaterally, no wheezing, no crackles. Normal respiratory effort. No accessory muscle use.  Cardiovascular: Regular rate and rhythm, no murmurs / rubs / gallops. No extremity edema. 2+ pedal pulses.   Abdomen:  Epigastric tender to palpation appreciated..  Bowel sounds positive.  Musculoskeletal: no clubbing / cyanosis. No joint deformity upper and lower extremities. Good ROM, no contractures. Normal muscle tone.  Skin: no rashes, lesions, ulcers. No induration Neurologic: CN 2-12 grossly intact. Sensation intact, DTR normal. Strength 5/5 in all 4.  Psychiatric: Normal judgment and insight. Alert and oriented x 3. Normal mood.   Data Reviewed:  EKG reveals sinus rhythm 86 bpm without any clear ischemic changes.. Reviewed labs, imaging, and pertinent records as noted in this document.  Assessment and Plan:  Mesenteric  thrombus Acute.  Patient presented with complaints of epigastric abdominal pain. CT scan of the abdomen pelvis with contrast was most significant for superior mesenteric vein, main portal vein, proximal left/right portal veins, and proximal splenic vein thrombosis. Based on patient's history it would be assumed patient needs to be on anticoagulation lifelong in the setting of 2 separate thrombotic events. -Admit  to a telemetry bed -Heparin drip per pharmacy.  Determine when medically appropriate to transition to oral anticoagulation of Eliquis -Clear liquid diet and advance as tolerated -Oxycodone/hydromorphone IV for moderate to severe pain respectively -Case had been discussed with Dr. Randie Heinz of vascular surgery for which no surgical intervention at this time, will follow-up for any additional recommendations -Consider outpatient hematology/oncology consult  History of CVA Apical aneurysm with LV thrombus Patient with prior history of an acute right thalamic infarction with old right parieto-occipital stroke on MRI of the brain MRI of the brain 09/2019.  During the hospitalization echocardiogram noted apical aneurysm with small LV thrombus at that time.  She had been treated with Eliquis, but it was discontinued after repeat imaging including TEE showed no residual thrombus.  Patient was currently on aspirin and Plavix. -Continue Plavix as recommended per vascular surgery for atherosclerosis  Elevated liver function studies On admission AST mildly elevated 45 and total bilirubin 1.3.  CT did note hepatic steatosis.  Thought likely secondary to above.  Chronic pain syndrome Secondary to joint and back pain -Continue pain regimen as noted above  Autoimmune disorder Arthritis On chronic immunosuppressive therapy Records note patient had previously been seen by Dr. Corliss Skains of rheumatology back in 2018.  That time patient had a positive ANA with subsequent negative, but had to positive antidouble-stranded DNA on 2 separate checks.  Also had low to medium positive anticardiolipin antibody IgM.   She temporarily had been on Plaquenil.  Patient on chronic prednisone 5 mg daily. -Continue prednisone  Depression -Continue Lexapro  GERD -Change Pepcid to scheduled while on anticoagulation  Obesity BMI 39.32 kg/m.  Patient reports symptoms started after being started on [facility].  Mounjaro known to  cause nausea, vomiting, decreased appetite, and abdominal pain which patient was having.  However, not known to put patients at increased risk for clots which is thought to be the cause for her symptoms.   Will need to complete med reconciliation once verified by pharmacy.  DVT prophylaxis: Heparin Advance Care Planning:   Code Status: Full Code    Consults: Vascular surgery  Family Communication: Daughter updated over the phone  Severity of Illness: The appropriate patient status for this patient is OBSERVATION. Observation status is judged to be reasonable and necessary in order to provide the required intensity of service to ensure the patient's safety. The patient's presenting symptoms, physical exam findings, and initial radiographic and laboratory data in the context of their medical condition is felt to place them at decreased risk for further clinical deterioration. Furthermore,  it is anticipated that the patient will be medically stable for discharge from the hospital within 2 midnights of admission.   Author: Clydie Braun, MD 12/07/2022 7:58 AM  For on call review [email or web address].

## 2022-12-08 DIAGNOSIS — K55069 Acute infarction of intestine, part and extent unspecified: Secondary | ICD-10-CM | POA: Diagnosis not present

## 2022-12-08 LAB — ANTITHROMBIN III: AntiThromb III Func: 59 % — ABNORMAL LOW (ref 75–120)

## 2022-12-08 MED ORDER — EZETIMIBE 10 MG PO TABS
10.0000 mg | ORAL_TABLET | Freq: Every day | ORAL | Status: DC
  Administered 2022-12-08 – 2022-12-11 (×4): 10 mg via ORAL
  Filled 2022-12-08 (×4): qty 1

## 2022-12-08 MED ORDER — ATORVASTATIN CALCIUM 80 MG PO TABS
80.0000 mg | ORAL_TABLET | Freq: Every day | ORAL | Status: DC
  Administered 2022-12-08 – 2022-12-11 (×4): 80 mg via ORAL
  Filled 2022-12-08 (×4): qty 1

## 2022-12-08 NOTE — Progress Notes (Signed)
Pt still has heparin running and had an order for her plavix to be given yesterday. Not sure if provider wanted her to have that dose or wait until heparin is discontinued. Please clarify. Also regular foods brought discomfort to patient and advised to slowly advance diet per md order

## 2022-12-08 NOTE — Plan of Care (Signed)

## 2022-12-08 NOTE — Progress Notes (Signed)
ANTICOAGULATION CONSULT NOTE  Pharmacy Consult for Heparin infusion Indication:  mesenteric, portal, and splenic vein thromboses, h/o CVA  Allergies  Allergen Reactions   Amoxicillin Other (See Comments)    Tolerated Zosyn Has patient had a PCN reaction causing immediate rash, facial/tongue/throat swelling, SOB or lightheadedness with hypotension: No Has patient had a PCN reaction causing severe rash involving mucus membranes or skin necrosis: No Has patient had a PCN reaction that required hospitalization No Has patient had a PCN reaction occurring within the last 10 years: No If all of the above answers are "NO", then may proceed with Cephalosporin use.    Patient Measurements: Height: 5\' 2"  (157.5 cm) Weight: 97.5 kg (215 lb) IBW/kg (Calculated) : 50.1 Heparin Dosing Weight: 73.1 kg  Vital Signs: Temp: 98.5 F (36.9 C) (08/12 1911) Temp Source: Oral (08/12 1911) BP: 117/71 (08/12 1911) Pulse Rate: 88 (08/12 1911)  Labs: Recent Labs    12/07/22 0141 12/07/22 0832 12/07/22 1745 12/08/22 0208  HGB 13.7  --   --  13.1  HCT 42.8  --   --  40.3  PLT 279  --   --  270  APTT  --  32  --   --   LABPROT  --  16.3*  --   --   INR  --  1.3*  --   --   HEPARINUNFRC  --   --  0.47 0.35  CREATININE 0.85  --   --  0.80  TROPONINIHS 10  --   --   --     Estimated Creatinine Clearance: 68.3 mL/min (by C-G formula based on SCr of 0.8 mg/dL).   Medical History: Past Medical History:  Diagnosis Date   Anxiety    Arthritis    Depression    GERD (gastroesophageal reflux disease)    Myocardial infarction (HCC) 89   no visits to cardiac dr(thomas kelly) since 89   Stroke (HCC)    Wound dehiscence    lumbar    Medications:  Medications Prior to Admission  Medication Sig Dispense Refill Last Dose   atorvastatin (LIPITOR) 80 MG tablet Take 1 tablet (80 mg total) by mouth daily. 90 tablet 3 12/05/2022   baclofen (LIORESAL) 10 MG tablet TAKE 1 TABLET BY MOUTH 3 TIMES DAILY 30  tablet 5 12/05/2022   calcium carbonate (TUMS EX) 750 MG chewable tablet Chew 1 tablet by mouth 2 (two) times daily as needed for heartburn.   12/05/2022   clopidogrel (PLAVIX) 75 MG tablet Take 1 tablet (75 mg total) by mouth daily. Please call [phone] to schedule an overdue appointment for future refills. Thank you. 30 tablet 0 12/05/2022   diphenhydrAMINE (BENADRYL) 25 mg capsule Take 25 mg by mouth every 6 (six) hours as needed for sleep or allergies.   12/06/2022   docusate sodium (COLACE) 100 MG capsule Take 100 mg by mouth 2 (two) times daily.   12/05/2022   escitalopram (LEXAPRO) 20 MG tablet Take 1 tablet (20 mg total) by mouth daily. 90 tablet 3 12/05/2022   ezetimibe (ZETIA) 10 MG tablet Take 1 tablet (10 mg total) by mouth daily. 30 tablet 3 12/05/2022   famotidine (PEPCID) 20 MG tablet Take 20 mg by mouth daily as needed for heartburn or indigestion.   Past Week   ondansetron (ZOFRAN) 4 MG tablet Take 1 tablet (4 mg total) by mouth every 8 (eight) hours as needed for nausea or vomiting. 20 tablet 0 Past Week   oxyCODONE-acetaminophen (PERCOCET) 10-325 MG  tablet TAKE 1 TABLET BY MOUTH 5 TIMES DAILY (Patient taking differently: Take 1 tablet by mouth 4 (four) times daily as needed for pain.) 150 tablet 0 Past Week   rOPINIRole (REQUIP) 3 MG tablet TAKE 1 TABLET BY MOUTH AT BEDTIME AS NEEDED (Patient taking differently: Take 3 mg by mouth at bedtime.) 90 tablet 3 12/06/2022   predniSONE (DELTASONE) 5 MG tablet TAKE 1 TABLET BY MOUTH DAILY 90 tablet 3 12/05/2022   tirzepatide (MOUNJARO) 2.5 MG/0.5ML Pen Inject 2.5 mg into the skin once a week. (Patient not taking: Reported on 12/07/2022) 2 mL 0 Not Taking   tirzepatide [facility]) 5 MG/0.5ML Pen Inject 5 mg into the skin once a week. (Patient not taking: Reported on 12/07/2022) 6 mL 0 Not Taking    Assessment: 72 yo F presents with abdominal pain and CT A/P reveals thrombosed SMV and first order tributaries, main portal vein, proximal left and  proximal right portal veins and proximal splenic vein thrombosis with mesenteric edema. Pt has a history of CVA in 06/21 along with previous LV apical thrombus in 06/21. Pt was treated with apixaban for LV thrombus, but apixaban was de-escalated to aspirin and clopidogrel in 08/23 due to resolution of LV thrombus on f/u imaging. Of note, patient had a previously low-medium positive anticoardiolipin antibody IgM when tested in 2018.  Pharmacy has been consulted to dose heparin infusion for treatment of mesenteric, portal, and splenic vein thromboses.   Hgb 13.7, Plt 279  No s/sx of bleeding noted  8/13 PM update:  Heparin level therapeutic x 2  Goal of Therapy:  Heparin level 0.3-0.7 units/ml Monitor platelets by anticoagulation protocol: Yes   Plan:  Cont heparin 1250 units/hr Monitor daily CBC, heparin level, and for s/sx of bleeding F/u plans for any further procedures or interventions and ability to transition to oral [facility]  Abran Duke, PharmD, BCPS Clinical Pharmacist Phone: [phone]

## 2022-12-08 NOTE — Progress Notes (Signed)
PROGRESS NOTE    [name]  UYQ:[id number] DOB: November 16, 1950 DOA: 12/07/2022 PCP: Ardith Dark, MD   Brief Narrative:  [name] Good is a 72 y.o. female with medical history significant of CAD, CVA, apical aneurysm with LV thrombus, prediabetes, arthritis, chronic pain, anxiety, depression, GERD (and questionable diagnosis of Lupus) who presents with complaints of abdominal pain - notes her only new medication is Mounjaro - reports diarrhea which then resolved but subsequently had incredible/intractable anterior abdominal pain any time she ate/drank anything. CT at intake consistent with profound thrombus burden involving the superior mesenteric vein, main portal vein, proximal left/right portal veins, and proximal splenic vein. Hospitalist called for admission, vascular surgery called in consult.   Assessment & Plan:   Principal Problem:   Mesenteric thrombosis (HCC) Active Problems:   History of CVA (cerebrovascular accident)   Elevated liver function tests   Chronic pain syndrome   Autoimmune disease (HCC)   Arthritis   Depression   GERD (gastroesophageal reflux disease)   Obesity (BMI 35.0-39.9 without comorbidity)  Acute symptomatic mesenteric thrombus, large burden - Postprandial epigastric pain improving but not resolved - CT confirms thrombus involving superior mesenteric vein, main portal vein, proximal left/right portal veins, and proximal splenic vein. - Current recommendations are to add heparin drip(ongoing) to current plavix - Plan to convert to, and continue lifelong, PO anticoagulation (DOAC) once acute symptoms from ischemia have resolved - Given previous thrombotic events (LV thrombus, CVA) a hypercoagulable workup to be sent off - previously evaluated by Rheum with questionable diagnosis of Lupus. Patient may benefit from hematology referral prior to/at discharge depending on lab results.  - Advance diet as tolerated - patient reports similar symptoms despite  what she take PO (water vs food = same duration/amount of pain) - Appreciate vascular surgery insight - no current plan/indication for procedure.  History of CVA Apical aneurysm with LV thrombus - Hypercoagulable workup pending as above. - Previously treated with Eliquis but was discontinued after resolution of thrombus. - Continue Plavix as recommended per vascular surgery for atherosclerosis   Elevated liver function studies, resolved -Likely secondary to above, resolved   Chronic pain syndrome Secondary to joint and back pain -Continue pain regimen as noted above   Autoimmune disorder, unspecified Unspecified arthritis On chronic immunosuppressive therapy Records note patient had previously been seen by Dr. 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 G6PDH testing also done in the past WNL ANA positive history Positive anticardiolipin antibody IgM She temporarily had been on methotrexate-->Plaquenil -now discontinued.   Patient only on prednisone 5 mg daily chronically - continue Anti dsDNA repeat test pending here Extractable nuclear antigen test panel pending    - if 4 panel = Anti-RNP, Anti-Sm, Anti-SS-A(Ro), and Anti-SS-B(La)    - if 6 panel = Includes the 4 above as well as: Anti-Scl-70 and Anti-Jo-1   Depression -Continue Lexapro   GERD -Change Pepcid to scheduled while on anticoagulation   Obesity BMI 39.32 kg/m.  Patient reports symptoms started after being started on [facility].  Mounjaro known to cause nausea, vomiting, decreased appetite, and abdominal pain which patient was having.  However, not known to put patients at increased risk for clots which is thought to be the cause for her symptoms.   DVT prophylaxis: Heparin gtt (Likely to transition to Eliquis/similar given prior tolerance) Code Status:   Code Status: Full Code Family Communication: None present  Status is: Inpatient    Dispo:  The  patient is from: Home              Anticipated d/c is to: Home              Anticipated d/c date is: 48-72h              Patient currently NOT medically stable for discharge  Consultants:  Vascular Sx =============================================================== Consider Heme/Onc consult (feasible to wait for hypercoag panel to result/setup outpatient) Sideline discussion with Rheum 8/13 (Deveshwar - who only saw the patient once in 2018 with limited records)  Procedures:  None  Antimicrobials:  None   Subjective: No acute issues/events overnight - abdominal pain ongoing but markedly improved from prior  Objective: Vitals:   12/07/22 1417 12/07/22 1911 12/08/22 0601 12/08/22 0747  BP: 138/86 117/71 126/62 117/62  Pulse: 93 88 84 83  Resp:   17 18  Temp: 97.7 F (36.5 C) 98.5 F (36.9 C) 98.4 F (36.9 C) 98.4 F (36.9 C)  TempSrc: Oral Oral Oral Oral  SpO2: 92% 99% 95% 93%  Weight:      Height:        Intake/Output Summary (Last 24 hours) at 12/08/2022 1647 Last data filed at 12/08/2022 1300 Gross per 24 hour  Intake 1140 ml  Output --  Net 1140 ml   Filed Weights   12/07/22 0831  Weight: 97.5 kg    Examination:  General exam: Appears calm and comfortable  Respiratory system: Clear to auscultation. Respiratory effort normal. Cardiovascular system: RRR Gastrointestinal system: PMI tenderness epigastrum - mild rebound tenderness noted Central nervous system: Alert and oriented. No focal neurological deficits. Extremities: Symmetric 5 x 5 power. Skin: No rashes, lesions or ulcers  Data Reviewed: I have personally reviewed following labs and imaging studies  CBC: Recent Labs  Lab 12/07/22 0141 12/08/22 0208  WBC 10.3 10.7*  NEUTROABS 7.6  --   HGB 13.7 13.1  HCT 42.8 40.3  MCV 94.7 93.5  PLT 279 270   Basic Metabolic Panel: Recent Labs  Lab 12/07/22 0141 12/08/22 0208  NA 136 132*  K 3.7 3.8  CL 99 96*  CO2 23 26  GLUCOSE 115* 102*  BUN 10  9  CREATININE 0.85 0.80  CALCIUM 8.6* 8.4*   GFR: Estimated Creatinine Clearance: 68.3 mL/min (by C-G formula based on SCr of 0.8 mg/dL). Liver Function Tests: Recent Labs  Lab 12/07/22 0141 12/08/22 0208  AST 45* 33  ALT 24 24  ALKPHOS 116 107  BILITOT 1.3* 0.8  PROT 7.0 6.8  ALBUMIN 2.8* 2.7*   Recent Labs  Lab 12/07/22 0141  LIPASE 39   Coagulation Profile: Recent Labs  Lab 12/07/22 0832  INR 1.3*    No results found for this or any previous visit (from the past 240 hour(s)).   Radiology Studies: US Abdomen Limited RUQ (LIVER/GB)  Result Date: 12/07/2022 CLINICAL DATA:  Epigastric pain.  Prior cholecystectomy. EXAM: ULTRASOUND ABDOMEN LIMITED RIGHT UPPER QUADRANT COMPARISON:  Abdomen and pelvis CT 12/07/2022 FINDINGS: Gallbladder: Surgically absent. Common bile duct: Diameter: 4-5 mm Liver: Echogenic liver parenchyma with heterogeneous echotexture diffusely. Portal vein not well visualized consistent with thrombosis reported on CT imaging performed just prior to this exam. Other: None. IMPRESSION: 1. Echogenic liver parenchyma with heterogeneous echotexture. Imaging features suggest hepatic steatosis. 2. Portal vein not well visualized consistent with thrombosis reported on CT imaging performed just prior to this exam. Electronically Signed   By: Kennith Center M.D.   On: 12/07/2022 08:09  CT ABDOMEN PELVIS W CONTRAST  Result Date: 12/07/2022 CLINICAL DATA:  Abdominal pain following Tirzepatide injection on August 1. Occasional diarrhea. EXAM: CT ABDOMEN AND PELVIS WITH CONTRAST TECHNIQUE: Multidetector CT imaging of the abdomen and pelvis was performed using the standard protocol following bolus administration of intravenous contrast. RADIATION DOSE REDUCTION: This exam was performed according to the departmental dose-optimization program which includes automated exposure control, adjustment of the mA and/or kV according to patient size and/or use of iterative  reconstruction technique. CONTRAST:  75mL OMNIPAQUE IOHEXOL 350 MG/ML SOLN COMPARISON:  None Available. FINDINGS: Lower chest: Cardiac size is normal. There is calcification in the circumflex coronary artery. Lung bases are clear of infiltrates. Hepatobiliary: The liver is moderately steatotic. The left lobe is larger than the right which may be seen in various stages of cirrhosis with slightly lobular contour extending over portions of the hepatic undersurface. There are regional perfusion differences which are most likely due to also noted portal vein thrombosis. There is no mass enhancement. The gallbladder is absent, with no biliary dilatation. Pancreas: Partially atrophic. There are scattered punctate parenchymal calcifications that are compatible with chronic calcific pancreatitis. Edema in the mesenteric root fat does extend up to the pancreatic head but this is probably related to SMV thrombosis rather than pancreatitis. Correlation with serum lipase is suggested. There is no mass enhancement or ductal dilatation. Spleen: No splenomegaly or mass. Adrenals/Urinary Tract: No adrenal mass or renal mass enhancement. There is patchy cortical scarring and asymmetric volume loss in the left kidney, small wedge-shaped scar-like defect lower pole right kidney. There is no urinary stone or obstruction. Both of the renal veins and arteries opacify but there are suspected debris atherosclerotic origin stenoses of both renal arteries. There is mild bladder thickening versus underdistention. The bladder is not optimally demonstrated due to metal artifact from a right hip replacement. Stomach/Bowel: Small hiatal hernia. The stomach is contracted. There are small descending duodenal diverticula. The rest of the small bowel is unremarkable. No dilatation or inflammatory change is seen. An appendix is not seen in this patient. There are left colonic diverticula heaviest in the distal descending and sigmoid but no wall  thickening or inflammatory change. Vascular/Lymphatic: There is thrombosis in the SMV and first order tributaries, continuing up into the main portal vein and the proximal left and proximal right portal veins, and into the proximal splenic vein. Flow is seen in the mid to distal splenic vein and its hilar tributaries. Systemic venous opacification is preserved including in the pelvis. There is aortoiliac and visceral branch vessel atherosclerosis. No enlarged lymph nodes are seen. Reproductive: Uterus and bilateral adnexa are unremarkable. Other: There is mesenteric edema primarily in the root fat extending into the lower abdomen and ileocolic mesentery as well. This is almost certainly congestive or due to the SMV thrombosis. Inflammatory component is not strictly excluded. There is minimal pelvic ascites and minimal abdominal ascites in the pericolic gutters but there is no drainable pocket. There is no free air. No bowel pneumatosis. Musculoskeletal: Osteopenia with advanced degenerative change of the thoracic and lumbar spine. Degenerative change left hip with right hip replacement. Acquired spinal stenosis at L4-5 and L3-4 as well as multilevel lumbar acquired foraminal stenosis. There is moderate lower plate wedging of the L2 vertebral body. There is a well corticated bone fragment at the anterior aspect of the L3 body. Findings most likely reflect remote trauma. No acute or other significant osseous findings. Slight levoscoliosis. IMPRESSION: 1. Thrombosed SMV and first order  tributaries, main portal vein, proximal left and proximal right portal veins and proximal splenic vein thrombosis. 2. Mesenteric edema primarily in the root fat extending into the lower abdomen and ileocolic mesentery. This is almost certainly congestive due to the SMV thrombosis. Inflammatory component is not strictly excluded. There is no bowel pneumatosis or free air. 3. Minimal abdominal and pelvic ascites. 4. Moderate hepatic  steatosis with lobular hepatic contour which may be seen in various stages of cirrhosis. No mass enhancement. Slight lobulation of portions of the hepatic undersurface. 5. Chronic calcific pancreatitis. No findings of acute pancreatitis. 6. Aortic and coronary artery atherosclerosis. 7. Left greater than right renal cortical scarring and volume loss. Suspected flow-limiting bilateral renal artery origin stenoses. 8. Cystitis versus bladder nondistention. 9. Osteopenia and degenerative change. 10. Remaining findings described above. 11. These results will be called to the ordering clinician or representative by the Radiologist Assistant, and communication documented in the PACS or [employer]. Aortic Atherosclerosis (ICD10-I70.0). Electronically Signed   By: Almira Bar M.D.   On: 12/07/2022 07:35    Scheduled Meds:  atorvastatin  80 mg Oral Daily   clopidogrel  75 mg Oral Daily   docusate sodium  100 mg Oral BID   escitalopram  20 mg Oral Daily   ezetimibe  10 mg Oral Daily   famotidine  20 mg Oral Daily   predniSONE  5 mg Oral Daily   Continuous Infusions:  heparin 1,250 Units/hr (12/08/22 0005)     LOS: 1 day   Time spent:  Azucena Fallen, DO [facility]  If 7PM-7AM, please contact night-coverage [email or web address]  12/08/2022, 4:47 PM

## 2022-12-09 DIAGNOSIS — K55069 Acute infarction of intestine, part and extent unspecified: Secondary | ICD-10-CM | POA: Diagnosis not present

## 2022-12-09 LAB — HEPARIN LEVEL (UNFRACTIONATED): Heparin Unfractionated: 0.6 [IU]/mL (ref 0.30–0.70)

## 2022-12-09 NOTE — Progress Notes (Signed)
ANTICOAGULATION CONSULT NOTE  Pharmacy Consult for Heparin infusion Indication:  mesenteric, portal, and splenic vein thromboses, h/o CVA  Allergies  Allergen Reactions   Amoxicillin Other (See Comments)    Tolerated Zosyn Has patient had a PCN reaction causing immediate rash, facial/tongue/throat swelling, SOB or lightheadedness with hypotension: No Has patient had a PCN reaction causing severe rash involving mucus membranes or skin necrosis: No Has patient had a PCN reaction that required hospitalization No Has patient had a PCN reaction occurring within the last 10 years: No If all of the above answers are "NO", then may proceed with Cephalosporin use.    Patient Measurements: Height: 5\' 2"  (157.5 cm) Weight: 97.5 kg (215 lb) IBW/kg (Calculated) : 50.1 Heparin Dosing Weight: 73.1 kg  Vital Signs: Temp: 98.3 F (36.8 C) (08/14 0806) BP: 116/53 (08/14 0806) Pulse Rate: 82 (08/14 0806)  Labs: Recent Labs    12/07/22 0141 12/07/22 0832 12/07/22 1745 12/08/22 0208 12/09/22 0656  HGB 13.7  --   --  13.1 12.9  HCT 42.8  --   --  40.3 39.2  PLT 279  --   --  270 278  APTT  --  32  --   --   --   LABPROT  --  16.3*  --   --   --   INR  --  1.3*  --   --   --   HEPARINUNFRC  --   --  0.47 0.35 0.15*  CREATININE 0.85  --   --  0.80 0.88  TROPONINIHS 10  --   --   --   --     Estimated Creatinine Clearance: 62.1 mL/min (by C-G formula based on SCr of 0.88 mg/dL).   Medical History: Past Medical History:  Diagnosis Date   Anxiety    Arthritis    Depression    GERD (gastroesophageal reflux disease)    Myocardial infarction (HCC) 89   no visits to cardiac dr(thomas kelly) since 89   Stroke (HCC)    Wound dehiscence    lumbar    Medications:  Medications Prior to Admission  Medication Sig Dispense Refill Last Dose   atorvastatin (LIPITOR) 80 MG tablet Take 1 tablet (80 mg total) by mouth daily. 90 tablet 3 12/05/2022   baclofen (LIORESAL) 10 MG tablet TAKE 1  TABLET BY MOUTH 3 TIMES DAILY 30 tablet 5 12/05/2022   calcium carbonate (TUMS EX) 750 MG chewable tablet Chew 1 tablet by mouth 2 (two) times daily as needed for heartburn.   12/05/2022   clopidogrel (PLAVIX) 75 MG tablet Take 1 tablet (75 mg total) by mouth daily. Please call [phone] to schedule an overdue appointment for future refills. Thank you. 30 tablet 0 12/05/2022   diphenhydrAMINE (BENADRYL) 25 mg capsule Take 25 mg by mouth every 6 (six) hours as needed for sleep or allergies.   12/06/2022   docusate sodium (COLACE) 100 MG capsule Take 100 mg by mouth 2 (two) times daily.   12/05/2022   escitalopram (LEXAPRO) 20 MG tablet Take 1 tablet (20 mg total) by mouth daily. 90 tablet 3 12/05/2022   ezetimibe (ZETIA) 10 MG tablet Take 1 tablet (10 mg total) by mouth daily. 30 tablet 3 12/05/2022   famotidine (PEPCID) 20 MG tablet Take 20 mg by mouth daily as needed for heartburn or indigestion.   Past Week   ondansetron (ZOFRAN) 4 MG tablet Take 1 tablet (4 mg total) by mouth every 8 (eight) hours as needed for  nausea or vomiting. 20 tablet 0 Past Week   oxyCODONE-acetaminophen (PERCOCET) 10-325 MG tablet TAKE 1 TABLET BY MOUTH 5 TIMES DAILY (Patient taking differently: Take 1 tablet by mouth 4 (four) times daily as needed for pain.) 150 tablet 0 Past Week   rOPINIRole (REQUIP) 3 MG tablet TAKE 1 TABLET BY MOUTH AT BEDTIME AS NEEDED (Patient taking differently: Take 3 mg by mouth at bedtime.) 90 tablet 3 12/06/2022   predniSONE (DELTASONE) 5 MG tablet TAKE 1 TABLET BY MOUTH DAILY 90 tablet 3 12/05/2022   tirzepatide (MOUNJARO) 2.5 MG/0.5ML Pen Inject 2.5 mg into the skin once a week. (Patient not taking: Reported on 12/07/2022) 2 mL 0 Not Taking   tirzepatide [facility]) 5 MG/0.5ML Pen Inject 5 mg into the skin once a week. (Patient not taking: Reported on 12/07/2022) 6 mL 0 Not Taking    Assessment: 72 yo F presents with abdominal pain and CT A/P reveals thrombosed SMV and first order tributaries, main  portal vein, proximal left and proximal right portal veins and proximal splenic vein thrombosis with mesenteric edema. Pt has a history of CVA in 06/21 along with previous LV apical thrombus in 06/21. Pt was treated with apixaban for LV thrombus, but apixaban was de-escalated to aspirin and clopidogrel in 08/23 due to resolution of LV thrombus on f/u imaging. Of note, patient had a previously low-medium positive anticoardiolipin antibody IgM when tested in 2018.  Pharmacy has been consulted to dose heparin infusion for treatment of mesenteric, portal, and splenic vein thromboses.   Heparin level low this AM at 0.15. RN reports issues with IV running overnight with multiple drip pauses to replace IV which could be contributing to low heparin level. No bleeding. Will increase rate but not bolus at this time and recheck heparin level in 8 hours.  Goal of Therapy:  Heparin level 0.3-0.7 units/ml Monitor platelets by anticoagulation protocol: Yes   Plan:  Increase heparin drip to 1400 units/hr Check heparin level in 8 hours Monitor daily CBC, heparin level, and for s/sx of bleeding F/u plans for any further procedures or interventions and ability to transition to oral [facility]  Rexford Maus, PharmD, BCPS 12/09/2022 8:12 AM

## 2022-12-09 NOTE — Plan of Care (Signed)

## 2022-12-09 NOTE — Progress Notes (Addendum)
[facility] PROGRESS NOTE    Progress Note  [name] Good  GBE:[id number] DOB: 12-Sep-1950 DOA: 12/07/2022 PCP: Ardith Dark, MD     Brief Narrative:   [name] Good is an 72 y.o. 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 Plan is to convert to lifelong DOAC and outpatient. Hypercoagulable workup was sent off, she will need to be followed up by hematology and rheumatology as an outpatient for questionable diagnosis of lupus and hypercoagulable state. Diet advanced. Continue narcotics for pain control. Vascular to dictate when to change to oral anticoagulation. Still having abdominal pain she was helping  History of CVA/medical aneurysm with thrombus, Hypercoagulable panel pending as above. Previously treated with Eliquis, discontinue after resolution of thrombus. Continue Plavix  Elevated LFTs: Secondary to above.  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 She was previously on methotrexate and Plaquenil. Continue prednisone daily. Follow-up with rheumatology as an outpatient.  Depression: Continue Lexapro  GERD: Pepcid.   DVT prophylaxis: heparin Family Communication:none Status is: Inpatient Remains inpatient appropriate because: Acute mesenteric  thrombosis    Code Status:     Code Status Orders  (From admission, onward)           Start     Ordered   12/07/22 0823  Full code  Continuous       Question:  By:  Answer:  Consent: discussion documented in EHR   12/07/22 0826           Code Status History     Date Active Date Inactive Code Status Order ID Comments User Context   03/08/2020 2055 03/13/2020 2120 Full Code [id number]  Hillary Bow, DO ED   10/05/2019 1517 10/21/2019 1521 Full Code [id number]  Charlton Amor, PA-C Inpatient   10/05/2019 1517 10/05/2019 1517 Full Code [id number]  Charlton Amor, PA-C Inpatient   10/01/2019 2330 10/05/2019 1442 Full Code [id number]  John Giovanni, MD ED   10/16/2017 0920 10/20/2017 0025 Full Code [id number]  Lupita Leash, MD Inpatient   10/25/2015 1543 10/28/2015 1535 Full Code [id number]  Kathryne Hitch, MD Inpatient         IV Access:   Peripheral IV   Procedures and diagnostic studies:   No results found.   Medical Consultants:   None.   Subjective:    [name] Good she is complaining of her abdomen is bothering her hypertension.  Objective:    Vitals:   12/08/22 0601 12/08/22 0747 12/08/22 1932 12/09/22 0341  BP: 126/62 117/62 129/72 121/60  Pulse: 84 83 92 94  Resp: 17 18 17 18   Temp: 98.4 F (36.9 C) 98.4 F (36.9 C) 98 F (36.7 C) 98 F (36.7 C)  TempSrc: Oral Oral    SpO2: 95% 93% 97% 95%  Weight:      Height:       SpO2: 95 %   Intake/Output Summary (  Last 24 hours) at 12/09/2022 0744 Last data filed at 12/09/2022 0451 Gross per 24 hour  Intake 1517.43 ml  Output --  Net 1517.43 ml   Filed Weights   12/07/22 0831  Weight: 97.5 kg    Exam: General exam: In no acute distress. Respiratory system: Good air movement and clear to auscultation. Cardiovascular system: S1 & S2 heard, RRR. No JVD. Gastrointestinal system: Abdomen is nondistended, soft and nontender.  Extremities: No pedal edema. Skin: No rashes,  lesions or ulcers Psychiatry: Judgement and insight appear normal. Mood & affect appropriate.    Data Reviewed:    Labs: Basic Metabolic Panel: Recent Labs  Lab 12/07/22 0141 12/08/22 0208  NA 136 132*  K 3.7 3.8  CL 99 96*  CO2 23 26  GLUCOSE 115* 102*  BUN 10 9  CREATININE 0.85 0.80  CALCIUM 8.6* 8.4*   GFR Estimated Creatinine Clearance: 68.3 mL/min (by C-G formula based on SCr of 0.8 mg/dL). Liver Function Tests: Recent Labs  Lab 12/07/22 0141 12/08/22 0208  AST 45* 33  ALT 24 24  ALKPHOS 116 107  BILITOT 1.3* 0.8  PROT 7.0 6.8  ALBUMIN 2.8* 2.7*   Recent Labs  Lab 12/07/22 0141  LIPASE 39   No results for input(s): "AMMONIA" in the last 168 hours. Coagulation profile Recent Labs  Lab 12/07/22 0832  INR 1.3*   COVID-19 Labs  No results for input(s): "DDIMER", "FERRITIN", "LDH", "CRP" in the last 72 hours.  Lab Results  Component Value Date   SARSCOV2NAA NEGATIVE 03/08/2020    CBC: Recent Labs  Lab 12/07/22 0141 12/08/22 0208 12/09/22 0656  WBC 10.3 10.7* 9.2  NEUTROABS 7.6  --   --   HGB 13.7 13.1 12.9  HCT 42.8 40.3 39.2  MCV 94.7 93.5 93.6  PLT 279 270 278   Cardiac Enzymes: No results for input(s): "CKTOTAL", "CKMB", "CKMBINDEX", "TROPONINI" in the last 168 hours. BNP (last 3 results) No results for input(s): "PROBNP" in the last 8760 hours. CBG: No results for input(s): "GLUCAP" in the last 168 hours. D-Dimer: No results for input(s): "DDIMER" in the last 72 hours. Hgb A1c: No results for input(s): "HGBA1C" in the last 72 hours. Lipid Profile: No results for input(s): "CHOL", "HDL", "LDLCALC", "TRIG", "CHOLHDL", "LDLDIRECT" in the last 72 hours. Thyroid function studies: No results for input(s): "TSH", "T4TOTAL", "T3FREE", "THYROIDAB" in the last 72 hours.  Invalid input(s): "FREET3" Anemia work up: No results for input(s): "VITAMINB12", "FOLATE", "FERRITIN", "TIBC", "IRON", "RETICCTPCT" in the last 72 hours. Sepsis  Labs: Recent Labs  Lab 12/07/22 0141 12/08/22 0208 12/09/22 0656  WBC 10.3 10.7* 9.2   Microbiology No results found for this or any previous visit (from the past 240 hour(s)).   Medications:    atorvastatin  80 mg Oral Daily   clopidogrel  75 mg Oral Daily   docusate sodium  100 mg Oral BID   escitalopram  20 mg Oral Daily   ezetimibe  10 mg Oral Daily   famotidine  20 mg Oral Daily   predniSONE  5 mg Oral Daily   Continuous Infusions:  heparin 1,250 Units/hr (12/09/22 0522)      LOS: 2 days   Marinda Elk  [facility]  12/09/2022, 7:44 AM

## 2022-12-09 NOTE — Plan of Care (Signed)

## 2022-12-09 NOTE — Progress Notes (Deleted)
ANTICOAGULATION CONSULT NOTE  Pharmacy Consult for Heparin infusion Indication:  mesenteric, portal, and splenic vein thromboses, h/o CVA  Allergies  Allergen Reactions   Amoxicillin Other (See Comments)    Tolerated Zosyn Has patient had a PCN reaction causing immediate rash, facial/tongue/throat swelling, SOB or lightheadedness with hypotension: No Has patient had a PCN reaction causing severe rash involving mucus membranes or skin necrosis: No Has patient had a PCN reaction that required hospitalization No Has patient had a PCN reaction occurring within the last 10 years: No If all of the above answers are "NO", then may proceed with Cephalosporin use.    Patient Measurements: Height: 5\' 2"  (157.5 cm) Weight: 97.5 kg (215 lb) IBW/kg (Calculated) : 50.1 Heparin Dosing Weight: 73.1 kg  Vital Signs: Temp: 98.4 F (36.9 C) (08/14 2004) Temp Source: Oral (08/14 2004) BP: 108/55 (08/14 2004) Pulse Rate: 88 (08/14 2004)  Labs: Recent Labs    12/07/22 0141 12/07/22 0981 12/07/22 1745 12/08/22 0208 12/09/22 0656 12/09/22 2104  HGB 13.7  --   --  13.1 12.9  --   HCT 42.8  --   --  40.3 39.2  --   PLT 279  --   --  270 278  --   APTT  --  32  --   --   --   --   LABPROT  --  16.3*  --   --   --   --   INR  --  1.3*  --   --   --   --   HEPARINUNFRC  --   --    < > 0.35 0.15* 0.60  CREATININE 0.85  --   --  0.80 0.88  --   TROPONINIHS 10  --   --   --   --   --    < > = values in this interval not displayed.    Estimated Creatinine Clearance: 62.1 mL/min (by C-G formula based on SCr of 0.88 mg/dL).   Medical History: Past Medical History:  Diagnosis Date   Anxiety    Arthritis    Depression    GERD (gastroesophageal reflux disease)    Myocardial infarction (HCC) 91   no visits to cardiac dr(thomas kelly) since 92   Stroke (HCC)    Wound dehiscence    lumbar    Medications:  Medications Prior to Admission  Medication Sig Dispense Refill Last Dose    atorvastatin (LIPITOR) 80 MG tablet Take 1 tablet (80 mg total) by mouth daily. 90 tablet 3 12/05/2022   baclofen (LIORESAL) 10 MG tablet TAKE 1 TABLET BY MOUTH 3 TIMES DAILY 30 tablet 5 12/05/2022   calcium carbonate (TUMS EX) 750 MG chewable tablet Chew 1 tablet by mouth 2 (two) times daily as needed for heartburn.   12/05/2022   clopidogrel (PLAVIX) 75 MG tablet Take 1 tablet (75 mg total) by mouth daily. Please call [phone] to schedule an overdue appointment for future refills. Thank you. 30 tablet 0 12/05/2022   diphenhydrAMINE (BENADRYL) 25 mg capsule Take 25 mg by mouth every 6 (six) hours as needed for sleep or allergies.   12/06/2022   docusate sodium (COLACE) 100 MG capsule Take 100 mg by mouth 2 (two) times daily.   12/05/2022   escitalopram (LEXAPRO) 20 MG tablet Take 1 tablet (20 mg total) by mouth daily. 90 tablet 3 12/05/2022   ezetimibe (ZETIA) 10 MG tablet Take 1 tablet (10 mg total) by mouth daily. 30 tablet 3  12/05/2022   famotidine (PEPCID) 20 MG tablet Take 20 mg by mouth daily as needed for heartburn or indigestion.   Past Week   ondansetron (ZOFRAN) 4 MG tablet Take 1 tablet (4 mg total) by mouth every 8 (eight) hours as needed for nausea or vomiting. 20 tablet 0 Past Week   oxyCODONE-acetaminophen (PERCOCET) 10-325 MG tablet TAKE 1 TABLET BY MOUTH 5 TIMES DAILY (Patient taking differently: Take 1 tablet by mouth 4 (four) times daily as needed for pain.) 150 tablet 0 Past Week   rOPINIRole (REQUIP) 3 MG tablet TAKE 1 TABLET BY MOUTH AT BEDTIME AS NEEDED (Patient taking differently: Take 3 mg by mouth at bedtime.) 90 tablet 3 12/06/2022   predniSONE (DELTASONE) 5 MG tablet TAKE 1 TABLET BY MOUTH DAILY 90 tablet 3 12/05/2022   tirzepatide (MOUNJARO) 2.5 MG/0.5ML Pen Inject 2.5 mg into the skin once a week. (Patient not taking: Reported on 12/07/2022) 2 mL 0 Not Taking   tirzepatide [facility]) 5 MG/0.5ML Pen Inject 5 mg into the skin once a week. (Patient not taking: Reported on  12/07/2022) 6 mL 0 Not Taking    Assessment: 72 yo F presents with abdominal pain and CT A/P reveals thrombosed SMV and first order tributaries, main portal vein, proximal left and proximal right portal veins and proximal splenic vein thrombosis with mesenteric edema. Pt has a history of CVA in 06/21 along with previous LV apical thrombus in 06/21. Pt was treated with apixaban for LV thrombus, but apixaban was de-escalated to aspirin and clopidogrel in 08/23 due to resolution of LV thrombus on f/u imaging. Of note, patient had a previously low-medium positive anticoardiolipin antibody IgM when tested in 2018.  Pharmacy has been consulted to dose heparin infusion for treatment of mesenteric, portal, and splenic vein thromboses.   Heparin level low this AM at 0.15. RN reports issues with IV running overnight with multiple drip pauses to replace IV which could be contributing to low heparin level. No bleeding. Will increase rate but not bolus at this time and recheck heparin level in 8 hours.  HL 0.60 - therapeutic  Of note HL drawn late, due to patient is hard stick. Three attempts from phlebotomy - appreciate assistance.   Goal of Therapy:  Heparin level 0.3-0.7 units/ml Monitor platelets by anticoagulation protocol: Yes   Plan:  Continue heparin drip 1400 units/hr Monitor daily CBC, heparin level, and for s/sx of bleeding F/u plans for any further procedures or interventions and ability to transition to oral [facility]  Calton Dach, PharmD Clinical Pharmacist 12/09/2022 9:47 PM

## 2022-12-09 NOTE — Progress Notes (Signed)
ANTICOAGULATION CONSULT NOTE  Pharmacy Consult for Heparin infusion Indication:  mesenteric, portal, and splenic vein thromboses, h/o CVA  Allergies  Allergen Reactions   Amoxicillin Other (See Comments)    Tolerated Zosyn Has patient had a PCN reaction causing immediate rash, facial/tongue/throat swelling, SOB or lightheadedness with hypotension: No Has patient had a PCN reaction causing severe rash involving mucus membranes or skin necrosis: No Has patient had a PCN reaction that required hospitalization No Has patient had a PCN reaction occurring within the last 10 years: No If all of the above answers are "NO", then may proceed with Cephalosporin use.    Patient Measurements: Height: 5\' 2"  (157.5 cm) Weight: 97.5 kg (215 lb) IBW/kg (Calculated) : 50.1 Heparin Dosing Weight: 73.1 kg  Vital Signs: Temp: 98.4 F (36.9 C) (08/14 2004) Temp Source: Oral (08/14 2004) BP: 108/55 (08/14 2004) Pulse Rate: 88 (08/14 2004)  Labs: Recent Labs    12/07/22 0141 12/07/22 8295 12/07/22 1745 12/08/22 0208 12/09/22 0656 12/09/22 2104  HGB 13.7  --   --  13.1 12.9  --   HCT 42.8  --   --  40.3 39.2  --   PLT 279  --   --  270 278  --   APTT  --  32  --   --   --   --   LABPROT  --  16.3*  --   --   --   --   INR  --  1.3*  --   --   --   --   HEPARINUNFRC  --   --    < > 0.35 0.15* 0.60  CREATININE 0.85  --   --  0.80 0.88  --   TROPONINIHS 10  --   --   --   --   --    < > = values in this interval not displayed.    Estimated Creatinine Clearance: 62.1 mL/min (by C-G formula based on SCr of 0.88 mg/dL).   Medical History: Past Medical History:  Diagnosis Date   Anxiety    Arthritis    Depression    GERD (gastroesophageal reflux disease)    Myocardial infarction (HCC) 91   no visits to cardiac dr(thomas kelly) since 92   Stroke (HCC)    Wound dehiscence    lumbar    Medications:  Medications Prior to Admission  Medication Sig Dispense Refill Last Dose    atorvastatin (LIPITOR) 80 MG tablet Take 1 tablet (80 mg total) by mouth daily. 90 tablet 3 12/05/2022   baclofen (LIORESAL) 10 MG tablet TAKE 1 TABLET BY MOUTH 3 TIMES DAILY 30 tablet 5 12/05/2022   calcium carbonate (TUMS EX) 750 MG chewable tablet Chew 1 tablet by mouth 2 (two) times daily as needed for heartburn.   12/05/2022   clopidogrel (PLAVIX) 75 MG tablet Take 1 tablet (75 mg total) by mouth daily. Please call [phone] to schedule an overdue appointment for future refills. Thank you. 30 tablet 0 12/05/2022   diphenhydrAMINE (BENADRYL) 25 mg capsule Take 25 mg by mouth every 6 (six) hours as needed for sleep or allergies.   12/06/2022   docusate sodium (COLACE) 100 MG capsule Take 100 mg by mouth 2 (two) times daily.   12/05/2022   escitalopram (LEXAPRO) 20 MG tablet Take 1 tablet (20 mg total) by mouth daily. 90 tablet 3 12/05/2022   ezetimibe (ZETIA) 10 MG tablet Take 1 tablet (10 mg total) by mouth daily. 30 tablet 3  12/05/2022   famotidine (PEPCID) 20 MG tablet Take 20 mg by mouth daily as needed for heartburn or indigestion.   Past Week   ondansetron (ZOFRAN) 4 MG tablet Take 1 tablet (4 mg total) by mouth every 8 (eight) hours as needed for nausea or vomiting. 20 tablet 0 Past Week   oxyCODONE-acetaminophen (PERCOCET) 10-325 MG tablet TAKE 1 TABLET BY MOUTH 5 TIMES DAILY (Patient taking differently: Take 1 tablet by mouth 4 (four) times daily as needed for pain.) 150 tablet 0 Past Week   rOPINIRole (REQUIP) 3 MG tablet TAKE 1 TABLET BY MOUTH AT BEDTIME AS NEEDED (Patient taking differently: Take 3 mg by mouth at bedtime.) 90 tablet 3 12/06/2022   predniSONE (DELTASONE) 5 MG tablet TAKE 1 TABLET BY MOUTH DAILY 90 tablet 3 12/05/2022   tirzepatide (MOUNJARO) 2.5 MG/0.5ML Pen Inject 2.5 mg into the skin once a week. (Patient not taking: Reported on 12/07/2022) 2 mL 0 Not Taking   tirzepatide [facility]) 5 MG/0.5ML Pen Inject 5 mg into the skin once a week. (Patient not taking: Reported on  12/07/2022) 6 mL 0 Not Taking    Assessment: 72 yo F presents with abdominal pain and CT A/P reveals thrombosed SMV and first order tributaries, main portal vein, proximal left and proximal right portal veins and proximal splenic vein thrombosis with mesenteric edema. Pt has a history of CVA in 06/21 along with previous LV apical thrombus in 06/21. Pt was treated with apixaban for LV thrombus, but apixaban was de-escalated to aspirin and clopidogrel in 08/23 due to resolution of LV thrombus on f/u imaging. Of note, patient had a previously low-medium positive anticoardiolipin antibody IgM when tested in 2018.  Pharmacy has been consulted to dose heparin infusion for treatment of mesenteric, portal, and splenic vein thromboses.   Heparin level low this AM at 0.15. RN reports issues with IV running overnight with multiple drip pauses to replace IV which could be contributing to low heparin level. No bleeding. Will increase rate but not bolus at this time and recheck heparin level in 8 hours.  8/14 PM update: HL 0.60- therapeutic No signs of bleeding or issues with heparin gtt  Goal of Therapy:  Heparin level 0.3-0.7 units/ml Monitor platelets by anticoagulation protocol: Yes   Plan:  Continue heparin drip to 1400 units/hr Monitor daily CBC, heparin level, and for s/sx of bleeding F/u plans for any further procedures or interventions and ability to transition to oral [facility]    BS, PharmD, BCPS Clinical Pharmacist 12/09/2022 9:48 PM  Contact: [phone] after 3 PM  "Be curious, not judgmental..." -Debbora Dus

## 2022-12-10 ENCOUNTER — Other Ambulatory Visit (HOSPITAL_COMMUNITY): Payer: Self-pay

## 2022-12-10 DIAGNOSIS — K55069 Acute infarction of intestine, part and extent unspecified: Secondary | ICD-10-CM | POA: Diagnosis not present

## 2022-12-10 LAB — CBC
HCT: 38.7 % (ref 36.0–46.0)
Hemoglobin: 12.6 g/dL (ref 12.0–15.0)
MCH: 30.6 pg (ref 26.0–34.0)
MCHC: 32.6 g/dL (ref 30.0–36.0)
MCV: 93.9 fL (ref 80.0–100.0)
Platelets: 259 10*3/uL (ref 150–400)
RBC: 4.12 MIL/uL (ref 3.87–5.11)
RDW: 12.8 % (ref 11.5–15.5)
WBC: 6.9 10*3/uL (ref 4.0–10.5)
nRBC: 0 % (ref 0.0–0.2)

## 2022-12-10 LAB — COMPREHENSIVE METABOLIC PANEL
ALT: 25 U/L (ref 0–44)
AST: 49 U/L — ABNORMAL HIGH (ref 15–41)
Albumin: 2.5 g/dL — ABNORMAL LOW (ref 3.5–5.0)
Alkaline Phosphatase: 104 U/L (ref 38–126)
Anion gap: 11 (ref 5–15)
BUN: 12 mg/dL (ref 8–23)
CO2: 22 mmol/L (ref 22–32)
Calcium: 8.3 mg/dL — ABNORMAL LOW (ref 8.9–10.3)
Chloride: 100 mmol/L (ref 98–111)
Creatinine, Ser: 0.83 mg/dL (ref 0.44–1.00)
GFR, Estimated: >60 mL/min (ref >60)
Glucose, Bld: 92 mg/dL (ref 70–99)
Potassium: 3.3 mmol/L — ABNORMAL LOW (ref 3.5–5.1)
Sodium: 133 mmol/L — ABNORMAL LOW (ref 135–145)
Total Bilirubin: 0.4 mg/dL (ref 0.3–1.2)
Total Protein: 6.5 g/dL (ref 6.5–8.1)

## 2022-12-10 LAB — HEPARIN LEVEL (UNFRACTIONATED): Heparin Unfractionated: 0.55 [IU]/mL (ref 0.30–0.70)

## 2022-12-10 MED ORDER — APIXABAN 5 MG PO TABS: 5.0000 mg | ORAL_TABLET | Freq: Two times a day (BID) | ORAL | Status: DC

## 2022-12-10 MED ORDER — APIXABAN 5 MG PO TABS
10.0000 mg | ORAL_TABLET | Freq: Two times a day (BID) | ORAL | Status: DC
  Administered 2022-12-10 – 2022-12-11 (×3): 10 mg via ORAL
  Filled 2022-12-10 (×3): qty 2

## 2022-12-10 NOTE — Progress Notes (Signed)
Mobility Specialist: Progress Note   12/10/22 1511  Mobility  Activity Ambulated with assistance in hallway  Level of Assistance Contact guard assist, steadying assist  Assistive Device Front wheel walker  Distance Ambulated (ft) 100 ft  Activity Response Tolerated well  Mobility Referral Yes  $Mobility charge 1 Mobility  Mobility Specialist Start Time (ACUTE ONLY) 1457  Mobility Specialist Stop Time (ACUTE ONLY) 1508  Mobility Specialist Time Calculation (min) (ACUTE ONLY) 11 min   During Mobility: HR 113-119 Post-Mobility: HR 107  Pt was agreeable to mobility session - received in bed. Had c/o weakness and SOB. Denies any dizziness or pain. Pt walked with a limp but stated that's baseline. Left on EOB with all needs met, call bell in reach.   Maurene Capes Mobility Specialist Please contact via SecureChat or Rehab office at [phone]

## 2022-12-10 NOTE — Care Management Important Message (Signed)
Important Message  Patient Details  Name: [name] Good MRN: [id number] Date of Birth: April 05, 1951   Medicare Important Message Given:  Yes     Sherilyn Banker 12/10/2022, 12:55 PM

## 2022-12-10 NOTE — Progress Notes (Signed)
PROGRESS NOTE    [name] Good  ZOX:[id number] DOB: 06-13-1950 DOA: 12/07/2022 PCP: Ardith Dark, MD   Brief Narrative:  [name] Good is a 72 y.o. female with medical history significant of CAD, CVA, apical aneurysm with LV thrombus, prediabetes, arthritis, chronic pain, anxiety, depression, GERD (and questionable diagnosis of Lupus) who presents with complaints of abdominal pain - notes her only new medication is Mounjaro - reports diarrhea which then resolved but subsequently had incredible/intractable anterior abdominal pain any time she ate/drank anything. CT at intake consistent with profound thrombus burden involving the superior mesenteric vein, main portal vein, proximal left/right portal veins, and proximal splenic vein. Hospitalist called for admission, vascular surgery called in consult.   Assessment & Plan:   Principal Problem:   Superior mesenteric vein thrombosis (HCC) Active Problems:   History of CVA (cerebrovascular accident)   Elevated liver function tests   Chronic pain syndrome   Autoimmune disease (HCC)   Arthritis   Depression   GERD (gastroesophageal reflux disease)   Obesity (BMI 35.0-39.9 without comorbidity)  Acute symptomatic mesenteric thrombus, large burden - CT confirms thrombus involving superior mesenteric vein, main portal vein, proximal left/right portal veins, and proximal splenic vein. - Transition to Eliquis - stop heparin; continue home plavix - Anticoagulation will be lifelong given recurrent thrombus without clear cause/etiology -Hypercoagulable workup pending - previously evaluated by Rheum with questionable diagnosis of Lupus (repeat dsDNA positive here) -Follow up outpatient with heme-onc -Pain remains uncontrolled - requiring IV dilaudid again today despite PO medications after lunch. - Appreciate vascular surgery insight - no current plan/indication for procedure.  History of CVA Apical aneurysm with LV thrombus Questionable  hypercoagulable state - Hypercoagulable workup labs sent off - dsDNA elevated(26 - previously 15 - normal 0-9) - remainder of nuclear Ab panel neg - AntiJo negative - Previously treated with Eliquis but was discontinued after resolution of thrombus. - Continue Plavix as recommended per vascular surgery for atherosclerosis - Plan to transition back to DOAC at discharge   Elevated liver function studies, resolved -Likely secondary to above, resolved   Chronic pain syndrome Secondary to joint and back pain -Continue pain regimen as noted above   Autoimmune disorder, unspecified Rule out Lupus Unspecified arthritis On chronic immunosuppressive therapy Records note patient had previously been seen by Dr. 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 G6PDH testing also done in the past WNL ANA positive history Positive anticardiolipin antibody IgM She temporarily had been on methotrexate-->Plaquenil -now discontinued.   Patient only on prednisone 5 mg daily chronically - continue Anti dsDNA repeat test positive as above Extractable nuclear antigen test panel pending    - if 4 panel = Anti-RNP, Anti-Sm, Anti-SS-A(Ro), and Anti-SS-B(La)    - if 6 panel = Includes the 4 above as well as: Anti-Scl-70 and Anti-Jo-1   Depression -Continue Lexapro   GERD -Change Pepcid to scheduled while on anticoagulation   Obesity BMI 39.32 kg/m.  Patient reports symptoms started after being started on [facility].  Mounjaro known to cause nausea, vomiting, decreased appetite, and abdominal pain which patient was having.  However, not known to put patients at increased risk for clots which is thought to be the cause for her symptoms.   DVT prophylaxis:  apixaban (ELIQUIS) tablet 10 mg  apixaban (ELIQUIS) tablet 5 mg    Code Status: Full Code  Family Communication: None present  Status is: Inpatient    Dispo: The patient is  from: Home               Anticipated d/c is to: Home              Anticipated d/c date is: 24h pending clinical course              Patient currently NOT medically stable for discharge given ongoing need for IV pain control  Consultants:  Vascular Sx Sideline discussion with Rheum 8/13 (Deveshwar - who only saw the patient once in 2018 with limited records) - recommending outpatient heme-onc follow up.  Procedures:  None  Antimicrobials:  None   Subjective: No acute issues/events overnight - abdominal pain markedly uncontrolled today after lunch requiring IV dilaudid.  Objective: Vitals:   12/09/22 0806 12/09/22 1435 12/09/22 2004 12/10/22 0449  BP: (!) 116/53 (!) 111/56 (!) 108/55 114/60  Pulse: 82 97 88 97  Resp: 18 16 16 16   Temp: 98.3 F (36.8 C) 98 F (36.7 C) 98.4 F (36.9 C)   TempSrc:  Oral Oral   SpO2: 99% 98% 94% 96%  Weight:      Height:        Intake/Output Summary (Last 24 hours) at 12/10/2022 0624 Last data filed at 12/09/2022 1525 Gross per 24 hour  Intake 141.26 ml  Output --  Net 141.26 ml   Filed Weights   12/07/22 0831  Weight: 97.5 kg    Examination:  General exam: Appears calm and comfortable  Respiratory system: Clear to auscultation. Respiratory effort normal. Cardiovascular system: RRR Gastrointestinal system: PMI tenderness epigastrum Central nervous system: Alert and oriented. No focal neurological deficits. Extremities: Symmetric 5 x 5 power. Skin: No rashes, lesions or ulcers  Data Reviewed: I have personally reviewed following labs and imaging studies  CBC: Recent Labs  Lab 12/07/22 0141 12/08/22 0208 12/09/22 0656  WBC 10.3 10.7* 9.2  NEUTROABS 7.6  --   --   HGB 13.7 13.1 12.9  HCT 42.8 40.3 39.2  MCV 94.7 93.5 93.6  PLT 279 270 278   Basic Metabolic Panel: Recent Labs  Lab 12/07/22 0141 12/08/22 0208 12/09/22 0656  NA 136 132* 133*  K 3.7 3.8 3.7  CL 99 96* 98  CO2 23 26 26   GLUCOSE 115* 102* 111*  BUN 10 9 11    CREATININE 0.85 0.80 0.88  CALCIUM 8.6* 8.4* 8.3*   GFR: Estimated Creatinine Clearance: 62.1 mL/min (by C-G formula based on SCr of 0.88 mg/dL). Liver Function Tests: Recent Labs  Lab 12/07/22 0141 12/08/22 0208 12/09/22 0656  AST 45* 33 37  ALT 24 24 23   ALKPHOS 116 107 93  BILITOT 1.3* 0.8 0.9  PROT 7.0 6.8 6.5  ALBUMIN 2.8* 2.7* 2.6*   Recent Labs  Lab 12/07/22 0141  LIPASE 39   Coagulation Profile: Recent Labs  Lab 12/07/22 0832  INR 1.3*    No results found for this or any previous visit (from the past 240 hour(s)).   Radiology Studies: No results found.  Scheduled Meds:  atorvastatin  80 mg Oral Daily   clopidogrel  75 mg Oral Daily   docusate sodium  100 mg Oral BID   escitalopram  20 mg Oral Daily   ezetimibe  10 mg Oral Daily   famotidine  20 mg Oral Daily   predniSONE  5 mg Oral Daily   Continuous Infusions:  heparin 1,400 Units/hr (12/09/22 1709)     LOS: 3 days   Time spent:  Azucena Fallen, DO [facility]  If 7PM-7AM, please contact night-coverage [email or web address]  12/10/2022, 6:24 AM

## 2022-12-10 NOTE — Plan of Care (Signed)

## 2022-12-10 NOTE — Progress Notes (Signed)
ANTICOAGULATION CONSULT NOTE  Pharmacy Consult for Heparin infusion Indication:  mesenteric, portal, and splenic vein thromboses, h/o CVA  Allergies  Allergen Reactions   Amoxicillin Other (See Comments)    Tolerated Zosyn Has patient had a PCN reaction causing immediate rash, facial/tongue/throat swelling, SOB or lightheadedness with hypotension: No Has patient had a PCN reaction causing severe rash involving mucus membranes or skin necrosis: No Has patient had a PCN reaction that required hospitalization No Has patient had a PCN reaction occurring within the last 10 years: No If all of the above answers are "NO", then may proceed with Cephalosporin use.    Patient Measurements: Height: 5\' 2"  (157.5 cm) Weight: 97.5 kg (215 lb) IBW/kg (Calculated) : 50.1 Heparin Dosing Weight: 73.1 kg  Vital Signs: Temp: 98.2 F (36.8 C) (08/15 0740) BP: 108/48 (08/15 0740) Pulse Rate: 89 (08/15 0740)  Labs: Recent Labs    12/07/22 0832 12/07/22 1745 12/08/22 0208 12/09/22 0656 12/09/22 2104 12/10/22 0647  HGB  --    < > 13.1 12.9  --  12.6  HCT  --   --  40.3 39.2  --  38.7  PLT  --   --  270 278  --  259  APTT 32  --   --   --   --   --   LABPROT 16.3*  --   --   --   --   --   INR 1.3*  --   --   --   --   --   HEPARINUNFRC  --    < > 0.35 0.15* 0.60 0.55  CREATININE  --   --  0.80 0.88  --  0.83   < > = values in this interval not displayed.    Estimated Creatinine Clearance: 65.9 mL/min (by C-G formula based on SCr of 0.83 mg/dL).   Medical History: Past Medical History:  Diagnosis Date   Anxiety    Arthritis    Depression    GERD (gastroesophageal reflux disease)    Myocardial infarction (HCC) 89   no visits to cardiac dr(thomas kelly) since 89   Stroke (HCC)    Wound dehiscence    lumbar    Medications:  Medications Prior to Admission  Medication Sig Dispense Refill Last Dose   atorvastatin (LIPITOR) 80 MG tablet Take 1 tablet (80 mg total) by mouth daily.  90 tablet 3 12/05/2022   baclofen (LIORESAL) 10 MG tablet TAKE 1 TABLET BY MOUTH 3 TIMES DAILY 30 tablet 5 12/05/2022   calcium carbonate (TUMS EX) 750 MG chewable tablet Chew 1 tablet by mouth 2 (two) times daily as needed for heartburn.   12/05/2022   clopidogrel (PLAVIX) 75 MG tablet Take 1 tablet (75 mg total) by mouth daily. Please call [phone] to schedule an overdue appointment for future refills. Thank you. 30 tablet 0 12/05/2022   diphenhydrAMINE (BENADRYL) 25 mg capsule Take 25 mg by mouth every 6 (six) hours as needed for sleep or allergies.   12/06/2022   docusate sodium (COLACE) 100 MG capsule Take 100 mg by mouth 2 (two) times daily.   12/05/2022   escitalopram (LEXAPRO) 20 MG tablet Take 1 tablet (20 mg total) by mouth daily. 90 tablet 3 12/05/2022   ezetimibe (ZETIA) 10 MG tablet Take 1 tablet (10 mg total) by mouth daily. 30 tablet 3 12/05/2022   famotidine (PEPCID) 20 MG tablet Take 20 mg by mouth daily as needed for heartburn or indigestion.   Past Week  ondansetron (ZOFRAN) 4 MG tablet Take 1 tablet (4 mg total) by mouth every 8 (eight) hours as needed for nausea or vomiting. 20 tablet 0 Past Week   oxyCODONE-acetaminophen (PERCOCET) 10-325 MG tablet TAKE 1 TABLET BY MOUTH 5 TIMES DAILY (Patient taking differently: Take 1 tablet by mouth 4 (four) times daily as needed for pain.) 150 tablet 0 Past Week   rOPINIRole (REQUIP) 3 MG tablet TAKE 1 TABLET BY MOUTH AT BEDTIME AS NEEDED (Patient taking differently: Take 3 mg by mouth at bedtime.) 90 tablet 3 12/06/2022   predniSONE (DELTASONE) 5 MG tablet TAKE 1 TABLET BY MOUTH DAILY 90 tablet 3 12/05/2022   tirzepatide (MOUNJARO) 2.5 MG/0.5ML Pen Inject 2.5 mg into the skin once a week. (Patient not taking: Reported on 12/07/2022) 2 mL 0 Not Taking   tirzepatide [facility]) 5 MG/0.5ML Pen Inject 5 mg into the skin once a week. (Patient not taking: Reported on 12/07/2022) 6 mL 0 Not Taking    Assessment: 72 yo F presents with abdominal pain and  CT A/P reveals thrombosed SMV and first order tributaries, main portal vein, proximal left and proximal right portal veins and proximal splenic vein thrombosis with mesenteric edema. Pt has a history of CVA in 06/21 along with previous LV apical thrombus in 06/21. Pt was treated with apixaban for LV thrombus, but apixaban was de-escalated to aspirin and clopidogrel in 08/23 due to resolution of LV thrombus on f/u imaging. Of note, patient had a previously low-medium positive anticoardiolipin antibody IgM when tested in 2018.  Pharmacy has been consulted to switch from heparin infusion to apixaban for treatment of mesenteric, portal, and splenic vein thromboses. Patient has been mostly therapeutic on heparin for the past 3 days, but would recommend full load with apixaban.   Goal of Therapy:  Heparin level 0.3-0.7 units/ml Monitor platelets by anticoagulation protocol: Yes   Plan:  STOP heparin infusion START apixaban 10 mg BID x7 days, then 5 mg BID  Monitor daily CBC, s/sx of bleeding   Jani Gravel, PharmD Clinical Pharmacist  12/10/2022 8:19 AM

## 2022-12-10 NOTE — Discharge Instructions (Addendum)
Information on my medicine - ELIQUIS (apixaban)  Why was Eliquis prescribed for you? Eliquis was prescribed to treat blood clots that may have been found in the veins of your legs (deep vein thrombosis) or in your lungs (pulmonary embolism) and to reduce the risk of them occurring again.  What do You need to know about Eliquis ? The starting dose is 10 mg (two 5 mg tablets) taken TWICE daily for the FIRST SEVEN (7) DAYS, then on (enter date)  12/17/22  the dose is reduced to ONE 5 mg tablet taken TWICE daily.  Eliquis may be taken with or without food.   Try to take the dose about the same time in the morning and in the evening. If you have difficulty swallowing the tablet whole please discuss with your pharmacist how to take the medication safely.  Take Eliquis exactly as prescribed and DO NOT stop taking Eliquis without talking to the doctor who prescribed the medication.  Stopping may increase your risk of developing a new blood clot.  Refill your prescription before you run out.  After discharge, you should have regular check-up appointments with your healthcare provider that is prescribing your Eliquis.    What do you do if you miss a dose? If a dose of ELIQUIS is not taken at the scheduled time, take it as soon as possible on the same day and twice-daily administration should be resumed. The dose should not be doubled to make up for a missed dose.  Important Safety Information A possible side effect of Eliquis is bleeding. You should call your healthcare provider right away if you experience any of the following: Bleeding from an injury or your nose that does not stop. Unusual colored urine (red or dark brown) or unusual colored stools (red or black). Unusual bruising for unknown reasons. A serious fall or if you hit your head (even if there is no bleeding).  Some medicines may interact with Eliquis and might increase your risk of bleeding or clotting while on Eliquis. To  help avoid this, consult your healthcare provider or pharmacist prior to using any new prescription or non-prescription medications, including herbals, vitamins, non-steroidal anti-inflammatory drugs (NSAIDs) and supplements.  This website has more information on Eliquis (apixaban): http://www.eliquis.com/eliquis/home

## 2022-12-10 NOTE — TOC Benefit Eligibility Note (Signed)
Patient Product/process development scientist completed.    The patient is insured through [employer]. Patient has Medicare and is not eligible for a copay card, but may be able to apply for patient assistance, if available.    Ran test claim for Eliquis Starter Pack and the current 30 day co-pay is $45.00.   This test claim was processed through [facility]- copay amounts may vary at other pharmacies due to pharmacy/plan contracts, or as the patient moves through the different stages of their insurance plan.     Roland Earl, CPHT Pharmacy Patient Advocate Specialist [facility] Patient Advocate Team Direct Number: [phone]  Fax: [phone]

## 2022-12-11 DIAGNOSIS — K55069 Acute infarction of intestine, part and extent unspecified: Secondary | ICD-10-CM | POA: Diagnosis not present

## 2022-12-11 LAB — CBC
HCT: 36.6 % (ref 36.0–46.0)
Hemoglobin: 11.8 g/dL — ABNORMAL LOW (ref 12.0–15.0)
MCH: 30.3 pg (ref 26.0–34.0)
MCHC: 32.2 g/dL (ref 30.0–36.0)
MCV: 93.8 fL (ref 80.0–100.0)
Platelets: 245 10*3/uL (ref 150–400)
RBC: 3.9 MIL/uL (ref 3.87–5.11)
RDW: 13 % (ref 11.5–15.5)
WBC: 4.8 10*3/uL (ref 4.0–10.5)
nRBC: 0 % (ref 0.0–0.2)

## 2022-12-11 LAB — COMPREHENSIVE METABOLIC PANEL
ALT: 29 U/L (ref 0–44)
AST: 64 U/L — ABNORMAL HIGH (ref 15–41)
Albumin: 2.4 g/dL — ABNORMAL LOW (ref 3.5–5.0)
Alkaline Phosphatase: 100 U/L (ref 38–126)
Anion gap: 8 (ref 5–15)
BUN: 13 mg/dL (ref 8–23)
CO2: 26 mmol/L (ref 22–32)
Calcium: 8.2 mg/dL — ABNORMAL LOW (ref 8.9–10.3)
Chloride: 99 mmol/L (ref 98–111)
Creatinine, Ser: 0.97 mg/dL (ref 0.44–1.00)
GFR, Estimated: >60 mL/min (ref >60)
Glucose, Bld: 108 mg/dL — ABNORMAL HIGH (ref 70–99)
Potassium: 3.1 mmol/L — ABNORMAL LOW (ref 3.5–5.1)
Sodium: 133 mmol/L — ABNORMAL LOW (ref 135–145)
Total Bilirubin: 0.5 mg/dL (ref 0.3–1.2)
Total Protein: 6 g/dL — ABNORMAL LOW (ref 6.5–8.1)

## 2022-12-11 MED ORDER — ALUM & MAG HYDROXIDE-SIMETH 200-200-20 MG/5ML PO SUSP
30.0000 mL | ORAL | Status: DC | PRN
  Administered 2022-12-11: 30 mL via ORAL
  Filled 2022-12-11: qty 30

## 2022-12-11 MED ORDER — OXYCODONE HCL 10 MG PO TABS: 10.0000 mg | ORAL_TABLET | ORAL | 0 refills | Status: AC | PRN

## 2022-12-11 MED ORDER — POTASSIUM CHLORIDE CRYS ER 20 MEQ PO TBCR
40.0000 meq | EXTENDED_RELEASE_TABLET | Freq: Two times a day (BID) | ORAL | Status: AC
  Administered 2022-12-11 (×2): 40 meq via ORAL
  Filled 2022-12-11 (×2): qty 2

## 2022-12-11 MED ORDER — APIXABAN 5 MG PO TABS: ORAL_TABLET | ORAL | 0 refills | Status: DC

## 2022-12-11 MED ORDER — MAGNESIUM SULFATE 2 GM/50ML IV SOLN
2.0000 g | Freq: Once | INTRAVENOUS | Status: AC
  Administered 2022-12-11: 2 g via INTRAVENOUS
  Filled 2022-12-11: qty 50

## 2022-12-11 MED ORDER — ALBUTEROL SULFATE (2.5 MG/3ML) 0.083% IN NEBU: 2.5000 mg | INHALATION_SOLUTION | Freq: Four times a day (QID) | RESPIRATORY_TRACT | 12 refills | Status: DC | PRN

## 2022-12-11 MED ORDER — OXYCODONE HCL 10 MG PO TABS: 10.0000 mg | ORAL_TABLET | ORAL | 0 refills | Status: DC | PRN

## 2022-12-11 MED ORDER — LIDOCAINE VISCOUS HCL 2 % MT SOLN: 15.0000 mL | OROMUCOSAL | Status: DC | PRN

## 2022-12-11 NOTE — Discharge Summary (Signed)
Physician Discharge Summary   Patient: [name] Good MRN: [id number] DOB: 27-Apr-1951  Admit date:     12/07/2022  Discharge date: 12/11/22  Discharge Physician: Bobette Mo   PCP: Ardith Dark, MD   Discharge Diagnoses: Principal Problem:   Superior mesenteric vein thrombosis (HCC) Active Problems:   History of CVA (cerebrovascular accident)   Elevated liver function tests   Chronic pain syndrome   Autoimmune disease (HCC)   Arthritis   Depression   GERD (gastroesophageal reflux disease)   Obesity (BMI 35.0-39.9 without comorbidity)   Hypokalemia  Hospital Course: Brief Narrative:  [name] Good is a 72 y.o. female with medical history significant of CAD, CVA, apical aneurysm with LV thrombus, prediabetes, arthritis, chronic pain, anxiety, depression, GERD (and questionable diagnosis of Lupus) who presents with complaints of abdominal pain - notes her only new medication is Mounjaro - reports diarrhea which then resolved but subsequently had incredible/intractable anterior abdominal pain any time she ate/drank anything. CT at intake consistent with profound thrombus burden involving the superior mesenteric vein, main portal vein, proximal left/right portal veins, and proximal splenic vein. Hospitalist called for admission, vascular surgery called in consult.   Assessment and Plan:  Acute symptomatic mesenteric thrombus, large burden - CT confirms thrombus involving superior mesenteric vein, main portal vein, proximal left/right portal veins, and proximal splenic vein. - Transition to Eliquis - stop heparin; continue home plavix - Anticoagulation will be lifelong given recurrent thrombus without clear cause/etiology -Hypercoagulable workup pending - previously evaluated by Rheum with questionable diagnosis of Lupus (repeat dsDNA positive here) -Follow up outpatient with heme-onc -Pain much better today.  Will be discharging home. - Appreciate vascular surgery insight -  no current plan/indication for procedure.   History of CVA Apical aneurysm with LV thrombus Questionable hypercoagulable state - Hypercoagulable workup labs sent off - dsDNA elevated(26 - previously 15 - normal 0-9) - remainder of nuclear Ab panel neg - AntiJo negative - Previously treated with Eliquis but was discontinued after resolution of thrombus. - Continue Plavix as recommended per vascular surgery for atherosclerosis - Plan to transition back to DOAC at discharge   Elevated liver function studies, resolved -Likely secondary to above, resolved   Chronic pain syndrome Secondary to joint and back pain -Continue pain regimen as noted above   Autoimmune disorder, unspecified Rule out Lupus Unspecified arthritis On chronic immunosuppressive therapy Records note patient had previously been seen by Dr. 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 G6PDH testing also done in the past WNL ANA positive history Positive anticardiolipin antibody IgM She temporarily had been on methotrexate-->Plaquenil -now discontinued.   Patient only on prednisone 5 mg daily chronically - continue Anti dsDNA repeat test positive as above Extractable nuclear antigen test panel pending    - if 4 panel = Anti-RNP, Anti-Sm, Anti-SS-A(Ro), and Anti-SS-B(La)    - if 6 panel = Includes the 4 above as well as: Anti-Scl-70 and Anti-Jo-1   Depression -Continue Lexapro   GERD -Change Pepcid to scheduled while on anticoagulation   Obesity BMI 39.32 kg/m.  Patient reports symptoms started after being started on [facility].  Mounjaro known to cause nausea, vomiting, decreased appetite, and abdominal pain which patient was having.  However, not known to put patients at increased risk for clots which is thought to be the cause for her symptoms.   Pain control - [employer] Controlled Substance Reporting System database was reviewed. and patient  was  instructed, not to drive, operate heavy machinery, perform activities at heights, swimming or participation in water activities or provide baby-sitting services while on Pain, Sleep and Anxiety Medications; until their outpatient Physician has advised to do so again. Also recommended to not to take more than prescribed Pain, Sleep and Anxiety Medications.  Consultants:  Procedures performed:   Disposition: Home Diet recommendation:  Cardiac diet DISCHARGE MEDICATION:    Allergies as of 12/11/2022       Reactions   Amoxicillin Other (See Comments)   Tolerated Zosyn Has patient had a PCN reaction causing immediate rash, facial/tongue/throat swelling, SOB or lightheadedness with hypotension: No Has patient had a PCN reaction causing severe rash involving mucus membranes or skin necrosis: No Has patient had a PCN reaction that required hospitalization No Has patient had a PCN reaction occurring within the last 10 years: No If all of the above answers are "NO", then may proceed with Cephalosporin use.        Medication List     STOP taking these medications    tirzepatide 2.5 MG/0.5ML Pen Commonly known as: MOUNJARO   tirzepatide 5 MG/0.5ML Pen Commonly known as: MOUNJARO       TAKE these medications    albuterol (2.5 MG/3ML) 0.083% nebulizer solution Commonly known as: PROVENTIL Take 3 mLs (2.5 mg total) by nebulization every 6 (six) hours as needed for wheezing or shortness of breath.   apixaban 5 MG Tabs tablet Commonly known as: ELIQUIS Take 2 tablets (10 mg total) by mouth 2 (two) times daily for 5 days, THEN 1 tablet (5 mg total) 2 (two) times daily. Start taking on: December 11, 2022   atorvastatin 80 MG tablet Commonly known as: LIPITOR Take 1 tablet (80 mg total) by mouth daily.   baclofen 10 MG tablet Commonly known as: LIORESAL TAKE 1 TABLET BY MOUTH 3 TIMES DAILY   calcium carbonate 750 MG chewable tablet Commonly known as: TUMS EX Chew 1 tablet by  mouth 2 (two) times daily as needed for heartburn.   clopidogrel 75 MG tablet Commonly known as: PLAVIX Take 1 tablet (75 mg total) by mouth daily. Please call [phone] to schedule an overdue appointment for future refills. Thank you.   diphenhydrAMINE 25 mg capsule Commonly known as: BENADRYL Take 25 mg by mouth every 6 (six) hours as needed for sleep or allergies.   docusate sodium 100 MG capsule Commonly known as: COLACE Take 100 mg by mouth 2 (two) times daily.   escitalopram 20 MG tablet Commonly known as: LEXAPRO Take 1 tablet (20 mg total) by mouth daily.   ezetimibe 10 MG tablet Commonly known as: Zetia Take 1 tablet (10 mg total) by mouth daily.   famotidine 20 MG tablet Commonly known as: PEPCID Take 20 mg by mouth daily as needed for heartburn or indigestion.   ondansetron 4 MG tablet Commonly known as: Zofran Take 1 tablet (4 mg total) by mouth every 8 (eight) hours as needed for nausea or vomiting.   Oxycodone HCl 10 MG Tabs Take 1 tablet (10 mg total) by mouth every 4 (four) hours as needed for up to 5 days.   oxyCODONE-acetaminophen 10-325 MG tablet Commonly known as: PERCOCET TAKE 1 TABLET BY MOUTH 5 TIMES DAILY What changed:  how much to take how to take this when to take this reasons to take this additional instructions   predniSONE 5 MG tablet Commonly known as: DELTASONE TAKE 1 TABLET BY MOUTH DAILY   rOPINIRole 3 MG tablet  Commonly known as: REQUIP TAKE 1 TABLET BY MOUTH AT BEDTIME AS NEEDED What changed: when to take this        Allergies  Allergen Reactions   Amoxicillin Other (See Comments)    Tolerated Zosyn Has patient had a PCN reaction causing immediate rash, facial/tongue/throat swelling, SOB or lightheadedness with hypotension: No Has patient had a PCN reaction causing severe rash involving mucus membranes or skin necrosis: No Has patient had a PCN reaction that required hospitalization No Has patient had a PCN reaction  occurring within the last 10 years: No If all of the above answers are "NO", then may proceed with Cephalosporin use.    Consultations:    Procedures/Studies: US Abdomen Limited RUQ (LIVER/GB)  Result Date: 12/07/2022 CLINICAL DATA:  Epigastric pain.  Prior cholecystectomy. EXAM: ULTRASOUND ABDOMEN LIMITED RIGHT UPPER QUADRANT COMPARISON:  Abdomen and pelvis CT 12/07/2022 FINDINGS: Gallbladder: Surgically absent. Common bile duct: Diameter: 4-5 mm Liver: Echogenic liver parenchyma with heterogeneous echotexture diffusely. Portal vein not well visualized consistent with thrombosis reported on CT imaging performed just prior to this exam. Other: None. IMPRESSION: 1. Echogenic liver parenchyma with heterogeneous echotexture. Imaging features suggest hepatic steatosis. 2. Portal vein not well visualized consistent with thrombosis reported on CT imaging performed just prior to this exam. Electronically Signed   By: Kennith Center M.D.   On: 12/07/2022 08:09   CT ABDOMEN PELVIS W CONTRAST  Result Date: 12/07/2022 CLINICAL DATA:  Abdominal pain following Tirzepatide injection on August 1. Occasional diarrhea. EXAM: CT ABDOMEN AND PELVIS WITH CONTRAST TECHNIQUE: Multidetector CT imaging of the abdomen and pelvis was performed using the standard protocol following bolus administration of intravenous contrast. RADIATION DOSE REDUCTION: This exam was performed according to the departmental dose-optimization program which includes automated exposure control, adjustment of the mA and/or kV according to patient size and/or use of iterative reconstruction technique. CONTRAST:  75mL OMNIPAQUE IOHEXOL 350 MG/ML SOLN COMPARISON:  None Available. FINDINGS: Lower chest: Cardiac size is normal. There is calcification in the circumflex coronary artery. Lung bases are clear of infiltrates. Hepatobiliary: The liver is moderately steatotic. The left lobe is larger than the right which may be seen in various stages of cirrhosis  with slightly lobular contour extending over portions of the hepatic undersurface. There are regional perfusion differences which are most likely due to also noted portal vein thrombosis. There is no mass enhancement. The gallbladder is absent, with no biliary dilatation. Pancreas: Partially atrophic. There are scattered punctate parenchymal calcifications that are compatible with chronic calcific pancreatitis. Edema in the mesenteric root fat does extend up to the pancreatic head but this is probably related to SMV thrombosis rather than pancreatitis. Correlation with serum lipase is suggested. There is no mass enhancement or ductal dilatation. Spleen: No splenomegaly or mass. Adrenals/Urinary Tract: No adrenal mass or renal mass enhancement. There is patchy cortical scarring and asymmetric volume loss in the left kidney, small wedge-shaped scar-like defect lower pole right kidney. There is no urinary stone or obstruction. Both of the renal veins and arteries opacify but there are suspected debris atherosclerotic origin stenoses of both renal arteries. There is mild bladder thickening versus underdistention. The bladder is not optimally demonstrated due to metal artifact from a right hip replacement. Stomach/Bowel: Small hiatal hernia. The stomach is contracted. There are small descending duodenal diverticula. The rest of the small bowel is unremarkable. No dilatation or inflammatory change is seen. An appendix is not seen in this patient. There are left colonic diverticula heaviest in  the distal descending and sigmoid but no wall thickening or inflammatory change. Vascular/Lymphatic: There is thrombosis in the SMV and first order tributaries, continuing up into the main portal vein and the proximal left and proximal right portal veins, and into the proximal splenic vein. Flow is seen in the mid to distal splenic vein and its hilar tributaries. Systemic venous opacification is preserved including in the pelvis.  There is aortoiliac and visceral branch vessel atherosclerosis. No enlarged lymph nodes are seen. Reproductive: Uterus and bilateral adnexa are unremarkable. Other: There is mesenteric edema primarily in the root fat extending into the lower abdomen and ileocolic mesentery as well. This is almost certainly congestive or due to the SMV thrombosis. Inflammatory component is not strictly excluded. There is minimal pelvic ascites and minimal abdominal ascites in the pericolic gutters but there is no drainable pocket. There is no free air. No bowel pneumatosis. Musculoskeletal: Osteopenia with advanced degenerative change of the thoracic and lumbar spine. Degenerative change left hip with right hip replacement. Acquired spinal stenosis at L4-5 and L3-4 as well as multilevel lumbar acquired foraminal stenosis. There is moderate lower plate wedging of the L2 vertebral body. There is a well corticated bone fragment at the anterior aspect of the L3 body. Findings most likely reflect remote trauma. No acute or other significant osseous findings. Slight levoscoliosis. IMPRESSION: 1. Thrombosed SMV and first order tributaries, main portal vein, proximal left and proximal right portal veins and proximal splenic vein thrombosis. 2. Mesenteric edema primarily in the root fat extending into the lower abdomen and ileocolic mesentery. This is almost certainly congestive due to the SMV thrombosis. Inflammatory component is not strictly excluded. There is no bowel pneumatosis or free air. 3. Minimal abdominal and pelvic ascites. 4. Moderate hepatic steatosis with lobular hepatic contour which may be seen in various stages of cirrhosis. No mass enhancement. Slight lobulation of portions of the hepatic undersurface. 5. Chronic calcific pancreatitis. No findings of acute pancreatitis. 6. Aortic and coronary artery atherosclerosis. 7. Left greater than right renal cortical scarring and volume loss. Suspected flow-limiting bilateral renal  artery origin stenoses. 8. Cystitis versus bladder nondistention. 9. Osteopenia and degenerative change. 10. Remaining findings described above. 11. These results will be called to the ordering clinician or representative by the Radiologist Assistant, and communication documented in the PACS or [employer]. Aortic Atherosclerosis (ICD10-I70.0). Electronically Signed   By: Almira Bar M.D.   On: 12/07/2022 07:35    Subjective:   Discharge Exam: Vitals:   12/11/22 0722 12/11/22 1455  BP: 121/69 129/70  Pulse: 91 98  Resp:    Temp: 99.2 F (37.3 C)   SpO2: 97% 100%   General: Pt is alert, awake, not in acute distress Cardiovascular: RRR, S1/S2 +, no rubs, no gallops Respiratory: CTA bilaterally, no wheezing, no rhonchi Abdominal: Soft, NT, ND, bowel sounds + Extremities: no edema, no cyanosis  Microbiology: No results found for this or any previous visit (from the past 240 hour(s)).   Labs: BNP (last 3 results) No results for input(s): "BNP" in the last 8760 hours. Basic Metabolic Panel: Recent Labs  Lab 12/07/22 0141 12/08/22 0208 12/09/22 0656 12/10/22 0647 12/11/22 0359  NA 136 132* 133* 133* 133*  K 3.7 3.8 3.7 3.3* 3.1*  CL 99 96* 98 100 99  CO2 23 26 26 22 26   GLUCOSE 115* 102* 111* 92 108*  BUN 10 9 11 12 13   CREATININE 0.85 0.80 0.88 0.83 0.97  CALCIUM 8.6* 8.4* 8.3* 8.3* 8.2*  Liver Function Tests: Recent Labs  Lab 12/07/22 0141 12/08/22 0208 12/09/22 0656 12/10/22 0647 12/11/22 0359  AST 45* 33 37 49* 64*  ALT 24 24 23 25 29   ALKPHOS 116 107 93 104 100  BILITOT 1.3* 0.8 0.9 0.4 0.5  PROT 7.0 6.8 6.5 6.5 6.0*  ALBUMIN 2.8* 2.7* 2.6* 2.5* 2.4*   Recent Labs  Lab 12/07/22 0141  LIPASE 39    Follow-up Information     Ardith Dark, MD Follow up.   Specialty: Family Medicine Contact information: [address] [place] [address] [phone]                 Condition at discharge: good  The results of significant  diagnostics from this hospitalization (including imaging, microbiology, ancillary and laboratory) are listed below for reference.   Imaging Studies: US Abdomen Limited RUQ (LIVER/GB)  Result Date: 12/07/2022 CLINICAL DATA:  Epigastric pain.  Prior cholecystectomy. EXAM: ULTRASOUND ABDOMEN LIMITED RIGHT UPPER QUADRANT COMPARISON:  Abdomen and pelvis CT 12/07/2022 FINDINGS: Gallbladder: Surgically absent. Common bile duct: Diameter: 4-5 mm Liver: Echogenic liver parenchyma with heterogeneous echotexture diffusely. Portal vein not well visualized consistent with thrombosis reported on CT imaging performed just prior to this exam. Other: None. IMPRESSION: 1. Echogenic liver parenchyma with heterogeneous echotexture. Imaging features suggest hepatic steatosis. 2. Portal vein not well visualized consistent with thrombosis reported on CT imaging performed just prior to this exam. Electronically Signed   By: Kennith Center M.D.   On: 12/07/2022 08:09   CT ABDOMEN PELVIS W CONTRAST  Result Date: 12/07/2022 CLINICAL DATA:  Abdominal pain following Tirzepatide injection on August 1. Occasional diarrhea. EXAM: CT ABDOMEN AND PELVIS WITH CONTRAST TECHNIQUE: Multidetector CT imaging of the abdomen and pelvis was performed using the standard protocol following bolus administration of intravenous contrast. RADIATION DOSE REDUCTION: This exam was performed according to the departmental dose-optimization program which includes automated exposure control, adjustment of the mA and/or kV according to patient size and/or use of iterative reconstruction technique. CONTRAST:  75mL OMNIPAQUE IOHEXOL 350 MG/ML SOLN COMPARISON:  None Available. FINDINGS: Lower chest: Cardiac size is normal. There is calcification in the circumflex coronary artery. Lung bases are clear of infiltrates. Hepatobiliary: The liver is moderately steatotic. The left lobe is larger than the right which may be seen in various stages of cirrhosis with slightly  lobular contour extending over portions of the hepatic undersurface. There are regional perfusion differences which are most likely due to also noted portal vein thrombosis. There is no mass enhancement. The gallbladder is absent, with no biliary dilatation. Pancreas: Partially atrophic. There are scattered punctate parenchymal calcifications that are compatible with chronic calcific pancreatitis. Edema in the mesenteric root fat does extend up to the pancreatic head but this is probably related to SMV thrombosis rather than pancreatitis. Correlation with serum lipase is suggested. There is no mass enhancement or ductal dilatation. Spleen: No splenomegaly or mass. Adrenals/Urinary Tract: No adrenal mass or renal mass enhancement. There is patchy cortical scarring and asymmetric volume loss in the left kidney, small wedge-shaped scar-like defect lower pole right kidney. There is no urinary stone or obstruction. Both of the renal veins and arteries opacify but there are suspected debris atherosclerotic origin stenoses of both renal arteries. There is mild bladder thickening versus underdistention. The bladder is not optimally demonstrated due to metal artifact from a right hip replacement. Stomach/Bowel: Small hiatal hernia. The stomach is contracted. There are small descending duodenal diverticula. The rest of the small bowel  is unremarkable. No dilatation or inflammatory change is seen. An appendix is not seen in this patient. There are left colonic diverticula heaviest in the distal descending and sigmoid but no wall thickening or inflammatory change. Vascular/Lymphatic: There is thrombosis in the SMV and first order tributaries, continuing up into the main portal vein and the proximal left and proximal right portal veins, and into the proximal splenic vein. Flow is seen in the mid to distal splenic vein and its hilar tributaries. Systemic venous opacification is preserved including in the pelvis. There is  aortoiliac and visceral branch vessel atherosclerosis. No enlarged lymph nodes are seen. Reproductive: Uterus and bilateral adnexa are unremarkable. Other: There is mesenteric edema primarily in the root fat extending into the lower abdomen and ileocolic mesentery as well. This is almost certainly congestive or due to the SMV thrombosis. Inflammatory component is not strictly excluded. There is minimal pelvic ascites and minimal abdominal ascites in the pericolic gutters but there is no drainable pocket. There is no free air. No bowel pneumatosis. Musculoskeletal: Osteopenia with advanced degenerative change of the thoracic and lumbar spine. Degenerative change left hip with right hip replacement. Acquired spinal stenosis at L4-5 and L3-4 as well as multilevel lumbar acquired foraminal stenosis. There is moderate lower plate wedging of the L2 vertebral body. There is a well corticated bone fragment at the anterior aspect of the L3 body. Findings most likely reflect remote trauma. No acute or other significant osseous findings. Slight levoscoliosis. IMPRESSION: 1. Thrombosed SMV and first order tributaries, main portal vein, proximal left and proximal right portal veins and proximal splenic vein thrombosis. 2. Mesenteric edema primarily in the root fat extending into the lower abdomen and ileocolic mesentery. This is almost certainly congestive due to the SMV thrombosis. Inflammatory component is not strictly excluded. There is no bowel pneumatosis or free air. 3. Minimal abdominal and pelvic ascites. 4. Moderate hepatic steatosis with lobular hepatic contour which may be seen in various stages of cirrhosis. No mass enhancement. Slight lobulation of portions of the hepatic undersurface. 5. Chronic calcific pancreatitis. No findings of acute pancreatitis. 6. Aortic and coronary artery atherosclerosis. 7. Left greater than right renal cortical scarring and volume loss. Suspected flow-limiting bilateral renal artery  origin stenoses. 8. Cystitis versus bladder nondistention. 9. Osteopenia and degenerative change. 10. Remaining findings described above. 11. These results will be called to the ordering clinician or representative by the Radiologist Assistant, and communication documented in the PACS or [employer]. Aortic Atherosclerosis (ICD10-I70.0). Electronically Signed   By: Almira Bar M.D.   On: 12/07/2022 07:35    Discharge time spent: greater than 30 minutes.  Signed: Bobette Mo, MD [facility] 12/11/2022  This document was prepared using Dragon voice recognition software and may contain some unintended transcription errors.

## 2022-12-11 NOTE — Progress Notes (Signed)
Consult received : Eliqius coupon?. NCM provided pt an Eliquis 30 day free coupon card with explanation. Pt verbally stated understanding. Gae Gallop RN,BSN,CM [phone]

## 2022-12-11 NOTE — Plan of Care (Signed)

## 2022-12-14 ENCOUNTER — Telehealth: Payer: Self-pay | Admitting: *Deleted

## 2022-12-14 NOTE — Transitions of Care (Post Inpatient/ED Visit) (Signed)
   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 pt has not taken this med before, no respiratory symptoms to indicate the use of it. Patient Questions/Concerns Addressed: Provided Patient Educational Materials (per Chart review, there is not indication of the need to use the neb Solution, pt doesn't have nabulizer at home. scheduled a F/u with Dr. [name] and advised pt to bring it with her to the visit.)  Items Reviewed: Did you receive and understand the discharge instructions provided?: Yes Medications obtained,verified, and reconciled?: Yes (Medications Reviewed) Any new allergies since your discharge?: No Dietary orders reviewed?: NA Do you have support at home?: Yes People in Home: child(ren), adult, spouse  Medications Reviewed Today: Medications Reviewed Today   Medications were not reviewed in this encounter     Home Care and Equipment/Supplies: Were Home Health Services Ordered?: NA Any new equipment or medical supplies ordered?: NA  Functional Questionnaire: Do you need assistance with bathing/showering or dressing?: Yes Do you need assistance with meal preparation?: No Do you need assistance with eating?: No Do you have difficulty maintaining continence: No Do you need assistance with getting out of  bed/getting out of a chair/moving?: No Do you have difficulty managing or taking your medications?: No  Follow up appointments reviewed: PCP Follow-up appointment confirmed?: Yes Date of PCP follow-up appointment?: 12/17/22 Follow-up Provider: [facility] Follow-up appointment confirmed?: No Do you need transportation to your follow-up appointment?: No Do you understand care options if your condition(s) worsen?: Yes-patient verbalized understanding    SIGNATURE: Zoe Lan, RN

## 2022-12-15 LAB — FACTOR 5 LEIDEN

## 2022-12-17 ENCOUNTER — Inpatient Hospital Stay: Payer: Medicare HMO | Admitting: Family Medicine

## 2022-12-25 ENCOUNTER — Telehealth: Payer: Self-pay | Admitting: Family Medicine

## 2022-12-25 ENCOUNTER — Encounter: Payer: Self-pay | Admitting: Family Medicine

## 2022-12-25 ENCOUNTER — Ambulatory Visit: Payer: Medicare HMO | Admitting: Family Medicine

## 2022-12-25 VITALS — BP 119/77 | HR 103 | Temp 97.7°F | Ht 62.0 in | Wt 210.0 lb

## 2022-12-25 DIAGNOSIS — E1169 Type 2 diabetes mellitus with other specified complication: Secondary | ICD-10-CM | POA: Diagnosis not present

## 2022-12-25 DIAGNOSIS — K55069 Acute infarction of intestine, part and extent unspecified: Secondary | ICD-10-CM | POA: Diagnosis not present

## 2022-12-25 DIAGNOSIS — M359 Systemic involvement of connective tissue, unspecified: Secondary | ICD-10-CM | POA: Diagnosis not present

## 2022-12-25 MED ORDER — OXYCODONE-ACETAMINOPHEN 10-325 MG PO TABS: ORAL_TABLET | ORAL | 0 refills | Status: DC

## 2022-12-25 NOTE — Assessment & Plan Note (Signed)
No longer on Mounjaro and wishes to avoid any GLP-1 agonist going forward.  She will come back in a couple months and we will recheck A1c at that time.  We did discuss that the Greggory Keen is not known to cause any issues with thrombosis however would be reasonable to hold off on this going forward.

## 2022-12-25 NOTE — Telephone Encounter (Signed)
Prescription Request  12/25/2022  LOV: 12/25/2022  What is the name of the medication or equipment? atorvastatin (LIPITOR) 80 MG tablet  AND apixaban (ELIQUIS) 5 MG TABS tablet   Have you contacted your pharmacy to request a refill? Yes   Which pharmacy would you like this sent to?  [id number] - [place], [place] - [address] [place] [address] Phone: [phone] Fax: [phone]      Patient notified that their request is being sent to the clinical staff for review and that they should receive a response within 2 business days.   Please advise at Mobile There is no such number on file (mobile).

## 2022-12-25 NOTE — Assessment & Plan Note (Addendum)
Previously has seen rheumatologist for this however this was about 6 years ago or so.  Notable for positive anti-DNA and positive ANA.  Had similar workup recently during hospitalization.  Likely a contributor for her recurrent thrombotic events though she is also having diffuse polyarthralgia that is likely due to her autoimmune disease as well.  Will be referring to rheumatology for ongoing management evaluation.  She has been on chronic prednisone 5 mg daily for the last couple of years.  Looks like they had tried Plaquenil in the past however she does not recall this and is not sure if she ever tried this previously.

## 2022-12-25 NOTE — Patient Instructions (Signed)
It was very nice to see you today!  We will need to keep you on the blood thinner indefinitely.  I will refer you to the hematologist for further evaluation.  You probably have lupus.  I will refer you to the rheumatologist for further evaluation.  We will not do any other injectable medications for the diabetes at this point.  We can recheck again in 2 months.  Return in about 2 months (around 02/24/2023).   Take care, Dr [name]  PLEASE NOTE:  If you had any lab tests, please let us know if you have not heard back within a few days. You may see your results on mychart before we have a chance to review them but we will give you a call once they are reviewed by [place].   If we ordered any referrals today, please let us know if you have not heard from their office within the next week.   If you had any urgent prescriptions sent in today, please check with the pharmacy within an hour of our visit to make sure the prescription was transmitted appropriately.   Please try these tips to maintain a healthy lifestyle:  Eat at least 3 REAL meals and 1-2 snacks per day.  Aim for no more than 5 hours between eating.  If you eat breakfast, please do so within one hour of getting up.   Each meal should contain half fruits/vegetables, one quarter protein, and one quarter carbs (no bigger than a computer mouse)  Cut down on sweet beverages. This includes juice, soda, and sweet tea.   Drink at least 1 glass of water with each meal and aim for at least 8 glasses per day  Exercise at least 150 minutes every week.

## 2022-12-25 NOTE — Progress Notes (Signed)
Chief Complaint:  [name] Good is a 72 y.o. female who presents today for a TCM visit.  Assessment/Plan:  Chronic Problems Addressed Today: Mesenteric thrombosis (HCC) Now anticoagulated on Eliquis 5 mg twice daily.  This is her second thrombotic event and will need to be on lifelong anticoagulation.  She did have a hypercoagulable workup performed in the hospital with concern for possible underlying autoimmune disorder-see below problem note.  She was referred to hematology during hospitalization for ongoing management.  We will check on referral today.  Autoimmune disease [facility]) Previously has seen rheumatologist for this however this was about 6 years ago or so.  Notable for positive anti-DNA and positive ANA.  Had similar workup recently during hospitalization.  Likely a contributor for her recurrent thrombotic events though she is also having diffuse polyarthralgia that is likely due to her autoimmune disease as well.  Will be referring to rheumatology for ongoing management evaluation.  She has been on chronic prednisone 5 mg daily for the last couple of years.  Looks like they had tried Plaquenil in the past however she does not recall this and is not sure if she ever tried this previously.  T2DM (type 2 diabetes mellitus) (HCC) No longer on Mounjaro and wishes to avoid any GLP-1 agonist going forward.  She will come back in a couple months and we will recheck A1c at that time.  We did discuss that the Greggory Keen is not known to cause any issues with thrombosis however would be reasonable to hold off on this going forward.    Subjective:  HPI:  Summary of Hospital admission: Reason for admission: Superior Mesenteric Vein Thrombosis  Date of admission: 12/07/2022 Date of discharge: 12/11/2022 Date of Interactive contact: 12/14/2022 Summary of Hospital course: Patient presented to the Emergency Department on 12/07/2022 with persistent abdominal pain.  CT scan in the ED showed thrombus  involving superior mesenteric vein, inguinal vein, proximal left and right portal veins, and proximal splenic vein.  She was admitted and vascular surgery was consulted.  She was started on anticoagulation initially with heparin however transition to Eliquis.  Vascular surgery did not find any indication for surgical intervention.  She was also initially found to have elevated LFTs however this resolved at the time of discharge.  There was concern for possible autoimmune disorder and labs were sent prior to her being discharged.  She improved clinically and was discharged home to continue oral Eliquis with recommendation to follow-up with hematology for her thrombosis and rheumatology for her underlying auto immune disorder.   Interim history:  Since being home, She has had a few recurrences of abdominal pain but this does seem to be improving. She is trying to eat smaller meals which helps. No nausea. Some constipation. No diarrhea.  She does not wish to be on Mounjaro or any other injectable medications for diabetes or weight loss going forward.  ROS: Per HPI, otherwise a complete review of systems was negative.   PMH:  The following were reviewed and entered/updated in epic: Past Medical History:  Diagnosis Date   Anxiety    Arthritis    Depression    GERD (gastroesophageal reflux disease)    Myocardial infarction (HCC) 89   no visits to cardiac dr(thomas kelly) since 89   Stroke [facility])    Wound dehiscence    lumbar   Patient Active Problem List   Diagnosis Date Noted   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   History of CVA (cerebrovascular accident)  12/07/2022   T2DM (type 2 diabetes mellitus) (HCC) 08/11/2022   Chronic pain syndrome 10/30/2021   Morbid obesity (HCC) 09/02/2021   Depression, major, single episode, moderate (HCC) 07/15/2021   Vertebral artery stenosis 07/15/2021   Cerebrovascular disease 07/15/2021   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   RLS (restless legs  syndrome) 10/02/2019   Arthritis 10/18/2017   Former smoker 11/13/2016   Autoimmune disease (HCC) 08/12/2016   Obesity (BMI 35.0-39.9 without comorbidity) 08/12/2016   Status post total replacement of right hip 10/25/2015   S/P lumbar laminectomy 06/14/2015   Past Surgical History:  Procedure Laterality Date   APPENDECTOMY     17 yrs   BACK SURGERY     BREAST SURGERY Left    cyst   BUBBLE STUDY  03/12/2020   Procedure: BUBBLE STUDY;  Surgeon: Orpah Cobb, MD;  Location: [facility] ENDOSCOPY;  Service: Cardiovascular;;   CARDIAC CATHETERIZATION  77   CHOLECYSTECTOMY     72 yrs old   LUMBAR LAMINECTOMY/DECOMPRESSION MICRODISCECTOMY N/A 06/14/2015   Procedure: Lumbar Three-Four,Lumbar Four-Five, Lumbar Five-Sacral One Laminectomy;  Surgeon: Tia Alert, MD;  Location: [facility] NEURO ORS;  Service: Neurosurgery;  Laterality: N/A;   LUMBAR WOUND DEBRIDEMENT N/A 07/24/2015   Procedure: lumbar wound revision;  Surgeon: Tia Alert, MD;  Location: [facility] NEURO ORS;  Service: Neurosurgery;  Laterality: N/A;   TEE WITHOUT CARDIOVERSION N/A 03/12/2020   Procedure: TRANSESOPHAGEAL ECHOCARDIOGRAM (TEE);  Surgeon: Orpah Cobb, MD;  Location: [facility] ENDOSCOPY;  Service: Cardiovascular;  Laterality: N/A;   TOTAL HIP ARTHROPLASTY Right 10/25/2015   Procedure: RIGHT TOTAL HIP ARTHROPLASTY ANTERIOR APPROACH;  Surgeon: Kathryne Hitch, MD;  Location: [facility] ORS;  Service: Orthopedics;  Laterality: Right;   TUBAL LIGATION      Family History  Problem Relation Age of Onset   Stroke Mother    Clotting disorder Neg Hx     Medications- Reconciled discharge and current medications in Epic.  Current Outpatient Medications  Medication Sig Dispense Refill   apixaban (ELIQUIS) 5 MG TABS tablet Take 2 tablets (10 mg total) by mouth 2 (two) times daily for 5 days, THEN 1 tablet (5 mg total) 2 (two) times daily. 60 tablet 0   atorvastatin (LIPITOR) 80 MG tablet Take 1 tablet (80 mg total) by mouth daily. 90 tablet 3    baclofen (LIORESAL) 10 MG tablet TAKE 1 TABLET BY MOUTH 3 TIMES DAILY 30 tablet 5   calcium carbonate (TUMS EX) 750 MG chewable tablet Chew 1 tablet by mouth 2 (two) times daily as needed for heartburn.     clopidogrel (PLAVIX) 75 MG tablet Take 1 tablet (75 mg total) by mouth daily. Please call [phone] to schedule an overdue appointment for future refills. Thank you. 30 tablet 0   diphenhydrAMINE (BENADRYL) 25 mg capsule Take 25 mg by mouth every 6 (six) hours as needed for sleep or allergies.     docusate sodium (COLACE) 100 MG capsule Take 100 mg by mouth 2 (two) times daily.     escitalopram (LEXAPRO) 20 MG tablet Take 1 tablet (20 mg total) by mouth daily. 90 tablet 3   ezetimibe (ZETIA) 10 MG tablet Take 1 tablet (10 mg total) by mouth daily. 30 tablet 3   famotidine (PEPCID) 20 MG tablet Take 20 mg by mouth daily as needed for heartburn or indigestion.     predniSONE (DELTASONE) 5 MG tablet TAKE 1 TABLET BY MOUTH DAILY 90 tablet 3   rOPINIRole (REQUIP) 3 MG tablet TAKE 1 TABLET  BY MOUTH AT BEDTIME AS NEEDED (Patient taking differently: Take 3 mg by mouth at bedtime.) 90 tablet 3   ondansetron (ZOFRAN) 4 MG tablet Take 1 tablet (4 mg total) by mouth every 8 (eight) hours as needed for nausea or vomiting. (Patient not taking: Reported on 12/25/2022) 20 tablet 0   oxyCODONE-acetaminophen (PERCOCET) 10-325 MG tablet TAKE 1 TABLET BY MOUTH 5 TIMES DAILY 150 tablet 0   No current facility-administered medications for this visit.    Allergies-reviewed and updated Allergies  Allergen Reactions   Amoxicillin Other (See Comments)    Tolerated Zosyn Has patient had a PCN reaction causing immediate rash, facial/tongue/throat swelling, SOB or lightheadedness with hypotension: No Has patient had a PCN reaction causing severe rash involving mucus membranes or skin necrosis: No Has patient had a PCN reaction that required hospitalization No Has patient had a PCN reaction occurring within the last  10 years: No If all of the above answers are "NO", then may proceed with Cephalosporin use.    Social History   Socioeconomic History   Marital status: Married    Spouse name: Briani Mariconda   Number of children: 4   Years of education: GED   Highest education level: GED or equivalent  Occupational History   Occupation: retired   Tobacco Use   Smoking status: Former    Current packs/day: 0.00    Average packs/day: 2.0 packs/day for 40.0 years (80.0 ttl pk-yrs)    Types: Cigarettes    Start date: 04/10/1973    Quit date: 04/10/2013    Years since quitting: 9.7   Smokeless tobacco: Never  Vaping Use   Vaping status: Former   Start date: 04/11/2013   Substances: Nicotine, Nicotine-salt  Substance and Sexual Activity   Alcohol use: Not Currently    Comment: rarely   Drug use: No   Sexual activity: Not Currently  Other Topics Concern   Not on file  Social History Narrative   Lives with husband   Right Handed   Drinks1-2 cups caffeine daily   Social Determinants of Health   Financial Resource Strain: Low Risk  (11/05/2022)   Overall Financial Resource Strain (CARDIA)    Difficulty of Paying Living Expenses: Not hard at all  Food Insecurity: No Food Insecurity (11/05/2022)   Hunger Vital Sign    Worried About Running Out of Food in the Last Year: Never true    Ran Out of Food in the Last Year: Never true  Transportation Needs: No Transportation Needs (11/05/2022)   PRAPARE - Administrator, Civil Service (Medical): No    Lack of Transportation (Non-Medical): No  Physical Activity: Inactive (11/05/2022)   Exercise Vital Sign    Days of Exercise per Week: 0 days    Minutes of Exercise per Session: 0 min  Stress: No Stress Concern Present (11/05/2022)   [employer] of Occupational Health - Occupational Stress Questionnaire    Feeling of Stress : Only a little  Social Connections: Moderately Isolated (11/05/2022)   Social Connection and Isolation Panel  [NHANES]    Frequency of Communication with Friends and Family: More than three times a week    Frequency of Social Gatherings with Friends and Family: Once a week    Attends Religious Services: Never    Database administrator or Organizations: No    Attends Banker Meetings: Never    Marital Status: Married        Objective:  Physical Exam: BP 119/77  Pulse (!) 103   Temp 97.7 F (36.5 C)   Ht 5\' 2"  (1.575 m)   Wt 210 lb (95.3 kg)   SpO2 95%   BMI 38.41 kg/m   Gen: NAD, resting comfortably Skin: Warm, dry Neuro: Grossly normal, moves all extremities Psych: Normal affect and thought content  Time Spent: 45 minutes of total time was spent on the date of the encounter performing the following actions: chart review prior to seeing the patient including recent hospitalization, obtaining history, performing a medically necessary exam, counseling on the treatment plan, placing orders, and documenting in our EHR.        [name]. [name], MD 12/25/2022 12:11 PM

## 2022-12-25 NOTE — Assessment & Plan Note (Addendum)
Now anticoagulated on Eliquis 5 mg twice daily.  This is her second thrombotic event and will need to be on lifelong anticoagulation.  She did have a hypercoagulable workup performed in the hospital with concern for possible underlying autoimmune disorder-see below problem note.  She was referred to hematology during hospitalization for ongoing management.  We will check on referral today.

## 2022-12-29 ENCOUNTER — Other Ambulatory Visit: Payer: Self-pay | Admitting: *Deleted

## 2022-12-29 MED ORDER — APIXABAN 5 MG PO TABS: ORAL_TABLET | ORAL | 0 refills | Status: DC

## 2022-12-29 MED ORDER — ATORVASTATIN CALCIUM 80 MG PO TABS: 80.0000 mg | ORAL_TABLET | Freq: Every day | ORAL | 3 refills | Status: DC

## 2022-12-29 NOTE — Telephone Encounter (Signed)
Unable to contact patient, no voice mail  

## 2022-12-29 NOTE — Telephone Encounter (Signed)
Ok with me. Please place any necessary orders. 

## 2022-12-29 NOTE — Telephone Encounter (Signed)
Rx  Eliguis last refill by Bobette Mo, MD   Rx Lipitor send to pharmacy

## 2022-12-29 NOTE — Telephone Encounter (Signed)
Rx send to [facility] pharmacy, patient aware

## 2022-12-31 ENCOUNTER — Telehealth: Payer: Self-pay | Admitting: Family Medicine

## 2022-12-31 NOTE — Telephone Encounter (Signed)
Pt was discharged from hospital and wanted a call back to talk about taking   atorvastatin (LIPITOR) 80 MG tablet   ezetimibe (ZETIA) 10 MG tablet   Please advise.

## 2022-12-31 NOTE — Telephone Encounter (Signed)
Need office visit to discuss medication

## 2023-01-01 ENCOUNTER — Ambulatory Visit (INDEPENDENT_AMBULATORY_CARE_PROVIDER_SITE_OTHER): Payer: Medicare HMO | Admitting: Family Medicine

## 2023-01-01 VITALS — BP 111/69 | HR 88 | Temp 97.5°F | Ht 62.0 in | Wt 208.0 lb

## 2023-01-01 DIAGNOSIS — E785 Hyperlipidemia, unspecified: Secondary | ICD-10-CM

## 2023-01-01 DIAGNOSIS — R42 Dizziness and giddiness: Secondary | ICD-10-CM

## 2023-01-01 DIAGNOSIS — K55069 Acute infarction of intestine, part and extent unspecified: Secondary | ICD-10-CM

## 2023-01-01 DIAGNOSIS — M359 Systemic involvement of connective tissue, unspecified: Secondary | ICD-10-CM

## 2023-01-01 DIAGNOSIS — E1169 Type 2 diabetes mellitus with other specified complication: Secondary | ICD-10-CM | POA: Diagnosis not present

## 2023-01-01 MED ORDER — RIVAROXABAN 20 MG PO TABS: 20.0000 mg | ORAL_TABLET | Freq: Every day | ORAL | 3 refills | Status: DC

## 2023-01-01 NOTE — Progress Notes (Unsigned)
[name] Good is a 72 y.o. female who presents today for an office visit.  Assessment/Plan:  New/Acute Problems: Dizziness This has been an intermittent issue for her for the last several years and had resolved for several months until returning a couple of weeks ago after being hospitalized for mesenteric vein thrombosis and being started on Eliquis.  She is concerned that the Eliquis may be causing her symptoms.  Discussed with patient that this is not a typical side effect of Eliquis.  She did see neurology last year who thought that her dizziness was likely due to a sequela of her previous stroke and potentially due to her vertebral artery stenosis however did not have any further management suggestions.    It would be reasonable to try switching to alternative DOAC to see if this does help with her symptoms.  We will switch to Xarelto 20 mg daily.  Will also check labs today including CBC and c-Met.  She will follow-up with me next week.  Discussed with patient I would be very hesitant to stop anticoagulation at this point due to her high risk of recurrent VTE given that she has had multiple episodes in the past. However due to being on anticoagulation dizziness resulting in fall could have major complications and thus we should try diligently to resolve her dizziness.  She has been referred to oncology to help with anticoagulation management and hopefully they can help assist [place] if she is not able to tolerate the Eliquis or Xarelto.   Chronic Problems Addressed Today: Mesenteric thrombosis (HCC) Anticoagulated on Eliquis 5 mg twice daily they will be switching to Xarelto 20 mg daily as above.  Will be checking labs today.  She does have a referral to hematology pending.  She will let us know if they have not contacted her by next week.  Autoimmune disease [facility]) Referral to rheumatology is pending.  Dyslipidemia associated with type 2 diabetes mellitus (HCC) On Lipitor 80 mg daily and  Zetia 10 mg daily.  Discussed that Zetia is unlikely to be causing her symptoms.  She will continue with her current regimen for now.     Subjective:  HPI:  See A/P for status of chronic conditions.  Her main concern today is dizziness.  This has been an intermittent issue for the last couple of years.  She did have this about a year ago and had workup with neurology and ENT.  Ultimately was thought that her dizziness was related to vertebral artery stenosis.  She saw neurology and they did not have anything else to offer.  She is already on maximal medical management and not a candidate for any sort of surgical intervention.  Her symptoms eventually resolved spontaneously until a couple of weeks ago.  She was recently in the hospital with a mesenteric vein thrombus.  She was restarted on Eliquis.  Since then she has had worsening dizziness similar to her previous episode a year or 2 ago.  She thinks that the dizziness is probably due to the Eliquis.  She has not had any other change in medications.  No weakness or numbness.  No vision changes.       Objective:  Physical Exam: BP 111/69   Pulse 88   Temp (!) 97.5 F (36.4 C) (Temporal)   Ht 5\' 2"  (1.575 m)   Wt 208 lb (94.3 kg)   SpO2 96%   BMI 38.04 kg/m   Gen: No acute distress, resting comfortably CV: Regular rate  and rhythm with no murmurs appreciated Pulm: Normal work of breathing, clear to auscultation bilaterally with no crackles, wheezes, or rhonchi Neuro: Grossly normal, moves all extremities Psych: Normal affect and thought content      [name] Good M. [name], MD 01/01/2023 2:59 PM

## 2023-01-01 NOTE — Patient Instructions (Signed)
It was very nice to see you today!  Please switch your Eliquis to Xarelto.  I hope this will help with the dizziness.  Will check blood work today.  Please follow-up with me next week to let me know how you are doing.  Return in about 1 week (around 01/08/2023).   Take care, Dr [name]  PLEASE NOTE:  If you had any lab tests, please let us know if you have not heard back within a few days. You may see your results on mychart before we have a chance to review them but we will give you a call once they are reviewed by [place].   If we ordered any referrals today, please let us know if you have not heard from their office within the next week.   If you had any urgent prescriptions sent in today, please check with the pharmacy within an hour of our visit to make sure the prescription was transmitted appropriately.   Please try these tips to maintain a healthy lifestyle:  Eat at least 3 REAL meals and 1-2 snacks per day.  Aim for no more than 5 hours between eating.  If you eat breakfast, please do so within one hour of getting up.   Each meal should contain half fruits/vegetables, one quarter protein, and one quarter carbs (no bigger than a computer mouse)  Cut down on sweet beverages. This includes juice, soda, and sweet tea.   Drink at least 1 glass of water with each meal and aim for at least 8 glasses per day  Exercise at least 150 minutes every week.

## 2023-01-01 NOTE — Assessment & Plan Note (Signed)
Referral to rheumatology is pending.

## 2023-01-01 NOTE — Assessment & Plan Note (Signed)
On Lipitor 80 mg daily and Zetia 10 mg daily.  Discussed that Zetia is unlikely to be causing her symptoms.  She will continue with her current regimen for now.

## 2023-01-01 NOTE — Assessment & Plan Note (Signed)
Anticoagulated on Eliquis 5 mg twice daily they will be switching to Xarelto 20 mg daily as above.  Will be checking labs today.  She does have a referral to hematology pending.  She will let us know if they have not contacted her by next week.

## 2023-01-04 ENCOUNTER — Other Ambulatory Visit (INDEPENDENT_AMBULATORY_CARE_PROVIDER_SITE_OTHER): Payer: Medicare HMO

## 2023-01-04 DIAGNOSIS — K55069 Acute infarction of intestine, part and extent unspecified: Secondary | ICD-10-CM | POA: Diagnosis not present

## 2023-01-05 ENCOUNTER — Telehealth: Payer: Self-pay | Admitting: Family Medicine

## 2023-01-05 LAB — COMPREHENSIVE METABOLIC PANEL
ALT: 25 U/L (ref 0–35)
AST: 33 U/L (ref 0–37)
Albumin: 3.3 g/dL — ABNORMAL LOW (ref 3.5–5.2)
Alkaline Phosphatase: 93 U/L (ref 39–117)
BUN: 15 mg/dL (ref 6–23)
CO2: 26 meq/L (ref 19–32)
Calcium: 8.9 mg/dL (ref 8.4–10.5)
Chloride: 104 meq/L (ref 96–112)
Creatinine, Ser: 0.81 mg/dL (ref 0.40–1.20)
GFR: 71.93 mL/min (ref >60.00)
Glucose, Bld: 94 mg/dL (ref 70–99)
Potassium: 4.2 meq/L (ref 3.5–5.1)
Sodium: 138 meq/L (ref 135–145)
Total Bilirubin: 0.4 mg/dL (ref 0.2–1.2)
Total Protein: 7.1 g/dL (ref 6.0–8.3)

## 2023-01-05 LAB — CBC
HCT: 38.3 % (ref 36.0–46.0)
Hemoglobin: 12.3 g/dL (ref 12.0–15.0)
MCHC: 32.2 g/dL (ref 30.0–36.0)
MCV: 95.3 fl (ref 78.0–100.0)
Platelets: 251 10*3/uL (ref 150.0–400.0)
RBC: 4.02 Mil/uL (ref 3.87–5.11)
RDW: 14.5 % (ref 11.5–15.5)
WBC: 7.2 10*3/uL (ref 4.0–10.5)

## 2023-01-05 NOTE — Telephone Encounter (Signed)
Pharmacy notified patient change Eliquis to Xarelto 20mg 

## 2023-01-05 NOTE — Progress Notes (Signed)
Labs are all normal.  Sodium is much better than the last several times we checked.  She should let us know how she is doing with the dizziness in a week or 2 as we discussed at her office visit.  Otherwise do not need to make any other changes to treatment plan at this time.

## 2023-01-05 NOTE — Telephone Encounter (Signed)
Katie from [employer] called wanting to clarify if patient is taking xarelto 20 mg since they also got a prescription order for Eliquis on 12/29/22. Florentina Addison can be called back for clarification @ [phone].

## 2023-01-12 ENCOUNTER — Telehealth: Payer: Self-pay | Admitting: Family Medicine

## 2023-01-12 NOTE — Telephone Encounter (Signed)
Patient state Xarelto samples did not work. States she is going to go back to [employer] (has a current RX)

## 2023-01-13 ENCOUNTER — Other Ambulatory Visit: Payer: Self-pay | Admitting: *Deleted

## 2023-01-13 MED ORDER — APIXABAN 5 MG PO TABS: ORAL_TABLET | ORAL | Status: DC

## 2023-01-13 NOTE — Telephone Encounter (Signed)
Patient aware, will continue taking Rx Eliquis

## 2023-01-13 NOTE — Telephone Encounter (Signed)
Ok for her to go back to eliquis. She should let us know if her dizziness is not improving.  [name] Good. [name], MD 01/13/2023 9:02 AM

## 2023-01-13 NOTE — Telephone Encounter (Signed)
Please advise 

## 2023-01-20 ENCOUNTER — Telehealth: Payer: Self-pay | Admitting: Family Medicine

## 2023-01-20 NOTE — Telephone Encounter (Signed)
Prescription Request  01/20/2023  LOV: 01/01/2023  What is the name of the medication or equipment? oxyCODONE-acetaminophen (PERCOCET) 10-325 MG tablet   Have you contacted your pharmacy to request a refill? Yes   Which pharmacy would you like this sent to?  [id number] - [place], [place] - [address] [place] [address] Phone: [phone] Fax: [phone]   Patient notified that their request is being sent to the clinical staff for review and that they should receive a response within 2 business days.   Please advise at Mobile [phone] (mobile)

## 2023-01-21 MED ORDER — OXYCODONE-ACETAMINOPHEN 10-325 MG PO TABS: ORAL_TABLET | ORAL | 0 refills | Status: DC

## 2023-01-27 DIAGNOSIS — R5383 Other fatigue: Secondary | ICD-10-CM | POA: Diagnosis not present

## 2023-01-27 DIAGNOSIS — R768 Other specified abnormal immunological findings in serum: Secondary | ICD-10-CM | POA: Diagnosis not present

## 2023-01-27 DIAGNOSIS — D519 Vitamin B12 deficiency anemia, unspecified: Secondary | ICD-10-CM | POA: Diagnosis not present

## 2023-01-27 DIAGNOSIS — M79672 Pain in left foot: Secondary | ICD-10-CM | POA: Diagnosis not present

## 2023-01-27 DIAGNOSIS — M79641 Pain in right hand: Secondary | ICD-10-CM | POA: Diagnosis not present

## 2023-01-27 DIAGNOSIS — M79671 Pain in right foot: Secondary | ICD-10-CM | POA: Diagnosis not present

## 2023-01-27 DIAGNOSIS — D8989 Other specified disorders involving the immune mechanism, not elsewhere classified: Secondary | ICD-10-CM | POA: Diagnosis not present

## 2023-01-27 DIAGNOSIS — E559 Vitamin D deficiency, unspecified: Secondary | ICD-10-CM | POA: Diagnosis not present

## 2023-01-27 DIAGNOSIS — M79642 Pain in left hand: Secondary | ICD-10-CM | POA: Diagnosis not present

## 2023-02-02 ENCOUNTER — Inpatient Hospital Stay: Payer: Medicare HMO | Admitting: Hematology and Oncology

## 2023-02-02 ENCOUNTER — Inpatient Hospital Stay: Payer: Medicare HMO | Attending: Hematology and Oncology

## 2023-02-02 VITALS — BP 135/70 | HR 96 | Temp 97.2°F | Resp 18 | Wt 204.6 lb

## 2023-02-02 DIAGNOSIS — Z8673 Personal history of transient ischemic attack (TIA), and cerebral infarction without residual deficits: Secondary | ICD-10-CM

## 2023-02-02 DIAGNOSIS — K55069 Acute infarction of intestine, part and extent unspecified: Secondary | ICD-10-CM | POA: Insufficient documentation

## 2023-02-02 DIAGNOSIS — I252 Old myocardial infarction: Secondary | ICD-10-CM | POA: Diagnosis not present

## 2023-02-02 DIAGNOSIS — Z7901 Long term (current) use of anticoagulants: Secondary | ICD-10-CM | POA: Insufficient documentation

## 2023-02-02 DIAGNOSIS — Z87891 Personal history of nicotine dependence: Secondary | ICD-10-CM | POA: Insufficient documentation

## 2023-02-02 LAB — ANTITHROMBIN III: AntiThromb III Func: 86 % (ref 75–120)

## 2023-02-02 NOTE — Progress Notes (Unsigned)
[facility] CONSULT NOTE  Patient Care Team: Ardith Dark, MD as PCP - General (Family Medicine) Corky Crafts, MD as PCP - Cardiology (Cardiology)  CHIEF COMPLAINTS/PURPOSE OF CONSULTATION:  SMV thrombus  HISTORY OF PRESENTING ILLNESS:  [name] Good 72 y.o. female is here because of recent diagnosis of  SMV thrombus  This is a very pleasant 72 year old female patient with past medical history significant for GERD, myocardial infarction, stroke referred to hematology for evaluation and recommendations regarding recently diagnosed superior mesenteric venous thrombosis.  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  She most recently was started on [facility] for weight loss and started having severe abdominal pain which prompted her visit to the emergency room.  She had a CT abdomen pelvis with contrast which showed thrombosed SMV and first-order tributaries main portal vein, proximal left and proximal right portal veins and proximal splenic vein thrombosis.  Eccentric edema primarily in the root fat extending into the lower abdomen and ileocolic mesentery.  Moderate hepatic steatosis with lobular hepatic contour which may be seen in various stages of cirrhosis.  No evidence of intra-abdominal malignancy.  She is now back on anticoagulation.  She also has an underlying ? autoimmune arthritis for which she was treated by rheumatology, now goes to see rheumatology at [facility] rheumatology, awaiting a follow-up.  Her daughter was present at the time of her visit today.  She denies any known family history of hypercoagulable disorders, her daughter also had some clotting issues but at the same time also has autoimmune issues.  Her abdominal pain has currently resolved.  She is feeling much better.  Rest of the pertinent 10 point ROS reviewed and negative  MEDICAL HISTORY:  Past Medical History:  Diagnosis Date    Anxiety    Arthritis    Depression    GERD (gastroesophageal reflux disease)    Myocardial infarction (HCC) 89   no visits to cardiac dr([name] kelly) since 89   Stroke (HCC)    Wound dehiscence    lumbar    SURGICAL HISTORY: Past Surgical History:  Procedure Laterality Date   APPENDECTOMY     17 yrs   BACK SURGERY     BREAST SURGERY Left    cyst   BUBBLE STUDY  03/12/2020   Procedure: BUBBLE STUDY;  Surgeon: Orpah Cobb, MD;  Location: [facility] ENDOSCOPY;  Service: Cardiovascular;;   CARDIAC CATHETERIZATION  55   CHOLECYSTECTOMY     72 yrs old   LUMBAR LAMINECTOMY/DECOMPRESSION MICRODISCECTOMY N/A 06/14/2015   Procedure: Lumbar Three-Four,Lumbar Four-Five, Lumbar Five-Sacral One Laminectomy;  Surgeon: Tia Alert, MD;  Location: [facility] NEURO ORS;  Service: Neurosurgery;  Laterality: N/A;   LUMBAR WOUND DEBRIDEMENT N/A 07/24/2015   Procedure: lumbar wound revision;  Surgeon: Tia Alert, MD;  Location: [facility] NEURO ORS;  Service: Neurosurgery;  Laterality: N/A;   TEE WITHOUT CARDIOVERSION N/A 03/12/2020   Procedure: TRANSESOPHAGEAL ECHOCARDIOGRAM (TEE);  Surgeon: Orpah Cobb, MD;  Location: [facility] ENDOSCOPY;  Service: Cardiovascular;  Laterality: N/A;   TOTAL HIP ARTHROPLASTY Right 10/25/2015   Procedure: RIGHT TOTAL HIP ARTHROPLASTY ANTERIOR APPROACH;  Surgeon: Kathryne Hitch, MD;  Location: [facility] ORS;  Service: Orthopedics;  Laterality: Right;   TUBAL LIGATION      SOCIAL HISTORY: Social History   Socioeconomic History   Marital status: Married    Spouse name: Mariadelosang Wynns   Number of children: 4   Years of education: GED  Highest education level: GED or equivalent  Occupational History   Occupation: retired   Tobacco Use   Smoking status: Former    Current packs/day: 0.00    Average packs/day: 2.0 packs/day for 40.0 years (80.0 ttl pk-yrs)    Types: Cigarettes    Start date: 04/10/1973    Quit date: 04/10/2013    Years since quitting: 9.8   Smokeless tobacco: Never   Vaping Use   Vaping status: Former   Start date: 04/11/2013   Substances: Nicotine, Nicotine-salt  Substance and Sexual Activity   Alcohol use: Not Currently    Comment: rarely   Drug use: No   Sexual activity: Not Currently  Other Topics Concern   Not on file  Social History Narrative   Lives with husband   Right Handed   Drinks1-2 cups caffeine daily   Social Determinants of Health   Financial Resource Strain: Low Risk  (11/05/2022)   Overall Financial Resource Strain (CARDIA)    Difficulty of Paying Living Expenses: Not hard at all  Food Insecurity: No Food Insecurity (11/05/2022)   Hunger Vital Sign    Worried About Running Out of Food in the Last Year: Never true    Ran Out of Food in the Last Year: Never true  Transportation Needs: No Transportation Needs (11/05/2022)   PRAPARE - Administrator, Civil Service (Medical): No    Lack of Transportation (Non-Medical): No  Physical Activity: Inactive (11/05/2022)   Exercise Vital Sign    Days of Exercise per Week: 0 days    Minutes of Exercise per Session: 0 min  Stress: No Stress Concern Present (11/05/2022)   [employer] of Occupational Health - Occupational Stress Questionnaire    Feeling of Stress : Only a little  Social Connections: Moderately Isolated (11/05/2022)   Social Connection and Isolation Panel [NHANES]    Frequency of Communication with Friends and Family: More than three times a week    Frequency of Social Gatherings with Friends and Family: Once a week    Attends Religious Services: Never    Database administrator or Organizations: No    Attends Banker Meetings: Never    Marital Status: Married  Catering manager Violence: Not At Risk (11/05/2022)   Humiliation, Afraid, Rape, and Kick questionnaire    Fear of Current or Ex-Partner: No    Emotionally Abused: No    Physically Abused: No    Sexually Abused: No    FAMILY HISTORY: Family History  Problem Relation Age of Onset    Stroke Mother    Clotting disorder Neg Hx     ALLERGIES:  is allergic to amoxicillin.  MEDICATIONS:  Current Outpatient Medications  Medication Sig Dispense Refill   apixaban (ELIQUIS) 5 MG TABS tablet Take 2 tablets (10 mg total) by mouth 2 (two) times daily for 5 days, THEN 1 tablet (5 mg total) 2 (two) times daily.     atorvastatin (LIPITOR) 80 MG tablet Take 1 tablet (80 mg total) by mouth daily. 90 tablet 3   baclofen (LIORESAL) 10 MG tablet TAKE 1 TABLET BY MOUTH 3 TIMES DAILY 30 tablet 5   calcium carbonate (TUMS EX) 750 MG chewable tablet Chew 1 tablet by mouth 2 (two) times daily as needed for heartburn.     clopidogrel (PLAVIX) 75 MG tablet Take 1 tablet (75 mg total) by mouth daily. Please call [phone] to schedule an overdue appointment for future refills. Thank you. 30 tablet 0  diphenhydrAMINE (BENADRYL) 25 mg capsule Take 25 mg by mouth every 6 (six) hours as needed for sleep or allergies.     docusate sodium (COLACE) 100 MG capsule Take 100 mg by mouth 2 (two) times daily.     escitalopram (LEXAPRO) 20 MG tablet Take 1 tablet (20 mg total) by mouth daily. 90 tablet 3   ezetimibe (ZETIA) 10 MG tablet Take 1 tablet (10 mg total) by mouth daily. 30 tablet 3   famotidine (PEPCID) 20 MG tablet Take 20 mg by mouth daily as needed for heartburn or indigestion.     ondansetron (ZOFRAN) 4 MG tablet Take 1 tablet (4 mg total) by mouth every 8 (eight) hours as needed for nausea or vomiting. 20 tablet 0   oxyCODONE-acetaminophen (PERCOCET) 10-325 MG tablet TAKE 1 TABLET BY MOUTH 5 TIMES DAILY 150 tablet 0   predniSONE (DELTASONE) 5 MG tablet TAKE 1 TABLET BY MOUTH DAILY 90 tablet 3   rOPINIRole (REQUIP) 3 MG tablet TAKE 1 TABLET BY MOUTH AT BEDTIME AS NEEDED (Patient taking differently: Take 3 mg by mouth at bedtime.) 90 tablet 3   No current facility-administered medications for this visit.    REVIEW OF SYSTEMS:   Constitutional: Denies fevers, chills or abnormal night  sweats Eyes: Denies blurriness of vision, double vision or watery eyes Ears, nose, mouth, throat, and face: Denies mucositis or sore throat Respiratory: Denies cough, dyspnea or wheezes Cardiovascular: Denies palpitation, chest discomfort or lower extremity swelling Gastrointestinal:  Denies nausea, heartburn or change in bowel habits Skin: Denies abnormal skin rashes Lymphatics: Denies new lymphadenopathy or easy bruising Neurological:Denies numbness, tingling or new weaknesses Behavioral/Psych: Mood is stable, no new changes  Breast: Denies any palpable lumps or discharge All other systems were reviewed with the patient and are negative.  PHYSICAL EXAMINATION: ECOG PERFORMANCE STATUS: 0 - Asymptomatic  Vitals:   02/02/23 1406  BP: 135/70  Pulse: 96  Resp: 18  Temp: (!) 97.2 F (36.2 C)  SpO2: 100%   Filed Weights   02/02/23 1406  Weight: 204 lb 9.6 oz (92.8 kg)    GENERAL:alert, no distress and comfortable SKIN: skin color, texture, turgor are normal, no rashes or significant lesions EYES: normal, conjunctiva are pink and non-injected, sclera clear OROPHARYNX:no exudate, no erythema and lips, buccal mucosa, and tongue normal  NECK: supple, thyroid normal size, non-tender, without nodularity LYMPH:  no palpable lymphadenopathy in the cervical, axillary  LUNGS: clear to auscultation and percussion with normal breathing effort HEART: regular rate & rhythm and no murmurs and no lower extremity edema ABDOMEN: Examined in sitting position, soft, nontender, nondistended Musculoskeletal:no cyanosis of digits and no clubbing  PSYCH: alert & oriented x 3 with fluent speech NEURO: no focal motor/sensory deficits   LABORATORY DATA:  I have reviewed the data as listed Lab Results  Component Value Date   WBC 7.2 01/04/2023   HGB 12.3 01/04/2023   HCT 38.3 01/04/2023   MCV 95.3 01/04/2023   PLT 251.0 01/04/2023   Lab Results  Component Value Date   NA 138 01/04/2023   K  4.2 01/04/2023   CL 104 01/04/2023   CO2 26 01/04/2023    RADIOGRAPHIC STUDIES: I have personally reviewed the radiological reports and agreed with the findings in the report.  ASSESSMENT AND PLAN:   This is a very pleasant 72 year old female patient with newly diagnosed SMV thrombus, history of left ventricular apical thrombus now back on anticoagulation with Eliquis referred to hematology for additional recommendations.  She  denies any other provoking factors except for recent initiation of Mounjaro for weight loss.  She had some labs drawn while she was in the hospital including Antithrombin III levels, factor V Leiden, protein S activity, C activity and antiphospholipid syndrome.  She had persistent elevation of antiphospholipid cardiolipin IgM antibodies on at least 2 occasions 6 years apart.  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  I explained the diagnosis in detail.  I have also ordered repeat hypercoagulable workup for Antithrombin, protein C, protein S and C's were mildly abnormal, added beta-2 glycoprotein antibodies and prothrombin gene mutation as well.  At this time since this is her second episode of VTE, I would recommend indefinite anticoagulation.  If this was indeed antiphospholipid antibody syndrome, we have discussed warfarin is considered superior to DOAC's in the setting, target INR should be 2-3.  She wants to follow-up with her PCP to have the warfarin monitoring.  Her CBC was entirely normal, no evidence of polycythemia or pancytopenia hence have not ordered a JAK2 mutation or PNH panel today.  She is agreeable to lifelong anticoagulation.  At this time we have offered her follow-up in 1 year or sooner as needed.  We will convey the information to her PCP as well regarding the anticoagulation  recommendations.  Thank you for consulting [place] in the care of this patient.  Please not hesitate to contact us with any additional questions or concerns.  All questions were answered. The patient knows to call the clinic with any problems, questions or concerns.    Rachel Moulds, MD 02/02/23

## 2023-02-03 LAB — PROTEIN S ACTIVITY: Protein S Activity: 92 % (ref 63–140)

## 2023-02-03 LAB — PROTEIN C ACTIVITY: Protein C Activity: 81 % (ref 73–180)

## 2023-02-03 LAB — BETA-2-GLYCOPROTEIN I ABS, IGG/M/A
Beta-2 Glyco I IgG: <9 GPI IgG units (ref 0–20)
Beta-2-Glycoprotein I IgA: <9 GPI IgA units (ref 0–25)
Beta-2-Glycoprotein I IgM: <9 GPI IgM units (ref 0–32)

## 2023-02-04 ENCOUNTER — Telehealth: Payer: Self-pay | Admitting: *Deleted

## 2023-02-04 NOTE — Telephone Encounter (Signed)
Please have patient set up appointment with our warfarin clinic

## 2023-02-04 NOTE — Telephone Encounter (Signed)
This RN called to Dr Lavone Neri per consult with MD and need for lifelong anti coag therapy with pt requesting for primary MD to prescribe and manage.  Per call pt is scheduled to see Dr [name] 02/11/2023.  Records will be faxed as well as this note forwarded for communication.

## 2023-02-04 NOTE — Telephone Encounter (Signed)
Please have patient set up appointment with our warfarin clinic.  [name] Good. [name], MD 02/04/2023 10:10 AM

## 2023-02-05 NOTE — Telephone Encounter (Signed)
Please have patient set up appointment with our warfarin clinic

## 2023-02-05 NOTE — Telephone Encounter (Signed)
If patient needing an OV with coumadin clinic, will she still need OV with pcp that's scheduled for 10/17? Please advise.

## 2023-02-09 LAB — PROTHROMBIN GENE MUTATION

## 2023-02-11 ENCOUNTER — Ambulatory Visit: Payer: Medicare HMO | Admitting: Family Medicine

## 2023-02-12 DIAGNOSIS — D519 Vitamin B12 deficiency anemia, unspecified: Secondary | ICD-10-CM | POA: Diagnosis not present

## 2023-02-12 DIAGNOSIS — M79672 Pain in left foot: Secondary | ICD-10-CM | POA: Diagnosis not present

## 2023-02-12 DIAGNOSIS — M79671 Pain in right foot: Secondary | ICD-10-CM | POA: Diagnosis not present

## 2023-02-12 DIAGNOSIS — M79642 Pain in left hand: Secondary | ICD-10-CM | POA: Diagnosis not present

## 2023-02-12 DIAGNOSIS — M79641 Pain in right hand: Secondary | ICD-10-CM | POA: Diagnosis not present

## 2023-02-12 DIAGNOSIS — R5383 Other fatigue: Secondary | ICD-10-CM | POA: Diagnosis not present

## 2023-02-12 DIAGNOSIS — E559 Vitamin D deficiency, unspecified: Secondary | ICD-10-CM | POA: Diagnosis not present

## 2023-02-12 DIAGNOSIS — R768 Other specified abnormal immunological findings in serum: Secondary | ICD-10-CM | POA: Diagnosis not present

## 2023-02-12 DIAGNOSIS — D8989 Other specified disorders involving the immune mechanism, not elsewhere classified: Secondary | ICD-10-CM | POA: Diagnosis not present

## 2023-02-16 ENCOUNTER — Encounter: Payer: Self-pay | Admitting: Family Medicine

## 2023-02-16 ENCOUNTER — Ambulatory Visit: Payer: Medicare HMO | Admitting: Family Medicine

## 2023-02-16 VITALS — BP 114/64 | HR 93 | Temp 98.2°F | Ht 62.0 in | Wt 203.4 lb

## 2023-02-16 DIAGNOSIS — E559 Vitamin D deficiency, unspecified: Secondary | ICD-10-CM

## 2023-02-16 DIAGNOSIS — K55069 Acute infarction of intestine, part and extent unspecified: Secondary | ICD-10-CM

## 2023-02-16 DIAGNOSIS — G894 Chronic pain syndrome: Secondary | ICD-10-CM

## 2023-02-16 DIAGNOSIS — M1990 Unspecified osteoarthritis, unspecified site: Secondary | ICD-10-CM

## 2023-02-16 DIAGNOSIS — E1169 Type 2 diabetes mellitus with other specified complication: Secondary | ICD-10-CM | POA: Diagnosis not present

## 2023-02-16 DIAGNOSIS — M359 Systemic involvement of connective tissue, unspecified: Secondary | ICD-10-CM

## 2023-02-16 LAB — POCT GLYCOSYLATED HEMOGLOBIN (HGB A1C): Hemoglobin A1C: 5.4 % (ref 4.0–5.6)

## 2023-02-16 MED ORDER — AZITHROMYCIN 250 MG PO TABS: ORAL_TABLET | ORAL | 0 refills | Status: DC

## 2023-02-16 NOTE — Assessment & Plan Note (Signed)
Had lengthy discussion with patient and her daughter today regarding her anticoagulation.  She is seeing hematology for this and they have recommended indefinite anticoagulation with warfarin due to concern for lupus antiphospholipid syndrome.  She will come back soon to transition from her Eliquis to warfarin.  She does note that it is difficult for her to come to this office frequently as we are about 40-minute drive away.  She will look to see if there are any other locations around her house that may manage her warfarin.

## 2023-02-16 NOTE — Assessment & Plan Note (Signed)
Following with orthopedics though has not seen them in quite a while.  She does have a history of right hip replacement has been having more progressive pain in her knees as well.  She has discussed this with them in the past and they have recommended knee replacement surgery however she is postponing this for now.  She will continue management via pain medications for now and she will let us know if she needs a referral.

## 2023-02-16 NOTE — Patient Instructions (Addendum)
It was very nice to see you today!  Please start the azithromycin.  Please come back in a week or 2 to see the warfarin clinic to get started on warfarin.  Your A1c looks great today.  We will see back in 3 months.  Come back sooner if needed.  Please let me know if you need a referral to see the orthopedist.   Return in about 3 months (around 05/19/2023).   Take care, Dr [name]  PLEASE NOTE:  If you had any lab tests, please let us know if you have not heard back within a few days. You may see your results on mychart before we have a chance to review them but we will give you a call once they are reviewed by [place].   If we ordered any referrals today, please let us know if you have not heard from their office within the next week.   If you had any urgent prescriptions sent in today, please check with the pharmacy within an hour of our visit to make sure the prescription was transmitted appropriately.   Please try these tips to maintain a healthy lifestyle:  Eat at least 3 REAL meals and 1-2 snacks per day.  Aim for no more than 5 hours between eating.  If you eat breakfast, please do so within one hour of getting up.   Each meal should contain half fruits/vegetables, one quarter protein, and one quarter carbs (no bigger than a computer mouse)  Cut down on sweet beverages. This includes juice, soda, and sweet tea.   Drink at least 1 glass of water with each meal and aim for at least 8 glasses per day  Exercise at least 150 minutes every week.

## 2023-02-16 NOTE — Progress Notes (Signed)
[name] Good is a 72 y.o. female who presents today for an office visit.  Assessment/Plan:  New/Acute Problems: Cough Overall reassuring exam today however given the length of symptoms would be reasonable for [place] to start antibiotics at this point.  She does have a history of smoking and may have mild COPD as well.  No signs of respiratory distress or systemic infection today.  Will start azithromycin.  Encouraged hydration.  She will let us know if not improving or if this becomes a recurrent issue and would consider referral to pulmonology for PFTs or imaging at that time.  We discussed reasons to return to care or seek emergent care.  Chronic Problems Addressed Today: T2DM (type 2 diabetes mellitus) (HCC) A1c is at goal today off meds.  Recheck 3 to 6 months.  Mesenteric thrombosis (HCC) Had lengthy discussion with patient and her daughter today regarding her anticoagulation.  She is seeing hematology for this and they have recommended indefinite anticoagulation with warfarin due to concern for lupus antiphospholipid syndrome.  She will come back soon to transition from her Eliquis to warfarin.  She does note that it is difficult for her to come to this office frequently as we are about 40-minute drive away.  She will look to see if there are any other locations around her house that may manage her warfarin.  Autoimmune disease (HCC) Continue management per rheumatology.  Vitamin D deficiency Started on vitamin D 50,000 IUs weekly per rheumatology.  She will be having labs drawn again in a couple of months.  Chronic pain syndrome On oxycodone 10-325 5 times daily as needed.  Medications help with pain and ability perform activities of daily living.  She is having a bit more pain in her knees as below.  Does not need refill today.  She is tolerating medications well without any side effects.  Follow-up in 3 months.  Arthritis Following with orthopedics though has not seen them in quite  a while.  She does have a history of right hip replacement has been having more progressive pain in her knees as well.  She has discussed this with them in the past and they have recommended knee replacement surgery however she is postponing this for now.  She will continue management via pain medications for now and she will let us know if she needs a referral.      Subjective:  HPI:  See Assessment / plan for status of chronic conditions.  Patient here today for follow-up.  Last saw her a couple of months ago.  At that time she was having ongoing issues with intermittent dizziness.  She thought this may be due to her Eliquis.  We had tried switching her to Xarelto to see if this improves her symptoms however symptoms persisted and she went back to Eliquis.  She saw hematology a couple weeks ago due to her recurrent issues with VTE. There was discussion about need for indefinite anticoagulation.  Due to concern for antiphospholipid antibody syndrome they had recommended continuing anticoagulation with warfarin instead of DOAC.  She has not yet transition of this.  Her oncologist would like for [place] to take over management for her anticoagulation.  She is also send rheumatologist a couple times since her last visit.  She still has an undiagnosed autoimmune disorder that may be concerned about potential lupus diagnosis.  She has not been started on any specific medication for this however was found to have low vitamin D and was  started on vitamin D supplementation by her rheumatologist.  She has been having ongoing cough and congestion for the last week or 2.  Symptoms seem to be getting worse.  Having more congested in her chest.  No shortness of breath.  Some chills.  No fevers.  No chest pain.        Objective:  Physical Exam: BP 114/64   Pulse 93   Temp 98.2 F (36.8 C) (Temporal)   Ht 5\' 2"  (1.575 m)   Wt 203 lb 6.4 oz (92.3 kg)   SpO2 94%   BMI 37.20 kg/m   Gen: No acute distress,  resting comfortably CV: Regular rate and rhythm with no murmurs appreciated Pulm: Normal work of breathing, occasional coarse rhonchi.  No wheezing. Neuro: Grossly normal, moves all extremities Psych: Normal affect and thought content      Shatana Saxton M. [name], MD 02/16/2023 1:51 PM

## 2023-02-16 NOTE — Assessment & Plan Note (Signed)
A1c is at goal today off meds.  Recheck 3 to 6 months.

## 2023-02-16 NOTE — Assessment & Plan Note (Signed)
Started on vitamin D 50,000 IUs weekly per rheumatology.  She will be having labs drawn again in a couple of months.

## 2023-02-16 NOTE — Assessment & Plan Note (Signed)
Continue management per rheumatology. 

## 2023-02-16 NOTE — Assessment & Plan Note (Signed)
On oxycodone 10-325 5 times daily as needed.  Medications help with pain and ability perform activities of daily living.  She is having a bit more pain in her knees as below.  Does not need refill today.  She is tolerating medications well without any side effects.  Follow-up in 3 months.

## 2023-02-18 ENCOUNTER — Other Ambulatory Visit: Payer: Self-pay | Admitting: Family Medicine

## 2023-02-18 MED ORDER — OXYCODONE-ACETAMINOPHEN 10-325 MG PO TABS: ORAL_TABLET | ORAL | 0 refills | Status: DC

## 2023-02-18 NOTE — Telephone Encounter (Signed)
Prescription Request  02/18/2023  LOV: 02/16/2023  What is the name of the medication or equipment? oxyCODONE-acetaminophen (PERCOCET) 10-325 MG tablet   Have you contacted your pharmacy to request a refill? Yes   Which pharmacy would you like this sent to?  [id number] - [place], [place] - [address] [place] [address] Phone: [phone] Fax: [phone]     Patient notified that their request is being sent to the clinical staff for review and that they should receive a response within 2 business days.   Please advise at Mobile [phone] (mobile)

## 2023-02-24 ENCOUNTER — Telehealth: Payer: Self-pay

## 2023-02-24 DIAGNOSIS — Z7901 Long term (current) use of anticoagulants: Secondary | ICD-10-CM

## 2023-02-24 MED ORDER — WARFARIN SODIUM 5 MG PO TABS: ORAL_TABLET | ORAL | 0 refills | Status: DC

## 2023-02-24 MED ORDER — ENOXAPARIN SODIUM 150 MG/ML IJ SOSY: 150.0000 mg | PREFILLED_SYRINGE | INTRAMUSCULAR | 0 refills | Status: DC

## 2023-02-24 NOTE — Telephone Encounter (Signed)
Pt referred to coumadin clinic for transition from Eliquis to warfarin and warfarin management.  Pt is currently taking Eliquis and is on the coumadin clinic schedule for next week, 11/6, for discussion of transition. Best practice for transitioning from Eliquis to warfarin is to stop Eliquis and start warfarin along with Lovenox injections until pt has a therapeutic INR. Pt's suggested INR range is 2.0-3.0. (Eliquis can falsely elevate INR when taken with warfarin) Unsure if pt is able to administered Lovenox injections or if family could help with the injections.  Recommended Lovenox injections of 150 mg daily in the AM.  Actual Wt: 92 kg Ideal Wt: 51 kg (180% > than actual; if ? 125% > then adjusted wt should be used for CrCl calculation) Adjusted Wt: 67 kg CrCl: 65.43 mL/min (using adjusted wt)  If CrCl is >30 mL/min pt can [place] 1.5 mg/kg Lovenox once daily.   Would like to start pt on Lovenox and warfarin as soon as possible. If able to start this week the first INR can be completed next week at coumadin clinic, scheduled for 11/6. Also recommend starting dose for warfarin of 5 mg once daily.

## 2023-02-24 NOTE — Telephone Encounter (Signed)
Contacted pt by phone and a full discussion of the nature of anticoagulants has been carried out. The need for frequent and regular monitoring, precise dosage adjustment and compliance was stressed.  Side effects of potential bleeding are discussed.  The patient should avoid any OTC items containing aspirin or ibuprofen, and should avoid great swings in general diet.  Advised to call if any signs of abnormal bleeding.   Advised pt of need for Lovenox injections until warfarin is therapeutic. Pt reports her daughter can help with the injections. She would like scripts for warfarin and Lovenox sent to [facility]. If she has any problem with cost of getting the Lovenox at [facility] she will contact the coumadin clinic. She is to contact the coumadin clinic when she does receive both scripts so precise instructions can be give for staring warfarin and Lovenox and stopping Eliquis. Pt wrote all instructions and read back for verification. Pt reports she will tell her daughter of the discussion and if she has any questions she will give her daughter the number to the coumadin clinic.  Pt reports she would like to continue her coumadin clinic apts at [facility]. Reminded pt of apt for next Wednesday. Answered all questions and advised if any further questions to contact coumadin clinic. Advised a mychart msg would be sent with some of the information discusses with her today. Pt verbalized understanding.  Sent in scripts for Lovenox and warfarin to pharmacy of choice.

## 2023-02-25 ENCOUNTER — Telehealth: Payer: Self-pay | Admitting: Family Medicine

## 2023-02-25 NOTE — Telephone Encounter (Signed)
Documentation below from other telephone encounter from today concerning Eliquis and warfarin.   Pt reports she is not going to transition from Eliquis to warfarin. Pt reports she is not able to make all of those trips to the clinic and it is making her very stressed. She also reports the Lovenox injections are going to cost $90 and she does not have that. She reports she has discussed this with her family and right now she would like to remain on Eliquis, stating, "I guess I will take my chances." She reported her family agrees with this course of action. Advised pt why she needed to transition to warfarin and that Eliquis is not as effective at preventing clots with her diagnosis. Pt requested explanations of why warfarin will work and Eliquis will not. Educated pt on mechanism of action of the medications and her diagnosis.  Advised if she could not afford the injections we could try to transition her to warfarin in a different way. She stated, "right now I am just going to stay on Eliquis."  Pt has not stopped taking Eliquis and has not picked up the script for warfarin or Lovenox. Advised pt to continue Eliquis unless instructed to stop.  Pt reported she tried to contact the coumadin clinic this morning but the number did not work. Verification of number revealed pt had written the wrong number down. Gave pt the number to the coumadin clinic and she read it back. Advised if any further questions or her family had any questions concerning her plan of care to feel free to contact the coumadin clinic. Pt verbalized understanding and was appreciative of the call.

## 2023-02-25 NOTE — Telephone Encounter (Signed)
Noted, coumadin clinic apt has been cancelled.

## 2023-02-25 NOTE — Telephone Encounter (Signed)
Patient requests to be called at ph# [phone] to discuss RX for Warfarin and Coumadin Clinic

## 2023-02-25 NOTE — Telephone Encounter (Signed)
Noted. We can discuss more at her next appointment.

## 2023-02-25 NOTE — Telephone Encounter (Signed)
Pt reports she is not going to transition from Eliquis to warfarin. Pt reports she is not able to make all of those trips to the clinic and it is making her very stressed. She also reports the Lovenox injections are going to cost $90 and she does not have that. She reports she has discussed this with her family and right now she would like to remain on Eliquis, stating, "I guess I will take my chances." She reported her family agrees with this course of action. Advised pt why she needed to transition to warfarin and that Eliquis is not as effective at preventing clots with her diagnosis. Pt requested explanations of why warfarin will work and Eliquis will not. Educated pt on mechanism of action of the medications and her diagnosis.  Advised if she could not afford the injections we could try to transition her to warfarin in a different way. She stated, "right now I am just going to stay on Eliquis."  Pt has not stopped taking Eliquis and has not picked up the script for warfarin or Lovenox. Advised pt to continue Eliquis unless instructed to stop.  Pt reported she tried to contact the coumadin clinic this morning but the number did not work. Verification of number revealed pt had written the wrong number down. Gave pt the number to the coumadin clinic and she read it back. Advised if any further questions or her family had any questions concerning her plan of care to feel free to contact the coumadin clinic. Pt verbalized understanding and was appreciative of the call.

## 2023-03-03 ENCOUNTER — Ambulatory Visit: Payer: Medicare HMO

## 2023-03-04 ENCOUNTER — Other Ambulatory Visit: Payer: Self-pay | Admitting: Interventional Cardiology

## 2023-03-18 ENCOUNTER — Other Ambulatory Visit: Payer: Self-pay | Admitting: Family Medicine

## 2023-03-18 NOTE — Telephone Encounter (Signed)
Prescription Request  03/18/2023  LOV: 02/16/2023  What is the name of the medication or equipment? oxyCODONE-acetaminophen (PERCOCET) 10-325 MG tablet   Have you contacted your pharmacy to request a refill? Yes   Which pharmacy would you like this sent to?  [id number] - [place], [place] - [address] [place] [address] Phone: [phone] Fax: [phone]     Patient notified that their request is being sent to the clinical staff for review and that they should receive a response within 2 business days.   Please advise at Mobile [phone] (mobile)

## 2023-03-19 MED ORDER — OXYCODONE-ACETAMINOPHEN 10-325 MG PO TABS: ORAL_TABLET | ORAL | 0 refills | Status: DC

## 2023-03-22 ENCOUNTER — Telehealth: Payer: Self-pay

## 2023-03-22 NOTE — Patient Outreach (Signed)
Attempted to contact patient regarding care gaps. Left voicemail for patient to return my call at [phone].  Nicholes Rough, CMA Care Guide [employer]

## 2023-03-23 ENCOUNTER — Other Ambulatory Visit: Payer: Self-pay | Admitting: Family Medicine

## 2023-03-26 ENCOUNTER — Other Ambulatory Visit: Payer: Self-pay | Admitting: Family Medicine

## 2023-03-29 ENCOUNTER — Other Ambulatory Visit: Payer: Self-pay | Admitting: Family Medicine

## 2023-03-29 ENCOUNTER — Other Ambulatory Visit: Payer: Self-pay | Admitting: Physician Assistant

## 2023-03-30 ENCOUNTER — Other Ambulatory Visit: Payer: Self-pay | Admitting: *Deleted

## 2023-03-30 ENCOUNTER — Other Ambulatory Visit: Payer: Self-pay | Admitting: Family Medicine

## 2023-03-30 NOTE — Telephone Encounter (Signed)
Please let patient know insurance will not pay for this. She can call for alternatives if she wishes.  [name]. [name], MD 03/30/2023 3:10 PM

## 2023-03-30 NOTE — Telephone Encounter (Signed)
Please advise 

## 2023-03-30 NOTE — Telephone Encounter (Signed)
Patient called re: Denial of Phentermine

## 2023-03-31 NOTE — Telephone Encounter (Signed)
Left message to return call to our office at their convenience.  

## 2023-04-01 ENCOUNTER — Other Ambulatory Visit: Payer: Self-pay | Admitting: Family Medicine

## 2023-04-05 MED ORDER — PHENTERMINE HCL 37.5 MG PO CAPS: 37.5000 mg | ORAL_CAPSULE | ORAL | 2 refills | Status: DC

## 2023-04-05 NOTE — Telephone Encounter (Signed)
Pt called to request refill of phentermine. I informed pt of PCP message below but she is still requesting pcp send in medication. States OOP cost shouldn't be more than $20. Please Advise.

## 2023-04-05 NOTE — Telephone Encounter (Signed)
See note

## 2023-04-06 NOTE — Telephone Encounter (Signed)
Pt called for status of med refill. I informed her it was sent in to [facility] yesterday. Pt states this was meant to be sent in to [facility].

## 2023-04-06 NOTE — Telephone Encounter (Signed)
Called pt ans lvm advising Rx was sent to [facility] 04/05/23 and PCP out of office today. This can be corrected when in office tomorrow morning however if patient picking up from [facility] today please let office know.

## 2023-04-07 ENCOUNTER — Other Ambulatory Visit: Payer: Self-pay | Admitting: *Deleted

## 2023-04-07 NOTE — Telephone Encounter (Signed)
Do we still need to send in the [facility]?  [name]. [name], MD 04/07/2023 10:54 AM

## 2023-04-07 NOTE — Addendum Note (Signed)
Addended by: Dyann Kief on: 04/07/2023 12:58 PM   Modules accepted: Orders

## 2023-04-08 MED ORDER — PHENTERMINE HCL 37.5 MG PO CAPS: 37.5000 mg | ORAL_CAPSULE | ORAL | 2 refills | Status: DC

## 2023-04-08 NOTE — Telephone Encounter (Signed)
Patient called back clarifying this needs to be sent to the [facility] pharmacy.

## 2023-04-09 NOTE — Telephone Encounter (Signed)
Rx was corrected and sent to correct pharmacy 04/08/23

## 2023-04-14 ENCOUNTER — Other Ambulatory Visit: Payer: Self-pay | Admitting: Family Medicine

## 2023-04-14 NOTE — Telephone Encounter (Signed)
Copied from CRM [id number]. Topic: Clinical - Medication Refill >> Apr 14, 2023 11:34 AM Drema Balzarine wrote: Most Recent Primary Care Visit:  Provider: Ardith Dark  Department: [facility]  Visit Type: OFFICE VISIT  Date: 02/16/2023  Medication: ***  Has the patient contacted their pharmacy?  (Agent: If no, request that the patient contact the pharmacy for the refill. If patient does not wish to contact the pharmacy document the reason why and proceed with request.) (Agent: If yes, when and what did the pharmacy advise?)  Is this the correct pharmacy for this prescription?  If no, delete pharmacy and type the correct one.  This is the patient's preferred pharmacy:  [facility] [id number], [place] - [address] [place] [address] Phone: [phone] Fax: [phone]  [facility] - [place], [place] - [address] [place] [address] Phone: [phone] Fax: [phone]   Has the prescription been filled recently?   Is the patient out of the medication?   Has the patient been seen for an appointment in the last year OR does the patient have an upcoming appointment?   Can we respond through MyChart?   Agent: Please be advised that Rx refills may take up to 3 business days. We ask that you follow-up with your pharmacy.

## 2023-04-14 NOTE — Telephone Encounter (Signed)
Copied from CRM [id number]. Topic: Clinical - Medication Refill >> Apr 14, 2023 11:23 AM Drema Balzarine wrote: Most Recent Primary Care Visit:  Provider: Ardith Dark  Department: [facility]  Visit Type: OFFICE VISIT  Date: 02/16/2023  Medication: oxyCODONE-acetaminophen (PERCOCET) 10-325 MG tablet [[id number]]  Has the patient contacted their pharmacy? No (Agent: If no, request that the patient contact the pharmacy for the refill. If patient does not wish to contact the pharmacy document the reason why and proceed with request.) (Agent: If yes, when and what did the pharmacy advise?)  Is this the correct pharmacy for this prescription? Yes If no, delete pharmacy and type the correct one.  This is the patient's preferred pharmacy:  [facility] [address] [place] - [address] [place] [address] Phone: [phone] Fax: [phone]   Has the prescription been filled recently? Yes  Is the patient out of the medication? No  Has the patient been seen for an appointment in the last year OR does the patient have an upcoming appointment?   Can we respond through MyChart?   Agent: Please be advised that Rx refills may take up to 3 business days. We ask that you follow-up with your pharmacy.

## 2023-04-14 NOTE — Telephone Encounter (Signed)
Copied from CRM [id number]. Topic: Clinical - Medication Refill >> Apr 14, 2023 11:39 AM Drema Balzarine wrote: Most Recent Primary Care Visit:  Provider: Ardith Dark  Department: [facility]  Visit Type: OFFICE VISIT  Date: 02/16/2023  Medication: ***  Has the patient contacted their pharmacy?  (Agent: If no, request that the patient contact the pharmacy for the refill. If patient does not wish to contact the pharmacy document the reason why and proceed with request.) (Agent: If yes, when and what did the pharmacy advise?)  Is this the correct pharmacy for this prescription?  If no, delete pharmacy and type the correct one.  This is the patient's preferred pharmacy:  [facility] [address] [place] - [address] [place] [address] Phone: [phone] Fax: [phone]  [facility] - [place], [place] - [address] [place] [address] Phone: [phone] Fax: [phone]   Has the prescription been filled recently?   Is the patient out of the medication?   Has the patient been seen for an appointment in the last year OR does the patient have an upcoming appointment?   Can we respond through MyChart?   Agent: Please be advised that Rx refills may take up to 3 business days. We ask that you follow-up with your pharmacy.

## 2023-04-15 MED ORDER — OXYCODONE-ACETAMINOPHEN 10-325 MG PO TABS: ORAL_TABLET | ORAL | 0 refills | Status: DC

## 2023-04-26 DIAGNOSIS — M79642 Pain in left hand: Secondary | ICD-10-CM | POA: Diagnosis not present

## 2023-04-26 DIAGNOSIS — M0609 Rheumatoid arthritis without rheumatoid factor, multiple sites: Secondary | ICD-10-CM | POA: Diagnosis not present

## 2023-04-26 DIAGNOSIS — M79672 Pain in left foot: Secondary | ICD-10-CM | POA: Diagnosis not present

## 2023-04-26 DIAGNOSIS — R768 Other specified abnormal immunological findings in serum: Secondary | ICD-10-CM | POA: Diagnosis not present

## 2023-04-26 DIAGNOSIS — E559 Vitamin D deficiency, unspecified: Secondary | ICD-10-CM | POA: Diagnosis not present

## 2023-04-26 DIAGNOSIS — Z6834 Body mass index (BMI) 34.0-34.9, adult: Secondary | ICD-10-CM | POA: Diagnosis not present

## 2023-04-26 DIAGNOSIS — R5383 Other fatigue: Secondary | ICD-10-CM | POA: Diagnosis not present

## 2023-04-26 DIAGNOSIS — M79641 Pain in right hand: Secondary | ICD-10-CM | POA: Diagnosis not present

## 2023-04-26 DIAGNOSIS — M79671 Pain in right foot: Secondary | ICD-10-CM | POA: Diagnosis not present

## 2023-04-27 ENCOUNTER — Other Ambulatory Visit: Payer: Self-pay | Admitting: Physician Assistant

## 2023-05-07 ENCOUNTER — Other Ambulatory Visit: Payer: Self-pay | Admitting: Family Medicine

## 2023-05-17 ENCOUNTER — Other Ambulatory Visit: Payer: Self-pay | Admitting: Family Medicine

## 2023-05-17 NOTE — Telephone Encounter (Signed)
02/16/2023 LOV  04/15/2023 Fill Date  120/0 refills

## 2023-05-17 NOTE — Telephone Encounter (Unsigned)
Copied from CRM [id number]. Topic: Clinical - Medication Refill >> May 17, 2023  9:02 AM Steele Sizer wrote: Most Recent Primary Care Visit:  Provider: Ardith Dark  Department: [facility]  Visit Type: OFFICE VISIT  Date: 02/16/2023  Medication: oxyCODONE-acetaminophen (PERCOCET) 10-325 MG  Has the patient contacted their pharmacy? Yes (Agent: If no, request that the patient contact the pharmacy for the refill. If patient does not wish to contact the pharmacy document the reason why and proceed with request.) (Agent: If yes, when and what did the pharmacy advise?) Pt stated that the pharmacy told her to give [place] a call   Is this the correct pharmacy for this prescription? Yes If no, delete pharmacy and type the correct one.  This is the patient's preferred pharmacy:  [facility] [address] [place] - [address] [place] [address] Phone: [phone] Fax: [phone]   Has the prescription been filled recently? Yes  Is the patient out of the medication? No  Has the patient been seen for an appointment in the last year OR does the patient have an upcoming appointment? {yes/no:20286}  Can we respond through MyChart? {yes/no:20286}  Agent: Please be advised that Rx refills may take up to 3 business days. We ask that you follow-up with your pharmacy.

## 2023-05-18 MED ORDER — OXYCODONE-ACETAMINOPHEN 10-325 MG PO TABS: ORAL_TABLET | ORAL | 0 refills | Status: DC

## 2023-05-20 ENCOUNTER — Ambulatory Visit (INDEPENDENT_AMBULATORY_CARE_PROVIDER_SITE_OTHER): Payer: Medicare Other | Admitting: Family Medicine

## 2023-05-20 ENCOUNTER — Encounter: Payer: Self-pay | Admitting: Family Medicine

## 2023-05-20 VITALS — BP 130/87 | HR 90 | Temp 97.0°F | Ht 62.0 in

## 2023-05-20 DIAGNOSIS — G894 Chronic pain syndrome: Secondary | ICD-10-CM | POA: Diagnosis not present

## 2023-05-20 DIAGNOSIS — M1990 Unspecified osteoarthritis, unspecified site: Secondary | ICD-10-CM

## 2023-05-20 DIAGNOSIS — E1169 Type 2 diabetes mellitus with other specified complication: Secondary | ICD-10-CM | POA: Diagnosis not present

## 2023-05-20 DIAGNOSIS — M359 Systemic involvement of connective tissue, unspecified: Secondary | ICD-10-CM | POA: Diagnosis not present

## 2023-05-20 LAB — MICROALBUMIN / CREATININE URINE RATIO
Creatinine,U: 170 mg/dL
Microalb Creat Ratio: 1.3 mg/g (ref 0.0–30.0)
Microalb, Ur: 2.3 mg/dL — ABNORMAL HIGH (ref 0.0–1.9)

## 2023-05-20 LAB — POCT GLYCOSYLATED HEMOGLOBIN (HGB A1C): Hemoglobin A1C: 5.6 % (ref 4.0–5.6)

## 2023-05-20 MED ORDER — APIXABAN 5 MG PO TABS: 5.0000 mg | ORAL_TABLET | Freq: Two times a day (BID) | ORAL | 3 refills | Status: DC

## 2023-05-20 NOTE — Assessment & Plan Note (Signed)
Is well-controlled on current regimen oxycodone 10-325 five times daily as needed.  Medications with ability to perform activity living.  To help managing her pain as well.  She is having more pain in her knees and is considering knee replacement surgery soon.  Does not need refill today.  Database was reviewed without red flags.  She is not having any significant side effects.  Follow-up in 3 months.

## 2023-05-20 NOTE — Assessment & Plan Note (Signed)
Following with rheumatology.  Recently started on methotrexate.  She has not had much improvement in symptoms yet but has only been on this for a couple of weeks.  She will follow-up with them again soon.

## 2023-05-20 NOTE — Assessment & Plan Note (Signed)
Following with orthopedics.  She is considering knee replacement surgery.  She will follow-up with them soon to discuss.

## 2023-05-20 NOTE — Assessment & Plan Note (Signed)
A1c today is at goal off meds.  Recheck in 3 to 6 months.

## 2023-05-20 NOTE — Patient Instructions (Addendum)
It was very nice to see you today!  Your sugar is at goal.   We will continue with your current medications.  Please continue to work on diet and exercise.  Return in about 3 months (around 08/18/2023) for Follow Up.   Take care, Dr [name]  PLEASE NOTE:  If you had any lab tests, please let us know if you have not heard back within a few days. You may see your results on mychart before we have a chance to review them but we will give you a call once they are reviewed by [place].   If we ordered any referrals today, please let us know if you have not heard from their office within the next week.   If you had any urgent prescriptions sent in today, please check with the pharmacy within an hour of our visit to make sure the prescription was transmitted appropriately.   Please try these tips to maintain a healthy lifestyle:  Eat at least 3 REAL meals and 1-2 snacks per day.  Aim for no more than 5 hours between eating.  If you eat breakfast, please do so within one hour of getting up.   Each meal should contain half fruits/vegetables, one quarter protein, and one quarter carbs (no bigger than a computer mouse)  Cut down on sweet beverages. This includes juice, soda, and sweet tea.   Drink at least 1 glass of water with each meal and aim for at least 8 glasses per day  Exercise at least 150 minutes every week.

## 2023-05-20 NOTE — Progress Notes (Signed)
   [name] Good is a 73 y.o. female who presents today for an office visit.  Assessment/Plan:  Chronic Problems Addressed Today: Chronic pain syndrome Is well-controlled on current regimen oxycodone 10-325 five times daily as needed.  Medications with ability to perform activity living.  To help managing her pain as well.  She is having more pain in her knees and is considering knee replacement surgery soon.  Does not need refill today.  Database was reviewed without red flags.  She is not having any significant side effects.  Follow-up in 3 months.  T2DM (type 2 diabetes mellitus) (HCC) A1c today is at goal off meds.  Recheck in 3 to 6 months.  Arthritis Following with orthopedics.  She is considering knee replacement surgery.  She will follow-up with them soon to discuss.  Autoimmune disease [facility]) Following with rheumatology.  Recently started on methotrexate.  She has not had much improvement in symptoms yet but has only been on this for a couple of weeks.  She will follow-up with them again soon.     Subjective:  HPI:  See Assessment / plan for status of chronic conditions.  Patient here today for follow-up.  I last saw her 3 months ago.  At that time she was doing well.  We continued her on oxycodone 10-3 25 5  times daily as needed for her chronic pain.  She overall is doing well today.  No acute concerns.  Was recently started on methotrexate by her rheumatologist.  She has been on this for a couple of weeks.  No change in symptoms over that time.       Objective:  Physical Exam: BP 130/87   Pulse 90   Temp (!) 97 F (36.1 C) (Temporal)   Ht 5\' 2"  (1.575 m)   SpO2 97%   BMI 37.20 kg/m   Gen: No acute distress, resting comfortably CV: Regular rate and rhythm with no murmurs appreciated Pulm: Normal work of breathing, clear to auscultation bilaterally with no crackles, wheezes, or rhonchi Neuro: Grossly normal, moves all extremities Psych: Normal affect and thought  content      [name] Good M. [name], MD 05/20/2023 2:38 PM

## 2023-05-21 ENCOUNTER — Encounter: Payer: Self-pay | Admitting: Family Medicine

## 2023-05-21 NOTE — Progress Notes (Signed)
Urine sample is normal

## 2023-05-26 ENCOUNTER — Telehealth: Payer: Self-pay | Admitting: *Deleted

## 2023-05-26 DIAGNOSIS — M0609 Rheumatoid arthritis without rheumatoid factor, multiple sites: Secondary | ICD-10-CM | POA: Diagnosis not present

## 2023-05-26 NOTE — Telephone Encounter (Signed)
Copied from CRM [id number]. Topic: Clinical - Prescription Issue >> May 26, 2023  9:24 AM Fredrich Romans wrote: Reason for CRM: patient would like to know if the office may have any coupons that she can use to help purchase her eliquis medication.She said that she switched insurances and now it is costing her a lot to purchase.   Patient requesting change Rx Eliquis to Rx Xarelto due to insurance copay  Please advise

## 2023-05-27 ENCOUNTER — Other Ambulatory Visit: Payer: Self-pay | Admitting: *Deleted

## 2023-05-27 MED ORDER — RIVAROXABAN 20 MG PO TABS: 20.0000 mg | ORAL_TABLET | Freq: Every day | ORAL | 1 refills | Status: DC

## 2023-05-27 NOTE — Telephone Encounter (Signed)
Patient preferred Rx Xarelto  Do not want to come for check ups since she have to drive long  Rx Xarelto 20 mg daily send to pharmacy

## 2023-05-27 NOTE — Telephone Encounter (Signed)
We can have her come here to see the warfarin clinic if they wish to pursue this instead.  We had discussed this at her previous office visits.  This will be much more cost affordable than the Xarelto or Eliquis would be.    If they do not want to do warfarin, then we can send in Xarelto 20 mg daily.

## 2023-06-01 ENCOUNTER — Telehealth: Payer: Self-pay | Admitting: *Deleted

## 2023-06-01 NOTE — Telephone Encounter (Signed)
Please clarify - we sent in Xarelto per her last phone note.  [name] Good. [name], MD 06/01/2023 8:53 AM

## 2023-06-01 NOTE — Telephone Encounter (Signed)
Spoke with patient, notified Rx Gibson Ramp was send to [facility] on 05/27/2023 Please give them a call for refills and prices  Advise she need to be on this medication  Patient verbalized understanding

## 2023-06-01 NOTE — Telephone Encounter (Signed)
 Copied from CRM [id number]. Topic: Clinical - Prescription Issue >> May 31, 2023  3:34 PM Burnard DEL wrote: Reason for CRM: patient called in stating that due to the cost of medication Eliquis  she's not going to take it.She said that she will only take the plavix .She stated that she's not going to take the warfarin due to having to get checked every month and the side effects of the medication.    See note  [name] Good

## 2023-06-09 ENCOUNTER — Other Ambulatory Visit (INDEPENDENT_AMBULATORY_CARE_PROVIDER_SITE_OTHER): Payer: Medicare Other

## 2023-06-09 ENCOUNTER — Encounter: Payer: Self-pay | Admitting: Orthopaedic Surgery

## 2023-06-09 ENCOUNTER — Ambulatory Visit: Payer: Medicare Other | Admitting: Orthopaedic Surgery

## 2023-06-09 VITALS — Wt 190.0 lb

## 2023-06-09 DIAGNOSIS — M1712 Unilateral primary osteoarthritis, left knee: Secondary | ICD-10-CM | POA: Insufficient documentation

## 2023-06-09 DIAGNOSIS — G8929 Other chronic pain: Secondary | ICD-10-CM

## 2023-06-09 DIAGNOSIS — M25561 Pain in right knee: Secondary | ICD-10-CM

## 2023-06-09 DIAGNOSIS — M1711 Unilateral primary osteoarthritis, right knee: Secondary | ICD-10-CM | POA: Insufficient documentation

## 2023-06-09 DIAGNOSIS — M25562 Pain in left knee: Secondary | ICD-10-CM

## 2023-06-09 NOTE — Progress Notes (Signed)
The patient is a 73 year old female that we have seen before.  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  She last had a steroid injection left knee back in March 2023.  She now barely gets around due to her severe knee pain with the left worse than the right.  It is detrimentally affecting her mobility, her quality of life and her actives daily living.  Her pain is daily with both knees and it is 10 out of 10 in terms of pain.  Her daughter is with her today.  She denies any chest pain, shortness of breath, fever, chills, nausea, vomiting.  Her last hemoglobin A1c was 5.6.  She is on Plavix and Eliquis.  She sees her regular physicians on a regular basis and a do understand that is been recommended she proceed with a left knee replacement.  Examination of both knees shows valgus malalignment of both knees.  Is much worse on the left than the right.  She has significant patellofemoral to rotation and severe pain throughout the arc of motion of both knees.  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  The right knee has some valgus malalignment but not as severe with lateral joint space narrowing.  We had a long and thorough discussion about knee replacement surgery.  I discussed the risks and benefits of the surgery and what to expect from an intraoperative and postoperative standpoint.  I showed them a knee replacement model and went over her x-rays in detail.  She knows to stop Plavix 1 week prior to surgery and Eliquis or Xarelto 3 days before surgery.  All questions and concerns were answered and addressed.  Will work on getting her scheduled soon for hopefully a left total knee replacement.

## 2023-06-16 ENCOUNTER — Other Ambulatory Visit: Payer: Self-pay | Admitting: Family Medicine

## 2023-06-16 NOTE — Telephone Encounter (Signed)
 Last OV: 05/20/23  Next OV: 08/19/23  Last Filled: 05/18/23  Quantity: 150

## 2023-06-16 NOTE — Telephone Encounter (Unsigned)
 Copied from CRM [id number]. Topic: Clinical - Medication Refill >> Jun 16, 2023 11:53 AM Isabell A wrote: Most Recent Primary Care Visit:  Provider: Ardith Dark  Department: [facility]  Visit Type: OFFICE VISIT  Date: 05/20/2023  Medication: oxyCODONE-acetaminophen (PERCOCET) 10-325 MG tablet  Has the patient contacted their pharmacy? Yes (Agent: If no, request that the patient contact the pharmacy for the refill. If patient does not wish to contact the pharmacy document the reason why and proceed with request.) (Agent: If yes, when and what did the pharmacy advise?)  Is this the correct pharmacy for this prescription? Yes If no, delete pharmacy and type the correct one.  This is the patient's preferred pharmacy:  [facility] # [address], [place] - [address] [place] [address] Phone: [phone] Fax: [phone]  Has the prescription been filled recently? Yes  Is the patient out of the medication? No  Has the patient been seen for an appointment in the last year OR does the patient have an upcoming appointment? Yes  Can we respond through MyChart? No  Agent: Please be advised that Rx refills may take up to 3 business days. We ask that you follow-up with your pharmacy.

## 2023-06-17 ENCOUNTER — Telehealth: Payer: Self-pay

## 2023-06-17 NOTE — Telephone Encounter (Signed)
 Copied from CRM [id number]. Topic: General - Other >> Jun 16, 2023  4:06 PM Truddie Crumble wrote: Reason for CRM: patient would like to be called regarding medication warfarin  Called patient unable to LVM voice mail is full [facility]

## 2023-06-17 NOTE — Telephone Encounter (Signed)
 Received fax, via email, from [facility] requesting refill of warfarin. Pt never started warfarin and has been taking Xarelto.  Contacted pharmacy and advised. They verbalized understanding.

## 2023-06-18 ENCOUNTER — Other Ambulatory Visit: Payer: Self-pay | Admitting: Family Medicine

## 2023-06-18 ENCOUNTER — Telehealth: Payer: Self-pay

## 2023-06-18 NOTE — Telephone Encounter (Signed)
 I called patient to discuss scheduling TKA surgery.  Patient did not answer and voice mail was full.  Will try again later.

## 2023-06-23 ENCOUNTER — Telehealth: Payer: Self-pay | Admitting: *Deleted

## 2023-06-23 DIAGNOSIS — Z7901 Long term (current) use of anticoagulants: Secondary | ICD-10-CM

## 2023-06-23 NOTE — Telephone Encounter (Signed)
 Copied from CRM [id number]. Topic: Clinical - Prescription Issue >> Jun 23, 2023 11:36 AM Pascal Lux wrote: Reason for CRM: Patient is requesting a call back in regards to her medication. She stated that the Eliquis is too expensive and looking for other options.

## 2023-06-23 NOTE — Telephone Encounter (Signed)
 Contacted pt and discussed again the potential of switching from Eliquis to warfarin. A full discussion of the nature of warfarin vs Eliqis was carried out. The need for frequent and regular monitoring, precise dosage adjustment and compliance is stressed.  Side effects of potential bleeding are discussed.  The patient should avoid any OTC items containing aspirin or ibuprofen, and should avoid great swings in general diet.  Avoid alcohol consumption.   Pt reported Eliquis is now $280/month. Advised pt of the new payment program Medicare Part D offers. Pt was not interested in this route. She said she could not afford that payment either. She is wanting to switch to warfarin. Discussed regular monitoring, weekly x 4 weeks to begin, then Q 2 weeks, then 3 weeks and so on. Pt reports she thinks she can have the lab performed at a local [facility] that contains a [facility] and is requesting to use [facility] so she does not need to make the long travel to the clinic.  Pt also reported she is supposed to have knee surgery in approximately 4 weeks. Advised this would not be the best time to start warfarin then because her dosing may not be regulated by the time of the surgery and having to take her off of warfarin for the surgery. She is going to contact ortho to see if she has a date for surgery yet. Nothing in chart is scheduled yet.  Advised pt to continue Eliquis, she reports she has 3 or 4 more tablets, and this nurse will send a msg to PCP to inquire his recommendation for starting warfarin. Advised this nurse will contact her when the msg is returned from PCP. Advised it could be 1-2 days before returning call. Provided direct number to coumadin clinic. Pt verbalized understanding.

## 2023-06-24 MED ORDER — WARFARIN SODIUM 5 MG PO TABS: 5.0000 mg | ORAL_TABLET | Freq: Every day | ORAL | 0 refills | Status: DC

## 2023-06-24 NOTE — Telephone Encounter (Signed)
 I think switching to warfarin with a Lovenox bridge around her surgery is the best course of action.  [name]. [name], MD 06/24/2023 12:27 PM

## 2023-06-24 NOTE — Addendum Note (Signed)
 Addended by: Sherrie George A on: 06/24/2023 05:08 PM   Modules accepted: Orders

## 2023-06-24 NOTE — Telephone Encounter (Signed)
 Contacted pt and advised there may be a need for a lovenox bridge around surgery. Pt will check with insurance but is ok with injections if necessary. Answered all questions concerning warfarin. Advised if any s/s of abnormal bruising or bleeding to go to ER. Advised to finish the rest of the Eliquis she has left, she reports 2 days left. Advised to start warfarin tomorrow evening. Advised she will need to go to lab on 3/5 for INR check. She will check with lab to assure their hours and if she needs an apt. Advised this nurse will f/u with her on 3/4 before her lab on 3/5 to assure she is doing ok. Provided coumadin clinic direct number again. Provided pt with [employer] website for further research on anticoagulation and blood clots. Pt verbalized understanding.  Sent in warfarin prescription.

## 2023-06-28 DIAGNOSIS — M79642 Pain in left hand: Secondary | ICD-10-CM | POA: Diagnosis not present

## 2023-06-28 DIAGNOSIS — M79672 Pain in left foot: Secondary | ICD-10-CM | POA: Diagnosis not present

## 2023-06-28 DIAGNOSIS — E559 Vitamin D deficiency, unspecified: Secondary | ICD-10-CM | POA: Diagnosis not present

## 2023-06-28 DIAGNOSIS — R5383 Other fatigue: Secondary | ICD-10-CM | POA: Diagnosis not present

## 2023-06-28 DIAGNOSIS — M79671 Pain in right foot: Secondary | ICD-10-CM | POA: Diagnosis not present

## 2023-06-28 DIAGNOSIS — R768 Other specified abnormal immunological findings in serum: Secondary | ICD-10-CM | POA: Diagnosis not present

## 2023-06-28 DIAGNOSIS — M0609 Rheumatoid arthritis without rheumatoid factor, multiple sites: Secondary | ICD-10-CM | POA: Diagnosis not present

## 2023-06-28 DIAGNOSIS — M79641 Pain in right hand: Secondary | ICD-10-CM | POA: Diagnosis not present

## 2023-06-29 MED ORDER — ENOXAPARIN SODIUM 120 MG/0.8ML IJ SOSY: 120.0000 mg | PREFILLED_SYRINGE | INTRAMUSCULAR | 0 refills | Status: DC

## 2023-06-29 NOTE — Telephone Encounter (Signed)
 Contacted pt and advised of new script. Pt verbalized understanding.

## 2023-06-29 NOTE — Addendum Note (Signed)
 Addended by: Sherrie George A on: 06/29/2023 03:46 PM   Modules accepted: Orders

## 2023-06-29 NOTE — Telephone Encounter (Signed)
 Contacted pt's daughter, [name] Good, and educated concerning warfarin and need for monitoring. She was aware that pt should also be on Lovenox until warfarin is therapeutic. She reports pt will use the injections if her insurance will pay for them. She will tell her to contact the pharmacy to inquire about cost. Requested pt contact coumadin clinic on whether she is going to use the Lovenox or not.  [name] Good reports she can take pt to coumadin clinic apts and requested afternoon apts. Scheduled her for Monday, 3/10, at [facility]. Provided address to clinic.  Educated about [employer] for further Agricultural engineer and that printed educational material can be provided at her first apt.  Educated pt's daughter about s/s of a clot and also s/s of abnormal bruising or bleeding and to go to the ER if any s/s.  Advised if any further questions to feel free to contact coumadin clinic. Direct number provided. Pt's daughter verbalized understanding.   Lovenox prescription was sent on 02/24/23 for 7 syringes. Advised her daughter to let the pharmacy know of the date of the prescription and if a new prescription is needed to contact the coumadin clinic. Pt's daughter verbalized understanding.   Pt will contact coumadin clinic to report if she is going to use Lovenox or not.

## 2023-06-29 NOTE — Telephone Encounter (Signed)
 Pt LVM reporting pharmacy did not have the script for Lovenox. She requested a new script sent to [facility].  Pt's weight has decreased since sending in first script. Wt currently is 86 kg. Pt will only need 120 mg Lovenox Q24H.   Sent in new script.

## 2023-06-29 NOTE — Telephone Encounter (Signed)
 Pt LVM yesterday reporting knee surgery is scheduled for 08/31/23.   Contacted pt and advised we will discuss dosing around the surgery when it is closer to surgery.  Pt reported she heard there was an interaction with warfarin and methotrexate, which she is already taking. So, she did not start warfarin on 2/28 as directed last week. She was concerned about the interaction and she said she was also confused about what she was told to do with her Eliquis. She wasn't sure if she was supposed to take Eliquis and warfarin together. She finished her last Eliquis yesterday. She reports she has some problems now with being overwhelmed and also forgetting things since her stroke. She requested this nurse contact her daughter to help with instructions and also she will be providing transportation for her to go to the lab for INR check. She is not sure when her daughter will have time.  Advised pt this nurse will contact her daughter and review warfarin dosing and the need for INR checks. Advised she does not need to go to the lab tomorrow as advised last week since she did not start taking the warfarin yet. Advised it would be best to check INR on 3/10. Advised will inquire if her daughter can take her on 3/10 and her daughter will f/u with her on when she can take her.  Advised again the importance of starting warfarin because she is at risk for another clot. Educated pt on signs and symptoms of clots. Advised if any s/s to go to ER or call 911. Pt verbalized understanding and said she would start warfarin this evening.  Advised if any further questions to contact coumadin clinic. Pt verified she has number to coumadin clinic.

## 2023-07-01 ENCOUNTER — Other Ambulatory Visit: Payer: Self-pay | Admitting: Physician Assistant

## 2023-07-05 ENCOUNTER — Encounter (HOSPITAL_BASED_OUTPATIENT_CLINIC_OR_DEPARTMENT_OTHER): Payer: Self-pay | Admitting: Emergency Medicine

## 2023-07-05 ENCOUNTER — Other Ambulatory Visit: Payer: Self-pay

## 2023-07-05 ENCOUNTER — Observation Stay (HOSPITAL_BASED_OUTPATIENT_CLINIC_OR_DEPARTMENT_OTHER)
Admission: EM | Admit: 2023-07-05 | Discharge: 2023-07-07 | Disposition: A | Discharge status: Home / Self Care | Attending: Emergency Medicine | Admitting: Emergency Medicine

## 2023-07-05 ENCOUNTER — Ambulatory Visit (INDEPENDENT_AMBULATORY_CARE_PROVIDER_SITE_OTHER)

## 2023-07-05 DIAGNOSIS — E785 Hyperlipidemia, unspecified: Secondary | ICD-10-CM | POA: Insufficient documentation

## 2023-07-05 DIAGNOSIS — Z7902 Long term (current) use of antithrombotics/antiplatelets: Secondary | ICD-10-CM | POA: Diagnosis not present

## 2023-07-05 DIAGNOSIS — Z79899 Other long term (current) drug therapy: Secondary | ICD-10-CM | POA: Diagnosis not present

## 2023-07-05 DIAGNOSIS — R791 Abnormal coagulation profile: Secondary | ICD-10-CM | POA: Diagnosis not present

## 2023-07-05 DIAGNOSIS — E669 Obesity, unspecified: Secondary | ICD-10-CM | POA: Diagnosis present

## 2023-07-05 DIAGNOSIS — Z6835 Body mass index (BMI) 35.0-35.9, adult: Secondary | ICD-10-CM | POA: Insufficient documentation

## 2023-07-05 DIAGNOSIS — M069 Rheumatoid arthritis, unspecified: Secondary | ICD-10-CM | POA: Insufficient documentation

## 2023-07-05 DIAGNOSIS — Z7901 Long term (current) use of anticoagulants: Secondary | ICD-10-CM | POA: Insufficient documentation

## 2023-07-05 DIAGNOSIS — M359 Systemic involvement of connective tissue, unspecified: Secondary | ICD-10-CM | POA: Diagnosis present

## 2023-07-05 DIAGNOSIS — Z8673 Personal history of transient ischemic attack (TIA), and cerebral infarction without residual deficits: Secondary | ICD-10-CM

## 2023-07-05 DIAGNOSIS — G2581 Restless legs syndrome: Secondary | ICD-10-CM | POA: Diagnosis not present

## 2023-07-05 DIAGNOSIS — F32A Depression, unspecified: Secondary | ICD-10-CM | POA: Diagnosis not present

## 2023-07-05 DIAGNOSIS — Z96641 Presence of right artificial hip joint: Secondary | ICD-10-CM | POA: Insufficient documentation

## 2023-07-05 DIAGNOSIS — Z87891 Personal history of nicotine dependence: Secondary | ICD-10-CM | POA: Insufficient documentation

## 2023-07-05 LAB — BASIC METABOLIC PANEL
Anion gap: 5 (ref 5–15)
BUN: 20 mg/dL (ref 8–23)
CO2: 28 mmol/L (ref 22–32)
Calcium: 9.2 mg/dL (ref 8.9–10.3)
Chloride: 104 mmol/L (ref 98–111)
Creatinine, Ser: 0.87 mg/dL (ref 0.44–1.00)
GFR, Estimated: >60 mL/min (ref >60)
Glucose, Bld: 71 mg/dL (ref 70–99)
Potassium: 3.7 mmol/L (ref 3.5–5.1)
Sodium: 137 mmol/L (ref 135–145)

## 2023-07-05 LAB — CBC WITH DIFFERENTIAL/PLATELET
Abs Immature Granulocytes: 0.02 10*3/uL (ref 0.00–0.07)
Basophils Absolute: 0.1 10*3/uL (ref 0.0–0.1)
Basophils Relative: 1 %
Eosinophils Absolute: 0.2 10*3/uL (ref 0.0–0.5)
Eosinophils Relative: 3 %
HCT: 32.6 % — ABNORMAL LOW (ref 36.0–46.0)
Hemoglobin: 10.7 g/dL — ABNORMAL LOW (ref 12.0–15.0)
Immature Granulocytes: 0 %
Lymphocytes Relative: 33 %
Lymphs Abs: 2.1 10*3/uL (ref 0.7–4.0)
MCH: 31.8 pg (ref 26.0–34.0)
MCHC: 32.8 g/dL (ref 30.0–36.0)
MCV: 97 fL (ref 80.0–100.0)
Monocytes Absolute: 0.7 10*3/uL (ref 0.1–1.0)
Monocytes Relative: 11 %
Neutro Abs: 3.2 10*3/uL (ref 1.7–7.7)
Neutrophils Relative %: 52 %
Platelets: 239 10*3/uL (ref 150–400)
RBC: 3.36 MIL/uL — ABNORMAL LOW (ref 3.87–5.11)
RDW: 15.4 % (ref 11.5–15.5)
WBC: 6.3 10*3/uL (ref 4.0–10.5)
nRBC: 0 % (ref 0.0–0.2)

## 2023-07-05 LAB — PROTIME-INR
INR: >10 ratio (ref 0.8–1.0)
INR: 9.1 (ref 0.8–1.2)
Prothrombin Time: 120 s (ref 9.6–13.1)
Prothrombin Time: 74.2 s — ABNORMAL HIGH (ref 11.4–15.2)

## 2023-07-05 LAB — POCT INR: INR: 8 — AB (ref 2.0–3.0)

## 2023-07-05 MED ORDER — VITAMIN K1 10 MG/ML IJ SOLN: 10.0000 mg | Freq: Once | INTRAMUSCULAR | Status: DC

## 2023-07-05 MED ORDER — VITAMIN K1 10 MG/ML IJ SOLN
10.0000 mg | Freq: Once | INTRAVENOUS | Status: AC
  Administered 2023-07-05: 10 mg via INTRAVENOUS
  Filled 2023-07-05: qty 1

## 2023-07-05 MED ORDER — VITAMIN K1 10 MG/ML IJ SOLN
INTRAMUSCULAR | Status: AC
  Filled 2023-07-05: qty 1

## 2023-07-05 NOTE — ED Notes (Signed)
 Unsuccessful venipuncture due to pt's family asking for someone else to draw blood, however blood was in tube.Marland KitchenMarland Kitchen

## 2023-07-05 NOTE — Progress Notes (Signed)
 Received critical INR from Luellen Pucker, medical technologist, at [facility] lab. INR > 10 PT >120 Clydie Braun reports blood would not clot so no exact result can be given for INR.  Discussed with Dr. Caryl Never, agree pt should go to ER even though pt denies any bleeding.   Contacted pt's daughter and advised need to go to ER. She reports pt will not go to [facility] ER because she will not sit there an wait due to pain in her legs. Advised to go to [facility] ER. Advised if pt needs admitted for observation she will need to go to [facility] or [facility]. Advised if she started at the hospital instead of the [facility] ER it would make the transition easier for her if admittance was needed. She reports pt will not agree to going to the hospital ER at this moment. She will take her to [facility] ER. Advised if any s/s of abnormal bruising or bleeding to call 911. Jacaria verbalized understanding.  Contacted [facility] and spoke with Dr. Rhunette Croft. He reports they do have IV vitamin K but if pt needs admitted she will need to go to hospital and not ER. Advised pt is refusing to go to hospital ER due to wait time.

## 2023-07-05 NOTE — Patient Instructions (Addendum)
 Pre visit review using our clinic review tool, if applicable. No additional management support is needed unless otherwise documented below in the visit note.  Hold all warfarin until contacted by coumadin clinic. If you have any signs or symptoms of abnormal bruising or bleeding go to the emergency room.

## 2023-07-05 NOTE — Progress Notes (Addendum)
 Pt reports she started warfarin on 3/4 but did NOT start Lovenox because the pharmacy had to order it and then she forgot to pick it up.  Pt denies any abnormal bruising or bleeding. Pt is here today with her daughter, Brionna Romanek. Advised if any s/s of abnormal bruising or bleeding to go to ER. Advised a lab INR will be drawn today and this coumadin clinic nurse will contact her later today with further instructions. Pt and her daughter verbalized understanding.  STAT lab order for POCT INR was placed and pt is having drawn now. Will await result and then decide when pt will be retested. Most likely retest INR on 3/14 due to daughter's availability to bring her to apt.  Hold all warfarin until contacted by coumadin clinic. If you have any signs or symptoms of abnormal bruising or bleeding go to the emergency room.   Also sent staff msg to PCP as FYI.  Made Dr. Caryl Never aware of result and he is in agreement with prescribing vitamin K tablet, 5 mg and pt take 1/2 tablet if it is available at pharmacy.    Contacted [facility] to inquire of availability of (mephytoin) vitamin K tablet, 5 mg. Pharmacist reports he does not have in stock.  Contacted [facility] pharmacy for vitamin K tablet and spoke with Amanie, pharmacist, who reports she would have to order it and it would be in before 12 pm tomorrow.   Contacted pt's daughter, Thelia Tanksley, and advised of need for [facility] to order vitamin K if needed. it. She would like a script sent and if it is not needed she will not pick it up. Advised her to hold the tablet until she is contacted by coumadin clinic and advised whether pt needs it or not.

## 2023-07-05 NOTE — ED Provider Notes (Signed)
 [place] EMERGENCY DEPARTMENT AT [facility] Provider Note   CSN: [id number] Arrival date & time: 07/05/23  1858     History  No chief complaint on file.   [name] Good is a 73 y.o. female.  HPI    73 year old female comes in with chief complaint of elevated INR. 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  Patient was advised to come to the ER by Coumadin clinic because of elevated INR.  According the patient, she used to be on DOAC, however had to switch to Coumadin because of insurance issues.  She went up on Coumadin dose recently.  Patient denies any bloody stools, bloody urine, melena, no falls or new bruising.  Home Medications Prior to Admission medications   Medication Sig Start Date End Date Taking? Authorizing Provider  atorvastatin (LIPITOR) 80 MG tablet Take 1 tablet (80 mg total) by mouth daily. 12/29/22   Ardith Dark, MD  baclofen (LIORESAL) 10 MG tablet TAKE 1 TABLET BY MOUTH 3 TIMES DAILY 10/19/22   Ardith Dark, MD  calcium carbonate (TUMS EX) 750 MG chewable tablet Chew 1 tablet by mouth 2 (two) times daily as needed for heartburn.    [provider]  clopidogrel (PLAVIX) 75 MG tablet TAKE 1 TABLET BY MOUTH DAILY 07/01/23   Corky Crafts, MD  diphenhydrAMINE (BENADRYL) 25 mg capsule Take 25 mg by mouth every 6 (six) hours as needed for sleep or allergies.    [provider]  docusate sodium (COLACE) 100 MG capsule Take 100 mg by mouth 2 (two) times daily.    [provider]  enoxaparin (LOVENOX) 120 MG/0.8ML injection Inject 0.8 mLs (120 mg total) into the skin daily. TAKE AS DIRECTED BY ANTICOAGULATION CLINIC 06/29/23   Ardith Dark, MD  escitalopram (LEXAPRO) 20 MG tablet TAKE 1 TABLET BY MOUTH DAILY 03/29/23   Ardith Dark, MD  ezetimibe (ZETIA) 10 MG tablet TAKE 1 TABLET BY MOUTH DAILY 05/07/23   Ardith Dark, MD  famotidine (PEPCID) 20 MG tablet Take 20 mg by mouth daily as needed for heartburn or indigestion.    [provider]  folic acid (FOLVITE) 1 MG tablet Take 1 mg by mouth daily.    [provider]  methotrexate (RHEUMATREX) 2.5 MG tablet Take 2.5 mg by mouth once a week. Caution:Chemotherapy. Protect from light.    [provider]  ondansetron (ZOFRAN) 4 MG tablet Take 1 tablet (4 mg total) by mouth every 8 (eight) hours as needed for nausea or vomiting. 12/01/22   Ardith Dark, MD  oxyCODONE-acetaminophen (PERCOCET) 10-325 MG tablet TAKE 1 TABLET BY MOUTH 5 TIMES DAILY 06/18/23   Ardith Dark, MD  phentermine 37.5 MG capsule Take 1 capsule (37.5 mg total) by mouth every morning. 04/08/23   Ardith Dark, MD  predniSONE (DELTASONE) 5 MG tablet TAKE 1 TABLET BY MOUTH DAILY 11/02/22   Ardith Dark, MD  rOPINIRole (REQUIP) 3 MG tablet TAKE 1 TABLET BY MOUTH AT BEDTIME AS NEEDED Patient taking differently: Take 3 mg by mouth at bedtime. 09/23/22   Ardith Dark, MD  Turmeric (QC TUMERIC COMPLEX PO) Take by mouth.    [provider]  warfarin (COUMADIN) 5 MG tablet Take 1 tablet (5 mg total) by mouth daily. 06/24/23   Ardith Dark, MD      Allergies    Amoxicillin    Review  of Systems   Review of Systems  All other systems reviewed and are negative.   Physical Exam Updated Vital Signs BP 123/68   Pulse 86   Temp 98.2 F (36.8 C)   Resp 18   Wt 83.5 kg   SpO2 99%   BMI 33.65 kg/m  Physical Exam Vitals and nursing note reviewed.  Constitutional:      Appearance: She is well-developed.  HENT:     Head: Atraumatic.  Cardiovascular:     Rate and Rhythm: Normal rate.  Pulmonary:     Effort: Pulmonary effort is normal.  Musculoskeletal:     Cervical back: Normal range of motion and neck supple.  Skin:    General: Skin is warm and dry.     Findings: Bruising present.  Neurological:     Mental Status: She is alert and oriented to  person, place, and time.     ED Results / Procedures / Treatments   Labs (all labs ordered are listed, but only abnormal results are displayed) Labs Reviewed  CBC WITH DIFFERENTIAL/PLATELET  BASIC METABOLIC PANEL  PROTIME-INR    EKG None  Radiology No results found.  Procedures .Critical Care  Performed by: Derwood Kaplan, MD Authorized by: Derwood Kaplan, MD   Critical care provider statement:    Critical care time (minutes):  41   Critical care was necessary to treat or prevent imminent or life-threatening deterioration of the following conditions: Supratherapeutic INR, WHICH IS greater than 10.   Critical care was time spent personally by me on the following activities:  Development of treatment plan with patient or surrogate, discussions with consultants, evaluation of patient's response to treatment, examination of patient, ordering and review of laboratory studies, ordering and review of radiographic studies, ordering and performing treatments and interventions, pulse oximetry, re-evaluation of patient's condition and review of old charts     Medications Ordered in ED Medications  phytonadione (VITAMIN K) 10 mg in dextrose 5 % 50 mL IVPB (has no administration in time range)    ED Course/ Medical Decision Making/ A&P                                 Medical Decision Making Amount and/or Complexity of Data Reviewed Labs: ordered.  Risk Decision regarding hospitalization.  This patient presents to the ED with chief complaint(s) of elevated INR with pertinent past medical history of diabetes, stroke, CAD and mesenteric thrombosis, questionable hypercoagulable disease for which patient is on Coumadin.  It appears that patient recently switched to Coumadin from DOAC. The complaint involves an extensive differential diagnosis and also carries with it a high risk of complications and morbidity.    The differential diagnosis includes : Renal failure, inappropriate  dosing, dietary interactions, drug interactions.  Fortunately, patient denies any bloody stools, tarry stools, bloody urine and has had no trauma.  The initial plan is to check CBC, metabolic profile, INR.  We will give patient vitamin K IV. She will need admission to the hospital for optimization of her INR.   Additional history obtained: Additional history obtained from family Records reviewed Primary Care Documents and oncology notes  Independent labs interpretation:  The following labs were independently interpreted: INR greater than 10 per outside hospital lab results     Final Clinical Impression(s) / ED Diagnoses Final diagnoses:  Supratherapeutic INR    Rx / DC Orders ED Discharge Orders     None  Derwood Kaplan, MD 07/05/23 2019

## 2023-07-05 NOTE — ED Triage Notes (Signed)
 High INR > 8 Just started coumadin  (stroke and hx blood clot) Started 5 mg daily on Tuesday Transitioned off eliquis  Denies any bruising, bleeding or pain

## 2023-07-05 NOTE — ED Notes (Addendum)
 Report given to [facility] RN

## 2023-07-05 NOTE — Progress Notes (Signed)
 Patient is transferred from [facility] ED to 5E at 2320. Alert and oriented x 4. Room is set up, call light is within patient's reach. Paged MD admission on call at the same time. Waiting for new orders.

## 2023-07-05 NOTE — ED Notes (Addendum)
 Pt here re: abnormal labs INR>10 Pt is transitioning off eliquis and started on Coumadin last Tuesday. Pt has bruising noted on bilateral arms Attempted IV unsuccessful, blood drawn and sent. [place] Iv requested

## 2023-07-06 DIAGNOSIS — R791 Abnormal coagulation profile: Secondary | ICD-10-CM | POA: Diagnosis not present

## 2023-07-06 LAB — CBC WITH DIFFERENTIAL/PLATELET
Abs Immature Granulocytes: 0.01 10*3/uL (ref 0.00–0.07)
Basophils Absolute: 0.1 10*3/uL (ref 0.0–0.1)
Basophils Relative: 1 %
Eosinophils Absolute: 0.3 10*3/uL (ref 0.0–0.5)
Eosinophils Relative: 4 %
HCT: 33.3 % — ABNORMAL LOW (ref 36.0–46.0)
Hemoglobin: 10.5 g/dL — ABNORMAL LOW (ref 12.0–15.0)
Immature Granulocytes: 0 %
Lymphocytes Relative: 33 %
Lymphs Abs: 2 10*3/uL (ref 0.7–4.0)
MCH: 31.7 pg (ref 26.0–34.0)
MCHC: 31.5 g/dL (ref 30.0–36.0)
MCV: 100.6 fL — ABNORMAL HIGH (ref 80.0–100.0)
Monocytes Absolute: 0.7 10*3/uL (ref 0.1–1.0)
Monocytes Relative: 11 %
Neutro Abs: 3 10*3/uL (ref 1.7–7.7)
Neutrophils Relative %: 51 %
Platelets: 232 10*3/uL (ref 150–400)
RBC: 3.31 MIL/uL — ABNORMAL LOW (ref 3.87–5.11)
RDW: 15.2 % (ref 11.5–15.5)
WBC: 6 10*3/uL (ref 4.0–10.5)
nRBC: 0 % (ref 0.0–0.2)

## 2023-07-06 LAB — COMPREHENSIVE METABOLIC PANEL
ALT: 32 U/L (ref 0–44)
AST: 42 U/L — ABNORMAL HIGH (ref 15–41)
Albumin: 3 g/dL — ABNORMAL LOW (ref 3.5–5.0)
Alkaline Phosphatase: 75 U/L (ref 38–126)
Anion gap: 5 (ref 5–15)
BUN: 18 mg/dL (ref 8–23)
CO2: 27 mmol/L (ref 22–32)
Calcium: 8.9 mg/dL (ref 8.9–10.3)
Chloride: 107 mmol/L (ref 98–111)
Creatinine, Ser: 0.78 mg/dL (ref 0.44–1.00)
GFR, Estimated: >60 mL/min (ref >60)
Glucose, Bld: 99 mg/dL (ref 70–99)
Potassium: 4 mmol/L (ref 3.5–5.1)
Sodium: 139 mmol/L (ref 135–145)
Total Bilirubin: 0.6 mg/dL (ref 0.0–1.2)
Total Protein: 6 g/dL — ABNORMAL LOW (ref 6.5–8.1)

## 2023-07-06 LAB — MAGNESIUM: Magnesium: 2.1 mg/dL (ref 1.7–2.4)

## 2023-07-06 LAB — PROTIME-INR
INR: 1.7 — ABNORMAL HIGH (ref 0.8–1.2)
Prothrombin Time: 20 s — ABNORMAL HIGH (ref 11.4–15.2)

## 2023-07-06 LAB — APTT: aPTT: 37 s — ABNORMAL HIGH (ref 24–36)

## 2023-07-06 MED ORDER — ROPINIROLE HCL 1 MG PO TABS: 3.0000 mg | ORAL_TABLET | Freq: Every day | ORAL | Status: DC

## 2023-07-06 MED ORDER — PHENTERMINE HCL 37.5 MG PO CAPS: 37.5000 mg | ORAL_CAPSULE | ORAL | Status: DC

## 2023-07-06 MED ORDER — ALBUTEROL SULFATE (2.5 MG/3ML) 0.083% IN NEBU: 2.5000 mg | INHALATION_SOLUTION | RESPIRATORY_TRACT | Status: DC | PRN

## 2023-07-06 MED ORDER — WARFARIN SODIUM 5 MG PO TABS
5.0000 mg | ORAL_TABLET | Freq: Once | ORAL | Status: AC
  Administered 2023-07-06: 5 mg via ORAL
  Filled 2023-07-06: qty 1

## 2023-07-06 MED ORDER — ENOXAPARIN SODIUM 120 MG/0.8ML IJ SOSY
120.0000 mg | PREFILLED_SYRINGE | INTRAMUSCULAR | Status: DC
  Administered 2023-07-06 – 2023-07-07 (×2): 120 mg via SUBCUTANEOUS
  Filled 2023-07-06 (×2): qty 0.8

## 2023-07-06 MED ORDER — ONDANSETRON HCL 4 MG/2ML IJ SOLN: 4.0000 mg | Freq: Four times a day (QID) | INTRAMUSCULAR | Status: DC | PRN

## 2023-07-06 MED ORDER — PREDNISONE 5 MG PO TABS
5.0000 mg | ORAL_TABLET | Freq: Every day | ORAL | Status: DC
  Administered 2023-07-06 – 2023-07-07 (×2): 5 mg via ORAL
  Filled 2023-07-06 (×2): qty 1

## 2023-07-06 MED ORDER — ESCITALOPRAM OXALATE 20 MG PO TABS
20.0000 mg | ORAL_TABLET | Freq: Every day | ORAL | Status: DC
  Administered 2023-07-06 – 2023-07-07 (×2): 20 mg via ORAL
  Filled 2023-07-06 (×2): qty 1

## 2023-07-06 MED ORDER — ACETAMINOPHEN 650 MG RE SUPP: 650.0000 mg | Freq: Four times a day (QID) | RECTAL | Status: DC | PRN

## 2023-07-06 MED ORDER — ROPINIROLE HCL 1 MG PO TABS
3.0000 mg | ORAL_TABLET | Freq: Three times a day (TID) | ORAL | Status: DC | PRN
  Administered 2023-07-06 – 2023-07-07 (×3): 3 mg via ORAL
  Filled 2023-07-06 (×3): qty 3

## 2023-07-06 MED ORDER — CLOPIDOGREL BISULFATE 75 MG PO TABS
75.0000 mg | ORAL_TABLET | Freq: Every day | ORAL | Status: DC
  Administered 2023-07-06 – 2023-07-07 (×2): 75 mg via ORAL
  Filled 2023-07-06 (×2): qty 1

## 2023-07-06 MED ORDER — ATORVASTATIN CALCIUM 40 MG PO TABS
80.0000 mg | ORAL_TABLET | Freq: Every day | ORAL | Status: DC
  Administered 2023-07-06 – 2023-07-07 (×2): 80 mg via ORAL
  Filled 2023-07-06 (×2): qty 2

## 2023-07-06 MED ORDER — MELATONIN 3 MG PO TABS: 3.0000 mg | ORAL_TABLET | Freq: Every evening | ORAL | Status: DC | PRN

## 2023-07-06 MED ORDER — BACLOFEN 10 MG PO TABS
10.0000 mg | ORAL_TABLET | Freq: Three times a day (TID) | ORAL | Status: DC
  Administered 2023-07-06 – 2023-07-07 (×5): 10 mg via ORAL
  Filled 2023-07-06 (×5): qty 1

## 2023-07-06 MED ORDER — ACETAMINOPHEN 325 MG PO TABS
650.0000 mg | ORAL_TABLET | Freq: Four times a day (QID) | ORAL | Status: DC | PRN
  Administered 2023-07-06: 650 mg via ORAL
  Filled 2023-07-06: qty 2

## 2023-07-06 MED ORDER — ONDANSETRON HCL 4 MG PO TABS: 4.0000 mg | ORAL_TABLET | Freq: Four times a day (QID) | ORAL | Status: DC | PRN

## 2023-07-06 MED ORDER — WARFARIN - PHARMACIST DOSING INPATIENT: Freq: Every day | Status: DC

## 2023-07-06 MED ORDER — EZETIMIBE 10 MG PO TABS
10.0000 mg | ORAL_TABLET | Freq: Every day | ORAL | Status: DC
  Administered 2023-07-06 – 2023-07-07 (×2): 10 mg via ORAL
  Filled 2023-07-06 (×2): qty 1

## 2023-07-06 NOTE — H&P (Signed)
 History and Physical  [name] Good EXB:[id number] DOB: [birth year] DOA: 07/05/2023  PCP: Ardith Dark, MD   Chief Complaint: Elevated INR  HPI: [name] Good is a 73 y.o. 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  She has a history of multiple clots about 6 years apart, was on Eliquis for several years.  Most recently about 3 months ago her insurance no longer wanted to pay for her Eliquis, and in the meantime she also had outpatient hematology consultation with Dr. Al Pimple in October 2024.  At that consultation, she had high suspicion for antiphospholipid antibody syndrome and recommended that Coumadin would be superior to DOAC.  As such, the patient was started in approximately January on Coumadin.  She seems to have been tolerating this well, but in the last 4 months she was also started on methotrexate due to her arthritis.  Her arthritis symptoms have started to improve on the methotrexate, but yesterday she was found to have supratherapeutic INR and sent to the ER for evaluation.  She denies any bruising or bleeding complications.  She received 10 mg IV vitamin K in the emergency department and this morning her INR is improved from greater than 10 to 1.7.  Review of Systems: Please see HPI for pertinent positives and negatives. A complete 10 system review of systems are otherwise negative.  Past Medical History:  Diagnosis Date   Anxiety    Arthritis    Depression    GERD (gastroesophageal reflux disease)    Myocardial infarction (HCC) 89   no visits to cardiac dr(thomas kelly) since 89   Stroke (HCC)    Wound dehiscence    lumbar   Past Surgical History:  Procedure Laterality Date   APPENDECTOMY     17 yrs   BACK SURGERY     BREAST SURGERY Left    cyst   BUBBLE STUDY  03/12/2020   Procedure: BUBBLE STUDY;  Surgeon: Orpah Cobb, MD;  Location: [facility]  ENDOSCOPY;  Service: Cardiovascular;;   CARDIAC CATHETERIZATION  65   CHOLECYSTECTOMY     73 yrs old   LUMBAR LAMINECTOMY/DECOMPRESSION MICRODISCECTOMY N/A 06/14/2015   Procedure: Lumbar Three-Four,Lumbar Four-Five, Lumbar Five-Sacral One Laminectomy;  Surgeon: Tia Alert, MD;  Location: [facility] NEURO ORS;  Service: Neurosurgery;  Laterality: N/A;   LUMBAR WOUND DEBRIDEMENT N/A 07/24/2015   Procedure: lumbar wound revision;  Surgeon: Tia Alert, MD;  Location: [facility] NEURO ORS;  Service: Neurosurgery;  Laterality: N/A;   TEE WITHOUT CARDIOVERSION N/A 03/12/2020   Procedure: TRANSESOPHAGEAL ECHOCARDIOGRAM (TEE);  Surgeon: Orpah Cobb, MD;  Location: [facility] ENDOSCOPY;  Service: Cardiovascular;  Laterality: N/A;   TOTAL HIP ARTHROPLASTY Right 10/25/2015   Procedure: RIGHT TOTAL HIP ARTHROPLASTY ANTERIOR APPROACH;  Surgeon: Kathryne Hitch, MD;  Location: [facility] ORS;  Service: Orthopedics;  Laterality: Right;   TUBAL LIGATION     Social History:  reports that she quit smoking about 10 years ago. Her smoking use included cigarettes. She started smoking about 50 years ago. She has a 80 pack-year smoking history. She has never used smokeless tobacco. She reports that she does not currently use alcohol. She reports that she does not use drugs.  Allergies  Allergen Reactions   Amoxicillin Other (See Comments)    Tolerated Zosyn Has patient had a PCN reaction causing immediate rash, facial/tongue/throat swelling, SOB or lightheadedness with hypotension: No Has patient had a PCN reaction  causing severe rash involving mucus membranes or skin necrosis: No Has patient had a PCN reaction that required hospitalization No Has patient had a PCN reaction occurring within the last 10 years: No If all of the above answers are "NO", then may proceed with Cephalosporin use.    Family History  Problem Relation Age of Onset   Stroke Mother    Clotting disorder Neg Hx      Prior to Admission medications    Medication Sig Start Date End Date Taking? Authorizing Provider  atorvastatin (LIPITOR) 80 MG tablet Take 1 tablet (80 mg total) by mouth daily. 12/29/22   Ardith Dark, MD  baclofen (LIORESAL) 10 MG tablet TAKE 1 TABLET BY MOUTH 3 TIMES DAILY 10/19/22   Ardith Dark, MD  calcium carbonate (TUMS EX) 750 MG chewable tablet Chew 1 tablet by mouth 2 (two) times daily as needed for heartburn.    [provider]  clopidogrel (PLAVIX) 75 MG tablet TAKE 1 TABLET BY MOUTH DAILY 07/01/23   Corky Crafts, MD  diphenhydrAMINE (BENADRYL) 25 mg capsule Take 25 mg by mouth every 6 (six) hours as needed for sleep or allergies.    [provider]  docusate sodium (COLACE) 100 MG capsule Take 100 mg by mouth 2 (two) times daily.    [provider]  enoxaparin (LOVENOX) 120 MG/0.8ML injection Inject 0.8 mLs (120 mg total) into the skin daily. TAKE AS DIRECTED BY ANTICOAGULATION CLINIC 06/29/23   Ardith Dark, MD  escitalopram (LEXAPRO) 20 MG tablet TAKE 1 TABLET BY MOUTH DAILY 03/29/23   Ardith Dark, MD  ezetimibe (ZETIA) 10 MG tablet TAKE 1 TABLET BY MOUTH DAILY 05/07/23   Ardith Dark, MD  famotidine (PEPCID) 20 MG tablet Take 20 mg by mouth daily as needed for heartburn or indigestion.    [provider]  folic acid (FOLVITE) 1 MG tablet Take 1 mg by mouth daily.    [provider]  methotrexate (RHEUMATREX) 2.5 MG tablet Take 2.5 mg by mouth once a week. Caution:Chemotherapy. Protect from light.    [provider]  ondansetron (ZOFRAN) 4 MG tablet Take 1 tablet (4 mg total) by mouth every 8 (eight) hours as needed for nausea or vomiting. 12/01/22   Ardith Dark, MD  oxyCODONE-acetaminophen (PERCOCET) 10-325 MG tablet TAKE 1 TABLET BY MOUTH 5 TIMES DAILY 06/18/23   Ardith Dark, MD  phentermine 37.5 MG capsule Take 1 capsule (37.5 mg total) by mouth every morning. 04/08/23   Ardith Dark, MD  predniSONE (DELTASONE) 5 MG tablet TAKE 1 TABLET  BY MOUTH DAILY 11/02/22   Ardith Dark, MD  rOPINIRole (REQUIP) 3 MG tablet TAKE 1 TABLET BY MOUTH AT BEDTIME AS NEEDED Patient taking differently: Take 3 mg by mouth at bedtime. 09/23/22   Ardith Dark, MD  Turmeric (QC TUMERIC COMPLEX PO) Take by mouth.    [provider]  warfarin (COUMADIN) 5 MG tablet Take 1 tablet (5 mg total) by mouth daily. 06/24/23   Ardith Dark, MD    Physical Exam: BP (!) 131/55 (BP Location: Right Arm)   Pulse 83   Temp 98.1 F (36.7 C) (Oral)   Resp 19   Ht 5\' 2"  (1.575 m)   Wt 89 kg   SpO2 99%   BMI 35.87 kg/m  General:  Alert, oriented, calm, in no acute distress  Eyes: EOMI, clear conjuctivae, white sclerea Neck: supple, no masses, trachea mildline  Cardiovascular:  RRR, no murmurs or rubs, no peripheral edema  Respiratory: clear to auscultation bilaterally, no wheezes, no crackles  Abdomen: soft, nontender, nondistended, normal bowel tones heard  Skin: dry, no rashes  Musculoskeletal: no joint effusions, normal range of motion  Psychiatric: appropriate affect, normal speech  Neurologic: extraocular muscles intact, clear speech, moving all extremities with intact sensorium         Labs on Admission:  Basic Metabolic Panel: Recent Labs  Lab 07/05/23 2037 07/06/23 0539  NA 137 139  K 3.7 4.0  CL 104 107  CO2 28 27  GLUCOSE 71 99  BUN 20 18  CREATININE 0.87 0.78  CALCIUM 9.2 8.9  MG  --  2.1   Liver Function Tests: Recent Labs  Lab 07/06/23 0539  AST 42*  ALT 32  ALKPHOS 75  BILITOT 0.6  PROT 6.0*  ALBUMIN 3.0*   No results for input(s): "LIPASE", "AMYLASE" in the last 168 hours. No results for input(s): "AMMONIA" in the last 168 hours. CBC: Recent Labs  Lab 07/05/23 2037 07/06/23 0539  WBC 6.3 6.0  NEUTROABS 3.2 3.0  HGB 10.7* 10.5*  HCT 32.6* 33.3*  MCV 97.0 100.6*  PLT 239 232   Cardiac Enzymes: No results for input(s): "CKTOTAL", "CKMB", "CKMBINDEX", "TROPONINI" in the last 168 hours. BNP (last 3  results) No results for input(s): "BNP" in the last 8760 hours.  ProBNP (last 3 results) No results for input(s): "PROBNP" in the last 8760 hours.  CBG: No results for input(s): "GLUCAP" in the last 168 hours.  Radiological Exams on Admission: No results found.  Assessment/Plan [name] Good is a 73 y.o. 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  History of SMV thrombosis and supratherapeutic INR-I suspect this is due to recent initiation of methotrexate.  INR has been reversed, is now 1.7.  Will need optimization of her INR prior to discharge. -Observation admission -She had recent hematology consultation which confirms Coumadin is superior to DOAC in her situation -Will bridge with Lovenox -Resume Coumadin per pharmacy consult, will need dose adjustment due to methotrexate -Check INR daily  History of CVA-continue Plavix  Autoimmune arthritis-continue prednisone 5 mg p.o. daily, methotrexate weekly  Restless legs-continue baclofen and Requip  Depression-continue Lexapro  Hyperlipidemia-continue Zetia and Lipitor    Code Status: Full Code  Consults called: None  Admission status: Observation   Time spent: 48 minutes  Morell Mears Sharlette Dense MD [facility] Pager [phone]  If 7PM-7AM, please contact night-coverage [email or web address] Password [facility]  07/06/2023, 10:05 AM

## 2023-07-06 NOTE — Progress Notes (Signed)
   07/06/23 1458  TOC Brief Assessment  Insurance and Status Reviewed  Patient has primary care physician Yes  Home environment has been reviewed Lives home with husband  Prior level of function: Independent  Prior/Current Home Services No current home services  Transition of care needs no transition of care needs at this time

## 2023-07-06 NOTE — Progress Notes (Signed)
 PHARMACY - ANTICOAGULATION CONSULT NOTE  Pharmacy Consult for warfarin Indication: mesenteric thrombosis  Allergies  Allergen Reactions   Amoxicillin Other (See Comments)    Tolerated Zosyn Has patient had a PCN reaction causing immediate rash, facial/tongue/throat swelling, SOB or lightheadedness with hypotension: No Has patient had a PCN reaction causing severe rash involving mucus membranes or skin necrosis: No Has patient had a PCN reaction that required hospitalization No Has patient had a PCN reaction occurring within the last 10 years: No If all of the above answers are "NO", then may proceed with Cephalosporin use.    Patient Measurements: Height: 5\' 2"  (157.5 cm) Weight: 89 kg (196 lb 1.6 oz) IBW/kg (Calculated) : 50.1 Heparin Dosing Weight:   Vital Signs: Temp: 98.1 F (36.7 C) (03/11 0728) Temp Source: Oral (03/11 0728) BP: 131/55 (03/11 0728) Pulse Rate: 83 (03/11 0728)  Labs: Recent Labs    07/05/23 1443 07/05/23 2037 07/06/23 0539  HGB  --  10.7* 10.5*  HCT  --  32.6* 33.3*  PLT  --  239 232  APTT  --   --  37*  LABPROT 120.0* 74.2* 20.0*  INR >10.0* 9.1* 1.7*  CREATININE  --  0.87 0.78    Estimated Creatinine Clearance: 64 mL/min (by C-G formula based on SCr of 0.78 mg/dL).   Medical History: Past Medical History:  Diagnosis Date   Anxiety    Arthritis    Depression    GERD (gastroesophageal reflux disease)    Myocardial infarction (HCC) 89   no visits to cardiac dr(thomas kelly) since 92   Stroke (HCC)    Wound dehiscence    lumbar    Medications:  Medications Prior to Admission  Medication Sig Dispense Refill Last Dose/Taking   atorvastatin (LIPITOR) 80 MG tablet Take 1 tablet (80 mg total) by mouth daily. 90 tablet 3    baclofen (LIORESAL) 10 MG tablet TAKE 1 TABLET BY MOUTH 3 TIMES DAILY 30 tablet 5    calcium carbonate (TUMS EX) 750 MG chewable tablet Chew 1 tablet by mouth 2 (two) times daily as needed for heartburn.       clopidogrel (PLAVIX) 75 MG tablet TAKE 1 TABLET BY MOUTH DAILY 15 tablet 0    diphenhydrAMINE (BENADRYL) 25 mg capsule Take 25 mg by mouth every 6 (six) hours as needed for sleep or allergies.      docusate sodium (COLACE) 100 MG capsule Take 100 mg by mouth 2 (two) times daily.      enoxaparin (LOVENOX) 120 MG/0.8ML injection Inject 0.8 mLs (120 mg total) into the skin daily. TAKE AS DIRECTED BY ANTICOAGULATION CLINIC 5.6 mL 0    escitalopram (LEXAPRO) 20 MG tablet TAKE 1 TABLET BY MOUTH DAILY 90 tablet 3    ezetimibe (ZETIA) 10 MG tablet TAKE 1 TABLET BY MOUTH DAILY 30 tablet 3    famotidine (PEPCID) 20 MG tablet Take 20 mg by mouth daily as needed for heartburn or indigestion.      folic acid (FOLVITE) 1 MG tablet Take 1 mg by mouth daily.      methotrexate (RHEUMATREX) 2.5 MG tablet Take 2.5 mg by mouth once a week. Caution:Chemotherapy. Protect from light.      ondansetron (ZOFRAN) 4 MG tablet Take 1 tablet (4 mg total) by mouth every 8 (eight) hours as needed for nausea or vomiting. 20 tablet 0    oxyCODONE-acetaminophen (PERCOCET) 10-325 MG tablet TAKE 1 TABLET BY MOUTH 5 TIMES DAILY 150 tablet 0    phentermine  37.5 MG capsule Take 1 capsule (37.5 mg total) by mouth every morning. 30 capsule 2    predniSONE (DELTASONE) 5 MG tablet TAKE 1 TABLET BY MOUTH DAILY 90 tablet 3    rOPINIRole (REQUIP) 3 MG tablet TAKE 1 TABLET BY MOUTH AT BEDTIME AS NEEDED (Patient taking differently: Take 3 mg by mouth at bedtime.) 90 tablet 3    Turmeric (QC TUMERIC COMPLEX PO) Take by mouth.      warfarin (COUMADIN) 5 MG tablet Take 1 tablet (5 mg total) by mouth daily. 30 tablet 0     Assessment: 73 yo F on warfarin & LMWH PTA for mesenteric thrombosis Recently transitioned off Eliquis to warfarin b/c of insurance issues. LD Eliquis 3/3 PTA warfarin managed by [facility] coumadin clinic. Per notes she started warfarin 3/4 but did not start LMWH b/c she did not pick it up from the pharmacy 3/10 INR > 10,  no bleeding noted. given vitamin K 10 mg IV @ 2102 3/11 INR 1.7> MD ordered  PTA LMWH 120 sq q24 Hg 10.5  low but stable, PLT WNL  No bleeding reported Goal of Therapy:  INR 2-3 Monitor platelets by anticoagulation protocol: Yes   Plan:  Lovenox 120 mg q24 per MD Warfarin 5 mg po x 1 dose Daily INR  Herby Abraham, Pharm.D Use secure chat for questions 07/06/2023 10:10 AM

## 2023-07-06 NOTE — Plan of Care (Signed)

## 2023-07-06 NOTE — Plan of Care (Signed)
   Problem: Education: Goal: Knowledge of General Education information will improve Description: Including pain rating scale, medication(s)/side effects and non-pharmacologic comfort measures Outcome: Progressing   Problem: Clinical Measurements: Goal: Ability to maintain clinical measurements within normal limits will improve Outcome: Progressing Goal: Diagnostic test results will improve Outcome: Progressing

## 2023-07-06 NOTE — TOC CM/SW Note (Signed)
 Transition of Care [facility]) - Inpatient Brief Assessment   Patient Details  Name: [name] Good MRN: [id number] Date of Birth: 08/30/1950  [facility]) CM/SW Contact:    Jessie Foot, RN Phone Number: 07/06/2023, 3:00 PM   Clinical Narrative: Presented for elevated INR from home in a single family house with husband. PTA was independent, does not drive,or active HH. DME - cane, walker, & shower chair. Patient has PCP and insurance. Patients husband and daughter transport to appointments. Transportation at discharge will be daughter via private vehicle. No needs identified,  Nurse Case Manager will not continue to follow.   Transition of Care Asessment: Insurance and Status: Insurance coverage has been reviewed Patient has primary care physician: Yes Home environment has been reviewed: Lives home with husband Prior level of function:: Independent Prior/Current Home Services: No current home services     Transition of care needs: no transition of care needs at this time

## 2023-07-06 NOTE — Progress Notes (Signed)
(  [place] admission to the Day Admitter; accepted by Dr.  Margo Aye as transfer from  [facility]  to a  med-surg bed at  [facility]  for supratherapeutic INR. Please see Dr.  Scharlene Gloss transfer documentation in [facility] Communication for additional details).   Briefly, this is a 73 year old female who is on warfarin in the setting of a history of mesenteric thrombosis.  Outpatient labs performed by PCP showed incidental elevation in INR to 10.  No report of any associated active bleed.  Repeat INR at [facility] was noted to be 9.1.  At [facility], she received vitamin K 10 mg IV x 1 dose.   I have placed some additional preliminary admit orders via the adult multi-morbid admission order set. I have also ordered repeat INR in the morning.  Holding home warfarin for now.  I have also ordered additional morning labs include CMP, CBC, magnesium level and PTT.    Newton Pigg, DO Hospitalist

## 2023-07-07 DIAGNOSIS — R791 Abnormal coagulation profile: Secondary | ICD-10-CM | POA: Diagnosis not present

## 2023-07-07 LAB — CBC
HCT: 35.4 % — ABNORMAL LOW (ref 36.0–46.0)
Hemoglobin: 11.2 g/dL — ABNORMAL LOW (ref 12.0–15.0)
MCH: 31.6 pg (ref 26.0–34.0)
MCHC: 31.6 g/dL (ref 30.0–36.0)
MCV: 100 fL (ref 80.0–100.0)
Platelets: 236 10*3/uL (ref 150–400)
RBC: 3.54 MIL/uL — ABNORMAL LOW (ref 3.87–5.11)
RDW: 15.5 % (ref 11.5–15.5)
WBC: 6.3 10*3/uL (ref 4.0–10.5)
nRBC: 0 % (ref 0.0–0.2)

## 2023-07-07 LAB — BASIC METABOLIC PANEL
Anion gap: 8 (ref 5–15)
BUN: 14 mg/dL (ref 8–23)
CO2: 25 mmol/L (ref 22–32)
Calcium: 9.1 mg/dL (ref 8.9–10.3)
Chloride: 109 mmol/L (ref 98–111)
Creatinine, Ser: 0.77 mg/dL (ref 0.44–1.00)
GFR, Estimated: >60 mL/min (ref >60)
Glucose, Bld: 92 mg/dL (ref 70–99)
Potassium: 4.1 mmol/L (ref 3.5–5.1)
Sodium: 142 mmol/L (ref 135–145)

## 2023-07-07 LAB — PROTIME-INR
INR: 1.2 (ref 0.8–1.2)
Prothrombin Time: 15.5 s — ABNORMAL HIGH (ref 11.4–15.2)

## 2023-07-07 MED ORDER — WARFARIN SODIUM 5 MG PO TABS
5.0000 mg | ORAL_TABLET | Freq: Once | ORAL | Status: AC
  Administered 2023-07-07: 5 mg via ORAL
  Filled 2023-07-07: qty 1

## 2023-07-07 MED ORDER — WARFARIN SODIUM 5 MG PO TABS: 2.5000 mg | ORAL_TABLET | Freq: Every day | ORAL | 0 refills | Status: DC

## 2023-07-07 NOTE — Progress Notes (Addendum)
 [name] Good   DOB:1950-08-21   ZH#:[id number]      ASSESSMENT & PLAN:   Supratherapeutic INR History of superior mesenteric venous thrombosis, 2024 History of thrombus, 2021 - Suspicion for antiphospholipid syndrome-patient had persistent elevation of antiphospholipid cardiolipin IgM antibodies on 2 occasions 6 years apart.  Repeat hypercoagulable workup was done October 2024. - Patient sent to the ER for evaluation. - Was supposed to start Coumadin in January 2025.  However patient states she only started it 1 to 2 weeks ago. - Indefinite anticoagulation recommended as patient has had 2 episodes of VTE.  Coumadin superior to DOAC in this setting. - Target INR 2-3.  INR was >10.0 on admission 07/05/2023.  Today noted to be 1.2.  Needs ongoing dose adjustment, pharmacy consulted. - Follows with hematology/Dr. Al Pimple  Autoimmune arthritis - On prednisone 5 mg p.o. daily - Has been on methotrexate weekly. - Continue close follow-up with outpatient rheumatology  History of CVA - Continue Plavix  Anemia, normocytic - Mild - Hemoglobin 11.2 today - Monitor CBC with differential    Code Status Full  Subjective:  Patient seen awake and alert laying supine in bed.  Reports that she did not start Coumadin in January as she was instructed.  States she started it 1 to 2 weeks ago and is surprised at what is going on.  Discussed importance of Coumadin clinic follow-up.  No bleeding, no other acute symptoms noted.  Objective:  Vitals:   07/06/23 1949 07/07/23 0454  BP: 138/79 (!) 146/74  Pulse: 85 83  Resp: 18 18  Temp: 97.9 F (36.6 C) (!) 97.5 F (36.4 C)  SpO2: 100% 99%     Intake/Output Summary (Last 24 hours) at 07/07/2023 1113 Last data filed at 07/07/2023 6295 Gross per 24 hour  Intake 240 ml  Output 2600 ml  Net -2360 ml     REVIEW OF SYSTEMS:   Constitutional: Denies fevers, chills or abnormal night sweats Eyes: Denies blurriness of vision, double vision or watery  eyes Ears, nose, mouth, throat, and face: Denies mucositis or sore throat Respiratory: Denies cough, dyspnea or wheezes Cardiovascular: Denies palpitation, chest discomfort or lower extremity swelling Gastrointestinal:  Denies nausea, heartburn or change in bowel habits Skin: Denies abnormal skin rashes Lymphatics: Denies new lymphadenopathy or easy bruising Neurological: Denies numbness, tingling or new weaknesses Behavioral/Psych: Mood is stable, no new changes  All other systems were reviewed with the patient and are negative.  PHYSICAL EXAMINATION: ECOG PERFORMANCE STATUS: 1 - Symptomatic but completely ambulatory  Vitals:   07/06/23 1949 07/07/23 0454  BP: 138/79 (!) 146/74  Pulse: 85 83  Resp: 18 18  Temp: 97.9 F (36.6 C) (!) 97.5 F (36.4 C)  SpO2: 100% 99%   Filed Weights   07/05/23 1922 07/05/23 2300 07/06/23 0500  Weight: 184 lb (83.5 kg) 184 lb 1.4 oz (83.5 kg) 196 lb 1.6 oz (89 kg)    GENERAL: alert, no distress and comfortable SKIN: skin color, texture, turgor are normal, no rashes or significant lesions EYES: normal, conjunctiva are pink and non-injected, sclera clear OROPHARYNX: no exudate, no erythema and lips, buccal mucosa, and tongue normal  NECK: supple, thyroid normal size, non-tender, without nodularity LYMPH: no palpable lymphadenopathy in the cervical, axillary or inguinal LUNGS: clear to auscultation and percussion with normal breathing effort HEART: regular rate & rhythm and no murmurs and no lower extremity edema ABDOMEN: abdomen soft, non-tender and normal bowel sounds MUSCULOSKELETAL: no cyanosis of digits and no clubbing  PSYCH: alert & oriented x 3 with fluent speech NEURO: no focal motor/sensory deficits   All questions were answered. The patient knows to call the clinic with any problems, questions or concerns.   The total time spent in the appointment was 40 minutes encounter with patient including review of chart and various tests  results, discussions about plan of care and coordination of care plan  Dawson Bills, NP 07/07/2023 11:13 AM    Labs Reviewed:  Lab Results  Component Value Date   WBC 6.3 07/07/2023   HGB 11.2 (L) 07/07/2023   HCT 35.4 (L) 07/07/2023   MCV 100.0 07/07/2023   PLT 236 07/07/2023   Recent Labs    12/11/22 0359 01/04/23 1430 07/05/23 2037 07/06/23 0539 07/07/23 0611  NA 133* 138 137 139 142  K 3.1* 4.2 3.7 4.0 4.1  CL 99 104 104 107 109  CO2 26 26 28 27 25   GLUCOSE 108* 94 71 99 92  BUN 13 15 20 18 14   CREATININE 0.97 0.81 0.87 0.78 0.77  CALCIUM 8.2* 8.9 9.2 8.9 9.1  GFRNONAA >60  --  >60 >60 >60  PROT 6.0* 7.1  --  6.0*  --   ALBUMIN 2.4* 3.3*  --  3.0*  --   AST 64* 33  --  42*  --   ALT 29 25  --  32  --   ALKPHOS 100 93  --  75  --   BILITOT 0.5 0.4  --  0.6  --     Studies Reviewed:  XR Knee 1-2 Views Right Result Date: 06/09/2023 X-rays of the right knee show tricompartment arthritis with valgus malalignment.  XR Knee 1-2 Views Left Result Date: 06/09/2023 X-rays of the left knee show severe tricompartment arthritis with valgus malalignment and bone-on-bone wear of the lateral compartment and patellofemoral joint.  There are large osteophytes around the knee.  Attending Note  I personally saw the patient, reviewed the chart and examined the patient. The plan of care was discussed with the patient and the admitting team. I agree with the assessment and plan as documented above. Thank you very much for the consultation. She had at least 2 episodes of VTE and persistently pos anti cardiolipin ab, hence we recommended warfarin over DOAC for anticoagulation. She should follow up with warfarin clinic for titration of INR to therapeutic range. She can follow up with hematology as needed.

## 2023-07-07 NOTE — Progress Notes (Signed)
 PHARMACY - ANTICOAGULATION CONSULT NOTE  Pharmacy Consult for warfarin Indication: mesenteric thrombosis  Allergies  Allergen Reactions   Amoxicillin Other (See Comments)    Tolerated Zosyn Has patient had a PCN reaction causing immediate rash, facial/tongue/throat swelling, SOB or lightheadedness with hypotension: No Has patient had a PCN reaction causing severe rash involving mucus membranes or skin necrosis: No Has patient had a PCN reaction that required hospitalization No Has patient had a PCN reaction occurring within the last 10 years: No If all of the above answers are "NO", then may proceed with Cephalosporin use.    Patient Measurements: Height: 5\' 2"  (157.5 cm) Weight: 89 kg (196 lb 1.6 oz) IBW/kg (Calculated) : 50.1 Heparin Dosing Weight:   Vital Signs: Temp: 97.5 F (36.4 C) (03/12 0454) Temp Source: Oral (03/12 0454) BP: 146/74 (03/12 0454) Pulse Rate: 83 (03/12 0454)  Labs: Recent Labs    07/05/23 2037 07/06/23 0539 07/07/23 0611  HGB 10.7* 10.5* 11.2*  HCT 32.6* 33.3* 35.4*  PLT 239 232 236  APTT  --  37*  --   LABPROT 74.2* 20.0* 15.5*  INR 9.1* 1.7* 1.2  CREATININE 0.87 0.78 0.77    Estimated Creatinine Clearance: 64 mL/min (by C-G formula based on SCr of 0.77 mg/dL).   Medical History: Past Medical History:  Diagnosis Date   Anxiety    Arthritis    Depression    GERD (gastroesophageal reflux disease)    Myocardial infarction (HCC) 89   no visits to cardiac dr(thomas kelly) since 89   Stroke (HCC)    Wound dehiscence    lumbar    Medications:  Medications Prior to Admission  Medication Sig Dispense Refill Last Dose/Taking   atorvastatin (LIPITOR) 80 MG tablet Take 1 tablet (80 mg total) by mouth daily. 90 tablet 3 07/04/2023   baclofen (LIORESAL) 10 MG tablet TAKE 1 TABLET BY MOUTH 3 TIMES DAILY (Patient taking differently: Take 10 mg by mouth 3 (three) times daily as needed for muscle spasms.) 30 tablet 5 Past Week   calcium carbonate  (TUMS EX) 750 MG chewable tablet Chew 1 tablet by mouth 2 (two) times daily as needed for heartburn.   Taking As Needed   clopidogrel (PLAVIX) 75 MG tablet TAKE 1 TABLET BY MOUTH DAILY 15 tablet 0 07/04/2023   diphenhydrAMINE (BENADRYL) 25 mg capsule Take 25 mg by mouth every 6 (six) hours as needed for sleep or allergies.   07/04/2023   docusate sodium (COLACE) 100 MG capsule Take 100 mg by mouth 2 (two) times daily.   Past Week   escitalopram (LEXAPRO) 20 MG tablet TAKE 1 TABLET BY MOUTH DAILY 90 tablet 3 07/04/2023   ezetimibe (ZETIA) 10 MG tablet TAKE 1 TABLET BY MOUTH DAILY 30 tablet 3 07/04/2023   famotidine (PEPCID) 20 MG tablet Take 20 mg by mouth daily as needed for heartburn or indigestion.   Past Month   folic acid (FOLVITE) 1 MG tablet Take 1 mg by mouth daily.   07/04/2023   methotrexate (RHEUMATREX) 2.5 MG tablet Take 2.5 mg by mouth once a week. Caution:Chemotherapy. Protect from light.   Past Week   ondansetron (ZOFRAN) 4 MG tablet Take 1 tablet (4 mg total) by mouth every 8 (eight) hours as needed for nausea or vomiting. 20 tablet 0 Taking As Needed   oxyCODONE-acetaminophen (PERCOCET) 10-325 MG tablet TAKE 1 TABLET BY MOUTH 5 TIMES DAILY (Patient taking differently: Take 1 tablet by mouth every 4 (four) hours as needed for pain. TAKE  1 TABLET BY MOUTH 5 TIMES DAILY) 150 tablet 0 07/05/2023   phentermine 37.5 MG capsule Take 1 capsule (37.5 mg total) by mouth every morning. 30 capsule 2 07/04/2023   predniSONE (DELTASONE) 5 MG tablet TAKE 1 TABLET BY MOUTH DAILY 90 tablet 3 Past Week   rOPINIRole (REQUIP) 3 MG tablet TAKE 1 TABLET BY MOUTH AT BEDTIME AS NEEDED (Patient taking differently: Take 3 mg by mouth at bedtime.) 90 tablet 3 07/05/2023   warfarin (COUMADIN) 5 MG tablet Take 1 tablet (5 mg total) by mouth daily. 30 tablet 0 07/04/2023   enoxaparin (LOVENOX) 120 MG/0.8ML injection Inject 0.8 mLs (120 mg total) into the skin daily. TAKE AS DIRECTED BY ANTICOAGULATION CLINIC (Patient not taking:  Reported on 07/06/2023) 5.6 mL 0 Not Taking   Turmeric (QC TUMERIC COMPLEX PO) Take by mouth.       Assessment: 73 yo F on warfarin & LMWH PTA for mesenteric thrombosis Recently transitioned off Eliquis to warfarin b/c of insurance issues. LD Eliquis 3/3 PTA warfarin managed by [facility] coumadin clinic. Per notes she started warfarin 3/4 but did not start LMWH b/c she did not pick it up from the pharmacy - 3/10 INR > 10, no bleeding noted. given vitamin K 10 mg IV @ 2102 - 3/11 INR 1.7> MD ordered  PTA LMWH 120 sq q24  Today 07/07/2023: - INR 1.2 - subtherapeutic after 5 mg warfarin dose given yesterday. - Low INR reflects 10 mg IV vit K given 3/10.   - Hg 11.2 low but up from 10.5 yesterday. PLTs WNL  - No bleeding reported  Goal of Therapy:  INR 2-3 Monitor platelets by anticoagulation protocol: Yes   Plan:  Lovenox 120 mg q24 per MD Warfarin 5 mg po x 1 dose Daily INR  Adolphus Birchwood, Student-PharmD 07/07/2023 9:25 AM

## 2023-07-07 NOTE — Care Management Obs Status (Signed)
 MEDICARE OBSERVATION STATUS NOTIFICATION   Patient Details  Name: [name] Good MRN: [id number] Date of Birth: 07-09-1950   Medicare Observation Status Notification Given:  Yes    Otelia Santee, LCSW 07/07/2023, 9:51 AM

## 2023-07-07 NOTE — Plan of Care (Signed)

## 2023-07-07 NOTE — Discharge Summary (Signed)
 Physician Discharge Summary  [name] Good ZOX:[id number] DOB: 20-Jan-1951 DOA: 07/05/2023  PCP: Ardith Dark, MD  Admit date: 07/05/2023 Discharge date: 07/07/2023 Discharging to: home F/u: INR in 3 days  Consults:  Hematology      Discharge Diagnoses:   Principal Problem:   Supratherapeutic INR Active Problems:   History of CVA (cerebrovascular accident)   Autoimmune disease (HCC)   Obesity (BMI 35.0-39.9 without comorbidity)     Hospital Course:  This is a 73 year old female with history of CVA, SMV thrombosis, GERD.  She she recently started Coumadin about 1 to 2 weeks ago and was noted to have a severely elevated INR when she went to get it checked at the Coumadin clinic.  She was therefore referred for admission to the hospital.  In the ED she was found to have an INR of 9.1.  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           Discharge Instructions  Discharge Instructions     Diet - low sodium heart healthy   Complete by: As directed    Increase activity slowly   Complete by: As directed       Allergies as of 07/07/2023       Reactions   Amoxicillin Other (See Comments)   Tolerated Zosyn Has patient had a PCN reaction causing immediate rash, facial/tongue/throat swelling, SOB or lightheadedness with hypotension: No Has patient had a PCN reaction causing severe rash involving mucus membranes or skin necrosis: No Has patient had a PCN reaction that required hospitalization No Has patient had a PCN reaction occurring within the last 10 years: No If all of the above answers are "NO", then may proceed  with Cephalosporin use.        Medication List     STOP taking these medications    enoxaparin 120 MG/0.8ML injection Commonly known as: LOVENOX       TAKE these medications    atorvastatin 80 MG tablet Commonly known as: LIPITOR Take 1 tablet (80 mg total) by mouth daily.   baclofen 10 MG tablet Commonly known as: LIORESAL TAKE 1 TABLET BY MOUTH 3 TIMES DAILY What changed:  when to take this reasons to take this   calcium carbonate 750 MG chewable tablet Commonly known as: TUMS EX Chew 1 tablet by mouth 2 (two) times daily as needed for heartburn.   clopidogrel 75 MG tablet Commonly known as: PLAVIX TAKE 1 TABLET BY MOUTH DAILY   diphenhydrAMINE 25 mg capsule Commonly known as: BENADRYL Take 25 mg by mouth every 6 (six) hours as needed for sleep or allergies.   docusate sodium 100 MG capsule Commonly known as: COLACE Take 100 mg by mouth 2 (two) times daily.   escitalopram 20 MG tablet Commonly known as: LEXAPRO TAKE 1 TABLET BY MOUTH DAILY   ezetimibe 10 MG tablet Commonly known as: ZETIA TAKE 1 TABLET BY MOUTH DAILY   famotidine 20 MG tablet Commonly known as: PEPCID Take 20 mg by mouth daily as needed for heartburn or indigestion.   folic acid 1 MG tablet Commonly known as: FOLVITE Take 1 mg by mouth daily.  methotrexate 2.5 MG tablet Commonly known as: RHEUMATREX Take 2.5 mg by mouth once a week. Caution:Chemotherapy. Protect from light.   ondansetron 4 MG tablet Commonly known as: Zofran Take 1 tablet (4 mg total) by mouth every 8 (eight) hours as needed for nausea or vomiting.   oxyCODONE-acetaminophen 10-325 MG tablet Commonly known as: PERCOCET TAKE 1 TABLET BY MOUTH 5 TIMES DAILY What changed: See the new instructions.   phentermine 37.5 MG capsule Take 1 capsule (37.5 mg total) by mouth every morning.   predniSONE 5 MG tablet Commonly known as: DELTASONE TAKE 1 TABLET BY MOUTH DAILY   QC TUMERIC COMPLEX PO Take by mouth.    rOPINIRole 3 MG tablet Commonly known as: REQUIP TAKE 1 TABLET BY MOUTH AT BEDTIME AS NEEDED What changed: when to take this   warfarin 5 MG tablet Commonly known as: COUMADIN Take as directed. If you are unsure how to take this medication, talk to your nurse or doctor. Original instructions: Take 0.5 tablets (2.5 mg total) by mouth daily. What changed: how much to take            The results of significant diagnostics from this hospitalization (including imaging, microbiology, ancillary and laboratory) are listed below for reference.    XR Knee 1-2 Views Right Result Date: 06/09/2023 X-rays of the right knee show tricompartment arthritis with valgus malalignment.  XR Knee 1-2 Views Left Result Date: 06/09/2023 X-rays of the left knee show severe tricompartment arthritis with valgus malalignment and bone-on-bone wear of the lateral compartment and patellofemoral joint.  There are large osteophytes around the knee.  Labs:   Basic Metabolic Panel: Recent Labs  Lab 07/05/23 2037 07/06/23 0539 07/07/23 0611  NA 137 139 142  K 3.7 4.0 4.1  CL 104 107 109  CO2 28 27 25   GLUCOSE 71 99 92  BUN 20 18 14   CREATININE 0.87 0.78 0.77  CALCIUM 9.2 8.9 9.1  MG  --  2.1  --      CBC: Recent Labs  Lab 07/05/23 2037 07/06/23 0539 07/07/23 0611  WBC 6.3 6.0 6.3  NEUTROABS 3.2 3.0  --   HGB 10.7* 10.5* 11.2*  HCT 32.6* 33.3* 35.4*  MCV 97.0 100.6* 100.0  PLT 239 232 236         SIGNED:   Calvert Cantor, MD  [facility] 07/07/2023, 5:44 PM

## 2023-07-07 NOTE — Discharge Instructions (Signed)
 Please have your INR checked on 07/10/23.   You were cared for by a hospitalist during your hospital stay. Please review all of you discharge paperwork on the day of discharge and be sure you have all of your prescribed medications and please read the below instructions:  Once you are discharged, your primary care physician will handle any further medical issues. Please note that NO REFILLS for any discharge medications will be authorized once you are discharged as it is imperative that you return to your primary care physician (or establish a relationship with a primary care physician if you do not have one) for your aftercare needs. Please obtain a follow up appointment with your primary care physician within 1-2 weeks of discharge. Please take all your medications with you for your next visit with your Primary MD. Please request your Primary MD to go over all Hospital Tests and Procedures, Radiological results at the follow up appointment. In some cases, there will be blood work, cultures and biopsy results pending at the time of your discharge. Please request that your primary care M.D. goes through all the records of your hospital data and follows up on these results. Please get all hospital records sent to your primary MD by signing hospital release before you go home or request your primary care doctor's office to assist with obtaining medical records.   You must read complete instructions/literature along with all the possible adverse reactions/side effects for all the medicines that have been prescribed to you. Take any new medicines after you have completely understood and accpet all the possible adverse reactions/side effects.  Please take medications as prescribed and speak with your doctor if changes are needed.   If you have smoked or chewed tobacco in the last 2 yrs please stop. Stop any regular alcohol  and or any recreational drug use. Wear Seat belts while driving.   If you had  Pneumonia at the Hospital: Please get a 2 view Chest X ray done in 6-8 weeks after hospital discharge or sooner if instructed by your Primary MD.   If you have Congestive Heart Failure: Follow a cardiac low salt diet and 1.5 lit/day fluid restriction. Please call your Cardiologist or Primary MD anytime you have any of the following symptoms:  1) 3 pound weight gain in 24 hours or 5 pounds in 1 week  2) shortness of breath, with or without a dry hacking cough  3) increasing swelling in the feet or stomach  4) if you have to sleep on extra pillows at night in order to breathe   If you have Diabetes: Check blood sugars 4 times/day- once on AM empty stomach and then before each meal. Log in all results and show them to your primary doctor at your next visit. If glucose readings are often under 60 or above 400 call your primary MD to see if medication dosages need to be adjusted   If you have Syncope (passing out) or Seizure/Convulsions/Epilepsy: Please do not drive, operate heavy machinery, participate in activities at heights or participate in high speed sports until you have seen by Primary MD or a Neurologist and advised to do so again. Per [facility] statutes, patients with seizures are not allowed to drive until they have been seizure-free for six months.  Use caution when using heavy equipment or power tools. Avoid working on ladders or at heights. Take showers instead of baths. Ensure the water temperature is not too high on the home water heater.  Do not go swimming alone. Do not lock yourself in a room alone (i.e. bathroom). When caring for infants or small children, sit down when holding, feeding, or changing them to minimize risk of injury to the child in the event you have a seizure. Maintain good sleep hygiene. Avoid alcohol.    If you had Gastrointestinal Bleeding: Please ask your Primary MD to check a complete blood count within one week of discharge or at your next visit.  Your endoscopic/colonoscopic biopsies that are pending at the time of discharge will also need to followed by your Primary MD.    Bonita Quin can reach the hospitalist office at phone [phone] or fax [phone]   If you do not have a primary care physician, you can call [phone] for a physician referral.

## 2023-07-08 ENCOUNTER — Telehealth: Payer: Self-pay

## 2023-07-08 ENCOUNTER — Telehealth: Payer: Self-pay | Admitting: Hematology and Oncology

## 2023-07-08 NOTE — Telephone Encounter (Signed)
 Pt called to report she was d/c from the hospital yesterday and was advised to f/u with coumadin clinic. She reports she was told to take 1/2 tablet (2.5 mg) daily until INR check at coumadin clinic.  Pt reports she is doing well but a little anxious about everything going on.  Advised pt we will need to check INR tomorrow. Made apt for pt at [facility]. Pt wrote down address. Pt repeated what dose of warfarin she is to take tonight. Advised if any changes to contact the coumadin clinic. Pt has direct number. Pt verbalized understanding.

## 2023-07-09 ENCOUNTER — Ambulatory Visit

## 2023-07-09 NOTE — Telephone Encounter (Signed)
 Pt's daughter, Sarahelizabeth, called to report pt was having shoulder pain and she cannot make her scheduled coumadin clinic apt today. Advised importance of close monitoring. Advised to continue hospital recommendations for warfarin dosing and take 2.5 mg daily. Advised to watch for s/s of abnormal bruising or bleeding and if any to take pt to ER.  Scheduled pt for Monday, 3/17, at 2:30 at [facility]. [place] agreed to apt and verbalized understanding.

## 2023-07-12 ENCOUNTER — Ambulatory Visit (INDEPENDENT_AMBULATORY_CARE_PROVIDER_SITE_OTHER)

## 2023-07-12 DIAGNOSIS — Z7901 Long term (current) use of anticoagulants: Secondary | ICD-10-CM | POA: Diagnosis not present

## 2023-07-12 LAB — POCT INR: INR: 2.6 (ref 2.0–3.0)

## 2023-07-12 NOTE — Progress Notes (Signed)
 Continue 1/2 tablet (2.5 mg) daily and recheck on 3/21 at [facility]. Any abnormal bruising or bleeding go to the ER. Pt and her daughter verbalized understanding.

## 2023-07-12 NOTE — Patient Instructions (Addendum)
 Pre visit review using our clinic review tool, if applicable. No additional management support is needed unless otherwise documented below in the visit note.  Continue 1/2 tablet (2.5 mg) daily and recheck on 3/21 at [facility]. Any abnormal bruising or bleeding go to the ER.

## 2023-07-15 ENCOUNTER — Other Ambulatory Visit: Payer: Self-pay | Admitting: Family Medicine

## 2023-07-15 NOTE — Telephone Encounter (Unsigned)
 Copied from CRM [id number]. Topic: Clinical - Medication Refill >> Jul 15, 2023 11:15 AM Florestine Avers wrote: Most Recent Primary Care Visit:  Provider: Sherrie George  Department: [facility]  Visit Type: COUMADIN CLINIC  Date: 07/05/2023  Medication: oxyCODONE-acetaminophen (PERCOCET) 10-325 MG tablet  Has the patient contacted their pharmacy? Yes (Agent: If no, request that the patient contact the pharmacy for the refill. If patient does not wish to contact the pharmacy document the reason why and proceed with request.) (Agent: If yes, when and what did the pharmacy advise?)  Is this the correct pharmacy for this prescription? Yes If no, delete pharmacy and type the correct one.  This is the patient's preferred pharmacy:  [facility]   Has the prescription been filled recently? Yes  Is the patient out of the medication? Yes  Has the patient been seen for an appointment in the last year OR does the patient have an upcoming appointment? Yes  Can we respond through MyChart? Yes  Agent: Please be advised that Rx refills may take up to 3 business days. We ask that you follow-up with your pharmacy.

## 2023-07-16 ENCOUNTER — Ambulatory Visit

## 2023-07-16 ENCOUNTER — Other Ambulatory Visit: Payer: Self-pay | Admitting: Family Medicine

## 2023-07-16 DIAGNOSIS — Z7901 Long term (current) use of anticoagulants: Secondary | ICD-10-CM

## 2023-07-16 LAB — POCT INR: INR: 3.5 — AB (ref 2.0–3.0)

## 2023-07-16 MED ORDER — OXYCODONE-ACETAMINOPHEN 10-325 MG PO TABS: ORAL_TABLET | ORAL | 0 refills | Status: DC

## 2023-07-16 MED ORDER — WARFARIN SODIUM 2.5 MG PO TABS: ORAL_TABLET | ORAL | 0 refills | Status: DC

## 2023-07-16 NOTE — Patient Instructions (Addendum)
 Pre visit review using our clinic review tool, if applicable. No additional management support is needed unless otherwise documented below in the visit note.  Hold warfarin today and then change weekly dose to take 1 tablets (2.5 mg) daily except take 1/2 tablet (1.25 mg) on Mondays. Recheck in 1 week at [facility]. Any abnormal bruising or bleeding go to the ER.

## 2023-07-16 NOTE — Progress Notes (Signed)
 Pt has been using a 1/2 tablet of a 5 mg tablet. Need to reduce her tablet dose today to 2.5 mg in order to provide more precise dosing. Advised pt of the change and to put the 5 mg warfarin aside and do not get mixed with the new prescription. Pt verbalized understanding.  Updated calendar using a 2.5 mg tablet. Pt denies any abnormal bruising or bleeding.  Hold warfarin today and then change weekly dose to take 1 tablets (2.5 mg) daily except take 1/2 tablet (1.25 mg) on Mondays. Recheck in 1 week at [facility]. Any abnormal bruising or bleeding go to the ER.

## 2023-07-19 ENCOUNTER — Telehealth: Payer: Self-pay | Admitting: *Deleted

## 2023-07-19 NOTE — Telephone Encounter (Signed)
 Copied from CRM [id number]. Topic: Clinical - Prescription Issue >> Jul 16, 2023  5:05 PM Denese Killings wrote: Reason for CRM: Patient states that her oxyCODONE-acetaminophen (PERCOCET) 10-325 MG tablet was sent to the wrong Pharmacy and she needs it sent to [facility].   Rx oxycodone was send to [facility] today  [facility]

## 2023-07-23 ENCOUNTER — Ambulatory Visit

## 2023-07-23 ENCOUNTER — Other Ambulatory Visit: Payer: Self-pay

## 2023-07-23 DIAGNOSIS — Z7901 Long term (current) use of anticoagulants: Secondary | ICD-10-CM | POA: Diagnosis not present

## 2023-07-23 LAB — POCT INR: INR: 2.6 (ref 2.0–3.0)

## 2023-07-23 NOTE — Telephone Encounter (Signed)
 Pt's daughter LVM reporting pt has been requesting refill of clopidogrel and PCPs office keeps rejecting the refill because they said she received the script from cardiology. Pt does not see cardiology. This medication was prescribed by a cardiologist when she was in the hospital.   Contacted pt and she reports she does not see cardiology. She did see a cardiologist in the hospital and that is where it was prescribed but she was advised to request refills from her PCP. Pt has about 5 tablets left. Advised a msg will be sent to PCP requesting refill. She would like sent to [facility] pharmacy. Advised if she did not hear from the pharmacy that she has gotten a refill by 4/2, contact the office. Pt verbalized understanding.

## 2023-07-23 NOTE — Progress Notes (Signed)
 Pt reports she forgot she was only supposed to take 1/2 tablet on Mondays. She took 1 full tablet everyday since last INR check. Since pt is in range will update calendar with that dosing and recheck on 4/8.  Calendar updated. Continue 1 tablets (2.5 mg) daily. Recheck in 2 week at [facility]. Any abnormal bruising or bleeding go to the ER.

## 2023-07-23 NOTE — Patient Instructions (Addendum)
 Pre visit review using our clinic review tool, if applicable. No additional management support is needed unless otherwise documented below in the visit note.  Continue 1 tablets (2.5 mg) daily. Recheck in 2 week at [facility].

## 2023-07-26 ENCOUNTER — Telehealth: Payer: Self-pay | Admitting: *Deleted

## 2023-07-26 NOTE — Telephone Encounter (Signed)
 Copied from CRM [id number]. Topic: Clinical - Medication Question >> Jul 23, 2023  4:04 PM Cammy Copa D wrote: Reason for CRM: PT's daughter called in for a refill of her clopidogrel (PLAVIX) 75 MG tablet, however, the medication has a note to the pharmacy stating: "Please call our office to schedule an overdue appointment with Cardiologist before anymore refills. [phone]." PT's daughter stated that no Cardiology appointment was necessary and that Ardith Dark, MD. handles all of her medications and that she would like him to call it in.   Unable to LVM voice mail is full   Medication Plavix refill need to be refill by cardiology Dr Corky Crafts, MD been managing this medication

## 2023-07-27 ENCOUNTER — Other Ambulatory Visit: Payer: Self-pay | Admitting: Interventional Cardiology

## 2023-07-27 MED ORDER — CLOPIDOGREL BISULFATE 75 MG PO TABS: 75.0000 mg | ORAL_TABLET | Freq: Every day | ORAL | 1 refills | Status: DC

## 2023-08-02 ENCOUNTER — Other Ambulatory Visit: Payer: Self-pay | Admitting: Family Medicine

## 2023-08-03 ENCOUNTER — Ambulatory Visit (INDEPENDENT_AMBULATORY_CARE_PROVIDER_SITE_OTHER)

## 2023-08-03 DIAGNOSIS — Z7901 Long term (current) use of anticoagulants: Secondary | ICD-10-CM

## 2023-08-03 LAB — POCT INR: INR: 2.2 (ref 2.0–3.0)

## 2023-08-03 MED ORDER — WARFARIN SODIUM 2.5 MG PO TABS: ORAL_TABLET | ORAL | 1 refills | Status: DC

## 2023-08-03 NOTE — Patient Instructions (Addendum)
 Pre visit review using our clinic review tool, if applicable. No additional management support is needed unless otherwise documented below in the visit note.  Continue 1 tablets (2.5 mg) daily. Recheck in 3 week at [facility].

## 2023-08-03 NOTE — Progress Notes (Cosign Needed Addendum)
 Pt reports shoulder surgery scheduled for 5/6. There is nothing in her chart scheduled for this date so she is going to f/u with ortho and then f/u with the coumadin clinic.  Continue 1 tablets (2.5 mg) daily. Recheck in 3 week at [facility].

## 2023-08-04 ENCOUNTER — Ambulatory Visit: Payer: Self-pay

## 2023-08-04 ENCOUNTER — Telehealth: Payer: Self-pay

## 2023-08-04 ENCOUNTER — Encounter: Payer: Self-pay | Admitting: Family Medicine

## 2023-08-04 DIAGNOSIS — E2839 Other primary ovarian failure: Secondary | ICD-10-CM | POA: Diagnosis not present

## 2023-08-04 DIAGNOSIS — M810 Age-related osteoporosis without current pathological fracture: Secondary | ICD-10-CM | POA: Diagnosis not present

## 2023-08-04 DIAGNOSIS — Z13820 Encounter for screening for osteoporosis: Secondary | ICD-10-CM | POA: Diagnosis not present

## 2023-08-04 DIAGNOSIS — Z1231 Encounter for screening mammogram for malignant neoplasm of breast: Secondary | ICD-10-CM | POA: Diagnosis not present

## 2023-08-04 DIAGNOSIS — Z780 Asymptomatic menopausal state: Secondary | ICD-10-CM | POA: Diagnosis not present

## 2023-08-04 LAB — HM MAMMOGRAPHY

## 2023-08-04 NOTE — Telephone Encounter (Signed)
 Dr [name] Agree with plan for Lovenox bridge. Thanks! -CMP

## 2023-08-04 NOTE — Telephone Encounter (Signed)
 Copied from CRM [id number]. Topic: Clinical - Red Word Triage >> Aug 04, 2023  2:12 PM Fonda Kinder J wrote: [facility] that prompted transfer to Nurse Triage: Both arms swollen with rashes, pt in severe pain   Chief Complaint: Arm Swelling  Symptoms: Redness, Swelling  Frequency: Acute  Pertinent Negatives: Patient denies fever  Disposition: [] ED /[] Urgent Care (no appt availability in office) / [x] Appointment(In office/virtual)/ []  [place] [facility]/ [] Home Care/ [] Refused Recommended Disposition /[] [place] [facility]/ []  Follow-up with PCP  Additional Notes: BH is being triaged for left arm swelling and redness that is accompanied by itching. The patient also complains of redness and swelling to the right hand. The patient denies any other symptoms besides the symptoms indicated in the protocol. In office appointment made for the morning.   Reason for Disposition  MODERATE arm swelling (e.g., puffiness or swollen feeling of entire arm)  Answer Assessment - Initial Assessment Questions 1. ONSET: "When did the swelling start?" (e.g., minutes, hours, days)     Yesterday  2. LOCATION: "What part of the arm is swollen?"  "Are both arms swollen or just one arm?"     Left Arm, Elbow to Hand, Right Hand  3. SEVERITY: "How bad is the swelling?" (e.g., localized; mild, moderate, severe)   - LOCALIZED: Small area of puffiness or swelling on just one arm   - JOINT SWELLING: Swelling of one joint   - MILD: Puffiness or swelling of hand   - MODERATE: Puffiness or swollen feeling of entire arm    - SEVERE: All of arm looks swollen; pitting edema     Moderate to Severe  4. REDNESS: "Does the swelling look red or infected?"     Redness  5. PAIN: "Is the swelling painful to touch?" If Yes, ask: "How painful is it?"   (Scale 1-10; mild, moderate or severe)     No  6. FEVER: "Do you have a fever?" If Yes, ask: "What is it, how was it measured, and when did it start?"      No  7. CAUSE:  "What do you think is causing the arm swelling?"     Unsure  8. MEDICAL HISTORY: "Do you have a history of heart failure, kidney disease, liver failure, or cancer?"     Sepsis  9. RECURRENT SYMPTOM: "Have you had arm swelling before?" If Yes, ask: "When was the last time?" "What happened that time?"     No  10. OTHER SYMPTOMS: "Do you have any other symptoms?" (e.g., chest pain, difficulty breathing)       Itching, Drainage  11. PREGNANCY: "Is there any chance you are pregnant?" "When was your last menstrual period?"       No and No  Protocols used: Arm Swelling and Edema-A-AH

## 2023-08-04 NOTE — Telephone Encounter (Signed)
 Current warfarin dosing is 1 tablet (2.5 mg) daily.  Lovenox bridge;  5/1: Take last dose of warfarin 5/2: NO warfarin, NO Lovenox 5/3: NO warfarin, inject Lovenox once in the AM 5/4: NO warfarin, inject Lovenox once in the AM 5/5: NO warfarin, inject Lovenox once in the AM (BEFORE 7 AM)  5/6: SURGERY; NO WARFARIN, NO LOVENOX  5/7: Take 1 1/2 tablets (3.75 mg) warfarin, inject Lovenox once in the AM 5/8: Take 1 1/2 tablets (3.75 mg) warfarin, inject Lovenox once in the AM 5/9: Take 1 1/2 tablets (3.75 mg) warfarin, inject Lovenox once in th AM 5/10: Take 1 1/2 tablets (3.75 mg) warfarin, inject Lovenox once in the AM 5/11 Take 1 tablet (2.5 mg) warfarin, inject Lovenox once in the AM 5/12: Take 1 tablet (2.5 mg) warfarin, inject Lovenox once in the AM 5/13: Recheck INR; NO WARFARIN AND NO LOVENOX UNTIL AFTER INR CHECK

## 2023-08-04 NOTE — Telephone Encounter (Signed)
 Noted.

## 2023-08-04 NOTE — Telephone Encounter (Signed)
 Agree with plan for Lovenox bridge. Thanks! -CMP

## 2023-08-04 NOTE — Telephone Encounter (Signed)
 Pt reported to coumadin clinic yesterday that she is scheduled for knee surgery for 5/6. Advised this nurse would advise PCP of upcoming surgery and evaluate need for lovenox bridge.   Recommend lovenox bridge for this pt around her surgery.  Actual Wt: 92 kg Ideal Wt: 51 kg (180% > than actual; if ? 125% > then adjusted wt should be used for CrCl calculation) Adjusted Wt: 67 kg CrCl: 65.43 mL/min (using adjusted wt)   If CrCl is >30 mL/min pt can [place] 1.5 mg/kg Lovenox once daily.   Recommended Lovenox injections of 150 mg daily in the AM.

## 2023-08-05 ENCOUNTER — Encounter: Payer: Self-pay | Admitting: Family Medicine

## 2023-08-05 ENCOUNTER — Ambulatory Visit (INDEPENDENT_AMBULATORY_CARE_PROVIDER_SITE_OTHER): Admitting: Family Medicine

## 2023-08-05 VITALS — BP 123/73 | HR 85 | Temp 97.7°F | Ht 62.0 in | Wt 184.6 lb

## 2023-08-05 DIAGNOSIS — E1169 Type 2 diabetes mellitus with other specified complication: Secondary | ICD-10-CM | POA: Diagnosis not present

## 2023-08-05 DIAGNOSIS — D692 Other nonthrombocytopenic purpura: Secondary | ICD-10-CM

## 2023-08-05 DIAGNOSIS — K55069 Acute infarction of intestine, part and extent unspecified: Secondary | ICD-10-CM

## 2023-08-05 MED ORDER — HYDROCORTISONE 2.5 % EX CREA: TOPICAL_CREAM | Freq: Two times a day (BID) | CUTANEOUS | 1 refills | Status: DC

## 2023-08-05 MED ORDER — EZETIMIBE 10 MG PO TABS: 10.0000 mg | ORAL_TABLET | Freq: Every day | ORAL | 3 refills | Status: AC

## 2023-08-05 NOTE — Assessment & Plan Note (Signed)
 Reassured patient.  We have recommended against scratching and itching as above.  Will start topical hydrocortisone cream.  Also recommended Sarna lotion.

## 2023-08-05 NOTE — Assessment & Plan Note (Signed)
 She is now Coumadin.  Overall doing well with this though is having some issues with bleeding and bruising as above.  She will need Lovenox bridge prior to her knee replacement-this has already been planned via Coumadin clinic.

## 2023-08-05 NOTE — Progress Notes (Signed)
   [name] Good is a 73 y.o. female who presents today for an office visit.  Assessment/Plan:  New/Acute Problems: Rash Exam consistent with senile purpura due to Coumadin use.  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  Reassured patient.  No signs of infection.  Discussed importance of avoiding itching the area.  We did discuss importance of emollients as well.  She can try over-the-counter Sarna a few times daily to help with itching.  Will also start topical hydrocortisone cream for the next 1 to 2 weeks.  She will let us know if not improving.  Chronic Problems Addressed Today: Senile purpura (HCC) Reassured patient.  We have recommended against scratching and itching as above.  Will start topical hydrocortisone cream.  Also recommended Sarna lotion.  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  Overall doing well with this though is having some issues with bleeding and bruising as above.  She will need Lovenox bridge prior to her knee replacement-this has already been planned via Coumadin clinic.  T2DM (type 2 diabetes mellitus) (HCC) Too early to recheck today.  Last A1c was at goal without meds.  She will have this checked prior to her upcoming knee replacement surgery.  We can recheck again in 3 to 6 months.    Subjective:  HPI:  See A/P for status of chronic conditions.  Patient is here today with rash.  This started a couple of weeks ago. Located on both arms.  She has had some itching in the area.  She has noticed different colorations including red, gray, and blue.  No specific treatments tried.  No injuries.  No precipitating events.  Since her last visit she was started on Coumadin.  She did have an issue with supratherapeutic INR about a month ago.  She was admitted with INR of 9.1.  This was reversed with vitamin K.  Her Coumadin dose was decreased at that time  and she was discharged home.  Since then she has done well.  She is currently following with Coumadin clinic.    She does have upcoming knee replacement surgery and will need to be on a Lovenox bridge for this.  She is currently tolerating her Coumadin well.       Objective:  Physical Exam: BP 123/73   Pulse 85   Temp 97.7 F (36.5 C) (Temporal)   Ht 5\' 2"  (1.575 m)   Wt 184 lb 9.6 oz (83.7 kg)   SpO2 98%   BMI 33.76 kg/m   Gen: No acute distress, resting comfortably Skin: Bilateral arms with scattered excoriated nodules and upper right ecchymosis. Neuro: Grossly normal, moves all extremities Psych: Normal affect and thought content  Time Spent: 40 minutes of total time was spent on the date of the encounter performing the following actions: chart review prior to seeing the patient including recent hospitalization, obtaining history, performing a medically necessary exam, counseling on the treatment plan, placing orders, and documenting in our EHR.        [name]. [name], MD 08/05/2023 9:52 AM

## 2023-08-05 NOTE — Telephone Encounter (Signed)
 Agree with bridge plan.  Thank you! [name]. [name], MD 08/05/2023 8:00 AM

## 2023-08-05 NOTE — Assessment & Plan Note (Signed)
 Too early to recheck today.  Last A1c was at goal without meds.  She will have this checked prior to her upcoming knee replacement surgery.  We can recheck again in 3 to 6 months.

## 2023-08-05 NOTE — Patient Instructions (Addendum)
 It was very nice to see you today!  You iron deposts in your skin form slight bleeding. You also have some itchy nodules from itching.  Please use the Sarna lotion.  Use the hydrocortisone cream twice daily.  Let me know if not improving in the next few weeks.  Return if symptoms worsen or fail to improve.   Take care, Dr [name]  PLEASE NOTE:  If you had any lab tests, please let us know if you have not heard back within a few days. You may see your results on mychart before we have a chance to review them but we will give you a call once they are reviewed by [place].   If we ordered any referrals today, please let us know if you have not heard from their office within the next week.   If you had any urgent prescriptions sent in today, please check with the pharmacy within an hour of our visit to make sure the prescription was transmitted appropriately.   Please try these tips to maintain a healthy lifestyle:  Eat at least 3 REAL meals and 1-2 snacks per day.  Aim for no more than 5 hours between eating.  If you eat breakfast, please do so within one hour of getting up.   Each meal should contain half fruits/vegetables, one quarter protein, and one quarter carbs (no bigger than a computer mouse)  Cut down on sweet beverages. This includes juice, soda, and sweet tea.   Drink at least 1 glass of water with each meal and aim for at least 8 glasses per day  Exercise at least 150 minutes every week.

## 2023-08-05 NOTE — Telephone Encounter (Signed)
 Noted, will educate pt and family at next coumadin clinic apt on 4/29.

## 2023-08-09 ENCOUNTER — Telehealth: Payer: Self-pay | Admitting: *Deleted

## 2023-08-09 NOTE — Telephone Encounter (Signed)
 Copied from CRM [id number]. Topic: General - Other >> Aug 09, 2023 12:33 PM Howard Macho wrote: Reason for CRM: patient called stating she saw her provider on 4/10 for a rash and he stated to call back if the rash does not get better and the patient stated her rash is getting worse and she would like something called in that is different that what she has CB [phone]   Please advise  Cashawn Yanko,RMA

## 2023-08-10 NOTE — Telephone Encounter (Signed)
 Ok to send in triamcinolone 0.5% ointment apply twice daily. Recommend she follow up her or we can refer to dermatology if not improving.  [name] Good. Daneil Dunker, MD 08/10/2023 6:49 PM

## 2023-08-11 ENCOUNTER — Other Ambulatory Visit: Payer: Self-pay | Admitting: *Deleted

## 2023-08-11 MED ORDER — TRIAMCINOLONE ACETONIDE 0.5 % EX OINT: 1.0000 | TOPICAL_OINTMENT | Freq: Two times a day (BID) | CUTANEOUS | 0 refills | Status: DC

## 2023-08-11 NOTE — Telephone Encounter (Signed)
 Rx send to pharmacy  Unable to contact patient voice mail is full

## 2023-08-16 ENCOUNTER — Other Ambulatory Visit: Payer: Self-pay | Admitting: Family Medicine

## 2023-08-19 ENCOUNTER — Ambulatory Visit: Payer: Medicare Other | Admitting: Family Medicine

## 2023-08-23 NOTE — Pre-Procedure Instructions (Signed)
 Surgical Instructions   Your procedure is scheduled on Tuesday, May 6th. Report to [facility] Main Entrance "A" at 07:45 A.M., then check in with the Admitting office. Any questions or running late day of surgery: call [phone]  Questions prior to your surgery date: call [phone], Monday-Friday, 8am-4pm. If you experience any cold or flu symptoms such as cough, fever, chills, shortness of breath, etc. between now and your scheduled surgery, please notify us  at the above number.     Remember:  Do not eat after midnight the night before your surgery  You may drink clear liquids until 06:45 AM the morning of your surgery.   Clear liquids allowed are: Water, Non-Citrus Juices (without pulp), Carbonated Beverages, Clear Tea (no milk, honey, etc.), Black Coffee Only (NO MILK, CREAM OR POWDERED CREAMER of any kind), and Gatorade.  Patient Instructions  The night before surgery:  No food after midnight. ONLY clear liquids after midnight  The day of surgery (if you do NOT have diabetes):  Drink ONE (1) Pre-Surgery Clear Ensure by 06:45 AM the morning of surgery. Drink in one sitting. Do not sip.  This drink was given to you during your hospital  pre-op appointment visit.  Nothing else to drink after completing the  Pre-Surgery Clear Ensure.         If you have questions, please contact your surgeon's office.    Take these medicines the morning of surgery with A SIP OF WATER  atorvastatin  (LIPITOR )  escitalopram  (LEXAPRO )  ezetimibe  (ZETIA )  oxyCODONE -acetaminophen  (PERCOCET)  predniSONE  (DELTASONE )   May take these medicines IF NEEDED: baclofen  (LIORESAL )  famotidine  (PEPCID )  ondansetron  (ZOFRAN )    Current warfarin dosing is 1 tablet (2.5 mg) daily.   Lovenox  bridge;   5/1: Take last dose of warfarin 5/2: NO warfarin, NO Lovenox  5/3: NO warfarin, inject Lovenox  once in the AM 5/4: NO warfarin, inject Lovenox  once in the AM 5/5: NO warfarin, inject Lovenox  once in  the AM (BEFORE 7 AM)   5/6: SURGERY; NO WARFARIN, NO LOVENOX    5/7: Take 1 1/2 tablets (3.75 mg) warfarin, inject Lovenox  once in the AM 5/8: Take 1 1/2 tablets (3.75 mg) warfarin, inject Lovenox  once in the AM 5/9: Take 1 1/2 tablets (3.75 mg) warfarin, inject Lovenox  once in th AM 5/10: Take 1 1/2 tablets (3.75 mg) warfarin, inject Lovenox  once in the AM 5/11 Take 1 tablet (2.5 mg) warfarin, inject Lovenox  once in the AM 5/12: Take 1 tablet (2.5 mg) warfarin, inject Lovenox  once in the AM 5/13: Recheck INR; NO WARFARIN AND NO LOVENOX  UNTIL AFTER INR CHECK    Follow your surgeon's instructions on when to stop clopidogrel  (PLAVIX ).  If no instructions were given by your surgeon then you will need to call the office to get those instructions.     STOP taking phentermine  (ADIPEX-P ) 2 weeks prior to surgery or ASAP.   One week prior to surgery, STOP taking any Aspirin  (unless otherwise instructed by your surgeon) Aleve, Naproxen, Ibuprofen, Motrin, Advil, Goody's, BC's, all herbal medications, fish oil, and non-prescription vitamins.                     Do NOT Smoke (Tobacco/Vaping) for 24 hours prior to your procedure.  If you use a CPAP at night, you may bring your mask/headgear for your overnight stay.   You will be asked to remove any contacts, glasses, piercing's, hearing aid's, dentures/partials prior to surgery. Please bring cases for these items if  needed.    Patients discharged the day of surgery will not be allowed to drive home, and someone needs to stay with them for 24 hours.  SURGICAL WAITING ROOM VISITATION Patients may have no more than 2 support people in the waiting area - these visitors may rotate.   Pre-op nurse will coordinate an appropriate time for 1 ADULT support person, who may not rotate, to accompany patient in pre-op.  Children under the age of 9 must have an adult with them who is not the patient and must remain in the main waiting area with an adult.  If  the patient needs to stay at the hospital during part of their recovery, the visitor guidelines for inpatient rooms apply.  Please refer to the [facility] website for the visitor guidelines for any additional information.   If you received a COVID test during your pre-op visit  it is requested that you wear a mask when out in public, stay away from anyone that may not be feeling well and notify your surgeon if you develop symptoms. If you have been in contact with anyone that has tested positive in the last 10 days please notify you surgeon.      Pre-operative 5 CHG Bathing Instructions   You can play a key role in reducing the risk of infection after surgery. Your skin needs to be as free of germs as possible. You can reduce the number of germs on your skin by washing with CHG (chlorhexidine  gluconate) soap before surgery. CHG is an antiseptic soap that kills germs and continues to kill germs even after washing.   DO NOT use if you have an allergy to chlorhexidine /CHG or antibacterial soaps. If your skin becomes reddened or irritated, stop using the CHG and notify one of our RNs at [phone].   Please shower with the CHG soap starting 4 days before surgery using the following schedule:     Please keep in mind the following:  DO NOT shave, including legs and underarms, starting the day of your first shower.   You may shave your face at any point before/day of surgery.  Place clean sheets on your bed the day you start using CHG soap. Use a clean washcloth (not used since being washed) for each shower. DO NOT sleep with pets once you start using the CHG.   CHG Shower Instructions:  Wash your face and private area with normal soap. If you choose to wash your hair, wash first with your normal shampoo.  After you use shampoo/soap, rinse your hair and body thoroughly to remove shampoo/soap residue.  Turn the water OFF and apply about 3 tablespoons (45 ml) of CHG soap to a CLEAN washcloth.   Apply CHG soap ONLY FROM YOUR NECK DOWN TO YOUR TOES (washing for 3-5 minutes)  DO NOT use CHG soap on face, private areas, open wounds, or sores.  Pay special attention to the area where your surgery is being performed.  If you are having back surgery, having someone wash your back for you may be helpful. Wait 2 minutes after CHG soap is applied, then you may rinse off the CHG soap.  Pat dry with a clean towel  Put on clean clothes/pajamas   If you choose to wear lotion, please use ONLY the CHG-compatible lotions that are listed below.  Additional instructions for the day of surgery: DO NOT APPLY any lotions, deodorants, cologne, or perfumes.   Do not bring valuables to the hospital. [facility]  is not responsible for any belongings/valuables. Do not wear nail polish, gel polish, artificial nails, or any other type of covering on natural nails (fingers and toes) Do not wear jewelry or makeup Put on clean/comfortable clothes.  Please brush your teeth.  Ask your nurse before applying any prescription medications to the skin.     CHG Compatible Lotions   Aveeno Moisturizing lotion  Cetaphil Moisturizing Cream  Cetaphil Moisturizing Lotion  Clairol Herbal Essence Moisturizing Lotion, Dry Skin  Clairol Herbal Essence Moisturizing Lotion, Extra Dry Skin  Clairol Herbal Essence Moisturizing Lotion, Normal Skin  Curel Age Defying Therapeutic Moisturizing Lotion with Alpha Hydroxy  Curel Extreme Care Body Lotion  Curel Soothing Hands Moisturizing Hand Lotion  Curel Therapeutic Moisturizing Cream, Fragrance-Free  Curel Therapeutic Moisturizing Lotion, Fragrance-Free  Curel Therapeutic Moisturizing Lotion, Original Formula  Eucerin Daily Replenishing Lotion  Eucerin Dry Skin Therapy Plus Alpha Hydroxy Crme  Eucerin Dry Skin Therapy Plus Alpha Hydroxy Lotion  Eucerin Original Crme  Eucerin Original Lotion  Eucerin Plus Crme Eucerin Plus Lotion  Eucerin TriLipid Replenishing  Lotion  Keri Anti-Bacterial Hand Lotion  Keri Deep Conditioning Original Lotion Dry Skin Formula Softly Scented  Keri Deep Conditioning Original Lotion, Fragrance Free Sensitive Skin Formula  Keri Lotion Fast Absorbing Fragrance Free Sensitive Skin Formula  Keri Lotion Fast Absorbing Softly Scented Dry Skin Formula  Keri Original Lotion  Keri Skin Renewal Lotion Keri Silky Smooth Lotion  Keri Silky Smooth Sensitive Skin Lotion  Nivea Body Creamy Conditioning Oil  Nivea Body Extra Enriched Lotion  Nivea Body Original Lotion  Nivea Body Sheer Moisturizing Lotion Nivea Crme  Nivea Skin Firming Lotion  NutraDerm 30 Skin Lotion  NutraDerm Skin Lotion  NutraDerm Therapeutic Skin Cream  NutraDerm Therapeutic Skin Lotion  ProShield Protective Hand Cream  Provon moisturizing lotion  Please read over the following fact sheets that you were given.

## 2023-08-24 ENCOUNTER — Encounter (HOSPITAL_COMMUNITY): Payer: Self-pay

## 2023-08-24 ENCOUNTER — Other Ambulatory Visit: Payer: Self-pay

## 2023-08-24 ENCOUNTER — Ambulatory Visit (INDEPENDENT_AMBULATORY_CARE_PROVIDER_SITE_OTHER)

## 2023-08-24 ENCOUNTER — Telehealth: Payer: Self-pay | Admitting: Orthopaedic Surgery

## 2023-08-24 ENCOUNTER — Encounter (HOSPITAL_COMMUNITY)
Admission: RE | Admit: 2023-08-24 | Discharge: 2023-08-24 | Disposition: A | Discharge status: Home / Self Care | Source: Ambulatory Visit | Attending: Orthopaedic Surgery | Admitting: Orthopaedic Surgery

## 2023-08-24 VITALS — BP 147/68 | HR 93 | Temp 97.9°F | Resp 17 | Ht 63.0 in | Wt 185.6 lb

## 2023-08-24 DIAGNOSIS — Z87891 Personal history of nicotine dependence: Secondary | ICD-10-CM | POA: Diagnosis not present

## 2023-08-24 DIAGNOSIS — Z01812 Encounter for preprocedural laboratory examination: Secondary | ICD-10-CM | POA: Diagnosis not present

## 2023-08-24 DIAGNOSIS — M1380 Other specified arthritis, unspecified site: Secondary | ICD-10-CM | POA: Diagnosis not present

## 2023-08-24 DIAGNOSIS — Z86718 Personal history of other venous thrombosis and embolism: Secondary | ICD-10-CM | POA: Insufficient documentation

## 2023-08-24 DIAGNOSIS — Z7901 Long term (current) use of anticoagulants: Secondary | ICD-10-CM | POA: Diagnosis not present

## 2023-08-24 DIAGNOSIS — M1712 Unilateral primary osteoarthritis, left knee: Secondary | ICD-10-CM | POA: Diagnosis not present

## 2023-08-24 DIAGNOSIS — Z7952 Long term (current) use of systemic steroids: Secondary | ICD-10-CM | POA: Insufficient documentation

## 2023-08-24 DIAGNOSIS — Z01818 Encounter for other preprocedural examination: Secondary | ICD-10-CM

## 2023-08-24 DIAGNOSIS — Z8673 Personal history of transient ischemic attack (TIA), and cerebral infarction without residual deficits: Secondary | ICD-10-CM | POA: Insufficient documentation

## 2023-08-24 DIAGNOSIS — Z7902 Long term (current) use of antithrombotics/antiplatelets: Secondary | ICD-10-CM | POA: Diagnosis not present

## 2023-08-24 LAB — CBC
HCT: 34.5 % — ABNORMAL LOW (ref 36.0–46.0)
Hemoglobin: 10.9 g/dL — ABNORMAL LOW (ref 12.0–15.0)
MCH: 30.5 pg (ref 26.0–34.0)
MCHC: 31.6 g/dL (ref 30.0–36.0)
MCV: 96.6 fL (ref 80.0–100.0)
Platelets: 241 10*3/uL (ref 150–400)
RBC: 3.57 MIL/uL — ABNORMAL LOW (ref 3.87–5.11)
RDW: 13.5 % (ref 11.5–15.5)
WBC: 7.1 10*3/uL (ref 4.0–10.5)
nRBC: 0 % (ref 0.0–0.2)

## 2023-08-24 LAB — BASIC METABOLIC PANEL WITH GFR
Anion gap: 7 (ref 5–15)
BUN: 16 mg/dL (ref 8–23)
CO2: 24 mmol/L (ref 22–32)
Calcium: 9 mg/dL (ref 8.9–10.3)
Chloride: 106 mmol/L (ref 98–111)
Creatinine, Ser: 0.75 mg/dL (ref 0.44–1.00)
GFR, Estimated: >60 mL/min (ref >60)
Glucose, Bld: 102 mg/dL — ABNORMAL HIGH (ref 70–99)
Potassium: 4.1 mmol/L (ref 3.5–5.1)
Sodium: 137 mmol/L (ref 135–145)

## 2023-08-24 LAB — SURGICAL PCR SCREEN
MRSA, PCR: POSITIVE — AB
Staphylococcus aureus: POSITIVE — AB

## 2023-08-24 LAB — POCT INR: INR: 1.6 — AB (ref 2.0–3.0)

## 2023-08-24 MED ORDER — ENOXAPARIN SODIUM 120 MG/0.8ML IJ SOSY: 120.0000 mg | PREFILLED_SYRINGE | INTRAMUSCULAR | 0 refills | Status: DC

## 2023-08-24 NOTE — Progress Notes (Signed)
 Positive PCR results called in to [facility] at Dr. Arvella Bird office

## 2023-08-24 NOTE — Telephone Encounter (Signed)
 Patient called and wants to know when do she stop plavix  before surgery? CB#[phone]

## 2023-08-24 NOTE — Progress Notes (Signed)
 PCP - Dr. Valdene Garret Cardiologist - denies. Pt did see Dr. Jacquelynn Matter while in the hospital 11/26/21, but says she has not seen cardiology since  PPM/ICD - denies   Chest x-ray - 03/08/20 EKG - 12/07/22 Stress Test - denies ECHO - 12/09/21 Cardiac Cath - 1991  Sleep Study - denies  DM- denies  Last dose of GLP1 agonist-  n/a   Blood Thinner Instructions: f/u with surgeon for Plavix  instructions -Coumadin  instructions in chart  Aspirin  Instructions:n/a  ERAS Protcol - clears until 0645 PRE-SURGERY Ensure given  COVID TEST- n/a   Anesthesia review: yes, cardiac hx. Coumadin  instructions in chart. Pt supposed to f/u with surgeon about Plavix . Pt has appt with coumadin  clinic today at 1400.  Patient denies shortness of breath, fever, cough and chest pain at PAT appointment   All instructions explained to the patient, with a verbal understanding of the material. Patient agrees to go over the instructions while at home for a better understanding. The opportunity to ask questions was provided.

## 2023-08-24 NOTE — Telephone Encounter (Signed)
Tried calling pt but no answer and mailbox was full 

## 2023-08-24 NOTE — Telephone Encounter (Signed)
 Review of chart revealed pt's weight has decreased to 83.7 kg, last measured on 4/10 at PCP apt. Prior Lovenox  dosing was completed with wt of 92 kg.  Will adjust Lovenox  dose to 120 mg daily due to decrease in wt, currently 83 kg.  Pt will be in later today for INR check and education on Lovenox  bridge.

## 2023-08-24 NOTE — Telephone Encounter (Signed)
 Noted. Agree with plan.  Thank you! Jinny Mounts. Daneil Dunker, MD 08/24/2023 12:59 PM

## 2023-08-24 NOTE — Progress Notes (Signed)
 Pt having knee surgery scheduled for 5/6 and will be placed on a lovenox  bridge.  Pt inquired when she should stop Plavix . Advised her surgery paperwork reports the surgeon should advised on when to stop it. Pt will contact the surgeon's office today for guidance on stopping Plavix  for the surgery.  Increase dose today to take 1 1/2 tablets and then continue 1 tablets (2.5 mg) daily until starting instructions below. If hospitalized after surgery follow hospital instructions for warfarin and lovenox . When discharged from hospital start instructions below on the date of discharge. If any questions contact the coumadin  clinic at [phone].  5/1: Take last dose of warfarin 5/2: NO warfarin, NO Lovenox  5/3: NO warfarin, inject Lovenox  once in the AM 5/4: NO warfarin, inject Lovenox  once in the AM 5/5: NO warfarin, inject Lovenox  once in the AM (BEFORE 7 AM)   5/6: SURGERY; NO WARFARIN, NO LOVENOX    5/7: Take 1 1/2 tablets (3.75 mg) warfarin, inject Lovenox  once in the AM 5/8: Take 1 1/2 tablets (3.75 mg) warfarin, inject Lovenox  once in the AM 5/9: Take 1 1/2 tablets (3.75 mg) warfarin, inject Lovenox  once in th AM 5/10: Take 1 1/2 tablets (3.75 mg) warfarin, inject Lovenox  once in the AM 5/11 Take 1 tablet (2.5 mg) warfarin, inject Lovenox  once in the AM 5/12: Recheck INR; NO WARFARIN AND NO LOVENOX  UNTIL AFTER INR CHECK  Sent in script for Lovenox .

## 2023-08-24 NOTE — Patient Instructions (Addendum)
 Pre visit review using our clinic review tool, if applicable. No additional management support is needed unless otherwise documented below in the visit note.  Increase dose today to take 1 1/2 tablets and then continue 1 tablets (2.5 mg) daily until starting instructions below. If hospitalized after surgery follow hospital instructions for warfarin and lovenox . When discharged from hospital start instructions below on the date of discharge. If any questions contact the coumadin  clinic at [phone].  5/1: Take last dose of warfarin 5/2: NO warfarin, NO Lovenox  5/3: NO warfarin, inject Lovenox  once in the AM 5/4: NO warfarin, inject Lovenox  once in the AM 5/5: NO warfarin, inject Lovenox  once in the AM (BEFORE 7 AM)   5/6: SURGERY; NO WARFARIN, NO LOVENOX    5/7: Take 1 1/2 tablets (3.75 mg) warfarin, inject Lovenox  once in the AM 5/8: Take 1 1/2 tablets (3.75 mg) warfarin, inject Lovenox  once in the AM 5/9: Take 1 1/2 tablets (3.75 mg) warfarin, inject Lovenox  once in th AM 5/10: Take 1 1/2 tablets (3.75 mg) warfarin, inject Lovenox  once in the AM 5/11 Take 1 tablet (2.5 mg) warfarin, inject Lovenox  once in the AM 5/12: Recheck INR; NO WARFARIN AND NO LOVENOX  UNTIL AFTER INR CHECK

## 2023-08-25 ENCOUNTER — Other Ambulatory Visit (HOSPITAL_COMMUNITY)

## 2023-08-25 NOTE — Anesthesia Preprocedure Evaluation (Addendum)
 Anesthesia Evaluation  Patient identified by MRN, date of birth, ID band Patient awake    Reviewed: Allergy & Precautions, NPO status , Patient's Chart, lab work & pertinent test results  Airway Mallampati: III  TM Distance: <3 FB Neck ROM: Limited    Dental  (+) Edentulous Upper, Edentulous Lower, Dental Advisory Given   Pulmonary former smoker   breath sounds clear to auscultation       Cardiovascular + Past MI and + Peripheral Vascular Disease   Rhythm:Regular Rate:Normal  Echo 11/2021  1. ? small mid cavitary gradient No SAM appreciated and no turbulence in LVOT on color flow . Left ventricular ejection fraction, by estimation, is 60 to 65%. The left ventricle has normal function. The left ventricle has no regional wall motion abnormalities. Left ventricular diastolic parameters are consistent with Grade I diastolic dysfunction (impaired relaxation).   2. Right ventricular systolic function is normal. The right ventricular size is normal.   3. Left atrial size was mildly dilated.   4. The mitral valve is abnormal. No evidence of mitral valve regurgitation. No evidence of mitral stenosis.   5. The aortic valve was not well visualized. Aortic valve regurgitation is not visualized. No aortic stenosis is present.   6. The inferior vena cava is normal in size with greater than 50% respiratory variability, suggesting right atrial pressure of 3 mmHg.      Neuro/Psych  PSYCHIATRIC DISORDERS Anxiety Depression    CVA    GI/Hepatic ,GERD  Medicated and Controlled,,  Endo/Other  diabetes  Class 3 obesity  Renal/GU      Musculoskeletal  (+) Arthritis ,    Abdominal  (+) + obese  Peds  Hematology   Anesthesia Other Findings   Reproductive/Obstetrics                              Anesthesia Physical Anesthesia Plan  ASA: 3  Anesthesia Plan: General   Post-op Pain Management: Tylenol  PO (pre-op)*  and Regional block*   Induction: Intravenous  PONV Risk Score and Plan: 4 or greater and Ondansetron , Dexamethasone  and Treatment may vary due to age or medical condition  Airway Management Planned: Oral ETT  Additional Equipment:   Intra-op Plan:   Post-operative Plan: Extubation in OR  Informed Consent: I have reviewed the patients History and Physical, chart, labs and discussed the procedure including the risks, benefits and alternatives for the proposed anesthesia with the patient or authorized representative who has indicated his/her understanding and acceptance.     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  Most recent echo 12/09/2021 showed EF 60 to 65%, grade 1 DD, normal RV function, no significant valvular abnormalities.  Coumadin  is managed by PCP Dr. Valdene Garret.  He has recommended Lovenox  bridge which is being coordinated by Coumadin  clinic.  Dr. Lucienne Ryder instructed patient to hold Plavix  1 week prior to surgery.  Preop labs reviewed, mild anemia with hemoglobin 10.9, otherwise unremarkable.  EKG 12/08/2022: Sinus rhythm.  Rate 86.  Low voltage, precordial leads.  Abnormal R wave progression, early transition.  TTE 12/09/2021:  1. ?  small mid cavitary gradient No SAM appreciated and no turbulence in  LVOT on color flow . Left ventricular ejection fraction, by estimation, is  60 to 65%. The left ventricle has normal function. The left ventricle has  no regional wall motion  abnormalities. Left ventricular diastolic parameters are consistent with  Grade I  diastolic dysfunction (impaired relaxation).   2. Right ventricular systolic function is normal. The right ventricular  size is normal.   3. Left atrial size was mildly dilated.   4. The mitral valve is abnormal. No evidence of mitral valve  regurgitation. No evidence of mitral stenosis.   5. The aortic valve was not well visualized. Aortic valve regurgitation  is not visualized. No aortic stenosis is present.   6. The inferior vena cava is normal in size with greater than 50%  respiratory variability, suggesting right atrial pressure of 3 mmHg.   )         Anesthesia Quick Evaluation

## 2023-08-25 NOTE — Progress Notes (Signed)
 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  Most recent echo 12/09/2021 showed EF 60 to 65%, grade 1 DD, normal RV function, no significant valvular abnormalities.  Coumadin  is managed by PCP Dr. Valdene Garret.  He has recommended Lovenox  bridge which is being coordinated by Coumadin  clinic.  Dr. Lucienne Ryder instructed patient to hold Plavix  1 week prior to surgery.  Preop labs reviewed, mild anemia with hemoglobin 10.9, otherwise unremarkable.  EKG 12/08/2022: Sinus rhythm.  Rate 86.  Low voltage, precordial leads.  Abnormal R wave progression, early transition.  TTE 12/09/2021:  1. ? small mid cavitary gradient No SAM appreciated and no turbulence in  LVOT on color flow . Left ventricular ejection fraction, by estimation, is  60 to 65%. The left ventricle has normal function. The left ventricle has  no regional wall motion  abnormalities. Left ventricular diastolic parameters are consistent with  Grade I diastolic dysfunction (impaired relaxation).   2. Right ventricular systolic function is normal. The right ventricular  size is normal.   3. Left atrial size was mildly dilated.   4. The mitral valve is abnormal. No evidence of mitral valve  regurgitation. No evidence of mitral stenosis.   5. The aortic valve was not well visualized. Aortic valve regurgitation  is not visualized. No aortic stenosis is present.   6. The inferior vena cava is normal in size with greater than 50%  respiratory variability, suggesting right atrial pressure of 3 mmHg.     Edilia Gordon [facility] Short Stay Center/Anesthesiology Phone [phone] 08/25/2023 1:55 PM

## 2023-08-25 NOTE — Telephone Encounter (Signed)
Patient aware of below message

## 2023-08-30 ENCOUNTER — Telehealth: Payer: Self-pay | Admitting: *Deleted

## 2023-08-30 NOTE — Telephone Encounter (Signed)
 [facility] pre op call to patient.

## 2023-08-30 NOTE — H&P (Signed)
 TOTAL KNEE ADMISSION H&P  Patient is being admitted for left total knee arthroplasty.  Subjective:  Chief Complaint:left knee pain.  HPI: [name] Good, 73 y.o. female, has a history of pain and functional disability in the left knee due to arthritis and has failed non-surgical conservative treatments for greater than 12 weeks to includeNSAID's and/or analgesics, corticosteriod injections, viscosupplementation injections, flexibility and strengthening excercises, use of assistive devices, weight reduction as appropriate, and activity modification.  Onset of symptoms was gradual, starting several years ago with gradually worsening course since that time. The patient noted no past surgery on the left knee(s).  Patient currently rates pain in the left knee(s) at 10 out of 10 with activity. Patient has night pain, worsening of pain with activity and weight bearing, pain that interferes with activities of daily living, pain with passive range of motion, crepitus, and joint swelling.  Patient has evidence of subchondral sclerosis, periarticular osteophytes, and joint space narrowing by imaging studies. There is no active infection.  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   History of CVA (cerebrovascular accident) 12/07/2022   T2DM (type 2 diabetes mellitus) (HCC) 08/11/2022   Chronic pain syndrome 10/30/2021   Morbid obesity (HCC) 09/02/2021   Depression, major, single episode, moderate (HCC) 07/15/2021   Vertebral artery stenosis 07/15/2021   Cerebrovascular disease 07/15/2021   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   RLS (restless legs syndrome)  10/02/2019   Arthritis 10/18/2017   Former smoker 11/13/2016   Autoimmune disease (HCC) 08/12/2016   Obesity (BMI 35.0-39.9 without comorbidity) 08/12/2016   Status post total replacement of right hip 10/25/2015   S/P lumbar laminectomy 06/14/2015   Past Medical History:  Diagnosis Date   Anxiety    Arthritis    Depression    GERD (gastroesophageal reflux disease)    Myocardial infarction (HCC) 89   no visits to cardiac dr(thomas kelly) since 89   Stroke (HCC)    Wound dehiscence    lumbar    Past Surgical History:  Procedure Laterality Date   APPENDECTOMY     17 yrs   BACK SURGERY     BREAST SURGERY Left    cyst   BUBBLE STUDY  03/12/2020   Procedure: BUBBLE STUDY;  Surgeon: Pasqual Bone, MD;  Location: [facility] ENDOSCOPY;  Service: Cardiovascular;;   CARDIAC CATHETERIZATION  51   CHOLECYSTECTOMY     73 yrs old   LUMBAR LAMINECTOMY/DECOMPRESSION MICRODISCECTOMY N/A 06/14/2015   Procedure: Lumbar Three-Four,Lumbar Four-Five, Lumbar Five-Sacral One Laminectomy;  Surgeon: Isadora Mar, MD;  Location: [facility] NEURO ORS;  Service: Neurosurgery;  Laterality: N/A;   LUMBAR WOUND DEBRIDEMENT N/A 07/24/2015   Procedure: lumbar wound revision;  Surgeon: Isadora Mar, MD;  Location: [facility] NEURO ORS;  Service: Neurosurgery;  Laterality: N/A;   TEE WITHOUT CARDIOVERSION N/A 03/12/2020   Procedure: TRANSESOPHAGEAL ECHOCARDIOGRAM (TEE);  Surgeon: Pasqual Bone, MD;  Location: [facility] ENDOSCOPY;  Service: Cardiovascular;  Laterality: N/A;   TOTAL HIP ARTHROPLASTY Right 10/25/2015   Procedure: RIGHT TOTAL HIP ARTHROPLASTY ANTERIOR APPROACH;  Surgeon: Arnie Lao, MD;  Location: [facility] ORS;  Service: Orthopedics;  Laterality: Right;   TUBAL LIGATION      No current facility-administered  medications for this encounter.   Current Outpatient Medications  Medication Sig Dispense Refill Last Dose/Taking   atorvastatin  (LIPITOR ) 80 MG tablet Take 1 tablet (80 mg total) by mouth daily. 90 tablet 3 Taking    baclofen  (LIORESAL ) 10 MG tablet TAKE 1 TABLET BY MOUTH 3 TIMES DAILY (Patient taking differently: Take 10 mg by mouth daily as needed for muscle spasms.) 30 tablet 5 Taking Differently   calcium  carbonate (TUMS EX) 750 MG chewable tablet Chew 1 tablet by mouth daily as needed for heartburn.   Taking As Needed   clopidogrel  (PLAVIX ) 75 MG tablet Take 1 tablet (75 mg total) by mouth daily. 90 tablet 1 Taking   diphenhydrAMINE  (BENADRYL ) 25 mg capsule Take 25 mg by mouth at bedtime.   Taking   docusate sodium  (COLACE) 100 MG capsule Take 100 mg by mouth daily as needed for mild constipation or moderate constipation.   Taking As Needed   escitalopram  (LEXAPRO ) 20 MG tablet TAKE 1 TABLET BY MOUTH DAILY 90 tablet 3 Taking   ezetimibe  (ZETIA ) 10 MG tablet Take 1 tablet (10 mg total) by mouth daily. 90 tablet 3 Taking   famotidine  (PEPCID ) 20 MG tablet Take 20 mg by mouth daily as needed for heartburn or indigestion.   Taking As Needed   hydrocortisone  2.5 % cream Apply topically 2 (two) times daily. (Patient taking differently: Apply 1 Application topically 3 (three) times daily.) 453.6 g 1 Taking Differently   ondansetron  (ZOFRAN ) 4 MG tablet Take 1 tablet (4 mg total) by mouth every 8 (eight) hours as needed for nausea or vomiting. (Patient taking differently: Take 4 mg by mouth daily as needed for nausea or vomiting.) 20 tablet 0 Taking Differently   oxyCODONE -acetaminophen  (PERCOCET) 10-325 MG tablet TAKE ONE TABLET BY MOUTH FIVE TIMES DAILY 150 tablet 0 Taking   phentermine  (ADIPEX-P ) 37.5 MG tablet TAKE 1 TABLET BY MOUTH EVERY MORNING 30 tablet 2 Taking   predniSONE  (DELTASONE ) 5 MG tablet TAKE 1 TABLET BY MOUTH DAILY 90 tablet 3 Taking   rOPINIRole  (REQUIP ) 3 MG tablet TAKE 1 TABLET BY MOUTH AT BEDTIME AS NEEDED (Patient taking differently: Take 3 mg by mouth at bedtime.) 90 tablet 3 Taking Differently   Turmeric (QC TUMERIC COMPLEX PO) Take 1 capsule by mouth daily.   Taking   warfarin (COUMADIN )  2.5 MG tablet TAKE 1 TABLET BY MOUTH DAILY OR AS DIRECTED BY ANTICOAGULATION CLINIC 100 tablet 1 Taking   enoxaparin  (LOVENOX ) 120 MG/0.8ML injection Inject 0.8 mLs (120 mg total) into the skin daily. USE AS DIRECTED BY ANTICOAGULATION CLINIC 6.4 mL 0    methotrexate (RHEUMATREX) 2.5 MG tablet Take 2.5 mg by mouth once a week. Caution:Chemotherapy. Protect from light. (Patient not taking: Reported on 08/24/2023)   Not Taking   triamcinolone  ointment (KENALOG ) 0.5 % Apply 1 Application topically 2 (two) times daily. (Patient not taking: Reported on 08/24/2023) 30 g 0 Not Taking   Allergies  Allergen Reactions   Amoxicillin Other (See Comments)    Tolerated Zosyn  Has patient had a PCN reaction causing immediate rash, facial/tongue/throat swelling, SOB or lightheadedness with hypotension: No Has patient had a PCN reaction causing severe rash involving mucus membranes or skin necrosis: No Has patient had a PCN reaction that required hospitalization No Has patient had a PCN reaction occurring within the last 10 years: No If all of the above answers are "NO", then may proceed with Cephalosporin use.    Social History   Tobacco Use   Smoking  status: Former    Current packs/day: 0.00    Average packs/day: 2.0 packs/day for 40.0 years (80.0 ttl pk-yrs)    Types: Cigarettes    Start date: 04/10/1973    Quit date: 04/10/2013    Years since quitting: 10.3   Smokeless tobacco: Never  Substance Use Topics   Alcohol use: Not Currently    Family History  Problem Relation Age of Onset   Stroke Mother    Clotting disorder Neg Hx      Review of Systems  Objective:  Physical Exam Vitals reviewed.  Constitutional:      Appearance: Normal appearance.  HENT:     Head: Normocephalic and atraumatic.  Eyes:     Extraocular Movements: Extraocular movements intact.     Pupils: Pupils are equal, round, and reactive to light.  Cardiovascular:     Rate and Rhythm: Normal rate.  Pulmonary:      Effort: Pulmonary effort is normal.     Breath sounds: Normal breath sounds.  Abdominal:     Palpations: Abdomen is soft.  Musculoskeletal:     Cervical back: Normal range of motion and neck supple.     Left knee: Effusion, bony tenderness and crepitus present. Decreased range of motion. Tenderness present over the medial joint line and lateral joint line. Abnormal alignment.  Neurological:     Mental Status: She is alert and oriented to person, place, and time.  Psychiatric:        Behavior: Behavior normal.     Vital signs in last 24 hours:    Labs:   Estimated body mass index is 32.88 kg/m as calculated from the following:   Height as of 08/24/23: 5\' 3"  (1.6 m).   Weight as of 08/24/23: 84.2 kg.   Imaging Review Plain radiographs demonstrate severe degenerative joint disease of the left knee(s). The overall alignment issignificant valgus. The bone quality appears to be good for age and reported activity level.      Assessment/Plan:  End stage arthritis, left knee   The patient history, physical examination, clinical judgment of the provider and imaging studies are consistent with end stage degenerative joint disease of the left knee(s) and total knee arthroplasty is deemed medically necessary. The treatment options including medical management, injection therapy arthroscopy and arthroplasty were discussed at length. The risks and benefits of total knee arthroplasty were presented and reviewed. The risks due to aseptic loosening, infection, stiffness, patella tracking problems, thromboembolic complications and other imponderables were discussed. The patient acknowledged the explanation, agreed to proceed with the plan and consent was signed. Patient is being admitted for inpatient treatment for surgery, pain control, PT, OT, prophylactic antibiotics, VTE prophylaxis, progressive ambulation and ADL's and discharge planning. The patient is planning to be discharged home with home  health services

## 2023-08-30 NOTE — Care Plan (Signed)
 [facility] RNCM call to patient to discuss her upcoming Left total knee arthroplasty with Dr. Lucienne Ryder at [facility] on 08/31/23. She is agreeable to case management. She lives with her husband and has a daughter than can assist as well after discharge. She has a RW and a rollator. She did mention a 3in1/BSC, but asked about cost and thinks she is going to get from [facility] instead. Anticipate HHPT after short hospital stay. Referral made to [facility] after choice provided. Reviewed post op care instructions. Will continue to follow for needs.

## 2023-08-31 ENCOUNTER — Encounter (HOSPITAL_COMMUNITY): Payer: Self-pay | Admitting: Orthopaedic Surgery

## 2023-08-31 ENCOUNTER — Other Ambulatory Visit: Payer: Self-pay

## 2023-08-31 ENCOUNTER — Encounter (HOSPITAL_COMMUNITY)
Admission: AD | Disposition: A | Discharge status: Skilled Nursing Facility | Payer: Self-pay | Source: Home / Self Care | Attending: Orthopaedic Surgery

## 2023-08-31 ENCOUNTER — Observation Stay (HOSPITAL_COMMUNITY)

## 2023-08-31 ENCOUNTER — Ambulatory Visit (HOSPITAL_COMMUNITY): Admitting: Anesthesiology

## 2023-08-31 ENCOUNTER — Inpatient Hospital Stay (HOSPITAL_COMMUNITY)
Admission: AD | Admit: 2023-08-31 | Discharge: 2023-09-06 | DRG: 470 | Disposition: A | Discharge status: Skilled Nursing Facility | Attending: Orthopaedic Surgery | Admitting: Orthopaedic Surgery

## 2023-08-31 ENCOUNTER — Ambulatory Visit (HOSPITAL_COMMUNITY): Payer: Self-pay | Admitting: Physician Assistant

## 2023-08-31 DIAGNOSIS — Z8673 Personal history of transient ischemic attack (TIA), and cerebral infarction without residual deficits: Secondary | ICD-10-CM | POA: Diagnosis not present

## 2023-08-31 DIAGNOSIS — Z79899 Other long term (current) drug therapy: Secondary | ICD-10-CM

## 2023-08-31 DIAGNOSIS — G2581 Restless legs syndrome: Secondary | ICD-10-CM | POA: Diagnosis not present

## 2023-08-31 DIAGNOSIS — Z823 Family history of stroke: Secondary | ICD-10-CM

## 2023-08-31 DIAGNOSIS — K219 Gastro-esophageal reflux disease without esophagitis: Secondary | ICD-10-CM | POA: Diagnosis present

## 2023-08-31 DIAGNOSIS — E785 Hyperlipidemia, unspecified: Secondary | ICD-10-CM | POA: Diagnosis not present

## 2023-08-31 DIAGNOSIS — G894 Chronic pain syndrome: Secondary | ICD-10-CM | POA: Diagnosis present

## 2023-08-31 DIAGNOSIS — E119 Type 2 diabetes mellitus without complications: Secondary | ICD-10-CM

## 2023-08-31 DIAGNOSIS — E1169 Type 2 diabetes mellitus with other specified complication: Secondary | ICD-10-CM | POA: Diagnosis present

## 2023-08-31 DIAGNOSIS — Z96652 Presence of left artificial knee joint: Secondary | ICD-10-CM

## 2023-08-31 DIAGNOSIS — I252 Old myocardial infarction: Secondary | ICD-10-CM | POA: Diagnosis not present

## 2023-08-31 DIAGNOSIS — M1712 Unilateral primary osteoarthritis, left knee: Secondary | ICD-10-CM | POA: Diagnosis not present

## 2023-08-31 DIAGNOSIS — Z87891 Personal history of nicotine dependence: Secondary | ICD-10-CM | POA: Diagnosis not present

## 2023-08-31 DIAGNOSIS — F418 Other specified anxiety disorders: Secondary | ICD-10-CM | POA: Diagnosis not present

## 2023-08-31 DIAGNOSIS — M179 Osteoarthritis of knee, unspecified: Secondary | ICD-10-CM | POA: Diagnosis present

## 2023-08-31 DIAGNOSIS — Z96641 Presence of right artificial hip joint: Secondary | ICD-10-CM | POA: Diagnosis present

## 2023-08-31 DIAGNOSIS — Z880 Allergy status to penicillin: Secondary | ICD-10-CM

## 2023-08-31 DIAGNOSIS — Z7901 Long term (current) use of anticoagulants: Secondary | ICD-10-CM

## 2023-08-31 DIAGNOSIS — Z7902 Long term (current) use of antithrombotics/antiplatelets: Secondary | ICD-10-CM | POA: Diagnosis not present

## 2023-08-31 DIAGNOSIS — Z7952 Long term (current) use of systemic steroids: Secondary | ICD-10-CM | POA: Diagnosis not present

## 2023-08-31 DIAGNOSIS — R110 Nausea: Secondary | ICD-10-CM | POA: Diagnosis present

## 2023-08-31 DIAGNOSIS — Z471 Aftercare following joint replacement surgery: Secondary | ICD-10-CM | POA: Diagnosis not present

## 2023-08-31 LAB — PROTIME-INR
INR: 1 (ref 0.8–1.2)
Prothrombin Time: 13.8 s (ref 11.4–15.2)

## 2023-08-31 LAB — APTT: aPTT: 32 s (ref 24–36)

## 2023-08-31 SURGERY — ARTHROPLASTY, KNEE, TOTAL
Anesthesia: General | Site: Knee | Laterality: Left

## 2023-08-31 MED ORDER — PHENOL 1.4 % MT LIQD: 1.0000 | OROMUCOSAL | Status: DC | PRN

## 2023-08-31 MED ORDER — HYDROMORPHONE HCL 1 MG/ML IJ SOLN
INTRAMUSCULAR | Status: DC | PRN
  Administered 2023-08-31: .5 mg via INTRAVENOUS

## 2023-08-31 MED ORDER — PREDNISONE 5 MG PO TABS
5.0000 mg | ORAL_TABLET | Freq: Every day | ORAL | Status: DC
  Administered 2023-09-01 – 2023-09-06 (×6): 5 mg via ORAL
  Filled 2023-08-31 (×6): qty 1

## 2023-08-31 MED ORDER — CHLORHEXIDINE GLUCONATE 4 % EX SOLN: 1.0000 | CUTANEOUS | 1 refills | Status: DC

## 2023-08-31 MED ORDER — PHENYLEPHRINE 80 MCG/ML (10ML) SYRINGE FOR IV PUSH (FOR BLOOD PRESSURE SUPPORT)
PREFILLED_SYRINGE | INTRAVENOUS | Status: DC | PRN
  Administered 2023-08-31 (×2): 80 ug via INTRAVENOUS

## 2023-08-31 MED ORDER — BUPIVACAINE-EPINEPHRINE (PF) 0.25% -1:200000 IJ SOLN
INTRAMUSCULAR | Status: AC
Start: 2023-08-31 — End: ?
  Filled 2023-08-31: qty 30

## 2023-08-31 MED ORDER — OXYCODONE HCL 5 MG PO TABS
ORAL_TABLET | ORAL | Status: AC
  Filled 2023-08-31: qty 1

## 2023-08-31 MED ORDER — FENTANYL CITRATE (PF) 250 MCG/5ML IJ SOLN
INTRAMUSCULAR | Status: AC
Start: 2023-08-31 — End: ?
  Filled 2023-08-31: qty 5

## 2023-08-31 MED ORDER — ACETAMINOPHEN 325 MG PO TABS
325.0000 mg | ORAL_TABLET | Freq: Four times a day (QID) | ORAL | Status: DC | PRN
  Administered 2023-09-01 – 2023-09-02 (×4): 650 mg via ORAL
  Filled 2023-08-31 (×4): qty 2

## 2023-08-31 MED ORDER — PHENYLEPHRINE HCL-NACL 20-0.9 MG/250ML-% IV SOLN
INTRAVENOUS | Status: DC | PRN
  Administered 2023-08-31: 20 ug/min via INTRAVENOUS

## 2023-08-31 MED ORDER — MUPIROCIN 2 % EX OINT: 1.0000 | TOPICAL_OINTMENT | Freq: Two times a day (BID) | CUTANEOUS | 0 refills | Status: AC

## 2023-08-31 MED ORDER — CLOPIDOGREL BISULFATE 75 MG PO TABS
75.0000 mg | ORAL_TABLET | Freq: Every day | ORAL | Status: DC
  Administered 2023-09-01 – 2023-09-06 (×6): 75 mg via ORAL
  Filled 2023-08-31 (×6): qty 1

## 2023-08-31 MED ORDER — ESCITALOPRAM OXALATE 10 MG PO TABS
20.0000 mg | ORAL_TABLET | Freq: Every day | ORAL | Status: DC
  Administered 2023-08-31 – 2023-09-06 (×7): 20 mg via ORAL
  Filled 2023-08-31 (×7): qty 2

## 2023-08-31 MED ORDER — FENTANYL CITRATE (PF) 100 MCG/2ML IJ SOLN
INTRAMUSCULAR | Status: AC
  Filled 2023-08-31: qty 2

## 2023-08-31 MED ORDER — ROCURONIUM BROMIDE 10 MG/ML (PF) SYRINGE
PREFILLED_SYRINGE | INTRAVENOUS | Status: DC | PRN
  Administered 2023-08-31: 20 mg via INTRAVENOUS
  Administered 2023-08-31: 50 mg via INTRAVENOUS

## 2023-08-31 MED ORDER — CEFAZOLIN SODIUM-DEXTROSE 2-4 GM/100ML-% IV SOLN
2.0000 g | INTRAVENOUS | Status: AC
  Administered 2023-08-31: 2 g via INTRAVENOUS
  Filled 2023-08-31: qty 100

## 2023-08-31 MED ORDER — ONDANSETRON HCL 4 MG/2ML IJ SOLN
INTRAMUSCULAR | Status: DC | PRN
  Administered 2023-08-31: 4 mg via INTRAVENOUS

## 2023-08-31 MED ORDER — SODIUM CHLORIDE 0.9 % IR SOLN
Status: DC | PRN
  Administered 2023-08-31: 1000 mL

## 2023-08-31 MED ORDER — OXYCODONE HCL 5 MG PO TABS
5.0000 mg | ORAL_TABLET | ORAL | Status: DC | PRN
  Administered 2023-08-31: 10 mg via ORAL
  Administered 2023-08-31: 5 mg via ORAL
  Administered 2023-09-01 (×2): 10 mg via ORAL
  Administered 2023-09-01: 5 mg via ORAL
  Administered 2023-09-01 – 2023-09-04 (×10): 10 mg via ORAL
  Administered 2023-09-05: 5 mg via ORAL
  Administered 2023-09-05 – 2023-09-06 (×6): 10 mg via ORAL
  Filled 2023-08-31 (×3): qty 2
  Filled 2023-08-31: qty 1
  Filled 2023-08-31 (×17): qty 2

## 2023-08-31 MED ORDER — MENTHOL 3 MG MT LOZG: 1.0000 | LOZENGE | OROMUCOSAL | Status: DC | PRN

## 2023-08-31 MED ORDER — HYDROMORPHONE HCL 1 MG/ML IJ SOLN
1.0000 mg | INTRAMUSCULAR | Status: DC | PRN
  Administered 2023-08-31 – 2023-09-01 (×4): 1 mg via INTRAVENOUS
  Filled 2023-08-31 (×4): qty 1

## 2023-08-31 MED ORDER — POVIDONE-IODINE 10 % EX SWAB
2.0000 | Freq: Once | CUTANEOUS | Status: AC
  Administered 2023-08-31: 2 via TOPICAL

## 2023-08-31 MED ORDER — FAMOTIDINE 20 MG PO TABS: 20.0000 mg | ORAL_TABLET | Freq: Every day | ORAL | Status: DC | PRN

## 2023-08-31 MED ORDER — EZETIMIBE 10 MG PO TABS
10.0000 mg | ORAL_TABLET | Freq: Every day | ORAL | Status: DC
  Administered 2023-08-31 – 2023-09-06 (×7): 10 mg via ORAL
  Filled 2023-08-31 (×7): qty 1

## 2023-08-31 MED ORDER — LACTATED RINGERS IV SOLN
INTRAVENOUS | Status: DC
Start: 2023-08-31 — End: 2023-08-31

## 2023-08-31 MED ORDER — SUGAMMADEX SODIUM 200 MG/2ML IV SOLN
INTRAVENOUS | Status: DC | PRN
  Administered 2023-08-31: 200 mg via INTRAVENOUS

## 2023-08-31 MED ORDER — WARFARIN - PHARMACIST DOSING INPATIENT: Freq: Every day | Status: DC

## 2023-08-31 MED ORDER — ACETAMINOPHEN 10 MG/ML IV SOLN
INTRAVENOUS | Status: DC | PRN
  Administered 2023-08-31: 1000 mg via INTRAVENOUS

## 2023-08-31 MED ORDER — SODIUM CHLORIDE 0.9 % IV SOLN: INTRAVENOUS | Status: AC

## 2023-08-31 MED ORDER — FENTANYL CITRATE (PF) 250 MCG/5ML IJ SOLN
INTRAMUSCULAR | Status: DC | PRN
  Administered 2023-08-31: 50 ug via INTRAVENOUS
  Administered 2023-08-31: 100 ug via INTRAVENOUS
  Administered 2023-08-31 (×2): 50 ug via INTRAVENOUS

## 2023-08-31 MED ORDER — MIDAZOLAM HCL 2 MG/2ML IJ SOLN
INTRAMUSCULAR | Status: AC
  Filled 2023-08-31: qty 2

## 2023-08-31 MED ORDER — HYDROMORPHONE HCL 1 MG/ML IJ SOLN
INTRAMUSCULAR | Status: AC
Start: 2023-08-31 — End: 2023-09-01
  Filled 2023-08-31: qty 1

## 2023-08-31 MED ORDER — TRANEXAMIC ACID-NACL 1000-0.7 MG/100ML-% IV SOLN
1000.0000 mg | INTRAVENOUS | Status: AC
  Administered 2023-08-31: 1000 mg via INTRAVENOUS
  Filled 2023-08-31: qty 100

## 2023-08-31 MED ORDER — PROPOFOL 10 MG/ML IV BOLUS
INTRAVENOUS | Status: DC | PRN
  Administered 2023-08-31: 20 mg via INTRAVENOUS
  Administered 2023-08-31: 110 mg via INTRAVENOUS

## 2023-08-31 MED ORDER — CHLORHEXIDINE GLUCONATE 0.12 % MT SOLN
15.0000 mL | Freq: Once | OROMUCOSAL | Status: AC
  Administered 2023-08-31: 15 mL via OROMUCOSAL
  Filled 2023-08-31: qty 15

## 2023-08-31 MED ORDER — TIZANIDINE HCL 4 MG PO TABS
4.0000 mg | ORAL_TABLET | Freq: Four times a day (QID) | ORAL | Status: DC | PRN
Start: 2023-08-31 — End: 2023-09-06
  Administered 2023-09-01 – 2023-09-02 (×5): 4 mg via ORAL
  Filled 2023-08-31 (×5): qty 1

## 2023-08-31 MED ORDER — LIDOCAINE 2% (20 MG/ML) 5 ML SYRINGE
INTRAMUSCULAR | Status: DC | PRN
Start: 2023-08-31 — End: 2023-08-31
  Administered 2023-08-31: 80 mg via INTRAVENOUS

## 2023-08-31 MED ORDER — DIPHENHYDRAMINE HCL 12.5 MG/5ML PO ELIX
12.5000 mg | ORAL_SOLUTION | ORAL | Status: DC | PRN
  Administered 2023-09-02 – 2023-09-06 (×3): 25 mg via ORAL
  Filled 2023-08-31 (×3): qty 10

## 2023-08-31 MED ORDER — HYDROMORPHONE HCL 1 MG/ML IJ SOLN
0.2500 mg | INTRAMUSCULAR | Status: DC | PRN
Start: 2023-08-31 — End: 2023-08-31
  Administered 2023-08-31: 0.25 mg via INTRAVENOUS

## 2023-08-31 MED ORDER — PROPOFOL 10 MG/ML IV BOLUS
INTRAVENOUS | Status: AC
  Filled 2023-08-31: qty 20

## 2023-08-31 MED ORDER — ROPINIROLE HCL 0.5 MG PO TABS
3.0000 mg | ORAL_TABLET | Freq: Every day | ORAL | Status: DC
Start: 2023-09-01 — End: 2023-09-06
  Administered 2023-09-01 – 2023-09-05 (×5): 3 mg via ORAL
  Filled 2023-08-31 (×5): qty 6

## 2023-08-31 MED ORDER — KETOROLAC TROMETHAMINE 15 MG/ML IJ SOLN
7.5000 mg | Freq: Four times a day (QID) | INTRAMUSCULAR | Status: AC
  Administered 2023-08-31 – 2023-09-01 (×2): 7.5 mg via INTRAVENOUS
  Filled 2023-08-31 (×3): qty 1

## 2023-08-31 MED ORDER — DROPERIDOL 2.5 MG/ML IJ SOLN: 0.6250 mg | Freq: Once | INTRAMUSCULAR | Status: DC | PRN

## 2023-08-31 MED ORDER — WARFARIN SODIUM 2.5 MG PO TABS
3.7500 mg | ORAL_TABLET | Freq: Once | ORAL | Status: DC
  Filled 2023-08-31: qty 1

## 2023-08-31 MED ORDER — 0.9 % SODIUM CHLORIDE (POUR BTL) OPTIME
TOPICAL | Status: DC | PRN
  Administered 2023-08-31: 1000 mL

## 2023-08-31 MED ORDER — BUPIVACAINE-EPINEPHRINE 0.25% -1:200000 IJ SOLN
INTRAMUSCULAR | Status: DC | PRN
  Administered 2023-08-31: 30 mL

## 2023-08-31 MED ORDER — ORAL CARE MOUTH RINSE: 15.0000 mL | Freq: Once | OROMUCOSAL | Status: AC

## 2023-08-31 MED ORDER — HYDROMORPHONE HCL 1 MG/ML IJ SOLN
INTRAMUSCULAR | Status: AC
  Filled 2023-08-31: qty 0.5

## 2023-08-31 MED ORDER — DOCUSATE SODIUM 100 MG PO CAPS
100.0000 mg | ORAL_CAPSULE | Freq: Two times a day (BID) | ORAL | Status: DC
  Administered 2023-08-31 – 2023-09-04 (×8): 100 mg via ORAL
  Filled 2023-08-31 (×9): qty 1

## 2023-08-31 MED ORDER — DEXAMETHASONE SODIUM PHOSPHATE 10 MG/ML IJ SOLN
INTRAMUSCULAR | Status: DC | PRN
  Administered 2023-08-31: 5 mg via INTRAVENOUS

## 2023-08-31 SURGICAL SUPPLY — 57 items
BAG COUNTER SPONGE SURGICOUNT (BAG) ×1 IMPLANT
BANDAGE ESMARK 6X9 LF (GAUZE/BANDAGES/DRESSINGS) ×1 IMPLANT
BLADE SAG 18X100X1.27 (BLADE) ×1 IMPLANT
BLADE SAW SGTL 11.0X1.19X90.0M (BLADE) IMPLANT
BNDG ELASTIC 6INX 5YD STR LF (GAUZE/BANDAGES/DRESSINGS) ×2 IMPLANT
BOWL SMART MIX CTS (DISPOSABLE) IMPLANT
CEMENT BONE R 1X40 (Cement) IMPLANT
COMPONET TIB PS KNEE D 0D LT (Joint) IMPLANT
COOLER ICEMAN CLASSIC (MISCELLANEOUS) ×1 IMPLANT
COVER SURGICAL LIGHT HANDLE (MISCELLANEOUS) ×1 IMPLANT
CUFF TOURN SGL QUICK 42 (TOURNIQUET CUFF) IMPLANT
CUFF TRNQT CYL 34X4.125X (TOURNIQUET CUFF) ×1 IMPLANT
DRAPE EXTREMITY T 121X128X90 (DISPOSABLE) ×1 IMPLANT
DRAPE HALF SHEET 40X57 (DRAPES) ×1 IMPLANT
DRAPE U-SHAPE 47X51 STRL (DRAPES) ×1 IMPLANT
DRSG XEROFORM 1X8 (GAUZE/BANDAGES/DRESSINGS) IMPLANT
DURAPREP 26ML APPLICATOR (WOUND CARE) ×1 IMPLANT
ELECT CAUTERY BLADE 6.4 (BLADE) ×1 IMPLANT
ELECTRODE REM PT RTRN 9FT ADLT (ELECTROSURGICAL) ×1 IMPLANT
FACESHIELD WRAPAROUND (MASK) ×2 IMPLANT
FACESHIELD WRAPAROUND OR TEAM (MASK) ×2 IMPLANT
FEMUR CMTD CCR STD SZ6 L KNEE (Knees) IMPLANT
GAUZE PAD ABD 8X10 STRL (GAUZE/BANDAGES/DRESSINGS) ×1 IMPLANT
GAUZE SPONGE 4X4 12PLY STRL (GAUZE/BANDAGES/DRESSINGS) ×1 IMPLANT
GAUZE XEROFORM 1X8 LF (GAUZE/BANDAGES/DRESSINGS) ×1 IMPLANT
GLOVE BIOGEL PI IND STRL 8 (GLOVE) ×2 IMPLANT
GLOVE ORTHO TXT STRL SZ7.5 (GLOVE) ×1 IMPLANT
GLOVE SURG ORTHO 8.0 STRL STRW (GLOVE) ×1 IMPLANT
GOWN STRL REUS W/ TWL LRG LVL3 (GOWN DISPOSABLE) IMPLANT
GOWN STRL REUS W/ TWL XL LVL3 (GOWN DISPOSABLE) ×2 IMPLANT
IMMOBILIZER KNEE 22 UNIV (SOFTGOODS) ×1 IMPLANT
IV NS 1000ML BAXH (IV SOLUTION) ×1 IMPLANT
KIT BASIN OR (CUSTOM PROCEDURE TRAY) ×1 IMPLANT
KIT TURNOVER KIT B (KITS) ×1 IMPLANT
LINER TIB PS CD/6-9 12 LT (Liner) IMPLANT
MANIFOLD NEPTUNE II (INSTRUMENTS) ×1 IMPLANT
NDL 18GX1X1/2 (RX/OR ONLY) (NEEDLE) IMPLANT
NEEDLE 18GX1X1/2 (RX/OR ONLY) (NEEDLE) IMPLANT
NS IRRIG 1000ML POUR BTL (IV SOLUTION) ×1 IMPLANT
PACK TOTAL JOINT (CUSTOM PROCEDURE TRAY) ×1 IMPLANT
PAD ARMBOARD POSITIONER FOAM (MISCELLANEOUS) ×1 IMPLANT
PAD COLD SHLDR WRAP-ON (PAD) ×1 IMPLANT
PADDING CAST COTTON 6X4 STRL (CAST SUPPLIES) ×1 IMPLANT
PIN DRILL HDLS TROCAR 75 4PK (PIN) IMPLANT
SCREW FEMALE HEX FIX 25X2.5 (ORTHOPEDIC DISPOSABLE SUPPLIES) IMPLANT
SET HNDPC FAN SPRY TIP SCT (DISPOSABLE) ×1 IMPLANT
SET PAD KNEE POSITIONER (MISCELLANEOUS) ×1 IMPLANT
STAPLER VISISTAT 35W (STAPLE) ×1 IMPLANT
STEM POLY PAT PLY 29M KNEE (Knees) IMPLANT
SUCTION TUBE FRAZIER 10FR DISP (SUCTIONS) ×1 IMPLANT
SUT VIC AB 0 CT1 27XBRD ANBCTR (SUTURE) ×1 IMPLANT
SUT VIC AB 1 CT1 27XBRD ANBCTR (SUTURE) ×2 IMPLANT
SUT VIC AB 2-0 CT1 TAPERPNT 27 (SUTURE) ×2 IMPLANT
SYR 50ML LL SCALE MARK (SYRINGE) IMPLANT
TOWEL GREEN STERILE (TOWEL DISPOSABLE) ×1 IMPLANT
TOWEL GREEN STERILE FF (TOWEL DISPOSABLE) ×1 IMPLANT
TRAY CATH INTERMITTENT SS 16FR (CATHETERS) IMPLANT

## 2023-08-31 NOTE — Transfer of Care (Signed)
 Immediate Anesthesia Transfer of Care Note  Patient: [name] Good  Procedure(s) Performed: ARTHROPLASTY, KNEE, TOTAL (Left: Knee)  Patient Location: PACU  Anesthesia Type:General  Level of Consciousness: awake, alert , patient cooperative, and responds to stimulation  Airway & Oxygen Therapy: Patient Spontanous Breathing and Patient connected to face mask oxygen  Post-op Assessment: Report given to RN and Post -op Vital signs reviewed and stable  Post vital signs: Reviewed and stable  Last Vitals:  Vitals Value Taken Time  BP 136/88 08/31/23 1249  Temp    Pulse 100 08/31/23 1252  Resp 19 08/31/23 1252  SpO2 97 % 08/31/23 1252  Vitals shown include unfiled device data.  Last Pain:  Vitals:   08/31/23 1015  TempSrc:   PainSc: 0-No pain         Complications: No notable events documented.

## 2023-08-31 NOTE — Anesthesia Procedure Notes (Signed)
 Procedure Name: Intubation Date/Time: 08/31/2023 10:44 AM  Performed by: Hershall Lory, CRNAPre-anesthesia Checklist: Patient identified, Emergency Drugs available, Suction available and Patient being monitored Patient Re-evaluated:Patient Re-evaluated prior to induction Oxygen Delivery Method: Circle system utilized Preoxygenation: Pre-oxygenation with 100% oxygen Induction Type: IV induction Ventilation: Mask ventilation without difficulty Laryngoscope Size: Mac and 3 Grade View: Grade II Tube type: Oral Tube size: 7.0 mm Number of attempts: 1 Airway Equipment and Method: Stylet and Oral airway Placement Confirmation: ETT inserted through vocal cords under direct vision, positive ETCO2 and breath sounds checked- equal and bilateral Secured at: 20 cm Tube secured with: Tape Dental Injury: Teeth and Oropharynx as per pre-operative assessment

## 2023-08-31 NOTE — Progress Notes (Signed)
 PHARMACY - ANTICOAGULATION CONSULT NOTE  Pharmacy Consult for warfarin Indication:  mesenteric thrombus  Allergies  Allergen Reactions   Amoxicillin Other (See Comments)    Tolerated Zosyn  Has patient had a PCN reaction causing immediate rash, facial/tongue/throat swelling, SOB or lightheadedness with hypotension: No Has patient had a PCN reaction causing severe rash involving mucus membranes or skin necrosis: No Has patient had a PCN reaction that required hospitalization No Has patient had a PCN reaction occurring within the last 10 years: No If all of the above answers are "NO", then may proceed with Cephalosporin use.    Patient Measurements: Height: 5\' 3"  (160 cm) Weight: 84.1 kg (185 lb 6.5 oz) IBW/kg (Calculated) : 52.4 HEPARIN  DW (KG): 71.1  Vital Signs: Temp: 98 F (36.7 C) (05/06 1411) Temp Source: Oral (05/06 1411) BP: 94/74 (05/06 1411) Pulse Rate: 92 (05/06 1411)  Labs: Recent Labs    08/31/23 0806  APTT 32  LABPROT 13.8  INR 1.0    Estimated Creatinine Clearance: 63.4 mL/min (by C-G formula based on SCr of 0.75 mg/dL).   Medical History: Past Medical History:  Diagnosis Date   Anxiety    Arthritis    Depression    GERD (gastroesophageal reflux disease)    Myocardial infarction (HCC) 89   no visits to cardiac dr(thomas kelly) since 92   Stroke (HCC)    Wound dehiscence    lumbar     Assessment: 34 yoF admitted for L knee arthroplasty. Pt on warfarin PTA for hx mesenteric thrombus, held preop, INR today is 1.0. Pharmacy to resume warfarin tonight per surgery.   Goal of Therapy:  INR 2-3 Monitor platelets by anticoagulation protocol: Yes   Plan:  Warfarin 3.75mg  PO x1 tonight Daily INR  Levin Reamer, PharmD, BCPS, [facility] Clinical Pharmacist [phone] Please check AMION for all [facility] numbers 08/31/2023

## 2023-08-31 NOTE — Progress Notes (Signed)
 Marysue Sola, RN went in to give patient her coumadin  and her Toradol  and the patient told him she did not want to take it. She stated that she has been taking it for a while at home but she does not like how it made her feels. She also stated that she did not want the Toradol  because it did not work. RN called Dr. Arvella Bird office and left note with the answering service to get a call back so I can notify him about pt not taking her blood thinner.

## 2023-08-31 NOTE — Discharge Instructions (Signed)

## 2023-08-31 NOTE — Op Note (Signed)
 Operative Note  Date of operation: 08/31/2023 Preoperative diagnosis: Left knee primary osteoarthritis Postoperative diagnosis: Same  Procedure: Left cemented total knee arthroplasty  Implants: Biomet/Zimmer persona cemented knee system Implant Name Type Inv. Item Serial No. Manufacturer Lot No. LRB No. Used Action  CEMENT BONE R 1X40 - [id number] Cement CEMENT BONE R 1X40  ZIMMER RECON(ORTH,TRAU,BIO,SG) [id number] Left 1 Implanted  CEMENT BONE R 1X40 - [id number] Cement CEMENT BONE R 1X40  ZIMMER RECON(ORTH,TRAU,BIO,SG) [id number] Left 1 Implanted  STEM POLY PAT PLY 57M KNEE - [id number] Knees STEM POLY PAT PLY 57M KNEE  ZIMMER RECON(ORTH,TRAU,BIO,SG) [id number] Left 1 Implanted  FEMUR CMTD CCR STD SZ6 L KNEE - [id number] Knees FEMUR CMTD CCR STD SZ6 L KNEE  ZIMMER RECON(ORTH,TRAU,BIO,SG) [id number] Left 1 Implanted  LINER TIB PS CD/6-9 12 LT - [id number] Liner LINER TIB PS CD/6-9 12 LT  ZIMMER RECON(ORTH,TRAU,BIO,SG) [id number] Left 1 Implanted  COMPONET TIB PS KNEE D 0D LT - [id number] Joint COMPONET TIB PS KNEE D 0D LT  ZIMMER RECON(ORTH,TRAU,BIO,SG) [id number] Left 1 Implanted   Surgeon: Jeanella Milan. Lucienne Ryder MD Assistant: Malena Scull, PA-C  Anesthesia: #1 left lower extremity adductor canal block, #2 General, #3 local Tourniquet time: Less than 1 hour EBL: Less than 50 cc Antibiotics: IV Ancef  Complications: None  Indications: The patient is a 74 year old female with debilitating arthritis involving her left knee with a significant valgus deformity/malalignment as well.  This is a minimal document with clinical exam and x-ray findings.  At this point her left knee pain is daily and it is detrimentally vetting her mobility, her quality of life and activities of daily living to the point she does wish proceed with a knee replacement left side.  We talked in length in detail the risks of acute blood loss anemia, nerve vessel injury, fracture, infection, DVT, implant failure and wound healing  issues.  She understands that our goals are hopefully decreased pain, improved mobility, and improved quality of life.  Procedure description: After informed consent was obtained and the appropriate left knee was marked, anesthesia obtained a left lower extremity adductor canal block in the holding room.  The patient was then brought to the operating room and placed supinely operative table where general anesthesia was obtained.  A nonsterile tourniquet placed right upper left thigh and her left thigh, knee, leg ankle and foot were prepped and draped with DuraPrep and sterile drapes including a sterile stockinette.  A timeout was called and she was then apprised of correct patient correct left knee.  An Esmarch was then used to wrap the leg and the tourniquet was inflated to 300 mm of pressure.  With the knee extended a direct midline incision was made over the knee and carried proximally distally.  Dissection was carried down to the joint and a medial parapatellar arthrotomy is made finding a moderate joint effusion and significant cartilage loss in the knee especially lateral compartment with valgus malalignment.  Remnants of the ACL, PCL and medial lateral meniscus were removed.  We removed osteophytes in all of the compartments.  Next with the knee in a flexed position an extramedullary based cutting guide was used for proximal tibia cut correction for varus and valgus and a 3 degree slope.  We made this cut to take 2 mm off the low side and made the cut without difficulty.  We then used a intramedullary based cutting guide for distal femur cut setting this for left knee at 5 degrees externally rotated and a 10 mm distal femoral  cut.  We made that cut without difficulty and brought the knee back down to full extension and with a 10 mm block we are pleased with extension.  We then backed the femur and chose a femoral sizing guide based off the epicondylar axis and Whitesides line.  Based over this we chose a  size 6 femur.  We put a 4-in-1 cutting block for a size 6 femur and made our anterior and posterior cuts followed by her chamfer cuts.  We then made our femoral box cut.  We then backed the tibia and chose a size D tibial tray for coverage over the tibial plateau setting the rotation of the tibial tubercle and the femur.  We did our drill hole and keel punch off of this.  Next we trialed our size D left tibial tray followed by our size 6 left PS standard femur.  We trialed a 10 mm thickness PS polythene insert and went up to 12 mm thickness insert we are pleased with range of motion and stability without insert.  We performed a lateral release and then made our patella cut and drilled in 3 holes for a size 29 patella button.  Again with all trial instrumentation the we are pleased with range of motion and stability.  We then removed all trial instrumentation from the knee and irrigate the knee with normal saline solution.  We mixed our cement on the back table and with the knee in a flexed position cemented our Biomet/Zimmer persona tibial tray for a left knee size D followed by cementing our size 6 left PS standard femur.  We placed our 12 mm thickness PS polythene insert and cemented our size 29 patella button.  Before cementing the components in place we did place Marcaine  with epinephrine around the arthrotomy.  We then held the knee fully extended and compressed while the cemented hardened.  Once it hardened excess cement debris was removed from the knee.  The tourniquet was let down and hemostasis was obtained with electrocautery.  The arthrotomy was then closed with interrupted #1 Vicryl suture followed by 0 Vicryl close deep tissue and 2-0 Vicryl close subcutaneous tissue.  The skin was closed with staples.  Well-padded sterile dressing was applied.  The patient was awakened, extubated and taken the recovery room.  Malena Scull, PA-C did assist during entire case and beginning to end and assistance was crucial  and medically necessary for soft tissue management and retraction, helping guide implant placement and a layered closure of the wound.

## 2023-08-31 NOTE — Evaluation (Signed)
 Physical Therapy Evaluation Patient Details Name: [name] Good MRN: [id number] DOB: 01-Nov-1950 Today's Date: 08/31/2023  History of Present Illness  Patient is a 73 y/o female admitted for L TKA.  PMH DM2, CVA, previous lumbar lamy, RLS, R THA, anxiety, arthritis, GERD, MI.  Clinical Impression  Patient presents with decreased mobility due to pain in L knee, difficulty getting full ROM extension and with 30 degrees AAROM flexion and tendency to roll leg out preventing knee extension despite knee immobilizer.  She had previously been ambulatory with cane at home reports limited space for walker at home.  She has spouse and daughter to assist at d/c.  PT will continue to follow.  Follow up recommendations per MD, though currently not appropriate for home, hopeful for improvement prior to d/c.         If plan is discharge home, recommend the following: A lot of help with bathing/dressing/bathroom;A lot of help with walking and/or transfers;Assist for transportation;Help with stairs or ramp for entrance   Can travel by private vehicle        Equipment Recommendations    Recommendations for Other Services       Functional Status Assessment Patient has had a recent decline in their functional status and demonstrates the ability to make significant improvements in function in a reasonable and predictable amount of time.     Precautions / Restrictions Precautions Precautions: Fall;Knee Recall of Precautions/Restrictions: Intact Required Braces or Orthoses: Knee Immobilizer - Left Restrictions Weight Bearing Restrictions Per Provider Order: No Other Position/Activity Restrictions: WBAT      Mobility  Bed Mobility Overal bed mobility: Needs Assistance Bed Mobility: Sit to Supine, Rolling, Sidelying to Sit Rolling: Min assist, Used rails Sidelying to sit: Max assist, HOB elevated   Sit to supine: Mod assist   General bed mobility comments: difficulty sitting upright initially so  encouraged to roll and use rail though pt with difficulty lifting trunk upright reporting burning pain in L LE    Transfers                   General transfer comment: declined to attempt    Ambulation/Gait                  Stairs            Wheelchair Mobility     Tilt Bed    Modified Rankin (Stroke Patients Only)       Balance Overall balance assessment: Needs assistance   Sitting balance-Leahy Scale: Poor Sitting balance - Comments: did not want to scoot to EOB or sit fully upright, pt leaning to L toward pillow and despite PT efforts to assist to lift trunk, pt continued to lean toward pillow                                     Pertinent Vitals/Pain Pain Assessment Pain Assessment: 0-10 Pain Score: 8  Pain Location: L knee Pain Descriptors / Indicators: Aching, Sharp Pain Intervention(s): Monitored during session    Home Living Family/patient expects to be discharged to:: Private residence Living Arrangements: Spouse/significant other Available Help at Discharge: Family Type of Home: Mobile home Home Access: Stairs to enter Entrance Stairs-Rails: Left Entrance Stairs-Number of Steps: 4   Home Layout: One level Home Equipment: Agricultural consultant (2 wheels);Cane - single point;Tub bench;Grab bars - tub/shower;Hand held shower head Additional Comments: has 3:1 on order  Prior Function Prior Level of Function : Independent/Modified Independent             Mobility Comments: cane for ambulation ADLs Comments: completes all BADL's independent, does some IADL's, does not drive     Extremity/Trunk Assessment   Upper Extremity Assessment Upper Extremity Assessment: Generalized weakness    Lower Extremity Assessment Lower Extremity Assessment: LLE deficits/detail LLE Deficits / Details: slow and limited ankle AROM reporting pain in knee; knee flexion AAROM about 30, extension to about -15; able to initiate quad set, but  hard to sustain, hip flexion 2-/5       Communication   Communication Communication: No apparent difficulties    Cognition Arousal: Alert, Lethargic Behavior During Therapy: WFL for tasks assessed/performed   PT - Cognitive impairments: Attention                       PT - Cognition Comments: limited by lethargy initially, then pain focused with mobility Following commands: Intact, Impaired Following commands impaired: Follows one step commands with increased time, Only follows one step commands consistently     Cueing Cueing Techniques: Verbal cues     General Comments General comments (skin integrity, edema, etc.): daughter in the room and encouraging her mom to work with PT; SpO2 99% on 1.5LO2    Exercises Total Joint Exercises Ankle Circles/Pumps: AROM, Both, 5 reps, Supine, AAROM (increased time with AAROM for L LE) Quad Sets: AROM, Both, 5 reps, Supine Heel Slides: AAROM, Left, 5 reps, Supine   Assessment/Plan    PT Assessment Patient needs continued PT services  PT Problem List Decreased strength;Decreased range of motion;Decreased activity tolerance;Decreased balance;Decreased knowledge of use of DME;Pain;Decreased mobility;Decreased safety awareness       PT Treatment Interventions DME instruction;Therapeutic exercise;Gait training;Balance training;Stair training;Therapeutic activities;Patient/family education;Functional mobility training    PT Goals (Current goals can be found in the Care Plan section)  Acute Rehab PT Goals Patient Stated Goal: return home PT Goal Formulation: With patient/family Time For Goal Achievement: 09/07/23 Potential to Achieve Goals: Fair    Frequency 7X/week     Co-evaluation               AM-PAC PT "6 Clicks" Mobility  Outcome Measure Help needed turning from your back to your side while in a flat bed without using bedrails?: A Lot Help needed moving from lying on your back to sitting on the side of a flat  bed without using bedrails?: Total Help needed moving to and from a bed to a chair (including a wheelchair)?: Total Help needed standing up from a chair using your arms (e.g., wheelchair or bedside chair)?: Total Help needed to walk in hospital room?: Total Help needed climbing 3-5 steps with a railing? : Total 6 Click Score: 7    End of Session Equipment Utilized During Treatment: Gait belt Activity Tolerance: Patient limited by pain Patient left: in bed;with call bell/phone within reach;with bed alarm set;with family/visitor present;with nursing/sitter in room Nurse Communication: Mobility status PT Visit Diagnosis: Difficulty in walking, not elsewhere classified (R26.2);Pain Pain - Right/Left: Left Pain - part of body: Knee    Time: 4098-1191 PT Time Calculation (min) (ACUTE ONLY): 28 min   Charges:   PT Evaluation $PT Eval Moderate Complexity: 1 Mod PT Treatments $Therapeutic Activity: 8-22 mins PT General Charges $$ ACUTE PT VISIT: 1 Visit         Abigail Hoff, PT Acute Rehabilitation Services Office:[phone] 08/31/2023   [name]  08/31/2023, 4:52 PM

## 2023-08-31 NOTE — Progress Notes (Signed)
 Notified Dr. Lucienne Ryder that pt. Last dose of plavix  was Friday Aug 27 2023.

## 2023-08-31 NOTE — Interval H&P Note (Signed)
 History and Physical Interval Note: The patient is here today for a left total knee replacement to treat her significant left knee arthritis and pain.  There has been no acute or interval change in her medical status.  She did just stop her Plavix  on Friday so they would only be able to perform general anesthesia today.  I spoke with anesthesia about this and they are comfortable with proceeding with general anesthesia after reviewing her medical status.  The risks and benefits of surgery were discussed to her in detail and informed consent has been obtained.  The left operative knee has been marked.  08/31/2023 9:04 AM  [name] Good  has presented today for surgery, with the diagnosis of osteoarthritis left knee.  The various methods of treatment have been discussed with the patient and family. After consideration of risks, benefits and other options for treatment, the patient has consented to  Procedure(s): ARTHROPLASTY, KNEE, TOTAL (Left) as a surgical intervention.  The patient's history has been reviewed, patient examined, no change in status, stable for surgery.  I have reviewed the patient's chart and labs.  Questions were answered to the patient's satisfaction.     Arnie Lao

## 2023-09-01 ENCOUNTER — Encounter (HOSPITAL_COMMUNITY): Payer: Self-pay | Admitting: Orthopaedic Surgery

## 2023-09-01 DIAGNOSIS — Z96652 Presence of left artificial knee joint: Secondary | ICD-10-CM | POA: Diagnosis not present

## 2023-09-01 DIAGNOSIS — M25562 Pain in left knee: Secondary | ICD-10-CM | POA: Diagnosis not present

## 2023-09-01 DIAGNOSIS — I2510 Atherosclerotic heart disease of native coronary artery without angina pectoris: Secondary | ICD-10-CM | POA: Diagnosis not present

## 2023-09-01 DIAGNOSIS — I959 Hypotension, unspecified: Secondary | ICD-10-CM | POA: Diagnosis not present

## 2023-09-01 DIAGNOSIS — Z880 Allergy status to penicillin: Secondary | ICD-10-CM | POA: Diagnosis not present

## 2023-09-01 DIAGNOSIS — M1712 Unilateral primary osteoarthritis, left knee: Secondary | ICD-10-CM | POA: Diagnosis not present

## 2023-09-01 DIAGNOSIS — Z7401 Bed confinement status: Secondary | ICD-10-CM | POA: Diagnosis not present

## 2023-09-01 DIAGNOSIS — R110 Nausea: Secondary | ICD-10-CM | POA: Diagnosis present

## 2023-09-01 DIAGNOSIS — Z7901 Long term (current) use of anticoagulants: Secondary | ICD-10-CM | POA: Diagnosis not present

## 2023-09-01 DIAGNOSIS — E1169 Type 2 diabetes mellitus with other specified complication: Secondary | ICD-10-CM | POA: Diagnosis present

## 2023-09-01 DIAGNOSIS — I252 Old myocardial infarction: Secondary | ICD-10-CM | POA: Diagnosis not present

## 2023-09-01 DIAGNOSIS — D649 Anemia, unspecified: Secondary | ICD-10-CM | POA: Diagnosis not present

## 2023-09-01 DIAGNOSIS — Z87891 Personal history of nicotine dependence: Secondary | ICD-10-CM | POA: Diagnosis not present

## 2023-09-01 DIAGNOSIS — Z7952 Long term (current) use of systemic steroids: Secondary | ICD-10-CM | POA: Diagnosis not present

## 2023-09-01 DIAGNOSIS — E46 Unspecified protein-calorie malnutrition: Secondary | ICD-10-CM | POA: Diagnosis not present

## 2023-09-01 DIAGNOSIS — Z8673 Personal history of transient ischemic attack (TIA), and cerebral infarction without residual deficits: Secondary | ICD-10-CM | POA: Diagnosis not present

## 2023-09-01 DIAGNOSIS — T7840XD Allergy, unspecified, subsequent encounter: Secondary | ICD-10-CM | POA: Diagnosis not present

## 2023-09-01 DIAGNOSIS — I739 Peripheral vascular disease, unspecified: Secondary | ICD-10-CM | POA: Diagnosis not present

## 2023-09-01 DIAGNOSIS — K5900 Constipation, unspecified: Secondary | ICD-10-CM | POA: Diagnosis not present

## 2023-09-01 DIAGNOSIS — E785 Hyperlipidemia, unspecified: Secondary | ICD-10-CM | POA: Diagnosis not present

## 2023-09-01 DIAGNOSIS — R112 Nausea with vomiting, unspecified: Secondary | ICD-10-CM | POA: Diagnosis not present

## 2023-09-01 DIAGNOSIS — Z96641 Presence of right artificial hip joint: Secondary | ICD-10-CM | POA: Diagnosis present

## 2023-09-01 DIAGNOSIS — M62838 Other muscle spasm: Secondary | ICD-10-CM | POA: Diagnosis not present

## 2023-09-01 DIAGNOSIS — Z471 Aftercare following joint replacement surgery: Secondary | ICD-10-CM | POA: Diagnosis not present

## 2023-09-01 DIAGNOSIS — G894 Chronic pain syndrome: Secondary | ICD-10-CM | POA: Diagnosis present

## 2023-09-01 DIAGNOSIS — Z79899 Other long term (current) drug therapy: Secondary | ICD-10-CM | POA: Diagnosis not present

## 2023-09-01 DIAGNOSIS — Z7902 Long term (current) use of antithrombotics/antiplatelets: Secondary | ICD-10-CM | POA: Diagnosis not present

## 2023-09-01 DIAGNOSIS — G2581 Restless legs syndrome: Secondary | ICD-10-CM | POA: Diagnosis not present

## 2023-09-01 DIAGNOSIS — Z823 Family history of stroke: Secondary | ICD-10-CM | POA: Diagnosis not present

## 2023-09-01 DIAGNOSIS — M1990 Unspecified osteoarthritis, unspecified site: Secondary | ICD-10-CM | POA: Diagnosis not present

## 2023-09-01 DIAGNOSIS — R2681 Unsteadiness on feet: Secondary | ICD-10-CM | POA: Diagnosis not present

## 2023-09-01 DIAGNOSIS — D692 Other nonthrombocytopenic purpura: Secondary | ICD-10-CM | POA: Diagnosis not present

## 2023-09-01 DIAGNOSIS — K219 Gastro-esophageal reflux disease without esophagitis: Secondary | ICD-10-CM | POA: Diagnosis not present

## 2023-09-01 LAB — BASIC METABOLIC PANEL WITH GFR
Anion gap: 8 (ref 5–15)
BUN: 11 mg/dL (ref 8–23)
CO2: 22 mmol/L (ref 22–32)
Calcium: 8.4 mg/dL — ABNORMAL LOW (ref 8.9–10.3)
Chloride: 108 mmol/L (ref 98–111)
Creatinine, Ser: 0.91 mg/dL (ref 0.44–1.00)
GFR, Estimated: >60 mL/min
Glucose, Bld: 136 mg/dL — ABNORMAL HIGH (ref 70–99)
Potassium: 4.4 mmol/L (ref 3.5–5.1)
Sodium: 138 mmol/L (ref 135–145)

## 2023-09-01 LAB — CBC
HCT: 29.5 % — ABNORMAL LOW (ref 36.0–46.0)
Hemoglobin: 9.4 g/dL — ABNORMAL LOW (ref 12.0–15.0)
MCH: 31 pg (ref 26.0–34.0)
MCHC: 31.9 g/dL (ref 30.0–36.0)
MCV: 97.4 fL (ref 80.0–100.0)
Platelets: 225 10*3/uL (ref 150–400)
RBC: 3.03 MIL/uL — ABNORMAL LOW (ref 3.87–5.11)
RDW: 13.6 % (ref 11.5–15.5)
WBC: 12.7 10*3/uL — ABNORMAL HIGH (ref 4.0–10.5)
nRBC: 0 % (ref 0.0–0.2)

## 2023-09-01 LAB — PROTIME-INR
INR: 1.1 (ref 0.8–1.2)
Prothrombin Time: 13.9 s (ref 11.4–15.2)

## 2023-09-01 MED ORDER — WARFARIN SODIUM 4 MG PO TABS: 4.0000 mg | ORAL_TABLET | Freq: Once | ORAL | Status: DC

## 2023-09-01 MED ORDER — HYDROMORPHONE HCL 2 MG PO TABS
4.0000 mg | ORAL_TABLET | ORAL | Status: DC | PRN
  Administered 2023-09-01 – 2023-09-03 (×6): 4 mg via ORAL
  Filled 2023-09-01 (×6): qty 2

## 2023-09-01 MED ORDER — ENOXAPARIN SODIUM 40 MG/0.4ML IJ SOSY
40.0000 mg | PREFILLED_SYRINGE | Freq: Every day | INTRAMUSCULAR | Status: DC
  Administered 2023-09-01: 40 mg via SUBCUTANEOUS
  Filled 2023-09-01: qty 0.4

## 2023-09-01 MED ORDER — WARFARIN SODIUM 4 MG PO TABS
4.0000 mg | ORAL_TABLET | Freq: Once | ORAL | Status: AC
  Administered 2023-09-01: 4 mg via ORAL
  Filled 2023-09-01: qty 1

## 2023-09-01 MED ORDER — ENOXAPARIN SODIUM 80 MG/0.8ML IJ SOSY
80.0000 mg | PREFILLED_SYRINGE | Freq: Two times a day (BID) | INTRAMUSCULAR | Status: DC
  Administered 2023-09-01: 80 mg via SUBCUTANEOUS
  Filled 2023-09-01 (×2): qty 0.8

## 2023-09-01 NOTE — Anesthesia Postprocedure Evaluation (Signed)
 Anesthesia Post Note  Patient: [name] Good  Procedure(s) Performed: ARTHROPLASTY, KNEE, TOTAL (Left: Knee)     Patient location during evaluation: PACU Anesthesia Type: General Level of consciousness: sedated and patient cooperative Pain management: pain level controlled Vital Signs Assessment: post-procedure vital signs reviewed and stable Respiratory status: spontaneous breathing Cardiovascular status: stable Anesthetic complications: no   No notable events documented.  Last Vitals:  Vitals:   09/01/23 0453 09/01/23 0900  BP: 131/65 (!) 105/47  Pulse: 98 95  Resp: 17 17  Temp: 37.2 C 36.6 C  SpO2: 95% 93%    Last Pain:  Vitals:   09/01/23 0937  TempSrc:   PainSc: 6                  Gorman Laughter

## 2023-09-01 NOTE — Progress Notes (Signed)
 Patient ID: [name] Good, female   DOB: 1950/09/24, 73 y.o.   MRN: [id number] The patient is making very slow progress with mobility.  She is inquiring about the possibility of short-term skilled nursing placement following this hospitalization.  I will make her an inpatient today and have consulted the transitional care team to look into short-term skilled nursing facility placement.

## 2023-09-01 NOTE — Progress Notes (Signed)
 Physical Therapy Treatment Patient Details Name: [name] Good MRN: [id number] DOB: 09-Sep-1950 Today's Date: 09/01/2023   History of Present Illness Patient is a 73 y/o female admitted for L TKA.  PMH DM2, CVA, previous lumbar lamy, RLS, R THA, anxiety, arthritis, GERD, MI.    PT Comments  Patient progressing slowly.  Still limited by pain and generalized weakness.  Able to toilet on [facility] after up in chair since session this am.  Daughter in the room and supportive.  Feel she may need inpatient rehab (<3 hours/day) prior to d/c home as will need to negotiate 5 steps for home entry.  PT will continue to follow.     If plan is discharge home, recommend the following: A lot of help with bathing/dressing/bathroom;A lot of help with walking and/or transfers;Assist for transportation;Help with stairs or ramp for entrance   Can travel by private vehicle        Equipment Recommendations  None recommended by PT    Recommendations for Other Services       Precautions / Restrictions Precautions Precautions: Fall Recall of Precautions/Restrictions: Intact Required Braces or Orthoses: Knee Immobilizer - Left Restrictions Other Position/Activity Restrictions: WBAT     Mobility  Bed Mobility Overal bed mobility: Needs Assistance Bed Mobility: Sit to Supine     Supine to sit: Min assist, HOB elevated Sit to supine: Min assist   General bed mobility comments: A for L LE    Transfers Overall transfer level: Needs assistance Equipment used: Rolling walker (2 wheels) Transfers: Sit to/from Stand Sit to Stand: Min assist           General transfer comment: some lifting help to stand    Ambulation/Gait Ambulation/Gait assistance: Min assist Gait Distance (Feet): 4 Feet Assistive device: Rolling walker (2 wheels) Gait Pattern/deviations: Step-to pattern, Antalgic, Trunk flexed, Decreased stride length       General Gait Details: limited weight tolerance on L LE, though cues and  more upright posture   Stairs             Wheelchair Mobility     Tilt Bed    Modified Rankin (Stroke Patients Only)       Balance Overall balance assessment: Needs assistance Sitting-balance support: Feet supported Sitting balance-Leahy Scale: Fair Sitting balance - Comments: increased time though able to scoot back on bed with UE support   Standing balance support: Bilateral upper extremity supported Standing balance-Leahy Scale: Poor Standing balance comment: A and UE support for static balance                            Communication    Cognition Arousal: Alert Behavior During Therapy: WFL for tasks assessed/performed   PT - Cognitive impairments: Problem solving, No apparent impairments, Safety/Judgement                       PT - Cognition Comments: slow processing Following commands: Intact Following commands impaired: Follows multi-step commands inconsistently, Follows multi-step commands with increased time    Cueing Cueing Techniques: Verbal cues  Exercises Total Joint Exercises Ankle Circles/Pumps: PROM, 5 reps, Both, Supine Quad Sets: AROM, Both, Supine, Other reps (comment) (3) Short Arc Quad: AROM, Left, Other reps (comment), Supine (3 reps) Heel Slides: AAROM, Left, Other reps (comment) (3 reps) Hip ABduction/ADduction: AROM, AAROM, Left, Supine, 5 reps Straight Leg Raises: AAROM, Left, Supine, Other reps (comment) (3 reps)    General Comments  General comments (skin integrity, edema, etc.): daughter in room and encouraging pt; toileted on [facility] with A for hygiene and increased time      Pertinent Vitals/Pain Pain Assessment Pain Assessment: Faces Faces Pain Scale: Hurts even more Pain Location: L knee with movement Pain Descriptors / Indicators: Aching, Guarding, Numbness, Sore, Tiring Pain Intervention(s): Limited activity within patient's tolerance, Repositioned, Monitored during session, PCA encouraged, Ice applied     Home Living                          Prior Function            PT Goals (current goals can now be found in the care plan section) Progress towards PT goals: Progressing toward goals    Frequency    7X/week      PT Plan      Co-evaluation              AM-PAC PT "6 Clicks" Mobility   Outcome Measure  Help needed turning from your back to your side while in a flat bed without using bedrails?: A Little Help needed moving from lying on your back to sitting on the side of a flat bed without using bedrails?: A Little Help needed moving to and from a bed to a chair (including a wheelchair)?: A Little Help needed standing up from a chair using your arms (e.g., wheelchair or bedside chair)?: A Little Help needed to walk in hospital room?: Total Help needed climbing 3-5 steps with a railing? : Total 6 Click Score: 14    End of Session Equipment Utilized During Treatment: Gait belt Activity Tolerance: Patient tolerated treatment well Patient left: in bed;with call bell/phone within reach;with family/visitor present   PT Visit Diagnosis: Difficulty in walking, not elsewhere classified (R26.2);Pain Pain - Right/Left: Left Pain - part of body: Knee     Time: 1455-1525 PT Time Calculation (min) (ACUTE ONLY): 30 min  Charges:    $Gait Training: 8-22 mins $Therapeutic Exercise: 8-22 mins PT General Charges $$ ACUTE PT VISIT: 1 Visit                     Abigail Hoff, PT Acute Rehabilitation Services Office:[phone] 09/01/2023    [name] 09/01/2023, 4:14 PM

## 2023-09-01 NOTE — Progress Notes (Signed)
 Subjective: 1 Day Post-Op Procedure(s) (LRB): ARTHROPLASTY, KNEE, TOTAL (Left) Patient reports pain as severe.  However, patient on large dose chronic narcotics prior to surgery.  Objective: Vital signs in last 24 hours: Temp:  [97.8 F (36.6 C)-99.1 F (37.3 C)] 98.9 F (37.2 C) (05/07 0453) Pulse Rate:  [83-102] 98 (05/07 0453) Resp:  [11-20] 17 (05/07 0453) BP: (94-163)/(55-88) 131/65 (05/07 0453) SpO2:  [91 %-100 %] 95 % (05/07 0453) Weight:  [84.1 kg] 84.1 kg (05/06 0812)  Intake/Output from previous day: 05/06 0701 - 05/07 0700 In: 226.5 [I.V.:126.5; IV Piggyback:100] Out: 2150 [Urine:2050; Blood:100] Intake/Output this shift: No intake/output data recorded.  Recent Labs    09/01/23 0540  HGB 9.4*   Recent Labs    09/01/23 0540  WBC 12.7*  RBC 3.03*  HCT 29.5*  PLT 225   Recent Labs    09/01/23 0540  NA 138  K 4.4  CL 108  CO2 22  BUN 11  CREATININE 0.91  GLUCOSE 136*  CALCIUM  8.4*   Recent Labs    08/31/23 0806 09/01/23 0540  INR 1.0 1.1    Sensation intact distally Intact pulses distally Dorsiflexion/Plantar flexion intact Incision: dressing C/D/I Compartment soft   Assessment/Plan: 1 Day Post-Op Procedure(s) (LRB): ARTHROPLASTY, KNEE, TOTAL (Left) Up with therapy      Arnie Lao 09/01/2023, 7:26 AM

## 2023-09-01 NOTE — Progress Notes (Signed)
 PHARMACY - ANTICOAGULATION CONSULT NOTE  Pharmacy Consult for warfarin Indication:  mesenteric thrombus  Allergies  Allergen Reactions   Amoxicillin Other (See Comments)    Tolerated Zosyn  Has patient had a PCN reaction causing immediate rash, facial/tongue/throat swelling, SOB or lightheadedness with hypotension: No Has patient had a PCN reaction causing severe rash involving mucus membranes or skin necrosis: No Has patient had a PCN reaction that required hospitalization No Has patient had a PCN reaction occurring within the last 10 years: No If all of the above answers are "NO", then may proceed with Cephalosporin use.    Patient Measurements: Height: 5\' 3"  (160 cm) Weight: 84.1 kg (185 lb 6.5 oz) IBW/kg (Calculated) : 52.4 HEPARIN  DW (KG): 71.1  Vital Signs: Temp: 97.8 F (36.6 C) (05/07 0900) Temp Source: Oral (05/07 0900) BP: 105/47 (05/07 0900) Pulse Rate: 95 (05/07 0900)  Labs: Recent Labs    08/31/23 0806 09/01/23 0540  HGB  --  9.4*  HCT  --  29.5*  PLT  --  225  APTT 32  --   LABPROT 13.8 13.9  INR 1.0 1.1  CREATININE  --  0.91    Estimated Creatinine Clearance: 55.7 mL/min (by C-G formula based on SCr of 0.91 mg/dL).   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 Warfarin PTA held for surgery, INR 1.0 on admission. Pharmacy to resume warfarin per surgery.   Warfarin dose 5/6 PM not given. Per RN documentation, patient refused dose. Pharmacy not notified. S/w patient this morning. She clarified it was a misunderstanding and is fine with resuming warfarin. Due to INR ~1 will resume bridging with enoxaparin  per confirmation with MD. Monitor closely for s/sx bleeding, also resumed clopidogrel .   PTA warfarin regimen: 3.75mg  PO all  days  Goal of Therapy:  INR 2-3 Monitor platelets by anticoagulation protocol: Yes   Plan:  Give warfarin 4 mg PO x1 dose - will give dose @ 1200 Enoxaparin  80mg  SQ Q12h while INR subtherapeutic  Check INR daily while on warfarin Continue to monitor H&H and platelets   Thank you for allowing pharmacy to be a part of this patient's care.  Claudia Cuff, PharmD, BCPS Clinical Pharmacist

## 2023-09-01 NOTE — TOC Initial Note (Signed)
 Transition of Care [facility]) - Initial/Assessment Note    Patient Details  Name: [name] Good MRN: [id number] Date of Birth: [birth year]  [facility] CM/SW Contact:    Omie Bickers, RN Phone Number: 09/01/2023, 10:48 AM  Clinical Narrative:                  Eldora Greet w patient at bedside.  She was set pre op with [name] for [facility] services. Hospital liaison notified of probable DC tomorrow (pain today).  Patient states that she has a RW at home and has ordered a [facility] for herself already.   Expected Discharge Plan: Home w Home Health Services Barriers to Discharge: Continued Medical Work up   Patient Goals and CMS Choice Patient states their goals for this hospitalization and ongoing recovery are:: to go home CMS Medicare.gov Compare Post Acute Care list provided to:: Patient Choice offered to / list presented to : Patient      Expected Discharge Plan and Services   Discharge Planning Services: CM Consult Post Acute Care Choice: Durable Medical Equipment, Home Health Living arrangements for the past 2 months: Single Family Home                           HH Arranged: PT, OT HH Agency: [facility] ([name]) Date HH Agency Contacted: 09/01/23 Time HH Agency Contacted: 1048 Representative spoke with at [facility] Agency: Renetta Carter  Prior Living Arrangements/Services Living arrangements for the past 2 months: Single Family Home                     Activities of Daily Living   ADL Screening (condition at time of admission) Independently performs ADLs?: Yes (appropriate for developmental age) Is the patient deaf or have difficulty hearing?: No Does the patient have difficulty seeing, even when wearing glasses/contacts?: No Does the patient have difficulty concentrating, remembering, or making decisions?: No  Permission Sought/Granted                  Emotional Assessment              Admission diagnosis:  Primary osteoarthritis of left knee  [M17.12] OA (osteoarthritis) of knee [M17.9] Status post total left knee replacement [Z96.652] Patient Active Problem List   Diagnosis Date Noted   OA (osteoarthritis) of knee 08/31/2023   Status post total left knee replacement 08/31/2023   Senile purpura (HCC) 08/05/2023   Supratherapeutic INR 07/05/2023   Unilateral primary osteoarthritis, right knee 06/09/2023   Unilateral primary osteoarthritis, left knee 06/09/2023   Vitamin D deficiency 02/16/2023   Dyslipidemia associated with type 2 diabetes mellitus (HCC) 01/01/2023   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   History of CVA (cerebrovascular accident) 12/07/2022   T2DM (type 2 diabetes mellitus) (HCC) 08/11/2022   Chronic pain syndrome 10/30/2021   Morbid obesity (HCC) 09/02/2021   Depression, major, single episode, moderate (HCC) 07/15/2021   Vertebral artery stenosis 07/15/2021   Cerebrovascular disease 07/15/2021   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   RLS (restless legs syndrome) 10/02/2019   Arthritis 10/18/2017   Former smoker 11/13/2016   Autoimmune disease (HCC) 08/12/2016   Obesity (BMI 35.0-39.9 without comorbidity) 08/12/2016   Status post total replacement of right hip 10/25/2015   S/P lumbar laminectomy 06/14/2015   PCP:  Rodney Clamp, MD Pharmacy:   [facility] - [place], [place] - [address] [place] [address] [place] [address] [place]  [place] [address] Phone: [phone] Fax: [phone]  [facility] [address] [place] - [address] [place] [address] Phone: [phone] Fax: [phone]     Social Drivers of Health (SDOH) Social History: SDOH Screenings   Food Insecurity: No Food Insecurity (08/31/2023)  Housing: Low Risk  (08/31/2023)  Transportation Needs: No Transportation Needs (08/31/2023)  Utilities: Not At Risk (08/31/2023)  Alcohol Screen: Low Risk  (04/29/2020)  Depression (PHQ2-9): Medium Risk (08/05/2023)   Financial Resource Strain: Low Risk  (11/05/2022)  Physical Activity: Inactive (11/05/2022)  Social Connections: Moderately Isolated (08/31/2023)  Stress: No Stress Concern Present (11/05/2022)  Tobacco Use: Medium Risk (08/31/2023)  Health Literacy: Adequate Health Literacy (11/05/2022)   SDOH Interventions:     Readmission Risk Interventions     No data to display

## 2023-09-01 NOTE — Progress Notes (Signed)
 Physical Therapy Treatment Patient Details Name: [name] Good MRN: [id number] DOB: 09-Jan-1951 Today's Date: 09/01/2023   History of Present Illness Patient is a 73 y/o female admitted for L TKA.  PMH DM2, CVA, previous lumbar lamy, RLS, R THA, anxiety, arthritis, GERD, MI.    PT Comments  Patient more alert and tolerated up OOB to recliner today.  Still limited by pain and general weakness.  She will benefit from continued skilled PT in the acute setting.  Hopeful for home though would not be ready today.  PT will continue to follow and planned second session today.     If plan is discharge home, recommend the following: A lot of help with bathing/dressing/bathroom;A lot of help with walking and/or transfers;Assist for transportation;Help with stairs or ramp for entrance   Can travel by private vehicle        Equipment Recommendations  None recommended by PT    Recommendations for Other Services       Precautions / Restrictions Precautions Precautions: Fall;Knee Recall of Precautions/Restrictions: Intact Required Braces or Orthoses: Knee Immobilizer - Left Restrictions Weight Bearing Restrictions Per Provider Order: No Other Position/Activity Restrictions: WBAT     Mobility  Bed Mobility Overal bed mobility: Needs Assistance Bed Mobility: Supine to Sit     Supine to sit: Min assist, HOB elevated     General bed mobility comments: cue for scooting forward with increased time, assist for L LE    Transfers Overall transfer level: Needs assistance Equipment used: Rolling walker (2 wheels) Transfers: Sit to/from Stand Sit to Stand: Mod assist           General transfer comment: some lifting help, cues for hand placement    Ambulation/Gait Ambulation/Gait assistance: Min assist Gait Distance (Feet): 4 Feet Assistive device: Rolling walker (2 wheels) Gait Pattern/deviations: Step-to pattern, Decreased stride length, Antalgic       General Gait Details:  shuffling with poor weight tolerance on L LE and cues for posture for walker use for off loading L though scuffing R foot to progress.  Stepping to chair only with increased time and pain limited.   Stairs             Wheelchair Mobility     Tilt Bed    Modified Rankin (Stroke Patients Only)       Balance Overall balance assessment: Needs assistance   Sitting balance-Leahy Scale: Fair     Standing balance support: Bilateral upper extremity supported, Reliant on assistive device for balance Standing balance-Leahy Scale: Poor Standing balance comment: A and UE support for static balance                            Communication Communication Communication: No apparent difficulties  Cognition Arousal: Alert Behavior During Therapy: WFL for tasks assessed/performed   PT - Cognitive impairments: Problem solving                       PT - Cognition Comments: slow processing Following commands: Intact Following commands impaired: Follows one step commands with increased time, Follows multi-step commands with increased time    Cueing Cueing Techniques: Verbal cues  Exercises Total Joint Exercises Ankle Circles/Pumps: AROM, Both, Supine, AAROM, 10 reps Quad Sets: AROM, Both, Supine, Other reps (comment) (8 reps) Short Arc Quad: AROM, Left, Other reps (comment), Supine (8 reps) Heel Slides: AAROM, 10 reps, Supine, Left Hip ABduction/ADduction: AROM, AAROM, Left, 10 reps,  Supine Straight Leg Raises: AAROM, 5 reps, Left, Supine    General Comments        Pertinent Vitals/Pain Pain Assessment Pain Assessment: Faces Faces Pain Scale: Hurts whole lot Pain Location: L knee with movement Pain Descriptors / Indicators: Aching, Sharp Pain Intervention(s): Monitored during session, Premedicated before session, Relaxation, Ice applied    Home Living                          Prior Function            PT Goals (current goals can now be  found in the care plan section) Progress towards PT goals: Progressing toward goals    Frequency    7X/week      PT Plan      Co-evaluation              AM-PAC PT "6 Clicks" Mobility   Outcome Measure  Help needed turning from your back to your side while in a flat bed without using bedrails?: A Little Help needed moving from lying on your back to sitting on the side of a flat bed without using bedrails?: A Little Help needed moving to and from a bed to a chair (including a wheelchair)?: A Little Help needed standing up from a chair using your arms (e.g., wheelchair or bedside chair)?: A Lot Help needed to walk in hospital room?: Total Help needed climbing 3-5 steps with a railing? : Total 6 Click Score: 13    End of Session Equipment Utilized During Treatment: Gait belt Activity Tolerance: Patient tolerated treatment well Patient left: in chair;with call bell/phone within reach   PT Visit Diagnosis: Difficulty in walking, not elsewhere classified (R26.2);Pain Pain - Right/Left: Left Pain - part of body: Knee     Time: 1914-7829 PT Time Calculation (min) (ACUTE ONLY): 29 min  Charges:    $Therapeutic Exercise: 8-22 mins $Therapeutic Activity: 8-22 mins PT General Charges $$ ACUTE PT VISIT: 1 Visit                     Abigail Hoff, PT Acute Rehabilitation Services [phone] 09/01/2023    [name] 09/01/2023, 12:01 PM

## 2023-09-01 NOTE — Care Management Obs Status (Signed)
 MEDICARE OBSERVATION STATUS NOTIFICATION   Patient Details  Name: [name] Good MRN: [id number] Date of Birth: 12-Aug-1950   Medicare Observation Status Notification Given:  Yes    Felix Host 09/01/2023, 10:11 AM

## 2023-09-02 LAB — PROTIME-INR
INR: 1.2 (ref 0.8–1.2)
Prothrombin Time: 15.6 s — ABNORMAL HIGH (ref 11.4–15.2)

## 2023-09-02 MED ORDER — WARFARIN SODIUM 4 MG PO TABS
4.0000 mg | ORAL_TABLET | Freq: Once | ORAL | Status: AC
  Administered 2023-09-02: 4 mg via ORAL
  Filled 2023-09-02: qty 1

## 2023-09-02 MED ORDER — ENOXAPARIN SODIUM 120 MG/0.8ML IJ SOSY
120.0000 mg | PREFILLED_SYRINGE | INTRAMUSCULAR | Status: DC
  Administered 2023-09-02 – 2023-09-03 (×2): 120 mg via SUBCUTANEOUS
  Filled 2023-09-02 (×2): qty 0.8

## 2023-09-02 NOTE — Progress Notes (Signed)
 Subjective: 2 Days Post-Op Procedure(s) (LRB): ARTHROPLASTY, KNEE, TOTAL (Left) Patient reports pain as severe.  Some nausea this AM . Slow progress with PT  Objective: Vital signs in last 24 hours: Temp:  [97.6 F (36.4 C)-98.6 F (37 C)] 98.6 F (37 C) (05/08 0334) Pulse Rate:  [87-95] 95 (05/08 0334) Resp:  [17-18] 17 (05/07 2027) BP: (105-131)/(47-84) 120/53 (05/08 0334) SpO2:  [92 %-100 %] 92 % (05/08 0334)  Intake/Output from previous day: 05/07 0701 - 05/08 0700 In: 720 [P.O.:720] Out: 300 [Urine:300] Intake/Output this shift: No intake/output data recorded.  Recent Labs    09/01/23 0540  HGB 9.4*   Recent Labs    09/01/23 0540  WBC 12.7*  RBC 3.03*  HCT 29.5*  PLT 225   Recent Labs    09/01/23 0540  NA 138  K 4.4  CL 108  CO2 22  BUN 11  CREATININE 0.91  GLUCOSE 136*  CALCIUM  8.4*   Recent Labs    09/01/23 0540 09/02/23 0640  INR 1.1 1.2    Dorsiflexion/Plantar flexion intact Incision: moderate drainage Compartment soft   Assessment/Plan: 2 Days Post-Op Procedure(s) (LRB): ARTHROPLASTY, KNEE, TOTAL (Left) Up with therapy Dressing changed  Most likely will need SNF placement     [name] Good 09/02/2023, 8:50 AM

## 2023-09-02 NOTE — Progress Notes (Signed)
 Physical Therapy Treatment Patient Details Name: [name] Good MRN: [id number] DOB: 01/23/1951 Today's Date: 09/02/2023   History of Present Illness Patient is a 73 y/o female admitted for L TKA.  PMH DM2, CVA, previous lumbar lamy, RLS, R THA, anxiety, arthritis, GERD, MI.    PT Comments  Patient tearful though participating in knee exercises.  Painful despite reports had meds an hour ago.  Patient able to walk a little in the room with encouragement and chair follow.  She fatigues with UE's due to poor weight tolerance on L LE.  Encouraged BSC use while OOB.  PT will continue to follow.     If plan is discharge home, recommend the following: A lot of help with bathing/dressing/bathroom;A lot of help with walking and/or transfers;Assist for transportation;Help with stairs or ramp for entrance   Can travel by private vehicle        Equipment Recommendations  None recommended by PT    Recommendations for Other Services       Precautions / Restrictions Precautions Precautions: Fall Recall of Precautions/Restrictions: Intact Required Braces or Orthoses: Knee Immobilizer - Left Restrictions Weight Bearing Restrictions Per Provider Order: No Other Position/Activity Restrictions: WBAT     Mobility  Bed Mobility Overal bed mobility: Needs Assistance Bed Mobility: Supine to Sit     Supine to sit: Mod assist     General bed mobility comments: assist for L LE and hand hold to lift trunk/scoot hips    Transfers Overall transfer level: Needs assistance Equipment used: Rolling walker (2 wheels) Transfers: Sit to/from Stand Sit to Stand: Mod assist           General transfer comment: lifting help, increased time, mod cues for posture, hand placement, COG over BOS    Ambulation/Gait Ambulation/Gait assistance: Min assist Gait Distance (Feet): 7 Feet Assistive device: Rolling walker (2 wheels) Gait Pattern/deviations: Step-to pattern, Decreased stride length, Trunk flexed,  Wide base of support       General Gait Details: heavy UE support and no R foot clearance due to pain with L LE weight bearing; encouragement with chair follow for in room ambulation   Stairs             Wheelchair Mobility     Tilt Bed    Modified Rankin (Stroke Patients Only)       Balance Overall balance assessment: Needs assistance Sitting-balance support: Feet supported Sitting balance-Leahy Scale: Good     Standing balance support: Bilateral upper extremity supported, Reliant on assistive device for balance Standing balance-Leahy Scale: Poor                              Communication Communication Communication: No apparent difficulties  Cognition Arousal: Alert Behavior During Therapy: WFL for tasks assessed/performed   PT - Cognitive impairments: Problem solving, No apparent impairments, Safety/Judgement                       PT - Cognition Comments: slow processing Following commands: Intact Following commands impaired: Follows multi-step commands inconsistently, Follows multi-step commands with increased time    Cueing Cueing Techniques: Verbal cues  Exercises Total Joint Exercises Ankle Circles/Pumps: AROM, Both, 10 reps, Supine Quad Sets: AROM, Both, Supine, 5 reps Short Arc Quad: AROM, Left, Supine, Other reps (comment) (6 reps) Hip ABduction/ADduction: AROM, AAROM, Left, Supine, 10 reps Straight Leg Raises: AAROM, Left, Supine, Other reps (comment) (6 reps)  General Comments General comments (skin integrity, edema, etc.): had purewick in initially stating bed had been soaked due to not having it overnight.  Encouraged BSC use while up in chair      Pertinent Vitals/Pain Pain Assessment Faces Pain Scale: Hurts whole lot Pain Location: L knee with movement Pain Descriptors / Indicators: Aching, Sore Pain Intervention(s): Monitored during session, Repositioned, Ice applied, Premedicated before session    Home  Living                          Prior Function            PT Goals (current goals can now be found in the care plan section) Progress towards PT goals: Progressing toward goals    Frequency    7X/week      PT Plan      Co-evaluation              AM-PAC PT "6 Clicks" Mobility   Outcome Measure  Help needed turning from your back to your side while in a flat bed without using bedrails?: A Little Help needed moving from lying on your back to sitting on the side of a flat bed without using bedrails?: A Lot Help needed moving to and from a bed to a chair (including a wheelchair)?: A Little Help needed standing up from a chair using your arms (e.g., wheelchair or bedside chair)?: A Lot Help needed to walk in hospital room?: Total Help needed climbing 3-5 steps with a railing? : Total 6 Click Score: 12    End of Session Equipment Utilized During Treatment: Gait belt Activity Tolerance: Patient limited by pain Patient left: with call bell/phone within reach;in chair   PT Visit Diagnosis: Difficulty in walking, not elsewhere classified (R26.2);Pain Pain - Right/Left: Left Pain - part of body: Knee     Time: 4098-1191 PT Time Calculation (min) (ACUTE ONLY): 31 min  Charges:    $Gait Training: 8-22 mins $Therapeutic Exercise: 8-22 mins PT General Charges $$ ACUTE PT VISIT: 1 Visit                     [name] Good, PT Acute Rehabilitation Services Office:[phone] 09/02/2023    [name] Good 09/02/2023, 10:57 AM

## 2023-09-02 NOTE — Plan of Care (Signed)

## 2023-09-02 NOTE — Progress Notes (Signed)
 PHARMACY - ANTICOAGULATION CONSULT NOTE  Pharmacy Consult for warfarin and enoxaparin  Indication: mesenteric thrombus  Allergies  Allergen Reactions   Amoxicillin Other (See Comments)    Tolerated Zosyn  Has patient had a PCN reaction causing immediate rash, facial/tongue/throat swelling, SOB or lightheadedness with hypotension: No Has patient had a PCN reaction causing severe rash involving mucus membranes or skin necrosis: No Has patient had a PCN reaction that required hospitalization No Has patient had a PCN reaction occurring within the last 10 years: No If all of the above answers are "NO", then may proceed with Cephalosporin use.    Patient Measurements: Height: 5\' 3"  (160 cm) Weight: 84.1 kg (185 lb 6.5 oz) IBW/kg (Calculated) : 52.4 HEPARIN  DW (KG): 71.1  Vital Signs: Temp: 98.6 F (37 C) (05/08 0334) Temp Source: Oral (05/08 0334) BP: 120/53 (05/08 0334) Pulse Rate: 95 (05/08 0334)  Labs: Recent Labs    08/31/23 0806 09/01/23 0540 09/02/23 0640  HGB  --  9.4*  --   HCT  --  29.5*  --   PLT  --  225  --   APTT 32  --   --   LABPROT 13.8 13.9 15.6*  INR 1.0 1.1 1.2  CREATININE  --  0.91  --     Estimated Creatinine Clearance: 55.7 mL/min (by C-G formula based on SCr of 0.91 mg/dL).   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 Warfarin PTA held for surgery, INR 1.0 on admission. Pharmacy to resume warfarin per surgery.   5/7: Warfarin dose 5/6 PM not given. Per RN documentation, patient refused dose. Pharmacy not notified. S/w patient this morning. She clarified it was a misunderstanding and is fine with resuming warfarin. Due to INR ~1 will resume bridging with enoxaparin  per confirmation with MD. Monitor closely for s/sx bleeding,  also resumed clopidogrel .   5/8 INR subtherapeutic at 1.2 today. No bleeding noted, no new CBC. Outpatient warfarin clinic had a plan for peri-op enoxaparin  and warfarin - see note from 08/24/23.  PTA warfarin regimen prior to peri-op plan: 2.5mg  PO all days  Goal of Therapy:  INR 2-3 Monitor platelets by anticoagulation protocol: Yes   Plan:  Repeat warfarin 4 mg PO x1 dose  Change enoxaparin  to 120 mg SQ q24h while INR subtherapeutic - she was prescribed this by outpatient warfarin clinic Check INR daily while on warfarin Continue to monitor H&H, platelets, and for s/sx bleeding  Thank you for involving pharmacy in this patient's care.  Caroline Cinnamon, PharmD, BCPS Clinical Pharmacist Clinical phone for 09/02/2023 is [phone] 09/02/2023 8:20 AM

## 2023-09-02 NOTE — Progress Notes (Signed)
 Physical Therapy Treatment Patient Details Name: [name] Good MRN: [id number] DOB: Dec 02, 1950 Today's Date: 09/02/2023   History of Present Illness Patient is a 73 y/o female admitted for L TKA.  PMH DM2, CVA, previous lumbar lamy, RLS, R THA, anxiety, arthritis, GERD, MI.    PT Comments  Patient remains slow to move and limited by pain.  She was more lethargic this pm after Dilaudid  and had purewick back in despite education this am to use [facility] then back to bed prior to second PT session.  She remains most appropriate for inpatient rehab (<3 hours/day) prior to d/c home.     If plan is discharge home, recommend the following: A lot of help with bathing/dressing/bathroom;A lot of help with walking and/or transfers;Assist for transportation;Help with stairs or ramp for entrance   Can travel by private vehicle        Equipment Recommendations  None recommended by PT    Recommendations for Other Services       Precautions / Restrictions Precautions Precautions: Fall;Knee Required Braces or Orthoses: Knee Immobilizer - Left Restrictions Weight Bearing Restrictions Per Provider Order: No Other Position/Activity Restrictions: WBAT     Mobility  Bed Mobility Overal bed mobility: Needs Assistance Bed Mobility: Sit to Supine     Supine to sit: Mod assist Sit to supine: Min assist   General bed mobility comments: assist for L LE onto bed, pt scooting back and lifting R leg    Transfers Overall transfer level: Needs assistance Equipment used: Rolling walker (2 wheels) Transfers: Sit to/from Stand Sit to Stand: Mod assist           General transfer comment: up from recliner with lifting help, increased time and cues    Ambulation/Gait Ambulation/Gait assistance: Min assist Gait Distance (Feet): 7 Feet Assistive device: Rolling walker (2 wheels) Gait Pattern/deviations: Step-to pattern, Decreased stride length, Antalgic, Trunk flexed, Wide base of support        General Gait Details: from recliner at a distance to EOB having to turn around to sit with cues and A for balance and walker proximity   Stairs             Wheelchair Mobility     Tilt Bed    Modified Rankin (Stroke Patients Only)       Balance Overall balance assessment: Needs assistance Sitting-balance support: Feet supported Sitting balance-Leahy Scale: Good Sitting balance - Comments: scooting back on EOB with UE support, no LOB   Standing balance support: Bilateral upper extremity supported, Reliant on assistive device for balance Standing balance-Leahy Scale: Poor                              Communication Communication Communication: No apparent difficulties  Cognition Arousal: Lethargic Behavior During Therapy: Flat affect   PT - Cognitive impairments: Problem solving, Safety/Judgement                       PT - Cognition Comments: slow processing Following commands: Intact Following commands impaired: Follows multi-step commands inconsistently, Follows multi-step commands with increased time    Cueing Cueing Techniques: Verbal cues  Exercises Total Joint Exercises Ankle Circles/Pumps: AROM, Both, 10 reps, Supine Quad Sets: AROM, Both, Supine, 5 reps Short Arc Quad: AROM, Left, Supine, Other reps (comment) (6 reps) Heel Slides: AROM, Left, 20 reps, Seated Hip ABduction/ADduction: AROM, AAROM, Left, Supine, 10 reps Straight Leg Raises: AAROM, Left, Supine,  Other reps (comment) (6 reps)    General Comments General comments (skin integrity, edema, etc.): Daughter present, pt in chair where she was when PT finished this am and with purewick; reported to pt and daughter education from this am for in chair 2 hours then to use [facility] with nursing help for back to bed      Pertinent Vitals/Pain Pain Assessment Faces Pain Scale: Hurts even more Pain Location: L knee with movement Pain Descriptors / Indicators: Tightness, Sore Pain  Intervention(s): Monitored during session, Repositioned    Home Living                          Prior Function            PT Goals (current goals can now be found in the care plan section) Progress towards PT goals: Progressing toward goals (slowly)    Frequency    7X/week      PT Plan      Co-evaluation              AM-PAC PT "6 Clicks" Mobility   Outcome Measure  Help needed turning from your back to your side while in a flat bed without using bedrails?: A Little Help needed moving from lying on your back to sitting on the side of a flat bed without using bedrails?: A Lot Help needed moving to and from a bed to a chair (including a wheelchair)?: A Little Help needed standing up from a chair using your arms (e.g., wheelchair or bedside chair)?: A Lot Help needed to walk in hospital room?: Total Help needed climbing 3-5 steps with a railing? : Total 6 Click Score: 12    End of Session Equipment Utilized During Treatment: Gait belt Activity Tolerance: Patient limited by pain Patient left: in bed;with call bell/phone within reach;with family/visitor present;with bed alarm set   PT Visit Diagnosis: Difficulty in walking, not elsewhere classified (R26.2);Pain Pain - Right/Left: Left Pain - part of body: Knee     Time: 1425-1450 PT Time Calculation (min) (ACUTE ONLY): 25 min  Charges:    $Gait Training: 8-22 mins $Therapeutic Activity: 8-22 mins PT General Charges $$ ACUTE PT VISIT: 1 Visit                     Abigail Hoff, PT Acute Rehabilitation Services [phone] 09/02/2023    [name] Good 09/02/2023, 3:15 PM

## 2023-09-02 NOTE — TOC Progression Note (Signed)
 Transition of Care [facility]) - Progression Note    Patient Details  Name: [name] Good MRN: [id number] Date of Birth: 04-04-1951  Transition of [facility]) CM/SW Contact  Tom-Johnson, Angelique Ken, RN Phone Number: 09/02/2023, 1:21 PM  Clinical Narrative:     CM notified by PT that patient needs SNF at discharge. LCSW to follow for disposition.   CM will follow if patient discharging home.    Expected Discharge Plan: Home w Home Health Services Barriers to Discharge: Continued Medical Work up  Expected Discharge Plan and Services   Discharge Planning Services: CM Consult Post Acute Care Choice: Durable Medical Equipment, Home Health Living arrangements for the past 2 months: Single Family Home                           HH Arranged: PT, OT HH Agency: [name] Date HH Agency Contacted: 09/01/23 Time HH Agency Contacted: 1048 Representative spoke with at [facility] Agency: Renetta Carter   Social Determinants of Health (SDOH) Interventions SDOH Screenings   Food Insecurity: No Food Insecurity (08/31/2023)  Housing: Low Risk  (08/31/2023)  Transportation Needs: No Transportation Needs (08/31/2023)  Utilities: Not At Risk (08/31/2023)  Alcohol Screen: Low Risk  (04/29/2020)  Depression (PHQ2-9): Medium Risk (08/05/2023)  Financial Resource Strain: Low Risk  (11/05/2022)  Physical Activity: Inactive (11/05/2022)  Social Connections: Moderately Isolated (08/31/2023)  Stress: No Stress Concern Present (11/05/2022)  Tobacco Use: Medium Risk (08/31/2023)  Health Literacy: Adequate Health Literacy (11/05/2022)    Readmission Risk Interventions     No data to display

## 2023-09-03 ENCOUNTER — Telehealth: Payer: Self-pay | Admitting: Family Medicine

## 2023-09-03 LAB — PROTIME-INR
INR: 1.4 — ABNORMAL HIGH (ref 0.8–1.2)
Prothrombin Time: 17.3 s — ABNORMAL HIGH (ref 11.4–15.2)

## 2023-09-03 LAB — GLUCOSE, CAPILLARY
Glucose-Capillary: 102 mg/dL — ABNORMAL HIGH (ref 70–99)
Glucose-Capillary: 85 mg/dL (ref 70–99)

## 2023-09-03 MED ORDER — WARFARIN SODIUM 3 MG PO TABS
3.0000 mg | ORAL_TABLET | Freq: Once | ORAL | Status: AC
  Administered 2023-09-03: 3 mg via ORAL
  Filled 2023-09-03: qty 1

## 2023-09-03 NOTE — Plan of Care (Signed)

## 2023-09-03 NOTE — Telephone Encounter (Signed)
 Patient husband called in regarding appointment she has on Monday 05/12 , stated patient just had hip replacement and doesn't think she is going to make it so will need to cancel for now

## 2023-09-03 NOTE — Telephone Encounter (Signed)
 Pt's husband, Josefina Nian, reports he is not sure if pt is going to SNF after d/c from hospital, so he is not sure if pt needs her coumadin  clinic apt on Monday. He reports he will talk with his wife and then return call to coumadin  clinic.

## 2023-09-03 NOTE — TOC Initial Note (Addendum)
 Transition of Care [facility]) - Initial/Assessment Note    Patient Details  Name: [name] Good MRN: [id number] Date of Birth: 10-27-1950  Transition of Care [facility]) CM/SW Contact:    Katrinka Parr, LCSW Phone Number: 09/03/2023, 2:44 PM  Clinical Narrative:                  CSW met with pt to discuss disposition. Pt is agreeable to SNF. She has been [facility] in the past and that would be her preference. CSW explained referral process and SNF auth process. Fl2 completed and bed requests sent in hub.   1430: CSW called pt's daughter and confirmed plan and preference for [facility].   [facility] has offered in hub. CSW awaiting confirmation from [facility] liaison.   Auth request submitted in online portal for [facility]. [id number]. Siegfried Dress is pending  1455: [facility] confirmed offer but cannot admit until Monday.    Expected Discharge Plan: Skilled Nursing Facility Barriers to Discharge: Insurance Authorization   Patient Goals and CMS Choice Patient states their goals for this hospitalization and ongoing recovery are:: to go home CMS Medicare.gov Compare Post Acute Care list provided to:: Patient Choice offered to / list presented to : Patient      Expected Discharge Plan and Services   Discharge Planning Services: CM Consult Post Acute Care Choice: Durable Medical Equipment, Home Health Living arrangements for the past 2 months: Single Family Home                           HH Arranged: PT, OT HH Agency: [name] Date HH Agency Contacted: 09/01/23 Time HH Agency Contacted: 1048 Representative spoke with at [facility] Agency: Renetta Carter  Prior Living Arrangements/Services Living arrangements for the past 2 months: Single Family Home                     Activities of Daily Living   ADL Screening (condition at time of admission) Independently performs ADLs?: Yes (appropriate for developmental age) Is the patient deaf or have difficulty hearing?: No Does the  patient have difficulty seeing, even when wearing glasses/contacts?: No Does the patient have difficulty concentrating, remembering, or making decisions?: No  Permission Sought/Granted                  Emotional Assessment              Admission diagnosis:  Primary osteoarthritis of left knee [M17.12] OA (osteoarthritis) of knee [M17.9] Status post total left knee replacement [Z96.652] Patient Active Problem List   Diagnosis Date Noted   OA (osteoarthritis) of knee 08/31/2023   Status post total left knee replacement 08/31/2023   Senile purpura (HCC) 08/05/2023   Supratherapeutic INR 07/05/2023   Unilateral primary osteoarthritis, right knee 06/09/2023   Unilateral primary osteoarthritis, left knee 06/09/2023   Vitamin D deficiency 02/16/2023   Dyslipidemia associated with type 2 diabetes mellitus (HCC) 01/01/2023   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   History of CVA (cerebrovascular accident) 12/07/2022   T2DM (type 2 diabetes mellitus) (HCC) 08/11/2022   Chronic pain syndrome 10/30/2021   Morbid obesity (HCC) 09/02/2021   Depression, major, single episode, moderate (HCC) 07/15/2021   Vertebral artery stenosis 07/15/2021   Cerebrovascular disease 07/15/2021   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 [place] [address] Phone: [phone] Fax: [phone]  [facility] [address] [place] - [address] [place] [address] Phone: [phone] Fax: [phone]     Social Drivers of  Health (SDOH) Social History: SDOH Screenings   Food Insecurity: No Food Insecurity (08/31/2023)  Housing: Low Risk  (08/31/2023)  Transportation Needs: No Transportation Needs (08/31/2023)  Utilities: Not At Risk (08/31/2023)  Alcohol Screen: Low Risk  (04/29/2020)  Depression (PHQ2-9): Medium Risk (08/05/2023)  Financial Resource Strain: Low Risk  (11/05/2022)  Physical Activity: Inactive (11/05/2022)  Social Connections: Moderately Isolated (08/31/2023)  Stress: No Stress Concern Present (11/05/2022)  Tobacco Use: Medium Risk (08/31/2023)  Health Literacy: Adequate Health Literacy (11/05/2022)   SDOH Interventions:     Readmission Risk Interventions     No data to display

## 2023-09-03 NOTE — NC FL2 (Signed)
   MEDICAID FL2 LEVEL OF CARE FORM     IDENTIFICATION  Patient Name: [name] Good Birthdate: February 17, 1951 Sex: female Admission Date (Current Location): 08/31/2023  [facility] and [place] Number:  Producer, television/film/video and Address:  The [place]. [facility], [address] [place], [place] [address]      Provider Number: [id number]  Attending Physician Name and Address:  Arnie Lao,*  Relative Name and Phone Number:  Chadwick, Yuen (Daughter)  [phone] [facility])    Current Level of Care: Hospital Recommended Level of Care: Skilled Nursing Facility Prior Approval Number:    Date Approved/Denied:   PASRR Number: [id number]  Discharge Plan: Home    Current Diagnoses: Patient Active Problem List   Diagnosis Date Noted   OA (osteoarthritis) of knee 08/31/2023   Status post total left knee replacement 08/31/2023   Senile purpura (HCC) 08/05/2023   Supratherapeutic INR 07/05/2023   Unilateral primary osteoarthritis, right knee 06/09/2023   Unilateral primary osteoarthritis, left knee 06/09/2023   Vitamin D deficiency 02/16/2023   Dyslipidemia associated with type 2 diabetes mellitus (HCC) 01/01/2023   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   History of CVA (cerebrovascular accident) 12/07/2022   T2DM (type 2 diabetes mellitus) (HCC) 08/11/2022   Chronic pain syndrome 10/30/2021   Morbid obesity (HCC) 09/02/2021   Depression, major, single episode, moderate (HCC) 07/15/2021   Vertebral artery stenosis 07/15/2021   Cerebrovascular disease 07/15/2021   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   RLS (restless legs syndrome) 10/02/2019   Arthritis 10/18/2017   Former smoker 11/13/2016   Autoimmune disease (HCC) 08/12/2016   Obesity (BMI 35.0-39.9 without comorbidity) 08/12/2016   Status post total replacement of right hip 10/25/2015   S/P lumbar laminectomy 06/14/2015    Orientation RESPIRATION BLADDER Height & Weight     Self, Time,  Situation, Place  Normal Continent Weight: 185 lb 6.5 oz (84.1 kg) Height:  5\' 3"  (160 cm)  BEHAVIORAL SYMPTOMS/MOOD NEUROLOGICAL BOWEL NUTRITION STATUS      Continent Diet (see d/c summary)  AMBULATORY STATUS COMMUNICATION OF NEEDS Skin   Extensive Assist Verbally Surgical wounds (incision left leg)                       Personal Care Assistance Level of Assistance  Bathing, Feeding, Dressing Bathing Assistance: Maximum assistance Feeding assistance: Independent Dressing Assistance: Maximum assistance     Functional Limitations Info  Sight, Hearing, Speech Sight Info: Adequate Hearing Info: Impaired Speech Info: Adequate    SPECIAL CARE FACTORS FREQUENCY  PT (By licensed PT), OT (By licensed OT)     PT Frequency: 5x/week OT Frequency: 5x/week            Contractures Contractures Info: Not present    Additional Factors Info  Code Status, Allergies Code Status Info: full code Allergies Info: amoxicillin           Current Medications (09/03/2023):  This is the current hospital active medication list Current Facility-Administered Medications  Medication Dose Route Frequency Provider Last Rate Last Admin   acetaminophen  (TYLENOL ) tablet 325-650 mg  325-650 mg Oral Q6H PRN Arnie Lao, MD   650 mg at 09/02/23 1438   clopidogrel  (PLAVIX ) tablet 75 mg  75 mg Oral Daily Arnie Lao, MD   75 mg at 09/02/23 0820   diphenhydrAMINE  (BENADRYL ) 12.5 MG/5ML elixir 12.5-25 mg  12.5-25 mg Oral Q4H PRN Arnie Lao, MD   25 mg at 09/02/23 2220   docusate  sodium (COLACE) capsule 100 mg  100 mg Oral BID Arnie Lao, MD   100 mg at 09/02/23 2220   enoxaparin  (LOVENOX ) injection 120 mg  120 mg Subcutaneous Q24H Kingston, Jennifer D, RPH   120 mg at 09/02/23 1156   escitalopram  (LEXAPRO ) tablet 20 mg  20 mg Oral Daily Arnie Lao, MD   20 mg at 09/02/23 0820   ezetimibe  (ZETIA ) tablet 10 mg  10 mg Oral Daily Arnie Lao, MD   10 mg at 09/02/23 0820   famotidine  (PEPCID ) tablet 20 mg  20 mg Oral Daily PRN Arnie Lao, MD       HYDROmorphone  (DILAUDID ) injection 1 mg  1 mg Intravenous Q3H PRN Arnie Lao, MD   1 mg at 09/01/23 1716   HYDROmorphone  (DILAUDID ) tablet 4 mg  4 mg Oral Q4H PRN Arnie Lao, MD   4 mg at 09/02/23 1758   menthol -cetylpyridinium (CEPACOL) lozenge 3 mg  1 lozenge Oral PRN Arnie Lao, MD       Or   phenol (CHLORASEPTIC) mouth spray 1 spray  1 spray Mouth/Throat PRN Arnie Lao, MD       oxyCODONE  (Oxy IR/ROXICODONE ) immediate release tablet 5-10 mg  5-10 mg Oral Q4H PRN Arnie Lao, MD   10 mg at 09/02/23 2220   predniSONE  (DELTASONE ) tablet 5 mg  5 mg Oral Daily Arnie Lao, MD   5 mg at 09/02/23 0820   rOPINIRole  (REQUIP ) tablet 3 mg  3 mg Oral QHS Arnie Lao, MD   3 mg at 09/02/23 2220   tiZANidine  (ZANAFLEX ) tablet 4 mg  4 mg Oral Q6H PRN Arnie Lao, MD   4 mg at 09/02/23 2220   warfarin (COUMADIN ) tablet 3 mg  3 mg Oral ONCE-1600 Ewing, Andrew [facility], [facility]       Warfarin - Pharmacist Dosing Inpatient   Does not apply q1600 Sheron Dixons [facility]   Given at 09/02/23 8657     Discharge Medications: Please see discharge summary for a list of discharge medications.  Relevant Imaging Results:  Relevant Lab Results:   Additional Information SS# [id number]  Alexy Heldt P Berdine Bread, LCSW

## 2023-09-03 NOTE — Progress Notes (Signed)
 Patient ID: [name] Good, female   DOB: 1950-11-16, 73 y.o.   MRN: [id number] The patient's vital signs are stable and her left operative knee is stable.  However her mobility has been quite limited.  She needs short-term skilled nursing level of care following this hospitalization.  The transitional care team has been consulted.  She needs a FL 2 to be signed and for case management to look into SNF upon discharge.

## 2023-09-03 NOTE — Progress Notes (Signed)
 PHARMACY - ANTICOAGULATION CONSULT NOTE  Pharmacy Consult for warfarin and enoxaparin  Indication: mesenteric thrombus  Allergies  Allergen Reactions   Amoxicillin Other (See Comments)    Tolerated Zosyn  Has patient had a PCN reaction causing immediate rash, facial/tongue/throat swelling, SOB or lightheadedness with hypotension: No Has patient had a PCN reaction causing severe rash involving mucus membranes or skin necrosis: No Has patient had a PCN reaction that required hospitalization No Has patient had a PCN reaction occurring within the last 10 years: No If all of the above answers are "NO", then may proceed with Cephalosporin use.    Patient Measurements: Height: 5\' 3"  (160 cm) Weight: 84.1 kg (185 lb 6.5 oz) IBW/kg (Calculated) : 52.4 HEPARIN  DW (KG): 71.1  Vital Signs: Temp: 98.1 F (36.7 C) (05/09 0544) Temp Source: Oral (05/09 0544) BP: 100/52 (05/09 0544) Pulse Rate: 86 (05/09 0544)  Labs: Recent Labs    08/31/23 0806 09/01/23 0540 09/02/23 0640 09/03/23 0558  HGB  --  9.4*  --   --   HCT  --  29.5*  --   --   PLT  --  225  --   --   APTT 32  --   --   --   LABPROT 13.8 13.9 15.6* 17.3*  INR 1.0 1.1 1.2 1.4*  CREATININE  --  0.91  --   --     Estimated Creatinine Clearance: 55.7 mL/min (by C-G formula based on SCr of 0.91 mg/dL).   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 Warfarin PTA held for surgery, INR 1.0 on admission. Pharmacy to resume warfarin per surgery.   5/7: Warfarin dose 5/6 PM not given. Per RN documentation, patient refused dose. Pharmacy not notified. S/w patient this morning. She clarified it was a misunderstanding and is fine with resuming warfarin. Due to INR ~1 will resume bridging with enoxaparin  per  confirmation with MD. Monitor closely for s/sx bleeding, also resumed clopidogrel .   INR subtherapeutic at 1.4 today. No bleeding noted, no new CBC. PO intake documented as 20-50%. Outpatient warfarin clinic had a plan for peri-op enoxaparin  and warfarin - see note from 08/24/23.  PTA warfarin regimen prior to peri-op plan: 2.5mg  PO all days  Goal of Therapy:  INR 2-3 Monitor platelets by anticoagulation protocol: Yes   Plan:  Warfarin 3 mg PO x1 dose  Continue enoxaparin  120 mg SQ q24h while INR subtherapeutic - she was prescribed this by outpatient warfarin clinic Check INR daily while on warfarin Continue to monitor H&H, platelets, and for s/sx bleeding  Thank you for involving pharmacy in this patient's care.  Caroline Cinnamon, PharmD, BCPS Clinical Pharmacist Clinical phone for 09/03/2023 is [phone] 09/03/2023 7:57 AM

## 2023-09-03 NOTE — Progress Notes (Signed)
 PT BID Note:  Patient with limited progress due to pain, continued oozing from her knee dressing.  She was assisted to [facility] though continues with poor memory to call for help when without purwick.  NT informed to place new one.  Feel she will need inpatient rehab (<3 hours/day) at d/c.     09/03/23 1800  PT Visit Information  Last PT Received On 09/03/23  Assistance Needed +1  History of Present Illness Patient is a 73 y/o female admitted for L TKA.  PMH DM2, CVA, previous lumbar lamy, RLS, R THA, anxiety, arthritis, GERD, MI.  Precautions  Precautions Knee;Fall  Required Braces or Orthoses Knee Immobilizer - Left  Restrictions  LLE Weight Bearing Per Provider Order WBAT  Pain Assessment  Pain Assessment Faces  Faces Pain Scale 6  Pain Location L knee with movement  Pain Descriptors / Indicators Tightness;Sore  Pain Intervention(s) Limited activity within patient's tolerance;Monitored during session;Repositioned;Ice applied  Cognition  Arousal Alert  Behavior During Therapy Flat affect  PT - Cognitive impairments Problem solving;Safety/Judgement  PT - Cognition Comments slow processing  Following Commands  Following commands Intact  Following commands impaired Follows multi-step commands inconsistently;Follows multi-step commands with increased time  Cueing  Cueing Techniques Verbal cues  Communication  Communication No apparent difficulties  Bed Mobility  Overal bed mobility Needs Assistance  Bed Mobility Sit to Supine  Sit to supine Min assist  General bed mobility comments assist for L LE and positioning in bed with cues  Transfers  Overall transfer level Needs assistance  Equipment used Rolling walker (2 wheels)  Transfers Sit to/from Stand;Bed to chair/wheelchair/BSC  Sit to Stand Mod assist  Bed to/from chair/wheelchair/BSC transfer type: Step pivot  Step pivot transfers Min assist  General transfer comment some lifting help to stand from chair; to [facility] initially as  had soiled chair  Ambulation/Gait  Ambulation/Gait assistance Min assist  Gait Distance (Feet) 4 Feet  Assistive device Rolling walker (2 wheels)  Gait Pattern/deviations Step-to pattern;Decreased stride length;Antalgic;Trunk flexed;Wide base of support  General Gait Details walked from [facility] to bed after A for hygiene, cues for sequencing and A for balance  Balance  Overall balance assessment Needs assistance  Sitting-balance support Feet supported  Sitting balance-Leahy Scale Good  Standing balance support Bilateral upper extremity supported;Reliant on assistive device for balance  Standing balance-Leahy Scale Poor  General Comments  General comments (skin integrity, edema, etc.) A for hygiene after seated in recliner and forgot no purewick and soiled herself, had not called for help though assisted after finishing her lunch in the chair.  Also reinforced knee dressing due to continued oozing bloody drainage from initial dressing.  RN aware  PT - End of Session  Equipment Utilized During Treatment Gait belt  Activity Tolerance Patient limited by pain;Other (comment)  Patient left in bed;with call bell/phone within reach;with bed alarm set   PT - Assessment/Plan  PT Visit Diagnosis Difficulty in walking, not elsewhere classified (R26.2);Pain  Pain - Right/Left Left  Pain - part of body Knee  PT Frequency (ACUTE ONLY) 7X/week  Follow Up Recommendations Follow physician's recommendations for discharge plan and follow up therapies  Patient can return home with the following A lot of help with bathing/dressing/bathroom;A lot of help with walking and/or transfers;Assist for transportation;Help with stairs or ramp for entrance  AM-PAC PT "6 Clicks" Mobility Outcome Measure (Version 2)  Help needed turning from your back to your side while in a flat bed without using bedrails? 3  Help needed moving from lying on your back to sitting on the side of a flat bed without using bedrails? 3  Help  needed moving to and from a bed to a chair (including a wheelchair)? 3  Help needed standing up from a chair using your arms (e.g., wheelchair or bedside chair)? 2  Help needed to walk in hospital room? 1  Help needed climbing 3-5 steps with a railing?  1  6 Click Score 13  Consider Recommendation of Discharge To: CIR/SNF/LTACH  PT Goal Progression  Progress towards PT goals Progressing toward goals (slowly)  PT Time Calculation  PT Start Time (ACUTE ONLY) 1213  PT Stop Time (ACUTE ONLY) 1237  PT Time Calculation (min) (ACUTE ONLY) 24 min  PT General Charges  $$ ACUTE PT VISIT 1 Visit  PT Treatments  $Therapeutic Activity 23-37 mins  Abigail Hoff, PT Acute Rehabilitation Services Office:[phone] 09/03/2023

## 2023-09-03 NOTE — Progress Notes (Signed)
 Physical Therapy Treatment Patient Details Name: [name] Good MRN: [id number] DOB: [birth year] Today's Date: 09/03/2023   History of Present Illness Patient is a 73 y/o female admitted for L TKA.  PMH DM2, CVA, previous lumbar lamy, RLS, R THA, anxiety, arthritis, GERD, MI.    PT Comments  Patient limited today due to continued pain and drainage from her knee.  RN was aware but waiting on new dressing prior to replacing.  Patient remains appropriate for inpatient rehab prior to d/c home    If plan is discharge home, recommend the following: A lot of help with bathing/dressing/bathroom;A lot of help with walking and/or transfers;Assist for transportation;Help with stairs or ramp for entrance   Can travel by private vehicle        Equipment Recommendations       Recommendations for Other Services       Precautions / Restrictions Precautions Precautions: Knee;Fall Required Braces or Orthoses: Knee Immobilizer - Left Restrictions LLE Weight Bearing Per Provider Order: Weight bearing as tolerated     Mobility  Bed Mobility Overal bed mobility: Needs Assistance Bed Mobility: Supine to Sit     Supine to sit: HOB elevated, Used rails, Min assist     General bed mobility comments: assist for L LE, increased time and cues for scooting to EOB    Transfers Overall transfer level: Needs assistance Equipment used: Rolling walker (2 wheels) Transfers: Sit to/from Stand, Bed to chair/wheelchair/BSC Sit to Stand: Mod assist   Step pivot transfers: Min assist       General transfer comment: increased time, up to stand from EOB with lifting help; stepping to recliner only due to pain and bloody drainage from knee (RN aware)    Ambulation/Gait                   Stairs             Wheelchair Mobility     Tilt Bed    Modified Rankin (Stroke Patients Only)       Balance Overall balance assessment: Needs assistance   Sitting balance-Leahy Scale: Good      Standing balance support: Bilateral upper extremity supported, Reliant on assistive device for balance Standing balance-Leahy Scale: Poor Standing balance comment: A and UE support for static balance                            Communication Communication Communication: No apparent difficulties  Cognition Arousal: Lethargic Behavior During Therapy: Flat affect   PT - Cognitive impairments: Problem solving, Safety/Judgement                       PT - Cognition Comments: slow processing Following commands: Intact Following commands impaired: Follows multi-step commands inconsistently, Follows multi-step commands with increased time    Cueing Cueing Techniques: Verbal cues  Exercises Total Joint Exercises Ankle Circles/Pumps: AROM, Both, 10 reps, Supine Heel Slides: AROM, Left, Seated, 10 reps    General Comments        Pertinent Vitals/Pain Pain Assessment Pain Assessment: Faces Faces Pain Scale: Hurts whole lot Pain Location: L knee with movement Pain Descriptors / Indicators: Tightness, Sore Pain Intervention(s): Monitored during session, Repositioned, Ice applied    Home Living                          Prior Function  PT Goals (current goals can now be found in the care plan section) Progress towards PT goals: Not progressing toward goals - comment    Frequency    7X/week      PT Plan      Co-evaluation              AM-PAC PT "6 Clicks" Mobility   Outcome Measure  Help needed turning from your back to your side while in a flat bed without using bedrails?: A Little Help needed moving from lying on your back to sitting on the side of a flat bed without using bedrails?: A Little Help needed moving to and from a bed to a chair (including a wheelchair)?: A Little Help needed standing up from a chair using your arms (e.g., wheelchair or bedside chair)?: A Lot Help needed to walk in hospital room?: Total Help  needed climbing 3-5 steps with a railing? : Total 6 Click Score: 13    End of Session Equipment Utilized During Treatment: Gait belt Activity Tolerance: Patient limited by pain;Other (comment) (limited due to drainage from wound) Patient left: in chair;with call bell/phone within reach   PT Visit Diagnosis: Difficulty in walking, not elsewhere classified (R26.2);Pain Pain - Right/Left: Left Pain - part of body: Knee     Time: 6045-4098 PT Time Calculation (min) (ACUTE ONLY): 17 min  Charges:    $Therapeutic Activity: 8-22 mins PT General Charges $$ ACUTE PT VISIT: 1 Visit                     Abigail Hoff, PT Acute Rehabilitation Services Office:[phone] 09/03/2023    [name] 09/03/2023, 5:58 PM

## 2023-09-03 NOTE — Progress Notes (Signed)
 Patient ID: [name] Good, female   DOB: [birth year], 73 y.o.   MRN: [id number] The patient is awake and alert.  Her vital signs are stable.  She has a large amount of bloody drainage from her left knee due to being on a Lovenox  bridge as well as Coumadin .  At this point it is safer to stop the Lovenox  given the fact that she is draining too much from her left knee due to the blood thinning medication.  I am fine with her being just on Coumadin  out of the standpoint even if it is subtherapeutic based on what I am seeing with her knee.  We are waiting notification on short-term skilled nursing facility placement.  An FL 2 has been signed.

## 2023-09-04 LAB — GLUCOSE, CAPILLARY
Glucose-Capillary: 106 mg/dL — ABNORMAL HIGH (ref 70–99)
Glucose-Capillary: 149 mg/dL — ABNORMAL HIGH (ref 70–99)

## 2023-09-04 LAB — PROTIME-INR
INR: 1.6 — ABNORMAL HIGH (ref 0.8–1.2)
Prothrombin Time: 18.8 s — ABNORMAL HIGH (ref 11.4–15.2)

## 2023-09-04 MED ORDER — WARFARIN SODIUM 2.5 MG PO TABS
3.7500 mg | ORAL_TABLET | Freq: Once | ORAL | Status: AC
  Administered 2023-09-04: 3.75 mg via ORAL
  Filled 2023-09-04: qty 1

## 2023-09-04 MED ORDER — OXYCODONE HCL 5 MG PO TABS
10.0000 mg | ORAL_TABLET | ORAL | 0 refills | Status: DC | PRN
Start: 2023-09-04 — End: 2023-10-01

## 2023-09-04 MED ORDER — BACLOFEN 10 MG PO TABS: 10.0000 mg | ORAL_TABLET | Freq: Three times a day (TID) | ORAL | 5 refills | Status: DC | PRN

## 2023-09-04 NOTE — Progress Notes (Signed)
 PHARMACY - ANTICOAGULATION CONSULT NOTE  Pharmacy Consult for warfarin Indication: mesenteric thrombus  Allergies  Allergen Reactions   Amoxicillin Other (See Comments)    Tolerated Zosyn  Has patient had a PCN reaction causing immediate rash, facial/tongue/throat swelling, SOB or lightheadedness with hypotension: No Has patient had a PCN reaction causing severe rash involving mucus membranes or skin necrosis: No Has patient had a PCN reaction that required hospitalization No Has patient had a PCN reaction occurring within the last 10 years: No If all of the above answers are "NO", then may proceed with Cephalosporin use.    Patient Measurements: Height: 5\' 3"  (160 cm) Weight: 84.1 kg (185 lb 6.5 oz) IBW/kg (Calculated) : 52.4 HEPARIN  DW (KG): 71.1  Vital Signs: Temp: 98.4 F (36.9 C) (05/10 0354) Temp Source: Oral (05/10 0354) BP: 122/58 (05/10 0354) Pulse Rate: 88 (05/10 0354)  Labs: Recent Labs    09/02/23 0640 09/03/23 0558 09/04/23 0519  LABPROT 15.6* 17.3* 18.8*  INR 1.2 1.4* 1.6*    Estimated Creatinine Clearance: 55.7 mL/min (by C-G formula based on SCr of 0.91 mg/dL).   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 Warfarin PTA held for surgery, INR 1.0 on admission. Pharmacy to resume warfarin per surgery.   5/7: Warfarin dose 5/6 PM not given. Per RN documentation, patient refused dose. Pharmacy not notified. S/w patient this morning. She clarified it was a misunderstanding and is fine with resuming warfarin. Due to INR ~1 will resume bridging with enoxaparin  per confirmation with MD. Monitor closely for s/sx bleeding, also resumed clopidogrel .   INR subtherapeutic at 1.4 today. No bleeding noted, no new CBC. PO intake documented as  20-50%. Outpatient warfarin clinic had a plan for peri-op enoxaparin  and warfarin - see note from 08/24/23.  PTA warfarin regimen prior to peri-op plan: 2.5mg  PO all days post-op [facility] plan from her anticoag clinic is 3.75 mg daily on 5/7 through 5/10  5/10 AM update: Lovenox  discontinued 5/9 2ndary to post-op bleeding. Continue coumadin  as ordered per ortho surgery INR 1.6 Patient intake was reported as 100% on 5/9 (up from 20-50% on previous days)   Goal of Therapy:  INR 2-3 Monitor platelets by anticoagulation protocol: Yes   Plan:  Warfarin 3.75 mg PO x1 dose  Check INR daily while on warfarin Continue to monitor H&H, platelets, and for s/sx bleeding  Thank you for involving pharmacy in this patient's care.  Marleta Simmer BS, PharmD, BCPS Clinical Pharmacist 09/04/2023 8:06 AM  Contact: [phone] after 3 PM  "Be curious, not judgmental..." -Rumalda Counter

## 2023-09-04 NOTE — Progress Notes (Signed)
 Patient ID: [name] Good, female   DOB: [birth year], 73 y.o.   MRN: [id number] The patient is awake and alert.  Her vital signs are stable.  The dressing that I placed on her left knee yesterday is clean and dry.  She is now awaiting short-term skilled nursing facility placement.  The transitional care team has been consulted for this.  Once there is insurance authorization and a bed is available at a SNF, then she can be discharged.

## 2023-09-04 NOTE — Progress Notes (Signed)
 Physical Therapy Treatment Patient Details Name: [name] Good MRN: [id number] DOB: July 06, 1950 Today's Date: 09/04/2023   History of Present Illness Patient is a 73 y/o female admitted for L TKA.  PMH DM2, CVA, previous lumbar lamy, RLS, R THA, anxiety, arthritis, GERD, MI.    PT Comments  Pt resting in bed on arrival and agreeable to session with encouragement as pt reporting increased fatigue this date. Pt making slow but steady progress towards acute goals this session able to progress forward gait ~7' with RW for support and min A to maintain balance. Pt continues to require up to mod A to boost to stand with cues for hand placement and increased time to rise. Pt able to perform seated LLE exercises for increased ROM and strength with hands on assist for increased ROM. Issued HEP handout with pt verbalizing understanding of compliance. Pt up in chair at end of session with all needs met. Patient will benefit from continued inpatient follow up therapy, <3 hours/day, will continue to follow acutely.    If plan is discharge home, recommend the following: A lot of help with bathing/dressing/bathroom;A lot of help with walking and/or transfers;Assist for transportation;Help with stairs or ramp for entrance   Can travel by private vehicle        Equipment Recommendations  None recommended by PT    Recommendations for Other Services       Precautions / Restrictions Precautions Precautions: Knee;Fall Recall of Precautions/Restrictions: Intact Required Braces or Orthoses: Knee Immobilizer - Left Restrictions Weight Bearing Restrictions Per Provider Order: Yes LLE Weight Bearing Per Provider Order: Weight bearing as tolerated     Mobility  Bed Mobility Overal bed mobility: Needs Assistance Bed Mobility: Supine to Sit     Supine to sit: HOB elevated, Used rails, Min assist     General bed mobility comments: assist for L LE    Transfers Overall transfer level: Needs  assistance Equipment used: Rolling walker (2 wheels) Transfers: Sit to/from Stand, Bed to chair/wheelchair/BSC Sit to Stand: Mod assist           General transfer comment: some lifting help to stand from EOB x3;    Ambulation/Gait Ambulation/Gait assistance: Min assist Gait Distance (Feet): 7 Feet Assistive device: Rolling walker (2 wheels) Gait Pattern/deviations: Step-to pattern, Decreased stride length, Antalgic, Trunk flexed, Wide base of support Gait velocity: decr     General Gait Details: short gait from EOB with chair follow for safety   Stairs             Wheelchair Mobility     Tilt Bed    Modified Rankin (Stroke Patients Only)       Balance Overall balance assessment: Needs assistance Sitting-balance support: Feet supported Sitting balance-Leahy Scale: Good Sitting balance - Comments: scooting back on EOB with UE support, no LOB   Standing balance support: Bilateral upper extremity supported, Reliant on assistive device for balance Standing balance-Leahy Scale: Poor Standing balance comment: A and UE support for static balance                            Communication Communication Communication: No apparent difficulties  Cognition Arousal: Alert Behavior During Therapy: Flat affect   PT - Cognitive impairments: Problem solving, Safety/Judgement                       PT - Cognition Comments: slow processing Following commands: Intact Following commands impaired:  Follows multi-step commands inconsistently, Follows multi-step commands with increased time    Cueing Cueing Techniques: Verbal cues  Exercises Total Joint Exercises Ankle Circles/Pumps: AROM, Both, 10 reps, Seated Quad Sets: AROM, Both, 5 reps, Seated Heel Slides: AAROM, Left, 5 reps Hip ABduction/ADduction: AAROM, Left, 5 reps Long Arc Quad: AROM, Left, 5 reps Knee Flexion: AAROM, Left, 5 reps    General Comments        Pertinent Vitals/Pain Pain  Assessment Pain Assessment: Faces Faces Pain Scale: Hurts even more Pain Location: L knee with movement Pain Descriptors / Indicators: Tightness, Sore Pain Intervention(s): Monitored during session, Limited activity within patient's tolerance    Home Living                          Prior Function            PT Goals (current goals can now be found in the care plan section) Acute Rehab PT Goals Patient Stated Goal: return home PT Goal Formulation: With patient/family Time For Goal Achievement: 09/07/23 Progress towards PT goals: Progressing toward goals    Frequency    7X/week      PT Plan      Co-evaluation              AM-PAC PT "6 Clicks" Mobility   Outcome Measure  Help needed turning from your back to your side while in a flat bed without using bedrails?: A Little Help needed moving from lying on your back to sitting on the side of a flat bed without using bedrails?: A Little Help needed moving to and from a bed to a chair (including a wheelchair)?: A Little Help needed standing up from a chair using your arms (e.g., wheelchair or bedside chair)?: A Lot Help needed to walk in hospital room?: Total Help needed climbing 3-5 steps with a railing? : Total 6 Click Score: 13    End of Session Equipment Utilized During Treatment: Gait belt Activity Tolerance: Patient limited by pain;Other (comment) Patient left: with call bell/phone within reach;in chair;with chair alarm set Nurse Communication: Mobility status PT Visit Diagnosis: Difficulty in walking, not elsewhere classified (R26.2);Pain Pain - Right/Left: Left Pain - part of body: Knee     Time: 1610-9604 PT Time Calculation (min) (ACUTE ONLY): 31 min  Charges:    $Gait Training: 8-22 mins $Therapeutic Exercise: 8-22 mins PT General Charges $$ ACUTE PT VISIT: 1 Visit                     Asani Deniston R. PTA Acute Rehabilitation Services Office: [phone]   Agapito Horseman 09/04/2023, 3:16  PM

## 2023-09-05 LAB — PROTIME-INR
INR: 2 — ABNORMAL HIGH (ref 0.8–1.2)
Prothrombin Time: 22.7 s — ABNORMAL HIGH (ref 11.4–15.2)

## 2023-09-05 MED ORDER — WARFARIN SODIUM 2.5 MG PO TABS
2.5000 mg | ORAL_TABLET | Freq: Once | ORAL | Status: AC
  Administered 2023-09-05: 2.5 mg via ORAL
  Filled 2023-09-05: qty 1

## 2023-09-05 MED ORDER — WARFARIN SODIUM 3 MG PO TABS: 3.5000 mg | ORAL_TABLET | Freq: Once | ORAL | Status: DC

## 2023-09-05 NOTE — Progress Notes (Signed)
 Patient ID: [name] Good, female   DOB: 11-20-1950, 73 y.o.   MRN: [id number] Patient is comfortable this morning.  She is making slow progress with physical therapy and anticipate discharge to skilled nursing.

## 2023-09-05 NOTE — Progress Notes (Addendum)
 PHARMACY - ANTICOAGULATION CONSULT NOTE  Pharmacy Consult for warfarin Indication: mesenteric thrombus  Allergies  Allergen Reactions   Amoxicillin Other (See Comments)    Tolerated Zosyn  Has patient had a PCN reaction causing immediate rash, facial/tongue/throat swelling, SOB or lightheadedness with hypotension: No Has patient had a PCN reaction causing severe rash involving mucus membranes or skin necrosis: No Has patient had a PCN reaction that required hospitalization No Has patient had a PCN reaction occurring within the last 10 years: No If all of the above answers are "NO", then may proceed with Cephalosporin use.    Patient Measurements: Height: 5\' 3"  (160 cm) Weight: 84.1 kg (185 lb 6.5 oz) IBW/kg (Calculated) : 52.4 HEPARIN  DW (KG): 71.1  Vital Signs: Temp: 98 F (36.7 C) (05/11 0438) Temp Source: Oral (05/11 0438) BP: 126/56 (05/11 0438) Pulse Rate: 80 (05/11 0438)  Labs: Recent Labs    09/03/23 0558 09/04/23 0519 09/05/23 0634  LABPROT 17.3* 18.8* 22.7*  INR 1.4* 1.6* 2.0*    Estimated Creatinine Clearance: 55.7 mL/min (by C-G formula based on SCr of 0.91 mg/dL).   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 Warfarin PTA held for surgery, INR 1.0 on admission. Pharmacy to resume warfarin per surgery.   5/7: Warfarin dose 5/6 PM not given. Per RN documentation, patient refused dose. Pharmacy not notified. S/w patient this morning. She clarified it was a misunderstanding and is fine with resuming warfarin. Due to INR ~1 will resume bridging with enoxaparin  per confirmation with MD. Monitor closely for s/sx bleeding, also resumed clopidogrel .   INR subtherapeutic at 1.4 today. No bleeding noted, no new CBC. PO intake documented as  20-50%. Outpatient warfarin clinic had a plan for peri-op enoxaparin  and warfarin - see note from 08/24/23.  PTA warfarin regimen prior to peri-op plan: 2.5mg  PO all days post-op [facility] plan from her anticoag clinic is 3.75 mg daily on 5/7 through 5/10  5/11 AM update: INR 2.0 Patient intake was reported as 100% on 5/9 and 5/10 (up from 20-50% on previous days)   Goal of Therapy:  INR 2-3 Monitor platelets by anticoagulation protocol: Yes   Plan:  Warfarin 2.5 mg PO x1 dose  Check INR daily while on warfarin Continue to monitor H&H, platelets, and for s/sx bleeding  Thank you for involving pharmacy in this patient's care.  Marleta Simmer BS, PharmD, BCPS Clinical Pharmacist 09/05/2023 7:28 AM  Contact: [phone] after 3 PM  "Be curious, not judgmental..." -Rumalda Counter

## 2023-09-05 NOTE — Plan of Care (Signed)
  Problem: Clinical Measurements: Goal: Will remain free from infection Outcome: Not Progressing   Problem: Clinical Measurements: Goal: Cardiovascular complication will be avoided Outcome: Not Progressing   Problem: Activity: Goal: Risk for activity intolerance will decrease Outcome: Not Progressing   Problem: Pain Managment: Goal: General experience of comfort will improve and/or be controlled Outcome: Not Progressing   Problem: Skin Integrity: Goal: Risk for impaired skin integrity will decrease Outcome: Not Progressing   Problem: Safety: Goal: Ability to remain free from injury will improve Outcome: Not Progressing

## 2023-09-06 ENCOUNTER — Ambulatory Visit

## 2023-09-06 DIAGNOSIS — Z7401 Bed confinement status: Secondary | ICD-10-CM | POA: Diagnosis not present

## 2023-09-06 DIAGNOSIS — M25562 Pain in left knee: Secondary | ICD-10-CM | POA: Diagnosis not present

## 2023-09-06 DIAGNOSIS — K5900 Constipation, unspecified: Secondary | ICD-10-CM | POA: Diagnosis not present

## 2023-09-06 DIAGNOSIS — M62838 Other muscle spasm: Secondary | ICD-10-CM | POA: Diagnosis not present

## 2023-09-06 DIAGNOSIS — K219 Gastro-esophageal reflux disease without esophagitis: Secondary | ICD-10-CM | POA: Diagnosis not present

## 2023-09-06 DIAGNOSIS — Z96652 Presence of left artificial knee joint: Secondary | ICD-10-CM | POA: Diagnosis not present

## 2023-09-06 DIAGNOSIS — R112 Nausea with vomiting, unspecified: Secondary | ICD-10-CM | POA: Diagnosis not present

## 2023-09-06 DIAGNOSIS — I2510 Atherosclerotic heart disease of native coronary artery without angina pectoris: Secondary | ICD-10-CM | POA: Diagnosis not present

## 2023-09-06 DIAGNOSIS — M1990 Unspecified osteoarthritis, unspecified site: Secondary | ICD-10-CM | POA: Diagnosis not present

## 2023-09-06 DIAGNOSIS — M1712 Unilateral primary osteoarthritis, left knee: Secondary | ICD-10-CM | POA: Diagnosis not present

## 2023-09-06 DIAGNOSIS — E46 Unspecified protein-calorie malnutrition: Secondary | ICD-10-CM | POA: Diagnosis not present

## 2023-09-06 DIAGNOSIS — D692 Other nonthrombocytopenic purpura: Secondary | ICD-10-CM | POA: Diagnosis not present

## 2023-09-06 DIAGNOSIS — G2581 Restless legs syndrome: Secondary | ICD-10-CM | POA: Diagnosis not present

## 2023-09-06 DIAGNOSIS — R2681 Unsteadiness on feet: Secondary | ICD-10-CM | POA: Diagnosis not present

## 2023-09-06 DIAGNOSIS — D649 Anemia, unspecified: Secondary | ICD-10-CM | POA: Diagnosis not present

## 2023-09-06 DIAGNOSIS — Z8673 Personal history of transient ischemic attack (TIA), and cerebral infarction without residual deficits: Secondary | ICD-10-CM | POA: Diagnosis not present

## 2023-09-06 DIAGNOSIS — E785 Hyperlipidemia, unspecified: Secondary | ICD-10-CM | POA: Diagnosis not present

## 2023-09-06 DIAGNOSIS — T7840XD Allergy, unspecified, subsequent encounter: Secondary | ICD-10-CM | POA: Diagnosis not present

## 2023-09-06 DIAGNOSIS — I739 Peripheral vascular disease, unspecified: Secondary | ICD-10-CM | POA: Diagnosis not present

## 2023-09-06 DIAGNOSIS — Z7901 Long term (current) use of anticoagulants: Secondary | ICD-10-CM | POA: Diagnosis not present

## 2023-09-06 DIAGNOSIS — Z471 Aftercare following joint replacement surgery: Secondary | ICD-10-CM | POA: Diagnosis not present

## 2023-09-06 DIAGNOSIS — I959 Hypotension, unspecified: Secondary | ICD-10-CM | POA: Diagnosis not present

## 2023-09-06 LAB — GLUCOSE, CAPILLARY: Glucose-Capillary: 105 mg/dL — ABNORMAL HIGH (ref 70–99)

## 2023-09-06 LAB — PROTIME-INR
INR: 2.1 — ABNORMAL HIGH (ref 0.8–1.2)
Prothrombin Time: 23.6 s — ABNORMAL HIGH (ref 11.4–15.2)

## 2023-09-06 MED ORDER — WARFARIN SODIUM 2.5 MG PO TABS
2.5000 mg | ORAL_TABLET | Freq: Every day | ORAL | Status: DC
  Filled 2023-09-06: qty 1

## 2023-09-06 NOTE — Progress Notes (Signed)
 PHARMACY - ANTICOAGULATION CONSULT NOTE  Pharmacy Consult for warfarin Indication: mesenteric thrombus  Allergies  Allergen Reactions   Amoxicillin Other (See Comments)    Tolerated Zosyn  Has patient had a PCN reaction causing immediate rash, facial/tongue/throat swelling, SOB or lightheadedness with hypotension: No Has patient had a PCN reaction causing severe rash involving mucus membranes or skin necrosis: No Has patient had a PCN reaction that required hospitalization No Has patient had a PCN reaction occurring within the last 10 years: No If all of the above answers are "NO", then may proceed with Cephalosporin use.    Patient Measurements: Height: 5\' 3"  (160 cm) Weight: 84.1 kg (185 lb 6.5 oz) IBW/kg (Calculated) : 52.4 HEPARIN  DW (KG): 71.1  Vital Signs: Temp: 98.4 F (36.9 C) (05/12 0830) Temp Source: Oral (05/12 0830) BP: 114/40 (05/12 0830) Pulse Rate: 88 (05/12 0830)  Labs: Recent Labs    09/04/23 0519 09/05/23 0634 09/06/23 0607  LABPROT 18.8* 22.7* 23.6*  INR 1.6* 2.0* 2.1*    Estimated Creatinine Clearance: 55.7 mL/min (by C-G formula based on SCr of 0.91 mg/dL).   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 Warfarin PTA held for surgery, INR 1.0 on admission. Pharmacy to resume warfarin per surgery.   PTA warfarin regimen: 2.5mg  PO all days  5/7: Warfarin dose 5/6 PM not given. Per RN documentation, patient refused dose. Pharmacy not notified. S/w patient this morning. She clarified it was a misunderstanding and is fine with resuming warfarin. Due to INR ~1 will resume bridging with enoxaparin  per confirmation with MD. Monitor closely for s/sx bleeding, also resumed clopidogrel .    INR 2.1 is therapeutic. Planning discharge to  SNF.  Goal of Therapy:  INR 2-3 Monitor platelets by anticoagulation protocol: Yes   Plan:  Warfarin 2.5mg  daily  F/u INR at least weekly at SNF    Thank you for involving pharmacy in this patient's care.  Dorene Gang, PharmD, BCPS, BCCP Clinical Pharmacist  Please check AMION for all [facility] Pharmacy phone numbers After 10:00 PM, call Main Pharmacy [phone]

## 2023-09-06 NOTE — Plan of Care (Signed)
  Problem: Education: Goal: Knowledge of General Education information will improve Description: Including pain rating scale, medication(s)/side effects and non-pharmacologic comfort measures Outcome: Adequate for Discharge   Problem: Pain Managment: Goal: General experience of comfort will improve and/or be controlled Outcome: Adequate for Discharge

## 2023-09-06 NOTE — Progress Notes (Signed)
 Patient ID: [name] Good, female   DOB: 08/28/1950, 73 y.o.   MRN: [id number] Today is postoperative day #6 status post a left total knee replacement.  The patient is still in the hospital awaiting skilled nursing placement.  If a bed is found today, she can be discharged today.  Her knee is stable and her vital signs are stable.

## 2023-09-06 NOTE — Progress Notes (Signed)
 Report given to staff nurse at [facility], all questions and concerns were fully answered.

## 2023-09-06 NOTE — Telephone Encounter (Signed)
 Pt's daughter, Athanasia Kriete, called to report pt is still in hospital and will be transferred to SNF as soon as possible for rehab. Advised they will manage warfarin while she is in SNF. Advised to contact coumadin  clinic when pt is d/c from SNF. Advised to give pt well wishes from coumadin  clinic. Jayra verbalized understanding.

## 2023-09-06 NOTE — TOC Transition Note (Addendum)
 Transition of Care [facility]) - Discharge Note   Patient Details  Name: [name] Good MRN: [id number] Date of Birth: 20-Jun-1950  Transition of Care [facility]) CM/SW Contact:  Ernst Heap Phone Number: [phone] 09/06/2023, 12:52 PM   Clinical Narrative:   Patient will be going to [facility] today. Family notified.   CSW notified patients bedside RN via secure chat patients room #211 and the report call number is: [phone].  CSW will contact PTAR once patient is notified that she is ready.   2:00 PM- CSW called PTAR. Patients dc summary paperwork left on the chart.   TOC will continue following.     Final next level of care: Skilled Nursing Facility Barriers to Discharge: Insurance Authorization   Patient Goals and CMS Choice Patient states their goals for this hospitalization and ongoing recovery are:: to go home CMS Medicare.gov Compare Post Acute Care list provided to:: Patient Choice offered to / list presented to : Patient      Discharge Placement                       Discharge Plan and Services Additional resources added to the After Visit Summary for     Discharge Planning Services: CM Consult Post Acute Care Choice: Durable Medical Equipment, Home Health                    HH Arranged: PT, OT [facility] Agency: [name] Date [facility] Agency Contacted: 09/01/23 Time HH Agency Contacted: 1048 Representative spoke with at [facility] Agency: Renetta Carter  Social Drivers of Health (SDOH) Interventions SDOH Screenings   Food Insecurity: No Food Insecurity (08/31/2023)  Housing: Low Risk  (08/31/2023)  Transportation Needs: No Transportation Needs (08/31/2023)  Utilities: Not At Risk (08/31/2023)  Alcohol Screen: Low Risk  (04/29/2020)  Depression (PHQ2-9): Medium Risk (08/05/2023)  Financial Resource Strain: Low Risk  (11/05/2022)  Physical Activity: Inactive (11/05/2022)  Social Connections: Moderately Isolated (08/31/2023)  Stress: No Stress Concern  Present (11/05/2022)  Tobacco Use: Medium Risk (08/31/2023)  Health Literacy: Adequate Health Literacy (11/05/2022)     Readmission Risk Interventions     No data to display

## 2023-09-06 NOTE — TOC Progression Note (Addendum)
 Transition of Care [facility]) - Progression Note    Patient Details  Name: [name] Good MRN: [id number] Date of Birth: 11/09/1950  Transition of Care [facility]) CM/SW Contact  Ernst Heap Phone Number: [phone] 09/06/2023, 9:39 AM  Clinical Narrative:   Patients insurance [employer] #[id number] was approved for 09/04/2023-09/07/2023.   9:38 AM- HF CSW informed liaison, Sherrlyn Dolores at [employer] that insurance [employer] was approved. Waiting for facility information updates.   9:42 AM- CSW called and updated patients daughter about insurance auth and Lyna Sandhoff transporting patient to facility. Family is agreeable.    TOC will continue following.       Expected Discharge Plan: Skilled Nursing Facility Barriers to Discharge: Insurance Authorization  Expected Discharge Plan and Services   Discharge Planning Services: CM Consult Post Acute Care Choice: Durable Medical Equipment, Home Health Living arrangements for the past 2 months: Single Family Home Expected Discharge Date: 09/06/23                         HH Arranged: PT, OT HH Agency: [name] Date HH Agency Contacted: 09/01/23 Time HH Agency Contacted: 1048 Representative spoke with at [facility] Agency: Renetta Carter   Social Determinants of Health (SDOH) Interventions SDOH Screenings   Food Insecurity: No Food Insecurity (08/31/2023)  Housing: Low Risk  (08/31/2023)  Transportation Needs: No Transportation Needs (08/31/2023)  Utilities: Not At Risk (08/31/2023)  Alcohol Screen: Low Risk  (04/29/2020)  Depression (PHQ2-9): Medium Risk (08/05/2023)  Financial Resource Strain: Low Risk  (11/05/2022)  Physical Activity: Inactive (11/05/2022)  Social Connections: Moderately Isolated (08/31/2023)  Stress: No Stress Concern Present (11/05/2022)  Tobacco Use: Medium Risk (08/31/2023)  Health Literacy: Adequate Health Literacy (11/05/2022)    Readmission Risk Interventions     No data to display

## 2023-09-06 NOTE — Care Management Important Message (Signed)
 Important Message  Patient Details  Name: [name] Good MRN: [id number] Date of Birth: 11-03-1950   Important Message Given:  Yes - Medicare IM     Felix Host 09/06/2023, 2:12 PM

## 2023-09-06 NOTE — Progress Notes (Signed)
 Discharge summary packet (AVS) & necessary documents provided to PTAR. Pt d/c to [facility] at [facility] as ordered, She remains alert/oriented in no apparent distress.

## 2023-09-06 NOTE — Discharge Summary (Signed)
 Patient ID: [name] Good MRN: [id number] DOB/AGE: 27-Mar-1951 73 y.o.  Admit date: 08/31/2023 Discharge date: 09/06/2023  Admission Diagnoses:  Principal Problem:   Unilateral primary osteoarthritis, left knee Active Problems:   OA (osteoarthritis) of knee   Status post total left knee replacement   Discharge Diagnoses:  Same  Past Medical History:  Diagnosis Date   Anxiety    Arthritis    Depression    GERD (gastroesophageal reflux disease)    Myocardial infarction (HCC) 73   no visits to cardiac dr(thomas kelly) since 89   Stroke (HCC)    Wound dehiscence    lumbar    Surgeries: Procedure(s): ARTHROPLASTY, KNEE, TOTAL on 08/31/2023   Consultants:   Discharged Condition: Improved  Hospital Course: ANTOINNETTE HOLLOMON is an 73 y.o. female who was admitted 08/31/2023 for operative treatment ofUnilateral primary osteoarthritis, left knee. Patient has severe unremitting pain that affects sleep, daily activities, and work/hobbies. After pre-op clearance the patient was taken to the operating room on 08/31/2023 and underwent  Procedure(s): ARTHROPLASTY, KNEE, TOTAL.    Patient was given perioperative antibiotics:  Anti-infectives (From admission, onward)    Start     Dose/Rate Route Frequency Ordered Stop   08/31/23 0815  ceFAZolin  (ANCEF ) IVPB 2g/100 mL premix        2 g 200 mL/hr over 30 Minutes Intravenous On call to O.R. 08/31/23 0802 08/31/23 1114        Patient was given sequential compression devices, early ambulation, and chemoprophylaxis to prevent DVT.  Patient benefited maximally from hospital stay and there were no complications.    Recent vital signs: Patient Vitals for the past 24 hrs:  BP Temp Temp src Pulse Resp SpO2  09/06/23 0442 (!) 126/59 98.3 F (36.8 C) Oral 88 16 98 %  09/05/23 2018 (!) 139/53 99 F (37.2 C) Oral (!) 101 -- 98 %  09/05/23 1600 124/60 99 F (37.2 C) Oral 93 19 98 %  09/05/23 0743 (!) 137/58 99 F (37.2 C) Oral 87 19 99 %      Recent laboratory studies:  Recent Labs    09/04/23 0519 09/05/23 0634  INR 1.6* 2.0*     Discharge Medications:   Allergies as of 09/06/2023       Reactions   Amoxicillin Other (See Comments)   Tolerated Zosyn  Has patient had a PCN reaction causing immediate rash, facial/tongue/throat swelling, SOB or lightheadedness with hypotension: No Has patient had a PCN reaction causing severe rash involving mucus membranes or skin necrosis: No Has patient had a PCN reaction that required hospitalization No Has patient had a PCN reaction occurring within the last 10 years: No If all of the above answers are "NO", then may proceed with Cephalosporin use.        Medication List     STOP taking these medications    enoxaparin  120 MG/0.8ML injection Commonly known as: LOVENOX    oxyCODONE -acetaminophen  10-325 MG tablet Commonly known as: PERCOCET       TAKE these medications    atorvastatin  80 MG tablet Commonly known as: LIPITOR  Take 1 tablet (80 mg total) by mouth daily.   baclofen  10 MG tablet Commonly known as: LIORESAL  Take 1 tablet (10 mg total) by mouth 3 (three) times daily as needed for muscle spasms. What changed:  when to take this reasons to take this   calcium  carbonate 750 MG chewable tablet Commonly known as: TUMS EX Chew 1 tablet by mouth daily as needed for heartburn.  chlorhexidine  4 % external liquid Commonly known as: HIBICLENS  Apply 15 mLs (1 Application total) topically as directed for 30 doses. Use as directed daily for 5 days every other week for 6 weeks.   clopidogrel  75 MG tablet Commonly known as: PLAVIX  Take 1 tablet (75 mg total) by mouth daily.   diphenhydrAMINE  25 mg capsule Commonly known as: BENADRYL  Take 25 mg by mouth at bedtime.   docusate sodium  100 MG capsule Commonly known as: COLACE Take 100 mg by mouth daily as needed for mild constipation or moderate constipation.   escitalopram  20 MG tablet Commonly known as:  LEXAPRO  TAKE 1 TABLET BY MOUTH DAILY   ezetimibe  10 MG tablet Commonly known as: ZETIA  Take 1 tablet (10 mg total) by mouth daily.   famotidine  20 MG tablet Commonly known as: PEPCID  Take 20 mg by mouth daily as needed for heartburn or indigestion.   hydrocortisone  2.5 % cream Apply topically 2 (two) times daily. What changed:  how much to take when to take this   methotrexate 2.5 MG tablet Commonly known as: RHEUMATREX Take 2.5 mg by mouth once a week. Caution:Chemotherapy. Protect from light.   mupirocin  ointment 2 % Commonly known as: BACTROBAN  Place 1 Application into the nose 2 (two) times daily for 60 doses. Use as directed 2 times daily for 5 days every other week for 6 weeks.   ondansetron  4 MG tablet Commonly known as: Zofran  Take 1 tablet (4 mg total) by mouth every 8 (eight) hours as needed for nausea or vomiting. What changed: when to take this   oxyCODONE  5 MG immediate release tablet Commonly known as: Oxy IR/ROXICODONE  Take 2-3 tablets (10-15 mg total) by mouth every 4 (four) hours as needed for moderate pain (pain score 4-6) (pain score 4-6).   phentermine  37.5 MG tablet Commonly known as: ADIPEX-P  TAKE 1 TABLET BY MOUTH EVERY MORNING   predniSONE  5 MG tablet Commonly known as: DELTASONE  TAKE 1 TABLET BY MOUTH DAILY   QC TUMERIC COMPLEX PO Take 1 capsule by mouth daily.   rOPINIRole  3 MG tablet Commonly known as: REQUIP  TAKE 1 TABLET BY MOUTH AT BEDTIME AS NEEDED What changed: when to take this   triamcinolone  ointment 0.5 % Commonly known as: KENALOG  Apply 1 Application topically 2 (two) times daily.   warfarin 2.5 MG tablet Commonly known as: COUMADIN  Take as directed. If you are unsure how to take this medication, talk to your nurse or doctor. Original instructions: TAKE 1 TABLET BY MOUTH DAILY OR AS DIRECTED BY ANTICOAGULATION CLINIC               Durable Medical Equipment  (From admission, onward)           Start      Ordered   08/31/23 1411  DME 3 n 1  Once        08/31/23 1410   08/31/23 1411  DME Walker rolling  Once       Question Answer Comment  Walker: With 5 Inch Wheels   Patient needs a walker to treat with the following condition Status post total left knee replacement      08/31/23 1410            Diagnostic Studies: DG Knee Left Port Result Date: 08/31/2023 CLINICAL DATA:  Status post total left knee arthroplasty. EXAM: PORTABLE LEFT KNEE - 1-2 VIEW COMPARISON:  Left knee radiographs 06/09/2023 FINDINGS: Interval total left knee arthroplasty. No perihardware lucency is seen to indicate hardware  failure or loosening. Expected postoperative changes including intra-articular and subcutaneous air. Mild-to-moderatejoint effusion. Anterior surgical skin staples. No acute fracture or dislocation. IMPRESSION: Interval total left knee arthroplasty without evidence of hardware failure. Electronically Signed   By: Bertina Broccoli M.D.   On: 08/31/2023 16:27    Disposition: Discharge disposition: 03-Skilled Nursing Facility          Follow-up Information     Arnie Lao, MD Follow up in 2 week(s).   Specialty: Orthopedic Surgery Contact information: [address] [place] [address] [phone]         [facility]  Follow up.   Why: for home health services Contact information: [address] [place] [address] [phone]                  Signed: Arnie Lao 09/06/2023, 7:37 AM

## 2023-09-06 NOTE — Progress Notes (Signed)
 Physical Therapy Treatment Patient Details Name: [name] Good MRN: [id number] DOB: Nov 07, 1950 Today's Date: 09/06/2023   History of Present Illness Patient is a 73 y/o female admitted for L TKA.  PMH DM2, CVA, previous lumbar lamy, RLS, R THA, anxiety, arthritis, GERD, MI.    PT Comments  Pt received in supine and eager for participation. Pt reports completing exercises independently, however has not been flexing L knee. Pt demonstrates significant pain and difficulty flexing L knee during exercises requiring assist, however is able to complete SLR this session. Pt requires mod A to stand from EOB and min A to take a few lateral steps towards the [facility]. Pt is unable to tolerate progressing gait this session due to L knee pain. Pt continues to benefit from PT services to progress toward functional mobility goals.     If plan is discharge home, recommend the following: A lot of help with bathing/dressing/bathroom;A lot of help with walking and/or transfers;Assist for transportation;Help with stairs or ramp for entrance   Can travel by private vehicle        Equipment Recommendations  None recommended by PT    Recommendations for Other Services       Precautions / Restrictions Precautions Precautions: Knee;Fall Recall of Precautions/Restrictions: Intact Restrictions Weight Bearing Restrictions Per Provider Order: Yes LLE Weight Bearing Per Provider Order: Weight bearing as tolerated     Mobility  Bed Mobility Overal bed mobility: Needs Assistance Bed Mobility: Supine to Sit, Sit to Supine     Supine to sit: Contact guard, Used rails, HOB elevated Sit to supine: Min assist   General bed mobility comments: increased time. Min A for supine scoot towards HOB    Transfers Overall transfer level: Needs assistance Equipment used: Rolling walker (2 wheels) Transfers: Sit to/from Stand Sit to Stand: Mod assist           General transfer comment: Mod A to stand from EOB with  cues for safe hand placement. Pt able to lateral step towards [facility] with min A for RW management and dense cues for sequencing and L knee extension in stance. Heavy reliance on BUE for support    Ambulation/Gait             Pre-gait activities: lateral steps at EOB General Gait Details: unable due to pain and instability       Balance Overall balance assessment: Needs assistance Sitting-balance support: Feet supported Sitting balance-Leahy Scale: Good Sitting balance - Comments: sitting EOB   Standing balance support: Bilateral upper extremity supported, Reliant on assistive device for balance, During functional activity Standing balance-Leahy Scale: Poor Standing balance comment: reliant on RW support                            Communication Communication Communication: No apparent difficulties  Cognition Arousal: Alert Behavior During Therapy: WFL for tasks assessed/performed                           PT - Cognition Comments: slow processing, pt reports previous stroke that affects her processing at times Following commands: Intact Following commands impaired: Follows multi-step commands with increased time    Cueing Cueing Techniques: Verbal cues  Exercises Total Joint Exercises Quad Sets: AROM, 5 reps, Supine, Left Heel Slides: AAROM, Left, 5 reps, Supine Straight Leg Raises: AROM, Supine, Left, 5 reps Long Arc Quad: AROM, Left, 5 reps, Seated Knee Flexion: Left, 5  reps, AROM, Seated    General Comments        Pertinent Vitals/Pain Pain Assessment Pain Assessment: Faces Faces Pain Scale: Hurts whole lot Pain Location: L knee with mobility Pain Descriptors / Indicators: Tightness, Sore Pain Intervention(s): Monitored during session, Limited activity within patient's tolerance     PT Goals (current goals can now be found in the care plan section) Acute Rehab PT Goals Patient Stated Goal: return home PT Goal Formulation: With  patient/family Time For Goal Achievement: 09/07/23 Progress towards PT goals: Progressing toward goals    Frequency    7X/week       AM-PAC PT "6 Clicks" Mobility   Outcome Measure  Help needed turning from your back to your side while in a flat bed without using bedrails?: A Little Help needed moving from lying on your back to sitting on the side of a flat bed without using bedrails?: A Little Help needed moving to and from a bed to a chair (including a wheelchair)?: A Lot Help needed standing up from a chair using your arms (e.g., wheelchair or bedside chair)?: A Lot Help needed to walk in hospital room?: Total Help needed climbing 3-5 steps with a railing? : Total 6 Click Score: 12    End of Session Equipment Utilized During Treatment: Gait belt Activity Tolerance: Patient limited by pain Patient left: with call bell/phone within reach;in bed;with bed alarm set Nurse Communication: Mobility status PT Visit Diagnosis: Difficulty in walking, not elsewhere classified (R26.2);Pain     Time: 6962-9528 PT Time Calculation (min) (ACUTE ONLY): 32 min  Charges:    $Therapeutic Exercise: 8-22 mins $Therapeutic Activity: 8-22 mins PT General Charges $$ ACUTE PT VISIT: 1 Visit                     Michaelle Adolphus, PTA Acute Rehabilitation Services Secure Chat Preferred  [phone]    Michaelle Adolphus 09/06/2023, 3:49 PM

## 2023-09-07 ENCOUNTER — Ambulatory Visit

## 2023-09-12 DIAGNOSIS — M1990 Unspecified osteoarthritis, unspecified site: Secondary | ICD-10-CM | POA: Diagnosis not present

## 2023-09-12 DIAGNOSIS — M1712 Unilateral primary osteoarthritis, left knee: Secondary | ICD-10-CM | POA: Diagnosis not present

## 2023-09-12 DIAGNOSIS — I2510 Atherosclerotic heart disease of native coronary artery without angina pectoris: Secondary | ICD-10-CM | POA: Diagnosis not present

## 2023-09-12 DIAGNOSIS — E46 Unspecified protein-calorie malnutrition: Secondary | ICD-10-CM | POA: Diagnosis not present

## 2023-09-13 ENCOUNTER — Ambulatory Visit: Payer: Medicare Other | Admitting: Orthopaedic Surgery

## 2023-09-13 ENCOUNTER — Encounter: Payer: Self-pay | Admitting: Orthopaedic Surgery

## 2023-09-13 DIAGNOSIS — Z96652 Presence of left artificial knee joint: Secondary | ICD-10-CM

## 2023-09-13 NOTE — Progress Notes (Signed)
 The patient is here for first postoperative visit status post a left total knee replacement.  She is recovering in a skilled nursing facility.  She is in her knee immobilizer today.  She is uncertain about when it is being removed.  On examination of her left knee today her incision looks good.  Staples were removed and Steri-Strips applied.  Calf is soft.  She is on chronic blood thinning medication.  Fortunately her extension is full active flex her to about 70 to 80 degrees.  I gave notes to give the facility the need to work on aggressive range of motion and to stop the knee immobilizer.  She needs to be discharged from the skilled nursing home hopefully soon to be set up with likely outpatient physical therapy for aggressive motion of her knee.  From our standpoint, we will see her back in 4 weeks to see how she is doing overall but no x-rays are needed.

## 2023-09-16 ENCOUNTER — Telehealth: Payer: Self-pay | Admitting: Family Medicine

## 2023-09-16 ENCOUNTER — Ambulatory Visit: Payer: Self-pay | Admitting: *Deleted

## 2023-09-16 ENCOUNTER — Ambulatory Visit: Payer: Self-pay

## 2023-09-16 MED ORDER — ROPINIROLE HCL 4 MG PO TABS: 4.0000 mg | ORAL_TABLET | Freq: Every day | ORAL | 5 refills | Status: DC

## 2023-09-16 NOTE — Addendum Note (Signed)
 Addended by: Janazia Schreier M on: 09/16/2023 03:36 PM   Modules accepted: Orders

## 2023-09-16 NOTE — Telephone Encounter (Signed)
 This RN called the pt. Pt did not answer and the voicemail box was full so RN was unable to leave a message.  Next, RN called [facility] where pt is currently residing and spoke to a staff member there. Staff member stated that the order they have for Requip  3 mg is as needed. Per chart review, there is a note for Requip  that states "pt taking differently: take by mouth at bedtime." It appears pt is supposed to take this medication nightly rather than only PRN. Staff member at [employer] the pt has not been receiving this medication every night.   RN advised [facility] that RN would seek confirmation from the provider that they may change that order to Requip  3mg  nightly. RN also advised [facility] to have the pt give us  a call to RN can triage the pt for her worsening RLS.  Please call [facility] at [phone] with order change.   Copied from CRM [id number]. Topic: Clinical - Medication Question >> Sep 16, 2023 11:31 AM Magdalene School wrote: Reason for CRM: Patient called to inquire whether she can take a higher dose of Ropinirole  (Requip ) 3 mg for her restless legs syndrome. She is currently staying at [facility] following a knee replacement surgery and reports that her symptoms have worsened postoperatively, making it difficult for her to sleep.  She stated that if Dr. Daneil Dunker approves an increased dosage, the nursing center will need to be notified.  [facility] Phone: [phone] Patient Contact Number: [phone]

## 2023-09-16 NOTE — Telephone Encounter (Signed)
 Please see Triage message 09/16/23

## 2023-09-16 NOTE — Telephone Encounter (Signed)
 Reason for Disposition  [1] Caller has URGENT medicine question about med that PCP or specialist prescribed AND [2] triager unable to answer question  Answer Assessment - Initial Assessment Questions 1. NAME of MEDICINE: "What medicine(s) are you calling about?"     I'm needing a medication change.    Requip  3 mg.   I'm taking now. I'm in a nursing home.   I had knee replacement surgery.   Dr. Daneil Dunker knows about it.   I'm having a lot of issues with my restless legs syndrome.     2. QUESTION: "What is your question?" (e.g., double dose of medicine, side effect)     I'm needing something for the worsening RLS.    Yesterday my legs were so restless.   I was up all night last night with my legs being so restless.   We have a doctor here.  I found out it's ordered here as "as needed".   I didn't know they were not giving it to me regularly.    I'm really needing something for this or my medication back to where I'm getting it daily. 3. PRESCRIBER: "Who prescribed the medicine?" Reason: if prescribed by specialist, call should be referred to that group.     Dr. Daneil Dunker   4. SYMPTOMS: "Do you have any symptoms?" If Yes, ask: "What symptoms are you having?"  "How bad are the symptoms (e.g., mild, moderate, severe)     Worsening RLS.   Come to find out she is not getting her Requip  daily at the nursing home.  5. PREGNANCY:  "Is there any chance that you are pregnant?" "When was your last menstrual period?"     N/A  Protocols used: Medication Question Call-A-AH

## 2023-09-16 NOTE — Telephone Encounter (Signed)
**Note De-identified  [place] Obfuscation** Please advise 

## 2023-09-16 NOTE — Telephone Encounter (Signed)
  Chief Complaint: Worsening restless legs syndrome after having knee replacement surgery.   She's in a nursing home and they have it ordered "as needed" instead of daily.   She is needing something for the RLS or the medication changed back to daily. (They have a doctor there at the nursing home). Symptoms: Legs jerking and restless real bad.   All last night she was awake and crying due to her legs being so restless. Frequency: nightly but really bad last night.   Pertinent Negatives: Patient denies being able to sleep. Disposition: [] ED /[] Urgent Care (no appt availability in office) / [] Appointment(In office/virtual)/ []  [facility]/ [] Home Care/ [] Refused Recommended Disposition /[] [facility]/ [x]  Follow-up with PCP Additional Notes: Message sent to Dr. Daneil Dunker.

## 2023-09-23 ENCOUNTER — Ambulatory Visit: Payer: Self-pay | Admitting: Family Medicine

## 2023-09-23 NOTE — Telephone Encounter (Signed)
 Chief Complaint: Head spinning chronic issue, not worse than normal  Symptoms: "Almost black out," dizziness, double vision at times Frequency: Intermittent Pertinent Negatives: Patient denies numbness, weakness, loss of speech  Disposition: [x] Appointment (In office)  Additional Notes: Patient states she is in the nursing home as she recently had a knee replacement at the beginning of month. Patient states she is having therapy before she can home. Patient is supposed to go home on Saturday. Patient has told staff at facility about her dizziness. Patients states Dr. Daneil Dunker is aware of her dizziness. Patient scheduled for an appointment on Monday, 7/2, in office. This RN educated pt on new-worsening symptoms and when to call back/seek emergent care. Pt verbalized understanding and agrees to plan.   Copied from CRM [id number]. Topic: Clinical - Red Word Triage >> Sep 23, 2023 11:01 AM [employer] wrote: Red Word that prompted transfer to Nurse Triage: head spinning, getting nausea, worried about stroke. Reason for Disposition . [1] MODERATE dizziness (e.g., vertigo; feels very unsteady, interferes with normal activities) AND [2] has been evaluated by doctor (or NP/PA) for this  Answer Assessment - Initial Assessment Questions Chief Complaint: Head spinning chronic issue, not worse than normal   Symptoms: "Almost black out," dizziness, double vision at times  Frequency: Intermittent  Pertinent Negatives: Patient denies numbness, weakness, loss of speech  Protocols used: Dizziness - Vertigo-A-AH

## 2023-09-23 NOTE — Telephone Encounter (Signed)
 Noted Patient has an OV with PCP on 09/27/2023

## 2023-09-26 DIAGNOSIS — K219 Gastro-esophageal reflux disease without esophagitis: Secondary | ICD-10-CM | POA: Diagnosis not present

## 2023-09-26 DIAGNOSIS — Z7901 Long term (current) use of anticoagulants: Secondary | ICD-10-CM | POA: Diagnosis not present

## 2023-09-26 DIAGNOSIS — G894 Chronic pain syndrome: Secondary | ICD-10-CM | POA: Diagnosis not present

## 2023-09-26 DIAGNOSIS — Z604 Social exclusion and rejection: Secondary | ICD-10-CM | POA: Diagnosis not present

## 2023-09-26 DIAGNOSIS — E785 Hyperlipidemia, unspecified: Secondary | ICD-10-CM | POA: Diagnosis not present

## 2023-09-26 DIAGNOSIS — E1169 Type 2 diabetes mellitus with other specified complication: Secondary | ICD-10-CM | POA: Diagnosis not present

## 2023-09-26 DIAGNOSIS — D649 Anemia, unspecified: Secondary | ICD-10-CM | POA: Diagnosis not present

## 2023-09-26 DIAGNOSIS — I252 Old myocardial infarction: Secondary | ICD-10-CM | POA: Diagnosis not present

## 2023-09-26 DIAGNOSIS — Z96652 Presence of left artificial knee joint: Secondary | ICD-10-CM | POA: Diagnosis not present

## 2023-09-26 DIAGNOSIS — E559 Vitamin D deficiency, unspecified: Secondary | ICD-10-CM | POA: Diagnosis not present

## 2023-09-26 DIAGNOSIS — Z471 Aftercare following joint replacement surgery: Secondary | ICD-10-CM | POA: Diagnosis not present

## 2023-09-26 DIAGNOSIS — Z7952 Long term (current) use of systemic steroids: Secondary | ICD-10-CM | POA: Diagnosis not present

## 2023-09-26 DIAGNOSIS — E46 Unspecified protein-calorie malnutrition: Secondary | ICD-10-CM | POA: Diagnosis not present

## 2023-09-26 DIAGNOSIS — Z8673 Personal history of transient ischemic attack (TIA), and cerebral infarction without residual deficits: Secondary | ICD-10-CM | POA: Diagnosis not present

## 2023-09-26 DIAGNOSIS — E1151 Type 2 diabetes mellitus with diabetic peripheral angiopathy without gangrene: Secondary | ICD-10-CM | POA: Diagnosis not present

## 2023-09-26 DIAGNOSIS — G2581 Restless legs syndrome: Secondary | ICD-10-CM | POA: Diagnosis not present

## 2023-09-26 DIAGNOSIS — M1711 Unilateral primary osteoarthritis, right knee: Secondary | ICD-10-CM | POA: Diagnosis not present

## 2023-09-26 DIAGNOSIS — Z7902 Long term (current) use of antithrombotics/antiplatelets: Secondary | ICD-10-CM | POA: Diagnosis not present

## 2023-09-26 DIAGNOSIS — G4700 Insomnia, unspecified: Secondary | ICD-10-CM | POA: Diagnosis not present

## 2023-09-26 DIAGNOSIS — Z87891 Personal history of nicotine dependence: Secondary | ICD-10-CM | POA: Diagnosis not present

## 2023-09-26 DIAGNOSIS — Z96641 Presence of right artificial hip joint: Secondary | ICD-10-CM | POA: Diagnosis not present

## 2023-09-27 ENCOUNTER — Telehealth: Payer: Self-pay

## 2023-09-27 ENCOUNTER — Ambulatory Visit: Admitting: Family Medicine

## 2023-09-27 ENCOUNTER — Telehealth: Payer: Self-pay | Admitting: Orthopaedic Surgery

## 2023-09-27 NOTE — Telephone Encounter (Signed)
 Pt's daughter, Sherissa, reports pt was d/c from SNF on 5/31 and will need INR check. She reports pt was advised to take 2.5 mg daily at d/c. Unsure of what pt was taking in facility.  Scheduled pt for tomorrow at [facility] per request.

## 2023-09-27 NOTE — Transitions of Care (Post Inpatient/ED Visit) (Signed)
 09/27/2023  Name: [name] Good MRN: [id number] DOB: 1950-06-05  Today's TOC FU Call Status: Today's TOC FU Call Status:: Successful TOC FU Call Completed TOC FU Call Complete Date: 09/27/23 Patient's Name and Date of Birth confirmed.  Transition Care Management Follow-up Telephone Call Date of Discharge: 09/25/23 Discharge Facility: Other ([facility]) Name of Other ([facility]) Discharge Facility: [facility] Type of Discharge: Inpatient Admission Primary Inpatient Discharge Diagnosis:: anxiety How have you been since you were released from the hospital?: Better Any questions or concerns?: No  Items Reviewed: Did you receive and understand the discharge instructions provided?: Yes Medications obtained,verified, and reconciled?: Yes (Medications Reviewed) Any new allergies since your discharge?: No Dietary orders reviewed?: Yes Do you have support at home?: Yes People in Home [RPT]: spouse  Medications Reviewed Today: Medications Reviewed Today     Reviewed by Darrall Ellison, LPN (Licensed Practical Nurse) on 09/27/23 at 1619  Med List Status: <None>   Medication Order Taking? Sig Documenting Provider Last Dose Status Informant  atorvastatin  (LIPITOR ) 80 MG tablet [id number] No Take 1 tablet (80 mg total) by mouth daily. Rodney Clamp, MD 08/31/2023 Morning Active Self, Pharmacy Records           Med Note (CRUTHIS, CHLOE C   Tue Aug 24, 2023 11:34 AM) Pt is adamant she is still taking this medication daily. Dispense report does not support this claim.   baclofen  (LIORESAL ) 10 MG tablet [id number]  Take 1 tablet (10 mg total) by mouth 3 (three) times daily as needed for muscle spasms. Arnie Lao, MD  Active   calcium  carbonate (TUMS EX) 750 MG chewable tablet [id number] No Chew 1 tablet by mouth daily as needed for heartburn. [provider] More than a month Active Self, Pharmacy Records           Med Note (CRUTHIS, CHLOE C   Tue Aug 24, 2023 11:30 AM)     chlorhexidine  (HIBICLENS ) 4 % external liquid [id number]  Apply 15 mLs (1 Application total) topically as directed for 30 doses. Use as directed daily for 5 days every other week for 6 weeks. Arnie Lao, MD  Active   clopidogrel  (PLAVIX ) 75 MG tablet [id number] No Take 1 tablet (75 mg total) by mouth daily. Rodney Clamp, MD 08/27/2023 Active Self, Pharmacy Records  diphenhydrAMINE  (BENADRYL ) 25 mg capsule [id number] No Take 25 mg by mouth at bedtime. [provider] Past Week Active Self, Pharmacy Records  docusate sodium  (COLACE) 100 MG capsule [id number] No Take 100 mg by mouth daily as needed for mild constipation or moderate constipation. [provider] Past Week Active Self, Pharmacy Records  escitalopram  (LEXAPRO ) 20 MG tablet [id number] No TAKE 1 TABLET BY MOUTH DAILY Rodney Clamp, MD Past Week Active Self, Pharmacy Records  ezetimibe  (ZETIA ) 10 MG tablet [id number] No Take 1 tablet (10 mg total) by mouth daily. Rodney Clamp, MD Past Week Active Self, Pharmacy Records  famotidine  (PEPCID ) 20 MG tablet [id number] No Take 20 mg by mouth daily as needed for heartburn or indigestion. [provider] Past Month Active Self, Pharmacy Records  hydrocortisone  2.5 % cream [id number] No Apply topically 2 (two) times daily.  Patient taking differently: Apply 1 Application topically 3 (three) times daily.   Rodney Clamp, MD Past Week Active Self, Pharmacy Records  methotrexate [facility]) 2.5 MG tablet [id number] No Take 2.5 mg by mouth once a week. Caution:Chemotherapy. Protect from light.  Patient not taking: Reported on 08/24/2023  [provider] Not Taking Active Self, Pharmacy Records  mupirocin  ointment (BACTROBAN ) 2 % [id number]  Place 1 Application into the nose 2 (two) times daily for 60 doses. Use as directed 2 times daily for 5 days every other week for 6 weeks. Arnie Lao, MD  Active   ondansetron  (ZOFRAN ) 4 MG tablet [id number]  No Take 1 tablet (4 mg total) by mouth every 8 (eight) hours as needed for nausea or vomiting.  Patient taking differently: Take 4 mg by mouth daily as needed for nausea or vomiting.   Rodney Clamp, MD Unknown Active Self, Pharmacy Records           Med Note (CRUTHIS, CHLOE C   Tue Aug 24, 2023 11:31 AM)    oxyCODONE  (OXY IR/ROXICODONE ) 5 MG immediate release tablet [id number]  Take 2-3 tablets (10-15 mg total) by mouth every 4 (four) hours as needed for moderate pain (pain score 4-6) (pain score 4-6). Arnie Lao, MD  Active   phentermine  (ADIPEX-P ) 37.5 MG tablet [id number] No TAKE 1 TABLET BY MOUTH EVERY MORNING Rodney Clamp, MD 08/27/2023 Active Self, Pharmacy Records  predniSONE  (DELTASONE ) 5 MG tablet [id number] No TAKE 1 TABLET BY MOUTH DAILY Rodney Clamp, MD 08/31/2023  7:00 AM Active Self, Pharmacy Records  rOPINIRole  (REQUIP ) 3 MG tablet [id number] No TAKE 1 TABLET BY MOUTH AT BEDTIME AS NEEDED  Patient taking differently: Take 3 mg by mouth at bedtime.   Rodney Clamp, MD 08/31/2023  7:00 AM Active Self, Pharmacy Records  rOPINIRole  (REQUIP ) 4 MG tablet [id number]  Take 1 tablet (4 mg total) by mouth at bedtime. Rodney Clamp, MD  Active   triamcinolone  ointment (KENALOG ) 0.5 % [id number] No Apply 1 Application topically 2 (two) times daily.  Patient not taking: Reported on 08/24/2023   Rodney Clamp, MD Not Taking Active Self, Pharmacy Records  Turmeric (QC [facility] COMPLEX PO) [id number] No Take 1 capsule by mouth daily. [provider] Past Month Active Self, Pharmacy Records  warfarin (COUMADIN ) 2.5 MG tablet [id number] No TAKE 1 TABLET BY MOUTH DAILY OR AS DIRECTED BY ANTICOAGULATION CLINIC Rodney Clamp, MD 08/27/2023 Active Self, Pharmacy Records            Home Care and Equipment/Supplies: Were Home Health Services Ordered?: Yes Name of Home Health Agency:: [name] Has Agency set up a time to come to your home?: Yes First Home Health Visit Date:  09/26/23 Any new equipment or medical supplies ordered?: NA  Functional Questionnaire: Do you need assistance with bathing/showering or dressing?: Yes Do you need assistance with meal preparation?: Yes Do you need assistance with eating?: No Do you have difficulty maintaining continence: No Do you need assistance with getting out of bed/getting out of a chair/moving?: Yes Do you have difficulty managing or taking your medications?: No  Follow up appointments reviewed: PCP Follow-up appointment confirmed?: Yes Date of PCP follow-up appointment?: 10/01/23 Follow-up Provider: [facility] Follow-up appointment confirmed?: NA Do you need transportation to your follow-up appointment?: No Do you understand care options if your condition(s) worsen?: Yes-patient verbalized understanding    SIGNATURE Darrall Ellison, LPN [facility] Nurse Health Advisor Direct Dial [phone]

## 2023-09-27 NOTE — Telephone Encounter (Signed)
 Pt's daughter, Zunairah, was also not aware pt was scheduled for an apt with PCP today for dizziness. She said she told her mother to cancel that apt. She reports pt called from the SNF and the triage nurse made that apt. Cancelled apt with PCP for today. Advised she has a hospital f/u apt on 6/6 and another apt in July for 3 month f/u with PCP. Callee verbalized understanding.

## 2023-09-27 NOTE — Telephone Encounter (Signed)
 Verbal ok given.

## 2023-09-27 NOTE — Telephone Encounter (Signed)
 [name] Good with [facility] request verbal order  3 times a week for 2 weeks  for PT    Brian's call back number [phone]

## 2023-09-28 ENCOUNTER — Telehealth: Payer: Self-pay | Admitting: Orthopaedic Surgery

## 2023-09-28 ENCOUNTER — Ambulatory Visit (INDEPENDENT_AMBULATORY_CARE_PROVIDER_SITE_OTHER)

## 2023-09-28 DIAGNOSIS — Z7901 Long term (current) use of anticoagulants: Secondary | ICD-10-CM

## 2023-09-28 LAB — POCT INR: INR: 3.8 — AB (ref 2.0–3.0)

## 2023-09-28 NOTE — Telephone Encounter (Signed)
 Patient called. Says she is home and would like outpatient physical therapy.  Her cb# [phone]

## 2023-09-28 NOTE — Progress Notes (Signed)
 Pt had knee surgery and was d/c to SNF. She is now back home. Pt's daughter reports the SNF was giving pt all the medication on her med list and she had stopped taking many of those medications before having surgery. She is unsure of warfarin dosing but was d/c with 5 mg and 2.5 mg tablets. Pt is unsure of INR results while in the SNF also. Results not in chart. One of the medications that was given to pt while in SNF was methotrexate which has a major interaction with warfarin. Pt reports she wasn't sure what dose warfarin she should take last night so she did not take any. Advised to assure PCP updates med list at her hospital f/u on 6/6.  Hold dose today and then continue 1 tablets (2.5 mg) daily. Recheck in 1 weeks.

## 2023-09-28 NOTE — Patient Instructions (Addendum)
 Pre visit review using our clinic review tool, if applicable. No additional management support is needed unless otherwise documented below in the visit note.  Hold dose today and then continue 1 tablets (2.5 mg) daily. Recheck in 1 weeks.

## 2023-09-29 DIAGNOSIS — E785 Hyperlipidemia, unspecified: Secondary | ICD-10-CM | POA: Diagnosis not present

## 2023-09-29 DIAGNOSIS — Z96652 Presence of left artificial knee joint: Secondary | ICD-10-CM | POA: Diagnosis not present

## 2023-09-29 DIAGNOSIS — M1711 Unilateral primary osteoarthritis, right knee: Secondary | ICD-10-CM | POA: Diagnosis not present

## 2023-09-29 DIAGNOSIS — E46 Unspecified protein-calorie malnutrition: Secondary | ICD-10-CM | POA: Diagnosis not present

## 2023-09-29 DIAGNOSIS — Z604 Social exclusion and rejection: Secondary | ICD-10-CM | POA: Diagnosis not present

## 2023-09-29 DIAGNOSIS — Z87891 Personal history of nicotine dependence: Secondary | ICD-10-CM | POA: Diagnosis not present

## 2023-09-29 DIAGNOSIS — Z7901 Long term (current) use of anticoagulants: Secondary | ICD-10-CM | POA: Diagnosis not present

## 2023-09-29 DIAGNOSIS — I252 Old myocardial infarction: Secondary | ICD-10-CM | POA: Diagnosis not present

## 2023-09-29 DIAGNOSIS — Z96641 Presence of right artificial hip joint: Secondary | ICD-10-CM | POA: Diagnosis not present

## 2023-09-29 DIAGNOSIS — E559 Vitamin D deficiency, unspecified: Secondary | ICD-10-CM | POA: Diagnosis not present

## 2023-09-29 DIAGNOSIS — Z7902 Long term (current) use of antithrombotics/antiplatelets: Secondary | ICD-10-CM | POA: Diagnosis not present

## 2023-09-29 DIAGNOSIS — G4700 Insomnia, unspecified: Secondary | ICD-10-CM | POA: Diagnosis not present

## 2023-09-29 DIAGNOSIS — D649 Anemia, unspecified: Secondary | ICD-10-CM | POA: Diagnosis not present

## 2023-09-29 DIAGNOSIS — Z471 Aftercare following joint replacement surgery: Secondary | ICD-10-CM | POA: Diagnosis not present

## 2023-09-29 DIAGNOSIS — Z7952 Long term (current) use of systemic steroids: Secondary | ICD-10-CM | POA: Diagnosis not present

## 2023-09-29 DIAGNOSIS — G894 Chronic pain syndrome: Secondary | ICD-10-CM | POA: Diagnosis not present

## 2023-09-29 DIAGNOSIS — E1151 Type 2 diabetes mellitus with diabetic peripheral angiopathy without gangrene: Secondary | ICD-10-CM | POA: Diagnosis not present

## 2023-09-29 DIAGNOSIS — G2581 Restless legs syndrome: Secondary | ICD-10-CM | POA: Diagnosis not present

## 2023-09-29 DIAGNOSIS — K219 Gastro-esophageal reflux disease without esophagitis: Secondary | ICD-10-CM | POA: Diagnosis not present

## 2023-09-29 DIAGNOSIS — E1169 Type 2 diabetes mellitus with other specified complication: Secondary | ICD-10-CM | POA: Diagnosis not present

## 2023-09-29 DIAGNOSIS — Z8673 Personal history of transient ischemic attack (TIA), and cerebral infarction without residual deficits: Secondary | ICD-10-CM | POA: Diagnosis not present

## 2023-10-01 ENCOUNTER — Emergency Department (HOSPITAL_COMMUNITY)

## 2023-10-01 ENCOUNTER — Inpatient Hospital Stay (HOSPITAL_COMMUNITY)
Admission: EM | Admit: 2023-10-01 | Discharge: 2023-10-06 | DRG: 378 | Disposition: A | Discharge status: Home / Self Care | Attending: Internal Medicine | Admitting: Internal Medicine

## 2023-10-01 ENCOUNTER — Telehealth: Payer: Self-pay

## 2023-10-01 ENCOUNTER — Encounter (HOSPITAL_COMMUNITY): Payer: Self-pay

## 2023-10-01 ENCOUNTER — Encounter: Payer: Self-pay | Admitting: Family Medicine

## 2023-10-01 ENCOUNTER — Ambulatory Visit (INDEPENDENT_AMBULATORY_CARE_PROVIDER_SITE_OTHER): Admitting: Family Medicine

## 2023-10-01 ENCOUNTER — Other Ambulatory Visit: Payer: Self-pay

## 2023-10-01 VITALS — BP 131/78 | HR 96 | Temp 97.3°F | Ht 63.0 in

## 2023-10-01 DIAGNOSIS — Z6832 Body mass index (BMI) 32.0-32.9, adult: Secondary | ICD-10-CM

## 2023-10-01 DIAGNOSIS — K922 Gastrointestinal hemorrhage, unspecified: Secondary | ICD-10-CM | POA: Diagnosis not present

## 2023-10-01 DIAGNOSIS — D509 Iron deficiency anemia, unspecified: Secondary | ICD-10-CM

## 2023-10-01 DIAGNOSIS — K2990 Gastroduodenitis, unspecified, without bleeding: Secondary | ICD-10-CM | POA: Diagnosis present

## 2023-10-01 DIAGNOSIS — D649 Anemia, unspecified: Secondary | ICD-10-CM | POA: Diagnosis present

## 2023-10-01 DIAGNOSIS — K219 Gastro-esophageal reflux disease without esophagitis: Secondary | ICD-10-CM | POA: Diagnosis present

## 2023-10-01 DIAGNOSIS — D500 Iron deficiency anemia secondary to blood loss (chronic): Secondary | ICD-10-CM | POA: Diagnosis not present

## 2023-10-01 DIAGNOSIS — K31819 Angiodysplasia of stomach and duodenum without bleeding: Secondary | ICD-10-CM | POA: Diagnosis not present

## 2023-10-01 DIAGNOSIS — E119 Type 2 diabetes mellitus without complications: Secondary | ICD-10-CM

## 2023-10-01 DIAGNOSIS — E1169 Type 2 diabetes mellitus with other specified complication: Secondary | ICD-10-CM | POA: Diagnosis present

## 2023-10-01 DIAGNOSIS — K2970 Gastritis, unspecified, without bleeding: Secondary | ICD-10-CM | POA: Diagnosis present

## 2023-10-01 DIAGNOSIS — R195 Other fecal abnormalities: Secondary | ICD-10-CM

## 2023-10-01 DIAGNOSIS — K2950 Unspecified chronic gastritis without bleeding: Secondary | ICD-10-CM | POA: Diagnosis not present

## 2023-10-01 DIAGNOSIS — M1990 Unspecified osteoarthritis, unspecified site: Secondary | ICD-10-CM | POA: Diagnosis not present

## 2023-10-01 DIAGNOSIS — I252 Old myocardial infarction: Secondary | ICD-10-CM | POA: Diagnosis not present

## 2023-10-01 DIAGNOSIS — D6861 Antiphospholipid syndrome: Secondary | ICD-10-CM | POA: Diagnosis not present

## 2023-10-01 DIAGNOSIS — T45515A Adverse effect of anticoagulants, initial encounter: Secondary | ICD-10-CM | POA: Diagnosis not present

## 2023-10-01 DIAGNOSIS — M359 Systemic involvement of connective tissue, unspecified: Secondary | ICD-10-CM

## 2023-10-01 DIAGNOSIS — R0602 Shortness of breath: Secondary | ICD-10-CM | POA: Diagnosis not present

## 2023-10-01 DIAGNOSIS — G894 Chronic pain syndrome: Secondary | ICD-10-CM

## 2023-10-01 DIAGNOSIS — Z96652 Presence of left artificial knee joint: Secondary | ICD-10-CM | POA: Diagnosis not present

## 2023-10-01 DIAGNOSIS — E669 Obesity, unspecified: Secondary | ICD-10-CM | POA: Diagnosis present

## 2023-10-01 DIAGNOSIS — Z7901 Long term (current) use of anticoagulants: Secondary | ICD-10-CM

## 2023-10-01 DIAGNOSIS — R918 Other nonspecific abnormal finding of lung field: Secondary | ICD-10-CM | POA: Diagnosis not present

## 2023-10-01 DIAGNOSIS — Z823 Family history of stroke: Secondary | ICD-10-CM | POA: Diagnosis not present

## 2023-10-01 DIAGNOSIS — D62 Acute posthemorrhagic anemia: Secondary | ICD-10-CM | POA: Diagnosis not present

## 2023-10-01 DIAGNOSIS — E785 Hyperlipidemia, unspecified: Secondary | ICD-10-CM | POA: Diagnosis not present

## 2023-10-01 DIAGNOSIS — D6832 Hemorrhagic disorder due to extrinsic circulating anticoagulants: Secondary | ICD-10-CM | POA: Diagnosis present

## 2023-10-01 DIAGNOSIS — Z79891 Long term (current) use of opiate analgesic: Secondary | ICD-10-CM

## 2023-10-01 DIAGNOSIS — Z96641 Presence of right artificial hip joint: Secondary | ICD-10-CM | POA: Diagnosis present

## 2023-10-01 DIAGNOSIS — I2510 Atherosclerotic heart disease of native coronary artery without angina pectoris: Secondary | ICD-10-CM | POA: Diagnosis present

## 2023-10-01 DIAGNOSIS — K31811 Angiodysplasia of stomach and duodenum with bleeding: Secondary | ICD-10-CM | POA: Diagnosis not present

## 2023-10-01 DIAGNOSIS — Z7902 Long term (current) use of antithrombotics/antiplatelets: Secondary | ICD-10-CM | POA: Diagnosis not present

## 2023-10-01 DIAGNOSIS — Z87891 Personal history of nicotine dependence: Secondary | ICD-10-CM

## 2023-10-01 DIAGNOSIS — K5710 Diverticulosis of small intestine without perforation or abscess without bleeding: Secondary | ICD-10-CM | POA: Diagnosis present

## 2023-10-01 DIAGNOSIS — E559 Vitamin D deficiency, unspecified: Secondary | ICD-10-CM

## 2023-10-01 DIAGNOSIS — F321 Major depressive disorder, single episode, moderate: Secondary | ICD-10-CM

## 2023-10-01 DIAGNOSIS — I679 Cerebrovascular disease, unspecified: Secondary | ICD-10-CM | POA: Diagnosis not present

## 2023-10-01 DIAGNOSIS — R531 Weakness: Principal | ICD-10-CM

## 2023-10-01 DIAGNOSIS — Z79899 Other long term (current) drug therapy: Secondary | ICD-10-CM | POA: Diagnosis not present

## 2023-10-01 DIAGNOSIS — L309 Dermatitis, unspecified: Secondary | ICD-10-CM

## 2023-10-01 DIAGNOSIS — Z86718 Personal history of other venous thrombosis and embolism: Secondary | ICD-10-CM | POA: Diagnosis not present

## 2023-10-01 DIAGNOSIS — F418 Other specified anxiety disorders: Secondary | ICD-10-CM | POA: Diagnosis not present

## 2023-10-01 DIAGNOSIS — Z8719 Personal history of other diseases of the digestive system: Secondary | ICD-10-CM

## 2023-10-01 DIAGNOSIS — Z7952 Long term (current) use of systemic steroids: Secondary | ICD-10-CM

## 2023-10-01 DIAGNOSIS — K5520 Angiodysplasia of colon without hemorrhage: Secondary | ICD-10-CM | POA: Diagnosis not present

## 2023-10-01 DIAGNOSIS — Z9049 Acquired absence of other specified parts of digestive tract: Secondary | ICD-10-CM

## 2023-10-01 DIAGNOSIS — Z8673 Personal history of transient ischemic attack (TIA), and cerebral infarction without residual deficits: Secondary | ICD-10-CM | POA: Diagnosis not present

## 2023-10-01 DIAGNOSIS — Z880 Allergy status to penicillin: Secondary | ICD-10-CM

## 2023-10-01 LAB — COMPREHENSIVE METABOLIC PANEL WITH GFR
ALT: 19 U/L (ref 0–35)
ALT: 20 U/L (ref 0–44)
AST: 24 U/L (ref 0–37)
AST: 28 U/L (ref 15–41)
Albumin: 2.9 g/dL — ABNORMAL LOW (ref 3.5–5.2)
Albumin: 2.7 g/dL — ABNORMAL LOW (ref 3.5–5.0)
Alkaline Phosphatase: 122 U/L — ABNORMAL HIGH (ref 39–117)
Alkaline Phosphatase: 125 U/L (ref 38–126)
Anion gap: 6 (ref 5–15)
BUN: 12 mg/dL (ref 6–23)
BUN: 13 mg/dL (ref 8–23)
CO2: 27 meq/L (ref 19–32)
CO2: 24 mmol/L (ref 22–32)
Calcium: 8.1 mg/dL — ABNORMAL LOW (ref 8.4–10.5)
Calcium: 8.1 mg/dL — ABNORMAL LOW (ref 8.9–10.3)
Chloride: 107 meq/L (ref 96–112)
Chloride: 107 mmol/L (ref 98–111)
Creatinine, Ser: 0.77 mg/dL (ref 0.40–1.20)
Creatinine, Ser: 0.82 mg/dL (ref 0.44–1.00)
GFR, Estimated: >60 mL/min (ref >60)
GFR: 76.05 mL/min (ref >60.00)
Glucose, Bld: 152 mg/dL — ABNORMAL HIGH (ref 70–99)
Glucose, Bld: 93 mg/dL (ref 70–99)
Potassium: 4.1 meq/L (ref 3.5–5.1)
Potassium: 3.7 mmol/L (ref 3.5–5.1)
Sodium: 139 meq/L (ref 135–145)
Sodium: 137 mmol/L (ref 135–145)
Total Bilirubin: 0.4 mg/dL (ref 0.2–1.2)
Total Bilirubin: 0.4 mg/dL (ref 0.0–1.2)
Total Protein: 5.7 g/dL — ABNORMAL LOW (ref 6.0–8.3)
Total Protein: 5.8 g/dL — ABNORMAL LOW (ref 6.5–8.1)

## 2023-10-01 LAB — CBC WITH DIFFERENTIAL/PLATELET
Abs Immature Granulocytes: 0.02 10*3/uL (ref 0.00–0.07)
Basophils Absolute: 0 10*3/uL (ref 0.0–0.1)
Basophils Absolute: 0 10*3/uL (ref 0.0–0.1)
Basophils Relative: 1 %
Basophils Relative: 0.7 % (ref 0.0–3.0)
Eosinophils Absolute: 0.1 10*3/uL (ref 0.0–0.5)
Eosinophils Absolute: 0 10*3/uL (ref 0.0–0.7)
Eosinophils Relative: 2 %
Eosinophils Relative: 0.5 % (ref 0.0–5.0)
HCT: 19.6 % — ABNORMAL LOW (ref 36.0–46.0)
HCT: 18.7 % — CL (ref 36.0–46.0)
Hemoglobin: 5.7 g/dL — CL (ref 12.0–15.0)
Hemoglobin: 5.8 g/dL — CL (ref 12.0–15.0)
Immature Granulocytes: 0 %
Lymphocytes Relative: 32 %
Lymphocytes Relative: 19.1 % (ref 12.0–46.0)
Lymphs Abs: 1.9 10*3/uL (ref 0.7–4.0)
Lymphs Abs: 1 10*3/uL (ref 0.7–4.0)
MCH: 28.6 pg (ref 26.0–34.0)
MCHC: 29.1 g/dL — ABNORMAL LOW (ref 30.0–36.0)
MCHC: 31.1 g/dL (ref 30.0–36.0)
MCV: 98.5 fL (ref 80.0–100.0)
MCV: 90.9 fl (ref 78.0–100.0)
Monocytes Absolute: 0.6 10*3/uL (ref 0.1–1.0)
Monocytes Absolute: 0.3 10*3/uL (ref 0.1–1.0)
Monocytes Relative: 11 %
Monocytes Relative: 6 % (ref 3.0–12.0)
Neutro Abs: 3.2 10*3/uL (ref 1.7–7.7)
Neutro Abs: 3.7 10*3/uL (ref 1.4–7.7)
Neutrophils Relative %: 54 %
Neutrophils Relative %: 73.7 % (ref 43.0–77.0)
Platelets: 284 10*3/uL (ref 150–400)
Platelets: 285 10*3/uL (ref 150.0–400.0)
RBC: 1.99 MIL/uL — ABNORMAL LOW (ref 3.87–5.11)
RBC: 2.05 Mil/uL — ABNORMAL LOW (ref 3.87–5.11)
RDW: 16.1 % — ABNORMAL HIGH (ref 11.5–15.5)
RDW: 16.9 % — ABNORMAL HIGH (ref 11.5–15.5)
WBC: 5.9 10*3/uL (ref 4.0–10.5)
WBC: 5.1 10*3/uL (ref 4.0–10.5)
nRBC: 0 % (ref 0.0–0.2)

## 2023-10-01 LAB — POC OCCULT BLOOD, ED: Fecal Occult Bld: POSITIVE — AB

## 2023-10-01 LAB — RETICULOCYTES
Immature Retic Fract: 21.3 % — ABNORMAL HIGH (ref 2.3–15.9)
RBC.: 2.01 MIL/uL — ABNORMAL LOW (ref 3.87–5.11)
Retic Count, Absolute: 88.4 10*3/uL (ref 19.0–186.0)
Retic Ct Pct: 4.4 % — ABNORMAL HIGH (ref 0.4–3.1)

## 2023-10-01 LAB — IRON AND TIBC
Iron: 14 ug/dL — ABNORMAL LOW (ref 28–170)
Saturation Ratios: 4 % — ABNORMAL LOW (ref 10.4–31.8)
TIBC: 351 ug/dL (ref 250–450)
UIBC: 337 ug/dL

## 2023-10-01 LAB — PROTIME-INR
INR: 2 — ABNORMAL HIGH (ref 0.8–1.2)
Prothrombin Time: 22.9 s — ABNORMAL HIGH (ref 11.4–15.2)

## 2023-10-01 LAB — FOLATE: Folate: 13.3 ng/mL (ref >5.9)

## 2023-10-01 LAB — PREPARE RBC (CROSSMATCH)

## 2023-10-01 LAB — FERRITIN: Ferritin: 52 ng/mL (ref 11–307)

## 2023-10-01 LAB — VITAMIN B12: Vitamin B-12: 404 pg/mL (ref 180–914)

## 2023-10-01 LAB — HEMOGLOBIN A1C: Hgb A1c MFr Bld: 4.6 % (ref 4.6–6.5)

## 2023-10-01 MED ORDER — ROPINIROLE HCL 1 MG PO TABS
3.0000 mg | ORAL_TABLET | Freq: Once | ORAL | Status: AC
  Administered 2023-10-01: 3 mg via ORAL
  Filled 2023-10-01: qty 3

## 2023-10-01 MED ORDER — PANTOPRAZOLE SODIUM 40 MG IV SOLR
40.0000 mg | Freq: Two times a day (BID) | INTRAVENOUS | Status: DC
  Administered 2023-10-01 – 2023-10-05 (×9): 40 mg via INTRAVENOUS
  Filled 2023-10-01 (×9): qty 10

## 2023-10-01 MED ORDER — OXYCODONE-ACETAMINOPHEN 10-325 MG PO TABS: 1.0000 | ORAL_TABLET | Freq: Every day | ORAL | 0 refills | Status: DC

## 2023-10-01 MED ORDER — DEXTROSE IN LACTATED RINGERS 5 % IV SOLN: INTRAVENOUS | Status: AC

## 2023-10-01 MED ORDER — ONDANSETRON HCL 4 MG PO TABS: 4.0000 mg | ORAL_TABLET | Freq: Four times a day (QID) | ORAL | Status: DC | PRN

## 2023-10-01 MED ORDER — ONDANSETRON HCL 4 MG/2ML IJ SOLN
4.0000 mg | Freq: Four times a day (QID) | INTRAMUSCULAR | Status: DC | PRN
  Administered 2023-10-04: 4 mg via INTRAVENOUS
  Filled 2023-10-01: qty 2

## 2023-10-01 MED ORDER — PREGABALIN 25 MG PO CAPS: 25.0000 mg | ORAL_CAPSULE | Freq: Two times a day (BID) | ORAL | 0 refills | Status: DC

## 2023-10-01 MED ORDER — TRIAMCINOLONE ACETONIDE 0.5 % EX OINT: 1.0000 | TOPICAL_OINTMENT | Freq: Two times a day (BID) | CUTANEOUS | 0 refills | Status: AC

## 2023-10-01 MED ORDER — PANTOPRAZOLE SODIUM 40 MG IV SOLR
80.0000 mg | Freq: Once | INTRAVENOUS | Status: AC
  Administered 2023-10-01: 80 mg via INTRAVENOUS
  Filled 2023-10-01: qty 20

## 2023-10-01 MED ORDER — SODIUM CHLORIDE 0.9% IV SOLUTION: Freq: Once | INTRAVENOUS | Status: AC

## 2023-10-01 MED ORDER — MUSCLE RUB 10-15 % EX CREA
1.0000 | TOPICAL_CREAM | CUTANEOUS | Status: DC | PRN
  Administered 2023-10-03: 1 via TOPICAL
  Filled 2023-10-01 (×3): qty 85

## 2023-10-01 NOTE — ED Provider Triage Note (Signed)
 Emergency Medicine Provider Triage Evaluation Note  [name] Good , a 73 y.o. female  was evaluated in triage.  Pt complains of low hemoglobin. Patient states she is 3 weeks post-op from left knee replacement and went to PCP office today and had labs completed. Patient states that PCP called and hemoglobin was 5.8. Patient states she feels drowsy, light-headed, and a little short of breath. Denies fever, chills, chest pain.   Review of Systems  Positive: Drowsy, light-headed, short of breath, weakness Negative: Fever, chills, chest pan  Physical Exam  BP (!) 113/52   Pulse 90   Temp 98 F (36.7 C) (Oral)   Resp (!) 30   SpO2 100%  Gen:   Awake, no distress   Resp:  Normal effort, heart and lungs sound normal MSK:   Moves extremities without difficulty  Other:  Patient comfortable in no acute distress at time of exam.   Medical Decision Making  Medically screening exam initiated at 5:21 PM.  Appropriate orders placed.  [name] Good was informed that the remainder of the evaluation will be completed by another provider, this initial triage assessment does not replace that evaluation, and the importance of remaining in the ED until their evaluation is complete.  Orders: CBC, BMP, Type and screen, PT/INR, CXR, EKG   Fonda Hymen, PA-C 10/01/23 1724

## 2023-10-01 NOTE — Telephone Encounter (Signed)
 Pt's daughter, Nalini Alcaraz, contacted coumadin  clinic because her mother got a call that she should go to ER but she couldn't remember why or what they told her.   Advised of lab results and that these are critical. Advised pt should go to ER. She is going to take pt to [facility] ER. Advised this nurse will f/u with her next week. Siennah verbalized understanding and was appreciative of the help.

## 2023-10-01 NOTE — Telephone Encounter (Signed)
 Spoke with Dr Daneil Dunker, Dr Daneil Dunker advise patient go to ED due to abnormal labs  Patient notified, verbalized understanding  Hemoglobin at 5.8 Hemacorite 18.7

## 2023-10-01 NOTE — Assessment & Plan Note (Signed)
 Check A1c.

## 2023-10-01 NOTE — Assessment & Plan Note (Signed)
 No longer seeing rheumatology for this.  She is no longer on methotrexate as she did not find it was beneficial.  She is on chronic low-dose prednisone  5 mg daily.

## 2023-10-01 NOTE — Patient Instructions (Signed)
 It was very nice to see you today!  We will check blood work today.  I will refill your pain medications.  Please try the Lyrica.  Let me know how this is working for a few weeks.  I will see back in 3 months.  Come back sooner if needed.  Return in about 3 months (around 01/01/2024) for Follow Up.   Take care, Dr Daneil Dunker  PLEASE NOTE:  If you had any lab tests, please let us  know if you have not heard back within a few days. You may see your results on mychart before we have a chance to review them but we will give you a call once they are reviewed by us .   If we ordered any referrals today, please let us  know if you have not heard from their office within the next week.   If you had any urgent prescriptions sent in today, please check with the pharmacy within an hour of our visit to make sure the prescription was transmitted appropriately.   Please try these tips to maintain a healthy lifestyle:  Eat at least 3 REAL meals and 1-2 snacks per day.  Aim for no more than 5 hours between eating.  If you eat breakfast, please do so within one hour of getting up.   Each meal should contain half fruits/vegetables, one quarter protein, and one quarter carbs (no bigger than a computer mouse)  Cut down on sweet beverages. This includes juice, soda, and sweet tea.   Drink at least 1 glass of water with each meal and aim for at least 8 glasses per day  Exercise at least 150 minutes every week.

## 2023-10-01 NOTE — ED Provider Notes (Signed)
 [place] EMERGENCY DEPARTMENT AT [facility] Provider Note   CSN: [id number] Arrival date & time: 10/01/23  1655     History  Chief Complaint  Patient presents with   Shortness of Breath    Pt is 3 weeks s/p L knee replacement, labs were drawn today at PCP for iron check and pt has been sob, Hgb came back at 5.5.     [name] Good is a 73 y.o. female.  73 y.o. 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  Patient presents with gradually worsening weakness and anemia.  Hemoglobin obtained in the outpatient setting suggest that her hemoglobin is 5.8.  Patient denies bleeding.  INR checked recently was mildly supratherapeutic at 3.8.   The history is provided by the patient and medical records.       Home Medications Prior to Admission medications   Medication Sig Start Date End Date Taking? Authorizing Provider  atorvastatin  (LIPITOR ) 80 MG tablet Take 1 tablet (80 mg total) by mouth daily. 12/29/22   Rodney Clamp, MD  baclofen  (LIORESAL ) 10 MG tablet Take 1 tablet (10 mg total) by mouth 3 (three) times daily as needed for muscle spasms. 09/04/23   Arnie Lao, MD  calcium  carbonate (TUMS EX) 750 MG chewable tablet Chew 1 tablet by mouth daily as needed for heartburn.    [provider]  chlorhexidine  (HIBICLENS ) 4 % external liquid Apply 15 mLs (1 Application total) topically as directed for 30 doses. Use as directed daily for 5 days every other week for 6 weeks. Patient not taking: Reported on 10/01/2023 08/31/23   Arnie Lao, MD  clopidogrel  (PLAVIX ) 75 MG tablet Take 1 tablet (75 mg total) by mouth daily. 07/27/23   Rodney Clamp, MD  diphenhydrAMINE  (BENADRYL ) 25 mg capsule Take 25 mg by mouth at bedtime.    [provider]  docusate sodium  (COLACE) 100 MG capsule Take 100 mg by mouth daily as needed  for mild constipation or moderate constipation.    [provider]  escitalopram  (LEXAPRO ) 20 MG tablet TAKE 1 TABLET BY MOUTH DAILY 03/29/23   Rodney Clamp, MD  ezetimibe  (ZETIA ) 10 MG tablet Take 1 tablet (10 mg total) by mouth daily. 08/05/23   Rodney Clamp, MD  famotidine  (PEPCID ) 20 MG tablet Take 20 mg by mouth daily as needed for heartburn or indigestion.    [provider]  oxyCODONE -acetaminophen  (PERCOCET) 10-325 MG tablet Take 1 tablet by mouth 5 (five) times daily. 10/01/23   Rodney Clamp, MD  phentermine  (ADIPEX-P ) 37.5 MG tablet TAKE 1 TABLET BY MOUTH EVERY MORNING 08/02/23   Rodney Clamp, MD  predniSONE  (DELTASONE ) 5 MG tablet TAKE 1 TABLET BY MOUTH DAILY 11/02/22   Rodney Clamp, MD  pregabalin (LYRICA) 25 MG capsule Take 1 capsule (25 mg total) by mouth 2 (two) times daily. 10/01/23   Rodney Clamp, MD  triamcinolone  ointment (KENALOG ) 0.5 % Apply 1 Application topically 2 (two) times daily. 10/01/23   Parker, Caleb M, MD  warfarin (COUMADIN ) 2.5 MG tablet TAKE 1 TABLET BY MOUTH DAILY OR AS DIRECTED BY ANTICOAGULATION CLINIC 08/03/23   Rodney Clamp, MD      Allergies    Amoxicillin    Review of Systems   Review of Systems  All other systems reviewed and are negative.   Physical Exam Updated Vital Signs BP 125/62  Pulse 89   Temp 98 F (36.7 C) (Oral)   Resp (!) 25   Ht 5\' 3"  (1.6 m)   Wt 84 kg   SpO2 100%   BMI 32.80 kg/m  Physical Exam Vitals and nursing note reviewed.  Constitutional:      General: She is not in acute distress.    Appearance: Normal appearance. She is well-developed.  HENT:     Head: Normocephalic and atraumatic.  Eyes:     Conjunctiva/sclera: Conjunctivae normal.     Pupils: Pupils are equal, round, and reactive to light.  Cardiovascular:     Rate and Rhythm: Normal rate and regular rhythm.     Heart sounds: Normal heart sounds.  Pulmonary:     Effort: Pulmonary effort is normal. No respiratory distress.      Breath sounds: Normal breath sounds.  Abdominal:     General: There is no distension.     Palpations: Abdomen is soft.     Tenderness: There is no abdominal tenderness.  Musculoskeletal:        General: No deformity. Normal range of motion.     Cervical back: Normal range of motion and neck supple.  Skin:    General: Skin is warm and dry.  Neurological:     General: No focal deficit present.     Mental Status: She is alert and oriented to person, place, and time.     ED Results / Procedures / Treatments   Labs (all labs ordered are listed, but only abnormal results are displayed) Labs Reviewed  POC OCCULT BLOOD, ED - Abnormal; Notable for the following components:      Result Value   Fecal Occult Bld POSITIVE (*)    All other components within normal limits  CBC WITH DIFFERENTIAL/PLATELET  PROTIME-INR  COMPREHENSIVE METABOLIC PANEL WITH GFR  TYPE AND SCREEN    EKG EKG Interpretation Date/Time:  Friday October 01 2023 18:03:35 EDT Ventricular Rate:  89 PR Interval:  151 QRS Duration:  74 QT Interval:  389 QTC Calculation: 474 R Axis:   53  Text Interpretation: Sinus rhythm Low voltage, precordial leads Confirmed by Angela Kell [phone]) on 10/01/2023 6:13:10 PM  Radiology DG Chest 2 View Result Date: 10/01/2023 CLINICAL DATA:  Shortness of breath and anemia EXAM: CHEST - 2 VIEW COMPARISON:  Chest radiograph dated 03/08/2020 FINDINGS: Left lower lung patchy opacity. Blunting of the bilateral costophrenic angles. No pneumothorax. Similar mildly enlarged cardiomediastinal silhouette. No acute osseous abnormality. IMPRESSION: 1. Left lower lung patchy opacity, which may represent atelectasis or pulmonary edema. Aspiration or pneumonia can be considered in the appropriate clinical setting. 2. Blunting of the bilateral costophrenic angles, which may represent trace pleural effusions. Electronically Signed   By: Limin  Xu M.D.   On: 10/01/2023 17:56    Procedures Procedures     Medications Ordered in ED Medications  pantoprazole  (PROTONIX ) injection 80 mg (80 mg Intravenous Given 10/01/23 1820)    ED Course/ Medical Decision Making/ A&P                                 Medical Decision Making Amount and/or Complexity of Data Reviewed Labs: ordered.  Risk Prescription drug management.    Medical Screen Complete  This patient presented to the ED with complaint of weakness, anemia.  This complaint involves an extensive number of treatment options. The initial differential diagnosis includes, but is not limited to, anemia,  GI bleed, metabolic abnormality, etc.  This presentation is: Acute, Chronic, Self-Limited, Previously Undiagnosed, Uncertain Prognosis, Complicated, Systemic Symptoms, and Threat to Life/Bodily Function  Patient is presenting with worsening weakness and anemia.  Hemoglobin today is 5.7.  Patient is on Coumadin  for history of stroke and SMV thrombosis.  Patient with INR today of 2.0.  Patient's hemodynamics are stable.    Patient is guaiac positive.  Blood transfusion initiated in the ED.  Hospitalist service is aware of case.    Dr. Kimble Pennant, covering [facility], given message regarding need for consultation.  Additional history obtained:  External records from outside sources obtained and reviewed including prior ED visits and prior Inpatient records.    Problem List / ED Course:  Anemia, GI bleed, weakness  Disposition:  After consideration of the diagnostic results and the patients response to treatment, I feel that the patent would benefit from admission.          Final Clinical Impression(s) / ED Diagnoses Final diagnoses:  Weakness  Anemia, unspecified type  Gastrointestinal hemorrhage, unspecified gastrointestinal hemorrhage type    Rx / DC Orders ED Discharge Orders     None         Burnette Carte, MD 10/01/23 1910

## 2023-10-01 NOTE — Assessment & Plan Note (Signed)
 She is on Percocet 10-3 25 5  times daily as needed.  Medications do help with ability perform activities daily living though she has had a short-term increase in pain related to her recent knee replacement surgery and rehab.  As above she is having significant anxiety surrounding this as well.  We did discuss additional treatment options.  Did not have much improvement with gabapentin  in the past.  Will try Lyrica 25 mg twice daily.  Discussed potential side effects.  I hope this will help with her chronic pain as well.  She will follow-up with us  in a few weeks via MyChart and we can titrate the dose as tolerated.

## 2023-10-01 NOTE — ED Notes (Signed)
 2 gold tubes sent to lab.

## 2023-10-01 NOTE — Progress Notes (Signed)
 [name] Good is a 72 y.o. female who presents today for an office visit.  Assessment/Plan:  Chronic Problems Addressed Today: Arthritis Had a lengthy discussion with patient today regarding her rehab from recent knee replacement surgery.  She is still having a fair amount of pain though is slowly progressing.  She is currently working with home health for the next 2 weeks and then will progress to outpatient physical therapy soon.  She does have follow-up with orthopedics scheduled soon.  Will defer further management to orthopedics.  Autoimmune disease (HCC) No longer seeing rheumatology for this.  She is no longer on methotrexate as she did not find it was beneficial.  She is on chronic low-dose prednisone  5 mg daily.  Depression, major, single episode, moderate (HCC) Overall her mood and anxiety have significantly worsened in the last few weeks related to recent surgery and rehab.  She does have a lot of anxiety surrounding her rehab appointments as well due to the degree of pain that it causes.  She is interested in something that will help her with her anxiety.  Discussed with patient need to be careful due to her other medications.  We need to avoid benzos due to her being on chronic narcotics.  She is on gabapentin  in the past but did not do well with this.  Will add on Lyrica 25 mg twice daily.  They will follow-up with us  in a few weeks via MyChart and we can adjust his dose as tolerated.  Chronic pain syndrome She is on Percocet 10-3 25 5  times daily as needed.  Medications do help with ability perform activities daily living though she has had a short-term increase in pain related to her recent knee replacement surgery and rehab.  As above she is having significant anxiety surrounding this as well.  We did discuss additional treatment options.  Did not have much improvement with gabapentin  in the past.  Will try Lyrica 25 mg twice daily.  Discussed potential side effects.  I hope this  will help with her chronic pain as well.  She will follow-up with us  in a few weeks via MyChart and we can titrate the dose as tolerated.  Vitamin D deficiency Check vitamin D.  Was previously on 50,000 IUs once weekly.  Dermatitis On triamcinolone  twice daily as needed.  Will refill today.  T2DM (type 2 diabetes mellitus) (HCC) Check A1c.     Subjective:  HPI:  See A/P for status of chronic conditions.  Patient is here today for follow-up.  I last saw her about 2 months ago.  Since our last visit she underwent left knee replacement.  She has been following with orthopedics for this.  She did rehab in a skilled nursing facility and was discharged last week.  She has been working with home health.  Over the last week or so.  She is here today with her daughter.  Overall she is still having quite a bit of pain in her leg.  Daughter also notes that she is having much more anxiety relating to her physical therapy sessions as well.  Overall symptoms do seem to be progressing though she feels like she has a few weeks behind where she should be.  Daughter does note that while she was at the nursing home she was on several medications that she is no longer on including the methotrexate and ropinirole .  Patient also would like to have her labs checked today as she was being prescribed vitamin  D and iron while in the nursing home and is not sure if she needs to be on these any longer either.        Objective:  Physical Exam: BP 131/78   Pulse 96   Temp (!) 97.3 F (36.3 C) (Temporal)   Ht 5\' 3"  (1.6 m)   SpO2 98%   BMI 32.84 kg/m   Gen: No acute distress, resting comfortably CV: Regular rate and rhythm with no murmurs appreciated Pulm: Normal work of breathing, clear to auscultation bilaterally with no crackles, wheezes, or rhonchi Neuro: Grossly normal, moves all extremities Psych: Normal affect and thought content  Time Spent: 45 minutes of total time was spent on the date of the  encounter performing the following actions: chart review prior to seeing the patient including her recent hospitalization and rehab stay, obtaining history, performing a medically necessary exam, counseling on the treatment plan, placing orders, and documenting in our EHR.        Jinny Mounts. Daneil Dunker, MD 10/01/2023 12:14 PM

## 2023-10-01 NOTE — Assessment & Plan Note (Signed)
 Check vitamin D.  Was previously on 50,000 IUs once weekly.

## 2023-10-01 NOTE — Assessment & Plan Note (Signed)
 Had a lengthy discussion with patient today regarding her rehab from recent knee replacement surgery.  She is still having a fair amount of pain though is slowly progressing.  She is currently working with home health for the next 2 weeks and then will progress to outpatient physical therapy soon.  She does have follow-up with orthopedics scheduled soon.  Will defer further management to orthopedics.

## 2023-10-01 NOTE — Assessment & Plan Note (Signed)
 Overall her mood and anxiety have significantly worsened in the last few weeks related to recent surgery and rehab.  She does have a lot of anxiety surrounding her rehab appointments as well due to the degree of pain that it causes.  She is interested in something that will help her with her anxiety.  Discussed with patient need to be careful due to her other medications.  We need to avoid benzos due to her being on chronic narcotics.  She is on gabapentin  in the past but did not do well with this.  Will add on Lyrica 25 mg twice daily.  They will follow-up with us  in a few weeks via MyChart and we can adjust his dose as tolerated.

## 2023-10-01 NOTE — Telephone Encounter (Signed)
 Critical from [facility] from [place]   Low hemoglin 5.8   Hemacorite 18.7

## 2023-10-01 NOTE — Assessment & Plan Note (Signed)
 On triamcinolone  twice daily as needed.  Will refill today.

## 2023-10-01 NOTE — Telephone Encounter (Signed)
 Spoke to patient's daughter and confirmed critical result and advice per Dr. Daneil Dunker to go to ED.

## 2023-10-02 DIAGNOSIS — Z7902 Long term (current) use of antithrombotics/antiplatelets: Secondary | ICD-10-CM

## 2023-10-02 DIAGNOSIS — K219 Gastro-esophageal reflux disease without esophagitis: Secondary | ICD-10-CM

## 2023-10-02 DIAGNOSIS — M359 Systemic involvement of connective tissue, unspecified: Secondary | ICD-10-CM | POA: Diagnosis not present

## 2023-10-02 DIAGNOSIS — Z7901 Long term (current) use of anticoagulants: Secondary | ICD-10-CM

## 2023-10-02 DIAGNOSIS — D500 Iron deficiency anemia secondary to blood loss (chronic): Secondary | ICD-10-CM

## 2023-10-02 DIAGNOSIS — Z8673 Personal history of transient ischemic attack (TIA), and cerebral infarction without residual deficits: Secondary | ICD-10-CM

## 2023-10-02 DIAGNOSIS — D649 Anemia, unspecified: Secondary | ICD-10-CM | POA: Diagnosis not present

## 2023-10-02 DIAGNOSIS — R195 Other fecal abnormalities: Secondary | ICD-10-CM

## 2023-10-02 LAB — CBC
HCT: 23.9 % — ABNORMAL LOW (ref 36.0–46.0)
HCT: 25 % — ABNORMAL LOW (ref 36.0–46.0)
HCT: 30.8 % — ABNORMAL LOW (ref 36.0–46.0)
HCT: 24.4 % — ABNORMAL LOW (ref 36.0–46.0)
Hemoglobin: 7 g/dL — ABNORMAL LOW (ref 12.0–15.0)
Hemoglobin: 7.5 g/dL — ABNORMAL LOW (ref 12.0–15.0)
Hemoglobin: 9 g/dL — ABNORMAL LOW (ref 12.0–15.0)
Hemoglobin: 7.5 g/dL — ABNORMAL LOW (ref 12.0–15.0)
MCH: 28 pg (ref 26.0–34.0)
MCH: 28.3 pg (ref 26.0–34.0)
MCH: 28.1 pg (ref 26.0–34.0)
MCH: 28.3 pg (ref 26.0–34.0)
MCHC: 29.3 g/dL — ABNORMAL LOW (ref 30.0–36.0)
MCHC: 30 g/dL (ref 30.0–36.0)
MCHC: 29.2 g/dL — ABNORMAL LOW (ref 30.0–36.0)
MCHC: 30.7 g/dL (ref 30.0–36.0)
MCV: 95.6 fL (ref 80.0–100.0)
MCV: 94.3 fL (ref 80.0–100.0)
MCV: 96.3 fL (ref 80.0–100.0)
MCV: 92.1 fL (ref 80.0–100.0)
Platelets: 243 10*3/uL (ref 150–400)
Platelets: 267 10*3/uL (ref 150–400)
Platelets: 286 10*3/uL (ref 150–400)
Platelets: 258 10*3/uL (ref 150–400)
RBC: 2.5 MIL/uL — ABNORMAL LOW (ref 3.87–5.11)
RBC: 2.65 MIL/uL — ABNORMAL LOW (ref 3.87–5.11)
RBC: 3.2 MIL/uL — ABNORMAL LOW (ref 3.87–5.11)
RBC: 2.65 MIL/uL — ABNORMAL LOW (ref 3.87–5.11)
RDW: 17.8 % — ABNORMAL HIGH (ref 11.5–15.5)
RDW: 18.2 % — ABNORMAL HIGH (ref 11.5–15.5)
RDW: 17.8 % — ABNORMAL HIGH (ref 11.5–15.5)
RDW: 17.8 % — ABNORMAL HIGH (ref 11.5–15.5)
WBC: 5.2 10*3/uL (ref 4.0–10.5)
WBC: 5.6 10*3/uL (ref 4.0–10.5)
WBC: 5.9 10*3/uL (ref 4.0–10.5)
WBC: 5.8 10*3/uL (ref 4.0–10.5)
nRBC: 0 % (ref 0.0–0.2)
nRBC: 0 % (ref 0.0–0.2)
nRBC: 0 % (ref 0.0–0.2)
nRBC: 0 % (ref 0.0–0.2)

## 2023-10-02 LAB — COMPREHENSIVE METABOLIC PANEL WITH GFR
ALT: 18 U/L (ref 0–44)
AST: 22 U/L (ref 15–41)
Albumin: 2.2 g/dL — ABNORMAL LOW (ref 3.5–5.0)
Alkaline Phosphatase: 102 U/L (ref 38–126)
Anion gap: 5 (ref 5–15)
BUN: 9 mg/dL (ref 8–23)
CO2: 23 mmol/L (ref 22–32)
Calcium: 7.8 mg/dL — ABNORMAL LOW (ref 8.9–10.3)
Chloride: 110 mmol/L (ref 98–111)
Creatinine, Ser: 0.67 mg/dL (ref 0.44–1.00)
GFR, Estimated: >60 mL/min
Glucose, Bld: 103 mg/dL — ABNORMAL HIGH (ref 70–99)
Potassium: 3.6 mmol/L (ref 3.5–5.1)
Sodium: 138 mmol/L (ref 135–145)
Total Bilirubin: 0.8 mg/dL (ref 0.0–1.2)
Total Protein: 4.9 g/dL — ABNORMAL LOW (ref 6.5–8.1)

## 2023-10-02 LAB — BPAM RBC
Blood Product Expiration Date: 202507072359
Blood Product Expiration Date: 202507072359
ISSUE DATE / TIME: 202506062029
ISSUE DATE / TIME: 202506062316
Unit Type and Rh: 6200
Unit Type and Rh: 6200

## 2023-10-02 LAB — TYPE AND SCREEN
ABO/RH(D): A POS
Antibody Screen: NEGATIVE
Unit division: 0
Unit division: 0

## 2023-10-02 LAB — GLUCOSE, CAPILLARY
Glucose-Capillary: 129 mg/dL — ABNORMAL HIGH (ref 70–99)
Glucose-Capillary: 121 mg/dL — ABNORMAL HIGH (ref 70–99)

## 2023-10-02 MED ORDER — OXYCODONE HCL 5 MG PO TABS
5.0000 mg | ORAL_TABLET | Freq: Four times a day (QID) | ORAL | Status: DC | PRN
  Administered 2023-10-02 – 2023-10-06 (×10): 5 mg via ORAL
  Filled 2023-10-02 (×11): qty 1

## 2023-10-02 MED ORDER — ATORVASTATIN CALCIUM 40 MG PO TABS
80.0000 mg | ORAL_TABLET | Freq: Every day | ORAL | Status: DC
  Administered 2023-10-02 – 2023-10-06 (×4): 80 mg via ORAL
  Filled 2023-10-02 (×4): qty 2

## 2023-10-02 MED ORDER — OXYCODONE-ACETAMINOPHEN 10-325 MG PO TABS: 1.0000 | ORAL_TABLET | Freq: Four times a day (QID) | ORAL | Status: DC | PRN

## 2023-10-02 MED ORDER — DIPHENHYDRAMINE HCL 25 MG PO CAPS
25.0000 mg | ORAL_CAPSULE | Freq: Once | ORAL | Status: AC | PRN
  Administered 2023-10-02: 25 mg via ORAL
  Filled 2023-10-02: qty 1

## 2023-10-02 MED ORDER — OXYCODONE-ACETAMINOPHEN 5-325 MG PO TABS
1.0000 | ORAL_TABLET | Freq: Four times a day (QID) | ORAL | Status: DC | PRN
  Administered 2023-10-02 – 2023-10-06 (×10): 1 via ORAL
  Filled 2023-10-02 (×11): qty 1

## 2023-10-02 MED ORDER — ROPINIROLE HCL 1 MG PO TABS
1.0000 mg | ORAL_TABLET | Freq: Once | ORAL | Status: AC
  Administered 2023-10-02: 1 mg via ORAL
  Filled 2023-10-02: qty 1

## 2023-10-02 MED ORDER — ROPINIROLE HCL 1 MG PO TABS
2.0000 mg | ORAL_TABLET | Freq: Every day | ORAL | Status: DC
  Administered 2023-10-02: 2 mg via ORAL
  Filled 2023-10-02: qty 2

## 2023-10-02 MED ORDER — INSULIN ASPART 100 UNIT/ML IJ SOLN: 0.0000 [IU] | Freq: Three times a day (TID) | INTRAMUSCULAR | Status: DC

## 2023-10-02 NOTE — H&P (Addendum)
 History and Physical    Patient: [name] Good BJY:[id number] DOB: 1950/10/24 DOA: 10/01/2023 DOS: the patient was seen and examined on 10/02/2023 PCP: Rodney Clamp, MD  Patient coming from: Home  Chief Complaint:  Chief Complaint  Patient presents with   Shortness of Breath    Pt is 3 weeks s/p L knee replacement, labs were drawn today at PCP for iron check and pt has been sob, Hgb came back at 5.5.    HPI: [name] Good is a 73 y.o. female with medical history significant of GERD, previous CVA, depression with anxiety, coronary artery disease, history of SMV thrombosis with chronic Coumadin  therapy, presumed antiphospholipid syndrome also on Coumadin  who presented to the ER with progressive shortness of breath and weakness.  Patient was seen by have primary care physician where hemoglobin was found to be 5.8.  Her previous hemoglobin was more than 10.  Patient is also still guaiac positive x 2.  She denied any melena or bright red blood per rectum.  Patient had recent INR of 3.8 but today 2.0.  Suspected symptomatic anemia secondary to slow GI bleed.  [facility] consulted and patient being admitted to the medical service for management.  Also 2 units of packed red blood cells ordered.  Review of Systems: As mentioned in the history of present illness. All other systems reviewed and are negative. Past Medical History:  Diagnosis Date   Anxiety    Arthritis    Depression    GERD (gastroesophageal reflux disease)    Myocardial infarction (HCC) 89   no visits to cardiac dr(thomas kelly) since 89   Stroke (HCC)    Wound dehiscence    lumbar   Past Surgical History:  Procedure Laterality Date   APPENDECTOMY     17 yrs   BACK SURGERY     BREAST SURGERY Left    cyst   BUBBLE STUDY  03/12/2020   Procedure: BUBBLE STUDY;  Surgeon: Pasqual Bone, MD;  Location: [facility] ENDOSCOPY;  Service: Cardiovascular;;   CARDIAC CATHETERIZATION  36   CHOLECYSTECTOMY     73 yrs old   LUMBAR  LAMINECTOMY/DECOMPRESSION MICRODISCECTOMY N/A 06/14/2015   Procedure: Lumbar Three-Four,Lumbar Four-Five, Lumbar Five-Sacral One Laminectomy;  Surgeon: Isadora Mar, MD;  Location: [facility] NEURO ORS;  Service: Neurosurgery;  Laterality: N/A;   LUMBAR WOUND DEBRIDEMENT N/A 07/24/2015   Procedure: lumbar wound revision;  Surgeon: Isadora Mar, MD;  Location: [facility] NEURO ORS;  Service: Neurosurgery;  Laterality: N/A;   TEE WITHOUT CARDIOVERSION N/A 03/12/2020   Procedure: TRANSESOPHAGEAL ECHOCARDIOGRAM (TEE);  Surgeon: Pasqual Bone, MD;  Location: [facility] ENDOSCOPY;  Service: Cardiovascular;  Laterality: N/A;   TOTAL HIP ARTHROPLASTY Right 10/25/2015   Procedure: RIGHT TOTAL HIP ARTHROPLASTY ANTERIOR APPROACH;  Surgeon: Arnie Lao, MD;  Location: [facility] ORS;  Service: Orthopedics;  Laterality: Right;   TOTAL KNEE ARTHROPLASTY Left 08/31/2023   Procedure: ARTHROPLASTY, KNEE, TOTAL;  Surgeon: Arnie Lao, MD;  Location: [facility] OR;  Service: Orthopedics;  Laterality: Left;   TUBAL LIGATION     Social History:  reports that she quit smoking about 10 years ago. Her smoking use included cigarettes. She started smoking about 50 years ago. She has a 80 pack-year smoking history. She has never used smokeless tobacco. She reports that she does not currently use alcohol. She reports that she does not use drugs.  Allergies  Allergen Reactions   Amoxicillin Other (See Comments)    Tolerated Zosyn  Has patient had a PCN  reaction causing immediate rash, facial/tongue/throat swelling, SOB or lightheadedness with hypotension: No Has patient had a PCN reaction causing severe rash involving mucus membranes or skin necrosis: No Has patient had a PCN reaction that required hospitalization No Has patient had a PCN reaction occurring within the last 10 years: No If all of the above answers are "NO", then may proceed with Cephalosporin use.    Family History  Problem Relation Age of Onset   Stroke Mother     Clotting disorder Neg Hx     Prior to Admission medications   Medication Sig Start Date End Date Taking? Authorizing Provider  oxyCODONE -acetaminophen  (PERCOCET) 10-325 MG tablet Take 1 tablet by mouth 5 (five) times daily. 10/01/23  Yes Rodney Clamp, MD  atorvastatin  (LIPITOR ) 80 MG tablet Take 1 tablet (80 mg total) by mouth daily. 12/29/22   Rodney Clamp, MD  baclofen  (LIORESAL ) 10 MG tablet Take 1 tablet (10 mg total) by mouth 3 (three) times daily as needed for muscle spasms. 09/04/23   Arnie Lao, MD  calcium  carbonate (TUMS EX) 750 MG chewable tablet Chew 1 tablet by mouth daily as needed for heartburn.    [provider]  chlorhexidine  (HIBICLENS ) 4 % external liquid Apply 15 mLs (1 Application total) topically as directed for 30 doses. Use as directed daily for 5 days every other week for 6 weeks. Patient not taking: Reported on 10/01/2023 08/31/23   Arnie Lao, MD  clopidogrel  (PLAVIX ) 75 MG tablet Take 1 tablet (75 mg total) by mouth daily. 07/27/23   Rodney Clamp, MD  diphenhydrAMINE  (BENADRYL ) 25 mg capsule Take 25 mg by mouth at bedtime.    [provider]  docusate sodium  (COLACE) 100 MG capsule Take 100 mg by mouth daily as needed for mild constipation or moderate constipation.    [provider]  escitalopram  (LEXAPRO ) 20 MG tablet TAKE 1 TABLET BY MOUTH DAILY 03/29/23   Rodney Clamp, MD  ezetimibe  (ZETIA ) 10 MG tablet Take 1 tablet (10 mg total) by mouth daily. 08/05/23   Rodney Clamp, MD  famotidine  (PEPCID ) 20 MG tablet Take 20 mg by mouth daily as needed for heartburn or indigestion.    [provider]  phentermine  (ADIPEX-P ) 37.5 MG tablet TAKE 1 TABLET BY MOUTH EVERY MORNING 08/02/23   Rodney Clamp, MD  predniSONE  (DELTASONE ) 5 MG tablet TAKE 1 TABLET BY MOUTH DAILY 11/02/22   Rodney Clamp, MD  pregabalin  (LYRICA ) 25 MG capsule Take 1 capsule (25 mg total) by mouth 2 (two) times daily. 10/01/23   Rodney Clamp, MD  triamcinolone  ointment (KENALOG ) 0.5 % Apply 1 Application topically 2 (two) times daily. 10/01/23   Rodney Clamp, MD  warfarin (COUMADIN ) 2.5 MG tablet TAKE 1 TABLET BY MOUTH DAILY OR AS DIRECTED BY ANTICOAGULATION CLINIC 08/03/23   Rodney Clamp, MD    Physical Exam: Vitals:   10/01/23 2051 10/01/23 2305 10/01/23 2334 10/01/23 2349  BP: (!) 123/57 113/63 (!) 103/53 (!) 103/54  Pulse: 82 88 91 92  Resp: 19 18 18 20   Temp: 98.1 F (36.7 C) 98.1 F (36.7 C) 98.6 F (37 C) 98.4 F (36.9 C)  TempSrc: Oral Oral Oral   SpO2: 100% 98%  98%  Weight:      Height:       Constitutional: Acute ill looking, NAD, calm, comfortable Eyes: PERRL, lids and conjunctivae pale  ENMT: Mucous membranes are moist. Posterior pharynx clear of any  exudate or lesions.Normal dentition.  Neck: normal, supple, no masses, no thyromegaly Respiratory: clear to auscultation bilaterally, no wheezing, no crackles. Normal respiratory effort. No accessory muscle use.  Cardiovascular: Regular rate and rhythm, no murmurs / rubs / gallops. No extremity edema. 2+ pedal pulses. No carotid bruits.  Abdomen: no tenderness, no masses palpated. No hepatosplenomegaly. Bowel sounds positive.  Musculoskeletal: Good range of motion, no joint swelling or tenderness, Skin: no rashes, lesions, ulcers. No induration Neurologic: CN 2-12 grossly intact. Sensation intact, DTR normal. Strength 5/5 in all 4.  Psychiatric: Normal judgment and insight. Alert and oriented x 3.  Anxious mood  Data Reviewed:  Blood pressure 123/57, calcium  8.1, iron 14, hemoglobin 5.7 reticulocyte count 4.4 PT 22.9 INR 2.0.  Fecal occult blood testing is positive x 2 chest x-ray showed left lower lobe patchy opacity probably atelectasis and possible trace pleural effusions  Assessment and Plan:  #1 symptomatic anemia: Patient will be admitted.  Transfused 2 units of packed red blood cells, IV Protonix , serial H&H, GI consult.  Patient's INR is 2.0.   Hold the warfarin.  No reversal as INR is only 2.0.  #2 GI bleed: Patient will need GI consult.  Continue Protonix .  #3 history of CVA: Continue current home regimen  #4 type 2 diabetes: Will initiate sliding scale insulin   #5 hyperlipidemia: Continue statin when able.  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  Holding warfarin due to bleed.  Resume Coumadin  when able.    Advance Care Planning:   Code Status: Full Code   Consults: [facility]  Family Communication: No family at bedside  Severity of Illness: The appropriate patient status for this patient is INPATIENT. Inpatient status is judged to be reasonable and necessary in order to provide the required intensity of service to ensure the patient's safety. The patient's presenting symptoms, physical exam findings, and initial radiographic and laboratory data in the context of their chronic comorbidities is felt to place them at high risk for further clinical deterioration. Furthermore, it is not anticipated that the patient will be medically stable for discharge from the hospital within 2 midnights of admission.   * I certify that at the point of admission it is my clinical judgment that the patient will require inpatient hospital care spanning beyond 2 midnights from the point of admission due to high intensity of service, high risk for further deterioration and high frequency of surveillance required.*  AuthorCarolin Chyle, MD 10/02/2023 12:36 AM  For on call review [email or web address].

## 2023-10-02 NOTE — Progress Notes (Signed)
 [facility]  PROGRESS NOTE  [name] Good GEX:[id number] DOB: 1950/12/13 DOA: 10/01/2023 PCP: Rodney Clamp, MD   Brief HPI:   73 y.o. female with medical history significant of GERD, previous CVA, depression with anxiety, coronary artery disease, history of SMV thrombosis with chronic Coumadin  therapy, presumed antiphospholipid syndrome also on Coumadin  who presented to the ER with progressive shortness of breath and weakness.  Patient was seen by have primary care physician where hemoglobin was found to be 5.8.  Her previous hemoglobin was more than 10.  Patient is also still guaiac positive x 2.     Assessment/Plan:   #1 Symptomatic anemia: Presented with shortness of breath and weakness, PCP found that hemoglobin was 5.8.  FOBT was positive.  .  Transfused 2 units of packed red blood cells, IV Protonix , serial H&H, GI consult.  Patient's INR is 2.0.  Hold the warfarin and Plavix .  No reversal as INR is only 2.0.  Hemoglobin is up to 7.5.   #2 GI bleed: FOBT positive.  Patient has been on Plavix  and warfarin at home.  [facility] consulted.  Continue pantoprazole  40 mg IV every 12 hours.   #3 history of CVA: Hold Plavix  due to GI bleed   #4 type 2 diabetes: Continue sliding scale insulin  with NovoLog .  CBG well-controlled   #5 hyperlipidemia: Continue atorvastatin    #6 history of mesenteric thrombosis: On chronic warfarin therapy.  Holding warfarin due to bleed.        Medications     atorvastatin   80 mg Oral Daily   insulin  aspart  0-9 Units Subcutaneous TID WC   pantoprazole  (PROTONIX ) IV  40 mg Intravenous Q12H   rOPINIRole   2 mg Oral Daily     Data Reviewed:   CBG:  No results for input(s): "GLUCAP" in the last 168 hours.  SpO2: 97 %    Vitals:   10/01/23 2349 10/02/23 0145 10/02/23 0613 10/02/23 1215  BP: (!) 103/54 (!) 112/50 (!) 108/56 123/65  Pulse: 92 93 88 88  Resp: 20 18 18  (!) 22  Temp: 98.4 F (36.9 C) 98.7 F (37.1 C) 98.4 F (36.9 C) 98.6 F (37  C)  TempSrc:  Oral Oral Oral  SpO2: 98% 95% 97% 97%  Weight:      Height:          Data Reviewed:  Basic Metabolic Panel: Recent Labs  Lab 10/01/23 1750 10/02/23 0533  NA 137 138  K 3.7 3.6  CL 107 110  CO2 24 23  GLUCOSE 93 103*  BUN 13 9  CREATININE 0.82 0.67  CALCIUM  8.1* 7.8*    CBC: Recent Labs  Lab 10/01/23 0747 10/02/23 0533 10/02/23 0918  WBC 5.9 5.2 5.6  NEUTROABS 3.2  --   --   HGB 5.7* 7.0* 7.5*  HCT 19.6* 23.9* 25.0*  MCV 98.5 95.6 94.3  PLT 284 243 267    LFT Recent Labs  Lab 10/01/23 1750 10/02/23 0533  AST 28 22  ALT 20 18  ALKPHOS 125 102  BILITOT 0.4 0.8  PROT 5.8* 4.9*  ALBUMIN 2.7* 2.2*     Antibiotics: Anti-infectives (From admission, onward)    None        DVT prophylaxis: SCDs  Code Status: Full code  Family Communication: No family at bedside   CONSULTS gastroenterology   Subjective   Denies abdominal pain.   Objective    Physical Examination:   General-appears in no acute distress Heart-S1-S2, regular, no murmur  auscultated Lungs-clear to auscultation bilaterally, no wheezing or crackles auscultated Abdomen-soft, nontender, no organomegaly Extremities-no edema in the lower extremities Neuro-alert, oriented x3, no focal deficit noted  Status is: Inpatient:             [name] Good   [facility] If 7PM-7AM, please contact night-coverage at [email or web address], Office  [phone]   10/02/2023, 3:49 PM  LOS: 1 day

## 2023-10-02 NOTE — Progress Notes (Signed)
 Mobility Specialist - Progress Note   10/02/23 1300  Mobility  Activity Transferred from bed to chair;Transferred to/from [facility]  Level of Assistance Minimal assist, patient does 75% or more  Assistive Device Front wheel walker;BSC  Distance Ambulated (ft) 3 ft  Range of Motion/Exercises Active  Activity Response Tolerated fair  Mobility Referral Yes  Mobility visit 1 Mobility  Mobility Specialist Start Time (ACUTE ONLY) 1300  Mobility Specialist Stop Time (ACUTE ONLY) 1313  Mobility Specialist Time Calculation (min) (ACUTE ONLY) 13 min   Pt was found in bed and agreeable to mobilize. Pt able to complete x3 transfers during session. C/o L knee pain and SOB towards EOS. Was left on recliner chair with all needs met. Call bell in reach and NT in room.   Lorna Rose,  Mobility Specialist Can be reached via Secure Chat

## 2023-10-02 NOTE — Consult Note (Addendum)
 HISTORY OF PRESENT ILLNESS:  [name] Good is a 73 y.o. 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  She was sent to the hospital yesterday by her PCP after routine follow-up blood work revealed anemia with a hemoglobin of 5.7.  Hemoglobin 1 month prior (at the time of her knee surgery) was 9.4.  She was noted to be Hemoccult positive.  Her only significant complaint was fatigue.  She has been transfused 2 units of blood.  Most recent hemoglobin today is 7.5.  For the most part, the patient has had normal hemoglobins in the 13-14 range until about 2 months ago.  Since that time her hemoglobin has been in the 10-11 range.  B12 and folate are normal.  Iron studies suggest iron deficiency with a saturation of 4%.  Ferritin level is 52.  Patient denies melena or hematochezia.  She has no prior history of GI evaluations including colonoscopy or upper endoscopy.  No complaints today.  She is anxious, hungry, and wants to go home.  LABORATORIES: As outlined above in the HPI.  Additionally normal liver tests.  BUN 13 and creatinine 0.82.  Normal electrolytes  REVIEW OF SYSTEMS:  All non-GI ROS negative except for knee pain and anxiety  Past Medical History:  Diagnosis Date   Anxiety    Arthritis    Depression    GERD (gastroesophageal reflux disease)    Myocardial infarction (HCC) 89   no visits to cardiac dr(thomas kelly) since 92   Stroke (HCC)    Wound dehiscence    lumbar    Past Surgical History:  Procedure Laterality Date   APPENDECTOMY     17 yrs   BACK SURGERY     BREAST SURGERY Left    cyst   BUBBLE STUDY  03/12/2020   Procedure: BUBBLE STUDY;  Surgeon: Pasqual Bone, MD;   Location: [facility];  Service: Cardiovascular;;   CARDIAC CATHETERIZATION  72   CHOLECYSTECTOMY     73 yrs old   LUMBAR LAMINECTOMY/DECOMPRESSION MICRODISCECTOMY N/A 06/14/2015   Procedure: Lumbar Three-Four,Lumbar Four-Five, Lumbar Five-Sacral One Laminectomy;  Surgeon: Isadora Mar, MD;  Location: [facility]UR[facility]S;  Service: Neurosurgery;  Laterality: N/A;   LUMBAR WOUND DEBRIDEMENT N/A 07/24/2015   Procedure: lumbar wound revision;  Surgeon: Isadora Mar, MD;  Location: [facility]UR[facility];  Service: Neurosurgery;  Laterality: N/A;   TEE WITHOUT CARDIOVERSION N/A 03/12/2020   Procedure: TRANSESOPHAGEAL ECHOCARDIOGRAM (TEE);  Surgeon: Pasqual Bone, MD;  Location: [facility] ENDOSCOPY;  Service: Cardiovascular;  Laterality: N/A;   TOTAL HIP ARTHROPLASTY Right 10/25/2015   Procedure: RIGHT TOTAL HIP ARTHROPLASTY ANTERIOR APPROACH;  Surgeon: Arnie Lao, MD;  Location: [facility] ORS;  Service: Orthopedics;  Laterality: Right;   TOTAL KNEE ARTHROPLASTY Left 08/31/2023   Procedure: ARTHROPLASTY, KNEE, TOTAL;  Surgeon: Arnie Lao, MD;  Location: [facility] OR;  Service: Orthopedics;  Laterality: Left;   TUBAL LIGATION      Social History TRANIYAH HALLETT  reports that she quit smoking about 10 years ago. Her smoking use included cigarettes. She started smoking about 50 years ago. She has a 80 pack-year smoking history. She has never used smokeless tobacco. She reports that she does not currently use alcohol. She reports that she does not use drugs.  family history includes Stroke in her mother.  Allergies  Allergen Reactions   Amoxicillin Other (See Comments)    Tolerated Zosyn  Has patient had a PCN reaction causing immediate rash, facial/tongue/throat swelling, SOB or lightheadedness with hypotension: No Has patient had a PCN reaction causing severe rash involving mucus membranes or skin necrosis: No Has patient had a PCN reaction that required hospitalization No Has patient had a PCN reaction  occurring within the last 10 years: No If all of the above answers are "NO", then may proceed with Cephalosporin use.       PHYSICAL EXAMINATION: Vital signs: BP 123/65 (BP Location: Right Arm)   Pulse 88   Temp 98.6 F (37 C) (Oral)   Resp (!) 22   Ht 5\' 3"  (1.6 m)   Wt 84 kg   SpO2 97%   BMI 32.80 kg/m   Constitutional: Somewhat anxious, unhealthy appearing, no acute distress Psychiatric: alert and oriented x3, cooperative.  A bit anxious Eyes: extraocular movements intact, anicteric, conjunctiva pink Mouth: oral pharynx moist, no lesions Neck: supple no lymphadenopathy Cardiovascular: heart regular rate and rhythm, no murmur Lungs: clear to auscultation bilaterally Abdomen: soft, obese, nontender, nondistended, no obvious ascites, no peritoneal signs, normal bowel sounds, no organomegaly Rectal: Omitted Extremities: no lower extremity edema bilaterally Skin: no relevant lesions on visible extremities Neuro: No focal deficits.  Cranial nerves intact  ASSESSMENT:  1.  Acute on chronic anemia.  Patient has developed normocytic anemia about 2 months ago.  Normal hemoglobin levels prior.  Her most recent drop from 10--->5.7 is most certainly due to blood losses from knee surgery few weeks ago.  No overt GI bleeding.  Iron studies favor iron deficiency.  Good response to transfusion.  She is Hemoccult positive. 2.  Heme positive stool.  No prior GI evaluations 3.  GERD.  On Pepcid  at home. 4.  History of mesenteric thrombosis.  On Coumadin .  Last dose taken 2 days ago 5.  History of CVA.  On Plavix .  Last dose taken 2 days ago. 6.  Multiple other medical problems as noted    PLAN:  1.  Pantoprazole  40 mg daily for upper GI mucosal protection 2.  Colonoscopy and upper endoscopy to evaluate iron deficiency anemia and Hemoccult positive stool in a patient with multiple comorbidities on both Plavix  and Coumadin .  She is HIGH RISK.  I discussed this in some detail with the patient  this morning.  She is reluctant wants to go home.  I told her that, in my opinion, it would be best to perform these examinations while she is in the hospital.  However, the examination would not be for several days, as adequate washout of both Coumadin  and Plavix  is necessary.  She would think about things overnight. 3.  Okay for carb modified diet at this time.  A total time of 85 minutes was spent preparing to see the patient, reviewing multiple outside records, encounters, surgeries, laboratories, and x-rays.  Obtaining comprehensive history, performing medically appropriate and extensive physical examination, counseling and educating the patient regarding the above listed issues, ordering medications, providing recommendations, reviewing endoscopic procedures, and documenting clinical information in the health record  Will follow  Keiko Myricks N. Willey Harrier., M.D. [facility] Division of Gastroenterology

## 2023-10-03 DIAGNOSIS — D649 Anemia, unspecified: Secondary | ICD-10-CM | POA: Diagnosis not present

## 2023-10-03 DIAGNOSIS — R195 Other fecal abnormalities: Secondary | ICD-10-CM

## 2023-10-03 DIAGNOSIS — Z7902 Long term (current) use of antithrombotics/antiplatelets: Secondary | ICD-10-CM

## 2023-10-03 DIAGNOSIS — D500 Iron deficiency anemia secondary to blood loss (chronic): Secondary | ICD-10-CM | POA: Diagnosis not present

## 2023-10-03 DIAGNOSIS — Z7901 Long term (current) use of anticoagulants: Secondary | ICD-10-CM | POA: Diagnosis not present

## 2023-10-03 LAB — GLUCOSE, CAPILLARY
Glucose-Capillary: 109 mg/dL — ABNORMAL HIGH (ref 70–99)
Glucose-Capillary: 88 mg/dL (ref 70–99)
Glucose-Capillary: 77 mg/dL (ref 70–99)
Glucose-Capillary: 79 mg/dL (ref 70–99)

## 2023-10-03 LAB — BASIC METABOLIC PANEL WITH GFR
Anion gap: 6 (ref 5–15)
BUN: 8 mg/dL (ref 8–23)
CO2: 25 mmol/L (ref 22–32)
Calcium: 8.1 mg/dL — ABNORMAL LOW (ref 8.9–10.3)
Chloride: 107 mmol/L (ref 98–111)
Creatinine, Ser: 0.74 mg/dL (ref 0.44–1.00)
GFR, Estimated: >60 mL/min (ref >60)
Glucose, Bld: 127 mg/dL — ABNORMAL HIGH (ref 70–99)
Potassium: 3.7 mmol/L (ref 3.5–5.1)
Sodium: 138 mmol/L (ref 135–145)

## 2023-10-03 LAB — HEMOGLOBIN AND HEMATOCRIT, BLOOD
HCT: 28.1 % — ABNORMAL LOW (ref 36.0–46.0)
Hemoglobin: 8.1 g/dL — ABNORMAL LOW (ref 12.0–15.0)

## 2023-10-03 MED ORDER — PREGABALIN 25 MG PO CAPS
25.0000 mg | ORAL_CAPSULE | Freq: Two times a day (BID) | ORAL | Status: DC
  Administered 2023-10-03 – 2023-10-06 (×5): 25 mg via ORAL
  Filled 2023-10-03 (×5): qty 1

## 2023-10-03 MED ORDER — EZETIMIBE 10 MG PO TABS
10.0000 mg | ORAL_TABLET | Freq: Every evening | ORAL | Status: DC
  Administered 2023-10-03 – 2023-10-05 (×3): 10 mg via ORAL
  Filled 2023-10-03 (×3): qty 1

## 2023-10-03 MED ORDER — ROPINIROLE HCL 1 MG PO TABS
3.0000 mg | ORAL_TABLET | Freq: Every day | ORAL | Status: DC
  Administered 2023-10-03 – 2023-10-05 (×3): 3 mg via ORAL
  Filled 2023-10-03 (×3): qty 3

## 2023-10-03 MED ORDER — ESCITALOPRAM OXALATE 20 MG PO TABS
20.0000 mg | ORAL_TABLET | Freq: Every evening | ORAL | Status: DC
  Administered 2023-10-03 – 2023-10-05 (×3): 20 mg via ORAL
  Filled 2023-10-03 (×3): qty 1

## 2023-10-03 MED ORDER — BACLOFEN 10 MG PO TABS
10.0000 mg | ORAL_TABLET | Freq: Three times a day (TID) | ORAL | Status: DC | PRN
  Administered 2023-10-03 – 2023-10-06 (×5): 10 mg via ORAL
  Filled 2023-10-03 (×5): qty 1

## 2023-10-03 MED ORDER — ROPINIROLE HCL 1 MG PO TABS: 3.0000 mg | ORAL_TABLET | Freq: Every day | ORAL | Status: DC

## 2023-10-03 NOTE — Progress Notes (Signed)
 [facility]  PROGRESS NOTE  [name] Good ZOX:[id number] DOB: [birth year] DOA: 10/01/2023 PCP: Rodney Clamp, MD   Brief HPI:   73 y.o. female with medical history significant of GERD, previous CVA, depression with anxiety, coronary artery disease, history of SMV thrombosis with chronic Coumadin  therapy, presumed antiphospholipid syndrome also on Coumadin  who presented to the ER with progressive shortness of breath and weakness.  Patient was seen by have primary care physician where hemoglobin was found to be 5.8.  Her previous hemoglobin was more than 10.  Patient is also still guaiac positive x 2.     Assessment/Plan:   #1 Symptomatic anemia:  Presented with shortness of breath and weakness, PCP found that hemoglobin was 5.8.  FOBT was positive.  .  IV Protonix , serial H&H, GI consulted - Plavix  and warfarin on hold -Status post 2 units PRBC, hemoglobin is up to 8.1 today.    #2 GI bleed: FOBT positive.   Patient has been on Plavix  and warfarin at home.   [facility] consulted.   Continue pantoprazole  40 mg IV every 12 hours. -Discussed with patient need for EGD and colonoscopy, considering that patient is on both warfarin and Plavix  and has FOBT positive with ongoing GI bleed and anemia requiring blood transfusions. -She is now willing to undergo EGD and colonoscopy. -This was conveyed to [facility] physician Dr. Elvin Hammer   #3 history of CVA:  Hold Plavix  due to GI bleed   #4 type 2 diabetes:  Continue sliding scale insulin  with NovoLog .   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   Holding warfarin due to bleed.        Medications     atorvastatin   80 mg Oral Daily   insulin  aspart  0-9 Units Subcutaneous TID WC   pantoprazole  (PROTONIX ) IV  40 mg Intravenous Q12H   rOPINIRole   3 mg Oral QHS     Data Reviewed:   CBG:  Recent Labs  Lab 10/02/23 1612 10/02/23 2136  10/03/23 0809 10/03/23 1221  GLUCAP 129* 121* 109* 88    SpO2: 100 %    Vitals:   10/02/23 1215 10/02/23 2127 10/03/23 0343 10/03/23 1221  BP: 123/65 (!) 109/51 135/76 (!) 128/57  Pulse: 88 93 94 97  Resp: (!) 22 18 18 20   Temp: 98.6 F (37 C) 98.6 F (37 C) 98.4 F (36.9 C) 98.8 F (37.1 C)  TempSrc: Oral Oral Oral Oral  SpO2: 97% 96% 100% 100%  Weight:      Height:          Data Reviewed:  Basic Metabolic Panel: Recent Labs  Lab 10/01/23 1750 10/02/23 0533 10/03/23 1037  NA 137 138 138  K 3.7 3.6 3.7  CL 107 110 107  CO2 24 23 25   GLUCOSE 93 103* 127*  BUN 13 9 8   CREATININE 0.82 0.67 0.74  CALCIUM  8.1* 7.8* 8.1*    CBC: Recent Labs  Lab 10/01/23 0747 10/02/23 0533 10/02/23 0918 10/02/23 1756 10/02/23 2130 10/03/23 0756  WBC 5.9 5.2 5.6 5.9 5.8  --   NEUTROABS 3.2  --   --   --   --   --   HGB 5.7* 7.0* 7.5* 9.0* 7.5* 8.1*  HCT 19.6* 23.9* 25.0* 30.8* 24.4* 28.1*  MCV 98.5 95.6 94.3 96.3 92.1  --   PLT 284 243 267 286 258  --     LFT  Recent Labs  Lab 10/01/23 1750 10/02/23 0533  AST 28 22  ALT 20 18  ALKPHOS 125 102  BILITOT 0.4 0.8  PROT 5.8* 4.9*  ALBUMIN 2.7* 2.2*     Antibiotics: Anti-infectives (From admission, onward)    None        DVT prophylaxis: SCDs  Code Status: Full code  Family Communication: No family at bedside   CONSULTS gastroenterology   Subjective   Denies any complaints.   Objective    Physical Examination:  General-appears in no acute distress Heart-S1-S2, regular, no murmur auscultated Lungs-clear to auscultation bilaterally, no wheezing or crackles auscultated Abdomen-soft, nontender, no organomegaly Extremities-no edema in the lower extremities Neuro-alert, oriented x3, no focal deficit noted  Status is: Inpatient:             [name] Good   [facility] If 7PM-7AM, please contact night-coverage at [email or web address], Office  [phone]   10/03/2023, 2:18 PM   LOS: 2 days

## 2023-10-03 NOTE — Progress Notes (Signed)
 Mobility Specialist - Progress Note   10/03/23 0929  Mobility  Activity Ambulated with assistance in hallway  Level of Assistance Contact guard assist, steadying assist  Assistive Device Front wheel walker  Distance Ambulated (ft) 100 ft  Range of Motion/Exercises Active  Activity Response Tolerated well  Mobility Referral Yes  Mobility visit 1 Mobility  Mobility Specialist Start Time (ACUTE ONLY) 0910  Mobility Specialist Stop Time (ACUTE ONLY) 0925  Mobility Specialist Time Calculation (min) (ACUTE ONLY) 15 min   Pt was found in bed and agreeable to ambulate. No complaints with session and returned to bed with all needs met. Call bell in reach.  Lorna Rose,  Mobility Specialist Can be reached via Secure Chat

## 2023-10-03 NOTE — Progress Notes (Signed)
 Expand All Collapse All  HISTORY OF PRESENT ILLNESS:   [name] Good is a 73 y.o. 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   Admitted with symptomatic iron deficiency anemia.  Please see yesterday's complete comprehensive consultation note.  No issues overnight.  Ambulating with walker.  Enjoyed regular diet.  No bleeding.  Now states she is agreeable to endoscopic procedures to evaluate her iron deficiency anemia.   LABORATORIES: Hemoglobin 8.1 (stable) Basic metabolic panel normal except glucose 127   REVIEW OF SYSTEMS:   All non-GI ROS negative except for knee pain and anxiety       Past Medical History:  Diagnosis Date   Anxiety     Arthritis     Depression     GERD (gastroesophageal reflux disease)     Myocardial infarction (HCC) 89    no visits to cardiac dr(thomas kelly) since 92   Stroke (HCC)     Wound dehiscence      lumbar               Past Surgical History:  Procedure Laterality Date   APPENDECTOMY        17 yrs   BACK SURGERY       BREAST SURGERY Left      cyst   BUBBLE STUDY   03/12/2020    Procedure: BUBBLE STUDY;  Surgeon: Pasqual Bone, MD;  Location: [facility] ENDOSCOPY;  Service: Cardiovascular;;   CARDIAC CATHETERIZATION   35   CHOLECYSTECTOMY        73 yrs old   LUMBAR LAMINECTOMY/DECOMPRESSION MICRODISCECTOMY N/A 06/14/2015    Procedure: Lumbar Three-Four,Lumbar Four-Five, Lumbar Five-Sacral One Laminectomy;  Surgeon: Isadora Mar, MD;  Location: [facility]URO [facility];  Service: Neurosurgery;  Laterality: N/A;   LUMBAR WOUND DEBRIDEMENT N/A 07/24/2015    Procedure: lumbar wound revision;  Surgeon: Isadora Mar, MD;  Location: [facility];  Service:  Neurosurgery;  Laterality: N/A;   TEE WITHOUT CARDIOVERSION N/A 03/12/2020    Procedure: TRANSESOPHAGEAL ECHOCARDIOGRAM (TEE);  Surgeon: Pasqual Bone, MD;  Location[facility]COPY;  Service: Cardiovascular;  Laterality: N/A;   TOTAL HIP ARTHROPLASTY Right 10/25/2015    Procedure: RIGHT TOTAL HIP ARTHROPLASTY ANTERIOR APPROACH;  Surgeon: Arnie Lao, MD;  Location: [facility] ORS;  Service: Orthopedics;  Laterality: Right;   TOTAL KNEE ARTHROPLASTY Left 08/31/2023    Procedure: ARTHROPLASTY, KNEE, TOTAL;  Surgeon: Arnie Lao, MD;  Location: [facility] OR;  Service: Orthopedics;  Laterality: Left;   TUBAL LIGATION              Social History DAVY FAUGHT  reports that she quit smoking about 10 years ago. Her smoking use included cigarettes. She started smoking about 50 years ago. She has a 80 pack-year smoking history. She has never used smokeless tobacco. She reports that she does not currently use alcohol. She reports that she does not use drugs.   family history includes Stroke in her mother.   Allergies       Allergies  Allergen Reactions   Amoxicillin Other (See Comments)      Tolerated Zosyn  Has patient had a PCN reaction causing immediate rash, facial/tongue/throat swelling, SOB or lightheadedness with hypotension: No Has patient had a PCN reaction causing severe rash involving  mucus membranes or skin necrosis: No Has patient had a PCN reaction that required hospitalization No Has patient had a PCN reaction occurring within the last 10 years: No If all of the above answers are "NO", then may proceed with Cephalosporin use.            PHYSICAL EXAMINATION: Vital signs: BP 123/65 (BP Location: Right Arm)   Pulse 88   Temp 98.6 F (37 C) (Oral)   Resp (!) 22   Ht 5\' 3"  (1.6 m)   Wt 84 kg   SpO2 97%   BMI 32.80 kg/m   Constitutional: Somewhat anxious, unhealthy appearing, no acute distress Psychiatric: alert and oriented x3, cooperative.  A bit anxious Eyes:  extraocular movements intact, anicteric, conjunctiva pink Mouth: oral pharynx moist, no lesions Neck: supple no lymphadenopathy Cardiovascular: heart regular rate and rhythm, no murmur Lungs: clear to auscultation bilaterally Abdomen: soft, obese, nontender, nondistended, no obvious ascites, no peritoneal signs, normal bowel sounds, no organomegaly Rectal: Omitted Extremities: no lower extremity edema bilaterally Skin: no relevant lesions on visible extremities Neuro: No focal deficits.  Cranial nerves intact   ASSESSMENT:   1.  Acute on chronic anemia.  Patient has developed normocytic anemia about 2 months ago.  Normal hemoglobin levels prior.  Her most recent drop from 10--->5.7 is most certainly due to blood losses from knee surgery few weeks ago.  Still with no overt GI bleeding.  Iron studies favor iron deficiency.  Good response to transfusion.  Current hemoglobin 8.1.  She is Hemoccult positive. 2.  Heme positive stool.  No prior GI evaluations 3.  GERD.  On Pepcid  at home. 4.  History of mesenteric thrombosis.  On Coumadin .  Last dose taken 2 days ago 5.  History of CVA.  On Plavix .  Last dose taken 2 days ago. 6.  Multiple other medical problems as noted       PLAN:   1.  Continue pantoprazole  40 mg daily for upper GI mucosal protection 2.  Colonoscopy and upper endoscopy to evaluate iron deficiency anemia and Hemoccult positive stool in a patient with multiple comorbidities on both Plavix  and Coumadin .  She is HIGH RISK.  I discussed this in some detail with the patient this morning.  She is now willing to undergo these examinations.The nature of the procedure, as well as the risks, benefits, and alternatives were carefully and thoroughly reviewed with the patient. Ample time for discussion and questions allowed. The patient understood, was satisfied, and agreed to proceed..  Will plan on prepping tomorrow for procedures on Tuesday after Plavix  washout.  Dr. Karene Oto and PA  Alverta Johns to take over the service tomorrow.  Patient aware 3.  Okay to continue carb modified diet at this time.

## 2023-10-04 DIAGNOSIS — D500 Iron deficiency anemia secondary to blood loss (chronic): Secondary | ICD-10-CM | POA: Diagnosis not present

## 2023-10-04 DIAGNOSIS — D509 Iron deficiency anemia, unspecified: Secondary | ICD-10-CM

## 2023-10-04 DIAGNOSIS — D649 Anemia, unspecified: Secondary | ICD-10-CM | POA: Diagnosis not present

## 2023-10-04 DIAGNOSIS — Z7902 Long term (current) use of antithrombotics/antiplatelets: Secondary | ICD-10-CM | POA: Diagnosis not present

## 2023-10-04 DIAGNOSIS — Z86718 Personal history of other venous thrombosis and embolism: Secondary | ICD-10-CM

## 2023-10-04 DIAGNOSIS — Z7901 Long term (current) use of anticoagulants: Secondary | ICD-10-CM | POA: Diagnosis not present

## 2023-10-04 LAB — COMPREHENSIVE METABOLIC PANEL WITH GFR
ALT: 16 U/L (ref 0–44)
AST: 28 U/L (ref 15–41)
Albumin: 2.3 g/dL — ABNORMAL LOW (ref 3.5–5.0)
Alkaline Phosphatase: 102 U/L (ref 38–126)
Anion gap: 5 (ref 5–15)
BUN: 10 mg/dL (ref 8–23)
CO2: 26 mmol/L (ref 22–32)
Calcium: 7.9 mg/dL — ABNORMAL LOW (ref 8.9–10.3)
Chloride: 105 mmol/L (ref 98–111)
Creatinine, Ser: 0.79 mg/dL (ref 0.44–1.00)
GFR, Estimated: >60 mL/min (ref >60)
Glucose, Bld: 102 mg/dL — ABNORMAL HIGH (ref 70–99)
Potassium: 4.1 mmol/L (ref 3.5–5.1)
Sodium: 136 mmol/L (ref 135–145)
Total Bilirubin: 0.8 mg/dL (ref 0.0–1.2)
Total Protein: 5.3 g/dL — ABNORMAL LOW (ref 6.5–8.1)

## 2023-10-04 LAB — CBC
HCT: 27.2 % — ABNORMAL LOW (ref 36.0–46.0)
Hemoglobin: 8 g/dL — ABNORMAL LOW (ref 12.0–15.0)
MCH: 27.5 pg (ref 26.0–34.0)
MCHC: 29.4 g/dL — ABNORMAL LOW (ref 30.0–36.0)
MCV: 93.5 fL (ref 80.0–100.0)
Platelets: 254 10*3/uL (ref 150–400)
RBC: 2.91 MIL/uL — ABNORMAL LOW (ref 3.87–5.11)
RDW: 16.8 % — ABNORMAL HIGH (ref 11.5–15.5)
WBC: 5.2 10*3/uL (ref 4.0–10.5)
nRBC: 0 % (ref 0.0–0.2)

## 2023-10-04 LAB — GLUCOSE, CAPILLARY
Glucose-Capillary: 102 mg/dL — ABNORMAL HIGH (ref 70–99)
Glucose-Capillary: 102 mg/dL — ABNORMAL HIGH (ref 70–99)
Glucose-Capillary: 90 mg/dL (ref 70–99)
Glucose-Capillary: 109 mg/dL — ABNORMAL HIGH (ref 70–99)

## 2023-10-04 MED ORDER — TIZANIDINE HCL 4 MG PO TABS
2.0000 mg | ORAL_TABLET | Freq: Once | ORAL | Status: AC
  Administered 2023-10-04: 2 mg via ORAL
  Filled 2023-10-04: qty 1

## 2023-10-04 MED ORDER — NA SULFATE-K SULFATE-MG SULF 17.5-3.13-1.6 GM/177ML PO SOLN: 0.5000 | Freq: Once | ORAL | Status: DC

## 2023-10-04 MED ORDER — ORAL CARE MOUTH RINSE: 15.0000 mL | OROMUCOSAL | Status: DC | PRN

## 2023-10-04 MED ORDER — TIZANIDINE HCL 4 MG PO TABS
2.0000 mg | ORAL_TABLET | Freq: Once | ORAL | Status: AC
  Administered 2023-10-05: 2 mg via ORAL
  Filled 2023-10-04: qty 1

## 2023-10-04 MED ORDER — BISACODYL 5 MG PO TBEC
10.0000 mg | DELAYED_RELEASE_TABLET | Freq: Once | ORAL | Status: AC
  Administered 2023-10-04: 10 mg via ORAL
  Filled 2023-10-04 (×2): qty 2

## 2023-10-04 MED ORDER — SODIUM CHLORIDE 0.9 % IV SOLN: INTRAVENOUS | Status: AC

## 2023-10-04 MED ORDER — NA SULFATE-K SULFATE-MG SULF 17.5-3.13-1.6 GM/177ML PO SOLN
0.5000 | Freq: Once | ORAL | Status: AC
  Administered 2023-10-04: 177 mL via ORAL
  Filled 2023-10-04: qty 1

## 2023-10-04 NOTE — Progress Notes (Signed)
   10/04/23 1015  TOC Brief Assessment  Insurance and Status Reviewed  Patient has primary care physician Yes  Home environment has been reviewed home w/ spouse  Prior level of function: independent  Prior/Current Home Services No current home services  Social Drivers of Health Review SDOH reviewed no interventions necessary  Readmission risk has been reviewed Yes  Transition of care needs no transition of care needs at this time

## 2023-10-04 NOTE — Anesthesia Preprocedure Evaluation (Addendum)
 Anesthesia Evaluation  Patient identified by MRN, date of birth, ID band Patient awake    Reviewed: Allergy & Precautions, NPO status , Patient's Chart, lab work & pertinent test results  History of Anesthesia Complications Negative for: history of anesthetic complications  Airway Mallampati: II  TM Distance: >3 FB Neck ROM: Full    Dental no notable dental hx. 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 )  Normal cardiovascular exam Rhythm:Regular Rate:Normal     Neuro/Psych  PSYCHIATRIC DISORDERS Anxiety Depression    CVA    GI/Hepatic ,GERD  Medicated and Controlled,,  Endo/Other  diabetes    Renal/GU      Musculoskeletal  (+) Arthritis , Osteoarthritis,    Abdominal   Peds  Hematology  (+) Blood dyscrasia, anemia Lab Results      Component                Value               Date                      WBC                      5.2                 10/04/2023                HGB                      8.0 (L)             10/04/2023                HCT                      27.2 (L)            10/04/2023                MCV                      93.5                10/04/2023                PLT                      254                 10/04/2023              Anesthesia Other Findings All: amoxicillin  Reproductive/Obstetrics                             Anesthesia Physical Anesthesia Plan  ASA: 3  Anesthesia Plan: MAC   Post-op Pain Management: Precedex   Induction: Intravenous  PONV Risk Score and Plan: Treatment may vary due to age or medical condition, Propofol  infusion and Midazolam   Airway Management Planned: Natural Airway and Nasal Cannula  Additional Equipment: None  Intra-op Plan:   Post-operative Plan:    Informed Consent: I have reviewed the patients History and Physical, chart, labs and  discussed the procedure including the risks, benefits and alternatives for the proposed anesthesia with the patient or authorized representative who has indicated his/her understanding and acceptance.     Dental advisory given  Plan Discussed with: CRNA and Surgeon  Anesthesia Plan Comments: (Symptomatic iron deficiency anemia)       Anesthesia Quick Evaluation

## 2023-10-04 NOTE — Progress Notes (Signed)
 Mobility Specialist - Progress Note   10/04/23 1015  Mobility  Activity Transferred to/from [facility];Ambulated with assistance in hallway  Level of Assistance Contact guard assist, steadying assist  Assistive Device Front wheel walker;BSC  Distance Ambulated (ft) 120 ft  Range of Motion/Exercises Active  Activity Response Tolerated well  Mobility Referral Yes  Mobility visit 1 Mobility  Mobility Specialist Start Time (ACUTE ONLY) 1015  Mobility Specialist Stop Time (ACUTE ONLY) 1040  Mobility Specialist Time Calculation (min) (ACUTE ONLY) 25 min   Pt was found in bed and agreeable to ambulate. C/o L knee pain during session. At EOS returned to sit EOB with all needs met. Call bell in reach and bed alarm on.   Lorna Rose,  Mobility Specialist Can be reached via Secure Chat

## 2023-10-04 NOTE — Progress Notes (Signed)
 [facility]  PROGRESS NOTE  [name] Good NGE:[id number] DOB: 09/13/1950 DOA: 10/01/2023 PCP: Rodney Clamp, MD   Brief HPI:   73 y.o. female with medical history significant of GERD, previous CVA, depression with anxiety, coronary artery disease, history of SMV thrombosis with chronic Coumadin  therapy, presumed antiphospholipid syndrome also on Coumadin  who presented to the ER with progressive shortness of breath and weakness.  Patient was seen by have primary care physician where hemoglobin was found to be 5.8.  Her previous hemoglobin was more than 10.  Patient is also still guaiac positive x 2.     Assessment/Plan:   #1 Symptomatic anemia:  Presented with shortness of breath and weakness, PCP found that hemoglobin was 5.8.  FOBT was positive.  .  IV Protonix , serial H&H, GI consulted - Plavix  and warfarin on hold -Status post 2 units PRBC, hemoglobin is up to 8.0 today.    #2 GI bleed: FOBT positive.   Patient has been on Plavix  and warfarin at home.   [facility] consulted.   Continue pantoprazole  40 mg IV every 12 hours. -Discussed with patient need for EGD and colonoscopy, considering that patient is on both warfarin and Plavix  and has FOBT positive with ongoing GI bleed and anemia requiring blood transfusions. -She is now willing to undergo EGD and colonoscopy. - Plan for EGD and colonoscopy on 10/05/2023   #3 history of CVA:  Hold Plavix  due to GI bleed   #4 type 2 diabetes:  Continue sliding scale insulin  with NovoLog .   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   Holding warfarin due to bleed.        Medications     atorvastatin   80 mg Oral Daily   escitalopram   20 mg Oral QPM   ezetimibe   10 mg Oral QPM   insulin  aspart  0-9 Units Subcutaneous TID WC   pantoprazole  (PROTONIX ) IV  40 mg Intravenous Q12H   pregabalin   25 mg Oral BID   rOPINIRole   3 mg Oral QHS      Data Reviewed:   CBG:  Recent Labs  Lab 10/02/23 2136 10/03/23 0809 10/03/23 1221 10/03/23 1639 10/03/23 2059  GLUCAP 121* 109* 88 77 79    SpO2: 99 %    Vitals:   10/03/23 0343 10/03/23 1221 10/03/23 2001 10/04/23 0632  BP: 135/76 (!) 128/57 136/65 (!) 129/53  Pulse: 94 97 92 84  Resp: 18 20 19 18   Temp: 98.4 F (36.9 C) 98.8 F (37.1 C) 98.4 F (36.9 C) 98.5 F (36.9 C)  TempSrc: Oral Oral Oral   SpO2: 100% 100% 98% 99%  Weight:      Height:          Data Reviewed:  Basic Metabolic Panel: Recent Labs  Lab 10/01/23 1750 10/02/23 0533 10/03/23 1037  NA 137 138 138  K 3.7 3.6 3.7  CL 107 110 107  CO2 24 23 25   GLUCOSE 93 103* 127*  BUN 13 9 8   CREATININE 0.82 0.67 0.74  CALCIUM  8.1* 7.8* 8.1*    CBC: Recent Labs  Lab 10/01/23 0747 10/02/23 0533 10/02/23 0918 10/02/23 1756 10/02/23 2130 10/03/23 0756 10/04/23 0725  WBC 5.9 5.2 5.6 5.9 5.8  --  5.2  NEUTROABS 3.2  --   --   --   --   --   --   HGB 5.7* 7.0* 7.5* 9.0* 7.5* 8.1* 8.0*  HCT 19.6* 23.9* 25.0* 30.8* 24.4* 28.1* 27.2*  MCV 98.5 95.6 94.3 96.3 92.1  --  93.5  PLT 284 243 267 286 258  --  254    LFT Recent Labs  Lab 10/01/23 1750 10/02/23 0533  AST 28 22  ALT 20 18  ALKPHOS 125 102  BILITOT 0.4 0.8  PROT 5.8* 4.9*  ALBUMIN 2.7* 2.2*     Antibiotics: Anti-infectives (From admission, onward)    None        DVT prophylaxis: SCDs  Code Status: Full code  Family Communication: No family at bedside   CONSULTS gastroenterology   Subjective   Denies abdominal pain.  No nausea or vomiting.   Objective    Physical Examination:  General-appears in no acute distress Heart-S1-S2, regular, no murmur auscultated Lungs-clear to auscultation bilaterally, no wheezing or crackles auscultated Abdomen-soft, nontender, no organomegaly Extremities-no edema in the lower extremities Neuro-alert, oriented x3, no focal deficit noted  Status is: Inpatient:              [name] Good   [facility] If 7PM-7AM, please contact night-coverage at [email or web address], Office  [phone]   10/04/2023, 7:58 AM  LOS: 3 days

## 2023-10-04 NOTE — Progress Notes (Addendum)
 Attending physician's note   I have taken a history, reviewed the chart, and examined the patient. I performed a substantive portion of this encounter, including complete performance of at least one of the key components, in conjunction with the APP. I agree with the APP's note, impression, and recommendations with my edits.   Will complete Coumadin  and Plavix  washout tomorrow with plan to proceed with EGD/colonoscopy tomorrow for diagnostic and potentially therapeutic intent.  Good response to 2 unit RBC transfusion earlier on this admission and H/H currently stable at 8/27.  No overt bleeding.  No prior EGD or colonoscopy. - Hold Plavix /Coumadin  - Continue pantoprazole  - CLD today with n.p.o. at midnight - Prep this evening with plan for EGD/colonoscopy tomorrow  The indications, risks, and benefits of EGD and colonoscopy were explained to the patient in detail. Risks include but are not limited to bleeding, perforation, adverse reaction to medications, and cardiopulmonary compromise. Sequelae include but are not limited to the possibility of surgery, hospitalization, and mortality. The patient verbalized understanding and wished to proceed. All questions answered.    [name] Gendron, DO, FACG [phone] office          Progress Note   Subjective  Chief Complaint: Symptomatic iron deficiency anemia with Hemoccult positive stool  Patient here with multiple significant medical problems on Plavix  for stroke and Coumadin  for mesenteric thrombosis.  Both are on hold with plans for EGD colonoscopy tomorrow 10/05/2023.  Patient reports doing well overnight, she is tired but aware that she will need to do bowel prep today and be on a clear liquid diet.  I took time to answer her questions.  No new symptoms or concerns.   Objective   Vital signs in last 24 hours: Temp:  [98.4 F (36.9 C)-98.8 F (37.1 C)] 98.5 F (36.9 C) (06/09 8295) Pulse Rate:  [84-97] 84 (06/09 0632) Resp:   [18-20] 18 (06/09 6213) BP: (128-136)/(53-65) 129/53 (06/09 0865) SpO2:  [98 %-100 %] 99 % (06/09 7846) Last BM Date : 10/02/23 General:   elderly white female in NAD Heart:  Regular rate and rhythm; no murmurs Lungs: Respirations even and unlabored, lungs CTA bilaterally Abdomen:  Soft, nontender and nondistended. Normal bowel sounds. Psych:  Cooperative. Normal mood and affect.  Intake/Output from previous day: 06/08 0701 - 06/09 0700 In: 120 [P.O.:120] Out: 1900 [Urine:1900]  Lab Results: Recent Labs    10/02/23 1756 10/02/23 2130 10/03/23 0756 10/04/23 0725  WBC 5.9 5.8  --  5.2  HGB 9.0* 7.5* 8.1* 8.0*  HCT 30.8* 24.4* 28.1* 27.2*  PLT 286 258  --  254   BMET Recent Labs    10/02/23 0533 10/03/23 1037 10/04/23 0725  NA 138 138 136  K 3.6 3.7 4.1  CL 110 107 105  CO2 23 25 26   GLUCOSE 103* 127* 102*  BUN 9 8 10   CREATININE 0.67 0.74 0.79  CALCIUM  7.8* 8.1* 7.9*   LFT Recent Labs    10/04/23 0725  PROT 5.3*  ALBUMIN 2.3*  AST 28  ALT 16  ALKPHOS 102  BILITOT 0.8    Assessment / Plan:   Assessment: 1.  Acute on chronic anemia: Patient developed normocytic anemia about 2 months ago, normal hemoglobin levels prior, recent drop from 10--> 5.7 is thought related to recent knee surgery a few weeks ago with no overt GI bleeding, iron studies favor iron deficiency, good response to transfusion, hemoglobin stable overnight but she is Hemoccult positive; consider GI source  as well 2.  Heme positive stool: No prior GI evaluations 3.  GERD: On Pepcid  at home 4.  History of mesenteric thrombosis: On Coumadin , last dose taken 3 days ago 5.  History of CVA on Plavix : Last dose taken 3 days ago 6.  Multiple other medical problems  Plan: 1.  Continue Pantoprazole  40 mg daily for upper GI mucosal protection 2.  EGD and colonoscopy tomorrow with Dr. Karene Oto. 3.  Continue to hold Plavix  and Coumadin  4.  Patient be on a clear liquid diet today and n.p.o. at  midnight 5.  Will start Suprep in split dose fashion 6.  Continue to monitor hemoglobin and transfusion as needed 7.  Will add on INR for tomorrow  Thank you for your kind consultation, we will continue to follow    LOS: 3 days   Graciella [name] Good  10/04/2023, 9:26 AM

## 2023-10-05 ENCOUNTER — Inpatient Hospital Stay (HOSPITAL_COMMUNITY): Admitting: Certified Registered"

## 2023-10-05 ENCOUNTER — Other Ambulatory Visit: Payer: Self-pay

## 2023-10-05 ENCOUNTER — Encounter (HOSPITAL_COMMUNITY)
Admission: EM | Disposition: A | Discharge status: Home / Self Care | Payer: Self-pay | Source: Home / Self Care | Attending: Family Medicine

## 2023-10-05 ENCOUNTER — Encounter (HOSPITAL_COMMUNITY): Payer: Self-pay | Admitting: Internal Medicine

## 2023-10-05 ENCOUNTER — Telehealth: Payer: Self-pay

## 2023-10-05 ENCOUNTER — Ambulatory Visit

## 2023-10-05 DIAGNOSIS — K31819 Angiodysplasia of stomach and duodenum without bleeding: Secondary | ICD-10-CM | POA: Diagnosis not present

## 2023-10-05 DIAGNOSIS — I679 Cerebrovascular disease, unspecified: Secondary | ICD-10-CM

## 2023-10-05 DIAGNOSIS — K2950 Unspecified chronic gastritis without bleeding: Secondary | ICD-10-CM

## 2023-10-05 DIAGNOSIS — D500 Iron deficiency anemia secondary to blood loss (chronic): Secondary | ICD-10-CM | POA: Diagnosis not present

## 2023-10-05 DIAGNOSIS — D649 Anemia, unspecified: Secondary | ICD-10-CM | POA: Diagnosis not present

## 2023-10-05 DIAGNOSIS — K2970 Gastritis, unspecified, without bleeding: Secondary | ICD-10-CM

## 2023-10-05 DIAGNOSIS — Z7902 Long term (current) use of antithrombotics/antiplatelets: Secondary | ICD-10-CM | POA: Diagnosis not present

## 2023-10-05 DIAGNOSIS — I252 Old myocardial infarction: Secondary | ICD-10-CM

## 2023-10-05 DIAGNOSIS — K5520 Angiodysplasia of colon without hemorrhage: Secondary | ICD-10-CM

## 2023-10-05 DIAGNOSIS — Z7901 Long term (current) use of anticoagulants: Secondary | ICD-10-CM | POA: Diagnosis not present

## 2023-10-05 DIAGNOSIS — K5710 Diverticulosis of small intestine without perforation or abscess without bleeding: Secondary | ICD-10-CM

## 2023-10-05 DIAGNOSIS — F418 Other specified anxiety disorders: Secondary | ICD-10-CM

## 2023-10-05 DIAGNOSIS — D509 Iron deficiency anemia, unspecified: Secondary | ICD-10-CM

## 2023-10-05 LAB — PROTIME-INR
INR: 1.2 (ref 0.8–1.2)
Prothrombin Time: 15.6 s — ABNORMAL HIGH (ref 11.4–15.2)

## 2023-10-05 LAB — CBC
HCT: 27.4 % — ABNORMAL LOW (ref 36.0–46.0)
Hemoglobin: 8.2 g/dL — ABNORMAL LOW (ref 12.0–15.0)
MCH: 27.9 pg (ref 26.0–34.0)
MCHC: 29.9 g/dL — ABNORMAL LOW (ref 30.0–36.0)
MCV: 93.2 fL (ref 80.0–100.0)
Platelets: 263 10*3/uL (ref 150–400)
RBC: 2.94 MIL/uL — ABNORMAL LOW (ref 3.87–5.11)
RDW: 16.6 % — ABNORMAL HIGH (ref 11.5–15.5)
WBC: 6.3 10*3/uL (ref 4.0–10.5)
nRBC: 0 % (ref 0.0–0.2)

## 2023-10-05 LAB — COMPREHENSIVE METABOLIC PANEL WITH GFR
ALT: 15 U/L (ref 0–44)
AST: 21 U/L (ref 15–41)
Albumin: 2.4 g/dL — ABNORMAL LOW (ref 3.5–5.0)
Alkaline Phosphatase: 107 U/L (ref 38–126)
Anion gap: 6 (ref 5–15)
BUN: 10 mg/dL (ref 8–23)
CO2: 26 mmol/L (ref 22–32)
Calcium: 8 mg/dL — ABNORMAL LOW (ref 8.9–10.3)
Chloride: 104 mmol/L (ref 98–111)
Creatinine, Ser: 1.04 mg/dL — ABNORMAL HIGH (ref 0.44–1.00)
GFR, Estimated: 56 mL/min — ABNORMAL LOW (ref >60)
Glucose, Bld: 99 mg/dL (ref 70–99)
Potassium: 3.8 mmol/L (ref 3.5–5.1)
Sodium: 136 mmol/L (ref 135–145)
Total Bilirubin: 0.5 mg/dL (ref 0.0–1.2)
Total Protein: 5.2 g/dL — ABNORMAL LOW (ref 6.5–8.1)

## 2023-10-05 LAB — GLUCOSE, CAPILLARY
Glucose-Capillary: 82 mg/dL (ref 70–99)
Glucose-Capillary: 66 mg/dL — ABNORMAL LOW (ref 70–99)
Glucose-Capillary: 105 mg/dL — ABNORMAL HIGH (ref 70–99)
Glucose-Capillary: 88 mg/dL (ref 70–99)
Glucose-Capillary: 97 mg/dL (ref 70–99)
Glucose-Capillary: 92 mg/dL (ref 70–99)

## 2023-10-05 SURGERY — EGD (ESOPHAGOGASTRODUODENOSCOPY)
Anesthesia: Monitor Anesthesia Care

## 2023-10-05 MED ORDER — DEXTROSE 50 % IV SOLN
12.5000 g | INTRAVENOUS | Status: AC
  Administered 2023-10-05: 12.5 g via INTRAVENOUS
  Filled 2023-10-05: qty 50

## 2023-10-05 MED ORDER — PROPOFOL 10 MG/ML IV BOLUS
INTRAVENOUS | Status: AC
  Filled 2023-10-05: qty 20

## 2023-10-05 MED ORDER — PROPOFOL 500 MG/50ML IV EMUL
INTRAVENOUS | Status: DC | PRN
  Administered 2023-10-05: 125 ug/kg/min via INTRAVENOUS

## 2023-10-05 MED ORDER — PROPOFOL 500 MG/50ML IV EMUL
INTRAVENOUS | Status: AC
  Filled 2023-10-05: qty 50

## 2023-10-05 MED ORDER — DEXMEDETOMIDINE HCL IN NACL 80 MCG/20ML IV SOLN
INTRAVENOUS | Status: DC | PRN
  Administered 2023-10-05 (×2): 4 ug via INTRAVENOUS

## 2023-10-05 MED ORDER — SODIUM CHLORIDE 0.9 % IV SOLN
INTRAVENOUS | Status: DC | PRN
Start: 2023-10-05 — End: 2023-10-05

## 2023-10-05 MED ORDER — PHENYLEPHRINE HCL-NACL 20-0.9 MG/250ML-% IV SOLN
INTRAVENOUS | Status: DC | PRN
Start: 2023-10-05 — End: 2023-10-05
  Administered 2023-10-05: 40 ug/min via INTRAVENOUS

## 2023-10-05 MED ORDER — PHENYLEPHRINE 80 MCG/ML (10ML) SYRINGE FOR IV PUSH (FOR BLOOD PRESSURE SUPPORT)
PREFILLED_SYRINGE | INTRAVENOUS | Status: DC | PRN
  Administered 2023-10-05 (×2): 80 ug via INTRAVENOUS
  Administered 2023-10-05 (×3): 120 ug via INTRAVENOUS

## 2023-10-05 MED ORDER — LACTATED RINGERS IV SOLN
INTRAVENOUS | Status: AC | PRN
  Administered 2023-10-05: 20 mL/h via INTRAVENOUS

## 2023-10-05 MED ORDER — DEXMEDETOMIDINE HCL IN NACL 80 MCG/20ML IV SOLN
INTRAVENOUS | Status: AC
  Filled 2023-10-05: qty 20

## 2023-10-05 MED ORDER — PROPOFOL 500 MG/50ML IV EMUL
INTRAVENOUS | Status: AC
Start: 2023-10-05 — End: ?
  Filled 2023-10-05: qty 50

## 2023-10-05 MED ORDER — PROPOFOL 10 MG/ML IV BOLUS
INTRAVENOUS | Status: DC | PRN
  Administered 2023-10-05 (×5): 20 mg via INTRAVENOUS

## 2023-10-05 MED ORDER — PROPOFOL 1000 MG/100ML IV EMUL
INTRAVENOUS | Status: AC
  Filled 2023-10-05: qty 100

## 2023-10-05 MED ORDER — LIDOCAINE 2% (20 MG/ML) 5 ML SYRINGE
INTRAMUSCULAR | Status: DC | PRN
  Administered 2023-10-05: 100 mg via INTRAVENOUS

## 2023-10-05 NOTE — Progress Notes (Signed)
 Hypoglycemic Event  CBG: 66 @ 1118  Treatment: D50 25 mL (12.5 gm)  Symptoms: None  Follow-up CBG: Time: 1149 CBG Result: 105  Possible Reasons for Event: Pt NPO for procedure and inadequate meal intake yesterday   Comments/MD notified: Alfonse Angle, MD and Endo RN     Arlyce Lambert

## 2023-10-05 NOTE — Transfer of Care (Signed)
 Immediate Anesthesia Transfer of Care Note  Patient: [name] Good  Procedure(s) Performed: EGD (ESOPHAGOGASTRODUODENOSCOPY) EGD, WITH ARGON PLASMA COAGULATION  Patient Location: PACU  Anesthesia Type:MAC  Level of Consciousness: drowsy and patient cooperative  Airway & Oxygen Therapy: Patient Spontanous Breathing and Patient connected to face mask oxygen  Post-op Assessment: Report given to RN and Post -op Vital signs reviewed and stable  Post vital signs: Reviewed, titrating neosynephrine and fluid bolus for hypotention Last Vitals:  Vitals Value Taken Time  BP 73/28 10/05/23 1342  Temp    Pulse 70 10/05/23 1345  Resp 17 10/05/23 1345  SpO2 100 % 10/05/23 1345  Vitals shown include unfiled device data.  Last Pain:  Vitals:   10/05/23 1251  TempSrc: Temporal  PainSc: 7       Patients Stated Pain Goal: 3 (10/05/23 1610)  Complications: No notable events documented.

## 2023-10-05 NOTE — Progress Notes (Signed)
 [facility]  PROGRESS NOTE  [name] Good ZOX:[id number] DOB: [birth year] DOA: 10/01/2023 PCP: Rodney Clamp, MD   Brief HPI:   73 y.o. female with medical history significant of GERD, previous CVA, depression with anxiety, coronary artery disease, history of SMV thrombosis with chronic Coumadin  therapy, presumed antiphospholipid syndrome also on Coumadin  who presented to the ER with progressive shortness of breath and weakness.  Patient was seen by have primary care physician where hemoglobin was found to be 5.8.  Her previous hemoglobin was more than 10.  Patient is also still guaiac positive x 2.     Assessment/Plan:   #1 Symptomatic anemia:  Presented with shortness of breath and weakness,  PCP found that hemoglobin was 5.8.  FOBT was positive.  .   IV Protonix , serial H&H, GI consulted - Plavix  and warfarin on hold -Status post 2 units PRBC, hemoglobin is up to 8.2 today.    #2 GI bleed:  FOBT positive.   Patient has been on Plavix  and warfarin at home.   [facility] consulted.   Started on pantoprazole  40 mg IV every 12 hours. -Did not drink bowel prep, plan for only EGD.  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 Mild gastritis (biopsy) but no gastric ulcers. Patient declined colonoscopy again today and has no intention of completing  - Restart Plavix  and warfarin after 3 days of holding, on 10/09/2023.   #3 history of CVA:  Hold Plavix  due to GI bleed as above -Can start on 10/09/2023   #4 type 2 diabetes:  Continue sliding scale insulin  with NovoLog .   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   Holding warfarin due to bleed.   -Can restart warfarin on 10/09/2023      Medications     atorvastatin   80 mg Oral Daily   escitalopram   20 mg Oral QPM   ezetimibe   10 mg Oral QPM   insulin  aspart  0-9 Units Subcutaneous TID WC    Na Sulfate-K Sulfate-Mg Sulfate concentrate  0.5 kit Oral Once   pantoprazole  (PROTONIX ) IV  40 mg Intravenous Q12H   pregabalin   25 mg Oral BID   rOPINIRole   3 mg Oral QHS     Data Reviewed:   CBG:  Recent Labs  Lab 10/04/23 0823 10/04/23 1206 10/04/23 1629 10/04/23 2105 10/05/23 0737  GLUCAP 102* 102* 90 109* 82    SpO2: 93 %    Vitals:   10/04/23 1247 10/04/23 2035 10/05/23 0623 10/05/23 0635  BP: 123/83 126/61 (!) 100/48 (!) 100/48  Pulse: 78 85 80   Resp: 18 18 16    Temp: 98.2 F (36.8 C) 98.5 F (36.9 C) 98.2 F (36.8 C)   TempSrc: Oral Oral Oral   SpO2: 100% 92% 93%   Weight:      Height:          Data Reviewed:  Basic Metabolic Panel: Recent Labs  Lab 10/01/23 1750 10/02/23 0533 10/03/23 1037 10/04/23 0725 10/05/23 0459  NA 137 138 138 136 136  K 3.7 3.6 3.7 4.1 3.8  CL 107 110 107 105 104  CO2 24 23 25 26 26   GLUCOSE 93 103* 127* 102* 99  BUN 13 9 8 10 10   CREATININE 0.82 0.67 0.74 0.79 1.04*  CALCIUM  8.1* 7.8* 8.1* 7.9* 8.0*    CBC: Recent Labs  Lab 10/01/23 0747  10/02/23 0533 10/02/23 7829 10/02/23 1756 10/02/23 2130 10/03/23 0756 10/04/23 0725 10/05/23 0459  WBC 5.9   < > 5.6 5.9 5.8  --  5.2 6.3  NEUTROABS 3.2  --   --   --   --   --   --   --   HGB 5.7*   < > 7.5* 9.0* 7.5* 8.1* 8.0* 8.2*  HCT 19.6*   < > 25.0* 30.8* 24.4* 28.1* 27.2* 27.4*  MCV 98.5   < > 94.3 96.3 92.1  --  93.5 93.2  PLT 284   < > 267 286 258  --  254 263   < > = values in this interval not displayed.    LFT Recent Labs  Lab 10/01/23 1750 10/02/23 0533 10/04/23 0725 10/05/23 0459  AST 28 22 28 21   ALT 20 18 16 15   ALKPHOS 125 102 102 107  BILITOT 0.4 0.8 0.8 0.5  PROT 5.8* 4.9* 5.3* 5.2*  ALBUMIN 2.7* 2.2* 2.3* 2.4*     Antibiotics: Anti-infectives (From admission, onward)    None        DVT prophylaxis: SCDs  Code Status: Full code  Family Communication: No family at bedside   CONSULTS gastroenterology   Subjective    Patient did not drink bowel prep for EGD and colonoscopy.  Plan for EGD only.  She has declined colonoscopy.   Objective    Physical Examination:  Appears in no acute distress S1-S2, regular, no murmur auscultated Abdomen is soft, nontender, no organomegaly  Status is: Inpatient:             [name] Good   [facility] If 7PM-7AM, please contact night-coverage at [email or web address], Office  [phone]   10/05/2023, 8:41 AM  LOS: 4 days

## 2023-10-05 NOTE — Progress Notes (Addendum)
 Attending physician's note   I have taken a history, reviewed the chart, and examined the patient. I performed a substantive portion of this encounter, including complete performance of at least one of the key components, in conjunction with the APP. I agree with the APP's note, impression, and recommendations with my edits.   Patient declined bowel prep overnight.  Discussed colonoscopy and bowel prep with her again this morning and she states that she does not want to drink the prep at all and does not want to proceed with colonoscopy at all either.  She is agreeable to EGD to evaluate her acute on chronic iron deficiency anemia and heme positive stool further, but has no intent of drinking bowel prep or proceeding with colonoscopy.  Plan to proceed with EGD alone per patient request and can revisit colonoscopy as an outpatient.   Enzley Kitchens, DO, FACG [phone] office         Brief progress Note   Subjective  Chief Complaint: Symptomatic iron deficiency anemia with Hemoccult positive stool  Plan was for EGD and colonoscopy today but patient declined bowel prep.  I rediscussed this with her this morning and she tells me that she is just not up to drinking all of that stuff to clean out.  She does agree to proceed with EGD but then would like to go home.  No new complaints or concerns.   Objective   Vital signs in last 24 hours: Temp:  [98.2 F (36.8 C)-98.5 F (36.9 C)] 98.2 F (36.8 C) (06/10 0623) Pulse Rate:  [78-85] 80 (06/10 0623) Resp:  [16-18] 16 (06/10 0623) BP: (100-131)/(48-83) 100/48 (06/10 0635) SpO2:  [92 %-100 %] 93 % (06/10 0623) Last BM Date : 10/04/23 General:   Elderly white female in NAD Heart:  Regular rate and rhythm; no murmurs Lungs: Respirations even and unlabored, lungs CTA bilaterally Abdomen:  Soft, nontender and nondistended. Normal bowel sounds. Psych:  Cooperative. Normal mood and affect.  Intake/Output from previous day: 06/09  0701 - 06/10 0700 In: 657.4 [P.O.:580; I.V.:77.4] Out: 600 [Urine:600]   Lab Results: Recent Labs    10/02/23 2130 10/03/23 0756 10/04/23 0725 10/05/23 0459  WBC 5.8  --  5.2 6.3  HGB 7.5* 8.1* 8.0* 8.2*  HCT 24.4* 28.1* 27.2* 27.4*  PLT 258  --  254 263   BMET Recent Labs    10/03/23 1037 10/04/23 0725 10/05/23 0459  NA 138 136 136  K 3.7 4.1 3.8  CL 107 105 104  CO2 25 26 26   GLUCOSE 127* 102* 99  BUN 8 10 10   CREATININE 0.74 0.79 1.04*  CALCIUM  8.1* 7.9* 8.0*   LFT Recent Labs    10/05/23 0459  PROT 5.2*  ALBUMIN 2.4*  AST 21  ALT 15  ALKPHOS 107  BILITOT 0.5    Assessment / Plan:   Assessment: 1.  Acute on chronic iron deficiency anemia: Patient developed normocytic anemia about 2 months ago, normal hemoglobin levels prior, recent drop from 10--> 5.7 thought related to recent knee surgery few weeks ago with no overt GI bleeding, iron studies favor iron deficiency with good response to transfusion, hemoglobin stable, but Hemoccult positive; consider GI source as well 2.  Heme positive stool 3.  GERD 4.  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  History of CVA on Plavix : Last dose taken 4 days ago 6.  Multiple other medical problems  Plan: 1.  Continue  Pantoprazole  2.  Patient agrees to EGD but declines colonoscopy as she does not want to complete bowel prep.  We will plan for EGD later today with Dr. Karene Oto, but then will likely sign off. 3.  Patient be n.p.o. until after time of procedure 4.  Please await any further recommendations after time of procedures.  Thank you for your kind consultation.    LOS: 4 days   [name]  10/05/2023, 9:00 AM

## 2023-10-05 NOTE — Telephone Encounter (Signed)
 Patient is currently admitted.  Spoke with husband, Josefina Nian to make him aware that patient will need lab work in 7 to 10 days with no appointment needed.  Orders entered.  Also, informed Josefina Nian that patient is scheduled to follow up in our office on 11-17-23 at 8:20am with Brigitte Canard, PA-C.  Josefina Nian verbalized understanding and had no further questions.

## 2023-10-05 NOTE — Interval H&P Note (Signed)
 History and Physical Interval Note:  10/05/2023 12:58 PM  [name] Good  has presented today for surgery, with the diagnosis of Symptomatic IDA.  The various methods of treatment have been discussed with the patient and family. After consideration of risks, benefits and other options for treatment, the patient has consented to  Procedure(s): EGD (ESOPHAGOGASTRODUODENOSCOPY) (N/A) as a surgical intervention.  The patient's history has been reviewed, patient examined, no change in status, stable for surgery.  I have reviewed the patient's chart and labs.  Questions were answered to the patient's satisfaction.     Laquetta Plank Kanita Delage

## 2023-10-05 NOTE — Op Note (Signed)
 [facility] Patient Name: [name] Good Procedure Date: 10/05/2023 MRN: [id number] Attending MD: Harry Lindau , MD, [id number] Date of Birth: [birth year] CSN: [id number] Age: 73 Admit Type: Inpatient Procedure:                Upper GI endoscopy Indications:              Iron deficiency anemia, Heme positive stool                           73 year old female admitted with acute on chronic                            iron deficiency anemia with heme positive stool but                            no overt bleeding. Admission hemoglobin 5.7 from                            baseline ~10. No previous EGD/colonoscopy. Good                            response to 2 unit PRBC transfusion with stable H/H                            8.2/27 today. No bleeding since hospital admission.                            Patient declined colonoscopy. Plan to proceed with                            EGD today for diagnostic and therapeutic intent. Providers:                Harry Lindau, MD, Ila Malay, RN, Marvelyn Slim, Technician Referring MD:              Medicines:                Monitored Anesthesia Care Complications:            No immediate complications. Estimated Blood Loss:     Estimated blood loss was minimal. Procedure:                Pre-Anesthesia Assessment:                           - Prior to the procedure, a History and Physical                            was performed, and patient medications and                            allergies were reviewed. The patient's tolerance of  previous anesthesia was also reviewed. The risks                            and benefits of the procedure and the sedation                            options and risks were discussed with the patient.                            All questions were answered, and informed consent                            was obtained. Prior Anticoagulants: The  patient has                            taken Plavix  (clopidogrel ) and Coumadin  (warfarin),                            last dose was 5 days prior to procedure. ASA Grade                            Assessment: III - A patient with severe systemic                            disease. After reviewing the risks and benefits,                            the patient was deemed in satisfactory condition to                            undergo the procedure.                           After obtaining informed consent, the endoscope was                            passed under direct vision. Throughout the                            procedure, the patient's blood pressure, pulse, and                            oxygen saturations were monitored continuously. The                            GIF-H190 ([id number] Olympus endoscope was introduced                            through the mouth, and advanced to the third part                            of duodenum. The upper GI endoscopy was  accomplished without difficulty. The patient                            tolerated the procedure well. Scope In: Scope Out: Findings:      The examined esophagus was normal.      Localized mild inflammation characterized by erythema was found in the       gastric antrum. Biopsies were taken with a cold forceps for histology       and Helicobacter pylori testing. Estimated blood loss was minimal.      The cardia, gastric fundus, gastric body and incisura were normal.      Two medium non-bleeding diverticula were found in the second portion of       the duodenum and in the third portion of the duodenum.      Two small angioectasias with typical arborization were found in the       second portion of the duodenum. 1 of these AVMs was located at the base       of the diverticulum. Coagulation for hemostasis using argon plasma was       successful. Estimated blood loss: none.      The mucosa was otherwise  normal-appearing throughout the visualized       duodenum. Impression:               - Normal esophagus.                           - Mild, non-ulcer antral gastritis. Biopsied.                           - Normal cardia, gastric fundus, gastric body and                            incisura.                           - Two duodenal diverticula.                           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 One of these                            AVMs was located at the base of a diverticulum.                            These were each carefully treated with argon plasma                            coagulation (APC).                           - Normal mucosa was found in the entire examined                            duodenum. Moderate Sedation:      Not Applicable - Patient had care per Anesthesia. Recommendation:           - Return patient to hospital ward  for ongoing care.                           - Advance diet as tolerated.                           - Continue present medications.                           - Await pathology results.                           - Repeat CBC 7-10 days after hospital discharge to                            ensure returning to baseline with repeat iron panel                            in 2 months.                           - To follow-up in the GI clinic at appointment to                            be scheduled. Can discuss potentially performing                            outpatient colonoscopy at follow-up appointment.                           - Inpatient GI service will sign off at this time.                            Please do not hesitate to contact us  with                            additional questions or concerns. Procedure Code(s):        --- Professional ---                           43255, 59, Esophagogastroduodenoscopy, flexible,                            transoral; with control of bleeding, any method                           43239,  Esophagogastroduodenoscopy, flexible,                            transoral; with biopsy, single or multiple Diagnosis Code(s):        --- Professional ---                           K29.70, Gastritis, unspecified, without bleeding  K31.819, Angiodysplasia of stomach and duodenum                            without bleeding                           D50.9, Iron deficiency anemia, unspecified                           R19.5, Other fecal abnormalities                           K57.10, Diverticulosis of small intestine without                            perforation or abscess without bleeding CPT copyright 2022 [employer]. All rights reserved. The codes documented in this report are preliminary and upon coder review may  be revised to meet current compliance requirements. Harry Lindau, MD 10/05/2023 1:50:53 PM Number of Addenda: 0

## 2023-10-05 NOTE — Progress Notes (Signed)
 RN and NT attempted multiple times from 7pm to 1am to get patient to drink prep. Patient tried one sip and gagged and said no. Patient was extremely sleepy at the beginning of my shift 7pm. When pain medications started to wear off and I was able to address patient about drinking prep she was in tears and crying out in knee pain. She wanted pain medications and to go back to sleep. Patient refused multiple times to RN when asked to drink the prep.

## 2023-10-05 NOTE — Telephone Encounter (Signed)
-----   Message from Annis Kinder sent at 10/05/2023  1:51 PM EDT ----- Trudell Fury, I anticipate this patient to be discharged home in the next 24 hours or so.  Can you please arrange for outpatient follow-up with Dr. Elvin Hammer or one of the pod A APP's in the next 4-6 weeks or so.    Peterson Brandt, will also need repeat CBC check in 7-10 days as outpatient, which can be ordered under Dr. Elvin Hammer (her primary GI) or myself.    Thanks.

## 2023-10-05 NOTE — H&P (View-Only) (Signed)
 Attending physician's note   I have taken a history, reviewed the chart, and examined the patient. I performed a substantive portion of this encounter, including complete performance of at least one of the key components, in conjunction with the APP. I agree with the APP's note, impression, and recommendations with my edits.   Patient declined bowel prep overnight.  Discussed colonoscopy and bowel prep with her again this morning and she states that she does not want to drink the prep at all and does not want to proceed with colonoscopy at all either.  She is agreeable to EGD to evaluate her acute on chronic iron deficiency anemia and heme positive stool further, but has no intent of drinking bowel prep or proceeding with colonoscopy.  Plan to proceed with EGD alone per patient request and can revisit colonoscopy as an outpatient.   [name] Kitchens, DO, FACG [phone] office         Brief progress Note   Subjective  Chief Complaint: Symptomatic iron deficiency anemia with Hemoccult positive stool  Plan was for EGD and colonoscopy today but patient declined bowel prep.  I rediscussed this with her this morning and she tells me that she is just not up to drinking all of that stuff to clean out.  She does agree to proceed with EGD but then would like to go home.  No new complaints or concerns.   Objective   Vital signs in last 24 hours: Temp:  [98.2 F (36.8 C)-98.5 F (36.9 C)] 98.2 F (36.8 C) (06/10 0623) Pulse Rate:  [78-85] 80 (06/10 0623) Resp:  [16-18] 16 (06/10 0623) BP: (100-131)/(48-83) 100/48 (06/10 0635) SpO2:  [92 %-100 %] 93 % (06/10 0623) Last BM Date : 10/04/23 General:   Elderly white female in NAD Heart:  Regular rate and rhythm; no murmurs Lungs: Respirations even and unlabored, lungs CTA bilaterally Abdomen:  Soft, nontender and nondistended. Normal bowel sounds. Psych:  Cooperative. Normal mood and affect.  Intake/Output from previous day: 06/09  0701 - 06/10 0700 In: 657.4 [P.O.:580; I.V.:77.4] Out: 600 [Urine:600]   Lab Results: Recent Labs    10/02/23 2130 10/03/23 0756 10/04/23 0725 10/05/23 0459  WBC 5.8  --  5.2 6.3  HGB 7.5* 8.1* 8.0* 8.2*  HCT 24.4* 28.1* 27.2* 27.4*  PLT 258  --  254 263   BMET Recent Labs    10/03/23 1037 10/04/23 0725 10/05/23 0459  NA 138 136 136  K 3.7 4.1 3.8  CL 107 105 104  CO2 25 26 26   GLUCOSE 127* 102* 99  BUN 8 10 10   CREATININE 0.74 0.79 1.04*  CALCIUM  8.1* 7.9* 8.0*   LFT Recent Labs    10/05/23 0459  PROT 5.2*  ALBUMIN 2.4*  AST 21  ALT 15  ALKPHOS 107  BILITOT 0.5    Assessment / Plan:   Assessment: 1.  Acute on chronic iron deficiency anemia: Patient developed normocytic anemia about 2 months ago, normal hemoglobin levels prior, recent drop from 10--> 5.7 thought related to recent knee surgery few weeks ago with no overt GI bleeding, iron studies favor iron deficiency with good response to transfusion, hemoglobin stable, but Hemoccult positive; consider GI source as well 2.  Heme positive stool 3.  GERD 4.  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  History of CVA on Plavix : Last dose taken 4 days ago 6.  Multiple other medical problems  Plan: 1.  Continue  Pantoprazole  2.  Patient agrees to EGD but declines colonoscopy as she does not want to complete bowel prep.  We will plan for EGD later today with Dr. Karene Oto, but then will likely sign off. 3.  Patient be n.p.o. until after time of procedure 4.  Please await any further recommendations after time of procedures.  Thank you for your kind consultation.    LOS: 4 days   [name]  10/05/2023, 9:00 AM

## 2023-10-05 NOTE — Anesthesia Postprocedure Evaluation (Signed)
 Anesthesia Post Note  Patient: DEJANA PUGSLEY  Procedure(s) Performed: EGD (ESOPHAGOGASTRODUODENOSCOPY) EGD, WITH ARGON PLASMA COAGULATION     Patient location during evaluation: Endoscopy Anesthesia Type: MAC Level of consciousness: awake and alert Pain management: pain level controlled Vital Signs Assessment: post-procedure vital signs reviewed and stable Respiratory status: spontaneous breathing, nonlabored ventilation, respiratory function stable and patient connected to nasal cannula oxygen Cardiovascular status: blood pressure returned to baseline and stable Postop Assessment: no apparent nausea or vomiting Anesthetic complications: no  No notable events documented.  Last Vitals:  Vitals:   10/05/23 1601 10/05/23 1608  BP: (!) 101/50 (!) 144/59  Pulse: 92 88  Resp:    Temp:    SpO2: 97%     Last Pain:  Vitals:   10/05/23 1446  TempSrc: Oral  PainSc:                  Rosalita Combe

## 2023-10-06 DIAGNOSIS — D649 Anemia, unspecified: Secondary | ICD-10-CM | POA: Diagnosis not present

## 2023-10-06 LAB — CBC
HCT: 28.4 % — ABNORMAL LOW (ref 36.0–46.0)
Hemoglobin: 8.1 g/dL — ABNORMAL LOW (ref 12.0–15.0)
MCH: 27.2 pg (ref 26.0–34.0)
MCHC: 28.5 g/dL — ABNORMAL LOW (ref 30.0–36.0)
MCV: 95.3 fL (ref 80.0–100.0)
Platelets: 259 10*3/uL (ref 150–400)
RBC: 2.98 MIL/uL — ABNORMAL LOW (ref 3.87–5.11)
RDW: 16.4 % — ABNORMAL HIGH (ref 11.5–15.5)
WBC: 6.1 10*3/uL (ref 4.0–10.5)
nRBC: 0 % (ref 0.0–0.2)

## 2023-10-06 LAB — COMPREHENSIVE METABOLIC PANEL WITH GFR
ALT: 15 U/L (ref 0–44)
AST: 21 U/L (ref 15–41)
Albumin: 2.4 g/dL — ABNORMAL LOW (ref 3.5–5.0)
Alkaline Phosphatase: 100 U/L (ref 38–126)
Anion gap: 6 (ref 5–15)
BUN: 9 mg/dL (ref 8–23)
CO2: 24 mmol/L (ref 22–32)
Calcium: 8.1 mg/dL — ABNORMAL LOW (ref 8.9–10.3)
Chloride: 107 mmol/L (ref 98–111)
Creatinine, Ser: 0.58 mg/dL (ref 0.44–1.00)
GFR, Estimated: >60 mL/min (ref >60)
Glucose, Bld: 85 mg/dL (ref 70–99)
Potassium: 3.9 mmol/L (ref 3.5–5.1)
Sodium: 137 mmol/L (ref 135–145)
Total Bilirubin: 0.5 mg/dL (ref 0.0–1.2)
Total Protein: 4.9 g/dL — ABNORMAL LOW (ref 6.5–8.1)

## 2023-10-06 LAB — GLUCOSE, CAPILLARY: Glucose-Capillary: 76 mg/dL (ref 70–99)

## 2023-10-06 MED ORDER — PANTOPRAZOLE SODIUM 40 MG PO TBEC: 40.0000 mg | DELAYED_RELEASE_TABLET | Freq: Two times a day (BID) | ORAL | 1 refills | Status: AC

## 2023-10-06 MED ORDER — PANTOPRAZOLE SODIUM 40 MG PO TBEC
40.0000 mg | DELAYED_RELEASE_TABLET | Freq: Two times a day (BID) | ORAL | Status: DC
  Administered 2023-10-06: 40 mg via ORAL
  Filled 2023-10-06: qty 1

## 2023-10-06 NOTE — Discharge Summary (Signed)
 Physician Discharge Summary  [name] Good XLK:[id number] DOB: 10-22-1950 DOA: 10/01/2023  PCP: Rodney Clamp, MD  Admit date: 10/01/2023 Discharge date: 10/06/2023  Admitted From: Home Disposition:  Home  Recommendations for Outpatient Follow-up:  Follow up with PCP in 1 week Follow up with GI Brigitte Canard PA as scheduled 11/17/23 to discuss outpatient colonoscopy  Repeat CBC and INR.  She can resume her Plavix  and Coumadin  6/14. Repeat iron panel in 2 months   Discharge Condition: Stable CODE STATUS: Full code Diet recommendation: Heart healthy diet.  Brief/Interim Summary: [name] Good is a 73 y.o. female with medical history significant of GERD, previous CVA, depression with anxiety, coronary artery disease, history of SMV thrombosis with chronic Coumadin  therapy, presumed antiphospholipid syndrome also on Coumadin  who presented to the ER with progressive shortness of breath and weakness.  Patient was seen by have primary care physician where hemoglobin was found to be 5.8.  Her previous hemoglobin was more than 10.  Patient is also still guaiac positive x 2.  She was given blood transfusions.  GI was consulted and she underwent EGD 6/10 which revealed 2 small AVMs, treated with APC.   Discharge Diagnoses:   Principal Problem:   Symptomatic anemia Active Problems:   History of CVA (cerebrovascular accident)   Autoimmune disease (HCC)   Obesity (BMI 35.0-39.9 without comorbidity)   T2DM (type 2 diabetes mellitus) (HCC)   Dyslipidemia associated with type 2 diabetes mellitus (HCC)   GI bleed   Iron deficiency anemia due to chronic blood loss   Heme positive stool   Chronic anticoagulation   Platelet inhibition due to Plavix    Gastroesophageal reflux disease   Iron deficiency anemia   Gastritis and gastroduodenitis   AVM (arteriovenous malformation) of small bowel, acquired   Duodenal diverticulum    Discharge Instructions  Discharge Instructions     Call MD for:   difficulty breathing, headache or visual disturbances   Complete by: As directed    Call MD for:  extreme fatigue   Complete by: As directed    Call MD for:  persistant dizziness or light-headedness   Complete by: As directed    Call MD for:  persistant nausea and vomiting   Complete by: As directed    Call MD for:  severe uncontrolled pain   Complete by: As directed    Call MD for:  temperature >100.4   Complete by: As directed    Diet - low sodium heart healthy   Complete by: As directed    Discharge instructions   Complete by: As directed    You were cared for by a hospitalist during your hospital stay. If you have any questions about your discharge medications or the care you received while you were in the hospital after you are discharged, you can call the unit and ask to speak with the hospitalist on call if the hospitalist that took care of you is not available. Once you are discharged, your primary care physician will handle any further medical issues. Please note that NO REFILLS for any discharge medications will be authorized once you are discharged, as it is imperative that you return to your primary care physician (or establish a relationship with a primary care physician if you do not have one) for your aftercare needs so that they can reassess your need for medications and monitor your lab values.   Increase activity slowly   Complete by: As directed       Allergies  as of 10/06/2023       Reactions   Amoxicillin Other (See Comments)   Tolerated Zosyn  Has patient had a PCN reaction causing immediate rash, facial/tongue/throat swelling, SOB or lightheadedness with hypotension: No Has patient had a PCN reaction causing severe rash involving mucus membranes or skin necrosis: No Has patient had a PCN reaction that required hospitalization No Has patient had a PCN reaction occurring within the last 10 years: No If all of the above answers are NO, then may proceed with  Cephalosporin use.        Medication List     PAUSE taking these medications    clopidogrel  75 MG tablet Wait to take this until: October 09, 2023 Commonly known as: PLAVIX  Take 1 tablet (75 mg total) by mouth daily.   warfarin 2.5 MG tablet Wait to take this until: October 09, 2023 Commonly known as: COUMADIN  Take as directed. If you are unsure how to take this medication, talk to your nurse or doctor. Original instructions: TAKE 1 TABLET BY MOUTH DAILY OR AS DIRECTED BY ANTICOAGULATION CLINIC       STOP taking these medications    chlorhexidine  4 % external liquid Commonly known as: HIBICLENS        TAKE these medications    atorvastatin  80 MG tablet Commonly known as: LIPITOR  Take 1 tablet (80 mg total) by mouth daily. What changed: when to take this   baclofen  10 MG tablet Commonly known as: LIORESAL  Take 1 tablet (10 mg total) by mouth 3 (three) times daily as needed for muscle spasms.   calcium  carbonate 750 MG chewable tablet Commonly known as: TUMS EX Chew 1 tablet by mouth daily as needed for heartburn.   diphenhydrAMINE  25 mg capsule Commonly known as: BENADRYL  Take 25 mg by mouth at bedtime.   docusate sodium  100 MG capsule Commonly known as: COLACE Take 100 mg by mouth daily as needed for mild constipation or moderate constipation.   escitalopram  20 MG tablet Commonly known as: LEXAPRO  TAKE 1 TABLET BY MOUTH DAILY   ezetimibe  10 MG tablet Commonly known as: ZETIA  Take 1 tablet (10 mg total) by mouth daily. What changed: when to take this   famotidine  20 MG tablet Commonly known as: PEPCID  Take 20 mg by mouth daily as needed for heartburn or indigestion.   oxyCODONE -acetaminophen  10-325 MG tablet Commonly known as: PERCOCET Take 1 tablet by mouth 5 (five) times daily.   pantoprazole  40 MG tablet Commonly known as: PROTONIX  Take 1 tablet (40 mg total) by mouth 2 (two) times daily before a meal.   phentermine  37.5 MG tablet Commonly known  as: ADIPEX-P  TAKE 1 TABLET BY MOUTH EVERY MORNING   predniSONE  5 MG tablet Commonly known as: DELTASONE  TAKE 1 TABLET BY MOUTH DAILY What changed: when to take this   pregabalin  25 MG capsule Commonly known as: Lyrica  Take 1 capsule (25 mg total) by mouth 2 (two) times daily.   rOPINIRole  3 MG tablet Commonly known as: REQUIP  Take 3 mg by mouth daily.   triamcinolone  ointment 0.5 % Commonly known as: KENALOG  Apply 1 Application topically 2 (two) times daily.        Follow-up Information     Rodney Clamp, MD Follow up.   Specialty: Family Medicine Why: Repeat CBC and INR.  Resume Plavix  and Coumadin  6/14. Contact information: [address] [place] [address] [phone]         [facility]. Go on 11/17/2023.   Specialty:  Gastroenterology Contact information: [address] [place]  [address] [phone]               Allergies  Allergen Reactions   Amoxicillin Other (See Comments)    Tolerated Zosyn  Has patient had a PCN reaction causing immediate rash, facial/tongue/throat swelling, SOB or lightheadedness with hypotension: No Has patient had a PCN reaction causing severe rash involving mucus membranes or skin necrosis: No Has patient had a PCN reaction that required hospitalization No Has patient had a PCN reaction occurring within the last 10 years: No If all of the above answers are NO, then may proceed with Cephalosporin use.    Procedures/Studies: DG Chest 2 View Result Date: 10/01/2023 CLINICAL DATA:  Shortness of breath and anemia EXAM: CHEST - 2 VIEW COMPARISON:  Chest radiograph dated 03/08/2020 FINDINGS: Left lower lung patchy opacity. Blunting of the bilateral costophrenic angles. No pneumothorax. Similar mildly enlarged cardiomediastinal silhouette. No acute osseous abnormality. IMPRESSION: 1. Left lower lung patchy opacity, which may represent atelectasis or pulmonary edema. Aspiration  or pneumonia can be considered in the appropriate clinical setting. 2. Blunting of the bilateral costophrenic angles, which may represent trace pleural effusions. Electronically Signed   By: Limin  Xu M.D.   On: 10/01/2023 17:56   EGD 10/05/23 Impression:               - Normal esophagus.                           - Mild, non-ulcer antral gastritis. Biopsied.                           - Normal cardia, gastric fundus, gastric body and                            incisura.                           - Two duodenal diverticula.                           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 One of these                            AVMs was located at the base of a diverticulum.                            These were each carefully treated with argon plasma                            coagulation (APC).                           - Normal mucosa was found in the entire examined                            duodenum.    Discharge Exam: Vitals:   10/06/23 0029 10/06/23 0323  BP: (!) 103/48 (!) 116/56  Pulse: 83 82  Resp: 18 18  Temp: 98.6 F (37 C) 98.6 F (37 C)  SpO2: 96%  94%     General: Pt is alert, awake, not in acute distress Cardiovascular: RRR, S1/S2 +, no edema Respiratory: CTA bilaterally, no wheezing, no rhonchi, no respiratory distress, no conversational dyspnea  Abdominal: Soft, NT, ND, bowel sounds + Extremities: no edema, no cyanosis Psych: Normal mood and affect, stable judgement and insight     The results of significant diagnostics from this hospitalization (including imaging, microbiology, ancillary and laboratory) are listed below for reference.     Microbiology: No results found for this or any previous visit (from the past 240 hours).   Labs: BNP (last 3 results) No results for input(s): BNP in the last 8760 hours. Basic Metabolic Panel: Recent Labs  Lab 10/02/23 0533 10/03/23 1037 10/04/23 0725 10/05/23 0459 10/06/23 0516  NA 138 138 136 136 137  K 3.6  3.7 4.1 3.8 3.9  CL 110 107 105 104 107  CO2 23 25 26 26 24   GLUCOSE 103* 127* 102* 99 85  BUN 9 8 10 10 9   CREATININE 0.67 0.74 0.79 1.04* 0.58  CALCIUM  7.8* 8.1* 7.9* 8.0* 8.1*   Liver Function Tests: Recent Labs  Lab 10/01/23 1750 10/02/23 0533 10/04/23 0725 10/05/23 0459 10/06/23 0516  AST 28 22 28 21 21   ALT 20 18 16 15 15   ALKPHOS 125 102 102 107 100  BILITOT 0.4 0.8 0.8 0.5 0.5  PROT 5.8* 4.9* 5.3* 5.2* 4.9*  ALBUMIN 2.7* 2.2* 2.3* 2.4* 2.4*   No results for input(s): LIPASE, AMYLASE in the last 168 hours. No results for input(s): AMMONIA in the last 168 hours. CBC: Recent Labs  Lab 10/01/23 0747 10/02/23 0533 10/02/23 1756 10/02/23 2130 10/03/23 0756 10/04/23 0725 10/05/23 0459 10/06/23 0516  WBC 5.9   < > 5.9 5.8  --  5.2 6.3 6.1  NEUTROABS 3.2  --   --   --   --   --   --   --   HGB 5.7*   < > 9.0* 7.5* 8.1* 8.0* 8.2* 8.1*  HCT 19.6*   < > 30.8* 24.4* 28.1* 27.2* 27.4* 28.4*  MCV 98.5   < > 96.3 92.1  --  93.5 93.2 95.3  PLT 284   < > 286 258  --  254 263 259   < > = values in this interval not displayed.   Cardiac Enzymes: No results for input(s): CKTOTAL, CKMB, CKMBINDEX, TROPONINI in the last 168 hours. BNP: Invalid input(s): POCBNP CBG: Recent Labs  Lab 10/05/23 1149 10/05/23 1244 10/05/23 1606 10/05/23 2140 10/06/23 0729  GLUCAP 105* 88 97 92 76   D-Dimer No results for input(s): DDIMER in the last 72 hours. Hgb A1c No results for input(s): HGBA1C in the last 72 hours. Lipid Profile No results for input(s): CHOL, HDL, LDLCALC, TRIG, CHOLHDL, LDLDIRECT in the last 72 hours. Thyroid  function studies No results for input(s): TSH, T4TOTAL, T3FREE, THYROIDAB in the last 72 hours.  Invalid input(s): FREET3 Anemia work up No results for input(s): VITAMINB12, FOLATE, FERRITIN, TIBC, IRON, RETICCTPCT in the last 72 hours. Urinalysis    Component Value Date/Time   COLORURINE YELLOW  12/07/2022 0812   APPEARANCEUR CLEAR 12/07/2022 0812   LABSPEC >1.046 (H) 12/07/2022 0812   PHURINE 7.0 12/07/2022 0812   GLUCOSEU NEGATIVE 12/07/2022 0812   HGBUR SMALL (A) 12/07/2022 0812   BILIRUBINUR NEGATIVE 12/07/2022 0812   KETONESUR 5 (A) 12/07/2022 0812   PROTEINUR NEGATIVE 12/07/2022 0812   NITRITE NEGATIVE 12/07/2022 0812   LEUKOCYTESUR SMALL (A) 12/07/2022 1308  Sepsis Labs Recent Labs  Lab 10/02/23 2130 10/04/23 0725 10/05/23 0459 10/06/23 0516  WBC 5.8 5.2 6.3 6.1   Microbiology No results found for this or any previous visit (from the past 240 hours).   Patient was seen and examined on the day of discharge and was found to be in stable condition. Time coordinating discharge: 35 minutes including assessment and coordination of care, as well as examination of the patient.   SIGNED:  Daren Eck, DO [facility] 10/06/2023, 10:52 AM

## 2023-10-06 NOTE — TOC Transition Note (Signed)
 Transition of Care [facility]) - Discharge Note   Patient Details  Name: [name] Good MRN: [id number] Date of Birth: 1950-06-06  Transition of Care [facility]) CM/SW Contact:  Ruben Corolla, RN Phone Number: 10/06/2023, 11:09 AM   Clinical Narrative: d/c home no CM needs.      Final next level of care: Home/Self Care Barriers to Discharge: No Barriers Identified   Patient Goals and CMS Choice Patient states their goals for this hospitalization and ongoing recovery are:: Home CMS Medicare.gov Compare Post Acute Care list provided to:: Patient Choice offered to / list presented to : Patient      Discharge Placement                       Discharge Plan and Services Additional resources added to the After Visit Summary for                                       Social Drivers of Health (SDOH) Interventions SDOH Screenings   Food Insecurity: No Food Insecurity (10/01/2023)  Housing: Low Risk  (10/01/2023)  Transportation Needs: No Transportation Needs (10/01/2023)  Utilities: Not At Risk (10/01/2023)  Alcohol Screen: Low Risk  (04/29/2020)  Depression (PHQ2-9): Medium Risk (08/05/2023)  Financial Resource Strain: Low Risk  (11/05/2022)  Physical Activity: Inactive (11/05/2022)  Social Connections: Moderately Isolated (10/01/2023)  Stress: No Stress Concern Present (11/05/2022)  Tobacco Use: Medium Risk (10/05/2023)  Health Literacy: Adequate Health Literacy (11/05/2022)     Readmission Risk Interventions    10/04/2023   10:15 AM  Readmission Risk Prevention Plan  Transportation Screening Complete  PCP or Specialist Appt within 5-7 Days Complete  Home Care Screening Complete  Medication Review (RN CM) Complete

## 2023-10-06 NOTE — Telephone Encounter (Signed)
 Review of chart reveals pt is still in hospital. She is to be d/c today or tomorrow. Note in chart reports pt is to resume clopidogrel  and warfarin on 6/14.  Will f/u with pt and her daughter concerning INR check next week.

## 2023-10-07 ENCOUNTER — Telehealth: Payer: Self-pay | Admitting: *Deleted

## 2023-10-07 ENCOUNTER — Telehealth: Payer: Self-pay | Admitting: Orthopaedic Surgery

## 2023-10-07 ENCOUNTER — Ambulatory Visit: Payer: Self-pay | Admitting: Gastroenterology

## 2023-10-07 ENCOUNTER — Ambulatory Visit: Payer: Self-pay | Admitting: Family Medicine

## 2023-10-07 LAB — IBC + FERRITIN
Ferritin: 39.1 ng/mL (ref 10.0–291.0)
Iron: 11 ug/dL — ABNORMAL LOW (ref 42–145)
Saturation Ratios: 2.9 % — ABNORMAL LOW (ref 20.0–50.0)
TIBC: 382.2 ug/dL (ref 250.0–450.0)
Transferrin: 273 mg/dL (ref 212.0–360.0)

## 2023-10-07 LAB — VITAMIN D 25 HYDROXY (VIT D DEFICIENCY, FRACTURES): VITD: 13.46 ng/mL — ABNORMAL LOW (ref 30.00–100.00)

## 2023-10-07 LAB — SURGICAL PATHOLOGY

## 2023-10-07 NOTE — Telephone Encounter (Signed)
 Pt ststed because of her stay in the hospital there has to be a new referral sent for her to continue to do PT at home. Best call back number [phone]

## 2023-10-07 NOTE — Progress Notes (Signed)
 Her critically low hemoglobin has already been addressed in the hospital but her vitamin D also came back low. We can discuss more at her follow up visit, but recommend that she start vitamin D 50000 IU weekly and we can recheck in 3 months.

## 2023-10-07 NOTE — Transitions of Care (Post Inpatient/ED Visit) (Signed)
 10/07/2023  Name: [name] Good MRN: [id number] DOB: Sep 09, 1950  Today's TOC FU Call Status: Today's TOC FU Call Status:: Successful TOC FU Call Completed TOC FU Call Complete Date: 10/07/23 Patient's Name and Date of Birth confirmed.  Transition Care Management Follow-up Telephone Call Date of Discharge: 10/06/23 Discharge Facility: [facility]) Type of Discharge: Inpatient Admission Primary Inpatient Discharge Diagnosis:: Symptomatic anemia How have you been since you were released from the hospital?:  (appetite good, drinking adequate fluids, ambulating with walker, no issues with bowel/ bladder) Any questions or concerns?: No  Items Reviewed: Did you receive and understand the discharge instructions provided?: Yes Any new allergies since your discharge?: No Dietary orders reviewed?: Yes Type of Diet Ordered:: heart healthy, low sodium Do you have support at home?: Yes People in Home [RPT]: spouse Name of Support/Comfort Primary Source: Amulya Quintin Reviewed signs/ symptoms bleeding, reportable signs/ symptoms Reviewed starting Plavix  and Coumadin  on 10/09/23 Reviewed safety precautions  Medications Reviewed Today: Medications Reviewed Today     Reviewed by Daralyn Earl, RN (Registered Nurse) on 10/07/23 at 1039  Med List Status: <None>   Medication Order Taking? Sig Documenting Provider Last Dose Status Informant  atorvastatin  (LIPITOR ) 80 MG tablet [id number] Yes Take 1 tablet (80 mg total) by mouth daily. Rodney Clamp, MD  Active Self, Pharmacy Records           Med Note (CRUTHIS, CHLOE C   Tue Aug 24, 2023 11:34 AM) Pt is adamant she is still taking this medication daily. Dispense report does not support this claim.   baclofen  (LIORESAL ) 10 MG tablet [id number] Yes Take 1 tablet (10 mg total) by mouth 3 (three) times daily as needed for muscle spasms. Arnie Lao, MD  Active Self, Pharmacy Records  calcium  carbonate (TUMS EX) 750 MG chewable tablet  [id number]  Chew 1 tablet by mouth daily as needed for heartburn.  Patient not taking: Reported on 10/07/2023   [provider]  Active Self, Pharmacy Records           Med Note (CRUTHIS, CHLOE C   Tue Aug 24, 2023 11:30 AM)    clopidogrel  (PLAVIX ) 75 MG tablet [id number]  Take 1 tablet (75 mg total) by mouth daily.  Patient not taking: Reported on 10/07/2023   Rodney Clamp, MD  Active Self, Pharmacy Records  diphenhydrAMINE  (BENADRYL ) 25 mg capsule [id number] Yes Take 25 mg by mouth at bedtime. [provider]  Active Self, Pharmacy Records  docusate sodium  (COLACE) 100 MG capsule [id number] Yes Take 100 mg by mouth daily as needed for mild constipation or moderate constipation. [provider]  Active Self, Pharmacy Records  escitalopram  (LEXAPRO ) 20 MG tablet [id number] Yes TAKE 1 TABLET BY MOUTH DAILY Rodney Clamp, MD  Active Self, Pharmacy Records  ezetimibe  (ZETIA ) 10 MG tablet [id number] Yes Take 1 tablet (10 mg total) by mouth daily. Rodney Clamp, MD  Active Self, Pharmacy Records  famotidine  (PEPCID ) 20 MG tablet [id number]  Take 20 mg by mouth daily as needed for heartburn or indigestion.  Patient not taking: Reported on 10/07/2023   [provider]  Active Self, Pharmacy Records  oxyCODONE -acetaminophen  (PERCOCET) 10-325 MG tablet [id number] Yes Take 1 tablet by mouth 5 (five) times daily. Rodney Clamp, MD  Active Self, Pharmacy Records  pantoprazole  (PROTONIX ) 40 MG tablet [id number] Yes Take 1 tablet (40 mg total) by mouth 2 (two) times daily before a meal. Daren Eck, DO  Active  phentermine  (ADIPEX-P ) 37.5 MG tablet [id number]  TAKE 1 TABLET BY MOUTH EVERY MORNING  Patient not taking: Reported on 10/07/2023   Rodney Clamp, MD  Active Self, Pharmacy Records  predniSONE  (DELTASONE ) 5 MG tablet [id number] Yes TAKE 1 TABLET BY MOUTH DAILY Rodney Clamp, MD  Active Self, Pharmacy Records  pregabalin  (LYRICA ) 25 MG capsule [id number] Yes Take 1  capsule (25 mg total) by mouth 2 (two) times daily. Rodney Clamp, MD  Active Self, Pharmacy Records           Med Note Desiree Florence, [facility]   Sat Oct 02, 2023  8:21 AM) Just prescribed, pt has not started this yet  rOPINIRole  (REQUIP ) 3 MG tablet [id number] Yes Take 3 mg by mouth daily. [provider]  Active   triamcinolone  ointment (KENALOG ) 0.5 % [id number] Yes Apply 1 Application topically 2 (two) times daily. Rodney Clamp, MD  Active Self, Pharmacy Records  warfarin (COUMADIN ) 2.5 MG tablet [id number]  TAKE 1 TABLET BY MOUTH DAILY OR AS DIRECTED BY ANTICOAGULATION CLINIC  Patient not taking: Reported on 10/07/2023   Rodney Clamp, MD  Active Self, Pharmacy Records           Med Note Desiree Florence, [facility]   Sat Oct 02, 2023  8:22 AM) Pt states she is taking 2.5 mg daily            Home Care and Equipment/Supplies: Were Home Health Services Ordered?: No  Functional Questionnaire: Do you need assistance with bathing/showering or dressing?: No Do you need assistance with meal preparation?: No Do you need assistance with eating?: No Do you have difficulty maintaining continence: No Do you need assistance with getting out of bed/getting out of a chair/moving?: Yes (walker) Do you have difficulty managing or taking your medications?: No  Follow up appointments reviewed: PCP Follow-up appointment confirmed?: Yes Date of PCP follow-up appointment?: 10/11/23 Follow-up Provider: Valdene Garret @ 1020 am Specialist Hospital Follow-up appointment confirmed?: Yes Date of Specialist follow-up appointment?: 11/17/23 Follow-Up Specialty Provider:: GI @ 820 am  Brigitte Canard Do you need transportation to your follow-up appointment?: No Do you understand care options if your condition(s) worsen?: Yes-patient verbalized understanding  SDOH Interventions Today    Flowsheet Row Most Recent Value  SDOH Interventions   Food Insecurity Interventions Intervention Not Indicated   Housing Interventions Intervention Not Indicated  Transportation Interventions Intervention Not Indicated  Utilities Interventions Intervention Not Indicated    Cecilie Coffee [facility], BSN RN Care Manager/ Transition of Care / [facility] Population Health [phone]

## 2023-10-08 ENCOUNTER — Other Ambulatory Visit: Payer: Self-pay | Admitting: *Deleted

## 2023-10-08 ENCOUNTER — Encounter (HOSPITAL_COMMUNITY): Payer: Self-pay | Admitting: Gastroenterology

## 2023-10-08 MED ORDER — VITAMIN D (ERGOCALCIFEROL) 1.25 MG (50000 UNIT) PO CAPS: 50000.0000 [IU] | ORAL_CAPSULE | ORAL | 0 refills | Status: AC

## 2023-10-08 NOTE — Telephone Encounter (Signed)
 [name] aware

## 2023-10-11 ENCOUNTER — Telehealth: Payer: Self-pay

## 2023-10-11 ENCOUNTER — Ambulatory Visit: Payer: Self-pay

## 2023-10-11 ENCOUNTER — Ambulatory Visit: Admitting: Family Medicine

## 2023-10-11 VITALS — BP 118/70 | HR 98 | Temp 97.7°F | Ht 63.0 in | Wt 188.0 lb

## 2023-10-11 DIAGNOSIS — D509 Iron deficiency anemia, unspecified: Secondary | ICD-10-CM

## 2023-10-11 DIAGNOSIS — K922 Gastrointestinal hemorrhage, unspecified: Secondary | ICD-10-CM | POA: Diagnosis not present

## 2023-10-11 DIAGNOSIS — K55069 Acute infarction of intestine, part and extent unspecified: Secondary | ICD-10-CM | POA: Diagnosis not present

## 2023-10-11 MED ORDER — APIXABAN 2.5 MG PO TABS: 2.5000 mg | ORAL_TABLET | Freq: Two times a day (BID) | ORAL | 5 refills | Status: AC

## 2023-10-11 NOTE — Progress Notes (Signed)
 Chief Complaint:  [name] Good is a 73 y.o. female who presents today for a TCM visit.  Assessment/Plan:  Chronic Problems Addressed Today: GI bleed She has follow-up with GI soon.  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   Iron deficiency anemia Recheck CBC today.  Instructed patient to start iron supplementation ferrous sulfate 65 mg every other day on an empty stomach.  Mesenteric thrombosis (HCC) Had a lengthy discussion with patient and her daughter today regarding her anticoagulation.  She wishes to stay off of warfarin due to recent episodes of supratherapeutic INR a couple of months ago as well as her recent GI bleed.  We did discuss importance of maintaining indefinite anticoagulation due to her previous history of mesenteric thrombosis and she is agreeable to this.  She was previously on Eliquis  and did well with this however had to switch due to insurance issues.  She is currently looking into getting on an assistance program for this.  Will also place a referral to our clinical pharmacist to see if they can assist with this.  Will restart her Eliquis .  We gave sample today in office for 5 mg twice daily for the next week while we are awaiting her financial assistance though will likely be able to transition to prophylactic dose of 2.5 mg daily once her initial assistance program becomes approved.  She is aware of warning signs for bleeding.  We discussed reasons to return to care and seek emergent care.     Subjective:  HPI:  Summary of Hospital admission: Reason for admission: Anemia Date of admission: 10/01/2023 Date of discharge: 10/06/2023 Date of Interactive contact: 10/07/2023 Summary of Hospital course: Patient presented to the ED on 10/01/2023 after being seen here and having labs which showed critical hemoglobin of 5.8.  In the ED her stool was guaiac positive and she was admitted with GI consultation.  She underwent EGD which showed 2 small AVMs which were  treated with APC.  She was subsequently discharged home in stable condition.  Interim history:  She has done well since being home.  Energy levels and dizziness have improved.  She has not had any other obvious signs of bleeding.  She has not been on any anticoagulation since being discharged from the hospital.  She is not currently taking warfarin and does not wish to go back on this due to recent issues with bleeding.  She has been compliant with her other medications.  She would like to go back to Eliquis  however is currently looking again to getting into a financial assistance program for this.   ROS: Per HPI, otherwise a complete review of systems was negative.   PMH:  The following were reviewed and entered/updated in epic: Past Medical History:  Diagnosis Date   Anxiety    Arthritis    Depression    GERD (gastroesophageal reflux disease)    Myocardial infarction (HCC) 89   no visits to cardiac dr(thomas kelly) since 89   Stroke (HCC)    Wound dehiscence    lumbar   Patient Active Problem List   Diagnosis Date Noted   Iron deficiency anemia 10/04/2023   Dermatitis 10/01/2023   GI bleed 10/01/2023   OA (osteoarthritis) of knee 08/31/2023   Status post total left knee replacement 08/31/2023   Senile purpura (HCC) 08/05/2023   Supratherapeutic INR 07/05/2023   Unilateral primary osteoarthritis, right knee 06/09/2023   Vitamin D deficiency 02/16/2023   Dyslipidemia associated with type 2 diabetes mellitus (  HCC) 01/01/2023   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   History of CVA (cerebrovascular accident) 12/07/2022   T2DM (type 2 diabetes mellitus) (HCC) 08/11/2022   Chronic pain syndrome 10/30/2021   Morbid obesity (HCC) 09/02/2021   Depression, major, single episode, moderate (HCC) 07/15/2021   Vertebral artery stenosis 07/15/2021   Cerebrovascular disease 07/15/2021   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   RLS (restless legs syndrome) 10/02/2019   Arthritis  10/18/2017   Former smoker 11/13/2016   Autoimmune disease (HCC) 08/12/2016   Obesity (BMI 35.0-39.9 without comorbidity) 08/12/2016   Status post total replacement of right hip 10/25/2015   S/P lumbar laminectomy 06/14/2015   Past Surgical History:  Procedure Laterality Date   APPENDECTOMY     17 yrs   BACK SURGERY     BREAST SURGERY Left    cyst   BUBBLE STUDY  03/12/2020   Procedure: BUBBLE STUDY;  Surgeon: Pasqual Bone, MD;  Location: [facility];  Service: Cardiovascular;;   CARDIAC CATHETERIZATION  6   CHOLECYSTECTOMY     73 yrs old   ESOPHAGOGASTRODUODENOSCOPY N/A 10/05/2023   Procedure: EGD (ESOPHAGOGASTRODUODENOSCOPY);  Surgeon: Annis Kinder, DO;  Location: [facility];  Service: Gastroenterology;  Laterality: N/A;   HOT HEMOSTASIS N/A 10/05/2023   Procedure: EGD, WITH ARGON PLASMA COAGULATION;  Surgeon: Annis Kinder, DO;  Location: [facility];  Service: Gastroenterology;  Laterality: N/A;   LUMBAR LAMINECTOMY/DECOMPRESSION MICRODISCECTOMY N/A 06/14/2015   Procedure: Lumbar Three-Four,Lumbar Four-Five, Lumbar Five-Sacral One Laminectomy;  Surgeon: Isadora Mar, MD;  Location: [facility];  Service: Neurosurgery;  Laterality: N/A;   LUMBAR WOUND DEBRIDEMENT N/A 07/24/2015   Procedure: lumbar wound revision;  Surgeon: Isadora Mar, MD;  Location: [facility];  Service: Neurosurgery;  Laterality: N/A;   TEE WITHOUT CARDIOVERSION N/A 03/12/2020   Procedure: TRANSESOPHAGEAL ECHOCARDIOGRAM (TEE);  Surgeon: Pasqual Bone, MD;  Location: [facility];  Service: Cardiovascular;  Laterality: N/A;   TOTAL HIP ARTHROPLASTY Right 10/25/2015   Procedure: RIGHT TOTAL HIP ARTHROPLASTY ANTERIOR APPROACH;  Surgeon: Arnie Lao, MD;  Location: [facility];  Service: Orthopedics;  Laterality: Right;   TOTAL KNEE ARTHROPLASTY Left 08/31/2023   Procedure: ARTHROPLASTY, KNEE, TOTAL;  Surgeon: Arnie Lao, MD;  Location: [facility] OR;  Service: Orthopedics;  Laterality:  Left;   TUBAL LIGATION      Family History  Problem Relation Age of Onset   Stroke Mother    Clotting disorder Neg Hx     Medications- Reconciled discharge and current medications in Epic.  Current Outpatient Medications  Medication Sig Dispense Refill   apixaban  (ELIQUIS ) 2.5 MG TABS tablet Take 1 tablet (2.5 mg total) by mouth 2 (two) times daily. 60 tablet 5   atorvastatin  (LIPITOR ) 80 MG tablet Take 1 tablet (80 mg total) by mouth daily. 90 tablet 3   baclofen  (LIORESAL ) 10 MG tablet Take 1 tablet (10 mg total) by mouth 3 (three) times daily as needed for muscle spasms. 30 tablet 5   calcium  carbonate (TUMS EX) 750 MG chewable tablet Chew 1 tablet by mouth daily as needed for heartburn.     diphenhydrAMINE  (BENADRYL ) 25 mg capsule Take 25 mg by mouth at bedtime.     docusate sodium  (COLACE) 100 MG capsule Take 100 mg by mouth daily as needed for mild constipation or moderate constipation.     escitalopram  (LEXAPRO ) 20 MG tablet TAKE 1 TABLET BY MOUTH DAILY 90 tablet 3   ezetimibe  (ZETIA ) 10 MG tablet Take 1 tablet (10  mg total) by mouth daily. 90 tablet 3   famotidine  (PEPCID ) 20 MG tablet Take 20 mg by mouth daily as needed for heartburn or indigestion.     oxyCODONE -acetaminophen  (PERCOCET) 10-325 MG tablet Take 1 tablet by mouth 5 (five) times daily. 150 tablet 0   pantoprazole  (PROTONIX ) 40 MG tablet Take 1 tablet (40 mg total) by mouth 2 (two) times daily before a meal. 60 tablet 1   phentermine  (ADIPEX-P ) 37.5 MG tablet TAKE 1 TABLET BY MOUTH EVERY MORNING 30 tablet 2   predniSONE  (DELTASONE ) 5 MG tablet TAKE 1 TABLET BY MOUTH DAILY 90 tablet 3   pregabalin  (LYRICA ) 25 MG capsule Take 1 capsule (25 mg total) by mouth 2 (two) times daily. 60 capsule 0   rOPINIRole  (REQUIP ) 3 MG tablet Take 3 mg by mouth daily.     triamcinolone  ointment (KENALOG ) 0.5 % Apply 1 Application topically 2 (two) times daily. 30 g 0   Vitamin D, Ergocalciferol, (DRISDOL) 1.25 MG (50000 UNIT) CAPS  capsule Take 1 capsule (50,000 Units total) by mouth every 7 (seven) days. 12 capsule 0   clopidogrel  (PLAVIX ) 75 MG tablet Take 1 tablet (75 mg total) by mouth daily. 90 tablet 1   No current facility-administered medications for this visit.    Allergies-reviewed and updated Allergies  Allergen Reactions   Amoxicillin Other (See Comments)    Tolerated Zosyn  Has patient had a PCN reaction causing immediate rash, facial/tongue/throat swelling, SOB or lightheadedness with hypotension: No Has patient had a PCN reaction causing severe rash involving mucus membranes or skin necrosis: No Has patient had a PCN reaction that required hospitalization No Has patient had a PCN reaction occurring within the last 10 years: No If all of the above answers are NO, then may proceed with Cephalosporin use.    Social History   Socioeconomic History   Marital status: Married    Spouse name: Maryan Sivak   Number of children: 4   Years of education: GED   Highest education level: GED or equivalent  Occupational History   Occupation: retired   Tobacco Use   Smoking status: Former    Current packs/day: 0.00    Average packs/day: 2.0 packs/day for 40.0 years (80.0 ttl pk-yrs)    Types: Cigarettes    Start date: 04/10/1973    Quit date: 04/10/2013    Years since quitting: 10.5   Smokeless tobacco: Never  Vaping Use   Vaping status: Every Day   Start date: 04/11/2013   Substances: Nicotine, Nicotine-salt  Substance and Sexual Activity   Alcohol use: Not Currently   Drug use: No   Sexual activity: Not Currently  Other Topics Concern   Not on file  Social History Narrative   Lives with husband   Right Handed   Drinks1-2 cups caffeine daily   Social Drivers of Health   Financial Resource Strain: Low Risk  (11/05/2022)   Overall Financial Resource Strain (CARDIA)    Difficulty of Paying Living Expenses: Not hard at all  Food Insecurity: No Food Insecurity (10/07/2023)   Hunger Vital Sign     Worried About Running Out of Food in the Last Year: Never true    Ran Out of Food in the Last Year: Never true  Transportation Needs: No Transportation Needs (10/07/2023)   PRAPARE - Administrator, Civil Service (Medical): No    Lack of Transportation (Non-Medical): No  Physical Activity: Inactive (11/05/2022)   Exercise Vital Sign    Days of  Exercise per Week: 0 days    Minutes of Exercise per Session: 0 min  Stress: No Stress Concern Present (11/05/2022)   [employer] of Occupational Health - Occupational Stress Questionnaire    Feeling of Stress : Only a little  Social Connections: Moderately Isolated (10/01/2023)   Social Connection and Isolation Panel    Frequency of Communication with Friends and Family: More than three times a week    Frequency of Social Gatherings with Friends and Family: Once a week    Attends Religious Services: Never    Database administrator or Organizations: No    Attends Engineer, structural: Never    Marital Status: Married        Objective:  Physical Exam: BP 118/70   Pulse 98   Temp 97.7 F (36.5 C) (Temporal)   Ht 5' 3 (1.6 m)   Wt 188 lb (85.3 kg)   SpO2 98%   BMI 33.30 kg/m   Gen: NAD, resting comfortably CV: RRR with no murmurs appreciated Pulm: NWOB, CTAB with no crackles, wheezes, or rhonchi GI: Normal bowel sounds present. Soft, Nontender, Nondistended. MSK: No edema, cyanosis, or clubbing noted Skin: Warm, dry Neuro: Grossly normal, moves all extremities Psych: Normal affect and thought content  Time Spent: 45 minutes of total time was spent on the date of the encounter performing the following actions: chart review prior to seeing the patient, obtaining history, performing a medically necessary exam, counseling on the treatment plan, placing orders, and documenting in our EHR.         Jinny Mounts. Daneil Dunker, MD 10/11/2023 10:57 AM

## 2023-10-11 NOTE — Progress Notes (Signed)
 Resolving anticoagulation encounters. Pt is transitioning to Eliquis .

## 2023-10-11 NOTE — Telephone Encounter (Signed)
 Pt had a hospital f/u with PCP today. Pt was admitted for critical hemoglobin and found to have a GI bleed. Indication for anticoagulation is APS. Eliquis  not recommended for this diagnosis. PCP advised pt of recommendation for anticoagulation at the OV. Pt reported at OV with PCP she no longer wants to continue warfarin and is open to restarting Eliquis . PCP is working with pharmacist to inquire if pt could obtain any financial assistance for this.  Resolved anticoagulation encounters.

## 2023-10-11 NOTE — Patient Instructions (Signed)
 It was very nice to see you today!  We will check blood work today.  Please start over-the-counter iron supplementation ferrous sulfate 65 mg every other day on an empty stomach.  We will restart your Eliquis .  We gave you a sample today of 5 mg tablets.  Please take twice daily for the next week.  I will send a prescription in for 2.5 mg daily.  I will refer you to our clinical pharmacist to help with medication assistance.  Return in about 3 months (around 01/11/2024) for Follow Up.   Take care, Dr Daneil Dunker  PLEASE NOTE:  If you had any lab tests, please let us  know if you have not heard back within a few days. You may see your results on mychart before we have a chance to review them but we will give you a call once they are reviewed by us .   If we ordered any referrals today, please let us  know if you have not heard from their office within the next week.   If you had any urgent prescriptions sent in today, please check with the pharmacy within an hour of our visit to make sure the prescription was transmitted appropriately.   Please try these tips to maintain a healthy lifestyle:  Eat at least 3 REAL meals and 1-2 snacks per day.  Aim for no more than 5 hours between eating.  If you eat breakfast, please do so within one hour of getting up.   Each meal should contain half fruits/vegetables, one quarter protein, and one quarter carbs (no bigger than a computer mouse)  Cut down on sweet beverages. This includes juice, soda, and sweet tea.   Drink at least 1 glass of water with each meal and aim for at least 8 glasses per day  Exercise at least 150 minutes every week.

## 2023-10-11 NOTE — Assessment & Plan Note (Signed)
 Had a lengthy discussion with patient and her daughter today regarding her anticoagulation.  She wishes to stay off of warfarin due to recent episodes of supratherapeutic INR a couple of months ago as well as her recent GI bleed.  We did discuss importance of maintaining indefinite anticoagulation due to her previous history of mesenteric thrombosis and she is agreeable to this.  She was previously on Eliquis  and did well with this however had to switch due to insurance issues.  She is currently looking into getting on an assistance program for this.  Will also place a referral to our clinical pharmacist to see if they can assist with this.  Will restart her Eliquis .  We gave sample today in office for 5 mg twice daily for the next week while we are awaiting her financial assistance though will likely be able to transition to prophylactic dose of 2.5 mg daily once her initial assistance program becomes approved.  She is aware of warning signs for bleeding.  We discussed reasons to return to care and seek emergent care.

## 2023-10-11 NOTE — Assessment & Plan Note (Addendum)
 She has follow-up with GI soon.  No signs of GI bleeding since hospitalization.  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

## 2023-10-11 NOTE — Assessment & Plan Note (Signed)
 Recheck CBC today.  Instructed patient to start iron supplementation ferrous sulfate 65 mg every other day on an empty stomach.

## 2023-10-12 ENCOUNTER — Telehealth: Payer: Self-pay | Admitting: *Deleted

## 2023-10-12 ENCOUNTER — Ambulatory Visit: Payer: Self-pay | Admitting: Family Medicine

## 2023-10-12 LAB — CBC
HCT: 28.6 % — ABNORMAL LOW (ref 35.0–45.0)
Hemoglobin: 8.7 g/dL — ABNORMAL LOW (ref 11.7–15.5)
MCH: 27 pg (ref 27.0–33.0)
MCHC: 30.4 g/dL — ABNORMAL LOW (ref 32.0–36.0)
MCV: 88.8 fL (ref 80.0–100.0)
MPV: 9.7 fL (ref 7.5–12.5)
Platelets: 246 10*3/uL (ref 140–400)
RBC: 3.22 10*6/uL — ABNORMAL LOW (ref 3.80–5.10)
RDW: 14.9 % (ref 11.0–15.0)
WBC: 5.7 10*3/uL (ref 3.8–10.8)

## 2023-10-12 LAB — IRON,TIBC AND FERRITIN PANEL
%SAT: 4 % — ABNORMAL LOW (ref 16–45)
Ferritin: 64 ng/mL (ref 16–288)
Iron: 13 ug/dL — ABNORMAL LOW (ref 45–160)
TIBC: 345 ug/dL (ref 250–450)

## 2023-10-12 NOTE — Progress Notes (Signed)
 Her blood counts are slowly improving.  We should recheck in 3 to 6 months.  She should continue her iron supplementation.

## 2023-10-12 NOTE — Progress Notes (Signed)
 Care Guide Pharmacy Note  10/12/2023 Name: [name] Good MRN: [id number] DOB: 15-Dec-1950  Referred By: Rodney Clamp, MD Reason for referral: Complex Care Management (Outreach to schedule referral with pharmacist )   [name] Good is a 73 y.o. year old female who is a primary care patient of Rodney Clamp, MD.  [name] Good was referred to the pharmacist for assistance related to: Eliquis    Successful contact was made with the patient to discuss pharmacy services including being ready for the pharmacist to call at least 5 minutes before the scheduled appointment time and to have medication bottles and any blood pressure readings ready for review. The patient agreed to meet with the pharmacist via telephone visit on 10/15/2023  Kandis Ormond, CMA [place]  [facility], [facility] Guide Direct Dial: [phone]  Fax: [phone] Website: [email or web address]

## 2023-10-13 ENCOUNTER — Encounter: Admitting: Orthopaedic Surgery

## 2023-10-13 ENCOUNTER — Encounter: Payer: Self-pay | Admitting: Orthopaedic Surgery

## 2023-10-13 ENCOUNTER — Ambulatory Visit (INDEPENDENT_AMBULATORY_CARE_PROVIDER_SITE_OTHER): Admitting: Orthopaedic Surgery

## 2023-10-13 ENCOUNTER — Telehealth: Payer: Self-pay

## 2023-10-13 DIAGNOSIS — Z96652 Presence of left artificial knee joint: Secondary | ICD-10-CM

## 2023-10-13 MED ORDER — OXYCODONE-ACETAMINOPHEN 5-325 MG PO TABS: 1.0000 | ORAL_TABLET | Freq: Four times a day (QID) | ORAL | 0 refills | Status: DC | PRN

## 2023-10-13 NOTE — Telephone Encounter (Signed)
 Ernestina Headland from [facility] called to notify Dr Lucienne Ryder that patient gets Perocet 10/325 mg 150 from another pharmacy, the provider is Valdene Garret.. Do you still want want them fill the rx sent in today for her?

## 2023-10-13 NOTE — Progress Notes (Signed)
 The patient comes in today at over 6 weeks status post a left total knee arthroplasty.  Her postoperative course was complicated by her chronic blood thinning medications that caused her to have believe a bleeding ulcer.  She is now on low-dose Eliquis  twice daily.  She was on Coumadin  for a long period of time even prior to surgery.  She is 73 years old.  [facility] and then has had some home therapy.  She would really benefit now from outpatient physical therapy on her left total knee.  Exam today her extension is almost full and I can flex her to barely past 90 degrees with that left knee.  I will send in some Percocet for pain and I want her to try to wean from this however she has been on chronic narcotics for some time now.  We will see about setting her up for outpatient physical therapy upstairs here for working on range of motion as well as strengthening and balance and coordination.  Will then see her back in a month to see how she is doing overall but no x-rays are needed.

## 2023-10-13 NOTE — Addendum Note (Signed)
 Addended by: Clancy Crimes B on: 10/13/2023 05:14 PM   Modules accepted: Orders

## 2023-10-14 ENCOUNTER — Other Ambulatory Visit: Payer: Self-pay | Admitting: Family Medicine

## 2023-10-14 NOTE — Telephone Encounter (Signed)
 Pharmacy aware

## 2023-10-15 ENCOUNTER — Other Ambulatory Visit: Payer: Self-pay | Admitting: Pharmacist

## 2023-10-15 MED ORDER — ATORVASTATIN CALCIUM 80 MG PO TABS: 80.0000 mg | ORAL_TABLET | Freq: Every day | ORAL | 1 refills | Status: AC

## 2023-10-15 NOTE — Progress Notes (Signed)
 10/15/2023 Name: [name] Good MRN: [id number] DOB: [birth year]  Chief Complaint  Patient presents with   Medication Management    [name] Good is a 73 y.o. year old female who presented for a telephone visit.   They were referred to the pharmacist by their PCP for assistance in managing medication access.    Subjective:  Medication Access/Adherence  Current Pharmacy:  [facility] - [place], [place] - [address] [place] [address] Phone: [phone] Fax: [phone]  [facility] [address], [place] - [address] 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 Warfarin was stopped and Dr Daneil Dunker started Eliquis  2.5mg  twice a day but patient is worried about what Eliquis  will cost. She has taken Eliquis  in the past but she stopped due to cost of >$200 / month.  She is currently taking Eliquis  5mg  twice a day because that was the only dose of samples but plan is to change to 2.5mg  twice a day.   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 Usually Medicare patients have to spend 3% of their income on medications in the current calendar year to be approved for Eliquis  medication assistance program but per patient this was not mentioned by the [employer].   Also reviewed her medication list and refill history. Looks like she should be due to refill atorvastatin  but per patient she has some at  home and she is taking every day. Verified with [facility] her last refill for atorvastatin  was for 90 days on 04/09/2023.  Objective:  Lab Results  Component Value Date   HGBA1C 4.6 10/01/2023    Lab Results  Component Value Date   CREATININE 0.58 10/06/2023   BUN 9 10/06/2023   NA 137 10/06/2023   K 3.9 10/06/2023   CL 107 10/06/2023   CO2 24 10/06/2023    Lab Results  Component Value Date   CHOL 189 08/11/2022   HDL 53.60 08/11/2022   LDLCALC 116 (H) 08/11/2022   TRIG 95.0 08/11/2022   CHOLHDL 4 08/11/2022    Medications Reviewed Today     Reviewed by Cecilie Coffee, RPH-CPP (Pharmacist) on 10/15/23 at 1257  Med List Status: <None>   Medication Order Taking? Sig Documenting Provider Last Dose Status Informant  apixaban  (ELIQUIS ) 2.5 MG TABS tablet [id number]  Take 1 tablet (2.5 mg total) by mouth 2 (two) times daily.  Patient not taking: Reported on 10/15/2023   Rodney Clamp, MD  Active   atorvastatin  (LIPITOR ) 80 MG tablet [id number] Yes Take 1 tablet (80 mg total) by mouth daily. Rodney Clamp, MD  Active Self, Pharmacy Records           Med Note (CRUTHIS, CHLOE C   Tue Aug 24, 2023 11:34 AM) Pt is adamant she is still taking this medication daily. Dispense report does not support this claim.   baclofen  (LIORESAL ) 10 MG tablet [id number] Yes Take 1 tablet (10 mg total) by mouth 3 (three) times daily as needed  for muscle spasms. Arnie Lao, MD  Active Self, Pharmacy Records  calcium  carbonate (TUMS EX) 750 MG chewable tablet [id number] Yes Chew 1 tablet by mouth daily as needed for heartburn. [provider]  Active Self, Pharmacy Records           Med Note (CRUTHIS, CHLOE C   Tue Aug 24, 2023 11:30 AM)    clopidogrel  (PLAVIX ) 75 MG tablet [id number] Yes Take 1 tablet (75 mg total) by mouth daily. Rodney Clamp, MD  Active Self, Pharmacy Records  diphenhydrAMINE  (BENADRYL ) 25 mg capsule [id number] Yes Take 25 mg by mouth at bedtime. [provider]  Active Self, Pharmacy Records  docusate sodium  (COLACE) 100 MG capsule [id number] Yes Take 100 mg by mouth daily as needed for mild constipation or moderate constipation. [provider]  Active Self, Pharmacy Records  escitalopram  (LEXAPRO ) 20 MG tablet [id number] Yes TAKE 1 TABLET BY MOUTH DAILY Rodney Clamp, MD  Active Self, Pharmacy Records  ezetimibe  (ZETIA ) 10 MG tablet [id number] Yes Take 1 tablet (10 mg total) by mouth daily. Rodney Clamp, MD  Active Self, Pharmacy Records  famotidine  (PEPCID ) 20 MG tablet [id number] Yes Take 20 mg by mouth daily as needed for heartburn or indigestion. [provider]  Active Self, Pharmacy Records  oxyCODONE -acetaminophen  (PERCOCET/ROXICET) 5-325 MG tablet [id number] Yes Take 1 tablet by mouth every 6 (six) hours as needed for severe pain (pain score 7-10). Arnie Lao, MD  Active   pantoprazole  (PROTONIX ) 40 MG tablet [id number] Yes Take 1 tablet (40 mg total) by mouth 2 (two) times daily before a meal. Daren Eck, DO  Active   phentermine  (ADIPEX-P ) 37.5 MG tablet [id number]  TAKE 1 TABLET BY MOUTH EVERY MORNING  Patient not taking: Reported on 10/15/2023   Rodney Clamp, MD  Active Self, Pharmacy Records  predniSONE  (DELTASONE ) 5 MG tablet [id number] Yes TAKE 1 TABLET BY MOUTH DAILY Rodney Clamp, MD  Active Self, Pharmacy Records  pregabalin  (LYRICA ) 25 MG capsule [id number] Yes Take 1 capsule (25 mg total) by mouth 2 (two) times daily. Rodney Clamp, MD  Active Self, Pharmacy Records           Med Note [facility] [place] [facility]   Fri Oct 15, 2023 12:49 PM)    rOPINIRole  (REQUIP ) 3 MG tablet [id number] Yes TAKE 1 TABLET BY MOUTH AT BEDTIME AS NEEDED Rodney Clamp, MD  Active   triamcinolone  ointment (KENALOG ) 0.5 % [id number] Yes Apply 1 Application topically 2 (two) times daily. Rodney Clamp, MD  Active Self, Pharmacy Records  Vitamin D, Ergocalciferol, (DRISDOL) 1.25 MG (50000 UNIT) CAPS capsule [id number]  Yes Take 1 capsule (50,000 Units total) by mouth every 7 (seven) days. Rodney Clamp, MD  Active               Assessment/Plan:   Medication Management: - reviewed her [facility] 2025 benefits. She has a $255 deductible which it appears she has met for 2025. Per [facility] her copay for Eliquis  2.5mg  is $47 / month. Patient is agreeable to this cost.  - Patient asked for updated Rx for atorvastatin  be sent to [facility] - but asked for it to be profile until she needs it filled. Rx sent.Reminded her about importance of taking cholesterol medication every day.   Follow Up Plan: patient to reach out to Clinical Pharmacist with any further medication questions or needs.   Cecilie Coffee, PharmD Clinical Pharmacist  [facility]  Population Health [phone]

## 2023-10-18 ENCOUNTER — Encounter: Admitting: Orthopaedic Surgery

## 2023-10-23 ENCOUNTER — Encounter (HOSPITAL_COMMUNITY): Payer: Self-pay | Admitting: Interventional Radiology

## 2023-10-25 ENCOUNTER — Other Ambulatory Visit: Payer: Self-pay | Admitting: Family Medicine

## 2023-10-27 ENCOUNTER — Ambulatory Visit: Admitting: Rehabilitative and Restorative Service Providers"

## 2023-10-27 ENCOUNTER — Encounter: Payer: Self-pay | Admitting: Rehabilitative and Restorative Service Providers"

## 2023-10-27 DIAGNOSIS — M25562 Pain in left knee: Secondary | ICD-10-CM

## 2023-10-27 DIAGNOSIS — M6281 Muscle weakness (generalized): Secondary | ICD-10-CM | POA: Diagnosis not present

## 2023-10-27 DIAGNOSIS — M25662 Stiffness of left knee, not elsewhere classified: Secondary | ICD-10-CM | POA: Diagnosis not present

## 2023-10-27 DIAGNOSIS — R262 Difficulty in walking, not elsewhere classified: Secondary | ICD-10-CM | POA: Diagnosis not present

## 2023-10-27 DIAGNOSIS — R600 Localized edema: Secondary | ICD-10-CM | POA: Diagnosis not present

## 2023-10-27 NOTE — Therapy (Signed)
 OUTPATIENT PHYSICAL THERAPY LOWER EXTREMITY EVALUATION  Date of referral: 10/13/2023 Referring provider: Lonni CINDERELLA Poli, MD Referring diagnosis?  Diagnosis  Z96.652 (ICD-10-CM) - Status post total left knee replacement   Treatment diagnosis? (if different than referring diagnosis) R26.2   R60.0   M62.81   M25.662   M25.562  What was this (referring dx) caused by? Surgery (Type: TKA) and Arthritis  Nature of Condition: Initial Onset (within last 3 months)   Laterality: Lt  Current Functional Measure Score: Patient Specific Functional Scale 3.5  Objective measurements identify impairments when they are compared to normal values, the uninvolved extremity, and prior level of function.  [x]  Yes  []  No  Objective assessment of functional ability: Severe functional limitations   Briefly describe symptoms: Walking with a cane, poor sleep, poor function post total knee replacement  How did symptoms start: Osteoarthritis  Average pain intensity:  Last 24 hours: 0-5/10  Past week: 0-5/10  How often does the pt experience symptoms? Constantly  How much have the symptoms interfered with usual daily activities? Quite a bit  How has condition changed since care began at this facility? NA - initial visit  In general, how is the patients overall health? Good   BACK PAIN (STarT Back Screening Tool) No   Patient Name: JHANA GIARRATANO MRN: [id number] DOB:08-14-1950, 73 y.o., female Today's Date: 10/27/2023  END OF SESSION:  PT End of Session - 10/27/23 1755     Visit Number 1    Number of Visits 24    Date for PT Re-Evaluation 12/22/23    Authorization Type U[employer] Medicare    Progress Note Due on Visit 10    PT Start Time 1147    PT Stop Time 1238    PT Time Calculation (min) 51 min    Activity Tolerance Patient tolerated treatment well;No increased pain;Patient limited by pain    Behavior During Therapy [facility] for tasks assessed/performed          Past Medical History:   Diagnosis Date   Anxiety    Arthritis    Depression    GERD (gastroesophageal reflux disease)    Myocardial infarction (HCC) 89   no visits to cardiac dr(thomas kelly) since 89   Stroke (HCC)    Wound dehiscence    lumbar   Past Surgical History:  Procedure Laterality Date   APPENDECTOMY     17 yrs   BACK SURGERY     BREAST SURGERY Left    cyst   BUBBLE STUDY  03/12/2020   Procedure: BUBBLE STUDY;  Surgeon: Claudene Pacific, MD;  Location: [facility] END[facility]COPY;  Service: Cardiovascular;;   CARDIAC CATHETERIZATION  24   CHOLECYSTECTOMY     73 yrs old   ESOPHAGOGASTRODUODENOSCOPY N/A 10/05/2023   Procedure: EGD (ESOPHAGOGASTRODUODENOSCOPY);  Surgeon: San Sandor GAILS, DO;  Location: [facility] END[facility]COPY;  Service: Gastroenterology;  Laterality: N/A;   HOT HEMOSTASIS N/A 10/05/2023   Procedure: EGD, WITH ARGON PLASMA COAGULATION;  Surgeon: San Sandor GAILS, DO;  Location: [facility]COPY;  Service: Gastroenterology;  Laterality: N/A;   LUMBAR LAMINECTOMY/DECOMPRESSION MICRODISCECTOMY N/A 06/14/2015   Procedure: Lumbar Three-Four,Lumbar Four-Five, Lumbar Five-Sacral One Laminectomy;  Surgeon: Alm GORMAN Molt, MD;  Location: [facility]URO ORS;  Service: Neurosurgery;  Laterality: N/A;   LUMBAR WOUND DEBRIDEMENT N/A 07/24/2015   Procedure: lumbar wound revision;  Surgeon: Alm GORMAN Molt, MD;  Location: [facility];  Service: Neurosurgery;  Laterality: N/A;   TEE WITHOUT CARDIOVERSION N/A 03/12/2020   Procedure: TRANSESOPHAGEAL ECHOCARDIOGRAM (TEE);  Surgeon: Claudene Pacific, MD;  Location: [facility] ENDOSCOPY;  Service: Cardiovascular;  Laterality: N/A;   TOTAL HIP ARTHROPLASTY Right 10/25/2015   Procedure: RIGHT TOTAL HIP ARTHROPLASTY ANTERIOR APPROACH;  Surgeon: Lonni CINDERELLA Poli, MD;  Location: [facility] ORS;  Service: Orthopedics;  Laterality: Right;   TOTAL KNEE ARTHROPLASTY Left 08/31/2023   Procedure: ARTHROPLASTY, KNEE, TOTAL;  Surgeon: Poli Lonni CINDERELLA, MD;  Location: [facility] OR;  Service: Orthopedics;  Laterality:  Left;   TUBAL LIGATION     Patient Active Problem List   Diagnosis Date Noted   Iron deficiency anemia 10/04/2023   Dermatitis 10/01/2023   GI bleed 10/01/2023   OA (osteoarthritis) of knee 08/31/2023   Status post total left knee replacement 08/31/2023   Senile purpura (HCC) 08/05/2023   Supratherapeutic INR 07/05/2023   Unilateral primary osteoarthritis, right knee 06/09/2023   Vitamin D  deficiency 02/16/2023   Dyslipidemia associated with type 2 diabetes mellitus (HCC) 01/01/2023   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   History of CVA (cerebrovascular accident) 12/07/2022   T2DM (type 2 diabetes mellitus) (HCC) 08/11/2022   Chronic pain syndrome 10/30/2021   Morbid obesity (HCC) 09/02/2021   Depression, major, single episode, moderate (HCC) 07/15/2021   Vertebral artery stenosis 07/15/2021   Cerebrovascular disease 07/15/2021   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   RLS (restless legs syndrome) 10/02/2019   Arthritis 10/18/2017   Former smoker 11/13/2016   Autoimmune disease (HCC) 08/12/2016   Obesity (BMI 35.0-39.9 without comorbidity) 08/12/2016   Status post total replacement of right hip 10/25/2015   S/P lumbar laminectomy 06/14/2015    PCP: Worth CHRISTELLA Kitty, MD  REFERRING PROVIDER: Lonni CINDERELLA Poli, MD  REFERRING DIAG:  Diagnosis  [phone] (ICD-10-CM) - Status post total left knee replacement    THERAPY DIAG:  Difficulty in walking, not elsewhere classified - Plan: PT plan of care cert/re-cert  Localized edema - Plan: PT plan of care cert/re-cert  Muscle weakness (generalized) - Plan: PT plan of care cert/re-cert  Stiffness of left knee, not elsewhere classified - Plan: PT plan of care cert/re-cert  Acute pain of left knee - Plan: PT plan of care cert/re-cert  Rationale for Evaluation and Treatment: Rehabilitation  ONSET DATE: Aug 31, 2023  SUBJECTIVE:   SUBJECTIVE STATEMENT: Antonela notes her sleep has been difficult with Restless Leg  Syndrome and her new knee.  She is taking Oxycodone  every day.  She reports she has not had home health physical therapy in 2 weeks.  PERTINENT HISTORY: MI, stroke, back surgery (lumbar laminectomy with decompression and microdiscectomy), right THA, left TKA, former smoker and current vapor  PAIN:  Are you having pain? Yes: NPRS scale: 0-5/10 this week Pain location: Left knee Pain description: Achy, stiff, sore, sharp Aggravating factors: Sleeping, too much WB, prolonged postures Relieving factors: Ice, oxycodone   PRECAUTIONS: Back  RED FLAGS: None   WEIGHT BEARING RESTRICTIONS: No  FALLS:  Has patient fallen in last 6 months? No  LIVING ENVIRONMENT: Lives with: lives with their family and lives with their spouse Lives in: Mobile home Stairs: Needs handrail Has following equipment at home: Single point cane, Environmental consultant - 2 wheeled, and Tour manager  OCCUPATION: Retired  PLOF: Independent with basic ADLs  PATIENT GOALS: Return to painting birdhouses  NEXT MD VISIT: July 16 at 1:30  OBJECTIVE:  Note: Objective measures were completed at Evaluation unless otherwise noted.  DIAGNOSTIC FINDINGS: Interval total left knee arthroplasty without evidence of hardware failure.  PATIENT SURVEYS:  Patient specific functional scale (0 unable to  10 no difficulty) 1) Sit to standing transfers   3/10 2) Walking independently  4/10   COGNITION: Overall cognitive status: Within functional limits for tasks assessed     SENSATION: Occasionally, not now.  Fusae does note dizziness though.  EDEMA:  Noted and not objectively assessed   LOWER EXTREMITY ROM:  Active ROM Left/Right 10/27/2023   Hip flexion    Hip extension    Hip abduction    Hip adduction    Hip internal rotation    Hip external rotation    Knee flexion 103/126   Knee extension -10/-5   Ankle dorsiflexion    Ankle plantarflexion    Ankle inversion    Ankle eversion     (Blank rows = not tested)  LOWER  EXTREMITY MMT:  MMT Left/Right 10/27/2023   Hip flexion    Hip extension    Hip abduction    Hip adduction    Hip internal rotation    Hip external rotation    Knee flexion    Knee extension 2+/4-   Ankle dorsiflexion    Ankle plantarflexion    Ankle inversion    Ankle eversion     (Blank rows = not tested)  GAIT: Distance walked: 200 feet Assistive device utilized: Environmental consultant - 2 wheeled Level of assistance: Complete Independence Comments: Dareth notes she has been using an assistive device for years due to bilateral knee pain                                                                                                                                TREATMENT DATE: 10/27/2023 Supine quadriceps sets with left heel prop 2 sets of 10 for 5 seconds Seated knee AAROM (right pushes left into flexion) 5 x 10 seconds  Neuromuscular reeducation: Tandem balance in doorframe eyes open 4 x 20 seconds  02464: Reviewed examination findings, expectations at various points postsurgery and day 1 home exercise program   PATIENT EDUCATION:  Education details: See above Person educated: Patient and Child(ren) Education method: Explanation, Demonstration, Tactile cues, Verbal cues, and Handouts Education comprehension: verbalized understanding, returned demonstration, verbal cues required, tactile cues required, and needs further education  HOME EXERCISE PROGRAM: Access Code: [email or web address] URL: [email or web address] Date: 10/27/2023 Prepared by: Lamar Ivory  Exercises - Supine Quadricep Sets  - 5 x daily - 7 x weekly - 2 sets - 10 reps - 5 second hold - Seated Knee Flexion AAROM  - 3 x daily - 7 x weekly - 1 sets - 10 reps - 10 seconds hold - Tandem Stance  - 2 x daily - 7 x weekly - 1 sets - 5 reps - 20 second hold  ASSESSMENT:  CLINICAL IMPRESSION: Patient is a 72 y.o. female who was seen today for physical therapy evaluation and treatment for  Diagnosis  Z96.652  (ICD-10-CM) - Status post total left knee replacement  .  Kerie has active range of motion, quadricep  strength, gait, edema and functional impairments that will benefit from supervised physical therapy as recommended below.  OBJECTIVE IMPAIRMENTS: Abnormal gait, cardiopulmonary status limiting activity, decreased activity tolerance, decreased balance, decreased endurance, decreased knowledge of condition, difficulty walking, decreased ROM, decreased strength, increased edema, impaired perceived functional ability, obesity, and pain.   ACTIVITY LIMITATIONS: carrying, lifting, bending, standing, squatting, sleeping, stairs, transfers, bed mobility, and locomotion level  PARTICIPATION LIMITATIONS: meal prep, cleaning, shopping, and community activity  PERSONAL FACTORS: MI, stroke, back surgery (lumbar laminectomy with decompression and microdiscectomy), right THA, left TKA, former smoker and current vapor are also affecting patient's functional outcome.   REHAB POTENTIAL: Good  CLINICAL DECISION MAKING: Stable/uncomplicated  EVALUATION COMPLEXITY: Low   GOALS: Goals reviewed with patient? Yes  SHORT TERM GOALS: Target date: 11/24/2023 Belva will be independent with her day 1 home exercise program Baseline: Started 10/27/2023 Goal status: INITIAL  2.  Improve left knee active range of motion to 0 - 5 -110 degrees Baseline: 0 - 10 - 103 degrees Goal status: INITIAL   LONG TERM GOALS: Target date: 12/22/2023  Improve patient specific functional score to 5.5 Baseline: 3.5 Goal status: INITIAL  2.  Aamirah will report left knee pain consistently 0-3/10 on the visual analog scale Baseline: 0-5/10 Goal status: INITIAL  3.  Improve left knee active range of motion to 0 - 3 - 115 degrees Baseline: 0 - 10 - 103 degrees Goal status: INITIAL  4.  Improve bilateral knee strength for quadriceps to at least 4/5 MMT Baseline: See objective Goal status: INITIAL  5.  Analeese will be  independent and compliant with her long-term home maintenance exercise program at discharge Baseline: Started 10/27/2023 Goal status: INITIAL   PLAN:  PT FREQUENCY: 2-3 x a week  PT DURATION: 8 weeks  PLANNED INTERVENTIONS: 97750- Physical Performance Testing, 97110-Therapeutic exercises, 97530- Therapeutic activity, 97112- Neuromuscular re-education, 97535- Self Care, 02859- Manual therapy, [phone]- Gait training, [phone]- Electrical stimulation (unattended), 97016- Vasopneumatic device, Patient/Family education, Balance training, Stair training, Joint mobilization, and Cryotherapy  PLAN FOR NEXT SESSION: Post TKA rehabilitation with heavy emphasis on gait and balance once active range of motion is normalized   Myer LELON Ivory, PT, MPT 10/27/2023, 6:20 PM

## 2023-11-03 ENCOUNTER — Ambulatory Visit: Admitting: Rehabilitative and Restorative Service Providers"

## 2023-11-03 ENCOUNTER — Encounter: Payer: Self-pay | Admitting: Rehabilitative and Restorative Service Providers"

## 2023-11-03 DIAGNOSIS — M25662 Stiffness of left knee, not elsewhere classified: Secondary | ICD-10-CM | POA: Diagnosis not present

## 2023-11-03 DIAGNOSIS — R262 Difficulty in walking, not elsewhere classified: Secondary | ICD-10-CM | POA: Diagnosis not present

## 2023-11-03 DIAGNOSIS — M6281 Muscle weakness (generalized): Secondary | ICD-10-CM | POA: Diagnosis not present

## 2023-11-03 DIAGNOSIS — M25562 Pain in left knee: Secondary | ICD-10-CM

## 2023-11-03 DIAGNOSIS — R600 Localized edema: Secondary | ICD-10-CM

## 2023-11-03 NOTE — Therapy (Signed)
 OUTPATIENT PHYSICAL THERAPY LOWER EXTREMITY TREATMENT  Date of referral: 10/13/2023 Referring provider: Lonni CINDERELLA Poli, MD Referring diagnosis?  Diagnosis  Z96.652 (ICD-10-CM) - Status post total left knee replacement   Treatment diagnosis? (if different than referring diagnosis) R26.2   R60.0   M62.81   M25.662   M25.562  What was this (referring dx) caused by? Surgery (Type: TKA) and Arthritis  Nature of Condition: Initial Onset (within last 3 months)   Laterality: Lt  Current Functional Measure Score: Patient Specific Functional Scale 3.5  Objective measurements identify impairments when they are compared to normal values, the uninvolved extremity, and prior level of function.  [x]  Yes  []  No  Objective assessment of functional ability: Severe functional limitations   Briefly describe symptoms: Walking with a cane, poor sleep, poor function post total knee replacement  How did symptoms start: Osteoarthritis  Average pain intensity:  Last 24 hours: 0-5/10  Past week: 0-5/10  How often does the pt experience symptoms? Constantly  How much have the symptoms interfered with usual daily activities? Quite a bit  How has condition changed since care began at this facility? NA - initial visit  In general, how is the patients overall health? Good   BACK PAIN (STarT Back Screening Tool) No   Patient Name: [name] Good MRN: [id number] DOB:1951-04-18, 73 y.o., female Today's Date: 11/03/2023  END OF SESSION:  PT End of Session - 11/03/23 1434     Visit Number 2    Number of Visits 24    Date for PT Re-Evaluation 12/22/23    Authorization Type [employer] Medicare    Progress Note Due on Visit 10    PT Start Time 1433    PT Stop Time 1521    PT Time Calculation (min) 48 min    Activity Tolerance Patient tolerated treatment well;No increased pain;Patient limited by pain    Behavior During Therapy [facility] for tasks assessed/performed           Past Medical History:   Diagnosis Date   Anxiety    Arthritis    Depression    GERD (gastroesophageal reflux disease)    Myocardial infarction (HCC) 89   no visits to cardiac dr(thomas kelly) since 89   Stroke (HCC)    Wound dehiscence    lumbar   Past Surgical History:  Procedure Laterality Date   APPENDECTOMY     17 yrs   BACK SURGERY     BREAST SURGERY Left    cyst   BUBBLE STUDY  03/12/2020   Procedure: BUBBLE STUDY;  Surgeon: Claudene Pacific, MD;  Location: [facility];  Service: Cardiovascular;;   CARDIAC CATHETERIZATION  61   CHOLECYSTECTOMY     73 yrs old   ESOPHAGOGASTRODUODENOSCOPY N/A 10/05/2023   Procedure: EGD (ESOPHAGOGASTRODUODENOSCOPY);  Surgeon: [place] Sand[place] GAILS, DO;  Location: [facility];  Service: Gastroenterology;  Laterality: N/A;   HOT HEMOSTASIS N/A 10/05/2023   Procedure: EGD, WITH ARGON PLASMA COAGULATION;  Surgeon: [place] GAILS, DO;  Location: [facility];  Service: Gastroenterology;  Laterality: N/A;   LUMBAR LAMINECTOMY/DECOMPRESSION MICRODISCECTOMY N/A 06/14/2015   Procedure: Lumbar Three-Four,Lumbar Four-Five, Lumbar Five-Sacral One Laminectomy;  Surgeon: Alm GORMAN Molt, MD;  Location: [facility];  Service: Neurosurgery;  Laterality: N/A;   LUMBAR WOUND DEBRIDEMENT N/A 07/24/2015   Procedure: lumbar wound revision;  Surgeon: Alm GORMAN Molt, MD;  Location: [facility];  Service: Neurosurgery;  Laterality: N/A;   TEE WITHOUT CARDIOVERSION N/A 03/12/2020   Procedure: TRANSESOPHAGEAL ECHOCARDIOGRAM (  TEE);  Surgeon: Claudene Pacific, MD;  Location: [facility] ENDOSCOPY;  Service: Cardiovascular;  Laterality: N/A;   TOTAL HIP ARTHROPLASTY Right 10/25/2015   Procedure: RIGHT TOTAL HIP ARTHROPLASTY ANTERIOR APPROACH;  Surgeon: Lonni CINDERELLA Poli, MD;  Location: [facility] OR[facility];  Service: Orthopedics;  Laterality: Right;   TOTAL KNEE ARTHROPLASTY Left 08/31/2023   Procedure: ARTHROPLASTY, KNEE, TOTAL;  Surgeon: Poli Lonni CINDERELLA, MD;  Location: [facility] OR;  Service: Orthopedics;  Laterality:  Left;   TUBAL LIGATION     Patient Active Problem List   Diagnosis Date Noted   Iron deficiency anemia 10/04/2023   Dermatitis 10/01/2023   GI bleed 10/01/2023   OA (osteoarthritis) of knee 08/31/2023   Status post total left knee replacement 08/31/2023   Senile purpura (HCC) 08/05/2023   Supratherapeutic INR 07/05/2023   Unilateral primary osteoarthritis, right knee 06/09/2023   Vitamin D  deficiency 02/16/2023   Dyslipidemia associated with type 2 diabetes mellitus (HCC) 01/01/2023   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   History of CVA (cerebrovascular accident) 12/07/2022   T2DM (type 2 diabetes mellitus) (HCC) 08/11/2022   Chronic pain syndrome 10/30/2021   Morbid obesity (HCC) 09/02/2021   Depression, major, single episode, moderate (HCC) 07/15/2021   Vertebral artery stenosis 07/15/2021   Cerebrovascular disease 07/15/2021   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   RLS (restless legs syndrome) 10/02/2019   Arthritis 10/18/2017   Former smoker 11/13/2016   Autoimmune disease (HCC) 08/12/2016   Obesity (BMI 35.0-39.9 without comorbidity) 08/12/2016   Status post total replacement of right hip 10/25/2015   S/P lumbar laminectomy 06/14/2015    PCP: [name] CHRISTELLA Kitty, MD  REFERRING PROVIDER: Lonni CINDERELLA Poli, MD  REFERRING DIAG:  Diagnosis  [phone] (ICD-10-CM) - Status post total left knee replacement    THERAPY DIAG:  Difficulty in walking, not elsewhere classified  Muscle weakness (generalized)  Localized edema  Stiffness of left knee, not elsewhere classified  Acute pain of left knee  Rationale for Evaluation and Treatment: Rehabilitation  ONSET DATE: Aug 31, 2023  SUBJECTIVE:   SUBJECTIVE STATEMENT: [name] is having a rough time in regards to sleep.  She reports some HEP compliance.  We dicussed ice an hour before bed along with pain meds and more consistent HEP compliance.  [name] notes her sleep has been difficult with Restless Leg  Syndrome and her new knee.  She is taking Oxycodone  every day.  She reports she has not had home health physical therapy in 2 weeks.  PERTINENT HISTORY: MI, stroke, back surgery (lumbar laminectomy with decompression and microdiscectomy), right THA, left TKA, former smoker and current vapor  PAIN:  Are you having pain? Yes: NPRS scale: 0-5/10 this week Pain location: Left knee Pain description: Achy, stiff, sore, sharp Aggravating factors: Sleeping, too much WB, prolonged postures Relieving factors: Ice, oxycodone   PRECAUTIONS: Back  RED FLAGS: None   WEIGHT BEARING RESTRICTIONS: No  FALLS:  Has patient fallen in last 6 months? No  LIVING ENVIRONMENT: Lives with: lives with their family and lives with their spouse Lives in: Mobile home Stairs: Needs handrail Has following equipment at home: Single point cane, Environmental consultant - 2 wheeled, and Tour manager  OCCUPATION: Retired  PLOF: Independent with basic ADLs  PATIENT GOALS: Return to painting birdhouses  NEXT MD VISIT: July 16 at 1:30  OBJECTIVE:  Note: Objective measures were completed at Evaluation unless otherwise noted.  DIAGNOSTIC FINDINGS: Interval total left knee arthroplasty without evidence of hardware failure.  PATIENT SURVEYS:  Patient specific functional scale (0 unable  to 10 no difficulty) 1) Sit to standing transfers   3/10 2) Walking independently  4/10   COGNITION: Overall cognitive status: Within functional limits for tasks assessed     SENSATION: Occasionally, not now.  Shawndra does note dizziness though.  EDEMA:  Noted and not objectively assessed   LOWER EXTREMITY ROM:  Active ROM Left/Right 10/27/2023 Left 11/03/2023  Hip flexion    Hip extension    Hip abduction    Hip adduction    Hip internal rotation    Hip external rotation    Knee flexion 103/126 105  Knee extension -10/-5 -7  Ankle dorsiflexion    Ankle plantarflexion    Ankle inversion    Ankle eversion     (Blank rows = not  tested)  LOWER EXTREMITY MMT:  MMT Left/Right 10/27/2023   Hip flexion    Hip extension    Hip abduction    Hip adduction    Hip internal rotation    Hip external rotation    Knee flexion    Knee extension 2+/4-   Ankle dorsiflexion    Ankle plantarflexion    Ankle inversion    Ankle eversion     (Blank rows = not tested)  GAIT: Distance walked: 200 feet Assistive device utilized: Environmental consultant - 2 wheeled Level of assistance: Complete Independence Comments: Swan notes she has been using an assistive device for years due to bilateral knee pain                                                                                                                                TREATMENT DATE:  11/03/2023 Recumbent bike Seat 5 and 4 2 minutes each Quad sets with left heel prop 10 x 5 seconds Seated knee AAROM (right pushes left into flexion) 5 x 10 seconds  Functional Activities: Double Leg Press 75# full extension and slow eccentric into flexion 12 x Single Leg Press 25# full extension and slow eccentric into flexion 10 x  Neuromuscular re-education: Tandem balance 10 x 20 seconds Single leg stance/Dynamic marching 2 laps   10/27/2023 Supine quadriceps sets with left heel prop 2 sets of 10 for 5 seconds Seated knee AAROM (right pushes left into flexion) 5 x 10 seconds  Neuromuscular reeducation: Tandem balance in doorframe eyes open 4 x 20 seconds  [id number]: Reviewed examination findings, expectations at various points postsurgery and day 1 home exercise program   PATIENT EDUCATION:  Education details: See above Person educated: Patient and Child(ren) Education method: Explanation, Demonstration, Tactile cues, Verbal cues, and Handouts Education comprehension: verbalized understanding, returned demonstration, verbal cues required, tactile cues required, and needs further education  HOME EXERCISE PROGRAM: Access Code: [email or web address] URL: [email or web address] Date:  10/27/2023 Prepared by: Lamar Ivory  Exercises - Supine Quadricep Sets  - 5 x daily - 7 x weekly - 2 sets - 10 reps - 5 second hold - Seated Knee Flexion AAROM  - 3 x  daily - 7 x weekly - 1 sets - 10 reps - 10 seconds hold - Tandem Stance  - 2 x daily - 7 x weekly - 1 sets - 5 reps - 20 second hold  ASSESSMENT:  CLINICAL IMPRESSION: Alana and her daughter report was submaximal home exercise program compliance.  Despite this, [place] improve her active range of motion to 0 - 7 - 105 degrees.  I reminded Xavier her window of opportunity to get her active range of motion back, particularly extension is very limited and she will benefit greatly from her previously prescribed home exercise program.  Given difficulty with early compliance, I did not add any new activities to [place] home exercise program today.  Continue early emphasis on active range of motion, particularly extension, quadriceps strength and edema control.   Patient is a 73 y.o. female who was seen today for physical therapy evaluation and treatment for  Diagnosis  Z96.652 (ICD-10-CM) - Status post total left knee replacement  .  Kennice has active range of motion, quadricep strength, gait, edema and functional impairments that will benefit from supervised physical therapy as recommended below.  OBJECTIVE IMPAIRMENTS: Abnormal gait, cardiopulmonary status limiting activity, decreased activity tolerance, decreased balance, decreased endurance, decreased knowledge of condition, difficulty walking, decreased ROM, decreased strength, increased edema, impaired perceived functional ability, obesity, and pain.   ACTIVITY LIMITATIONS: carrying, lifting, bending, standing, squatting, sleeping, stairs, transfers, bed mobility, and locomotion level  PARTICIPATION LIMITATIONS: meal prep, cleaning, shopping, and community activity  PERSONAL FACTORS: MI, stroke, back surgery (lumbar laminectomy with decompression and microdiscectomy), right  THA, left TKA, former smoker and current vapor are also affecting patient's functional outcome.   REHAB POTENTIAL: Good  CLINICAL DECISION MAKING: Stable/uncomplicated  EVALUATION COMPLEXITY: Low   GOALS: Goals reviewed with patient? Yes  SHORT TERM GOALS: Target date: 11/24/2023 Lakyn will be independent with her day 1 home exercise program Baseline: Started 10/27/2023 Goal status: On Going 11/03/2023  2.  Improve left knee active range of motion to 0 - 5 -110 degrees Baseline: 0 - 10 - 103 degrees Goal status: On Going 11/03/2023   LONG TERM GOALS: Target date: 12/22/2023  Improve patient specific functional score to 5.5 Baseline: 3.5 Goal status: INITIAL  2.  Vaneza will report left knee pain consistently 0-3/10 on the visual analog scale Baseline: 0-5/10 Goal status: INITIAL  3.  Improve left knee active range of motion to 0 - 3 - 115 degrees Baseline: 0 - 10 - 103 degrees Goal status: INITIAL  4.  Improve bilateral knee strength for quadriceps to at least 4/5 MMT Baseline: See objective Goal status: INITIAL  5.  Lorann will be independent and compliant with her long-term home maintenance exercise program at discharge Baseline: Started 10/27/2023 Goal status: INITIAL   PLAN:  PT FREQUENCY: 2-3 x a week  PT DURATION: 8 weeks  PLANNED INTERVENTIONS: 97750- Physical Performance Testing, 97110-Therapeutic exercises, 97530- Therapeutic activity, 97112- Neuromuscular re-education, 97535- Self Care, [id number]- Manual therapy, [phone]- Gait training, [phone]- Electrical stimulation (unattended), [id number]- Vasopneumatic device, Patient/Family education, Balance training, Stair training, Joint mobilization, and Cryotherapy  PLAN FOR NEXT SESSION: Post TKA rehabilitation with heavy emphasis on extension active range of motion and quadriceps strength.  Progress gait and balance once active range of motion is normalized   Myer LELON Ivory, PT, MPT 11/03/2023, 5:16 PM

## 2023-11-04 ENCOUNTER — Encounter: Admitting: Rehabilitative and Restorative Service Providers"

## 2023-11-04 ENCOUNTER — Ambulatory Visit: Admitting: Family Medicine

## 2023-11-05 ENCOUNTER — Ambulatory Visit: Admitting: Rehabilitative and Restorative Service Providers"

## 2023-11-05 ENCOUNTER — Encounter: Payer: Self-pay | Admitting: Rehabilitative and Restorative Service Providers"

## 2023-11-05 DIAGNOSIS — M25662 Stiffness of left knee, not elsewhere classified: Secondary | ICD-10-CM

## 2023-11-05 DIAGNOSIS — R262 Difficulty in walking, not elsewhere classified: Secondary | ICD-10-CM

## 2023-11-05 DIAGNOSIS — R600 Localized edema: Secondary | ICD-10-CM | POA: Diagnosis not present

## 2023-11-05 DIAGNOSIS — M6281 Muscle weakness (generalized): Secondary | ICD-10-CM

## 2023-11-05 DIAGNOSIS — M25562 Pain in left knee: Secondary | ICD-10-CM

## 2023-11-05 NOTE — Therapy (Signed)
 OUTPATIENT PHYSICAL THERAPY LOWER EXTREMITY TREATMENT  Date of referral: 10/13/2023 Referring provider: Lonni CINDERELLA Poli, MD Referring diagnosis?  Diagnosis  Z96.652 (ICD-10-CM) - Status post total left knee replacement   Treatment diagnosis? (if different than referring diagnosis) R26.2   R60.0   M62.81   M25.662   M25.562  What was this (referring dx) caused by? Surgery (Type: TKA) and Arthritis  Nature of Condition: Initial Onset (within last 3 months)   Laterality: Lt  Current Functional Measure Score: Patient Specific Functional Scale 3.5  Objective measurements identify impairments when they are compared to normal values, the uninvolved extremity, and prior level of function.  [x]  Yes  []  No  Objective assessment of functional ability: Severe functional limitations   Briefly describe symptoms: Walking with a cane, poor sleep, poor function post total knee replacement  How did symptoms start: Osteoarthritis  Average pain intensity:  Last 24 hours: 0-5/10  Past week: 0-5/10  How often does the pt experience symptoms? Constantly  How much have the symptoms interfered with usual daily activities? Quite a bit  How has condition changed since care began at this facility? NA - initial visit  In general, how is the patients overall health? Good   BACK PAIN (STarT Back Screening Tool) No   Patient Name: [name] Good Good MRN: [id number] DOB:Nov 16, 1950, 73 y.o., female Today's Date: 11/05/2023  END OF SESSION:  PT End of Session - 11/05/23 1358     Visit Number 3    Number of Visits 24    Date for PT Re-Evaluation 12/22/23    Authorization Type [employer] Medicare    Progress Note Due on Visit 10    PT Start Time 1351    PT Stop Time 1445    PT Time Calculation (min) 54 min    Equipment Utilized During Treatment Gait belt    Activity Tolerance Patient tolerated treatment well;No increased pain;Patient limited by pain    Behavior During Therapy [facility] for tasks  assessed/performed            Past Medical History:  Diagnosis Date   Anxiety    Arthritis    Depression    GERD (gastroesophageal reflux disease)    Myocardial infarction (HCC) 89   no visits to cardiac dr(thomas kelly) since 89   Stroke (HCC)    Wound dehiscence    lumbar   Past Surgical History:  Procedure Laterality Date   APPENDECTOMY     17 yrs   BACK SURGERY     BREAST SURGERY Left    cyst   BUBBLE STUDY  03/12/2020   Procedure: BUBBLE STUDY;  Surgeon: Claudene Pacific, MD;  Location: [facility];  Service: Cardiovascular;;   CARDIAC CATHETERIZATION  4   CHOLECYSTECTOMY     73 yrs old   ESOPHAGOGASTRODUODENOSCOPY N/A 10/05/2023   Procedure: EGD (ESOPHAGOGASTRODUODENOSCOPY);  Surgeon: San Sandor GAILS, DO;  Location: [facility] ENDOS[facility];  Service: Gastroenterology;  Laterality: N/A;   HOT HEMOSTASIS N/A 10/05/2023   Procedure: EGD, WITH ARGON PLASMA COAGULATION;  Surgeon: [place] Sandor GAILS, DO;  Location: [facility]OS[facility];  Service: Gastroenterology;  Laterality: N/A;   LUMBAR LAMINECTOMY/DECOMPRESSION MICRODISCECTOMY N/A 06/14/2015   Procedure: Lumbar Three-Four,Lumbar Four-Five, Lumbar Five-Sacral One Laminectomy;  Surgeon: Alm GORMAN Molt, MD;  Location: [facility]URO [facility];  Service: Neurosurgery;  Laterality: N/A;   LUMBAR WOUND DEBRIDEMENT N/A 07/24/2015   Procedure: lumbar wound revision;  Surgeon: Alm GORMAN Molt, MD;  Location: [facility];  Service: Neurosurgery;  Laterality: N/A;  TEE WITHOUT CARDIOVERSION N/A 03/12/2020   Procedure: TRANSESOPHAGEAL ECHOCARDIOGRAM (TEE);  Surgeon: Claudene Pacific, MD;  [facility] END[facility];  Service: Cardiovascular;  Laterality: N/A;   TOTAL HIP ARTHROPLASTY Right 10/25/2015   Procedure: RIGHT TOTAL HIP ARTHROPLASTY ANTERIOR APPROACH;  Surgeon: Lonni CINDERELLA Poli, MD;  Location: [facility] ORS;  Service: Orthopedics;  Laterality: Right;   TOTAL KNEE ARTHROPLASTY Left 08/31/2023   Procedure: ARTHROPLASTY, KNEE, TOTAL;  Surgeon: Poli Lonni CINDERELLA, MD;  Location: [facility] OR;  Service: Orthopedics;  Laterality: Left;   TUBAL LIGATION     Patient Active Problem List   Diagnosis Date Noted   Iron deficiency anemia 10/04/2023   Dermatitis 10/01/2023   GI bleed 10/01/2023   OA (osteoarthritis) of knee 08/31/2023   Status post total left knee replacement 08/31/2023   Senile purpura (HCC) 08/05/2023   Supratherapeutic INR 07/05/2023   Unilateral primary osteoarthritis, right knee 06/09/2023   Vitamin D  deficiency 02/16/2023   Dyslipidemia associated with type 2 diabetes mellitus (HCC) 01/01/2023   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   History of CVA (cerebrovascular accident) 12/07/2022   T2DM (type 2 diabetes mellitus) (HCC) 08/11/2022   Chronic pain syndrome 10/30/2021   Morbid obesity (HCC) 09/02/2021   Depression, major, single episode, moderate (HCC) 07/15/2021   Vertebral artery stenosis 07/15/2021   Cerebrovascular disease 07/15/2021   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   RLS (restless legs syndrome) 10/02/2019   Arthritis 10/18/2017   Former smoker 11/13/2016   Autoimmune disease (HCC) 08/12/2016   Obesity (BMI 35.0-39.9 without comorbidity) 08/12/2016   Status post total replacement of right hip 10/25/2015   S/P lumbar laminectomy 06/14/2015    PCP: Worth CHRISTELLA Kitty, MD  REFERRING PROVIDER: Lonni CINDERELLA Poli, MD  REFERRING DIAG:  Diagnosis  [phone] (ICD-10-CM) - Status post total left knee replacement    THERAPY DIAG:  Difficulty in walking, not elsewhere classified  Muscle weakness (generalized)  Localized edema  Stiffness of left knee, not elsewhere classified  Acute pain of left knee  Rationale for Evaluation and Treatment: Rehabilitation  ONSET DATE: Aug 31, 2023  SUBJECTIVE:   SUBJECTIVE STATEMENT: [name] Good Good some HEP compliance since her last PT visit.  Sleep was a bit better the last 2 nights.  Rhythm notes her sleep has been difficult with Restless Leg Syndrome and her  new knee.  She is taking Oxycodone  every day.  She Good she has not had home health physical therapy in 2 weeks.  PERTINENT HISTORY: MI, stroke, back surgery (lumbar laminectomy with decompression and microdiscectomy), right THA, left TKA, former smoker and current vapor  PAIN:  Are you having pain? Yes: NPRS scale: 0-5/10 this week Pain location: Left knee Pain description: Achy, stiff, sore, sharp Aggravating factors: Sleeping, too much WB, prolonged postures Relieving factors: Ice, oxycodone  2 x a day  PRECAUTIONS: Back  RED FLAGS: None   WEIGHT BEARING RESTRICTIONS: No  FALLS:  Has patient fallen in last 6 months? No  LIVING ENVIRONMENT: Lives with: lives with their family and lives with their spouse Lives in: Mobile home Stairs: Needs handrail Has following equipment at home: Single point cane, Environmental consultant - 2 wheeled, and Tour manager  OCCUPATION: Retired  PLOF: Independent with basic ADLs  PATIENT GOALS: Return to painting birdhouses  NEXT MD VISIT: July 16 at 1:30  OBJECTIVE:  Note: Objective measures were completed at Evaluation unless otherwise noted.  DIAGNOSTIC FINDINGS: Interval total left knee arthroplasty without evidence of hardware failure.  PATIENT SURVEYS:  Patient specific functional scale (0  unable to 10 no difficulty) 1) Sit to standing transfers   3/10 2) Walking independently  4/10   COGNITION: Overall cognitive status: Within functional limits for tasks assessed     SENSATION: Occasionally, not now.  Tanaysha does note dizziness though.  EDEMA:  Noted and not objectively assessed   LOWER EXTREMITY ROM:  Active ROM Left/Right 10/27/2023 Left 11/03/2023  Hip flexion    Hip extension    Hip abduction    Hip adduction    Hip internal rotation    Hip external rotation    Knee flexion 103/126 105  Knee extension -10/-5 -7  Ankle dorsiflexion    Ankle plantarflexion    Ankle inversion    Ankle eversion     (Blank rows = not  tested)  LOWER EXTREMITY MMT:  MMT Left/Right 10/27/2023   Hip flexion    Hip extension    Hip abduction    Hip adduction    Hip internal rotation    Hip external rotation    Knee flexion    Knee extension 2+/4-   Ankle dorsiflexion    Ankle plantarflexion    Ankle inversion    Ankle eversion     (Blank rows = not tested)  GAIT: Distance walked: 200 feet Assistive device utilized: Environmental consultant - 2 wheeled Level of assistance: Complete Independence Comments: Audianna notes she has been using an assistive device for years due to bilateral knee pain                                                                                                                              TREATMENT DATE:  11/05/2023 Recumbent bike Seat 5 and 6 3 minutes each Quad sets with left heel prop 2 sets of 10 x 5 seconds Seated knee AAROM (right pushes left into flexion) 10 x 10 seconds Seated low load, long duration knee extension stretch with 2# hanging from just above the kneecap 4 minutes  Functional Activities: Double Leg Press 87# full extension and slow eccentric into flexion 15 x Single Leg Press 31# full extension and slow eccentric into flexion 12 x  Neuromuscular re-education: Tandem balance 8 x 20 seconds Single leg stance/Dynamic marching 2 laps (tough time with single leg stance on the right-hip abductors weakness)   11/03/2023 Recumbent bike Seat 5 and 4 2 minutes each Quad sets with left heel prop 10 x 5 seconds Seated knee AAROM (right pushes left into flexion) 5 x 10 seconds  Functional Activities: Double Leg Press 75# full extension and slow eccentric into flexion 12 x Single Leg Press 25# full extension and slow eccentric into flexion 10 x  Neuromuscular re-education: Tandem balance 10 x 20 seconds Single leg stance/Dynamic marching 2 laps   10/27/2023 Supine quadriceps sets with left heel prop 2 sets of 10 for 5 seconds Seated knee AAROM (right pushes left into flexion) 5 x 10  seconds  Neuromuscular reeducation: Tandem balance in doorframe eyes open  4 x 20 seconds  02464: Reviewed examination findings, expectations at various points postsurgery and day 1 home exercise program   PATIENT EDUCATION:  Education details: See above Person educated: Patient and Child(ren) Education method: Explanation, Demonstration, Tactile cues, Verbal cues, and Handouts Education comprehension: verbalized understanding, returned demonstration, verbal cues required, tactile cues required, and needs further education  HOME EXERCISE PROGRAM: Access Code: [email or web address] URL: [email or web address] Date: 10/27/2023 Prepared by: Lamar Ivory  Exercises - Supine Quadricep Sets  - 5 x daily - 7 x weekly - 2 sets - 10 reps - 5 second hold - Seated Knee Flexion AAROM  - 3 x daily - 7 x weekly - 1 sets - 10 reps - 10 seconds hold - Tandem Stance  - 2 x daily - 7 x weekly - 1 sets - 5 reps - 20 second hold  ASSESSMENT:  CLINICAL IMPRESSION: Despite less than ideal home exercise program compliance, Stephane is doing a very good job in her supervised physical therapy visits.  Although objective measures were not taken, she had an easier time on the bike and appeared to have more extension active range of motion after passive knee extension stretching.  I again encouraged Imaan to prioritize increased home exercise program compliance, particularly with knee extension active range of motion and quadriceps strengthening activities for best possible outcome post TKA.   Patient is a 73 y.o. female who was seen today for physical therapy evaluation and treatment for  Diagnosis  Z96.652 (ICD-10-CM) - Status post total left knee replacement  .  Shalaina has active range of motion, quadricep strength, gait, edema and functional impairments that will benefit from supervised physical therapy as recommended below.  OBJECTIVE IMPAIRMENTS: Abnormal gait, cardiopulmonary status limiting activity,  decreased activity tolerance, decreased balance, decreased endurance, decreased knowledge of condition, difficulty walking, decreased ROM, decreased strength, increased edema, impaired perceived functional ability, obesity, and pain.   ACTIVITY LIMITATIONS: carrying, lifting, bending, standing, squatting, sleeping, stairs, transfers, bed mobility, and locomotion level  PARTICIPATION LIMITATIONS: meal prep, cleaning, shopping, and community activity  PERSONAL FACTORS: MI, stroke, back surgery (lumbar laminectomy with decompression and microdiscectomy), right THA, left TKA, former smoker and current vapor are also affecting patient's functional outcome.   REHAB POTENTIAL: Good  CLINICAL DECISION MAKING: Stable/uncomplicated  EVALUATION COMPLEXITY: Low   GOALS: Goals reviewed with patient? Yes  SHORT TERM GOALS: Target date: 11/24/2023 Annel will be independent with her day 1 home exercise program Baseline: Started 10/27/2023 Goal status: On Going 11/05/2023  2.  Improve left knee active range of motion to 0 - 5 -110 degrees Baseline: 0 - 10 - 103 degrees Goal status: On Going 11/03/2023   LONG TERM GOALS: Target date: 12/22/2023  Improve patient specific functional score to 5.5 Baseline: 3.5 Goal status: INITIAL  2.  Akili will report left knee pain consistently 0-3/10 on the visual analog scale Baseline: 0-5/10 Goal status: INITIAL  3.  Improve left knee active range of motion to 0 - 3 - 115 degrees Baseline: 0 - 10 - 103 degrees Goal status: INITIAL  4.  Improve bilateral knee strength for quadriceps to at least 4/5 MMT Baseline: See objective Goal status: INITIAL  5.  Lorely will be independent and compliant with her long-term home maintenance exercise program at discharge Baseline: Started 10/27/2023 Goal status: INITIAL   PLAN:  PT FREQUENCY: 2-3 x a week  PT DURATION: 8 weeks  PLANNED INTERVENTIONS: 97750- Physical Performance Testing, 97110-Therapeutic  exercises, 97530- Therapeutic activity, 97112- Neuromuscular re-education,  02464- Self Care, [id number]- Manual therapy, [id number]- Gait training, [phone]- Electrical stimulation (unattended), [phone]- Vasopneumatic device, Patient/Family education, Balance training, Stair training, Joint mobilization, and Cryotherapy  PLAN FOR NEXT SESSION: Post TKA rehabilitation with heavy emphasis on extension active range of motion and quadriceps strength.  Progress gait and balance once active range of motion is normalized   Myer LELON Ivory, PT, MPT 11/05/2023, 3:31 PM

## 2023-11-08 ENCOUNTER — Ambulatory Visit (INDEPENDENT_AMBULATORY_CARE_PROVIDER_SITE_OTHER): Payer: Medicare HMO

## 2023-11-08 VITALS — Ht 63.0 in | Wt 188.0 lb

## 2023-11-08 DIAGNOSIS — E1169 Type 2 diabetes mellitus with other specified complication: Secondary | ICD-10-CM

## 2023-11-08 DIAGNOSIS — Z1211 Encounter for screening for malignant neoplasm of colon: Secondary | ICD-10-CM

## 2023-11-08 DIAGNOSIS — Z0000 Encounter for general adult medical examination without abnormal findings: Secondary | ICD-10-CM

## 2023-11-08 DIAGNOSIS — Z139 Encounter for screening, unspecified: Secondary | ICD-10-CM

## 2023-11-08 NOTE — Patient Instructions (Addendum)
 [name] , Thank you for taking time out of your busy schedule to complete your Annual Wellness Visit with me. I enjoyed our conversation and look forward to speaking with you again next year. I, as well as your care team,  appreciate your ongoing commitment to your health goals. Please review the following plan we discussed and let me know if I can assist you in the future. Your Game plan/ To Do List    Referrals: If you haven't heard from the office you've been referred to, please reach out to them at the phone provided.   Follow up Visits: Next Medicare AWV with our clinical staff: 11/13/24 Have you seen your provider in the last 6 months (3 months if uncontrolled diabetes)? Yes Next Office Visit with your provider: 01/03/24  Clinician Recommendations:  Each day, aim for 6 glasses of water, plenty of protein in your diet and try to get up and walk/ stretch every hour for 5-10 minutes at a time.        This is a list of the screening recommended for you and due dates:  Health Maintenance  Topic Date Due   Complete foot exam   Never done   Eye exam for diabetics  Never done   Yearly kidney health urinalysis for diabetes  Never done   Zoster (Shingles) Vaccine (1 of 2) Never done   Colon Cancer Screening  Never done   Screening for Lung Cancer  Never done   DEXA scan (bone density measurement)  Never done   DTaP/Tdap/Td vaccine (1 - Tdap) 10/10/2024*   Flu Shot  11/26/2023   Hemoglobin A1C  04/01/2024   Yearly kidney function blood test for diabetes  10/05/2024   Medicare Annual Wellness Visit  11/07/2024   Mammogram  08/03/2025   Pneumococcal Vaccine for age over 36  Completed   Hepatitis C Screening  Completed   Hepatitis B Vaccine  Aged Out   HPV Vaccine  Aged Out   Meningitis B Vaccine  Aged Out   COVID-19 Vaccine  Discontinued  *Topic was postponed. The date shown is not the original due date.    Advanced directives: (Declined) Advance directive discussed with you today.  Even though you declined this today, please call our office should you change your mind, and we can give you the proper paperwork for you to fill out. Advance Care Planning is important because it:  [x]  Makes sure you receive the medical care that is consistent with your values, goals, and preferences  [x]  It provides guidance to your family and loved ones and reduces their decisional burden about whether or not they are making the right decisions based on your wishes.  Follow the link provided in your after visit summary or read over the paperwork we have mailed to you to help you started getting your Advance Directives in place. If you need assistance in completing these, please reach out to us  so that we can help you!  See attachments for Preventive Care and Fall Prevention Tips.

## 2023-11-08 NOTE — Progress Notes (Addendum)
 Subjective:   [name] Good is a 73 y.o. who presents for a Medicare Wellness preventive visit.  As a reminder, Annual Wellness Visits don't include a physical exam, and some assessments may be limited, especially if this visit is performed virtually. We may recommend an in-person follow-up visit with your provider if needed.  Visit Complete: Virtual I connected with  [name] Good Learn on 11/08/23 by a audio enabled telemedicine application and verified that I am speaking with the correct person using two identifiers.  Patient Location: Home  Provider Location: Office/Clinic  I discussed the limitations of evaluation and management by telemedicine. The patient expressed understanding and agreed to proceed.  Vital Signs: Because this visit was a virtual/telehealth visit, some criteria may be missing or patient reported. Any vitals not documented were not able to be obtained and vitals that have been documented are patient reported.  VideoDeclined- This patient declined Librarian, academic. Therefore the visit was completed with audio only.  Persons Participating in Visit: Patient.  AWV Questionnaire: No: Patient Medicare AWV questionnaire was not completed prior to this visit.  Cardiac Risk Factors include: advanced age (>41men, >34 women);dyslipidemia;diabetes mellitus;obesity (BMI >30kg/m2)     Objective:    Today's Vitals   11/08/23 1529 11/08/23 1530  Weight: 188 lb (85.3 kg)   Height: 5' 3 (1.6 m)   PainSc: 7  7   PainLoc: Knee    Body mass index is 33.3 kg/m.     11/08/2023    3:39 PM 10/27/2023    5:55 PM 10/01/2023    5:23 PM 08/31/2023    4:33 PM 08/24/2023   11:27 AM 07/05/2023    7:23 PM 12/07/2022   12:41 AM  Advanced Directives  Does Patient Have a Medical Advance Directive? No No No No No No No  Would patient like information on creating a medical advance directive? No - Patient declined No - Patient declined No - Patient declined No -  Patient declined No - Patient declined No - Patient declined     Current Medications (verified) Outpatient Encounter Medications as of 11/08/2023  Medication Sig   apixaban  (ELIQUIS ) 2.5 MG TABS tablet Take 1 tablet (2.5 mg total) by mouth 2 (two) times daily.   atorvastatin  (LIPITOR ) 80 MG tablet Take 1 tablet (80 mg total) by mouth daily.   baclofen  (LIORESAL ) 10 MG tablet Take 1 tablet (10 mg total) by mouth 3 (three) times daily as needed for muscle spasms.   calcium  carbonate (TUMS EX) 750 MG chewable tablet Chew 1 tablet by mouth daily as needed for heartburn.   diphenhydrAMINE  (BENADRYL ) 25 mg capsule Take 25 mg by mouth at bedtime.   docusate sodium  (COLACE) 100 MG capsule Take 100 mg by mouth daily as needed for mild constipation or moderate constipation.   escitalopram  (LEXAPRO ) 20 MG tablet TAKE 1 TABLET BY MOUTH DAILY   ezetimibe  (ZETIA ) 10 MG tablet Take 1 tablet (10 mg total) by mouth daily.   famotidine  (PEPCID ) 20 MG tablet Take 20 mg by mouth daily as needed for heartburn or indigestion.   oxyCODONE -acetaminophen  (PERCOCET) 10-325 MG tablet Take 1 tablet by mouth 5 (five) times daily.   pantoprazole  (PROTONIX ) 40 MG tablet Take 1 tablet (40 mg total) by mouth 2 (two) times daily before a meal.   predniSONE  (DELTASONE ) 5 MG tablet TAKE 1 TABLET BY MOUTH DAILY   rOPINIRole  (REQUIP ) 3 MG tablet TAKE 1 TABLET BY MOUTH AT BEDTIME AS NEEDED   triamcinolone   ointment (KENALOG ) 0.5 % Apply 1 Application topically 2 (two) times daily.   Vitamin D , Ergocalciferol , (DRISDOL ) 1.25 MG (50000 UNIT) CAPS capsule Take 1 capsule (50,000 Units total) by mouth every 7 (seven) days.   oxyCODONE -acetaminophen  (PERCOCET/ROXICET) 5-325 MG tablet Take 1 tablet by mouth every 6 (six) hours as needed for severe pain (pain score 7-10). (Patient not taking: Reported on 11/08/2023)   phentermine  (ADIPEX-P ) 37.5 MG tablet TAKE 1 TABLET BY MOUTH EVERY MORNING (Patient not taking: Reported on 11/08/2023)    pregabalin  (LYRICA ) 25 MG capsule Take 1 capsule (25 mg total) by mouth 2 (two) times daily. (Patient not taking: Reported on 11/08/2023)   [DISCONTINUED] clopidogrel  (PLAVIX ) 75 MG tablet Take 1 tablet (75 mg total) by mouth daily.   No facility-administered encounter medications on file as of 11/08/2023.    Allergies (verified) Amoxicillin   History: Past Medical History:  Diagnosis Date   Anxiety    Arthritis    Depression    GERD (gastroesophageal reflux disease)    Myocardial infarction (HCC) 89   no visits to cardiac dr(thomas kelly) since 89   Stroke (HCC)    Wound dehiscence    lumbar   Past Surgical History:  Procedure Laterality Date   APPENDECTOMY     17 yrs   BACK SURGERY     BREAST SURGERY Left    cyst   BUBBLE STUDY  03/12/2020   Procedure: BUBBLE STUDY;  Surgeon: Claudene Pacific, MD;  Location: [facility];  Service: Cardiovascular;;   CARDIAC CATHETERIZATION  61   CHOLECYSTECTOMY     73 yrs old   ESOPHAGOGASTRODUODENOSCOPY N/A 10/05/2023   Procedure: EGD (ESOPHAGOGASTRODUODENOSCOPY);  Surgeon: San Sandor GAILS, DO;  Location: [facility];  Service: Gastroenterology;  Laterality: N/A;   HOT HEMOSTASIS N/A 10/05/2023   Procedure: EGD, WITH ARGON PLASMA COAGULATION;  Surgeon: [place] Sandor GAILS, DO;  Location: [facility];  Service: Gastroenterology;  Laterality: N/A;   LUMBAR LAMINECTOMY/DECOMPRESSION MICRODISCECTOMY N/A 06/14/2015   Procedure: Lumbar Three-Four,Lumbar Four-Five, Lumbar Five-Sacral One Laminectomy;  Surgeon: Alm GORMAN Molt, MD;  Location: [facility];  Service: Neurosurgery;  Laterality: N/A;   LUMBAR WOUND DEBRIDEMENT N/A 07/24/2015   Procedure: lumbar wound revision;  Surgeon: Alm GORMAN Molt, MD;  Location: [facility];  Service: Neurosurgery;  Laterality: N/A;   TEE WITHOUT CARDIOVERSION N/A 03/12/2020   Procedure: TRANSESOPHAGEAL ECHOCARDIOGRAM (TEE);  Surgeon: Claudene Pacific, MD;  Location: [facility] ENDOSCOPY;  Service: Cardiovascular;  Laterality:  N/A;   TOTAL HIP ARTHROPLASTY Right 10/25/2015   Procedure: RIGHT TOTAL HIP ARTHROPLASTY ANTERIOR APPROACH;  Surgeon: Lonni CINDERELLA Poli, MD;  Location: [facility];  Service: Orthopedics;  Laterality: Right;   TOTAL KNEE ARTHROPLASTY Left 08/31/2023   Procedure: ARTHROPLASTY, KNEE, TOTAL;  Surgeon: Poli Lonni CINDERELLA, MD;  Location: [facility] OR;  Service: Orthopedics;  Laterality: Left;   TUBAL LIGATION     Family History  Problem Relation Age of Onset   Stroke Mother    Clotting disorder Neg Hx    Social History   Socioeconomic History   Marital status: Married    Spouse name: Sreeja Spies   Number of children: 4   Years of education: GED   Highest education level: GED or equivalent  Occupational History   Occupation: retired   Tobacco Use   Smoking status: Former    Current packs/day: 0.00    Average packs/day: 2.0 packs/day for 40.0 years (80.0 ttl pk-yrs)    Types: Cigarettes    Start date: 04/10/1973  Quit date: 04/10/2013    Years since quitting: 10.5   Smokeless tobacco: Never  Vaping Use   Vaping status: Every Day   Start date: 04/11/2013   Substances: Nicotine, Nicotine-salt  Substance and Sexual Activity   Alcohol use: Not Currently   Drug use: No   Sexual activity: Not Currently  Other Topics Concern   Not on file  Social History Narrative   Lives with husband   Right Handed   Drinks1-2 cups caffeine daily   Social Drivers of Health   Financial Resource Strain: Low Risk  (11/08/2023)   Overall Financial Resource Strain (CARDIA)    Difficulty of Paying Living Expenses: Not hard at all  Food Insecurity: No Food Insecurity (11/08/2023)   Hunger Vital Sign    Worried About Running Out of Food in the Last Year: Never true    Ran Out of Food in the Last Year: Never true  Transportation Needs: No Transportation Needs (11/08/2023)   PRAPARE - Administrator, Civil Service (Medical): No    Lack of Transportation (Non-Medical): No  Physical Activity:  Inactive (11/08/2023)   Exercise Vital Sign    Days of Exercise per Week: 0 days    Minutes of Exercise per Session: 0 min  Stress: No Stress Concern Present (11/08/2023)   [employer] of Occupational Health - Occupational Stress Questionnaire    Feeling of Stress: Only a little  Social Connections: Moderately Isolated (11/08/2023)   Social Connection and Isolation Panel    Frequency of Communication with Friends and Family: More than three times a week    Frequency of Social Gatherings with Friends and Family: Twice a week    Attends Religious Services: Never    Database administrator or Organizations: No    Attends Engineer, structural: Never    Marital Status: Married    Tobacco Counseling Counseling given: Not Answered    Clinical Intake:  Pre-visit preparation completed: Yes  Pain : 0-10 Pain Score: 7  Pain Type: Chronic pain Pain Location: Knee Pain Orientation: Right Pain Descriptors / Indicators: Aching Pain Onset: More than a month ago     BMI - recorded: 33.3 Nutritional Status: BMI > 30  Obese Nutritional Risks: None Diabetes: Yes CBG done?: No Did pt. bring in CBG monitor from home?: No  Lab Results  Component Value Date   HGBA1C 4.6 10/01/2023   HGBA1C 5.6 05/20/2023   HGBA1C 5.4 02/16/2023     How often do you need to have someone help you when you read instructions, pamphlets, or other written materials from your doctor or pharmacy?: 1 - Never  Interpreter Needed?: No  Information entered by :: [name] Haws, LPN   Activities of Daily Living     11/08/2023    3:31 PM 10/01/2023    8:00 PM  In your present state of health, do you have any difficulty performing the following activities:  Hearing? 0 0  Vision? 0 0  Difficulty concentrating or making decisions? 0 0  Walking or climbing stairs? 1   Comment right knee   Dressing or bathing? 0   Doing errands, shopping? 0 1  Preparing Food and eating ? Y   Comment difficulty  with knee daughter and husband prepares meals   Using the Toilet? N   In the past six months, have you accidently leaked urine? N   Do you have problems with loss of bowel control? N   Managing your Medications? N  Managing your Finances? N   Housekeeping or managing your Housekeeping? Y   Comment husband and daughter assist     Patient Care Team: Kennyth Worth HERO, MD as PCP - General (Family Medicine) Dann Candyce RAMAN, MD as PCP - Cardiology (Cardiology)  I have updated your Care Teams any recent Medical Services you may have received from other providers in the past year.     Assessment:   This is a routine wellness examination for [name] Good.  Hearing/Vision screen Hearing Screening - Comments:: Pt denies any hearing issues  Vision Screening - Comments:: Wears rx glasses - up to date with routine eye exams with [facility]    Goals Addressed               This Visit's Progress     Patient Stated (pt-stated)        Get more mobile        Depression Screen     11/08/2023    3:33 PM 10/11/2023   10:17 AM 10/07/2023   10:54 AM 10/07/2023   10:40 AM 08/05/2023    9:54 AM 01/01/2023    2:36 PM 11/10/2022    2:25 PM  PHQ 2/9 Scores  PHQ - 2 Score 1 0 0 0 3 1 0  PHQ- 9 Score     6 2 0    Fall Risk     11/08/2023    3:39 PM 10/11/2023   10:17 AM 10/07/2023   10:53 AM 08/05/2023    9:17 AM 01/01/2023    2:36 PM  Fall Risk   Falls in the past year? 0 0 0 0 1  Number falls in past yr: 0 0  0 0  Injury with Fall? 0 0  0 0  Risk for fall due to : Impaired balance/gait;Impaired mobility No Fall Risks  No Fall Risks Impaired balance/gait  Follow up Falls prevention discussed  Falls evaluation completed;Falls prevention discussed      MEDICARE RISK AT HOME:  Medicare Risk at Home Any stairs in or around the home?: Yes If so, are there any without handrails?: No Home free of loose throw rugs in walkways, pet beds, electrical cords, etc?: Yes Adequate lighting in  your home to reduce risk of falls?: Yes Life alert?: No Use of a cane, walker or w/c?: Yes Grab bars in the bathroom?: Yes Shower chair or bench in shower?: Yes Elevated toilet seat or a handicapped toilet?: Yes  TIMED UP AND GO:  Was the test performed?  No  Cognitive Function: 6CIT completed        11/08/2023    3:40 PM 11/05/2022    4:36 PM 10/23/2021   11:05 AM  6CIT Screen  What Year? 0 points 0 points 0 points  What month? 0 points 0 points 0 points  What time? 0 points 0 points 0 points  Count back from 20 0 points 0 points 0 points  Months in reverse 0 points 0 points 0 points  Repeat phrase 0 points 0 points 0 points  Total Score 0 points 0 points 0 points    Immunizations Immunization History  Administered Date(s) Administered   PFIZER(Purple Top)SARS-COV-2 Vaccination 07/19/2019, 08/09/2019   PNEUMOCOCCAL CONJUGATE-20 07/15/2021   Pneumococcal-Unspecified 06/07/2013    Screening Tests Health Maintenance  Topic Date Due   FOOT EXAM  Never done   OPHTHALMOLOGY EXAM  Never done   Diabetic kidney evaluation - Urine ACR  Never done   Zoster Vaccines- Shingrix (  1 of 2) Never done   Colonoscopy  Never done   Lung Cancer Screening  Never done   DEXA SCAN  Never done   DTaP/Tdap/Td (1 - Tdap) 10/10/2024 (Originally 06/08/1969)   INFLUENZA VACCINE  11/26/2023   HEMOGLOBIN A1C  04/01/2024   Diabetic kidney evaluation - eGFR measurement  10/05/2024   Medicare Annual Wellness (AWV)  11/07/2024   MAMMOGRAM  08/03/2025   Pneumococcal Vaccine: 50+ Years  Completed   Hepatitis C Screening  Completed   Hepatitis B Vaccines  Aged Out   HPV VACCINES  Aged Out   Meningococcal B Vaccine  Aged Out   COVID-19 Vaccine  Discontinued    Health Maintenance  Health Maintenance Due  Topic Date Due   FOOT EXAM  Never done   OPHTHALMOLOGY EXAM  Never done   Diabetic kidney evaluation - Urine ACR  Never done   Zoster Vaccines- Shingrix (1 of 2) Never done   Colonoscopy   Never done   Lung Cancer Screening  Never done   DEXA SCAN  Never done   Health Maintenance Items Addressed: See Nurse Notes at the end of this note  Additional Screening:  Vision Screening: Recommended annual ophthalmology exams for early detection of glaucoma and other disorders of the eye. Would you like a referral to an eye doctor? No    Dental Screening: Recommended annual dental exams for proper oral hygiene  Community Resource Referral / Chronic Care Management: CRR required this visit?  No   CCM required this visit?  No   Plan:    I have personally reviewed and noted the following in the patient's chart:   Medical and social history Use of alcohol, tobacco or illicit drugs  Current medications and supplements including opioid prescriptions. Patient is currently taking opioid prescriptions. Information provided to patient regarding non-opioid alternatives. Patient advised to discuss non-opioid treatment plan with their provider. Functional ability and status Nutritional status Physical activity Advanced directives List of other physicians Hospitalizations, surgeries, and ER visits in previous 12 months Vitals Screenings to include cognitive, depression, and falls Referrals and appointments  In addition, I have reviewed and discussed with patient certain preventive protocols, quality metrics, and best practice recommendations. A written personalized care plan for preventive services as well as general preventive health recommendations were provided to patient.   [name] VEAR Haws, LPN   [id number]   After Visit Summary: (MyChart) Due to this being a telephonic visit, the after visit summary with patients personalized plan was offered to patient via MyChart   Notes: PCP Follow Up Recommendations: foot exam due next appt , pt stated dexa scan was complete at [facility] hospital. Opthalmology appt is scheduled for 03/2024

## 2023-11-09 ENCOUNTER — Encounter: Payer: Self-pay | Admitting: Physical Therapy

## 2023-11-09 ENCOUNTER — Ambulatory Visit: Admitting: Physical Therapy

## 2023-11-09 DIAGNOSIS — M6281 Muscle weakness (generalized): Secondary | ICD-10-CM

## 2023-11-09 DIAGNOSIS — R600 Localized edema: Secondary | ICD-10-CM | POA: Diagnosis not present

## 2023-11-09 DIAGNOSIS — M25562 Pain in left knee: Secondary | ICD-10-CM | POA: Diagnosis not present

## 2023-11-09 DIAGNOSIS — M25662 Stiffness of left knee, not elsewhere classified: Secondary | ICD-10-CM

## 2023-11-09 DIAGNOSIS — R262 Difficulty in walking, not elsewhere classified: Secondary | ICD-10-CM

## 2023-11-09 NOTE — Therapy (Signed)
 OUTPATIENT PHYSICAL THERAPY LOWER EXTREMITY TREATMENT  Date of referral: 10/13/2023 Referring provider: Lonni Good Poli, MD Referring diagnosis?  Diagnosis  Z96.652 (ICD-10-CM) - Status post total left knee replacement   Treatment diagnosis? (if different than referring diagnosis) R26.2   R60.0   M62.81   M25.662   M25.562  What was this (referring dx) caused by? Surgery (Type: TKA) and Arthritis  Nature of Condition: Initial Onset (within last 3 months)   Laterality: Lt  Current Functional Measure Score: Patient Specific Functional Scale 3.5  Objective measurements identify impairments when they are compared to normal values, the uninvolved extremity, and prior level of function.  [x]  Yes  []  No  Objective assessment of functional ability: Severe functional limitations   Briefly describe symptoms: Walking with a cane, poor sleep, poor function post total knee replacement  How did symptoms start: Osteoarthritis  Average pain intensity:  Last 24 hours: 0-5/10  Past week: 0-5/10  How often does the pt experience symptoms? Constantly  How much have the symptoms interfered with usual daily activities? Quite a bit  How has condition changed since care began at this facility? NA - initial visit  In general, how is the patients overall health? Good   BACK PAIN (STarT Back Screening Tool) No   Patient Name: [name] Good MRN: [id number] DOB:26-Jul-1950, 73 y.o., female Today's Date: 11/09/2023  END OF SESSION:  PT End of Session - 11/09/23 1348     Visit Number 4    Number of Visits 24    Date for PT Re-Evaluation 12/22/23    Authorization Type UHC Medicare    Authorization Time Period [facility] MEDICARE $15 COPAY 16 VISITS APPROVED 7/2-8/27/25    Authorization - Visit Number 3    Authorization - Number of Visits 12    Progress Note Due on Visit 10    PT Start Time 1345    PT Stop Time 1436    PT Time Calculation (min) 51 min    Equipment Utilized During  Treatment Gait belt    Activity Tolerance Patient tolerated treatment well;No increased pain;Patient limited by pain    Behavior During Therapy [facility] for tasks assessed/performed             Past Medical History:  Diagnosis Date   Anxiety    Arthritis    Depression    GERD (gastroesophageal reflux disease)    Myocardial infarction (HCC) 89   no visits to cardiac dr(thomas Good) since 89   Stroke (HCC)    Wound dehiscence    lumbar   Past Surgical History:  Procedure Laterality Date   APPENDECTOMY     17 yrs   BACK SURGERY     BREAST SURGERY Left    cyst   BUBBLE STUDY  03/12/2020   Procedure: BUBBLE STUDY;  Surgeon: [name] Pacific, MD;  Location: [facility] ENDOSCOPY;  Service: Cardiovascular;;   CARDIAC CATHETERIZATION  64   CHOLECYSTECTOMY     73 yrs old   ESOPHAGOGASTRODUODENOSCOPY N/A 10/05/2023   Procedure: EGD (ESOPHAGOGASTRODUODENOSCOPY);  Surgeon: [name] Sandor GAILS, DO;  Location: [facility] ENDOSCOPY;  Service: Gastroenterology;  Laterality: N/A;   HOT HEMOSTASIS N/A 10/05/2023   Procedure: EGD, WITH ARGON PLASMA COAGULATION;  Surgeon: [name] Sandor GAILS, DO;  Location: [facility] ENDOSCOPY;  Service: Gastroenterology;  Laterality: N/A;   LUMBAR LAMINECTOMY/DECOMPRESSION MICRODISCECTOMY N/A 06/14/2015   Procedure: Lumbar Three-Four,Lumbar Four-Five, Lumbar Five-Sacral One Laminectomy;  Surgeon: [name] GORMAN Molt, MD;  Location: [facility] NEURO ORS;  Service: Neurosurgery;  Laterality: N/A;  LUMBAR WOUND DEBRIDEMENT N/A 07/24/2015   Procedure: lumbar wound revision;  Surgeon: [name] GORMAN Molt, MD;  Location: [facility] NEURO ORS;  Service: Neurosurgery;  Laterality: N/A;   TEE WITHOUT CARDIOVERSION N/A 03/12/2020   Procedure: TRANSESOPHAGEAL ECHOCARDIOGRAM (TEE);  Surgeon: [name] Pacific, MD;  Location: [facility] ENDOSCOPY;  Service: Cardiovascular;  Laterality: N/A;   TOTAL HIP ARTHROPLASTY Right 10/25/2015   Procedure: RIGHT TOTAL HIP ARTHROPLASTY ANTERIOR APPROACH;  Surgeon: [name] Good Poli, MD;  Location: [facility]  ORS;  Service: Orthopedics;  Laterality: Right;   TOTAL KNEE ARTHROPLASTY Left 08/31/2023   Procedure: ARTHROPLASTY, KNEE, TOTAL;  Surgeon: Good [name] CINDERELLA, MD;  Location: [facility] OR;  Service: Orthopedics;  Laterality: Left;   TUBAL LIGATION     Patient Active Problem List   Diagnosis Date Noted   Iron deficiency anemia 10/04/2023   Dermatitis 10/01/2023   GI bleed 10/01/2023   OA (osteoarthritis) of knee 08/31/2023   Status post total left knee replacement 08/31/2023   Senile purpura (HCC) 08/05/2023   Supratherapeutic INR 07/05/2023   Unilateral primary osteoarthritis, right knee 06/09/2023   Vitamin D  deficiency 02/16/2023   Dyslipidemia associated with type 2 diabetes mellitus (HCC) 01/01/2023   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   History of CVA (cerebrovascular accident) 12/07/2022   T2DM (type 2 diabetes mellitus) (HCC) 08/11/2022   Chronic pain syndrome 10/30/2021   Morbid obesity (HCC) 09/02/2021   Depression, major, single episode, moderate (HCC) 07/15/2021   Vertebral artery stenosis 07/15/2021   Cerebrovascular disease 07/15/2021   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   RLS (restless legs syndrome) 10/02/2019   Arthritis 10/18/2017   Former smoker 11/13/2016   Autoimmune disease (HCC) 08/12/2016   Obesity (BMI 35.0-39.9 without comorbidity) 08/12/2016   Status post total replacement of right hip 10/25/2015   S/P lumbar laminectomy 06/14/2015    PCP: [name] CHRISTELLA Kitty, MD  REFERRING PROVIDER: Lonni Good Poli, MD  REFERRING DIAG:  Diagnosis  [phone] (ICD-10-CM) - Status post total left knee replacement    THERAPY DIAG:  Difficulty in walking, not elsewhere classified  Muscle weakness (generalized)  Localized edema  Stiffness of left knee, not elsewhere classified  Acute pain of left knee  Rationale for Evaluation and Treatment: Rehabilitation  ONSET DATE: Aug 31, 2023  SUBJECTIVE:   SUBJECTIVE STATEMENT: [name] Good reports her  exercises are going well.  She is sleeping 3-4 hours at most.  She is sleeping on her back and normally side sleeper.    PERTINENT HISTORY: MI, stroke, back surgery (lumbar laminectomy with decompression and microdiscectomy), right THA, left TKA, former smoker and current vapor  PAIN:  Are you having pain? Yes: NPRS scale: at rest 3-4/10,  standing / walking 5/10 and when exercising 7-8/10 this week Pain location: Left knee Pain description: Achy, stiff, sore, sharp Aggravating factors: Sleeping, too much WB, prolonged postures Relieving factors: Ice, oxycodone  2 x a day  PRECAUTIONS: Back  RED FLAGS: None   WEIGHT BEARING RESTRICTIONS: No  FALLS:  Has patient fallen in last 6 months? No  LIVING ENVIRONMENT: Lives with: lives with their family and lives with their spouse Lives in: Mobile home Stairs: Needs handrail Has following equipment at home: Single point cane, Environmental consultant - 2 wheeled, and Tour manager  OCCUPATION: Retired  PLOF: Independent with basic ADLs  PATIENT GOALS: Return to painting birdhouses  NEXT MD VISIT: July 16 at 1:30  OBJECTIVE:  Note: Objective measures were completed at Evaluation unless otherwise noted.  DIAGNOSTIC FINDINGS: Interval total left knee arthroplasty without evidence  of hardware failure.  PATIENT SURVEYS:  Patient specific functional scale (0 unable to 10 no difficulty) 1) Sit to standing transfers   3/10 2) Walking independently  4/10   COGNITION: Overall cognitive status: Within functional limits for tasks assessed     SENSATION: Occasionally, not now.  Enza does note dizziness though.  EDEMA:  Noted and not objectively assessed   LOWER EXTREMITY ROM:  Active ROM Left/Right 10/27/2023 Left 11/03/2023  Hip flexion    Hip extension    Hip abduction    Hip adduction    Hip internal rotation    Hip external rotation    Knee flexion 103/126 105  Knee extension -10/-5 -7  Ankle dorsiflexion    Ankle plantarflexion     Ankle inversion    Ankle eversion     (Blank rows = not tested)  LOWER EXTREMITY MMT:  MMT Left/Right 10/27/2023   Hip flexion    Hip extension    Hip abduction    Hip adduction    Hip internal rotation    Hip external rotation    Knee flexion    Knee extension 2+/4-   Ankle dorsiflexion    Ankle plantarflexion    Ankle inversion    Ankle eversion     (Blank rows = not tested)  GAIT: Distance walked: 200 feet Assistive device utilized: Environmental consultant - 2 wheeled Level of assistance: Complete Independence Comments: Jarica notes she has been using an assistive device for years due to bilateral knee pain                                                                                                                              TREATMENT DATE:  11/09/2023    Therapeutic Exercises: Recumbent bike Seat 6 for 6 minutes  Seated heel slides with foot on pillow case: ext/leg press 5 sec and active knee flexion with RLE assist end range 5 sec hold 10 reps.  Seated LAQ 2# LLE and active knee flexion with contralateral LE opposing motion 10 reps 2 sets.   Bridge 10 reps 2 sets  Functional Activities: Double Leg Press 87# full extension and slow eccentric into flexion 10 x 2 sets LLE Single Leg Press 37# full extension and slow eccentric into flexion 10 x 2 sets PT demo & verbal cues on weight shift to LLE upon arising.  Pt return demo and reports less knee pain with initiating gait.    Neuromuscular re-education: Tandem balance 20 seconds LLE in front & in back;  3/4 step (heel to opposite toe) floor eyes closed 15 sec with minA Single leg stance modified activity:  placing forefoot on lower shelf of cabinet & tapping 5 times with ea LE 2 reps.  Pt requires HHA with LUE (more assist with RLE stance than LLE)  Self-care: PT demo & verbal cues on positioning in bed including tenting sheets off feet and pillow bw LEs for sidelying.  PT reviewed need  for food when taking her pain meds.  Pt  verbalized understanding.    Vaso left knee elevation medium compression 34* 10 min.     11/05/2023 Recumbent bike Seat 5 and 6 3 minutes each Quad sets with left heel prop 2 sets of 10 x 5 seconds Seated knee AAROM (right pushes left into flexion) 10 x 10 seconds Seated low load, long duration knee extension stretch with 2# hanging from just above the kneecap 4 minutes  Functional Activities: Double Leg Press 87# full extension and slow eccentric into flexion 15 x Single Leg Press 31# full extension and slow eccentric into flexion 12 x  Neuromuscular re-education: Tandem balance 8 x 20 seconds Single leg stance/Dynamic marching 2 laps (tough time with single leg stance on the right-hip abductors weakness)   11/03/2023 Recumbent bike Seat 5 and 4 2 minutes each Quad sets with left heel prop 10 x 5 seconds Seated knee AAROM (right pushes left into flexion) 5 x 10 seconds  Functional Activities: Double Leg Press 75# full extension and slow eccentric into flexion 12 x Single Leg Press 25# full extension and slow eccentric into flexion 10 x  Neuromuscular re-education: Tandem balance 10 x 20 seconds Single leg stance/Dynamic marching 2 laps   PATIENT EDUCATION:  Education details: See above Person educated: Patient and Child(ren) Education method: Explanation, Demonstration, Tactile cues, Verbal cues, and Handouts Education comprehension: verbalized understanding, returned demonstration, verbal cues required, tactile cues required, and needs further education  HOME EXERCISE PROGRAM: Access Code: [email or web address] URL: [email or web address] Date: 10/27/2023 Prepared by: Lamar Ivory  Exercises - Supine Quadricep Sets  - 5 x daily - 7 x weekly - 2 sets - 10 reps - 5 second hold - Seated Knee Flexion AAROM  - 3 x daily - 7 x weekly - 1 sets - 10 reps - 10 seconds hold - Tandem Stance  - 2 x daily - 7 x weekly - 1 sets - 5 reps - 20 second hold  ASSESSMENT:  CLINICAL  IMPRESSION: Patient appears to understand PT recommendations for positioning in bed to improve sleep.  She improved initiation of gait with weight shift prior.  She is limited by pain for range & strength but works thru it.  She reports that her right leg hurts more than left now.   OBJECTIVE IMPAIRMENTS: Abnormal gait, cardiopulmonary status limiting activity, decreased activity tolerance, decreased balance, decreased endurance, decreased knowledge of condition, difficulty walking, decreased ROM, decreased strength, increased edema, impaired perceived functional ability, obesity, and pain.   ACTIVITY LIMITATIONS: carrying, lifting, bending, standing, squatting, sleeping, stairs, transfers, bed mobility, and locomotion level  PARTICIPATION LIMITATIONS: meal prep, cleaning, shopping, and community activity  PERSONAL FACTORS: MI, stroke, back surgery (lumbar laminectomy with decompression and microdiscectomy), right THA, left TKA, former smoker and current vapor are also affecting patient's functional outcome.   REHAB POTENTIAL: Good  CLINICAL DECISION MAKING: Stable/uncomplicated  EVALUATION COMPLEXITY: Low   GOALS: Goals reviewed with patient? Yes  SHORT TERM GOALS: Target date: 11/24/2023 Retaj will be independent with her day 1 home exercise program Baseline: Started 10/27/2023 Goal status:   Ongoing  11/09/2023  2.  Improve left knee active range of motion to 0 - 5 -110 degrees Baseline: 0 - 10 - 103 degrees Goal status: Ongoing  11/09/2023   LONG TERM GOALS: Target date: 12/22/2023  Improve patient specific functional score to 5.5 Baseline: 3.5 Goal status: Ongoing  11/09/2023  2.  Saanvi will report left knee pain consistently 0-3/10 on the  visual analog scale Baseline: 0-5/10 Goal status: Ongoing  11/09/2023  3.  Improve left knee active range of motion to 0 - 3 - 115 degrees Baseline: 0 - 10 - 103 degrees Goal status: Ongoing  11/09/2023  4.  Improve bilateral knee  strength for quadriceps to at least 4/5 MMT Baseline: See objective Goal status: Ongoing  11/09/2023  5.  Aveleen will be independent and compliant with her long-term home maintenance exercise program at discharge Baseline: Started 10/27/2023 Goal status: Ongoing  11/09/2023   PLAN:  PT FREQUENCY: 2-3 x a week  PT DURATION: 8 weeks  PLANNED INTERVENTIONS: 97750- Physical Performance Testing, 97110-Therapeutic exercises, 97530- Therapeutic activity, 97112- Neuromuscular re-education, 97535- Self Care, [id number]- Manual therapy, [phone]- Gait training, [phone]- Electrical stimulation (unattended), [id number]- Vasopneumatic device, Patient/Family education, Balance training, Stair training, Joint mobilization, and Cryotherapy  PLAN FOR NEXT SESSION:  check range at next visit.  Continue to progress functional activities.   Post TKA rehabilitation with heavy emphasis on extension active range of motion and quadriceps strength.  Progress gait and balance once active range of motion is normalized   Grayce Spatz, PT, DPT 11/09/2023, 2:32 PM

## 2023-11-10 ENCOUNTER — Ambulatory Visit (INDEPENDENT_AMBULATORY_CARE_PROVIDER_SITE_OTHER): Admitting: Orthopaedic Surgery

## 2023-11-10 ENCOUNTER — Encounter: Payer: Self-pay | Admitting: Orthopaedic Surgery

## 2023-11-10 DIAGNOSIS — Z96652 Presence of left artificial knee joint: Secondary | ICD-10-CM

## 2023-11-10 NOTE — Progress Notes (Signed)
 The patient continues to make good progress status post a left total knee arthroplasty that was performed about 9 to 10 weeks ago.  She is ambulate with a walker.  She 73 years old.  We have replaced the hip on her before.  She reports improving range of motion and strength.  I did read the notes from Robin from physical therapy and the patient is improving with her mobility and motion.  On my exam today her extension is almost full and I can flex her to around 100 degrees but does seem to be improving I could barely get her beyond that but I think is going to continue to improve.  The knee feels stable otherwise.  She will continue therapy.  From my standpoint the next time I need to see her is not for 3 months unless there are issues.  Will have a standing AP and lateral of her  left knee at that visit.

## 2023-11-12 ENCOUNTER — Ambulatory Visit: Admitting: Physical Therapy

## 2023-11-12 ENCOUNTER — Encounter: Payer: Self-pay | Admitting: Physical Therapy

## 2023-11-12 DIAGNOSIS — R600 Localized edema: Secondary | ICD-10-CM | POA: Diagnosis not present

## 2023-11-12 DIAGNOSIS — M25662 Stiffness of left knee, not elsewhere classified: Secondary | ICD-10-CM | POA: Diagnosis not present

## 2023-11-12 DIAGNOSIS — R262 Difficulty in walking, not elsewhere classified: Secondary | ICD-10-CM | POA: Diagnosis not present

## 2023-11-12 DIAGNOSIS — M6281 Muscle weakness (generalized): Secondary | ICD-10-CM

## 2023-11-12 DIAGNOSIS — M25562 Pain in left knee: Secondary | ICD-10-CM

## 2023-11-12 NOTE — Therapy (Signed)
 OUTPATIENT PHYSICAL THERAPY LOWER EXTREMITY TREATMENT  Date of referral: 10/13/2023 Referring provider: Lonni CINDERELLA Poli, MD Referring diagnosis?  Diagnosis  Z96.652 (ICD-10-CM) - Status post total left knee replacement   Treatment diagnosis? (if different than referring diagnosis) R26.2   R60.0   M62.81   M25.662   M25.562  What was this (referring dx) caused by? Surgery (Type: TKA) and Arthritis  Nature of Condition: Initial Onset (within last 3 months)   Laterality: Lt  Current Functional Measure Score: Patient Specific Functional Scale 3.5  Objective measurements identify impairments when they are compared to normal values, the uninvolved extremity, and prior level of function.  [x]  Yes  []  No  Objective assessment of functional ability: Severe functional limitations   Briefly describe symptoms: Walking with a cane, poor sleep, poor function post total knee replacement  How did symptoms start: Osteoarthritis  Average pain intensity:  Last 24 hours: 0-5/10  Past week: 0-5/10  How often does the pt experience symptoms? Constantly  How much have the symptoms interfered with usual daily activities? Quite a bit  How has condition changed since care began at this facility? NA - initial visit  In general, how is the patients overall health? Good   BACK PAIN (STarT Back Screening Tool) No   Patient Name: [name] Good MRN: [id number] DOB:01/22/1951, 73 y.o., female Today's Date: 11/12/2023  END OF SESSION:  PT End of Session - 11/12/23 1403     Visit Number 5    Number of Visits 24    Date for PT Re-Evaluation 12/22/23    Authorization Type UHC Medicare    Authorization Time Period [facility] MEDICARE $15 COPAY 16 VISITS APPROVED 7/2-8/27/25    Progress Note Due on Visit 10    PT Start Time 1355   pt late   PT Stop Time 1426    PT Time Calculation (min) 31 min    Activity Tolerance Patient tolerated treatment well;No increased pain;Patient limited by pain     Behavior During Therapy [facility] for tasks assessed/performed              Past Medical History:  Diagnosis Date   Anxiety    Arthritis    Depression    GERD (gastroesophageal reflux disease)    Myocardial infarction (HCC) 89   no visits to cardiac dr(thomas kelly) since 92   Stroke (HCC)    Wound dehiscence    lumbar   Past Surgical History:  Procedure Laterality Date   APPENDECTOMY     17 yrs   BACK SURGERY     BREAST SURGERY Left    cyst   BUBBLE STUDY  03/12/2020   Procedure: BUBBLE STUDY;  Surgeon: Claudene Pacific, MD;  [facility];  Service: Cardiovascular;;   CARDIAC CATHETERIZATION  86   CHOLECYSTECTOMY     73 yrs old   ESOPHAGOGASTRODUODENOSCOPY N/A 10/05/2023   Procedure: EGD (ESOPHAGOGASTRODUODENOSCOPY);  Surgeon: San Sandor GAILS, DO;  Location: [facility];  Service: Gastroenterology;  Laterality: N/A;   HOT HEMOSTASIS N/A 10/05/2023   Procedure: EGD, WITH ARGON PLASMA COAGULATION;  Surgeon: San Sandor GAILS, DO;  Location: [facility];  Service: Gastroenterology;  Laterality: N/A;   LUMBAR LAMINECTOMY/DECOMPRESSION MICRODISCECTOMY N/A 06/14/2015   Procedure: Lumbar Three-Four,Lumbar Four-Five, Lumbar Five-Sacral One Laminectomy;  Surgeon: Alm GORMAN Molt, MD;  [facility]: [facility];  Service: Neurosurgery;  Laterality: N/A;   LUMBAR WOUND DEBRIDEMENT N/A 07/24/2015   Procedure: lumbar wound revision;  Surgeon: Alm GORMAN Molt, MD;  Location: [facility]  NEURO ORS;  Service: Neurosurgery;  Laterality: N/A;   TEE WITHOUT CARDIOVERSION N/A 03/12/2020   Procedure: TRANSESOPHAGEAL ECHOCARDIOGRAM (TEE);  Surgeon: Claudene Pacific, MD;  Location: [facility] ENDOSCOPY;  Service: Cardiovascular;  Laterality: N/A;   TOTAL HIP ARTHROPLASTY Right 10/25/2015   Procedure: RIGHT TOTAL HIP ARTHROPLASTY ANTERIOR APPROACH;  Surgeon: Lonni CINDERELLA Poli, MD;  Location: [facility];  Service: Orthopedics;  Laterality: Right;   TOTAL KNEE ARTHROPLASTY Left 08/31/2023   Procedure: ARTHROPLASTY,  KNEE, TOTAL;  Surgeon: Poli Lonni CINDERELLA, MD;  Location: [facility] OR;  Service: Orthopedics;  Laterality: Left;   TUBAL LIGATION     Patient Active Problem List   Diagnosis Date Noted   Iron deficiency anemia 10/04/2023   Dermatitis 10/01/2023   GI bleed 10/01/2023   OA (osteoarthritis) of knee 08/31/2023   Status post total left knee replacement 08/31/2023   Senile purpura (HCC) 08/05/2023   Supratherapeutic INR 07/05/2023   Unilateral primary osteoarthritis, right knee 06/09/2023   Vitamin D  deficiency 02/16/2023   Dyslipidemia associated with type 2 diabetes mellitus (HCC) 01/01/2023   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   History of CVA (cerebrovascular accident) 12/07/2022   T2DM (type 2 diabetes mellitus) (HCC) 08/11/2022   Chronic pain syndrome 10/30/2021   Morbid obesity (HCC) 09/02/2021   Depression, major, single episode, moderate (HCC) 07/15/2021   Vertebral artery stenosis 07/15/2021   Cerebrovascular disease 07/15/2021   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   RLS (restless legs syndrome) 10/02/2019   Arthritis 10/18/2017   Former smoker 11/13/2016   Autoimmune disease (HCC) 08/12/2016   Obesity (BMI 35.0-39.9 without comorbidity) 08/12/2016   Status post total replacement of right hip 10/25/2015   S/P lumbar laminectomy 06/14/2015    PCP: Worth CHRISTELLA Kitty, MD  REFERRING PROVIDER: Lonni CINDERELLA Poli, MD  REFERRING DIAG:  Diagnosis  [phone] (ICD-10-CM) - Status post total left knee replacement    THERAPY DIAG:  Difficulty in walking, not elsewhere classified  Muscle weakness (generalized)  Localized edema  Stiffness of left knee, not elsewhere classified  Acute pain of left knee  Rationale for Evaluation and Treatment: Rehabilitation  ONSET DATE: Aug 31, 2023  SUBJECTIVE:   SUBJECTIVE STATEMENT:   Doing OK, thought I was gonna die after I left here last time. It was a real workout, could barely walk and the next day I was hurting but  feeling better today   PERTINENT HISTORY: MI, stroke, back surgery (lumbar laminectomy with decompression and microdiscectomy), right THA, left TKA, former smoker and current vapor  PAIN:  Are you having pain? Yes: NPRS scale: 5/10 Pain location: Left knee, sometimes R knee hurts too  Pain description: Achy, stiff, sore, sharp Aggravating factors: Sleeping, too much WB, prolonged postures Relieving factors: Ice, oxycodone  2 x a day  PRECAUTIONS: Back  RED FLAGS: None   WEIGHT BEARING RESTRICTIONS: No  FALLS:  Has patient fallen in last 6 months? No  LIVING ENVIRONMENT: Lives with: lives with their family and lives with their spouse Lives in: Mobile home Stairs: Needs handrail Has following equipment at home: Single point cane, Environmental consultant - 2 wheeled, and Tour manager  OCCUPATION: Retired  PLOF: Independent with basic ADLs  PATIENT GOALS: Return to painting birdhouses  NEXT MD VISIT: July 16 at 1:30  OBJECTIVE:  Note: Objective measures were completed at Evaluation unless otherwise noted.  DIAGNOSTIC FINDINGS: Interval total left knee arthroplasty without evidence of hardware failure.  PATIENT SURVEYS:  Patient specific functional scale (0 unable to 10 no difficulty) 1) Sit to  standing transfers   3/10 2) Walking independently  4/10   COGNITION: Overall cognitive status: Within functional limits for tasks assessed     SENSATION: Occasionally, not now.  [name] Good does note dizziness though.  EDEMA:  Noted and not objectively assessed   LOWER EXTREMITY ROM:  Active ROM Left/Right 10/27/2023 Left 11/03/2023 Left 11/12/23 supine  Hip flexion     Hip extension     Hip abduction     Hip adduction     Hip internal rotation     Hip external rotation     Knee flexion 103/126 105 112*  Knee extension -10/-5 -7 -9*  Ankle dorsiflexion     Ankle plantarflexion     Ankle inversion     Ankle eversion      (Blank rows = not tested)  LOWER EXTREMITY MMT:  MMT  Left/Right 10/27/2023   Hip flexion    Hip extension    Hip abduction    Hip adduction    Hip internal rotation    Hip external rotation    Knee flexion    Knee extension 2+/4-   Ankle dorsiflexion    Ankle plantarflexion    Ankle inversion    Ankle eversion     (Blank rows = not tested)  GAIT: Distance walked: 200 feet Assistive device utilized: Environmental consultant - 2 wheeled Level of assistance: Complete Independence Comments: [name] Good notes she has been using an assistive device for years due to bilateral knee pain                                                                                                                              TREATMENT DATE:    11/12/23   Pre-Cor bike seat 6x8 minutes full rotations for w/u and ROM ROM update (see chart)   SAQs 2.5# 2x12 eccentric lower  Bridges 2x12  LAQs 2.5# 2x12 eccentric lower  Knee extension stretch with 2.5# seated, x3 min    11/09/2023    Therapeutic Exercises: Recumbent bike Seat 6 for 6 minutes  Seated heel slides with foot on pillow case: ext/leg press 5 sec and active knee flexion with RLE assist end range 5 sec hold 10 reps.  Seated LAQ 2# LLE and active knee flexion with contralateral LE opposing motion 10 reps 2 sets.   Bridge 10 reps 2 sets  Functional Activities: Double Leg Press 87# full extension and slow eccentric into flexion 10 x 2 sets LLE Single Leg Press 37# full extension and slow eccentric into flexion 10 x 2 sets PT demo & verbal cues on weight shift to LLE upon arising.  Pt return demo and reports less knee pain with initiating gait.    Neuromuscular re-education: Tandem balance 20 seconds LLE in front & in back;  3/4 step (heel to opposite toe) floor eyes closed 15 sec with minA Single leg stance modified activity:  placing forefoot on lower shelf of cabinet & tapping 5 times with  ea LE 2 reps.  Pt requires HHA with LUE (more assist with RLE stance than LLE)  Self-care: PT demo & verbal cues on  positioning in bed including tenting sheets off feet and pillow bw LEs for sidelying.  PT reviewed need for food when taking her pain meds.  Pt verbalized understanding.    Vaso left knee elevation medium compression 34* 10 min.     11/05/2023 Recumbent bike Seat 5 and 6 3 minutes each Quad sets with left heel prop 2 sets of 10 x 5 seconds Seated knee AAROM (right pushes left into flexion) 10 x 10 seconds Seated low load, long duration knee extension stretch with 2# hanging from just above the kneecap 4 minutes  Functional Activities: Double Leg Press 87# full extension and slow eccentric into flexion 15 x Single Leg Press 31# full extension and slow eccentric into flexion 12 x  Neuromuscular re-education: Tandem balance 8 x 20 seconds Single leg stance/Dynamic marching 2 laps (tough time with single leg stance on the right-hip abductors weakness)   11/03/2023 Recumbent bike Seat 5 and 4 2 minutes each Quad sets with left heel prop 10 x 5 seconds Seated knee AAROM (right pushes left into flexion) 5 x 10 seconds  Functional Activities: Double Leg Press 75# full extension and slow eccentric into flexion 12 x Single Leg Press 25# full extension and slow eccentric into flexion 10 x  Neuromuscular re-education: Tandem balance 10 x 20 seconds Single leg stance/Dynamic marching 2 laps   PATIENT EDUCATION:  Education details: See above Person educated: Patient and Child(ren) Education method: Explanation, Demonstration, Tactile cues, Verbal cues, and Handouts Education comprehension: verbalized understanding, returned demonstration, verbal cues required, tactile cues required, and needs further education  HOME EXERCISE PROGRAM: Access Code: [email or web address] URL: [email or web address] Date: 10/27/2023 Prepared by: Lamar Ivory  Exercises - Supine Quadricep Sets  - 5 x daily - 7 x weekly - 2 sets - 10 reps - 5 second hold - Seated Knee Flexion AAROM  - 3 x daily - 7 x  weekly - 1 sets - 10 reps - 10 seconds hold - Tandem Stance  - 2 x daily - 7 x weekly - 1 sets - 5 reps - 20 second hold  ASSESSMENT:  CLINICAL IMPRESSION:  Arrived late for appt today- updated ROM measures, then focused on strengthening as time and exercise allowed today. Will continue to challenge her as appropriate.   OBJECTIVE IMPAIRMENTS: Abnormal gait, cardiopulmonary status limiting activity, decreased activity tolerance, decreased balance, decreased endurance, decreased knowledge of condition, difficulty walking, decreased ROM, decreased strength, increased edema, impaired perceived functional ability, obesity, and pain.   ACTIVITY LIMITATIONS: carrying, lifting, bending, standing, squatting, sleeping, stairs, transfers, bed mobility, and locomotion level  PARTICIPATION LIMITATIONS: meal prep, cleaning, shopping, and community activity  PERSONAL FACTORS: MI, stroke, back surgery (lumbar laminectomy with decompression and microdiscectomy), right THA, left TKA, former smoker and current vapor are also affecting patient's functional outcome.   REHAB POTENTIAL: Good  CLINICAL DECISION MAKING: Stable/uncomplicated  EVALUATION COMPLEXITY: Low   GOALS: Goals reviewed with patient? Yes  SHORT TERM GOALS: Target date: 11/24/2023 [name] Good will be independent with her day 1 home exercise program Baseline: Started 10/27/2023 Goal status:   Ongoing  11/09/2023  2.  Improve left knee active range of motion to 0 - 5 -110 degrees Baseline: 0 - 10 - 103 degrees Goal status: Ongoing  11/09/2023   LONG TERM GOALS: Target date: 12/22/2023  Improve patient specific functional score to 5.5  Baseline: 3.5 Goal status: Ongoing  11/09/2023  2.  [name] Good will report left knee pain consistently 0-3/10 on the visual analog scale Baseline: 0-5/10 Goal status: Ongoing  11/09/2023  3.  Improve left knee active range of motion to 0 - 3 - 115 degrees Baseline: 0 - 10 - 103 degrees Goal status: Ongoing   11/09/2023  4.  Improve bilateral knee strength for quadriceps to at least 4/5 MMT Baseline: See objective Goal status: Ongoing  11/09/2023  5.  [name] Good will be independent and compliant with her long-term home maintenance exercise program at discharge Baseline: Started 10/27/2023 Goal status: Ongoing  11/09/2023   PLAN:  PT FREQUENCY: 2-3 x a week  PT DURATION: 8 weeks  PLANNED INTERVENTIONS: 97750- Physical Performance Testing, 97110-Therapeutic exercises, 97530- Therapeutic activity, 97112- Neuromuscular re-education, 97535- Self Care, 02859- Manual therapy, [phone]- Gait training, [phone]- Electrical stimulation (unattended), [id number]- Vasopneumatic device, Patient/Family education, Balance training, Stair training, Joint mobilization, and Cryotherapy  PLAN FOR NEXT SESSION:  Continue to progress functional activities.   Post TKA rehabilitation with heavy emphasis on extension active range of motion and quadriceps strength.  Progress gait and balance once active range of motion is normalized. Add exercises to HEP/expand HEP    Josette Rough, PT, DPT 11/12/23 2:28 PM

## 2023-11-16 ENCOUNTER — Encounter: Payer: Self-pay | Admitting: Physical Therapy

## 2023-11-16 ENCOUNTER — Ambulatory Visit: Admitting: Physical Therapy

## 2023-11-16 DIAGNOSIS — R600 Localized edema: Secondary | ICD-10-CM | POA: Diagnosis not present

## 2023-11-16 DIAGNOSIS — M25562 Pain in left knee: Secondary | ICD-10-CM

## 2023-11-16 DIAGNOSIS — M6281 Muscle weakness (generalized): Secondary | ICD-10-CM

## 2023-11-16 DIAGNOSIS — M25662 Stiffness of left knee, not elsewhere classified: Secondary | ICD-10-CM

## 2023-11-16 DIAGNOSIS — R262 Difficulty in walking, not elsewhere classified: Secondary | ICD-10-CM

## 2023-11-16 NOTE — Progress Notes (Deleted)
 [name], PA-C [address] [place], [place]  [address] Phone: [phone]   Gastroenterology Consultation  Referring Provider:     Kennyth Worth HERO, MD Primary Care Physician:  Kennyth Worth HERO, MD Primary Gastroenterologist:  [name], PA-C / Norleen Kiang, MD  Reason for Consultation:     Hospital follow-up of iron deficiency anemia        HPI:   [name] Good is a 73 y.o. y/o female referred for consultation & management  by Kennyth Worth HERO, MD.    Patient was admitted to [facility] 10/01/2023 until 10/06/2023 for symptomatic anemia, iron deficiency anemia, GI bleed, AVM of small bowel, duodenal diverticulum.  Initial hemoglobin 5.8g.  Hemoccult positive x 2.  Was given blood transfusions. Dr. Kiang Consulted in hospital.  EGD 10/05/2023 showed 2 small AVMs treated with APC.  Gastric biopsies negative for H. pylori.  She was advised to follow-up with GI to schedule outpatient colonoscopy.  She has never had a colonoscopy.  PMH: GERD, CVA, depression, anxiety, CAD, SMV thrombus, chronic anticoagulation, antiphospholipid syndrome, diabetes, dyslipidemia, autoimmune disease, chronic pain, obesity.  She was previously on Coumadin  and recently switched to Eliquis .  Currently takes pantoprazole  40 Mg twice daily.  Colace stool softener once daily.  Current symptoms:  10/11/2023 last lab: Hgb 8.7, MCV 88, low iron 13, iron saturation 4%.  Currently on iron supplement.  Past Medical History:  Diagnosis Date   Anxiety    Arthritis    Depression    GERD (gastroesophageal reflux disease)    Myocardial infarction (HCC) 89   no visits to cardiac dr(thomas kelly) since 92   Stroke (HCC)    Wound dehiscence    lumbar    Past Surgical History:  Procedure Laterality Date   APPENDECTOMY     17 yrs   BACK SURGERY     BREAST SURGERY Left    cyst   BUBBLE STUDY  03/12/2020   Procedure: BUBBLE STUDY;  Surgeon: Claudene Pacific, MD;  [facility];  Service:  Cardiovascular;;   CARDIAC CATHETERIZATION  27   CHOLECYSTECTOMY     73 yrs old   ESOPHAGOGASTRODUODENOSCOPY N/A 10/05/2023   Procedure: EGD (ESOPHAGOGASTRODUODENOSCOPY);  Surgeon: San Sandor GAILS, DO;  [facility]: [facility];  Service: Gastroenterology;  Laterality: N/A;   HOT HEMOSTASIS N/A 10/05/2023   Procedure: EGD, WITH ARGON PLASMA COAGULATION;  Surgeon: San Sandor GAILS, DO;  [facility]: [facility];  Service: Gastroenterology;  Laterality: N/A;   LUMBAR LAMINECTOMY/DECOMPRESSION MICRODISCECTOMY N/A 06/14/2015   Procedure: Lumbar Three-Four,Lumbar Four-Five, Lumbar Five-Sacral One Laminectomy;  Surgeon: Alm GORMAN Molt, MD;  [facility];  Service: Neurosurgery;  Laterality: N/A;   LUMBAR WOUND DEBRIDEMENT N/A 07/24/2015   Procedure: lumbar wound revision;  Surgeon: Alm GORMAN Molt, MD;  [facility];  Service: Neurosurgery;  Laterality: N/A;   TEE WITHOUT CARDIOVERSION N/A 03/12/2020   Procedure: TRANSESOPHAGEAL ECHOCARDIOGRAM (TEE);  Surgeon: Claudene Pacific, MD;  [facility] ENDOSCOPY;  Service: Cardiovascular;  Laterality: N/A;   TOTAL HIP ARTHROPLASTY Right 10/25/2015   Procedure: RIGHT TOTAL HIP ARTHROPLASTY ANTERIOR APPROACH;  Surgeon: Lonni CINDERELLA Poli, MD;  Location: [facility];  Service: Orthopedics;  Laterality: Right;   TOTAL KNEE ARTHROPLASTY Left 08/31/2023   Procedure: ARTHROPLASTY, KNEE, TOTAL;  Surgeon: Poli Lonni CINDERELLA, MD;  [facility];  Service: Orthopedics;  Laterality: Left;   TUBAL LIGATION      Prior to Admission medications   Medication Sig Start  Date End Date Taking? Authorizing Provider  apixaban  (ELIQUIS ) 2.5 MG TABS tablet Take 1 tablet (2.5 mg total) by mouth 2 (two) times daily. 10/11/23   Kennyth Worth HERO, MD  atorvastatin  (LIPITOR ) 80 MG tablet Take 1 tablet (80 mg total) by mouth daily. 10/15/23   Kennyth Worth HERO, MD  baclofen  (LIORESAL ) 10 MG tablet Take 1 tablet (10 mg total) by mouth 3 (three) times daily as needed for muscle  spasms. 09/04/23   Vernetta Lonni GRADE, MD  calcium  carbonate (TUMS EX) 750 MG chewable tablet Chew 1 tablet by mouth daily as needed for heartburn.    [provider]  diphenhydrAMINE  (BENADRYL ) 25 mg capsule Take 25 mg by mouth at bedtime.    [provider]  docusate sodium  (COLACE) 100 MG capsule Take 100 mg by mouth daily as needed for mild constipation or moderate constipation.    [provider]  escitalopram  (LEXAPRO ) 20 MG tablet TAKE 1 TABLET BY MOUTH DAILY 03/29/23   Kennyth Worth HERO, MD  ezetimibe  (ZETIA ) 10 MG tablet Take 1 tablet (10 mg total) by mouth daily. 08/05/23   Kennyth Worth HERO, MD  famotidine  (PEPCID ) 20 MG tablet Take 20 mg by mouth daily as needed for heartburn or indigestion.    [provider]  oxyCODONE -acetaminophen  (PERCOCET) 10-325 MG tablet Take 1 tablet by mouth 5 (five) times daily. 10/25/23   Kennyth Worth HERO, MD  oxyCODONE -acetaminophen  (PERCOCET/ROXICET) 5-325 MG tablet Take 1 tablet by mouth every 6 (six) hours as needed for severe pain (pain score 7-10). Patient not taking: Reported on 11/08/2023 10/13/23   Vernetta Lonni GRADE, MD  pantoprazole  (PROTONIX ) 40 MG tablet Take 1 tablet (40 mg total) by mouth 2 (two) times daily before a meal. 10/06/23 12/05/23  Rojelio Nest, DO  phentermine  (ADIPEX-P ) 37.5 MG tablet TAKE 1 TABLET BY MOUTH EVERY MORNING Patient not taking: Reported on 11/08/2023 08/02/23   Kennyth Worth HERO, MD  predniSONE  (DELTASONE ) 5 MG tablet TAKE 1 TABLET BY MOUTH DAILY 11/02/22   Kennyth Worth HERO, MD  pregabalin  (LYRICA ) 25 MG capsule Take 1 capsule (25 mg total) by mouth 2 (two) times daily. Patient not taking: Reported on 11/08/2023 10/01/23   Kennyth Worth HERO, MD  rOPINIRole  (REQUIP ) 3 MG tablet TAKE 1 TABLET BY MOUTH AT BEDTIME AS NEEDED 10/14/23   Kennyth Worth HERO, MD  triamcinolone  ointment (KENALOG ) 0.5 % Apply 1 Application topically 2 (two) times daily. 10/01/23   Kennyth Worth HERO, MD  Vitamin D , Ergocalciferol ,  (DRISDOL ) 1.25 MG (50000 UNIT) CAPS capsule Take 1 capsule (50,000 Units total) by mouth every 7 (seven) days. 10/08/23   Kennyth Worth HERO, MD    Family History  Problem Relation Age of Onset   Stroke Mother    Clotting disorder Neg Hx      Social History   Tobacco Use   Smoking status: Former    Current packs/day: 0.00    Average packs/day: 2.0 packs/day for 40.0 years (80.0 ttl pk-yrs)    Types: Cigarettes    Start date: 04/10/1973    Quit date: 04/10/2013    Years since quitting: 10.6   Smokeless tobacco: Never  Vaping Use   Vaping status: Every Day   Start date: 04/11/2013   Substances: Nicotine, Nicotine-salt  Substance Use Topics   Alcohol use: Not Currently   Drug use: No    Allergies as of 11/17/2023 - Review Complete 11/16/2023  Allergen Reaction Noted   Amoxicillin Other (See Comments) 06/11/2015  Review of Systems:    All systems reviewed and negative except where noted in HPI.   Physical Exam:  There were no vitals taken for this visit. No LMP recorded. Patient is postmenopausal.  General:   Alert,  Well-developed, well-nourished, pleasant and cooperative in NAD Lungs:  Respirations even and unlabored.  Clear throughout to auscultation.   No wheezes, crackles, or rhonchi. No acute distress. Heart:  Regular rate and rhythm; no murmurs, clicks, rubs, or gallops. Abdomen:  Normal bowel sounds.  No bruits.  Soft, and non-distended without masses, hepatosplenomegaly or hernias noted.  No Tenderness.  No guarding or rebound tenderness.    Neurologic:  Alert and oriented x3;  grossly normal neurologically. Psych:  Alert and cooperative. Normal mood and affect.  Imaging Studies: No results found.  Labs: CBC    Component Value Date/Time   WBC 5.7 10/11/2023 1124   RBC 3.22 (L) 10/11/2023 1124   HGB 8.7 (L) 10/11/2023 1124   HCT 28.6 (L) 10/11/2023 1124   PLT 246 10/11/2023 1124   MCV 88.8 10/11/2023 1124   MCH 27.0 10/11/2023 1124   MCHC 30.4 (L)  10/11/2023 1124   RDW 14.9 10/11/2023 1124   LYMPHSABS 1.0 10/01/2023 1216   MONOABS 0.3 10/01/2023 1216   EOSABS 0.0 10/01/2023 1216   BASOSABS 0.0 10/01/2023 1216    CMP     Component Value Date/Time   NA 137 10/06/2023 0516   K 3.9 10/06/2023 0516   CL 107 10/06/2023 0516   CO2 24 10/06/2023 0516   GLUCOSE 85 10/06/2023 0516   BUN 9 10/06/2023 0516   CREATININE 0.58 10/06/2023 0516   CALCIUM  8.1 (L) 10/06/2023 0516   PROT 4.9 (L) 10/06/2023 0516   ALBUMIN 2.4 (L) 10/06/2023 0516   AST 21 10/06/2023 0516   ALT 15 10/06/2023 0516   ALKPHOS 100 10/06/2023 0516   BILITOT 0.5 10/06/2023 0516   GFRNONAA >60 10/06/2023 0516   GFRAA >60 10/18/2019 0545    Assessment and Plan:   LANDIS CASSARO is a 73 y.o. 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  She received multiple blood transfusions.  Currently on iron supplement and twice daily PPI.  Coumadin  was switched to Eliquis .  She has never had a colonoscopy.  1.  Iron deficiency anemia, acute blood loss anemia 2.  Upper GI bleed  3.  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  Duodenal diverticulum 5.  Comorbidities  Plan: - Labs: CBC, iron panel, ferritin - Scheduling Colonoscopy I discussed risks of colonoscopy with patient to include risk of bleeding, colon perforation, and risk of sedation.  Patient expressed understanding and agrees to proceed with colonoscopy.  - Request cardiac clearance and permission to hold Eliquis  2 days prior to colonoscopy.  Follow up ***  [name], PA-C

## 2023-11-16 NOTE — Therapy (Addendum)
 OUTPATIENT PHYSICAL THERAPY LOWER EXTREMITY TREATMENT  Date of referral: 10/13/2023 Referring provider: Lonni CINDERELLA Poli, MD Referring diagnosis?  Diagnosis  Z96.652 (ICD-10-CM) - Status post total left knee replacement   Treatment diagnosis? (if different than referring diagnosis) R26.2   R60.0   M62.81   M25.662   M25.562  What was this (referring dx) caused by? Surgery (Type: TKA) and Arthritis  Nature of Condition: Initial Onset (within last 3 months)   Laterality: Lt  Current Functional Measure Score: Patient Specific Functional Scale 3.5  Objective measurements identify impairments when they are compared to normal values, the uninvolved extremity, and prior level of function.  [x]  Yes  []  No  Objective assessment of functional ability: Severe functional limitations   Briefly describe symptoms: Walking with a cane, poor sleep, poor function post total knee replacement  How did symptoms start: Osteoarthritis  Average pain intensity:  Last 24 hours: 0-5/10  Past week: 0-5/10  How often does the pt experience symptoms? Constantly  How much have the symptoms interfered with usual daily activities? Quite a bit  How has condition changed since care began at this facility? NA - initial visit  In general, how is the patients overall health? Good   BACK PAIN (STarT Back Screening Tool) No   Patient Name: [name] Good MRN: [id number] DOB:1950-09-17, 73 y.o., female Today's Date: 11/16/2023  END OF SESSION:  PT End of Session - 11/16/23 1349     Visit Number 6    Number of Visits 24    Date for PT Re-Evaluation 12/22/23    Authorization Type UHC Medicare    Authorization Time Period [facility] MEDICARE $15 COPAY 16 VISITS APPROVED 7/2-8/27/25    Progress Note Due on Visit 10    PT Start Time 1349    PT Stop Time 1440    PT Time Calculation (min) 51 min    Activity Tolerance Patient tolerated treatment well;No increased pain;Patient limited by pain    Behavior  During Therapy [facility] for tasks assessed/performed               Past Medical History:  Diagnosis Date   Anxiety    Arthritis    Depression    GERD (gastroesophageal reflux disease)    Myocardial infarction (HCC) 89   no visits to cardiac dr(thomas kelly) since 89   Stroke (HCC)    Wound dehiscence    lumbar   Past Surgical History:  Procedure Laterality Date   APPENDECTOMY     17 yrs   BACK SURGERY     BREAST SURGERY Left    cyst   BUBBLE STUDY  03/12/2020   Procedure: BUBBLE STUDY;  Surgeon: Claudene Pacific, MD;  Location: [facility] ENDOS[facility];  Service: Cardiovascular;;   CARDIAC CATHETERIZATION  33   CHOLECYSTECTOMY     73 yrs old   ESOPHAGOGASTRODUODENOSCOPY N/A 10/05/2023   Procedure: EGD (ESOPHAGOGASTRODUODENOSCOPY);  Surgeon: [place] Sandor GAILS, DO;  Location: [facility] ENDOSCOPY;  Service: Gastroenterology;  Laterality: N/A;   HOT HEMOSTASIS N/A 10/05/2023   Procedure: EGD, WITH ARGON PLASMA COAGULATION;  Surgeon: [place] GAILS, DO;  Location: [facility] ENDOSCOPY;  Service: Gastroenterology;  Laterality: N/A;   LUMBAR LAMINECTOMY/DECOMPRESSION MICRODISCECTOMY N/A 06/14/2015   Procedure: Lumbar Three-Four,Lumbar Four-Five, Lumbar Five-Sacral One Laminectomy;  Surgeon: Alm GORMAN Molt, MD;  Location: [facility]URO ORS;  Service: Neurosurgery;  Laterality: N/A;   LUMBAR WOUND DEBRIDEMENT N/A 07/24/2015   Procedure: lumbar wound revision;  Surgeon: Alm GORMAN Molt, MD;  Location: [facility] NEURO [facility];  Service: Neurosurgery;  Laterality: N/A;   TEE WITHOUT CARDIOVERSION N/A 03/12/2020   Procedure: TRANSESOPHAGEAL ECHOCARDIOGRAM (TEE);  Surgeon: Claudene Pacific, MD;  Location[facility] END[facility]COPY;  Service: Cardiovascular;  Laterality: N/A;   TOTAL HIP ARTHROPLASTY Right 10/25/2015   Procedure: RIGHT TOTAL HIP ARTHROPLASTY ANTERIOR APPROACH;  Surgeon: Lonni CINDERELLA Poli, MD;  Location: [facility] ORS;  Service: Orthopedics;  Laterality: Right;   TOTAL KNEE ARTHROPLASTY Left 08/31/2023   Procedure: ARTHROPLASTY, KNEE,  TOTAL;  Surgeon: Poli Lonni CINDERELLA, MD;  Location: [facility] OR;  Service: Orthopedics;  Laterality: Left;   TUBAL LIGATION     Patient Active Problem List   Diagnosis Date Noted   Iron deficiency anemia 10/04/2023   Dermatitis 10/01/2023   GI bleed 10/01/2023   OA (osteoarthritis) of knee 08/31/2023   Status post total left knee replacement 08/31/2023   Senile purpura (HCC) 08/05/2023   Supratherapeutic INR 07/05/2023   Unilateral primary osteoarthritis, right knee 06/09/2023   Vitamin D  deficiency 02/16/2023   Dyslipidemia associated with type 2 diabetes mellitus (HCC) 01/01/2023   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   History of CVA (cerebrovascular accident) 12/07/2022   T2DM (type 2 diabetes mellitus) (HCC) 08/11/2022   Chronic pain syndrome 10/30/2021   Morbid obesity (HCC) 09/02/2021   Depression, major, single episode, moderate (HCC) 07/15/2021   Vertebral artery stenosis 07/15/2021   Cerebrovascular disease 07/15/2021   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   RLS (restless legs syndrome) 10/02/2019   Arthritis 10/18/2017   Former smoker 11/13/2016   Autoimmune disease (HCC) 08/12/2016   Obesity (BMI 35.0-39.9 without comorbidity) 08/12/2016   Status post total replacement of right hip 10/25/2015   S/P lumbar laminectomy 06/14/2015    PCP: Worth CHRISTELLA Kitty, MD  REFERRING PROVIDER: Lonni CINDERELLA Poli, MD  REFERRING DIAG:  Diagnosis  [phone] (ICD-10-CM) - Status post total left knee replacement    THERAPY DIAG:  Difficulty in walking, not elsewhere classified  Muscle weakness (generalized)  Localized edema  Stiffness of left knee, not elsewhere classified  Acute pain of left knee  Rationale for Evaluation and Treatment: Rehabilitation  ONSET DATE: Aug 31, 2023  SUBJECTIVE:   SUBJECTIVE STATEMENT: Patient is overall doing well.  She reports some soreness from last PT session but no overall fatigue.  She is using a cane for household mobility  and is doing well with it.  PERTINENT HISTORY: MI, stroke, back surgery (lumbar laminectomy with decompression and microdiscectomy), right THA, left TKA, former smoker and current vapor  PAIN:  Are you having pain? Yes: NPRS scale: 6/10 while biking, minimal with standing/walking Pain location: Left knee, sometimes R knee hurts too  Pain description: Achy, stiff, sore, sharp Aggravating factors: Sleeping, too much WB, prolonged postures Relieving factors: Ice, oxycodone  2 x a day  PRECAUTIONS: Back  RED FLAGS: None   WEIGHT BEARING RESTRICTIONS: No  FALLS:  Has patient fallen in last 6 months? No  LIVING ENVIRONMENT: Lives with: lives with their family and lives with their spouse Lives in: Mobile home Stairs: Needs handrail Has following equipment at home: Single point cane, Environmental consultant - 2 wheeled, and Tour manager  OCCUPATION: Retired  PLOF: Independent with basic ADLs  PATIENT GOALS: Return to painting birdhouses  NEXT MD VISIT: July 16 at 1:30  OBJECTIVE:  Note: Objective measures were completed at Evaluation unless otherwise noted.  DIAGNOSTIC FINDINGS: Interval total left knee arthroplasty without evidence of hardware failure.  PATIENT SURVEYS:  Patient specific functional scale (0 unable to 10 no difficulty) 1) Sit to  standing transfers   3/10 2) Walking independently  4/10   COGNITION: Overall cognitive status: Within functional limits for tasks assessed     SENSATION: Occasionally, not now.  Karina does note dizziness though.  EDEMA:  Noted and not objectively assessed   LOWER EXTREMITY ROM:  Active ROM Left/Right  10/27/2023 Left  11/03/23 Left  11/12/23  supine  Hip flexion     Hip extension     Hip abduction     Hip adduction     Hip internal rotation     Hip external rotation     Knee flexion 103/126 105 112*  Knee extension -10/-5 -7 -9*  Ankle dorsiflexion     Ankle plantarflexion     Ankle inversion     Ankle eversion      (Blank rows  = not tested)  LOWER EXTREMITY MMT:  MMT Left/Right  10/27/2023   Hip flexion    Hip extension    Hip abduction    Hip adduction    Hip internal rotation    Hip external rotation    Knee flexion    Knee extension 2+/4-   Ankle dorsiflexion    Ankle plantarflexion    Ankle inversion    Ankle eversion     (Blank rows = not tested)  GAIT: Distance walked: 200 feet Assistive device utilized: Environmental consultant - 2 wheeled Level of assistance: Complete Independence Comments: Ellyana notes she has been using an assistive device for years due to bilateral knee pain  TREATMENT DATE:   11/16/23 TherEx Recumbent bike, seat 5, lvl 1, for 6 minutes BL leg press 87# 10x, 15x SL leg press 37 # 2x10 Cone taps in // bars. 2x10, discontinued due to back pain. STS from 24 stool with no UE support 10 reps Cane-assisted for end range LAQ with slow eccentric lowering, 1x10  Neuro Re-Ed Tandem stance balance: 1st rep each set RLE in front and 2nd rep each set RLE in back 30 seconds each, 1st set on the floor with eyes open, 2nd set standing on floor with eyes closed.  Moderate sway with left LE behind and with eyes closed.  Seated on 24 bar stool without back support head turns side to side and up/down to stimulate vestibular system. Eyes open and closed.  10x each side  Self-Care PT demo and verbally educated patient about focusing on a stationary object to get rid of dizziness.  Patient verbalized understanding PT demo and verbally educated about doing balance exercises between a sink and a chair for maximum safety.  Patient verbalized understanding PT demo and verbal cues on using 24 barstool with feet on floor for modified standing activities and to help build standing tolerance for ADLs.  Patient and daughter verbalized understanding.                                                                                                                            TREATMENT DATE:  11/12/23  Pre-Cor bike  seat 6x8 minutes full rotations for w/u and ROM ROM update (see chart)  SAQs 2.5# 2x12 eccentric lower  Bridges 2x12  LAQs 2.5# 2x12 eccentric lower  Knee extension stretch with 2.5# seated, x3 min    11/09/2023    Therapeutic Exercises: Recumbent bike Seat 6 for 6 minutes  Seated heel slides with foot on pillow case: ext/leg press 5 sec and active knee flexion with RLE assist end range 5 sec hold 10 reps.  Seated LAQ 2# LLE and active knee flexion with contralateral LE opposing motion 10 reps 2 sets.   Bridge 10 reps 2 sets  Functional Activities: Double Leg Press 87# full extension and slow eccentric into flexion 10 x 2 sets LLE Single Leg Press 37# full extension and slow eccentric into flexion 10 x 2 sets PT demo & verbal cues on weight shift to LLE upon arising.  Pt return demo and reports less knee pain with initiating gait.    Neuromuscular re-education: Tandem balance 20 seconds LLE in front & in back;  3/4 step (heel to opposite toe) floor eyes closed 15 sec with minA Single leg stance modified activity:  placing forefoot on lower shelf of cabinet & tapping 5 times with ea LE 2 reps.  Pt requires HHA with LUE (more assist with RLE stance than LLE)  Self-care: PT demo & verbal cues on positioning in bed including tenting sheets off feet and pillow bw LEs for sidelying.  PT reviewed need for food when taking her pain meds.  Pt verbalized understanding.    Vaso left knee elevation medium compression 34* 10 min.     11/05/2023 Recumbent bike Seat 5 and 6 3 minutes each Quad sets with left heel prop 2 sets of 10 x 5 seconds Seated knee AAROM (right pushes left into flexion) 10 x 10 seconds Seated low load, long duration knee extension stretch with 2# hanging from just above the kneecap 4 minutes  Functional Activities: Double Leg Press 87# full extension and slow eccentric into flexion 15 x Single Leg Press 31# full extension and slow eccentric into flexion 12  x  Neuromuscular re-education: Tandem balance 8 x 20 seconds Single leg stance/Dynamic marching 2 laps (tough time with single leg stance on the right-hip abductors weakness)   PATIENT EDUCATION:  Education details: See above Person educated: Patient and Child(ren) Education method: Explanation, Demonstration, Tactile cues, Verbal cues, and Handouts Education comprehension: verbalized understanding, returned demonstration, verbal cues required, tactile cues required, and needs further education  HOME EXERCISE PROGRAM: Access Code: [email or web address] URL: [email or web address] Date: 10/27/2023 Prepared by: Lamar Ivory  Exercises - Supine Quadricep Sets  - 5 x daily - 7 x weekly - 2 sets - 10 reps - 5 second hold - Seated Knee Flexion AAROM  - 3 x daily - 7 x weekly - 1 sets - 10 reps - 10 seconds hold - Tandem Stance  - 2 x daily - 7 x weekly - 1 sets - 5 reps - 20 second hold  ASSESSMENT:  CLINICAL IMPRESSION: Patient overall did well today.  She required cues for slow eccentric control with strengthening exercises. She has difficulty with balance in a narrow BOS stance, and it also triggers dizziness. Patient had difficulty with standing endurance w/ no AD.  Patient will continue to benefit from skilled physical therapy to address impairments.  OBJECTIVE IMPAIRMENTS: Abnormal gait, cardiopulmonary status limiting activity, decreased activity tolerance, decreased balance, decreased endurance, decreased knowledge of condition, difficulty walking, decreased ROM,  decreased strength, increased edema, impaired perceived functional ability, obesity, and pain.   ACTIVITY LIMITATIONS: carrying, lifting, bending, standing, squatting, sleeping, stairs, transfers, bed mobility, and locomotion level  PARTICIPATION LIMITATIONS: meal prep, cleaning, shopping, and community activity  PERSONAL FACTORS: MI, stroke, back surgery (lumbar laminectomy with decompression and microdiscectomy), right  THA, left TKA, former smoker and current vapor are also affecting patient's functional outcome.   REHAB POTENTIAL: Good  CLINICAL DECISION MAKING: Stable/uncomplicated  EVALUATION COMPLEXITY: Low   GOALS: Goals reviewed with patient? Yes  SHORT TERM GOALS: Target date: 11/24/2023 Abrie will be independent with her day 1 home exercise program Baseline: Started 10/27/2023 Goal status:   Ongoing  11/09/2023  2.  Improve left knee active range of motion to 0 - 5 -110 degrees Baseline: 0 - 10 - 103 degrees Goal status: Ongoing  11/09/2023   LONG TERM GOALS: Target date: 12/22/2023  Improve patient specific functional score to 5.5 Baseline: 3.5 Goal status: Ongoing  11/09/2023  2.  Brannon will report left knee pain consistently 0-3/10 on the visual analog scale Baseline: 0-5/10 Goal status: Ongoing  11/09/2023  3.  Improve left knee active range of motion to 0 - 3 - 115 degrees Baseline: 0 - 10 - 103 degrees Goal status: Ongoing  11/09/2023  4.  Improve bilateral knee strength for quadriceps to at least 4/5 MMT Baseline: See objective Goal status: Ongoing  11/09/2023  5.  Zenab will be independent and compliant with her long-term home maintenance exercise program at discharge Baseline: Started 10/27/2023 Goal status: Ongoing  11/09/2023   PLAN:  PT FREQUENCY: 2-3 x a week  PT DURATION: 8 weeks  PLANNED INTERVENTIONS: 97750- Physical Performance Testing, 97110-Therapeutic exercises, 97530- Therapeutic activity, 97112- Neuromuscular re-education, 97535- Self Care, [id number]- Manual therapy, [phone]- Gait training, [phone]- Electrical stimulation (unattended), [id number]- Vasopneumatic device, Patient/Family education, Balance training, Stair training, Joint mobilization, and Cryotherapy  PLAN FOR NEXT SESSION:   Expand/update HEP and progress STS. Check ROM and STGs,  Continue to progress strengthening and balance exercises.    Ismael Theophilus Stallion SPT 11/16/23 3:07 PM  This entire  session of physical therapy was performed under the direct supervision of PT signing evaluation /treatment. PT reviewed note and agrees.   Robin Waldron, PT, DPT 11/16/2023, 4:30 PM

## 2023-11-17 ENCOUNTER — Ambulatory Visit: Admitting: Physician Assistant

## 2023-11-17 ENCOUNTER — Other Ambulatory Visit: Payer: Self-pay | Admitting: Family Medicine

## 2023-11-18 ENCOUNTER — Encounter: Admitting: Rehabilitative and Restorative Service Providers"

## 2023-11-19 ENCOUNTER — Ambulatory Visit: Admitting: Rehabilitative and Restorative Service Providers"

## 2023-11-19 ENCOUNTER — Encounter: Payer: Self-pay | Admitting: Rehabilitative and Restorative Service Providers"

## 2023-11-19 DIAGNOSIS — R600 Localized edema: Secondary | ICD-10-CM | POA: Diagnosis not present

## 2023-11-19 DIAGNOSIS — M25662 Stiffness of left knee, not elsewhere classified: Secondary | ICD-10-CM | POA: Diagnosis not present

## 2023-11-19 DIAGNOSIS — M25562 Pain in left knee: Secondary | ICD-10-CM | POA: Diagnosis not present

## 2023-11-19 DIAGNOSIS — R262 Difficulty in walking, not elsewhere classified: Secondary | ICD-10-CM | POA: Diagnosis not present

## 2023-11-19 DIAGNOSIS — M6281 Muscle weakness (generalized): Secondary | ICD-10-CM

## 2023-11-19 NOTE — Therapy (Signed)
 OUTPATIENT PHYSICAL THERAPY LOWER EXTREMITY TREATMENT  Date of referral: 10/13/2023 Referring provider: Lonni CINDERELLA Poli, MD Referring diagnosis?  Diagnosis  Z96.652 (ICD-10-CM) - Status post total left knee replacement   Treatment diagnosis? (if different than referring diagnosis) R26.2   R60.0   M62.81   M25.662   M25.562  What was this (referring dx) caused by? Surgery (Type: TKA) and Arthritis  Nature of Condition: Initial Onset (within last 3 months)   Laterality: Lt  Current Functional Measure Score: Patient Specific Functional Scale 3.5  Objective measurements identify impairments when they are compared to normal values, the uninvolved extremity, and prior level of function.  [x]  Yes  []  No  Objective assessment of functional ability: Severe functional limitations   Briefly describe symptoms: Walking with a cane, poor sleep, poor function post total knee replacement  How did symptoms start: Osteoarthritis  Average pain intensity:  Last 24 hours: 0-5/10  Past week: 0-5/10  How often does the pt experience symptoms? Constantly  How much have the symptoms interfered with usual daily activities? Quite a bit  How has condition changed since care began at this facility? NA - initial visit  In general, how is the patients overall health? Good   BACK PAIN (STarT Back Screening Tool) No   Patient Name: FELICIANA NARAYAN MRN: [id number] DOB:1951/02/27, 73 y.o., female Today's Date: 11/19/2023  END OF SESSION:  PT End of Session - 11/19/23 1258     Visit Number 7    Number of Visits 24    Date for PT Re-Evaluation 12/22/23    Authorization Type [employer] Medicare    Authorization Time Period [facility] MEDICARE $15 COPAY 16 VISITS APPROVED 7/2-8/27/25    Progress Note Due on Visit 10    PT Start Time 1258    PT Stop Time 1347    PT Time Calculation (min) 49 min    Equipment Utilized During Treatment Gait belt    Activity Tolerance Patient tolerated treatment well;No  increased pain;Patient limited by pain    Behavior During Therapy [facility] for tasks assessed/performed            Past Medical History:  Diagnosis Date   Anxiety    Arthritis    Depression    GERD (gastroesophageal reflux disease)    Myocardial infarction (HCC) 89   no visits to cardiac dr(thomas kelly) since 89   Stroke (HCC)    Wound dehiscence    lumbar   Past Surgical History:  Procedure Laterality Date   APPENDECTOMY     17 yrs   BACK SURGERY     BREAST SURGERY Left    cyst   BUBBLE STUDY  03/12/2020   Procedure: BUBBLE STUDY;  Surgeon: Claudene Pacific, MD;  Location: [facility];  Service: Cardiovascular;;   CARDIAC CATHETERIZATION  75   CHOLECYSTECTOMY     73 yrs old   ESOPHAGOGASTRODUODENOSCOPY N/A 10/05/2023   Procedure: EGD (ESOPHAGOGASTRODUODENOSCOPY);  Surgeon: San Sandor GAILS, DO;  Location: [facility];  Service: Gastroenterology;  Laterality: N/A;   HOT HEMOSTASIS N/A 10/05/2023   Procedure: EGD, WITH ARGON PLASMA COAGULATION;  Surgeon: [place] Sandor GAILS, DO;  Location: [facility];  Service: Gastroenterology;  Laterality: N/A;   LUMBAR LAMINECTOMY/DECOMPRESSION MICRODISCECTOMY N/A 06/14/2015   Procedure: Lumbar Three-Four,Lumbar Four-Five, Lumbar Five-Sacral One Laminectomy;  Surgeon: Alm GORMAN Molt, MD;  Location: [facility]UR[facility];  Service: Neurosurgery;  Laterality: N/A;   LUMBAR WOUND DEBRIDEMENT N/A 07/24/2015   Procedure: lumbar wound revision;  Surgeon: Alm GORMAN Molt,  MD;  Location: [facility];  Service: Neurosurgery;  Laterality: N/A;   TEE WITHOUT CARDIOVERSION N/A 03/12/2020   Procedure: TRANSESOPHAGEAL ECHOCARDIOGRAM (TEE);  Surgeon: Claudene Pacific, MD;  Location: [facility] ENDOSCOPY;  Service: Cardiovascular;  Laterality: N/A;   TOTAL HIP ARTHROPLASTY Right 10/25/2015   Procedure: RIGHT TOTAL HIP ARTHROPLASTY ANTERIOR APPROACH;  Surgeon: Lonni CINDERELLA Poli, MD;  Location: [facility] ORS;  Service: Orthopedics;  Laterality: Right;   TOTAL KNEE ARTHROPLASTY Left  08/31/2023   Procedure: ARTHROPLASTY, KNEE, TOTAL;  Surgeon: Poli Lonni CINDERELLA, MD;  Location: [facility] OR;  Service: Orthopedics;  Laterality: Left;   TUBAL LIGATION     Patient Active Problem List   Diagnosis Date Noted   Iron deficiency anemia 10/04/2023   Dermatitis 10/01/2023   GI bleed 10/01/2023   OA (osteoarthritis) of knee 08/31/2023   Status post total left knee replacement 08/31/2023   Senile purpura (HCC) 08/05/2023   Supratherapeutic INR 07/05/2023   Unilateral primary osteoarthritis, right knee 06/09/2023   Vitamin D  deficiency 02/16/2023   Dyslipidemia associated with type 2 diabetes mellitus (HCC) 01/01/2023   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   History of CVA (cerebrovascular accident) 12/07/2022   T2DM (type 2 diabetes mellitus) (HCC) 08/11/2022   Chronic pain syndrome 10/30/2021   Morbid obesity (HCC) 09/02/2021   Depression, major, single episode, moderate (HCC) 07/15/2021   Vertebral artery stenosis 07/15/2021   Cerebrovascular disease 07/15/2021   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   RLS (restless legs syndrome) 10/02/2019   Arthritis 10/18/2017   Former smoker 11/13/2016   Autoimmune disease (HCC) 08/12/2016   Obesity (BMI 35.0-39.9 without comorbidity) 08/12/2016   Status post total replacement of right hip 10/25/2015   S/P lumbar laminectomy 06/14/2015    PCP: Worth CHRISTELLA Kitty, MD  REFERRING PROVIDER: Lonni CINDERELLA Poli, MD  REFERRING DIAG:  Diagnosis  [phone] (ICD-10-CM) - Status post total left knee replacement    THERAPY DIAG:  Difficulty in walking, not elsewhere classified  Muscle weakness (generalized)  Localized edema  Stiffness of left knee, not elsewhere classified  Acute pain of left knee  Rationale for Evaluation and Treatment: Rehabilitation  ONSET DATE: Aug 31, 2023  SUBJECTIVE:   SUBJECTIVE STATEMENT: June reports overall progress since starting supervised PT.  Left gluteus medius weakness significantly  affects gait and weight-bearing function.  PERTINENT HISTORY: MI, stroke, back surgery (lumbar laminectomy with decompression and microdiscectomy), right THA, left TKA, former smoker and current vapor  PAIN:  Are you having pain? Yes: NPRS scale: 0-5/10 this week Pain location: Left knee, sometimes Rt knee, Lt hip fatigue/weakness  Pain description: Achy, stiff, sore, sharp Aggravating factors: Sleeping, too much WB, prolonged postures Relieving factors: Ice, oxycodone  2 x a day  PRECAUTIONS: Back  RED FLAGS: None   WEIGHT BEARING RESTRICTIONS: No  FALLS:  Has patient fallen in last 6 months? No  LIVING ENVIRONMENT: Lives with: lives with their family and lives with their spouse Lives in: Mobile home Stairs: Needs handrail Has following equipment at home: Single point cane, Environmental consultant - 2 wheeled, and Tour manager  OCCUPATION: Retired  PLOF: Independent with basic ADLs  PATIENT GOALS: Return to painting birdhouses  NEXT MD VISIT: October 16 at 1:30  OBJECTIVE:  Note: Objective measures were completed at Evaluation unless otherwise noted.  DIAGNOSTIC FINDINGS: Interval total left knee arthroplasty without evidence of hardware failure.  PATIENT SURVEYS:  Patient specific functional scale (0 unable to 10 no difficulty) 1) Sit to standing transfers   3/10 2) Walking independently  4/10   COGNITION: Overall cognitive status: Within functional limits for tasks assessed     SENSATION: Occasionally, not now.  Sariyah does note dizziness though.  EDEMA:  Noted and not objectively assessed   LOWER EXTREMITY ROM:  Active ROM Left/Right  10/27/2023 Left  11/03/23 Left  11/12/23  supine Left 7/?/2025  Hip flexion      Hip extension      Hip abduction      Hip adduction      Hip internal rotation      Hip external rotation      Knee flexion 103/126 105 112*   Knee extension -10/-5 -7 -9*   Ankle dorsiflexion      Ankle plantarflexion      Ankle inversion      Ankle  eversion       (Blank rows = not tested)  LOWER EXTREMITY MMT:  MMT Left/Right  10/27/2023   Hip flexion    Hip extension    Hip abduction    Hip adduction    Hip internal rotation    Hip external rotation    Knee flexion    Knee extension 2+/4-   Ankle dorsiflexion    Ankle plantarflexion    Ankle inversion    Ankle eversion     (Blank rows = not tested)  GAIT: Distance walked: 200 feet Assistive device utilized: Environmental consultant - 2 wheeled Level of assistance: Complete Independence Comments: Anyiah notes she has been using an assistive device for years due to bilateral knee pain  TREATMENT DATE:   11/19/2023 Recumbent bike Seat 6 for 8 minutes Level 3 Resistance (extension AROM and strength emphasis)  Neuromuscular re-education: Tandem balance 8 x 20 seconds Dynamic heel to toe walking using right hand only on parallel bars  Functional Activities: Double Leg Press 75# 10 x slow eccentrics Single leg Press 25# 10 x slow eccentrics Step-up and back off 4 and 6 inch step 10 x each Step-up and over 4 inch step 10 x   11/16/23 TherEx Recumbent bike, seat 5, lvl 1, for 6 minutes BL leg press 87# 10x, 15x SL leg press 37 # 2x10 Cone taps in // bars. 2x10, discontinued due to back pain. STS from 24 stool with no UE support 10 reps Cane-assisted for end range LAQ with slow eccentric lowering, 1x10  Neuro Re-Ed Tandem stance balance: 1st rep each set RLE in front and 2nd rep each set RLE in back 30 seconds each, 1st set on the floor with eyes open, 2nd set standing on floor with eyes closed.  Moderate sway with left LE behind and with eyes closed.  Seated on 24 bar stool without back support head turns side to side and up/down to stimulate vestibular system. Eyes open and closed.  10x each side  Self-Care PT demo and verbally educated patient about focusing on a stationary object to get rid of dizziness.  Patient verbalized understanding PT demo and verbally educated about  doing balance exercises between a sink and a chair for maximum safety.  Patient verbalized understanding PT demo and verbal cues on using 24 barstool with feet on floor for modified standing activities and to help build standing tolerance for ADLs.  Patient and daughter verbalized understanding.  11/12/23 Pre-Cor bike seat 6x8 minutes full rotations for w/u and ROM ROM update (see chart)  SAQs 2.5# 2x12 eccentric lower  Bridges 2x12  LAQs 2.5# 2x12 eccentric lower  Knee extension stretch with 2.5# seated, x3 min    PATIENT EDUCATION:  Education details: See above Person educated: Patient and Child(ren) Education method: Explanation, Demonstration, Tactile cues, Verbal cues, and Handouts Education comprehension: verbalized understanding, returned demonstration, verbal cues required, tactile cues required, and needs further education  HOME EXERCISE PROGRAM: Access Code: [email or web address] URL: [email or web address] Date: 11/19/2023 Prepared by: Lamar Ivory  Exercises - Supine Quadricep Sets  - 5 x daily - 7 x weekly - 2 sets - 10 reps - 5 second hold - Seated Knee Flexion AAROM  - 3 x daily - 7 x weekly - 1 sets - 10 reps - 10 seconds hold - Tandem Stance  - 2 x daily - 7 x weekly - 1 sets - 5 reps - 20 second hold - Standing Hip Hiking  - 2-3 x daily - 7 x weekly - 2 sets - 10 reps - 3 seconds hold  ASSESSMENT:  CLINICAL IMPRESSION: June reports overall progress since starting supervised PT.  Left gluteus medius weakness significantly affects gait and weight-bearing function.  Although improving, gait quality, balance and weight-bearing endurance will benefit from continued skilled care.  OBJECTIVE IMPAIRMENTS: Abnormal gait, cardiopulmonary status limiting activity, decreased activity tolerance, decreased balance, decreased endurance, decreased knowledge of  condition, difficulty walking, decreased ROM, decreased strength, increased edema, impaired perceived functional ability, obesity, and pain.   ACTIVITY LIMITATIONS: carrying, lifting, bending, standing, squatting, sleeping, stairs, transfers, bed mobility, and locomotion level  PARTICIPATION LIMITATIONS: meal prep, cleaning, shopping, and community activity  PERSONAL FACTORS: MI, stroke, back surgery (lumbar laminectomy with decompression and microdiscectomy), right THA, left TKA, former smoker and current vapor are also affecting patient's functional outcome.   REHAB POTENTIAL: Good  CLINICAL DECISION MAKING: Stable/uncomplicated  EVALUATION COMPLEXITY: Low   GOALS: Goals reviewed with patient? Yes  SHORT TERM GOALS: Target date: 11/24/2023 Mahek will be independent with her day 1 home exercise program Baseline: Started 10/27/2023 Goal status:   Met 11/19/2023  2.  Improve left knee active range of motion to 0 - 5 -110 degrees Baseline: 0 - 10 - 103 degrees Goal status: Ongoing  11/09/2023   LONG TERM GOALS: Target date: 12/22/2023  Improve patient specific functional score to 5.5 Baseline: 3.5 Goal status: Ongoing  11/09/2023  2.  Berlynn will report left knee pain consistently 0-3/10 on the visual analog scale Baseline: 0-5/10 Goal status: Ongoing  11/19/2023  3.  Improve left knee active range of motion to 0 - 3 - 115 degrees Baseline: 0 - 10 - 103 degrees Goal status: Ongoing  11/09/2023  4.  Improve bilateral knee strength for quadriceps to at least 4/5 MMT Baseline: See objective Goal status: Ongoing  11/09/2023  5.  Saphronia will be independent and compliant with her long-term home maintenance exercise program at discharge Baseline: Started 10/27/2023 Goal status: Ongoing  11/19/2023   PLAN:  PT FREQUENCY: 2-3 x a week  PT DURATION: 8 weeks  PLANNED INTERVENTIONS: 97750- Physical Performance Testing, 97110-Therapeutic exercises, 97530- Therapeutic activity, 97112-  Neuromuscular re-education, 97535- Self Care, 02859- Manual therapy, [phone]- Gait training, [phone]- Electrical stimulation (unattended), [id number]- Vasopneumatic device, Patient/Family education, Balance training, Stair training, Joint mobilization, and Cryotherapy  PLAN FOR NEXT SESSION:   Right hip abductors and bilateral quadriceps strength work.  Extension AROM, balance and functional activities.  Myer LELON Ivory, PT, MPT 11/19/2023, 3:04 PM

## 2023-11-24 ENCOUNTER — Encounter: Payer: Self-pay | Admitting: Rehabilitative and Restorative Service Providers"

## 2023-11-24 ENCOUNTER — Ambulatory Visit: Admitting: Rehabilitative and Restorative Service Providers"

## 2023-11-24 DIAGNOSIS — M25662 Stiffness of left knee, not elsewhere classified: Secondary | ICD-10-CM

## 2023-11-24 DIAGNOSIS — M6281 Muscle weakness (generalized): Secondary | ICD-10-CM

## 2023-11-24 DIAGNOSIS — R262 Difficulty in walking, not elsewhere classified: Secondary | ICD-10-CM

## 2023-11-24 DIAGNOSIS — R600 Localized edema: Secondary | ICD-10-CM

## 2023-11-24 DIAGNOSIS — M25562 Pain in left knee: Secondary | ICD-10-CM | POA: Diagnosis not present

## 2023-11-24 NOTE — Therapy (Signed)
 OUTPATIENT PHYSICAL THERAPY TREATMENT    Patient Name: [name] Good MRN: [id number] DOB:1951/01/04, 73 y.o., female Today's Date: 11/24/2023  END OF SESSION:  PT End of Session - 11/24/23 1428     Visit Number 8    Number of Visits 24    Date for PT Re-Evaluation 12/22/23    Authorization Type [employer] Medicare    Authorization Time Period [facility] MEDICARE $15 COPAY 16 VISITS APPROVED 7/2-8/27/25    Authorization - Visit Number 8    Authorization - Number of Visits 16    Progress Note Due on Visit 10    PT Start Time 1348    PT Stop Time 1427    PT Time Calculation (min) 39 min    Equipment Utilized During Treatment Gait belt    Activity Tolerance Patient tolerated treatment well;Patient limited by fatigue    Behavior During Therapy WFL for tasks assessed/performed             Past Medical History:  Diagnosis Date   Anxiety    Arthritis    Depression    GERD (gastroesophageal reflux disease)    Myocardial infarction (HCC) 89   no visits to cardiac dr(thomas kelly) since 89   Stroke (HCC)    Wound dehiscence    lumbar   Past Surgical History:  Procedure Laterality Date   APPENDECTOMY     17 yrs   BACK SURGERY     BREAST SURGERY Left    cyst   BUBBLE STUDY  03/12/2020   Procedure: BUBBLE STUDY;  Surgeon: Claudene Pacific, MD;  Location: [facility];  Service: Cardiovascular;;   CARDIAC CATHETERIZATION  33   CHOLECYSTECTOMY     73 yrs old   ESOPHAGOGASTRODUODENOSCOPY N/A 10/05/2023   Procedure: EGD (ESOPHAGOGASTRODUODENOSCOPY);  Surgeon: San Sandor GAILS, DO;  Location: [facility];  Service: Gastroenterology;  Laterality: N/A;   HOT HEMOSTASIS N/A 10/05/2023   Procedure: EGD, WITH ARGON PLASMA COAGULATION;  Surgeon: [place] Sandor GAILS, DO;  Location: [facility];  Service: Gastroenterology;  Laterality: N/A;   LUMBAR LAMINECTOMY/DECOMPRESSION MICRODISCECTOMY N/A 06/14/2015   Procedure: Lumbar Three-Four,Lumbar Four-Five, Lumbar Five-Sacral One Laminectomy;   Surgeon: Alm GORMAN Molt, MD;  Location: [facility];  Service: Neurosurgery;  Laterality: N/A;   LUMBAR WOUND DEBRIDEMENT N/A 07/24/2015   Procedure: lumbar wound revision;  Surgeon: Alm GORMAN Molt, MD;  Location: [facility];  Service: Neurosurgery;  Laterality: N/A;   TEE WITHOUT CARDIOVERSION N/A 03/12/2020   Procedure: TRANSESOPHAGEAL ECHOCARDIOGRAM (TEE);  Surgeon: Claudene Pacific, MD;  Location: [facility] ENDOSCOPY;  Service: Cardiovascular;  Laterality: N/A;   TOTAL HIP ARTHROPLASTY Right 10/25/2015   Procedure: RIGHT TOTAL HIP ARTHROPLASTY ANTERIOR APPROACH;  Surgeon: Lonni CINDERELLA Poli, MD;  Location: [facility];  Service: Orthopedics;  Laterality: Right;   TOTAL KNEE ARTHROPLASTY Left 08/31/2023   Procedure: ARTHROPLASTY, KNEE, TOTAL;  Surgeon: Poli Lonni CINDERELLA, MD;  Location: [facility] OR;  Service: Orthopedics;  Laterality: Left;   TUBAL LIGATION     Patient Active Problem List   Diagnosis Date Noted   Iron deficiency anemia 10/04/2023   Dermatitis 10/01/2023   GI bleed 10/01/2023   OA (osteoarthritis) of knee 08/31/2023   Status post total left knee replacement 08/31/2023   Senile purpura (HCC) 08/05/2023   Supratherapeutic INR 07/05/2023   Unilateral primary osteoarthritis, right knee 06/09/2023   Vitamin D  deficiency 02/16/2023   Dyslipidemia associated with type 2 diabetes mellitus (HCC) 01/01/2023   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   History of CVA (cerebrovascular  accident) 12/07/2022   T2DM (type 2 diabetes mellitus) (HCC) 08/11/2022   Chronic pain syndrome 10/30/2021   Morbid obesity (HCC) 09/02/2021   Depression, major, single episode, moderate (HCC) 07/15/2021   Vertebral artery stenosis 07/15/2021   Cerebrovascular disease 07/15/2021   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   RLS (restless legs syndrome) 10/02/2019   Arthritis 10/18/2017   Former smoker 11/13/2016   Autoimmune disease (HCC) 08/12/2016   Obesity (BMI 35.0-39.9 without comorbidity) 08/12/2016    Status post total replacement of right hip 10/25/2015   S/P lumbar laminectomy 06/14/2015    PCP: Worth CHRISTELLA Kitty, MD  REFERRING PROVIDER: Lonni CINDERELLA Poli, MD  REFERRING DIAG:  Diagnosis  [phone] (ICD-10-CM) - Status post total left knee replacement    THERAPY DIAG:  Difficulty in walking, not elsewhere classified  Muscle weakness (generalized)  Localized edema  Stiffness of left knee, not elsewhere classified  Acute pain of left knee  Rationale for Evaluation and Treatment: Rehabilitation  ONSET DATE: Aug 31, 2023  SUBJECTIVE:   SUBJECTIVE STATEMENT:  Pt indicated feeling increased fatigue and soreness after last visit for several days.  Reported having difficulty with pressure on Rt leg due to past medical history.  Indicated cane in Lt hand felt better.    PERTINENT HISTORY: MI, stroke, back surgery (lumbar laminectomy with decompression and microdiscectomy), right THA, left TKA, former smoker and current vapor  PAIN:  Are you having pain? Yes: NPRS scale: 0-5/10 this week Pain location: Left knee, sometimes Rt knee, Lt hip fatigue/weakness  Pain description: Achy, stiff, sore, sharp Aggravating factors: Sleeping, too much WB, prolonged postures Relieving factors: Ice, oxycodone  2 x a day  PRECAUTIONS: Back  RED FLAGS: None   WEIGHT BEARING RESTRICTIONS: No  FALLS:  Has patient fallen in last 6 months? No  LIVING ENVIRONMENT: Lives with: lives with their family and lives with their spouse Lives in: Mobile home Stairs: Needs handrail Has following equipment at home: Single point cane, Environmental consultant - 2 wheeled, and Tour manager  OCCUPATION: Retired  PLOF: Independent with basic ADLs  PATIENT GOALS: Return to painting birdhouses  NEXT MD VISIT: October 16 at 1:30  OBJECTIVE:  Note: Objective measures were completed at Evaluation unless otherwise noted.  DIAGNOSTIC FINDINGS: Interval total left knee arthroplasty without evidence of  hardware failure.  PATIENT SURVEYS:   Patient-Specific Activity Scoring Scheme  0 represents "unable to perform." 10 represents "able to perform at prior level. 0 1 2 3 4 5 6 7 8 9  10 (Date and Score)   Activity Eval  10/27/2023    1.  Sit to standing transfers   3     2. Walking independently  4    3.     4.    5.    Score 3.5    Total score = sum of the activity scores/number of activities Minimum detectable change (90%CI) for average score = 2 points Minimum detectable change (90%CI) for single activity score = 3 points  COGNITION: 10/27/2023 Overall cognitive status: Within functional limits for tasks assessed     SENSATION: .10/27/2023 Occasionally, not now.  [name] Good does note dizziness though.  EDEMA:  10/27/2023 Noted and not objectively assessed   LOWER EXTREMITY ROM:  Active ROM Left/Right  10/27/2023 Left  11/03/23 Left  11/12/23  supine Left   Hip flexion      Hip extension      Hip abduction      Hip adduction      Hip internal  rotation      Hip external rotation      Knee flexion 103/126 105 112*   Knee extension -10/-5 -7 -9*   Ankle dorsiflexion      Ankle plantarflexion      Ankle inversion      Ankle eversion       (Blank rows = not tested)  LOWER EXTREMITY MMT:  MMT Right  10/27/2023 Left 10/27/2023  Hip flexion    Hip extension    Hip abduction    Hip adduction    Hip internal rotation    Hip external rotation    Knee flexion    Knee extension 4- 2+  Ankle dorsiflexion    Ankle plantarflexion    Ankle inversion    Ankle eversion     (Blank rows = not tested)  GAIT: 11/24/2023: FWW into clinic.  Trial of SPC in Lt hand to help Rt leg improved steadiness in gait despite Lt knee surgery.  This is due to past troubles with Rt leg.   Eval:  Distance walked: 200 feet Assistive device utilized: Environmental consultant - 2 wheeled Level of assistance: Complete Independence Comments: [name] Good notes she has been using an assistive device for years due to  bilateral knee pain                      TREATMENT        DATE:  11/24/2023 Therex: Recumbent bike Lvl 2-3 8 mins  Seated Lt leg LAQ with end range pauses each direction 2.5 lbs 2 x 10  Standing alternating DF/PF with light hand assist on bar x 10 each way with cues throughout for techniques.    TherActivity: CGA with ambulation c SPC in Lt UE with ramp and curb up/down.  Moderate Assist required in lowering with WB on Lt leg for step down 6 inch.  Sit to stand to sit 18 inch chair with foam pad no UE assist x 5   Gait Training Trial of SPC in Rt hand showed difficulty in Rt leg WB.  Best ambulation strategy showed with use with SPC in Lt hand to offload Rt.   Cues for cane placement given.  Performance of household distances 100 ft x 2.  Discussion about importance of walker /SPC use vs. Furniture cruising at home to limit fall risk.    TREATMENT        DATE:  11/19/2023 Recumbent bike Seat 6 for 8 minutes Level 3 Resistance (extension AROM and strength emphasis)  Neuromuscular re-education: Tandem balance 8 x 20 seconds Dynamic heel to toe walking using right hand only on parallel bars  Functional Activities: Double Leg Press 75# 10 x slow eccentrics Single leg Press 25# 10 x slow eccentrics Step-up and back off 4 and 6 inch step 10 x each Step-up and over 4 inch step 10 x   TREATMENT        DATE:  11/16/23 TherEx Recumbent bike, seat 5, lvl 1, for 6 minutes BL leg press 87# 10x, 15x SL leg press 37 # 2x10 Cone taps in // bars. 2x10, discontinued due to back pain. STS from 24 stool with no UE support 10 reps Cane-assisted for end range LAQ with slow eccentric lowering, 1x10  Neuro Re-Ed Tandem stance balance: 1st rep each set RLE in front and 2nd rep each set RLE in back 30 seconds each, 1st set on the floor with eyes open, 2nd set standing on floor with eyes closed.  Moderate  sway with left LE behind and with eyes closed.  Seated on 24 bar stool without back support  head turns side to side and up/down to stimulate vestibular system. Eyes open and closed.  10x each side  Self-Care PT demo and verbally educated patient about focusing on a stationary object to get rid of dizziness.  Patient verbalized understanding PT demo and verbally educated about doing balance exercises between a sink and a chair for maximum safety.  Patient verbalized understanding PT demo and verbal cues on using 24 barstool with feet on floor for modified standing activities and to help build standing tolerance for ADLs.  Patient and daughter verbalized understanding.    PATIENT EDUCATION:  Eval  Education details: See above Person educated: Patient and Child(ren) Education method: Explanation, Demonstration, Tactile cues, Verbal cues, and Handouts Education comprehension: verbalized understanding, returned demonstration, verbal cues required, tactile cues required, and needs further education  HOME EXERCISE PROGRAM: Access Code: [email or web address] URL: [email or web address] Date: 11/19/2023 Prepared by: Lamar Ivory  Exercises - Supine Quadricep Sets  - 5 x daily - 7 x weekly - 2 sets - 10 reps - 5 second hold - Seated Knee Flexion AAROM  - 3 x daily - 7 x weekly - 1 sets - 10 reps - 10 seconds hold - Tandem Stance  - 2 x daily - 7 x weekly - 1 sets - 5 reps - 20 second hold - Standing Hip Hiking  - 2-3 x daily - 7 x weekly - 2 sets - 10 reps - 3 seconds hold  ASSESSMENT:  CLINICAL IMPRESSION:  Presentation in WB showed more difficulty in WB control on Rt leg vs. Lt, resulting in recommendation for cane use in Lt hand.  SBA/CGA was required for safety.  Discussed findings and techniques of ambulation with patient and family member. Continued skilled PT services indicated at this time. Today was adjusted to avoid increases in strengthening activity due to fatigue/symptoms noted post activity last visit. May return to progressions as able.   OBJECTIVE IMPAIRMENTS:  Abnormal gait, cardiopulmonary status limiting activity, decreased activity tolerance, decreased balance, decreased endurance, decreased knowledge of condition, difficulty walking, decreased ROM, decreased strength, increased edema, impaired perceived functional ability, obesity, and pain.   ACTIVITY LIMITATIONS: carrying, lifting, bending, standing, squatting, sleeping, stairs, transfers, bed mobility, and locomotion level  PARTICIPATION LIMITATIONS: meal prep, cleaning, shopping, and community activity  PERSONAL FACTORS: MI, stroke, back surgery (lumbar laminectomy with decompression and microdiscectomy), right THA, left TKA, former smoker and current vapor are also affecting patient's functional outcome.   REHAB POTENTIAL: Good  CLINICAL DECISION MAKING: Stable/uncomplicated  EVALUATION COMPLEXITY: Low   GOALS: Goals reviewed with patient? Yes  SHORT TERM GOALS: Target date: 11/24/2023 [name] Good will be independent with her day 1 home exercise program Baseline: Started 10/27/2023 Goal status:   Met 11/19/2023  2.  Improve left knee active range of motion to 0 - 5 -110 degrees Baseline: 0 - 10 - 103 degrees Goal status: Ongoing  11/09/2023   LONG TERM GOALS: Target date: 12/22/2023  Improve patient specific functional score to 5.5 Baseline: 3.5 Goal status: Ongoing  11/09/2023  2.  [name] Good will report left knee pain consistently 0-3/10 on the visual analog scale Baseline: 0-5/10 Goal status: Ongoing  11/19/2023  3.  Improve left knee active range of motion to 0 - 3 - 115 degrees Baseline: 0 - 10 - 103 degrees Goal status: Ongoing  11/09/2023  4.  Improve bilateral knee strength for quadriceps to at least  4/5 MMT Baseline: See objective Goal status: Ongoing  11/09/2023  5.  [name] Good will be independent and compliant with her long-term home maintenance exercise program at discharge Baseline: Started 10/27/2023 Goal status: Ongoing  11/19/2023   PLAN:  PT FREQUENCY: 2-3 x a  week  PT DURATION: 8 weeks  PLANNED INTERVENTIONS: 97750- Physical Performance Testing, 97110-Therapeutic exercises, 97530- Therapeutic activity, 97112- Neuromuscular re-education, 97535- Self Care, 02859- Manual therapy, [phone]- Gait training, [phone]- Electrical stimulation (unattended), 97016- Vasopneumatic device, Patient/Family education, Balance training, Stair training, Joint mobilization, and Cryotherapy  PLAN FOR NEXT SESSION: Functional strength/balance improvements.   SPC trials as able, use in Lt hand to help improve Rt leg stability as noted today recommended and in agreement with patient preferences due to Rt leg troubles.     Ozell Silvan, PT, DPT, OCS, ATC 11/24/23  2:33 PM     Date of referral: 10/13/2023 Referring provider: Lonni CINDERELLA Poli, MD Referring diagnosis?  Diagnosis  Z96.652 (ICD-10-CM) - Status post total left knee replacement   Treatment diagnosis? (if different than referring diagnosis) R26.2   R60.0   M62.81   M25.662   M25.562  What was this (referring dx) caused by? Surgery (Type: TKA) and Arthritis  Nature of Condition: Initial Onset (within last 3 months)   Laterality: Lt  Current Functional Measure Score: Patient Specific Functional Scale 3.5  Objective measurements identify impairments when they are compared to normal values, the uninvolved extremity, and prior level of function.  [x]  Yes  []  No  Objective assessment of functional ability: Severe functional limitations   Briefly describe symptoms: Walking with a cane, poor sleep, poor function post total knee replacement  How did symptoms start: Osteoarthritis  Average pain intensity:  Last 24 hours: 0-5/10  Past week: 0-5/10  How often does the pt experience symptoms? Constantly  How much have the symptoms interfered with usual daily activities? Quite a bit  How has condition changed since care began at this facility? NA - initial visit  In general, how is the patients  overall health? Good   BACK PAIN (STarT Back Screening Tool) No

## 2023-11-26 ENCOUNTER — Encounter: Payer: Self-pay | Admitting: Rehabilitative and Restorative Service Providers"

## 2023-11-26 ENCOUNTER — Ambulatory Visit (INDEPENDENT_AMBULATORY_CARE_PROVIDER_SITE_OTHER): Admitting: Rehabilitative and Restorative Service Providers"

## 2023-11-26 DIAGNOSIS — R262 Difficulty in walking, not elsewhere classified: Secondary | ICD-10-CM | POA: Diagnosis not present

## 2023-11-26 DIAGNOSIS — M6281 Muscle weakness (generalized): Secondary | ICD-10-CM | POA: Diagnosis not present

## 2023-11-26 DIAGNOSIS — M25562 Pain in left knee: Secondary | ICD-10-CM | POA: Diagnosis not present

## 2023-11-26 DIAGNOSIS — R600 Localized edema: Secondary | ICD-10-CM

## 2023-11-26 DIAGNOSIS — M25662 Stiffness of left knee, not elsewhere classified: Secondary | ICD-10-CM | POA: Diagnosis not present

## 2023-11-26 NOTE — Therapy (Signed)
 OUTPATIENT PHYSICAL THERAPY TREATMENT NOTE    Patient Name: [name] Good MRN: [id number] DOB:1950/09/26, 73 y.o., female Today's Date: 11/26/2023  END OF SESSION:  PT End of Session - 11/26/23 1330     Visit Number 9    Number of Visits 24    Date for PT Re-Evaluation 12/22/23    Authorization Type [employer] Medicare    Authorization Time Period [facility] MEDICARE $15 COPAY 16 VISITS APPROVED 7/2-8/27/25    Authorization - Number of Visits 16    Progress Note Due on Visit 10    PT Start Time 1330    PT Stop Time 1424    PT Time Calculation (min) 54 min    Equipment Utilized During Treatment Gait belt    Activity Tolerance Patient tolerated treatment well;Patient limited by fatigue;No increased pain    Behavior During Therapy WFL for tasks assessed/performed           Past Medical History:  Diagnosis Date   Anxiety    Arthritis    Depression    GERD (gastroesophageal reflux disease)    Myocardial infarction (HCC) 89   no visits to cardiac dr(thomas kelly) since 89   Stroke (HCC)    Wound dehiscence    lumbar   Past Surgical History:  Procedure Laterality Date   APPENDECTOMY     17 yrs   BACK SURGERY     BREAST SURGERY Left    cyst   BUBBLE STUDY  03/12/2020   Procedure: BUBBLE STUDY;  Surgeon: Claudene Pacific, MD;  Location: [facility];  Service: Cardiovascular;;   CARDIAC CATHETERIZATION  5   CHOLECYSTECTOMY     73 yrs old   ESOPHAGOGASTRODUODENOSCOPY N/A 10/05/2023   Procedure: EGD (ESOPHAGOGASTRODUODENOSCOPY);  Surgeon: San Sandor GAILS, DO;  Location: [facility];  Service: Gastroenterology;  Laterality: N/A;   HOT HEMOSTASIS N/A 10/05/2023   Procedure: EGD, WITH ARGON PLASMA COAGULATION;  Surgeon: San Sandor GAILS, DO;  Location: [facility];  Service: Gastroenterology;  Laterality: N/A;   LUMBAR LAMINECTOMY/DECOMPRESSION MICRODISCECTOMY N/A 06/14/2015   Procedure: Lumbar Three-Four,Lumbar Four-Five, Lumbar Five-Sacral One Laminectomy;  Surgeon: Alm GORMAN Molt,  MD;  Location: [facility];  Service: Neurosurgery;  Laterality: N/A;   LUMBAR WOUND DEBRIDEMENT N/A 07/24/2015   Procedure: lumbar wound revision;  Surgeon: Alm GORMAN Molt, MD;  Location: [facility];  Service: Neurosurgery;  Laterality: N/A;   TEE WITHOUT CARDIOVERSION N/A 03/12/2020   Procedure: TRANSESOPHAGEAL ECHOCARDIOGRAM (TEE);  Surgeon: Claudene Pacific, MD;  Location[facility] END[facility]COPY;  Service: Cardiovascular;  Laterality: N/A;   TOTAL HIP ARTHROPLASTY Right 10/25/2015   Procedure: RIGHT TOTAL HIP ARTHROPLASTY ANTERIOR APPROACH;  Surgeon: Lonni CINDERELLA Poli, MD;  Location: [facility] ORS;  Service: Orthopedics;  Laterality: Right;   TOTAL KNEE ARTHROPLASTY Left 08/31/2023   Procedure: ARTHROPLASTY, KNEE, TOTAL;  Surgeon: Poli Lonni CINDERELLA, MD;  Location: [facility] OR;  Service: Orthopedics;  Laterality: Left;   TUBAL LIGATION     Patient Active Problem List   Diagnosis Date Noted   Iron deficiency anemia 10/04/2023   Dermatitis 10/01/2023   GI bleed 10/01/2023   OA (osteoarthritis) of knee 08/31/2023   Status post total left knee replacement 08/31/2023   Senile purpura (HCC) 08/05/2023   Supratherapeutic INR 07/05/2023   Unilateral primary osteoarthritis, right knee 06/09/2023   Vitamin D  deficiency 02/16/2023   Dyslipidemia associated with type 2 diabetes mellitus (HCC) 01/01/2023   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   History of CVA (cerebrovascular accident) 12/07/2022   T2DM (type 2  diabetes mellitus) (HCC) 08/11/2022   Chronic pain syndrome 10/30/2021   Morbid obesity (HCC) 09/02/2021   Depression, major, single episode, moderate (HCC) 07/15/2021   Vertebral artery stenosis 07/15/2021   Cerebrovascular disease 07/15/2021   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   RLS (restless legs syndrome) 10/02/2019   Arthritis 10/18/2017   Former smoker 11/13/2016   Autoimmune disease (HCC) 08/12/2016   Obesity (BMI 35.0-39.9 without comorbidity) 08/12/2016   Status post total  replacement of right hip 10/25/2015   S/P lumbar laminectomy 06/14/2015    PCP: Worth CHRISTELLA Kitty, MD  REFERRING PROVIDER: Lonni CINDERELLA Poli, MD  REFERRING DIAG:  Diagnosis  [phone] (ICD-10-CM) - Status post total left knee replacement    THERAPY DIAG:  Difficulty in walking, not elsewhere classified  Muscle weakness (generalized)  Localized edema  Stiffness of left knee, not elsewhere classified  Acute pain of left knee  Rationale for Evaluation and Treatment: Rehabilitation  ONSET DATE: Aug 31, 2023  SUBJECTIVE:   SUBJECTIVE STATEMENT:  Right leg muscle soreness with her increase in activity.  Pain meds PRN (at least 2 a day).  Sleeping has been difficult due to her right leg itching.    PERTINENT HISTORY: MI, stroke, back surgery (lumbar laminectomy with decompression and microdiscectomy), right THA, left TKA, former smoker and current vapor  PAIN:  Are you having pain? Yes: NPRS scale: 0-5/10 this week Pain location: Left knee, sometimes Rt knee, Lt hip fatigue/weakness  Pain description: Achy, stiff, sore, sharp Aggravating factors: Sleeping, too much WB, prolonged postures Relieving factors: Ice, oxycodone  2 x a day  PRECAUTIONS: Back  RED FLAGS: None   WEIGHT BEARING RESTRICTIONS: No  FALLS:  Has patient fallen in last 6 months? No  LIVING ENVIRONMENT: Lives with: lives with their family and lives with their spouse Lives in: Mobile home Stairs: Needs handrail Has following equipment at home: Single point cane, Environmental consultant - 2 wheeled, and Tour manager  OCCUPATION: Retired  PLOF: Independent with basic ADLs  PATIENT GOALS: Return to painting birdhouses  NEXT MD VISIT: October 16 at 1:30  OBJECTIVE:  Note: Objective measures were completed at Evaluation unless otherwise noted.  DIAGNOSTIC FINDINGS: Interval total left knee arthroplasty without evidence of hardware failure.  PATIENT SURVEYS:   Patient-Specific Activity Scoring Scheme  0  represents "unable to perform." 10 represents "able to perform at prior level. 0 1 2 3 4 5 6 7 8 9  10 (Date and Score)   Activity Eval  10/27/2023 11/26/2023   1.  Sit to standing transfers   3  7.5   2. Walking independently  4  5  3.     4.    5.    Score 3.5 6.25   Total score = sum of the activity scores/number of activities Minimum detectable change (90%CI) for average score = 2 points Minimum detectable change (90%CI) for single activity score = 3 points  COGNITION: 10/27/2023 Overall cognitive status: Within functional limits for tasks assessed     SENSATION: .10/27/2023 Occasionally, not now.  Kree does note dizziness though.  EDEMA:  10/27/2023 Noted and not objectively assessed   LOWER EXTREMITY ROM:  Active ROM Left/Right  10/27/2023 Left  11/03/23 Left  11/12/23  supine Left 11/26/2023   Hip flexion      Hip extension      Hip abduction      Hip adduction      Hip internal rotation      Hip external rotation  Knee flexion 103/126 105 112* 110  Knee extension -10/-5 -7 -9* -4  Ankle dorsiflexion      Ankle plantarflexion      Ankle inversion      Ankle eversion       (Blank rows = not tested)  LOWER EXTREMITY MMT:  MMT Right  10/27/2023 Left 10/27/2023 Left/Right 11/26/2023  Hip flexion     Hip extension     Hip abduction     Hip adduction     Hip internal rotation     Hip external rotation     Knee flexion     Knee extension 4- 2+ 50.5/53.2  Ankle dorsiflexion     Ankle plantarflexion     Ankle inversion     Ankle eversion      (Blank rows = not tested)  GAIT: 11/24/2023: FWW into clinic.  Trial of SPC in Lt hand to help Rt leg improved steadiness in gait despite Lt knee surgery.  This is due to past troubles with Rt leg.   Eval:  Distance walked: 200 feet Assistive device utilized: Environmental consultant - 2 wheeled Level of assistance: Complete Independence Comments: Natalina notes she has been using an assistive device for years due to bilateral knee  pain                      TREATMENT        DATE:   11/26/2023 Recumbent bike Seat 4 for 5 minutes Level 3 Quad sets 2 sets of 10 for 5 seconds Side-lie clams (lie left, lift right) 2 sets of 10 for 3 seconds Knee flexion AAROM 10 x 10 seconds  Neuromuscular re-education: Tandem balance 8 x 20 seconds Dynamic balance with cane (long strides, head up)   11/24/2023 Therex: Recumbent bike Lvl 2-3 8 mins  Seated Lt leg LAQ with end range pauses each direction 2.5 lbs 2 x 10  Standing alternating DF/PF with light hand assist on bar x 10 each way with cues throughout for techniques.   TherActivity: CGA with ambulation c SPC in Lt UE with ramp and curb up/down.  Moderate Assist required in lowering with WB on Lt leg for step down 6 inch.  Sit to stand to sit 18 inch chair with foam pad no UE assist x 5   Gait Training Trial of SPC in Rt hand showed difficulty in Rt leg WB.  Best ambulation strategy showed with use with SPC in Lt hand to offload Rt.   Cues for cane placement given.  Performance of household distances 100 ft x 2.  Discussion about importance of walker /SPC use vs. Furniture cruising at home to limit fall risk.    TREATMENT        DATE:  11/19/2023 Recumbent bike Seat 6 for 8 minutes Level 3 Resistance (extension AROM and strength emphasis)  Neuromuscular re-education: Tandem balance 8 x 20 seconds Dynamic heel to toe walking using right hand only on parallel bars  Functional Activities: Double Leg Press 75# 10 x slow eccentrics Single leg Press 25# 10 x slow eccentrics Step-up and back off 4 and 6 inch step 10 x each Step-up and over 4 inch step 10 x   TREATMENT        DATE:  11/16/23 TherEx Recumbent bike, seat 5, lvl 1, for 6 minutes BL leg press 87# 10x, 15x SL leg press 37 # 2x10 [facility] taps in // bars. 2x10, discontinued due to back pain. STS from 24 stool  with no UE support 10 reps Cane-assisted for end range LAQ with slow eccentric lowering, 1x10  Neuro  Re-Ed Tandem stance balance: 1st rep each set RLE in front and 2nd rep each set RLE in back 30 seconds each, 1st set on the floor with eyes open, 2nd set standing on floor with eyes closed.  Moderate sway with left LE behind and with eyes closed.  Seated on 24 bar stool without back support head turns side to side and up/down to stimulate vestibular system. Eyes open and closed.  10x each side  Self-Care PT demo and verbally educated patient about focusing on a stationary object to get rid of dizziness.  Patient verbalized understanding PT demo and verbally educated about doing balance exercises between a sink and a chair for maximum safety.  Patient verbalized understanding PT demo and verbal cues on using 24 barstool with feet on floor for modified standing activities and to help build standing tolerance for ADLs.  Patient and daughter verbalized understanding.    PATIENT EDUCATION:  Eval  Education details: See above Person educated: Patient and Child(ren) Education method: Explanation, Demonstration, Tactile cues, Verbal cues, and Handouts Education comprehension: verbalized understanding, returned demonstration, verbal cues required, tactile cues required, and needs further education  HOME EXERCISE PROGRAM: Access Code: [email or web address] URL: [email or web address] Date: 11/26/2023 Prepared by: Lamar Ivory  Exercises - Supine Quadricep Sets  - 3 x daily - 7 x weekly - 2 sets - 10 reps - 5 second hold - Seated Knee Flexion AAROM  - 3 x daily - 7 x weekly - 1 sets - 5 reps - 10 seconds hold - Tandem Stance  - 1 x daily - 7 x weekly - 1 sets - 5 reps - 20 second hold - Standing Hip Hiking  - 2 x daily - 7 x weekly - 2 sets - 10 reps - 3 seconds hold - Clamshell  - 3 x daily - 7 x weekly - 2 sets - 10 reps - 3 seconds hold  ASSESSMENT:  CLINICAL IMPRESSION:  June did a good job with her balance and strength activities today.  Right hip abductors, bilateral quadriceps and  general lower extremity strength deficits are being addressed with her current program and will benefit from continued attention.  Gait, balance and endurance activities should also help June meet long-term goals.  OBJECTIVE IMPAIRMENTS: Abnormal gait, cardiopulmonary status limiting activity, decreased activity tolerance, decreased balance, decreased endurance, decreased knowledge of condition, difficulty walking, decreased ROM, decreased strength, increased edema, impaired perceived functional ability, obesity, and pain.   ACTIVITY LIMITATIONS: carrying, lifting, bending, standing, squatting, sleeping, stairs, transfers, bed mobility, and locomotion level  PARTICIPATION LIMITATIONS: meal prep, cleaning, shopping, and community activity  PERSONAL FACTORS: MI, stroke, back surgery (lumbar laminectomy with decompression and microdiscectomy), right THA, left TKA, former smoker and current vapor are also affecting patient's functional outcome.   REHAB POTENTIAL: Good  CLINICAL DECISION MAKING: Stable/uncomplicated  EVALUATION COMPLEXITY: Low   GOALS: Goals reviewed with patient? Yes  SHORT TERM GOALS: Target date: 11/24/2023 Beyounce will be independent with her day 1 home exercise program Baseline: Started 10/27/2023 Goal status:   Met 11/19/2023  2.  Improve left knee active range of motion to 0 - 5 -110 degrees Baseline: 0 - 10 - 103 degrees Goal status: Met 11/26/2023   LONG TERM GOALS: Target date: 12/22/2023  Improve patient specific functional score to 5.5 Baseline: 3.5 Goal status: Met 11/26/2023  2.  Deah will report left knee pain consistently  0-3/10 on the visual analog scale Baseline: 0-5/10 Goal status: Ongoing 11/26/2023  3.  Improve left knee active range of motion to 0 - 3 - 115 degrees Baseline: 0 - 10 - 103 degrees Goal status: Ongoing  11/26/2023  4.  Improve bilateral knee strength for quadriceps to at least 4/5 MMT Baseline: See objective Goal status: Met  11/26/2023  5.  Sabriya will be independent and compliant with her long-term home maintenance exercise program at discharge Baseline: Started 10/27/2023 Goal status: Ongoing  11/26/2023   PLAN:  PT FREQUENCY: 2-3 x a week  PT DURATION: 8 weeks  PLANNED INTERVENTIONS: 97750- Physical Performance Testing, 97110-Therapeutic exercises, 97530- Therapeutic activity, 97112- Neuromuscular re-education, 97535- Self Care, [id number]- Manual therapy, [phone]- Gait training, [phone]- Electrical stimulation (unattended), [id number]- Vasopneumatic device, Patient/Family education, Balance training, Stair training, Joint mobilization, and Cryotherapy  PLAN FOR NEXT SESSION:   SPC in Lt hand to help improve Rt leg stability as Rt abduction weakness is most limiting.  Progress note due (Objective already done).   Myer LELON Ivory PT, MPT 11/26/23  2:30 PM     Date of referral: 10/13/2023 Referring provider: Lonni CINDERELLA Poli, MD Referring diagnosis?  Diagnosis  Z96.652 (ICD-10-CM) - Status post total left knee replacement   Treatment diagnosis? (if different than referring diagnosis) R26.2   R60.0   M62.81   M25.662   M25.562  What was this (referring dx) caused by? Surgery (Type: TKA) and Arthritis  Nature of Condition: Initial Onset (within last 3 months)   Laterality: Lt  Current Functional Measure Score: Patient Specific Functional Scale 3.5  Objective measurements identify impairments when they are compared to normal values, the uninvolved extremity, and prior level of function.  [x]  Yes  []  No  Objective assessment of functional ability: Severe functional limitations   Briefly describe symptoms: Walking with a cane, poor sleep, poor function post total knee replacement  How did symptoms start: Osteoarthritis  Average pain intensity:  Last 24 hours: 0-5/10  Past week: 0-5/10  How often does the pt experience symptoms? Constantly  How much have the symptoms interfered with usual daily  activities? Quite a bit  How has condition changed since care began at this facility? NA - initial visit  In general, how is the patients overall health? Good   BACK PAIN (STarT Back Screening Tool) No

## 2023-11-27 ENCOUNTER — Other Ambulatory Visit: Payer: Self-pay | Admitting: Family Medicine

## 2023-11-29 ENCOUNTER — Other Ambulatory Visit: Payer: Self-pay | Admitting: *Deleted

## 2023-11-29 ENCOUNTER — Ambulatory Visit: Payer: Self-pay

## 2023-11-29 ENCOUNTER — Encounter: Payer: Self-pay | Admitting: Rehabilitative and Restorative Service Providers"

## 2023-11-29 ENCOUNTER — Telehealth: Payer: Self-pay | Admitting: *Deleted

## 2023-11-29 ENCOUNTER — Ambulatory Visit: Admitting: Rehabilitative and Restorative Service Providers"

## 2023-11-29 DIAGNOSIS — R262 Difficulty in walking, not elsewhere classified: Secondary | ICD-10-CM | POA: Diagnosis not present

## 2023-11-29 DIAGNOSIS — M25662 Stiffness of left knee, not elsewhere classified: Secondary | ICD-10-CM | POA: Diagnosis not present

## 2023-11-29 DIAGNOSIS — M25562 Pain in left knee: Secondary | ICD-10-CM | POA: Diagnosis not present

## 2023-11-29 DIAGNOSIS — R600 Localized edema: Secondary | ICD-10-CM | POA: Diagnosis not present

## 2023-11-29 DIAGNOSIS — M6281 Muscle weakness (generalized): Secondary | ICD-10-CM

## 2023-11-29 DIAGNOSIS — L309 Dermatitis, unspecified: Secondary | ICD-10-CM

## 2023-11-29 NOTE — Telephone Encounter (Signed)
 Copied from CRM [id number]. Topic: Referral - Request for Referral >> Nov 29, 2023  1:11 PM Drema MATSU wrote: Did the patient discuss referral with their provider in the last year? no (If No - schedule appointment) (If Yes - send message)  Appointment offered? Yes  Type of order/referral and detailed reason for visit: referral for rash   Preference of office, provider, location: n/a dermatology in [place] Dr. Estefana Dylamham [facility]  If referral order, have you been seen by this specialty before? No (If Yes, this issue or another issue? When? Where?  Can we respond through MyChart? No

## 2023-11-29 NOTE — Telephone Encounter (Signed)
 Dermatology Referral placed.

## 2023-11-29 NOTE — Telephone Encounter (Signed)
 Ok with me. Please place any necessary orders.

## 2023-11-29 NOTE — Telephone Encounter (Signed)
 FYI Only or Action Required?: Action required by provider: request for appointment.  Patient was last seen in primary care on 10/11/2023 by Kennyth Worth HERO, MD.  Called Nurse Triage reporting Rash.  Symptoms began several months ago.  Interventions attempted: OTC medications: benadryl .  Symptoms are: unchanged.  Triage Disposition: See Physician Within 24 Hours  Patient/caregiver understands and will follow disposition?: YesCopied from CRM [id number]. Topic: Clinical - Red Word Triage >> Nov 29, 2023 10:22 AM Aleatha BROCKS wrote: Red Word that prompted transfer to Nurse Triage:Patient been using the triamcinolone  ointment (KENALOG ) 0.5 % is not working and she has severely broken out and itching all over Reason for Disposition  SEVERE itching (i.e., interferes with sleep, normal activities or school)  Answer Assessment - Initial Assessment Questions History-pt got into ' cleaning supplies and caused me to break out. This has been going on for several months. Pt has removed tiny pieces of  wire and plastic from these bumps randomly. Rash on left arm but itches whole body. Pt has tried OTC itching medication with no success.  Pt has physical therapy today and needed appt for tomorrow.    1. APPEARANCE of RASH: What does the rash look like? (e.g., blisters, dry flaky skin, red spots, redness, sores)     Red bumps 2. SIZE: How big are the spots? (e.g., tip of pen, eraser, coin; inches, centimeters)     tiny 3. LOCATION: Where is the rash located?     Left arm  4. COLOR: What color is the rash? (Note: It is difficult to assess rash color in people with darker-colored skin. When this situation occurs, simply ask the caller to describe what they see.)     Red/blister 5. ONSET: When did the rash begin?     Several months 6. FEVER: Do you have a fever? If Yes, ask: What is your temperature, how was it measured, and when did it start?     denies 7. ITCHING: Does the rash itch?  If Yes, ask: How bad is the itch? (Scale 1-10; or mild, moderate, severe)     severe 8. CAUSE: What do you think is causing the rash?     Not sure 9. MEDICINE FACTORS: Have you started any new medicines within the last 2 weeks? (e.g., antibiotics)      denies 10. OTHER SYMPTOMS: Do you have any other symptoms? (e.g., dizziness, headache, sore throat, joint pain)       denies  Protocols used: Rash or Redness - [facility]

## 2023-11-29 NOTE — Telephone Encounter (Signed)
 Patient has an appt with PCP on 11/30/2023

## 2023-11-29 NOTE — Therapy (Signed)
 OUTPATIENT PHYSICAL THERAPY TREATMENT/PROGRESS NOTE  Progress Note Reporting Period 10/27/2023 to 11/29/2023  See note below for Objective Data and Assessment of Progress/Goals.      Patient Name: [name] Good MRN: [id number] DOB:[birth year], 73 y.o., female Today's Date: 11/29/2023  END OF SESSION:  PT End of Session - 11/29/23 1347     Visit Number 10    Number of Visits 24    Date for PT Re-Evaluation 01/10/24    Authorization Type [employer] Medicare    Authorization Time Period [facility] MEDICARE $15 COPAY 16 VISITS APPROVED 7/2-8/27/25    Authorization - Number of Visits 16    Progress Note Due on Visit 10    PT Start Time 1346    PT Stop Time 1429    PT Time Calculation (min) 43 min    Equipment Utilized During Treatment Gait belt    Activity Tolerance Patient tolerated treatment well;Patient limited by fatigue;No increased pain    Behavior During Therapy WFL for tasks assessed/performed            Past Medical History:  Diagnosis Date   Anxiety    Arthritis    Depression    GERD (gastroesophageal reflux disease)    Myocardial infarction (HCC) 89   no visits to cardiac dr(thomas kelly) since 89   Stroke (HCC)    Wound dehiscence    lumbar   Past Surgical History:  Procedure Laterality Date   APPENDECTOMY     17 yrs   BACK SURGERY     BREAST SURGERY Left    cyst   BUBBLE STUDY  03/12/2020   Procedure: BUBBLE STUDY;  Surgeon: Claudene Pacific, MD;  Location: [facility];  Service: Cardiovascular;;   CARDIAC CATHETERIZATION  49   CHOLECYSTECTOMY     73 yrs old   ESOPHAGOGASTRODUODENOSCOPY N/A 10/05/2023   Procedure: EGD (ESOPHAGOGASTRODUODENOSCOPY);  Surgeon: San Sandor GAILS, DO;  Location: [facility];  Service: Gastroenterology;  Laterality: N/A;   HOT HEMOSTASIS N/A 10/05/2023   Procedure: EGD, WITH ARGON PLASMA COAGULATION;  Surgeon: San Sandor GAILS, DO;  Location: [facility];  Service: Gastroenterology;  Laterality: N/A;   LUMBAR LAMINECTOMY/DECOMPRESSION  MICRODISCECTOMY N/A 06/14/2015   Procedure: Lumbar Three-Four,Lumbar Four-Five, Lumbar Five-Sacral One Laminectomy;  Surgeon: Alm GORMAN Molt, MD;  Location: [facility];  Service: Neurosurgery;  Laterality: N/A;   LUMBAR WOUND DEBRIDEMENT N/A 07/24/2015   Procedure: lumbar wound revision;  Surgeon: Alm GORMAN Molt, MD;  Location: [facility];  Service: Neurosurgery;  Laterality: N/A;   TEE WITHOUT CARDIOVERSION N/A 03/12/2020   Procedure: TRANSESOPHAGEAL ECHOCARDIOGRAM (TEE);  Surgeon: Claudene Pacific, MD;  Location: [facility] ENDOSCOPY;  Service: Cardiovascular;  Laterality: N/A;   TOTAL HIP ARTHROPLASTY Right 10/25/2015   Procedure: RIGHT TOTAL HIP ARTHROPLASTY ANTERIOR APPROACH;  Surgeon: Lonni CINDERELLA Poli, MD;  Location: [facility];  Service: Orthopedics;  Laterality: Right;   TOTAL KNEE ARTHROPLASTY Left 08/31/2023   Procedure: ARTHROPLASTY, KNEE, TOTAL;  Surgeon: Poli Lonni CINDERELLA, MD;  Location: [facility] OR;  Service: Orthopedics;  Laterality: Left;   TUBAL LIGATION     Patient Active Problem List   Diagnosis Date Noted   Iron deficiency anemia 10/04/2023   Dermatitis 10/01/2023   GI bleed 10/01/2023   OA (osteoarthritis) of knee 08/31/2023   Status post total left knee replacement 08/31/2023   Senile purpura (HCC) 08/05/2023   Supratherapeutic INR 07/05/2023   Unilateral primary osteoarthritis, right knee 06/09/2023   Vitamin D  deficiency 02/16/2023   Dyslipidemia associated with type 2 diabetes  mellitus (HCC) 01/01/2023   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   History of CVA (cerebrovascular accident) 12/07/2022   T2DM (type 2 diabetes mellitus) (HCC) 08/11/2022   Chronic pain syndrome 10/30/2021   Morbid obesity (HCC) 09/02/2021   Depression, major, single episode, moderate (HCC) 07/15/2021   Vertebral artery stenosis 07/15/2021   Cerebrovascular disease 07/15/2021   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   RLS (restless legs syndrome) 10/02/2019   Arthritis 10/18/2017   Former  smoker 11/13/2016   Autoimmune disease (HCC) 08/12/2016   Obesity (BMI 35.0-39.9 without comorbidity) 08/12/2016   Status post total replacement of right hip 10/25/2015   S/P lumbar laminectomy 06/14/2015    PCP: Worth CHRISTELLA Kitty, MD  REFERRING PROVIDER: Lonni CINDERELLA Poli, MD  REFERRING DIAG:  Diagnosis  [phone] (ICD-10-CM) - Status post total left knee replacement    THERAPY DIAG:  Difficulty in walking, not elsewhere classified - Plan: PT plan of care cert/re-cert  Muscle weakness (generalized) - Plan: PT plan of care cert/re-cert  Localized edema - Plan: PT plan of care cert/re-cert  Stiffness of left knee, not elsewhere classified - Plan: PT plan of care cert/re-cert  Acute pain of left knee - Plan: PT plan of care cert/re-cert  Rationale for Evaluation and Treatment: Rehabilitation  ONSET DATE: Aug 31, 2023  SUBJECTIVE:   SUBJECTIVE STATEMENT:  June notes right > Left leg muscle soreness with increasing her activity level.  Pain meds are taken PRN, although she needs at least 2 a day.  Sleeping remains difficult due to her right leg itching has an appointment with a dermatologist to address this.  PERTINENT HISTORY: MI, stroke, back surgery (lumbar laminectomy with decompression and microdiscectomy), right THA, left TKA, former smoker and current vapor  PAIN:  Are you having pain? Yes: NPRS scale: 0-6/10 Left knee (with WB) Pain location: Left knee, sometimes Rt knee, Lt hip fatigue/weakness  Pain description: Achy, stiff, sore, sharp Aggravating factors: Sleeping, too much WB, prolonged postures Relieving factors: Ice, oxycodone  2 x a day  PRECAUTIONS: Back  RED FLAGS: None   WEIGHT BEARING RESTRICTIONS: No  FALLS:  Has patient fallen in last 6 months? No  LIVING ENVIRONMENT: Lives with: lives with their family and lives with their spouse Lives in: Mobile home Stairs: Needs handrail Has following equipment at home: Single point cane, Environmental consultant - 2  wheeled, and Tour manager  OCCUPATION: Retired  PLOF: Independent with basic ADLs  PATIENT GOALS: Return to painting birdhouses  NEXT MD VISIT: October 16 at 1:30  OBJECTIVE:  Note: Objective measures were completed at Evaluation unless otherwise noted.  DIAGNOSTIC FINDINGS: Interval total left knee arthroplasty without evidence of hardware failure.  PATIENT SURVEYS:   Patient-Specific Activity Scoring Scheme  0 represents "unable to perform." 10 represents "able to perform at prior level. 0 1 2 3 4 5 6 7 8 9  10 (Date and Score)   Activity Eval  10/27/2023 11/26/2023   1.  Sit to standing transfers   3  7.5   2. Walking independently  4  5  3.     4.    5.    Score 3.5 6.25   Total score = sum of the activity scores/number of activities Minimum detectable change (90%CI) for average score = 2 points Minimum detectable change (90%CI) for single activity score = 3 points  COGNITION: 10/27/2023 Overall cognitive status: Within functional limits for tasks assessed     SENSATION: .10/27/2023 Occasionally, not now.  Yona does note dizziness though.  EDEMA:  10/27/2023 Noted and not objectively assessed   LOWER EXTREMITY ROM:  Active ROM Left/Right  10/27/2023 Left  11/03/23 Left  11/12/23  supine Left 11/26/2023   Hip flexion      Hip extension      Hip abduction      Hip adduction      Hip internal rotation      Hip external rotation      Knee flexion 103/126 105 112* 110  Knee extension -10/-5 -7 -9* -4  Ankle dorsiflexion      Ankle plantarflexion      Ankle inversion      Ankle eversion       (Blank rows = not tested)  LOWER EXTREMITY MMT:  MMT Right  10/27/2023 Left 10/27/2023 Left/Right 11/26/2023  Hip flexion     Hip extension     Hip abduction     Hip adduction     Hip internal rotation     Hip external rotation     Knee flexion     Knee extension 4- 2+ 50.5/53.2  Ankle dorsiflexion     Ankle plantarflexion     Ankle inversion     Ankle eversion       (Blank rows = not tested)  GAIT: 11/24/2023: FWW into clinic.  Trial of SPC in Lt hand to help Rt leg improved steadiness in gait despite Lt knee surgery.  This is due to past troubles with Rt leg.   Eval:  Distance walked: 200 feet Assistive device utilized: Environmental consultant - 2 wheeled Level of assistance: Complete Independence Comments: Kirah notes she has been using an assistive device for years due to bilateral knee pain                      TREATMENT        DATE:   11/29/2023 Yoga Bridge 10 x 5 seconds Side-lie clams 10 x with Yellow Thera-Band   Functional Activities: Hip hike in parallel bars 20 x (for gait with cane) One sided hip hike in parallel bars 10 x 3 seconds each side  Neuromuscular re-education: Dynamic balance with cane (long strides and sequence left hand/cane with right foot) Eyes closed/feet together 3 x 20 seconds Tandem balance (1 gap heel to toe) 6 x 20 seconds   11/26/2023 Recumbent bike Seat 4 for 5 minutes Level 3 Quad sets 2 sets of 10 for 5 seconds Side-lie clams (lie left, lift right) 2 sets of 10 for 3 seconds Knee flexion AAROM 10 x 10 seconds  Neuromuscular re-education: Tandem balance 8 x 20 seconds Dynamic balance with cane (long strides, head up)   11/24/2023 Therex: Recumbent bike Lvl 2-3 8 mins  Seated Lt leg LAQ with end range pauses each direction 2.5 lbs 2 x 10  Standing alternating DF/PF with light hand assist on bar x 10 each way with cues throughout for techniques.   TherActivity: CGA with ambulation c SPC in Lt UE with ramp and curb up/down.  Moderate Assist required in lowering with WB on Lt leg for step down 6 inch.  Sit to stand to sit 18 inch chair with foam pad no UE assist x 5   Gait Training Trial of SPC in Rt hand showed difficulty in Rt leg WB.  Best ambulation strategy showed with use with SPC in Lt hand to offload Rt.   Cues for cane placement given.  Performance of household distances 100 ft x 2.  Discussion about  importance of walker /SPC use vs. Furniture cruising at home to limit fall risk.     PATIENT EDUCATION:  Eval  Education details: See above Person educated: Patient and Child(ren) Education method: Explanation, Demonstration, Tactile cues, Verbal cues, and Handouts Education comprehension: verbalized understanding, returned demonstration, verbal cues required, tactile cues required, and needs further education  HOME EXERCISE PROGRAM: Access Code: [email or web address] URL: [email or web address] Date: 11/26/2023 Prepared by: Lamar Ivory  Exercises - Supine Quadricep Sets  - 3 x daily - 7 x weekly - 2 sets - 10 reps - 5 second hold - Seated Knee Flexion AAROM  - 3 x daily - 7 x weekly - 1 sets - 5 reps - 10 seconds hold - Tandem Stance  - 1 x daily - 7 x weekly - 1 sets - 5 reps - 20 second hold - Standing Hip Hiking  - 2 x daily - 7 x weekly - 2 sets - 10 reps - 3 seconds hold - Clamshell  - 3 x daily - 7 x weekly - 2 sets - 10 reps - 3 seconds hold  ASSESSMENT:  CLINICAL IMPRESSION:  June continues to look better with her balance and strength activities and this is starting to be evident with her weightbearing function.  Right hip abductors, bilateral quadriceps and general lower extremity strength deficits are being addressed with her current program and will benefit from continued attention.  Gait, balance and endurance activities should also help June meet long-term goals that were established at evaluation and we continue to address.  OBJECTIVE IMPAIRMENTS: Abnormal gait, cardiopulmonary status limiting activity, decreased activity tolerance, decreased balance, decreased endurance, decreased knowledge of condition, difficulty walking, decreased ROM, decreased strength, increased edema, impaired perceived functional ability, obesity, and pain.   ACTIVITY LIMITATIONS: carrying, lifting, bending, standing, squatting, sleeping, stairs, transfers, bed mobility, and locomotion  level  PARTICIPATION LIMITATIONS: meal prep, cleaning, shopping, and community activity  PERSONAL FACTORS: MI, stroke, back surgery (lumbar laminectomy with decompression and microdiscectomy), right THA, left TKA, former smoker and current vapor are also affecting patient's functional outcome.   REHAB POTENTIAL: Good  CLINICAL DECISION MAKING: Stable/uncomplicated  EVALUATION COMPLEXITY: Low   GOALS: Goals reviewed with patient? Yes  SHORT TERM GOALS: Target date: 11/24/2023 Bunnie will be independent with her day 1 home exercise program Baseline: Started 10/27/2023 Goal status:   Met 11/19/2023  2.  Improve left knee active range of motion to 0 - 5 -110 degrees Baseline: 0 - 10 - 103 degrees Goal status: Met 11/26/2023   LONG TERM GOALS: Target date: 01/10/2024  Improve patient specific functional score to 5.5 Baseline: 3.5 Goal status: Met 11/26/2023  2.  Tiahna will report left knee pain consistently 0-3/10 on the visual analog scale Baseline: 0-5/10 Goal status: Ongoing 11/29/2023  3.  Improve left knee active range of motion to 0 - 3 - 115 degrees Baseline: 0 - 10 - 103 degrees Goal status: Ongoing  11/26/2023  4.  Improve bilateral knee strength for quadriceps to at least 4/5 MMT Baseline: See objective Goal status: Met 11/26/2023  5.  Kamorah will be independent and compliant with her long-term home maintenance exercise program at discharge Baseline: Started 10/27/2023 Goal status: Ongoing  11/29/2023   PLAN:  PT FREQUENCY: 2 x a week  PT DURATION: 6 weeks  PLANNED INTERVENTIONS: 97750- Physical Performance Testing, 97110-Therapeutic exercises, 97530- Therapeutic activity, 97112- Neuromuscular re-education, 97535- Self Care, [id number]- Manual therapy, [id number]- Gait training, [phone]- Electrical stimulation (unattended), [id number]- Vasopneumatic device, Patient/Family education, Balance  training, Stair training, Joint mobilization, and Cryotherapy  PLAN FOR NEXT SESSION:   SPC in Lt  hand to help improve Rt leg stability as Rt abduction weakness is most limiting.   Myer LELON Ivory PT, MPT 11/29/23  3:29 PM     Date of referral: 10/13/2023 Referring provider: Lonni CINDERELLA Poli, MD Referring diagnosis?  Diagnosis  Z96.652 (ICD-10-CM) - Status post total left knee replacement   Treatment diagnosis? (if different than referring diagnosis) R26.2   R60.0   M62.81   M25.662   M25.562  What was this (referring dx) caused by? Surgery (Type: TKA) and Arthritis  Nature of Condition: Initial Onset (within last 3 months)   Laterality: Lt  Current Functional Measure Score: Patient Specific Functional Scale 3.5  Objective measurements identify impairments when they are compared to normal values, the uninvolved extremity, and prior level of function.  [x]  Yes  []  No  Objective assessment of functional ability: Severe functional limitations   Briefly describe symptoms: Walking with a cane, poor sleep, poor function post total knee replacement  How did symptoms start: Osteoarthritis  Average pain intensity:  Last 24 hours: 0-5/10  Past week: 0-5/10  How often does the pt experience symptoms? Constantly  How much have the symptoms interfered with usual daily activities? Quite a bit  How has condition changed since care began at this facility?  Better standing and walking endurance, less instability and fear of falls  In general, how is the patients overall health? Good   BACK PAIN (STarT Back Screening Tool) No

## 2023-11-30 ENCOUNTER — Ambulatory Visit: Admitting: Family Medicine

## 2023-12-01 ENCOUNTER — Ambulatory Visit: Admitting: Rehabilitative and Restorative Service Providers"

## 2023-12-01 ENCOUNTER — Encounter: Payer: Self-pay | Admitting: Rehabilitative and Restorative Service Providers"

## 2023-12-01 DIAGNOSIS — M25562 Pain in left knee: Secondary | ICD-10-CM

## 2023-12-01 DIAGNOSIS — R600 Localized edema: Secondary | ICD-10-CM

## 2023-12-01 DIAGNOSIS — R262 Difficulty in walking, not elsewhere classified: Secondary | ICD-10-CM | POA: Diagnosis not present

## 2023-12-01 DIAGNOSIS — M25662 Stiffness of left knee, not elsewhere classified: Secondary | ICD-10-CM | POA: Diagnosis not present

## 2023-12-01 DIAGNOSIS — M6281 Muscle weakness (generalized): Secondary | ICD-10-CM | POA: Diagnosis not present

## 2023-12-01 NOTE — Therapy (Signed)
 OUTPATIENT PHYSICAL THERAPY TREATMENT NOTE   Patient Name: [name] Good MRN: [id number] DOB:[birth year], 73 y.o., female Today's Date: 12/01/2023  END OF SESSION:  PT End of Session - 12/01/23 1357     Visit Number 11    Number of Visits 24    Date for PT Re-Evaluation 01/10/24    Authorization Type [employer] Medicare    Authorization Time Period [facility] MEDICARE $15 COPAY 16 VISITS APPROVED 7/2-8/27/25    Authorization - Number of Visits 16    Progress Note Due on Visit 10    PT Start Time 1357    PT Stop Time 1444    PT Time Calculation (min) 47 min    Equipment Utilized During Treatment Gait belt    Activity Tolerance Patient tolerated treatment well;Patient limited by fatigue;No increased pain    Behavior During Therapy WFL for tasks assessed/performed             Past Medical History:  Diagnosis Date   Anxiety    Arthritis    Depression    GERD (gastroesophageal reflux disease)    Myocardial infarction (HCC) 89   no visits to cardiac dr(thomas kelly) since 89   Stroke (HCC)    Wound dehiscence    lumbar   Past Surgical History:  Procedure Laterality Date   APPENDECTOMY     17 yrs   BACK SURGERY     BREAST SURGERY Left    cyst   BUBBLE STUDY  03/12/2020   Procedure: BUBBLE STUDY;  Surgeon: Claudene Pacific, MD;  Location: [facility];  Service: Cardiovascular;;   CARDIAC CATHETERIZATION  36   CHOLECYSTECTOMY     73 yrs old   ESOPHAGOGASTRODUODENOSCOPY N/A 10/05/2023   Procedure: EGD (ESOPHAGOGASTRODUODENOSCOPY);  Surgeon: San Sandor GAILS, DO;  Location: [facility];  Service: Gastroenterology;  Laterality: N/A;   HOT HEMOSTASIS N/A 10/05/2023   Procedure: EGD, WITH ARGON PLASMA COAGULATION;  Surgeon: San Sandor GAILS, DO;  Location: [facility];  Service: Gastroenterology;  Laterality: N/A;   LUMBAR LAMINECTOMY/DECOMPRESSION MICRODISCECTOMY N/A 06/14/2015   Procedure: Lumbar Three-Four,Lumbar Four-Five, Lumbar Five-Sacral One Laminectomy;  Surgeon: Alm GORMAN Molt, MD;  Location: [facility];  Service: Neurosurgery;  Laterality: N/A;   LUMBAR WOUND DEBRIDEMENT N/A 07/24/2015   Procedure: lumbar wound revision;  Surgeon: Alm GORMAN Molt, MD;  Location: [facility];  Service: Neurosurgery;  Laterality: N/A;   TEE WITHOUT CARDIOVERSION N/A 03/12/2020   Procedure: TRANSESOPHAGEAL ECHOCARDIOGRAM (TEE);  Surgeon: Claudene Pacific, MD;  Location: [facility];  Service: Cardiovascular;  Laterality: N/A;   TOTAL HIP ARTHROPLASTY Right 10/25/2015   Procedure: RIGHT TOTAL HIP ARTHROPLASTY ANTERIOR APPROACH;  Surgeon: Lonni CINDERELLA Poli, MD;  Location: [facility];  Service: Orthopedics;  Laterality: Right;   TOTAL KNEE ARTHROPLASTY Left 08/31/2023   Procedure: ARTHROPLASTY, KNEE, TOTAL;  Surgeon: Poli Lonni CINDERELLA, MD;  Location: [facility] OR;  Service: Orthopedics;  Laterality: Left;   TUBAL LIGATION     Patient Active Problem List   Diagnosis Date Noted   Iron deficiency anemia 10/04/2023   Dermatitis 10/01/2023   GI bleed 10/01/2023   OA (osteoarthritis) of knee 08/31/2023   Status post total left knee replacement 08/31/2023   Senile purpura (HCC) 08/05/2023   Supratherapeutic INR 07/05/2023   Unilateral primary osteoarthritis, right knee 06/09/2023   Vitamin D  deficiency 02/16/2023   Dyslipidemia associated with type 2 diabetes mellitus (HCC) 01/01/2023   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   History of CVA (cerebrovascular accident) 12/07/2022   T2DM (type  2 diabetes mellitus) (HCC) 08/11/2022   Chronic pain syndrome 10/30/2021   Morbid obesity (HCC) 09/02/2021   Depression, major, single episode, moderate (HCC) 07/15/2021   Vertebral artery stenosis 07/15/2021   Cerebrovascular disease 07/15/2021   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   RLS (restless legs syndrome) 10/02/2019   Arthritis 10/18/2017   Former smoker 11/13/2016   Autoimmune disease (HCC) 08/12/2016   Obesity (BMI 35.0-39.9 without comorbidity) 08/12/2016   Status post  total replacement of right hip 10/25/2015   S/P lumbar laminectomy 06/14/2015    PCP: Worth CHRISTELLA Kitty, MD  REFERRING PROVIDER: Lonni CINDERELLA Poli, MD  REFERRING DIAG:  Diagnosis  [phone] (ICD-10-CM) - Status post total left knee replacement    THERAPY DIAG:  Difficulty in walking, not elsewhere classified  Muscle weakness (generalized)  Localized edema  Stiffness of left knee, not elsewhere classified  Acute pain of left knee  Rationale for Evaluation and Treatment: Rehabilitation  ONSET DATE: Aug 31, 2023  SUBJECTIVE:   SUBJECTIVE STATEMENT:  [name] Good notes some progress with her endurance and comfort with her cane.  Endurance and strength are still a concern and are being addressed with her home and clinic program.  PERTINENT HISTORY: MI, stroke, back surgery (lumbar laminectomy with decompression and microdiscectomy), right THA, left TKA, former smoker and current vapor  PAIN:  Are you having pain? Yes: NPRS scale: 0-6/10 Left knee (with WB) Pain location: Left knee, sometimes Rt knee, Lt hip fatigue/weakness  Pain description: Achy, stiff, sore, sharp Aggravating factors: Sleeping, too much WB, prolonged postures Relieving factors: Ice, oxycodone  2 x a day  PRECAUTIONS: Back  RED FLAGS: None   WEIGHT BEARING RESTRICTIONS: No  FALLS:  Has patient fallen in last 6 months? No  LIVING ENVIRONMENT: Lives with: lives with their family and lives with their spouse Lives in: Mobile home Stairs: Needs handrail Has following equipment at home: Single point cane, Environmental consultant - 2 wheeled, and Tour manager  OCCUPATION: Retired  PLOF: Independent with basic ADLs  PATIENT GOALS: Return to painting birdhouses  NEXT MD VISIT: October 16 at 1:30  OBJECTIVE:  Note: Objective measures were completed at Evaluation unless otherwise noted.  DIAGNOSTIC FINDINGS: Interval total left knee arthroplasty without evidence of hardware failure.  PATIENT SURVEYS:    Patient-Specific Activity Scoring Scheme  0 represents "unable to perform." 10 represents "able to perform at prior level. 0 1 2 3 4 5 6 7 8 9  10 (Date and Score)   Activity Eval  10/27/2023 11/26/2023   1.  Sit to standing transfers   3  7.5   2. Walking independently  4  5  3.     4.    5.    Score 3.5 6.25   Total score = sum of the activity scores/number of activities Minimum detectable change (90%CI) for average score = 2 points Minimum detectable change (90%CI) for single activity score = 3 points  COGNITION: 10/27/2023 Overall cognitive status: Within functional limits for tasks assessed     SENSATION: .10/27/2023 Occasionally, not now.  [name] Good does note dizziness though.  EDEMA:  10/27/2023 Noted and not objectively assessed   LOWER EXTREMITY ROM:  Active ROM Left/Right  10/27/2023 Left  11/03/23 Left  11/12/23  supine Left 11/26/2023   Hip flexion      Hip extension      Hip abduction      Hip adduction      Hip internal rotation      Hip external rotation  Knee flexion 103/126 105 112* 110  Knee extension -10/-5 -7 -9* -4  Ankle dorsiflexion      Ankle plantarflexion      Ankle inversion      Ankle eversion       (Blank rows = not tested)  LOWER EXTREMITY MMT:  MMT Right  10/27/2023 Left 10/27/2023 Left/Right 11/26/2023  Hip flexion     Hip extension     Hip abduction     Hip adduction     Hip internal rotation     Hip external rotation     Knee flexion     Knee extension 4- 2+ 50.5/53.2  Ankle dorsiflexion     Ankle plantarflexion     Ankle inversion     Ankle eversion      (Blank rows = not tested)  GAIT: 11/24/2023: FWW into clinic.  Trial of SPC in Lt hand to help Rt leg improved steadiness in gait despite Lt knee surgery.  This is due to past troubles with Rt leg.   Eval:  Distance walked: 200 feet Assistive device utilized: Walker - 2 wheeled Level of assistance: Complete Independence Comments: [name] Good notes she has been using an  assistive device for years due to bilateral knee pain                      TREATMENT        DATE:   12/01/2023 Neuromuscular re-education: Feet together on foam: Eyes open; Head turning; eyes closed Tandem balance (1 gap heel to toe) 6 x 20 seconds Dynamic balance with cane (long strides and sequence left hand/cane with right foot)  Functional Activities: Tap-ups without hands on 4 inch step 4 x  Step-up and back off 6 inch step with left hand on parallel bars 20 x Mini-sit to stands in parallel bars 10 x slow eccentrics Hip hike in parallel bars 20 x (for gait with cane) One sided hip hike in parallel bars 10 x 3 seconds each side   11/29/2023 Yoga Bridge 10 x 5 seconds Side-lie clams 10 x with Yellow Thera-Band   Functional Activities: Hip hike in parallel bars 20 x (for gait with cane) One sided hip hike in parallel bars 10 x 3 seconds each side  Neuromuscular re-education: Dynamic balance with cane (long strides and sequence left hand/cane with right foot) Eyes closed/feet together 3 x 20 seconds Tandem balance (1 gap heel to toe) 6 x 20 seconds   11/26/2023 Recumbent bike Seat 4 for 5 minutes Level 3 Quad sets 2 sets of 10 for 5 seconds Side-lie clams (lie left, lift right) 2 sets of 10 for 3 seconds Knee flexion AAROM 10 x 10 seconds  Neuromuscular re-education: Tandem balance 8 x 20 seconds Dynamic balance with cane (long strides, head up)    PATIENT EDUCATION:  Eval  Education details: See above Person educated: Patient and Child(ren) Education method: Explanation, Demonstration, Tactile cues, Verbal cues, and Handouts Education comprehension: verbalized understanding, returned demonstration, verbal cues required, tactile cues required, and needs further education  HOME EXERCISE PROGRAM: Access Code: [email or web address] URL: [email or web address] Date: 11/26/2023 Prepared by: Lamar Ivory  Exercises - Supine Quadricep Sets  - 3 x daily - 7 x weekly -  2 sets - 10 reps - 5 second hold - Seated Knee Flexion AAROM  - 3 x daily - 7 x weekly - 1 sets - 5 reps - 10 seconds hold - Tandem Stance  - 1 x daily - 7  x weekly - 1 sets - 5 reps - 20 second hold - Standing Hip Hiking  - 2 x daily - 7 x weekly - 2 sets - 10 reps - 3 seconds hold - Clamshell  - 3 x daily - 7 x weekly - 2 sets - 10 reps - 3 seconds hold  ASSESSMENT:  CLINICAL IMPRESSION:  [name] Good continues to slowly but significantly progress with her balance and strength activities and this is being noted with her weightbearing function.  Right hip abductors, bilateral quadriceps and general lower extremity strength remain the focus of continued strength work.  Gait, balance and endurance activities should also help [name] Good meet long-term goals that were established at evaluation and we continue to address.  OBJECTIVE IMPAIRMENTS: Abnormal gait, cardiopulmonary status limiting activity, decreased activity tolerance, decreased balance, decreased endurance, decreased knowledge of condition, difficulty walking, decreased ROM, decreased strength, increased edema, impaired perceived functional ability, obesity, and pain.   ACTIVITY LIMITATIONS: carrying, lifting, bending, standing, squatting, sleeping, stairs, transfers, bed mobility, and locomotion level  PARTICIPATION LIMITATIONS: meal prep, cleaning, shopping, and community activity  PERSONAL FACTORS: MI, stroke, back surgery (lumbar laminectomy with decompression and microdiscectomy), right THA, left TKA, former smoker and current vapor are also affecting patient's functional outcome.   REHAB POTENTIAL: Good  CLINICAL DECISION MAKING: Stable/uncomplicated  EVALUATION COMPLEXITY: Low   GOALS: Goals reviewed with patient? Yes  SHORT TERM GOALS: Target date: 11/24/2023 [name] Good will be independent with her day 1 home exercise program Baseline: Started 10/27/2023 Goal status:   Met 11/19/2023  2.  Improve left knee active range of motion to 0 - 5  -110 degrees Baseline: 0 - 10 - 103 degrees Goal status: Met 11/26/2023   LONG TERM GOALS: Target date: 01/10/2024  Improve patient specific functional score to 5.5 Baseline: 3.5 Goal status: Met 11/26/2023  2.  [name] Good will report left knee pain consistently 0-3/10 on the visual analog scale Baseline: 0-5/10 Goal status: Ongoing 11/29/2023  3.  Improve left knee active range of motion to 0 - 3 - 115 degrees Baseline: 0 - 10 - 103 degrees Goal status: Ongoing  11/26/2023  4.  Improve bilateral knee strength for quadriceps to at least 4/5 MMT Baseline: See objective Goal status: Met 11/26/2023  5.  [name] Good will be independent and compliant with her long-term home maintenance exercise program at discharge Baseline: Started 10/27/2023 Goal status: Ongoing  11/29/2023   PLAN:  PT FREQUENCY: 2 x a week  PT DURATION: 6 weeks  PLANNED INTERVENTIONS: 97750- Physical Performance Testing, 97110-Therapeutic exercises, 97530- Therapeutic activity, 97112- Neuromuscular re-education, 97535- Self Care, 02859- Manual therapy, [phone]- Gait training, [phone]- Electrical stimulation (unattended), [id number]- Vasopneumatic device, Patient/Family education, Balance training, Stair training, Joint mobilization, and Cryotherapy  PLAN FOR NEXT SESSION:   SPC in Lt hand to help improve Rt leg stability as Rt abduction weakness is most limiting.  Left hip abductors and bilateral quadriceps strength work.  Gait, balance and functional drills for safe ambulation with a cane.   Myer LELON Ivory PT, MPT 12/01/23  2:48 PM     Date of referral: 10/13/2023 Referring provider: Lonni CINDERELLA Poli, MD Referring diagnosis?  Diagnosis  Z96.652 (ICD-10-CM) - Status post total left knee replacement   Treatment diagnosis? (if different than referring diagnosis) R26.2   R60.0   M62.81   M25.662   M25.562  What was this (referring dx) caused by? Surgery (Type: TKA) and Arthritis  Nature of Condition: Initial Onset (within last 3  months)  Laterality: Lt  Current Functional Measure Score: Patient Specific Functional Scale 3.5  Objective measurements identify impairments when they are compared to normal values, the uninvolved extremity, and prior level of function.  [x]  Yes  []  No  Objective assessment of functional ability: Severe functional limitations   Briefly describe symptoms: Walking with a cane, poor sleep, poor function post total knee replacement  How did symptoms start: Osteoarthritis  Average pain intensity:  Last 24 hours: 0-5/10  Past week: 0-5/10  How often does the pt experience symptoms? Constantly  How much have the symptoms interfered with usual daily activities? Quite a bit  How has condition changed since care began at this facility?  Better standing and walking endurance, less instability and fear of falls  In general, how is the patients overall health? Good   BACK PAIN (STarT Back Screening Tool) No

## 2023-12-02 ENCOUNTER — Telehealth: Payer: Self-pay | Admitting: *Deleted

## 2023-12-02 ENCOUNTER — Other Ambulatory Visit: Payer: Self-pay | Admitting: *Deleted

## 2023-12-02 DIAGNOSIS — L309 Dermatitis, unspecified: Secondary | ICD-10-CM

## 2023-12-02 NOTE — Telephone Encounter (Signed)
 Copied from CRM [id number]. Topic: Referral - Question >> Dec 02, 2023  8:51 AM Mercedes MATSU wrote: Reason for CRM: Patients daughter Sherrica called on behalf of the patient stating that the referral for teratology was sent to the wrong place. That is was supposed to be sent to [place] [facility] for Dr. Estefana. Patients daughter is requesting a call back and can be reached at [phone].   Referral placed  [facility]

## 2023-12-03 NOTE — Telephone Encounter (Signed)
 Good Morning Dr. Kennyth,  According to the CRM fro pt;s daughter she is requesting Dr. Cristobal. The only Estefana Burrs I found is a Engineer, petroleum. Dr. Estefana Fritter [facility] [address] [place], [place] [address]   The referral placed on 8/7 shows  [facility] [address], [place], [place] [address] Phone: [phone] IF the patient wants to go to [facility] they are scheduling out in March 2026 for appointments.   Please advise which location or provider is correct and if it's okay for her to wait until 2026 if she wants the [facility]  Thank you   Sherri

## 2023-12-06 NOTE — Telephone Encounter (Signed)
 Spoke with patient, want to go to be referred to [facility]  no specific provider

## 2023-12-06 NOTE — Telephone Encounter (Signed)
 Please clarify with patient/daughter about what they would prefer.  Worth HERO. Kennyth, MD 12/06/2023 9:30 AM

## 2023-12-07 DIAGNOSIS — L259 Unspecified contact dermatitis, unspecified cause: Secondary | ICD-10-CM | POA: Diagnosis not present

## 2023-12-08 ENCOUNTER — Ambulatory Visit: Admitting: Rehabilitative and Restorative Service Providers"

## 2023-12-08 ENCOUNTER — Encounter: Payer: Self-pay | Admitting: Rehabilitative and Restorative Service Providers"

## 2023-12-08 DIAGNOSIS — R262 Difficulty in walking, not elsewhere classified: Secondary | ICD-10-CM | POA: Diagnosis not present

## 2023-12-08 DIAGNOSIS — M25662 Stiffness of left knee, not elsewhere classified: Secondary | ICD-10-CM | POA: Diagnosis not present

## 2023-12-08 DIAGNOSIS — R600 Localized edema: Secondary | ICD-10-CM

## 2023-12-08 DIAGNOSIS — M25562 Pain in left knee: Secondary | ICD-10-CM | POA: Diagnosis not present

## 2023-12-08 DIAGNOSIS — M6281 Muscle weakness (generalized): Secondary | ICD-10-CM | POA: Diagnosis not present

## 2023-12-08 NOTE — Therapy (Signed)
 OUTPATIENT PHYSICAL THERAPY TREATMENT NOTE   Patient Name: [name] Good MRN: [id number] DOB:Mar 05, 1951, 73 y.o., female Today's Date: 12/08/2023  END OF SESSION:  PT End of Session - 12/08/23 1349     Visit Number 12    Number of Visits 24    Date for PT Re-Evaluation 01/10/24    Authorization Type [employer] Medicare    Authorization Time Period [facility] MEDICARE $15 COPAY 16 VISITS APPROVED 7/2-8/27/25    Authorization - Number of Visits 16    Progress Note Due on Visit 10    PT Start Time 1347    PT Stop Time 1427    PT Time Calculation (min) 40 min    Equipment Utilized During Treatment Gait belt    Activity Tolerance Patient tolerated treatment well;Patient limited by fatigue;No increased pain    Behavior During Therapy WFL for tasks assessed/performed              Past Medical History:  Diagnosis Date   Anxiety    Arthritis    Depression    GERD (gastroesophageal reflux disease)    Myocardial infarction (HCC) 89   no visits to cardiac dr(thomas kelly) since 89   Stroke (HCC)    Wound dehiscence    lumbar   Past Surgical History:  Procedure Laterality Date   APPENDECTOMY     17 yrs   BACK SURGERY     BREAST SURGERY Left    cyst   BUBBLE STUDY  03/12/2020   Procedure: BUBBLE STUDY;  Surgeon: Claudene Pacific, MD;  Location: [facility];  Service: Cardiovascular;;   CARDIAC CATHETERIZATION  73   CHOLECYSTECTOMY     73 yrs old   ESOPHAGOGASTRODUODENOSCOPY N/A 10/05/2023   Procedure: EGD (ESOPHAGOGASTRODUODENOSCOPY);  Surgeon: San Sandor GAILS, DO;  Location: [facility];  Service: Gastroenterology;  Laterality: N/A;   HOT HEMOSTASIS N/A 10/05/2023   Procedure: EGD, WITH ARGON PLASMA COAGULATION;  Surgeon: [place] Sandor GAILS, DO;  Location: [facility];  Service: Gastroenterology;  Laterality: N/A;   LUMBAR LAMINECTOMY/DECOMPRESSION MICRODISCECTOMY N/A 06/14/2015   Procedure: Lumbar Three-Four,Lumbar Four-Five, Lumbar Five-Sacral One Laminectomy;  Surgeon: Alm GORMAN Molt, MD;  Location: [facility];  Service: Neurosurgery;  Laterality: N/A;   LUMBAR WOUND DEBRIDEMENT N/A 07/24/2015   Procedure: lumbar wound revision;  Surgeon: Alm GORMAN Molt, MD;  Location: [facility];  Service: Neurosurgery;  Laterality: N/A;   TEE WITHOUT CARDIOVERSION N/A 03/12/2020   Procedure: TRANSESOPHAGEAL ECHOCARDIOGRAM (TEE);  Surgeon: Claudene Pacific, MD;  Location: [facility];  Service: Cardiovascular;  Laterality: N/A;   TOTAL HIP ARTHROPLASTY Right 10/25/2015   Procedure: RIGHT TOTAL HIP ARTHROPLASTY ANTERIOR APPROACH;  Surgeon: Lonni CINDERELLA Poli, MD;  Location: [facility];  Service: Orthopedics;  Laterality: Right;   TOTAL KNEE ARTHROPLASTY Left 08/31/2023   Procedure: ARTHROPLASTY, KNEE, TOTAL;  Surgeon: Poli Lonni CINDERELLA, MD;  Location: [facility] OR;  Service: Orthopedics;  Laterality: Left;   TUBAL LIGATION     Patient Active Problem List   Diagnosis Date Noted   Iron deficiency anemia 10/04/2023   Dermatitis 10/01/2023   GI bleed 10/01/2023   OA (osteoarthritis) of knee 08/31/2023   Status post total left knee replacement 08/31/2023   Senile purpura (HCC) 08/05/2023   Supratherapeutic INR 07/05/2023   Unilateral primary osteoarthritis, right knee 06/09/2023   Vitamin D  deficiency 02/16/2023   Dyslipidemia associated with type 2 diabetes mellitus (HCC) 01/01/2023   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   History of CVA (cerebrovascular accident) 12/07/2022   T2DM (  type 2 diabetes mellitus) (HCC) 08/11/2022   Chronic pain syndrome 10/30/2021   Morbid obesity (HCC) 09/02/2021   Depression, major, single episode, moderate (HCC) 07/15/2021   Vertebral artery stenosis 07/15/2021   Cerebrovascular disease 07/15/2021   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   RLS (restless legs syndrome) 10/02/2019   Arthritis 10/18/2017   Former smoker 11/13/2016   Autoimmune disease (HCC) 08/12/2016   Obesity (BMI 35.0-39.9 without comorbidity) 08/12/2016   Status post  total replacement of right hip 10/25/2015   S/P lumbar laminectomy 06/14/2015    PCP: Worth CHRISTELLA Kitty, MD  REFERRING PROVIDER: Lonni CINDERELLA Poli, MD  REFERRING DIAG:  Diagnosis  [phone] (ICD-10-CM) - Status post total left knee replacement    THERAPY DIAG:  Difficulty in walking, not elsewhere classified  Muscle weakness (generalized)  Localized edema  Stiffness of left knee, not elsewhere classified  Acute pain of left knee  Rationale for Evaluation and Treatment: Rehabilitation  ONSET DATE: Aug 31, 2023  SUBJECTIVE:   SUBJECTIVE STATEMENT:  June notes she has been using the cane almost exclusively at home (was using her cane and a walker interchangeably).  She did report a recent near fall, although she was able to maintain her balance by grabbing with her right hand, demonstrating need for continued balance and gait work.  PERTINENT HISTORY: MI, stroke, back surgery (lumbar laminectomy with decompression and microdiscectomy), right THA, left TKA, former smoker and current vapor  PAIN:  Are you having pain? Yes: NPRS scale: 0-6/10 right knee and hip (with WB) Pain location: Rt knee and hip Pain description: Achy, stiff, sore, can be sharp with weight-bearing Aggravating factors: Overuse with WB, maintaining prolonged postures Relieving factors: Taking a break from weight-bearing  PRECAUTIONS: Back  RED FLAGS: None   WEIGHT BEARING RESTRICTIONS: No  FALLS:  Has patient fallen in last 6 months? No  LIVING ENVIRONMENT: Lives with: lives with their family and lives with their spouse Lives in: Mobile home Stairs: Needs handrail Has following equipment at home: Single point cane, Environmental consultant - 2 wheeled, and Tour manager  OCCUPATION: Retired  PLOF: Independent with basic ADLs  PATIENT GOALS: Return to painting birdhouses  NEXT MD VISIT: October 16 at 1:30  OBJECTIVE:  Note: Objective measures were completed at Evaluation unless otherwise  noted.  DIAGNOSTIC FINDINGS: Interval total left knee arthroplasty without evidence of hardware failure.  PATIENT SURVEYS:   Patient-Specific Activity Scoring Scheme  0 represents "unable to perform." 10 represents "able to perform at prior level. 0 1 2 3 4 5 6 7 8 9  10 (Date and Score)   Activity Eval  10/27/2023 11/26/2023   1.  Sit to standing transfers   3  7.5   2. Walking independently  4  5  3.     4.    5.    Score 3.5 6.25   Total score = sum of the activity scores/number of activities Minimum detectable change (90%CI) for average score = 2 points Minimum detectable change (90%CI) for single activity score = 3 points  COGNITION: 10/27/2023 Overall cognitive status: Within functional limits for tasks assessed     SENSATION: .10/27/2023 Occasionally, not now.  [place] does note dizziness though.  EDEMA:  10/27/2023 Noted and not objectively assessed   LOWER EXTREMITY ROM:  Active ROM Left/Right  10/27/2023 Left  11/03/23 Left  11/12/23  supine Left 11/26/2023   Hip flexion      Hip extension      Hip abduction  Hip adduction      Hip internal rotation      Hip external rotation      Knee flexion 103/126 105 112* 110  Knee extension -10/-5 -7 -9* -4  Ankle dorsiflexion      Ankle plantarflexion      Ankle inversion      Ankle eversion       (Blank rows = not tested)  LOWER EXTREMITY MMT:  MMT Right  10/27/2023 Left 10/27/2023 Left/Right 11/26/2023  Hip flexion     Hip extension     Hip abduction     Hip adduction     Hip internal rotation     Hip external rotation     Knee flexion     Knee extension 4- 2+ 50.5/53.2  Ankle dorsiflexion     Ankle plantarflexion     Ankle inversion     Ankle eversion      (Blank rows = not tested)  GAIT: 11/24/2023: FWW into clinic.  Trial of SPC in Lt hand to help Rt leg improved steadiness in gait despite Lt knee surgery.  This is due to past troubles with Rt leg.   Eval:  Distance walked: 200 feet Assistive  device utilized: Environmental consultant - 2 wheeled Level of assistance: Complete Independence Comments: Aleksandra notes she has been using an assistive device for years due to bilateral knee pain                      TREATMENT        DATE:   12/08/2023 Neuromuscular re-education: Feet together on foam: Eyes open; Eyes closed 5 x 20 seconds Dynamic Tandem balance (< 1 gap heel to toe) 6 laps forwards and backwards Dynamic heel to toe raises 15 x (PT assist as needed for balance, cues for step strategy) Dynamic balance with cane (long strides and sequence left hand/cane with right foot)  Functional Activities: Mini-sit to stands in parallel bars 10 x slow eccentrics Hip hike in parallel bars 20 x (for gait with cane) One sided hip hike in parallel bars 10 x 3 seconds each side Double Leg Press 100# and 75# 10 x each full extension and slowly into flexion for control with stairs and stand to sit Single Leg Press 50# and 31# 10 X each full extension and slowly into flexion for control with stairs and stand to sit (needed PT assist with Rt leg only)   12/01/2023 Neuromuscular re-education: Feet together on foam: Eyes open; Head turning; eyes closed Tandem balance (1 gap heel to toe) 6 x 20 seconds Dynamic balance with cane (long strides and sequence left hand/cane with right foot)  Functional Activities: Tap-ups without hands on 4 inch step 4 x  Step-up and back off 6 inch step with left hand on parallel bars 20 x Mini-sit to stands in parallel bars 10 x slow eccentrics Hip hike in parallel bars 20 x (for gait with cane) One sided hip hike in parallel bars 10 x 3 seconds each side   11/29/2023 Yoga Bridge 10 x 5 seconds Side-lie clams 10 x with Yellow Thera-Band   Functional Activities: Hip hike in parallel bars 20 x (for gait with cane) One sided hip hike in parallel bars 10 x 3 seconds each side  Neuromuscular re-education: Dynamic balance with cane (long strides and sequence left hand/cane with  right foot) Eyes closed/feet together 3 x 20 seconds Tandem balance (1 gap heel to toe) 6 x 20 seconds  PATIENT EDUCATION:  Eval  Education details: See above Person educated: Patient and Child(ren) Education method: Explanation, Demonstration, Tactile cues, Verbal cues, and Handouts Education comprehension: verbalized understanding, returned demonstration, verbal cues required, tactile cues required, and needs further education  HOME EXERCISE PROGRAM: Access Code: [email or web address] URL: [email or web address] Date: 11/26/2023 Prepared by: Lamar Ivory  Exercises - Supine Quadricep Sets  - 3 x daily - 7 x weekly - 2 sets - 10 reps - 5 second hold - Seated Knee Flexion AAROM  - 3 x daily - 7 x weekly - 1 sets - 5 reps - 10 seconds hold - Tandem Stance  - 1 x daily - 7 x weekly - 1 sets - 5 reps - 20 second hold - Standing Hip Hiking  - 2 x daily - 7 x weekly - 2 sets - 10 reps - 3 seconds hold - Clamshell  - 3 x daily - 7 x weekly - 2 sets - 10 reps - 3 seconds hold  ASSESSMENT:  CLINICAL IMPRESSION:  June mentioned a near fall at home, although she was able to maintain her balance by reaching out with her right hand.  This demonstrates a need for continued balance and gait work.  June has been using her cane almost exclusively, although we need to make sure she is safe and independent with her cane before transfer into more independent rehabilitation.  Improving bilateral lower extremity strength, not only for the surgically involved right knee but for bilateral hips and quadriceps should allow June to meet long-term goals.  OBJECTIVE IMPAIRMENTS: Abnormal gait, cardiopulmonary status limiting activity, decreased activity tolerance, decreased balance, decreased endurance, decreased knowledge of condition, difficulty walking, decreased ROM, decreased strength, increased edema, impaired perceived functional ability, obesity, and pain.   ACTIVITY LIMITATIONS: carrying, lifting,  bending, standing, squatting, sleeping, stairs, transfers, bed mobility, and locomotion level  PARTICIPATION LIMITATIONS: meal prep, cleaning, shopping, and community activity  PERSONAL FACTORS: MI, stroke, back surgery (lumbar laminectomy with decompression and microdiscectomy), right THA, left TKA, former smoker and current vapor are also affecting patient's functional outcome.   REHAB POTENTIAL: Good  CLINICAL DECISION MAKING: Stable/uncomplicated  EVALUATION COMPLEXITY: Low   GOALS: Goals reviewed with patient? Yes  SHORT TERM GOALS: Target date: 11/24/2023 Lylah will be independent with her day 1 home exercise program Baseline: Started 10/27/2023 Goal status:   Met 11/19/2023  2.  Improve left knee active range of motion to 0 - 5 -110 degrees Baseline: 0 - 10 - 103 degrees Goal status: Met 11/26/2023   LONG TERM GOALS: Target date: 01/10/2024  Improve patient specific functional score to 5.5 Baseline: 3.5 Goal status: Met 11/26/2023  2.  Dewana will report left knee pain consistently 0-3/10 on the visual analog scale Baseline: 0-5/10 Goal status: Ongoing 12/08/2023  3.  Improve left knee active range of motion to 0 - 3 - 115 degrees Baseline: 0 - 10 - 103 degrees Goal status: Ongoing  11/26/2023  4.  Improve bilateral knee strength for quadriceps to at least 4/5 MMT Baseline: See objective Goal status: Met 11/26/2023  5.  Collene will be independent and compliant with her long-term home maintenance exercise program at discharge Baseline: Started 10/27/2023 Goal status: Ongoing  12/08/2023   PLAN:  PT FREQUENCY: 2 x a week  PT DURATION: Through 01/10/2024  PLANNED INTERVENTIONS: 97750- Physical Performance Testing, 97110-Therapeutic exercises, 97530- Therapeutic activity, 97112- Neuromuscular re-education, 97535- Self Care, [id number]- Manual therapy, [id number]- Gait training, [phone]- Electrical stimulation (unattended), [id number]- Vasopneumatic device, Patient/Family education,  Balance  training, Stair training, Joint mobilization, and Cryotherapy  PLAN FOR NEXT SESSION:   SPC in Lt hand to help improve Rt leg stability as Rt abduction weakness is most limiting.  Left > Right hip abductors and bilateral quadriceps strength work.  Gait, balance and functional drills for safe ambulation with a cane.   Myer LELON Ivory PT, MPT 12/08/23  4:26 PM     Date of referral: 10/13/2023 Referring provider: Lonni CINDERELLA Poli, MD Referring diagnosis?  Diagnosis  Z96.652 (ICD-10-CM) - Status post total left knee replacement   Treatment diagnosis? (if different than referring diagnosis) R26.2   R60.0   M62.81   M25.662   M25.562  What was this (referring dx) caused by? Surgery (Type: TKA) and Arthritis  Nature of Condition: Initial Onset (within last 3 months)   Laterality: Lt  Current Functional Measure Score: Patient Specific Functional Scale 3.5  Objective measurements identify impairments when they are compared to normal values, the uninvolved extremity, and prior level of function.  [x]  Yes  []  No  Objective assessment of functional ability: Severe functional limitations   Briefly describe symptoms: Walking with a cane, poor sleep, poor function post total knee replacement  How did symptoms start: Osteoarthritis  Average pain intensity:  Last 24 hours: 0-5/10  Past week: 0-5/10  How often does the pt experience symptoms? Constantly  How much have the symptoms interfered with usual daily activities? Quite a bit  How has condition changed since care began at this facility?  Better standing and walking endurance, less instability and fear of falls  In general, how is the patients overall health? Good   BACK PAIN (STarT Back Screening Tool) No

## 2023-12-10 ENCOUNTER — Encounter: Payer: Self-pay | Admitting: Rehabilitative and Restorative Service Providers"

## 2023-12-10 ENCOUNTER — Ambulatory Visit (INDEPENDENT_AMBULATORY_CARE_PROVIDER_SITE_OTHER): Admitting: Rehabilitative and Restorative Service Providers"

## 2023-12-10 DIAGNOSIS — M25662 Stiffness of left knee, not elsewhere classified: Secondary | ICD-10-CM

## 2023-12-10 DIAGNOSIS — R600 Localized edema: Secondary | ICD-10-CM

## 2023-12-10 DIAGNOSIS — M6281 Muscle weakness (generalized): Secondary | ICD-10-CM

## 2023-12-10 DIAGNOSIS — R262 Difficulty in walking, not elsewhere classified: Secondary | ICD-10-CM | POA: Diagnosis not present

## 2023-12-10 DIAGNOSIS — M25562 Pain in left knee: Secondary | ICD-10-CM

## 2023-12-10 NOTE — Therapy (Signed)
 OUTPATIENT PHYSICAL THERAPY TREATMENT NOTE   Patient Name: [name] Good MRN: [id number] DOB:Sep 26, 1950, 73 y.o., female Today's Date: 12/10/2023  END OF SESSION:  PT End of Session - 12/10/23 1350     Visit Number 13    Number of Visits 24    Date for PT Re-Evaluation 01/10/24    Authorization Type U[employer] Medicare    Authorization Time Period [facility] MEDICARE $15 COPAY 16 VISITS APPROVED 7/2-8/27/25    Authorization - Number of Visits 16    Progress Note Due on Visit 10    PT Start Time 1348    PT Stop Time 1430    PT Time Calculation (min) 42 min    Equipment Utilized During Treatment Gait belt    Activity Tolerance Patient tolerated treatment well;Patient limited by fatigue;No increased pain    Behavior During Therapy WFL for tasks assessed/performed               Past Medical History:  Diagnosis Date   Anxiety    Arthritis    Depression    GERD (gastroesophageal reflux disease)    Myocardial infarction (HCC) 89   no visits to cardiac dr(thomas kelly) since 92   Stroke (HCC)    Wound dehiscence    lumbar   Past Surgical History:  Procedure Laterality Date   APPENDECTOMY     17 yrs   BACK SURGERY     BREAST SURGERY Left    cyst   BUBBLE STUDY  03/12/2020   Procedure: BUBBLE STUDY;  Surgeon: Claudene Pacific, MD;  Location: [facility];  Service: Cardiovascular;;   CARDIAC CATHETERIZATION  60   CHOLECYSTECTOMY     73 yrs old   ESOPHAGOGASTRODUODENOSCOPY N/A 10/05/2023   Procedure: EGD (ESOPHAGOGASTRODUODENOSCOPY);  Surgeon: San Sandor GAILS, DO;  Location: [facility];  Service: Gastroenterology;  Laterality: N/A;   HOT HEMOSTASIS N/A 10/05/2023   Procedure: EGD, WITH ARGON PLASMA COAGULATION;  Surgeon: San Sandor GAILS, DO;  Location: [facility];  Service: Gastroenterology;  Laterality: N/A;   LUMBAR LAMINECTOMY/DECOMPRESSION MICRODISCECTOMY N/A 06/14/2015   Procedure: Lumbar Three-Four,Lumbar Four-Five, Lumbar Five-Sacral One Laminectomy;  Surgeon: Alm GORMAN Molt, MD;  Location: [facility];  Service: Neurosurgery;  Laterality: N/A;   LUMBAR WOUND DEBRIDEMENT N/A 07/24/2015   Procedure: lumbar wound revision;  Surgeon: Alm GORMAN Molt, MD;  Location: [facility];  Service: Neurosurgery;  Laterality: N/A;   TEE WITHOUT CARDIOVERSION N/A 03/12/2020   Procedure: TRANSESOPHAGEAL ECHOCARDIOGRAM (TEE);  Surgeon: Claudene Pacific, MD;  Location: [facility] ENDOSCOPY;  Service: Cardiovascular;  Laterality: N/A;   TOTAL HIP ARTHROPLASTY Right 10/25/2015   Procedure: RIGHT TOTAL HIP ARTHROPLASTY ANTERIOR APPROACH;  Surgeon: Lonni CINDERELLA Poli, MD;  Location: [facility];  Service: Orthopedics;  Laterality: Right;   TOTAL KNEE ARTHROPLASTY Left 08/31/2023   Procedure: ARTHROPLASTY, KNEE, TOTAL;  Surgeon: Poli Lonni CINDERELLA, MD;  Location: [facility];  Service: Orthopedics;  Laterality: Left;   TUBAL LIGATION     Patient Active Problem List   Diagnosis Date Noted   Iron deficiency anemia 10/04/2023   Dermatitis 10/01/2023   GI bleed 10/01/2023   OA (osteoarthritis) of knee 08/31/2023   Status post total left knee replacement 08/31/2023   Senile purpura (HCC) 08/05/2023   Supratherapeutic INR 07/05/2023   Unilateral primary osteoarthritis, right knee 06/09/2023   Vitamin D  deficiency 02/16/2023   Dyslipidemia associated with type 2 diabetes mellitus (HCC) 01/01/2023   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   History of CVA (cerebrovascular accident) 12/07/2022  T2DM (type 2 diabetes mellitus) (HCC) 08/11/2022   Chronic pain syndrome 10/30/2021   Morbid obesity (HCC) 09/02/2021   Depression, major, single episode, moderate (HCC) 07/15/2021   Vertebral artery stenosis 07/15/2021   Cerebrovascular disease 07/15/2021   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   RLS (restless legs syndrome) 10/02/2019   Arthritis 10/18/2017   Former smoker 11/13/2016   Autoimmune disease (HCC) 08/12/2016   Obesity (BMI 35.0-39.9 without comorbidity) 08/12/2016   Status post  total replacement of right hip 10/25/2015   S/P lumbar laminectomy 06/14/2015    PCP: Worth CHRISTELLA Kitty, MD  REFERRING PROVIDER: Lonni CINDERELLA Poli, MD  REFERRING DIAG:  Diagnosis  [phone] (ICD-10-CM) - Status post total left knee replacement    THERAPY DIAG:  Difficulty in walking, not elsewhere classified  Muscle weakness (generalized)  Localized edema  Stiffness of left knee, not elsewhere classified  Acute pain of left knee  Rationale for Evaluation and Treatment: Rehabilitation  ONSET DATE: Aug 31, 2023  SUBJECTIVE:   SUBJECTIVE STATEMENT:  June notes she continues to use the cane almost exclusively at home (was using her cane and a walker interchangeably).  Although no near falls since Wednesday, she will benefit from continued balance and gait work.  PERTINENT HISTORY: MI, stroke, back surgery (lumbar laminectomy with decompression and microdiscectomy), right THA, left TKA, former smoker and current vapor  PAIN:  Are you having pain? Yes: NPRS scale: 0-6/10 right knee and hip (with WB) Pain location: Rt knee and hip Pain description: Achy, stiff, sore, can be sharp with weight-bearing Aggravating factors: Overuse with WB, maintaining prolonged postures Relieving factors: Taking a break from weight-bearing  PRECAUTIONS: Back  RED FLAGS: None   WEIGHT BEARING RESTRICTIONS: No  FALLS:  Has patient fallen in last 6 months? No  LIVING ENVIRONMENT: Lives with: lives with their family and lives with their spouse Lives in: Mobile home Stairs: Needs handrail Has following equipment at home: Single point cane, Environmental consultant - 2 wheeled, and Tour manager  OCCUPATION: Retired  PLOF: Independent with basic ADLs  PATIENT GOALS: Return to painting birdhouses  NEXT MD VISIT: October 16 at 1:30  OBJECTIVE:  Note: Objective measures were completed at Evaluation unless otherwise noted.  DIAGNOSTIC FINDINGS: Interval total left knee arthroplasty without evidence of  hardware failure.  PATIENT SURVEYS:   Patient-Specific Activity Scoring Scheme  0 represents "unable to perform." 10 represents "able to perform at prior level. 0 1 2 3 4 5 6 7 8 9  10 (Date and Score)   Activity Eval  10/27/2023 11/26/2023   1.  Sit to standing transfers   3  7.5   2. Walking independently  4  5  3.     4.    5.    Score 3.5 6.25   Total score = sum of the activity scores/number of activities Minimum detectable change (90%CI) for average score = 2 points Minimum detectable change (90%CI) for single activity score = 3 points  COGNITION: 10/27/2023 Overall cognitive status: Within functional limits for tasks assessed     SENSATION: .10/27/2023 Occasionally, not now.  Otila does note dizziness though.  EDEMA:  10/27/2023 Noted and not objectively assessed   LOWER EXTREMITY ROM:  Active ROM Left/Right  10/27/2023 Left  11/03/23 Left  11/12/23  supine Left 11/26/2023  Left/Right 12/10/2023  Hip flexion       Hip extension       Hip abduction       Hip adduction  Hip internal rotation       Hip external rotation       Knee flexion 103/126 105 112* 110   Knee extension -10/-5 -7 -9* -4   Ankle dorsiflexion       Ankle plantarflexion       Ankle inversion       Ankle eversion        (Blank rows = not tested)  LOWER EXTREMITY MMT:  MMT Right  10/27/2023 Left 10/27/2023 Left/Right 11/26/2023 Left/Right 12/10/2023  Hip flexion      Hip extension      Hip abduction      Hip adduction      Hip internal rotation      Hip external rotation      Knee flexion      Knee extension 4- 2+ 50.5/53.2 50.5/41.0  Ankle dorsiflexion      Ankle plantarflexion      Ankle inversion      Ankle eversion       (Blank rows = not tested)  GAIT: 11/24/2023: FWW into clinic.  Trial of SPC in Lt hand to help Rt leg improved steadiness in gait despite Lt knee surgery.  This is due to past troubles with Rt leg.   Eval:  Distance walked: 200 feet Assistive device  utilized: Environmental consultant - 2 wheeled Level of assistance: Complete Independence Comments: Michel notes she has been using an assistive device for years due to bilateral knee pain                      TREATMENT        DATE:   12/10/2023 Seated straight leg raises 2 sets of 5  Quad sets 2 sets of 10 for 5 seconds  Neuromuscular re-education: Feet together on foam: Eyes open; Eyes closed 5 x 20 seconds Dynamic heel to toe raises 2 sets of 10 x (PT assist as needed for balance, cues for step strategy) 1 set with hands, 1 set no hands Dynamic balance with cane (long strides and sequence left hand/cane with right foot)  Functional Activities: Mini-sit to stands in parallel bars 5 x slow eccentrics Hip hike in parallel bars 20 x (for gait with cane) One sided hip hike in parallel bars 10 x 3 seconds each side   12/08/2023 Neuromuscular re-education: Feet together on foam: Eyes open; Eyes closed 5 x 20 seconds Dynamic Tandem balance (< 1 gap heel to toe) 6 laps forwards and backwards Dynamic heel to toe raises 15 x (PT assist as needed for balance, cues for step strategy) Dynamic balance with cane (long strides and sequence left hand/cane with right foot)  Functional Activities: Mini-sit to stands in parallel bars 10 x slow eccentrics Hip hike in parallel bars 20 x (for gait with cane) One sided hip hike in parallel bars 10 x 3 seconds each side Double Leg Press 100# and 75# 10 x each full extension and slowly into flexion for control with stairs and stand to sit Single Leg Press 50# and 31# 10 X each full extension and slowly into flexion for control with stairs and stand to sit (needed PT assist with Rt leg only)   12/01/2023 Neuromuscular re-education: Feet together on foam: Eyes open; Head turning; eyes closed Tandem balance (1 gap heel to toe) 6 x 20 seconds Dynamic balance with cane (long strides and sequence left hand/cane with right foot)  Functional Activities: Tap-ups without  hands on 4 inch step 4 x  Step-up and back off 6 inch step with left hand on parallel bars 20 x Mini-sit to stands in parallel bars 10 x slow eccentrics Hip hike in parallel bars 20 x (for gait with cane) One sided hip hike in parallel bars 10 x 3 seconds each side   PATIENT EDUCATION:  Eval  Education details: See above Person educated: Patient and Child(ren) Education method: Explanation, Demonstration, Tactile cues, Verbal cues, and Handouts Education comprehension: verbalized understanding, returned demonstration, verbal cues required, tactile cues required, and needs further education  HOME EXERCISE PROGRAM: Access Code: [email or web address] URL: [email or web address] Date: 12/10/2023 Prepared by: Lamar Ivory  Exercises - Supine Quadricep Sets  - 3 x daily - 7 x weekly - 2 sets - 10 reps - 5 second hold - Seated Knee Flexion AAROM  - 3 x daily - 7 x weekly - 1 sets - 5 reps - 10 seconds hold - Tandem Stance  - 1 x daily - 7 x weekly - 1 sets - 5 reps - 20 second hold - Standing Hip Hiking  - 2 x daily - 7 x weekly - 2 sets - 10 reps - 3 seconds hold - Clamshell  - 2 x daily - 7 x weekly - 2 sets - 10 reps - 3 seconds hold - Yoga Bridge  - 2 x daily - 7 x weekly - 1 sets - 10 reps - 5 seconds hold - Seated Active Straight-Leg Raise  - 3 x daily - 7 x weekly - 2 sets - 5 reps  ASSESSMENT:  CLINICAL IMPRESSION:  June mentioned her right knee has really been bothering her the last few days.  We spent a bit more time on quadriceps strengthening bilaterally as she is probably getting some overuse on the right side from compensating for her involved left side.  June also needs additional work to make sure she is safe and independent with her cane before transfer into more independent rehabilitation.  Improving bilateral lower extremity strength (quadriceps and hip abductors), not only for the surgically involved right knee but for bilateral hips should allow June to meet long-term  goals.  OBJECTIVE IMPAIRMENTS: Abnormal gait, cardiopulmonary status limiting activity, decreased activity tolerance, decreased balance, decreased endurance, decreased knowledge of condition, difficulty walking, decreased ROM, decreased strength, increased edema, impaired perceived functional ability, obesity, and pain.   ACTIVITY LIMITATIONS: carrying, lifting, bending, standing, squatting, sleeping, stairs, transfers, bed mobility, and locomotion level  PARTICIPATION LIMITATIONS: meal prep, cleaning, shopping, and community activity  PERSONAL FACTORS: MI, stroke, back surgery (lumbar laminectomy with decompression and microdiscectomy), right THA, left TKA, former smoker and current vapor are also affecting patient's functional outcome.   REHAB POTENTIAL: Good  CLINICAL DECISION MAKING: Stable/uncomplicated  EVALUATION COMPLEXITY: Low   GOALS: Goals reviewed with patient? Yes  SHORT TERM GOALS: Target date: 11/24/2023 Saher will be independent with her day 1 home exercise program Baseline: Started 10/27/2023 Goal status:   Met 11/19/2023  2.  Improve left knee active range of motion to 0 - 5 -110 degrees Baseline: 0 - 10 - 103 degrees Goal status: Met 11/26/2023   LONG TERM GOALS: Target date: 01/10/2024  Improve patient specific functional score to 5.5 Baseline: 3.5 Goal status: Met 11/26/2023  2.  Chayla will report left knee pain consistently 0-3/10 on the visual analog scale Baseline: 0-5/10 Goal status: Ongoing 12/08/2023  3.  Improve left knee active range of motion to 0 - 3 - 115 degrees Baseline: 0 - 10 - 103 degrees Goal  status: Ongoing  11/26/2023  4.  Improve bilateral knee strength for quadriceps to at least 4/5 MMT Baseline: See objective Goal status: Met 11/26/2023  5.  Bena will be independent and compliant with her long-term home maintenance exercise program at discharge Baseline: Started 10/27/2023 Goal status: Ongoing  12/08/2023   PLAN:  PT FREQUENCY: 2 x  a week  PT DURATION: Through 01/10/2024  PLANNED INTERVENTIONS: 97750- Physical Performance Testing, 97110-Therapeutic exercises, 97530- Therapeutic activity, 97112- Neuromuscular re-education, 97535- Self Care, 02859- Manual therapy, [id number]- Gait training, [phone]- Electrical stimulation (unattended), [id number]- Vasopneumatic device, Patient/Family education, Balance training, Stair training, Joint mobilization, and Cryotherapy  PLAN FOR NEXT SESSION:   SPC in Lt hand to help improve Rt leg stability as Rt abduction weakness is most limiting.  Left > Right hip abductors and bilateral quadriceps strength work.  Gait, balance and functional drills for safe ambulation with a cane.  Make sure she is getting appropriate quadriceps strength work bilaterally while not irritating her right knee.   Myer LELON Ivory PT, MPT 12/10/23  4:18 PM     Date of referral: 10/13/2023 Referring provider: Lonni CINDERELLA Poli, MD Referring diagnosis?  Diagnosis  Z96.652 (ICD-10-CM) - Status post total left knee replacement   Treatment diagnosis? (if different than referring diagnosis) R26.2   R60.0   M62.81   M25.662   M25.562  What was this (referring dx) caused by? Surgery (Type: TKA) and Arthritis  Nature of Condition: Initial Onset (within last 3 months)   Laterality: Lt  Current Functional Measure Score: Patient Specific Functional Scale 3.5  Objective measurements identify impairments when they are compared to normal values, the uninvolved extremity, and prior level of function.  [x]  Yes  []  No  Objective assessment of functional ability: Severe functional limitations   Briefly describe symptoms: Walking with a cane, poor sleep, poor function post total knee replacement  How did symptoms start: Osteoarthritis  Average pain intensity:  Last 24 hours: 0-5/10  Past week: 0-5/10  How often does the pt experience symptoms? Constantly  How much have the symptoms interfered with usual daily  activities? Quite a bit  How has condition changed since care began at this facility?  Better standing and walking endurance, less instability and fear of falls  In general, how is the patients overall health? Good   BACK PAIN (STarT Back Screening Tool) No

## 2023-12-13 ENCOUNTER — Ambulatory Visit: Payer: Self-pay

## 2023-12-13 NOTE — Telephone Encounter (Signed)
 FYI Only or Action Required?: FYI only for provider.  Patient was last seen in primary care on 10/11/2023 by Kennyth Worth HERO, MD.  Called Nurse Triage reporting Arm Injury.  Symptoms began several days ago.  Interventions attempted: OTC medications: topical creams.  Symptoms are: gradually worsening.  Triage Disposition: See PCP When Office is Open (Within 3 Days)  Patient/caregiver understands and will follow disposition?: Yes      Reason for Disposition  [1] Looks infected (e.g., spreading redness, pus) AND [2] no fever  Answer Assessment - Initial Assessment Questions 1. ONSET: When did it happen? If happened < 10 minutes ago, ask: Did you wash off the chemical? If not, give First Aid Advice immediately.      Liquid chemical cleaning agent - did not wear gloves 2. CHEMICAL: What is the name of the substance?     Rejuvinate cleaning solution 3. LOCATION: Where is the burn located?      Bilateral  4. BURN SIZE: How large is the burn?  The palm is roughly 1% of the total body surface area (BSA).     3 areas/bumps with swelling and scab 5. SEVERITY OF THE BURN: Is there redness?, Are there any blisters?     Yes, 3 spots 6. MECHANISM: Tell me how it happened.     Attempting to clean without PPE 7. PAIN: Are you having any pain? How bad is the pain? (Scale 0-10; or none, mild, moderate, severe)     Mild-moderate 8. OTHER SYMPTOMS: Do you have any other symptoms?     Generalized itchy rash, pt does endorse that sites may look like ringworm 9. PREGNANCY: Is there any chance you are pregnant? When was your last menstrual period?     N/a    Scheduled patient on the next available AV appt on December 15, 2023 with alternate PCP .  Protocols used: Burns - Chemical-A-AH

## 2023-12-14 NOTE — Telephone Encounter (Signed)
 Patient has an OV on 12/15/2023

## 2023-12-15 ENCOUNTER — Ambulatory Visit: Admitting: Family

## 2023-12-15 ENCOUNTER — Encounter: Payer: Self-pay | Admitting: Family Medicine

## 2023-12-15 ENCOUNTER — Ambulatory Visit: Admitting: Rehabilitative and Restorative Service Providers"

## 2023-12-15 ENCOUNTER — Ambulatory Visit (INDEPENDENT_AMBULATORY_CARE_PROVIDER_SITE_OTHER): Admitting: Family Medicine

## 2023-12-15 ENCOUNTER — Encounter: Payer: Self-pay | Admitting: Rehabilitative and Restorative Service Providers"

## 2023-12-15 VITALS — BP 164/80 | HR 109 | Temp 98.3°F | Wt 177.0 lb

## 2023-12-15 DIAGNOSIS — M6281 Muscle weakness (generalized): Secondary | ICD-10-CM

## 2023-12-15 DIAGNOSIS — M25562 Pain in left knee: Secondary | ICD-10-CM

## 2023-12-15 DIAGNOSIS — M25662 Stiffness of left knee, not elsewhere classified: Secondary | ICD-10-CM

## 2023-12-15 DIAGNOSIS — R600 Localized edema: Secondary | ICD-10-CM | POA: Diagnosis not present

## 2023-12-15 DIAGNOSIS — L280 Lichen simplex chronicus: Secondary | ICD-10-CM | POA: Diagnosis not present

## 2023-12-15 DIAGNOSIS — R262 Difficulty in walking, not elsewhere classified: Secondary | ICD-10-CM | POA: Diagnosis not present

## 2023-12-15 NOTE — Progress Notes (Deleted)
 Patient ID: [name] Good, female    DOB: 06/10/1950, 73 y.o.   MRN: [id number]  No chief complaint on file.  Subjective:    Outpatient Medications Prior to Visit  Medication Sig Dispense Refill   apixaban  (ELIQUIS ) 2.5 MG TABS tablet Take 1 tablet (2.5 mg total) by mouth 2 (two) times daily. 60 tablet 5   atorvastatin  (LIPITOR ) 80 MG tablet Take 1 tablet (80 mg total) by mouth daily. 90 tablet 1   baclofen  (LIORESAL ) 10 MG tablet TAKE 1 TABLET BY MOUTH 3 TIMES DAILY 30 tablet 5   calcium  carbonate (TUMS EX) 750 MG chewable tablet Chew 1 tablet by mouth daily as needed for heartburn.     diphenhydrAMINE  (BENADRYL ) 25 mg capsule Take 25 mg by mouth at bedtime.     docusate sodium  (COLACE) 100 MG capsule Take 100 mg by mouth daily as needed for mild constipation or moderate constipation.     escitalopram  (LEXAPRO ) 20 MG tablet TAKE 1 TABLET BY MOUTH DAILY 90 tablet 3   ezetimibe  (ZETIA ) 10 MG tablet Take 1 tablet (10 mg total) by mouth daily. 90 tablet 3   famotidine  (PEPCID ) 20 MG tablet Take 20 mg by mouth daily as needed for heartburn or indigestion.     oxyCODONE -acetaminophen  (PERCOCET) 10-325 MG tablet Take 1 tablet by mouth 5 (five) times daily. 150 tablet 0   oxyCODONE -acetaminophen  (PERCOCET/ROXICET) 5-325 MG tablet Take 1 tablet by mouth every 6 (six) hours as needed for severe pain (pain score 7-10). (Patient not taking: Reported on 11/08/2023) 30 tablet 0   pantoprazole  (PROTONIX ) 40 MG tablet Take 1 tablet (40 mg total) by mouth 2 (two) times daily before a meal. 60 tablet 1   phentermine  (ADIPEX-P ) 37.5 MG tablet TAKE 1 TABLET BY MOUTH EVERY MORNING (Patient not taking: Reported on 11/08/2023) 30 tablet 2   predniSONE  (DELTASONE ) 5 MG tablet TAKE 1 TABLET BY MOUTH DAILY 90 tablet 3   pregabalin  (LYRICA ) 25 MG capsule Take 1 capsule (25 mg total) by mouth 2 (two) times daily. (Patient not taking: Reported on 11/08/2023) 60 capsule 0   rOPINIRole  (REQUIP ) 3 MG tablet TAKE 1 TABLET BY  MOUTH AT BEDTIME AS NEEDED 90 tablet 3   triamcinolone  ointment (KENALOG ) 0.5 % Apply 1 Application topically 2 (two) times daily. 30 g 0   Vitamin D , Ergocalciferol , (DRISDOL ) 1.25 MG (50000 UNIT) CAPS capsule Take 1 capsule (50,000 Units total) by mouth every 7 (seven) days. 12 capsule 0   No facility-administered medications prior to visit.   Past Medical History:  Diagnosis Date   Anxiety    Arthritis    Depression    GERD (gastroesophageal reflux disease)    Myocardial infarction (HCC) 91   no visits to cardiac dr(thomas kelly) since 92   Stroke (HCC)    Wound dehiscence    lumbar   Past Surgical History:  Procedure Laterality Date   APPENDECTOMY     17 yrs   BACK SURGERY     BREAST SURGERY Left    cyst   BUBBLE STUDY  03/12/2020   Procedure: BUBBLE STUDY;  Surgeon: Claudene Pacific, MD;  Location: [facility];  Service: Cardiovascular;;   CARDIAC CATHETERIZATION  29   CHOLECYSTECTOMY     73 yrs old   ESOPHAGOGASTRODUODENOSCOPY N/A 10/05/2023   Procedure: EGD (ESOPHAGOGASTRODUODENOSCOPY);  Surgeon: San Sandor GAILS, DO;  Location: [facility];  Service: Gastroenterology;  Laterality: N/A;   HOT HEMOSTASIS N/A 10/05/2023   Procedure: EGD, WITH ARGON  PLASMA COAGULATION;  Surgeon: San Sandor GAILS, DO;  Location: [facility];  Service: Gastroenterology;  Laterality: N/A;   LUMBAR LAMINECTOMY/DECOMPRESSION MICRODISCECTOMY N/A 06/14/2015   Procedure: Lumbar Three-Four,Lumbar Four-Five, Lumbar Five-Sacral One Laminectomy;  Surgeon: Alm GORMAN Molt, MD;  Location: [facility];  Service: Neurosurgery;  Laterality: N/A;   LUMBAR WOUND DEBRIDEMENT N/A 07/24/2015   Procedure: lumbar wound revision;  Surgeon: Alm GORMAN Molt, MD;  Location: [facility];  Service: Neurosurgery;  Laterality: N/A;   TEE WITHOUT CARDIOVERSION N/A 03/12/2020   Procedure: TRANSESOPHAGEAL ECHOCARDIOGRAM (TEE);  Surgeon: Claudene Pacific, MD;  Location: [facility] ENDOSCOPY;  Service: Cardiovascular;  Laterality: N/A;    TOTAL HIP ARTHROPLASTY Right 10/25/2015   Procedure: RIGHT TOTAL HIP ARTHROPLASTY ANTERIOR APPROACH;  Surgeon: Lonni CINDERELLA Poli, MD;  Location: [facility];  Service: Orthopedics;  Laterality: Right;   TOTAL KNEE ARTHROPLASTY Left 08/31/2023   Procedure: ARTHROPLASTY, KNEE, TOTAL;  Surgeon: Poli Lonni CINDERELLA, MD;  Location: [facility];  Service: Orthopedics;  Laterality: Left;   TUBAL LIGATION     Allergies  Allergen Reactions   Amoxicillin Other (See Comments)    Tolerated Zosyn  Has patient had a PCN reaction causing immediate rash, facial/tongue/throat swelling, SOB or lightheadedness with hypotension: No Has patient had a PCN reaction causing severe rash involving mucus membranes or skin necrosis: No Has patient had a PCN reaction that required hospitalization No Has patient had a PCN reaction occurring within the last 10 years: No If all of the above answers are NO, then may proceed with Cephalosporin use.      Objective:    Physical Exam Vitals and nursing note reviewed.  Constitutional:      Appearance: Normal appearance.  Cardiovascular:     Rate and Rhythm: Normal rate and regular rhythm.  Pulmonary:     Effort: Pulmonary effort is normal.     Breath sounds: Normal breath sounds.  Musculoskeletal:        General: Normal range of motion.  Skin:    General: Skin is warm and dry.  Neurological:     Mental Status: She is alert.  Psychiatric:        Mood and Affect: Mood normal.        Behavior: Behavior normal.    There were no vitals taken for this visit. Wt Readings from Last 3 Encounters:  11/08/23 188 lb (85.3 kg)  10/11/23 188 lb (85.3 kg)  10/05/23 185 lb 3 oz (84 kg)       Lucius Krabbe, NP

## 2023-12-15 NOTE — Progress Notes (Unsigned)
 Established Patient Office Visit  Subjective   Patient ID: [name] Good, female    DOB: 08-17-1950  Age: 73 y.o. MRN: [id number]  Chief Complaint  Patient presents with   Skin Problem    HPI  {History (Optional):23778} [name] Good is seen for evaluation of what sounds like chronic skin condition.  She is has some chronic bilateral rash and lichenification of both forearms especially left forearm.  She has been prescribed steroids topically by her primary in the past and states she went to urgent care in [place] last week and was given doxycycline  and apparently another steroid cream.  She has used moisturizers in the past though not consistently.  She states that recently she has had a black film that rubs off her arms at times.  She has involvement of predominantly forearms.  Does have some dryness and mild pruritus lower legs as well.  Past Medical History:  Diagnosis Date   Anxiety    Arthritis    Depression    GERD (gastroesophageal reflux disease)    Myocardial infarction (HCC) 89   no visits to cardiac dr(thomas kelly) since 89   Stroke (HCC)    Wound dehiscence    lumbar   Past Surgical History:  Procedure Laterality Date   APPENDECTOMY     17 yrs   BACK SURGERY     BREAST SURGERY Left    cyst   BUBBLE STUDY  03/12/2020   Procedure: BUBBLE STUDY;  Surgeon: Claudene Pacific, MD;  Location: [facility];  Service: Cardiovascular;;   CARDIAC CATHETERIZATION  64   CHOLECYSTECTOMY     73 yrs old   ESOPHAGOGASTRODUODENOSCOPY N/A 10/05/2023   Procedure: EGD (ESOPHAGOGASTRODUODENOSCOPY);  Surgeon: San Sandor GAILS, DO;  Location: [facility];  Service: Gastroenterology;  Laterality: N/A;   HOT HEMOSTASIS N/A 10/05/2023   Procedure: EGD, WITH ARGON PLASMA COAGULATION;  Surgeon: San Sandor GAILS, DO;  Location: [facility];  Service: Gastroenterology;  Laterality: N/A;   LUMBAR LAMINECTOMY/DECOMPRESSION MICRODISCECTOMY N/A 06/14/2015   Procedure: Lumbar  Three-Four,Lumbar Four-Five, Lumbar Five-Sacral One Laminectomy;  Surgeon: Alm GORMAN Molt, MD;  Location: [facility];  Service: Neurosurgery;  Laterality: N/A;   LUMBAR WOUND DEBRIDEMENT N/A 07/24/2015   Procedure: lumbar wound revision;  Surgeon: Alm GORMAN Molt, MD;  Location: [facility];  Service: Neurosurgery;  Laterality: N/A;   TEE WITHOUT CARDIOVERSION N/A 03/12/2020   Procedure: TRANSESOPHAGEAL ECHOCARDIOGRAM (TEE);  Surgeon: Claudene Pacific, MD;  Location: [facility];  Service: Cardiovascular;  Laterality: N/A;   TOTAL HIP ARTHROPLASTY Right 10/25/2015   Procedure: RIGHT TOTAL HIP ARTHROPLASTY ANTERIOR APPROACH;  Surgeon: Lonni CINDERELLA Poli, MD;  Location: [facility];  Service: Orthopedics;  Laterality: Right;   TOTAL KNEE ARTHROPLASTY Left 08/31/2023   Procedure: ARTHROPLASTY, KNEE, TOTAL;  Surgeon: Poli Lonni CINDERELLA, MD;  Location: [facility];  Service: Orthopedics;  Laterality: Left;   TUBAL LIGATION      reports that she quit smoking about 10 years ago. Her smoking use included cigarettes. She started smoking about 50 years ago. She has a 80 pack-year smoking history. She has never used smokeless tobacco. She reports that she does not currently use alcohol. She reports that she does not use drugs. family history includes Stroke in her mother. Allergies  Allergen Reactions   Amoxicillin Other (See Comments)    Tolerated Zosyn  Has patient had a PCN reaction causing immediate rash, facial/tongue/throat swelling, SOB or lightheadedness with hypotension: No Has patient had a PCN reaction causing severe rash involving  mucus membranes or skin necrosis: No Has patient had a PCN reaction that required hospitalization No Has patient had a PCN reaction occurring within the last 10 years: No If all of the above answers are NO, then may proceed with Cephalosporin use.    Review of Systems  Constitutional:  Negative for chills and fever.  Skin:  Positive for rash.      Objective:      BP (!) 164/80   Pulse (!) 109   Temp 98.3 F (36.8 C) (Oral)   Wt 177 lb (80.3 kg)   SpO2 95%   BMI 31.35 kg/m  {Vitals History (Optional):23777}  Physical Exam Vitals reviewed.  Constitutional:      General: She is not in acute distress.    Appearance: She is not ill-appearing.  Cardiovascular:     Rate and Rhythm: Normal rate and regular rhythm.  Skin:    Findings: Rash present.     Comments: She has significant lichenification of the skin especially left forearm but also right forearm dorsally and laterally.  Skin feels very rough and leathery and thickened in several places.  No signs of secondary infection.  Neurological:     Mental Status: She is alert.      No results found for any visits on 12/15/23.  {Labs (Optional):23779}  The ASCVD Risk score (Arnett DK, et al., 2019) failed to calculate for the following reasons:   Risk score cannot be calculated because patient has a medical history suggesting prior/existing ASCVD    Assessment & Plan:   Lichenified skin especially involving forearms.  -Discussed importance of avoiding scratching and rubbing this frequently - Continue topical steroid with triamcinolone  and combined with liberal use of moisturizer such as CeraVe, Eucerin, or Lac-Hydrin and use moisturizer several times daily. - Avoid prolonged bathing and excessively hot water - She has pending appointment with the rheumatologist.   Wolm Scarlet, MD

## 2023-12-15 NOTE — Therapy (Signed)
 OUTPATIENT PHYSICAL THERAPY TREATMENT NOTE   Patient Name: [name] Good MRN: [id number] DOB:May 17, 1950, 73 y.o., female Today's Date: 12/15/2023  END OF SESSION:  PT End of Session - 12/15/23 1355     Visit Number 14    Number of Visits 24    Date for PT Re-Evaluation 01/10/24    Authorization Type [employer] Medicare    Authorization Time Period [facility] MEDICARE $15 COPAY 16 VISITS APPROVED 7/2-8/27/25    Authorization - Visit Number 9    Authorization - Number of Visits 16    Progress Note Due on Visit 10    PT Start Time 1343    PT Stop Time 1422    PT Time Calculation (min) 39 min    Equipment Utilized During Treatment --    Activity Tolerance Patient limited by pain    Behavior During Therapy WFL for tasks assessed/performed                Past Medical History:  Diagnosis Date   Anxiety    Arthritis    Depression    GERD (gastroesophageal reflux disease)    Myocardial infarction (HCC) 89   no visits to cardiac dr(thomas kelly) since 92   Stroke (HCC)    Wound dehiscence    lumbar   Past Surgical History:  Procedure Laterality Date   APPENDECTOMY     17 yrs   BACK SURGERY     BREAST SURGERY Left    cyst   BUBBLE STUDY  03/12/2020   Procedure: BUBBLE STUDY;  Surgeon: Claudene Pacific, MD;  Location: [facility];  Service: Cardiovascular;;   CARDIAC CATHETERIZATION  86   CHOLECYSTECTOMY     73 yrs old   ESOPHAGOGASTRODUODENOSCOPY N/A 10/05/2023   Procedure: EGD (ESOPHAGOGASTRODUODENOSCOPY);  Surgeon: San Sandor GAILS, DO;  Location: [facility];  Service: Gastroenterology;  Laterality: N/A;   HOT HEMOSTASIS N/A 10/05/2023   Procedure: EGD, WITH ARGON PLASMA COAGULATION;  Surgeon: San Sandor GAILS, DO;  Location: [facility];  Service: Gastroenterology;  Laterality: N/A;   LUMBAR LAMINECTOMY/DECOMPRESSION MICRODISCECTOMY N/A 06/14/2015   Procedure: Lumbar Three-Four,Lumbar Four-Five, Lumbar Five-Sacral One Laminectomy;  Surgeon: Alm GORMAN Molt, MD;  Location:  [facility];  Service: Neurosurgery;  Laterality: N/A;   LUMBAR WOUND DEBRIDEMENT N/A 07/24/2015   Procedure: lumbar wound revision;  Surgeon: Alm GORMAN Molt, MD;  Location: [facility];  Service: Neurosurgery;  Laterality: N/A;   TEE WITHOUT CARDIOVERSION N/A 03/12/2020   Procedure: TRANSESOPHAGEAL ECHOCARDIOGRAM (TEE);  Surgeon: Claudene Pacific, MD;  Location: [facility];  Service: Cardiovascular;  Laterality: N/A;   TOTAL HIP ARTHROPLASTY Right 10/25/2015   Procedure: RIGHT TOTAL HIP ARTHROPLASTY ANTERIOR APPROACH;  Surgeon: Lonni CINDERELLA Poli, MD;  Location: [facility];  Service: Orthopedics;  Laterality: Right;   TOTAL KNEE ARTHROPLASTY Left 08/31/2023   Procedure: ARTHROPLASTY, KNEE, TOTAL;  Surgeon: Poli Lonni CINDERELLA, MD;  Location: [facility];  Service: Orthopedics;  Laterality: Left;   TUBAL LIGATION     Patient Active Problem List   Diagnosis Date Noted   Iron deficiency anemia 10/04/2023   Dermatitis 10/01/2023   GI bleed 10/01/2023   OA (osteoarthritis) of knee 08/31/2023   Status post total left knee replacement 08/31/2023   Senile purpura (HCC) 08/05/2023   Supratherapeutic INR 07/05/2023   Unilateral primary osteoarthritis, right knee 06/09/2023   Vitamin D  deficiency 02/16/2023   Dyslipidemia associated with type 2 diabetes mellitus (HCC) 01/01/2023   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   History of CVA (cerebrovascular accident)  12/07/2022   T2DM (type 2 diabetes mellitus) (HCC) 08/11/2022   Chronic pain syndrome 10/30/2021   Morbid obesity (HCC) 09/02/2021   Depression, major, single episode, moderate (HCC) 07/15/2021   Vertebral artery stenosis 07/15/2021   Cerebrovascular disease 07/15/2021   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   RLS (restless legs syndrome) 10/02/2019   Arthritis 10/18/2017   Former smoker 11/13/2016   Autoimmune disease (HCC) 08/12/2016   Obesity (BMI 35.0-39.9 without comorbidity) 08/12/2016   Status post total replacement of right  hip 10/25/2015   S/P lumbar laminectomy 06/14/2015    PCP: Worth CHRISTELLA Kitty, MD  REFERRING PROVIDER: Lonni CINDERELLA Poli, MD  REFERRING DIAG:  Diagnosis  [phone] (ICD-10-CM) - Status post total left knee replacement    THERAPY DIAG:  Difficulty in walking, not elsewhere classified  Muscle weakness (generalized)  Localized edema  Stiffness of left knee, not elsewhere classified  Acute pain of left knee  Rationale for Evaluation and Treatment: Rehabilitation  ONSET DATE: Aug 31, 2023  SUBJECTIVE:   SUBJECTIVE STATEMENT:  Pt indicated Lt knee pain 1/10.   Pt indicated Rt leg 2-3/10.  Pt indicated doing   PERTINENT HISTORY: MI, stroke, back surgery (lumbar laminectomy with decompression and microdiscectomy), right THA, left TKA, former smoker and current vapor  PAIN:   NPRS scale: 1-3/10 Pain location: Rt knee and hip for 3/10, Lt knee 1/10 Pain description: Achy, stiff, sore, can be sharp with weight-bearing Aggravating factors: Overuse with WB, maintaining prolonged postures Relieving factors: Taking a break from weight-bearing  PRECAUTIONS: Back  RED FLAGS: None   WEIGHT BEARING RESTRICTIONS: No  FALLS:  Has patient fallen in last 6 months? No  LIVING ENVIRONMENT: Lives with: lives with their family and lives with their spouse Lives in: Mobile home Stairs: Needs handrail Has following equipment at home: Single point cane, Environmental consultant - 2 wheeled, and Tour manager  OCCUPATION: Retired  PLOF: Independent with basic ADLs  PATIENT GOALS: Return to painting birdhouses  NEXT MD VISIT: October 16 at 1:30  OBJECTIVE:  Note: Objective measures were completed at Evaluation unless otherwise noted.  DIAGNOSTIC FINDINGS: Interval total left knee arthroplasty without evidence of hardware failure.  PATIENT SURVEYS:   Patient-Specific Activity Scoring Scheme  0 represents "unable to perform." 10 represents "able to perform at prior level. 0 1 2 3 4 5 6 7 8 9   10 (Date and Score)   Activity Eval  10/27/2023 11/26/2023   1.  Sit to standing transfers   3  7.5   2. Walking independently  4  5  3.     4.    5.    Score 3.5 6.25   Total score = sum of the activity scores/number of activities Minimum detectable change (90%CI) for average score = 2 points Minimum detectable change (90%CI) for single activity score = 3 points  COGNITION: 10/27/2023 Overall cognitive status: Within functional limits for tasks assessed     SENSATION: .10/27/2023 Occasionally, not now.  Jalyn does note dizziness though.  EDEMA:  10/27/2023 Noted and not objectively assessed   LOWER EXTREMITY ROM:  Active ROM Left/Right  10/27/2023 Left  11/03/23 Left  11/12/23  supine Left 11/26/2023    Hip flexion       Hip extension       Hip abduction       Hip adduction       Hip internal rotation       Hip external rotation       Knee  flexion 103/126 105 112* 110   Knee extension -10/-5 -7 -9* -4   Ankle dorsiflexion       Ankle plantarflexion       Ankle inversion       Ankle eversion        (Blank rows = not tested)  LOWER EXTREMITY MMT:  MMT Right  10/27/2023 Left 10/27/2023 Left/Right 11/26/2023 Left/Right 12/10/2023  Hip flexion      Hip extension      Hip abduction      Hip adduction      Hip internal rotation      Hip external rotation      Knee flexion      Knee extension 4- 2+ 50.5/53.2 50.5/41.0  Ankle dorsiflexion      Ankle plantarflexion      Ankle inversion      Ankle eversion       (Blank rows = not tested)  GAIT: 11/24/2023: FWW into clinic.  Trial of SPC in Lt hand to help Rt leg improved steadiness in gait despite Lt knee surgery.  This is due to past troubles with Rt leg.   Eval:  Distance walked: 200 feet Assistive device utilized: Environmental consultant - 2 wheeled Level of assistance: Complete Independence Comments: Reece notes she has been using an assistive device for years due to bilateral knee pain                      TREATMENT        DATE:  12/15/2023 Therex: Standing hip abduction x 10 , performed bilaterally with bilateral HHA on bar    TherActivity Lateral stepping in // bars 10 ft x 3 each way with SBA and hand on bar.  Sit to stand from bar stool for slow lowering focus x 10  Nustep Lvl 5 5 mins UE/LE for ROM, endurance, push/pull and walking pattern improvements.   Neuro Re-ed Feet together stance EO 30 seconds, EC 30 seconds with SBA Modified tandem stance foot forward 30 sec x 2 bilateral with SBA Standing alternating PF/DF 2 x 10 with light hand assist on bar.    TREATMENT        DATE:  12/10/2023 Seated straight leg raises 2 sets of 5  Quad sets 2 sets of 10 for 5 seconds  Neuromuscular re-education: Feet together on foam: Eyes open; Eyes closed 5 x 20 seconds Dynamic heel to toe raises 2 sets of 10 x (PT assist as needed for balance, cues for step strategy) 1 set with hands, 1 set no hands Dynamic balance with cane (long strides and sequence left hand/cane with right foot)  Functional Activities: Mini-sit to stands in parallel bars 5 x slow eccentrics Hip hike in parallel bars 20 x (for gait with cane) One sided hip hike in parallel bars 10 x 3 seconds each side   TREATMENT        DATE8/13/2025 Neuromuscular re-education: Feet together on foam: Eyes open; Eyes closed 5 x 20 seconds Dynamic Tandem balance (< 1 gap heel to toe) 6 laps forwards and backwards Dynamic heel to toe raises 15 x (PT assist as needed for balance, cues for step strategy) Dynamic balance with cane (long strides and sequence left hand/cane with right foot)  Functional Activities: Mini-sit to stands in parallel bars 10 x slow eccentrics Hip hike in parallel bars 20 x (for gait with cane) One sided hip hike in parallel bars 10 x 3 seconds each side Double Leg  Press 100# and 75# 10 x each full extension and slowly into flexion for control with stairs and stand to sit Single Leg Press 50# and 31# 10 X each full extension and slowly  into flexion for control with stairs and stand to sit (needed PT assist with Rt leg only)   TREATMENT        DATE8/09/2023 Neuromuscular re-education: Feet together on foam: Eyes open; Head turning; eyes closed Tandem balance (1 gap heel to toe) 6 x 20 seconds Dynamic balance with cane (long strides and sequence left hand/cane with right foot)  Functional Activities: Tap-ups without hands on 4 inch step 4 x  Step-up and back off 6 inch step with left hand on parallel bars 20 x Mini-sit to stands in parallel bars 10 x slow eccentrics Hip hike in parallel bars 20 x (for gait with cane) One sided hip hike in parallel bars 10 x 3 seconds each side   PATIENT EDUCATION:  Eval  Education details: See above Person educated: Patient and Child(ren) Education method: Explanation, Demonstration, Tactile cues, Verbal cues, and Handouts Education comprehension: verbalized understanding, returned demonstration, verbal cues required, tactile cues required, and needs further education  HOME EXERCISE PROGRAM: Access Code: [email or web address] URL: [email or web address] Date: 12/10/2023 Prepared by: Lamar Ivory  Exercises - Supine Quadricep Sets  - 3 x daily - 7 x weekly - 2 sets - 10 reps - 5 second hold - Seated Knee Flexion AAROM  - 3 x daily - 7 x weekly - 1 sets - 5 reps - 10 seconds hold - Tandem Stance  - 1 x daily - 7 x weekly - 1 sets - 5 reps - 20 second hold - Standing Hip Hiking  - 2 x daily - 7 x weekly - 2 sets - 10 reps - 3 seconds hold - Clamshell  - 2 x daily - 7 x weekly - 2 sets - 10 reps - 3 seconds hold - Yoga Bridge  - 2 x daily - 7 x weekly - 1 sets - 10 reps - 5 seconds hold - Seated Active Straight-Leg Raise  - 3 x daily - 7 x weekly - 2 sets - 5 reps  ASSESSMENT:  CLINICAL IMPRESSION:  Rt leg deficits and pain symptoms continued to be primary at this time for limitations in daily activity.  Continued focus on general balance improvements and functional LE strength  bilateral to improve stability in ambulation overall.  Fatigue noted in activity and most limitations noted from Rt leg symptoms when noted.    OBJECTIVE IMPAIRMENTS: Abnormal gait, cardiopulmonary status limiting activity, decreased activity tolerance, decreased balance, decreased endurance, decreased knowledge of condition, difficulty walking, decreased ROM, decreased strength, increased edema, impaired perceived functional ability, obesity, and pain.   ACTIVITY LIMITATIONS: carrying, lifting, bending, standing, squatting, sleeping, stairs, transfers, bed mobility, and locomotion level  PARTICIPATION LIMITATIONS: meal prep, cleaning, shopping, and community activity  PERSONAL FACTORS: MI, stroke, back surgery (lumbar laminectomy with decompression and microdiscectomy), right THA, left TKA, former smoker and current vapor are also affecting patient's functional outcome.   REHAB POTENTIAL: Good  CLINICAL DECISION MAKING: Stable/uncomplicated  EVALUATION COMPLEXITY: Low   GOALS: Goals reviewed with patient? Yes  SHORT TERM GOALS: Target date: 11/24/2023 Raushanah will be independent with her day 1 home exercise program Baseline: Started 10/27/2023 Goal status:   Met 11/19/2023  2.  Improve left knee active range of motion to 0 - 5 -110 degrees Baseline: 0 - 10 - 103 degrees Goal status: Met  11/26/2023   LONG TERM GOALS: Target date: 01/10/2024  Improve patient specific functional score to 5.5 Baseline: 3.5 Goal status: Met 11/26/2023  2.  Calyse will report left knee pain consistently 0-3/10 on the visual analog scale Baseline: 0-5/10 Goal status: Ongoing 12/08/2023  3.  Improve left knee active range of motion to 0 - 3 - 115 degrees Baseline: 0 - 10 - 103 degrees Goal status: Ongoing  11/26/2023  4.  Improve bilateral knee strength for quadriceps to at least 4/5 MMT Baseline: See objective Goal status: Met 11/26/2023  5.  Emillia will be independent and compliant with her long-term home  maintenance exercise program at discharge Baseline: Started 10/27/2023 Goal status: Ongoing  12/08/2023   PLAN:  PT FREQUENCY: 2 x a week  PT DURATION: Through 01/10/2024  PLANNED INTERVENTIONS: 97750- Physical Performance Testing, 97110-Therapeutic exercises, 97530- Therapeutic activity, 97112- Neuromuscular re-education, 97535- Self Care, 02859- Manual therapy, [id number]- Gait training, [phone]- Electrical stimulation (unattended), 97016- Vasopneumatic device, Patient/Family education, Balance training, Stair training, Joint mobilization, and Cryotherapy  PLAN FOR NEXT SESSION:   Balance, LE strengthening as able.   SPC in Lt hand to help improve Rt leg stability as Rt abduction weakness is most limiting.       Ozell Silvan, PT, DPT, OCS, ATC 12/15/23  2:23 PM      Date of referral: 10/13/2023 Referring provider: Lonni CINDERELLA Poli, MD Referring diagnosis?  Diagnosis  Z96.652 (ICD-10-CM) - Status post total left knee replacement   Treatment diagnosis? (if different than referring diagnosis) R26.2   R60.0   M62.81   M25.662   M25.562  What was this (referring dx) caused by? Surgery (Type: TKA) and Arthritis  Nature of Condition: Initial Onset (within last 3 months)   Laterality: Lt  Current Functional Measure Score: Patient Specific Functional Scale 3.5  Objective measurements identify impairments when they are compared to normal values, the uninvolved extremity, and prior level of function.  [x]  Yes  []  No  Objective assessment of functional ability: Severe functional limitations   Briefly describe symptoms: Walking with a cane, poor sleep, poor function post total knee replacement  How did symptoms start: Osteoarthritis  Average pain intensity:  Last 24 hours: 0-5/10  Past week: 0-5/10  How often does the pt experience symptoms? Constantly  How much have the symptoms interfered with usual daily activities? Quite a bit  How has condition changed since care  began at this facility?  Better standing and walking endurance, less instability and fear of falls  In general, how is the patients overall health? Good   BACK PAIN (STarT Back Screening Tool) No

## 2023-12-15 NOTE — Patient Instructions (Signed)
 Use gentle soap such as Dove  Avoid prolonged bathing  Continue to use the steroid cream twice daily as needed  AVOID scratching and frequent rubbing of skin  Use liberal amount of moisturizer several times per day such Eucerin, Lac-Hydrin, or CeraVe

## 2023-12-16 ENCOUNTER — Encounter: Payer: Self-pay | Admitting: Family Medicine

## 2023-12-16 ENCOUNTER — Ambulatory Visit: Admitting: Family Medicine

## 2023-12-17 ENCOUNTER — Encounter: Payer: Self-pay | Admitting: Rehabilitative and Restorative Service Providers"

## 2023-12-17 ENCOUNTER — Ambulatory Visit (INDEPENDENT_AMBULATORY_CARE_PROVIDER_SITE_OTHER): Admitting: Rehabilitative and Restorative Service Providers"

## 2023-12-17 DIAGNOSIS — M25562 Pain in left knee: Secondary | ICD-10-CM | POA: Diagnosis not present

## 2023-12-17 DIAGNOSIS — R600 Localized edema: Secondary | ICD-10-CM

## 2023-12-17 DIAGNOSIS — R262 Difficulty in walking, not elsewhere classified: Secondary | ICD-10-CM | POA: Diagnosis not present

## 2023-12-17 DIAGNOSIS — M6281 Muscle weakness (generalized): Secondary | ICD-10-CM

## 2023-12-17 DIAGNOSIS — M25662 Stiffness of left knee, not elsewhere classified: Secondary | ICD-10-CM

## 2023-12-17 NOTE — Therapy (Signed)
 OUTPATIENT PHYSICAL THERAPY TREATMENT NOTE   Patient Name: [name] Good MRN: [id number] DOB:[birth year], 73 y.o., female Today's Date: 12/17/2023  END OF SESSION:  PT End of Session - 12/17/23 1111     Visit Number 15    Number of Visits 24    Date for PT Re-Evaluation 01/10/24    Authorization Type UHC Medicare    Authorization Time Period [facility] MEDICARE $15 COPAY 16 VISITS APPROVED 7/2-8/27/25    Authorization - Visit Number 15    Authorization - Number of Visits 16    Progress Note Due on Visit --    PT Start Time 1059    PT Stop Time 1139    PT Time Calculation (min) 40 min    Activity Tolerance Patient limited by pain    Behavior During Therapy WFL for tasks assessed/performed                 Past Medical History:  Diagnosis Date   Anxiety    Arthritis    Depression    GERD (gastroesophageal reflux disease)    Myocardial infarction (HCC) 89   no visits to cardiac dr(thomas kelly) since 89   Stroke (HCC)    Wound dehiscence    lumbar   Past Surgical History:  Procedure Laterality Date   APPENDECTOMY     17 yrs   BACK SURGERY     BREAST SURGERY Left    cyst   BUBBLE STUDY  03/12/2020   Procedure: BUBBLE STUDY;  Surgeon: Claudene Pacific, MD;  Location: [facility];  Service: Cardiovascular;;   CARDIAC CATHETERIZATION  48   CHOLECYSTECTOMY     73 yrs old   ESOPHAGOGASTRODUODENOSCOPY N/A 10/05/2023   Procedure: EGD (ESOPHAGOGASTRODUODENOSCOPY);  Surgeon: San Sandor GAILS, DO;  Location: [facility];  Service: Gastroenterology;  Laterality: N/A;   HOT HEMOSTASIS N/A 10/05/2023   Procedure: EGD, WITH ARGON PLASMA COAGULATION;  Surgeon: San Sandor GAILS, DO;  Location: [facility];  Service: Gastroenterology;  Laterality: N/A;   LUMBAR LAMINECTOMY/DECOMPRESSION MICRODISCECTOMY N/A 06/14/2015   Procedure: Lumbar Three-Four,Lumbar Four-Five, Lumbar Five-Sacral One Laminectomy;  Surgeon: Alm GORMAN Molt, MD;  Location: [facility];  Service: Neurosurgery;   Laterality: N/A;   LUMBAR WOUND DEBRIDEMENT N/A 07/24/2015   Procedure: lumbar wound revision;  Surgeon: Alm GORMAN Molt, MD;  Location: [facility];  Service: Neurosurgery;  Laterality: N/A;   TEE WITHOUT CARDIOVERSION N/A 03/12/2020   Procedure: TRANSESOPHAGEAL ECHOCARDIOGRAM (TEE);  Surgeon: Claudene Pacific, MD;  Location: [facility] ENDOSCOPY;  Service: Cardiovascular;  Laterality: N/A;   TOTAL HIP ARTHROPLASTY Right 10/25/2015   Procedure: RIGHT TOTAL HIP ARTHROPLASTY ANTERIOR APPROACH;  Surgeon: Lonni CINDERELLA Poli, MD;  Location: [facility];  Service: Orthopedics;  Laterality: Right;   TOTAL KNEE ARTHROPLASTY Left 08/31/2023   Procedure: ARTHROPLASTY, KNEE, TOTAL;  Surgeon: Poli Lonni CINDERELLA, MD;  Location: [facility] OR;  Service: Orthopedics;  Laterality: Left;   TUBAL LIGATION     Patient Active Problem List   Diagnosis Date Noted   Iron deficiency anemia 10/04/2023   Dermatitis 10/01/2023   GI bleed 10/01/2023   OA (osteoarthritis) of knee 08/31/2023   Status post total left knee replacement 08/31/2023   Senile purpura (HCC) 08/05/2023   Supratherapeutic INR 07/05/2023   Unilateral primary osteoarthritis, right knee 06/09/2023   Vitamin D  deficiency 02/16/2023   Dyslipidemia associated with type 2 diabetes mellitus (HCC) 01/01/2023   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   History of CVA (cerebrovascular accident) 12/07/2022   T2DM (type 2 diabetes  mellitus) (HCC) 08/11/2022   Chronic pain syndrome 10/30/2021   Morbid obesity (HCC) 09/02/2021   Depression, major, single episode, moderate (HCC) 07/15/2021   Vertebral artery stenosis 07/15/2021   Cerebrovascular disease 07/15/2021   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   RLS (restless legs syndrome) 10/02/2019   Arthritis 10/18/2017   Former smoker 11/13/2016   Autoimmune disease (HCC) 08/12/2016   Obesity (BMI 35.0-39.9 without comorbidity) 08/12/2016   Status post total replacement of right hip 10/25/2015   S/P lumbar  laminectomy 06/14/2015    PCP: Worth CHRISTELLA Kitty, MD  REFERRING PROVIDER: Lonni CINDERELLA Poli, MD  REFERRING DIAG:  Diagnosis  [phone] (ICD-10-CM) - Status post total left knee replacement    THERAPY DIAG:  Difficulty in walking, not elsewhere classified  Muscle weakness (generalized)  Localized edema  Stiffness of left knee, not elsewhere classified  Acute pain of left knee  Rationale for Evaluation and Treatment: Rehabilitation  ONSET DATE: Aug 31, 2023  SUBJECTIVE:   SUBJECTIVE STATEMENT:  Pt indicated 4/10 upon arrival for Lt anterior hip.  Reported doing things around the house for the first time in a while and reported being tired.  Did some cleaning and vacuum activity.    PERTINENT HISTORY: MI, stroke, back surgery (lumbar laminectomy with decompression and microdiscectomy), right THA, left TKA, former smoker and current vapor  PAIN:   NPRS scale: up to 4/10  Pain location: Rt knee and hip for 3/10, Lt hip Pain description: Achy, stiff, sore, can be sharp with weight-bearing Aggravating factors: Overuse with WB, maintaining prolonged postures Relieving factors: Taking a break from weight-bearing  PRECAUTIONS: Back  RED FLAGS: None   WEIGHT BEARING RESTRICTIONS: No  FALLS:  Has patient fallen in last 6 months? No  LIVING ENVIRONMENT: Lives with: lives with their family and lives with their spouse Lives in: Mobile home Stairs: Needs handrail Has following equipment at home: Single point cane, Environmental consultant - 2 wheeled, and Tour manager  OCCUPATION: Retired  PLOF: Independent with basic ADLs  PATIENT GOALS: Return to painting birdhouses  NEXT MD VISIT: October 16 at 1:30  OBJECTIVE:  Note: Objective measures were completed at Evaluation unless otherwise noted.  DIAGNOSTIC FINDINGS: Interval total left knee arthroplasty without evidence of hardware failure.  PATIENT SURVEYS:   Patient-Specific Activity Scoring Scheme  0 represents "unable to  perform." 10 represents "able to perform at prior level. 0 1 2 3 4 5 6 7 8 9  10 (Date and Score)   Activity Eval  10/27/2023 11/26/2023   1.  Sit to standing transfers   3  7.5   2. Walking independently  4  5  3.     4.    5.    Score 3.5 6.25   Total score = sum of the activity scores/number of activities Minimum detectable change (90%CI) for average score = 2 points Minimum detectable change (90%CI) for single activity score = 3 points  COGNITION: 10/27/2023 Overall cognitive status: Within functional limits for tasks assessed     SENSATION: .10/27/2023 Occasionally, not now.  Shala does note dizziness though.  EDEMA:  10/27/2023 Noted and not objectively assessed   LOWER EXTREMITY ROM:  Active ROM Left/Right  10/27/2023 Left  11/03/23 Left  11/12/23  supine Left 11/26/2023    Hip flexion       Hip extension       Hip abduction       Hip adduction       Hip internal rotation  Hip external rotation       Knee flexion 103/126 105 112* 110   Knee extension -10/-5 -7 -9* -4   Ankle dorsiflexion       Ankle plantarflexion       Ankle inversion       Ankle eversion        (Blank rows = not tested)  LOWER EXTREMITY MMT:  MMT Right  10/27/2023 Left 10/27/2023 Left/Right 11/26/2023 Left/Right 12/10/2023  Hip flexion      Hip extension      Hip abduction      Hip adduction      Hip internal rotation      Hip external rotation      Knee flexion      Knee extension 4- 2+ 50.5/53.2 50.5/41.0  Ankle dorsiflexion      Ankle plantarflexion      Ankle inversion      Ankle eversion       (Blank rows = not tested)  GAIT: 12/17/2023: Arrival to clinic c FWW.  SPC use in clinic with cane in Lt UE with supervision guarding primarily   11/24/2023: FWW into clinic.  Trial of SPC in Lt hand to help Rt leg improved steadiness in gait despite Lt knee surgery.  This is due to past troubles with Rt leg.   Eval:  Distance walked: 200 feet Assistive device utilized: Environmental consultant - 2  wheeled Level of assistance: Complete Independence Comments: Aliea notes she has been using an assistive device for years due to bilateral knee pain                      TREATMENT        DATE: 12/17/2023 Therex: Supine hooklying clam shell with contralateral leg held still green band x 20 bilaterally Supine hooklying glute set. 5 sec hold x 10 Supine hooklying ball squeeze 5 sec hold x 10    TherActivity Nustep Lvl 5 5 mins x 2  UE/LE for ROM, endurance, push/pull and walking pattern improvements.  2 mins rest break in middle of activity to limit fatigue.  Sit to stand to sit from elevated table 24 inch height  x 10 , 2 x 5 with slow lowering focus    Neuro Re-ed [facility] pew anterior/posterior ankle strategy weight shifting 1.5 mins with SBA to CGA at times.  Retro stepping response x 10 in standing bilaterally with SBA.  Difficulty loading Rt leg to move Lt leg posterior.     TREATMENT        DATE: 12/15/2023 Therex: Standing hip abduction x 10 , performed bilaterally with bilateral HHA on bar    TherActivity Lateral stepping in // bars 10 ft x 3 each way with SBA and hand on bar.  Sit to stand from bar stool for slow lowering focus x 10  Nustep Lvl 5 5 mins UE/LE for ROM, endurance, push/pull and walking pattern improvements.   Neuro Re-ed Feet together stance EO 30 seconds, EC 30 seconds with SBA Modified tandem stance foot forward 30 sec x 2 bilateral with SBA Standing alternating PF/DF 2 x 10 with light hand assist on bar.    TREATMENT        DATE:  12/10/2023 Seated straight leg raises 2 sets of 5  Quad sets 2 sets of 10 for 5 seconds  Neuromuscular re-education: Feet together on foam: Eyes open; Eyes closed 5 x 20 seconds Dynamic heel to toe raises 2 sets of 10 x (  PT assist as needed for balance, cues for step strategy) 1 set with hands, 1 set no hands Dynamic balance with cane (long strides and sequence left hand/cane with right foot)  Functional  Activities: Mini-sit to stands in parallel bars 5 x slow eccentrics Hip hike in parallel bars 20 x (for gait with cane) One sided hip hike in parallel bars 10 x 3 seconds each side    PATIENT EDUCATION:  Eval  Education details: See above Person educated: Patient and Child(ren) Education method: Explanation, Demonstration, Tactile cues, Verbal cues, and Handouts Education comprehension: verbalized understanding, returned demonstration, verbal cues required, tactile cues required, and needs further education  HOME EXERCISE PROGRAM: Access Code: [email or web address] URL: [email or web address] Date: 12/10/2023 Prepared by: Lamar Ivory  Exercises - Supine Quadricep Sets  - 3 x daily - 7 x weekly - 2 sets - 10 reps - 5 second hold - Seated Knee Flexion AAROM  - 3 x daily - 7 x weekly - 1 sets - 5 reps - 10 seconds hold - Tandem Stance  - 1 x daily - 7 x weekly - 1 sets - 5 reps - 20 second hold - Standing Hip Hiking  - 2 x daily - 7 x weekly - 2 sets - 10 reps - 3 seconds hold - Clamshell  - 2 x daily - 7 x weekly - 2 sets - 10 reps - 3 seconds hold - Yoga Bridge  - 2 x daily - 7 x weekly - 1 sets - 10 reps - 5 seconds hold - Seated Active Straight-Leg Raise  - 3 x daily - 7 x weekly - 2 sets - 5 reps  ASSESSMENT:  CLINICAL IMPRESSION:  Mixed in supine and sitting activity today with LE strengthening (hip included) to adjusted to fatigue reported upon arrival.   Difficulty in loading Rt leg still noted in functional.   OBJECTIVE IMPAIRMENTS: Abnormal gait, cardiopulmonary status limiting activity, decreased activity tolerance, decreased balance, decreased endurance, decreased knowledge of condition, difficulty walking, decreased ROM, decreased strength, increased edema, impaired perceived functional ability, obesity, and pain.   ACTIVITY LIMITATIONS: carrying, lifting, bending, standing, squatting, sleeping, stairs, transfers, bed mobility, and locomotion level  PARTICIPATION  LIMITATIONS: meal prep, cleaning, shopping, and community activity  PERSONAL FACTORS: MI, stroke, back surgery (lumbar laminectomy with decompression and microdiscectomy), right THA, left TKA, former smoker and current vapor are also affecting patient's functional outcome.   REHAB POTENTIAL: Good  CLINICAL DECISION MAKING: Stable/uncomplicated  EVALUATION COMPLEXITY: Low   GOALS: Goals reviewed with patient? Yes  SHORT TERM GOALS: Target date: 11/24/2023 Arilla will be independent with her day 1 home exercise program Baseline: Started 10/27/2023 Goal status:   Met 11/19/2023  2.  Improve left knee active range of motion to 0 - 5 -110 degrees Baseline: 0 - 10 - 103 degrees Goal status: Met 11/26/2023   LONG TERM GOALS: Target date: 01/10/2024  Improve patient specific functional score to 5.5 Baseline: 3.5 Goal status: Met 11/26/2023  2.  Citlaly will report left knee pain consistently 0-3/10 on the visual analog scale Baseline: 0-5/10 Goal status: Ongoing 12/08/2023  3.  Improve left knee active range of motion to 0 - 3 - 115 degrees Baseline: 0 - 10 - 103 degrees Goal status: Ongoing  11/26/2023  4.  Improve bilateral knee strength for quadriceps to at least 4/5 MMT Baseline: See objective Goal status: Met 11/26/2023  5.  Zunaira will be independent and compliant with her long-term home maintenance exercise program at discharge  Baseline: Started 10/27/2023 Goal status: Ongoing  12/08/2023   PLAN:  PT FREQUENCY: 2 x a week  PT DURATION: Through 01/10/2024  PLANNED INTERVENTIONS: 97750- Physical Performance Testing, 97110-Therapeutic exercises, 97530- Therapeutic activity, 97112- Neuromuscular re-education, 97535- Self Care, [id number]- Manual therapy, [phone]- Gait training, [phone]- Electrical stimulation (unattended), 97016- Vasopneumatic device, Patient/Family education, Balance training, Stair training, Joint mobilization, and Cryotherapy  PLAN FOR NEXT SESSION:   Last visit approved  #16.   SPC in Lt hand to help improve Rt leg stability as Rt abduction weakness is most limiting.      Ozell Silvan, PT, DPT, OCS, ATC 12/17/23  11:39 AM      Date of referral: 10/13/2023 Referring provider: Lonni CINDERELLA Poli, MD Referring diagnosis?  Diagnosis  Z96.652 (ICD-10-CM) - Status post total left knee replacement   Treatment diagnosis? (if different than referring diagnosis) R26.2   R60.0   M62.81   M25.662   M25.562  What was this (referring dx) caused by? Surgery (Type: TKA) and Arthritis  Nature of Condition: Initial Onset (within last 3 months)   Laterality: Lt  Current Functional Measure Score: Patient Specific Functional Scale 3.5  Objective measurements identify impairments when they are compared to normal values, the uninvolved extremity, and prior level of function.  [x]  Yes  []  No  Objective assessment of functional ability: Severe functional limitations   Briefly describe symptoms: Walking with a cane, poor sleep, poor function post total knee replacement  How did symptoms start: Osteoarthritis  Average pain intensity:  Last 24 hours: 0-5/10  Past week: 0-5/10  How often does the pt experience symptoms? Constantly  How much have the symptoms interfered with usual daily activities? Quite a bit  How has condition changed since care began at this facility?  Better standing and walking endurance, less instability and fear of falls  In general, how is the patients overall health? Good   BACK PAIN (STarT Back Screening Tool) No

## 2023-12-20 ENCOUNTER — Telehealth: Payer: Self-pay | Admitting: Physical Therapy

## 2023-12-20 ENCOUNTER — Encounter: Payer: Self-pay | Admitting: Physical Therapy

## 2023-12-20 ENCOUNTER — Encounter: Admitting: Physical Therapy

## 2023-12-20 NOTE — Telephone Encounter (Signed)
 I spoke to pt about her missed PT visit today and pt reporting she forgot it was on Monday this week. Pt was reminded of her upcoming appointment on Wednesday 12/22/23 at 1345.  Delon Lunger, PT, MPT 12/20/23 2:25 PM

## 2023-12-21 NOTE — Therapy (Signed)
 OUTPATIENT PHYSICAL THERAPY TREATMENT/DISCHARGE NOTE   Patient Name: [name] Good MRN: [id number] DOB:08/01/1950, 73 y.o., female Today's Date: 12/22/2023  PHYSICAL THERAPY DISCHARGE SUMMARY  Visits from Start of Care: 16  Current functional level related to goals / functional outcomes: See below   Remaining deficits: See below    Education / Equipment: HEP   Patient agrees to discharge. Patient goals were partially met. Patient is being discharged due to being pleased with the current functional level.   END OF SESSION:  PT End of Session - 12/22/23 1401     Visit Number 16    Number of Visits 24    Date for PT Re-Evaluation 01/10/24    Authorization Type [employer] Medicare    Authorization Time Period [facility] MEDICARE $15 COPAY 16 VISITS APPROVED 7/2-8/27/25    Authorization - Number of Visits 16    PT Start Time 1357    PT Stop Time 1428    PT Time Calculation (min) 31 min    Activity Tolerance Patient limited by pain    Behavior During Therapy WFL for tasks assessed/performed            Past Medical History:  Diagnosis Date   Anxiety    Arthritis    Depression    GERD (gastroesophageal reflux disease)    Myocardial infarction (HCC) 89   no visits to cardiac dr(thomas kelly) since 89   Stroke (HCC)    Wound dehiscence    lumbar   Past Surgical History:  Procedure Laterality Date   APPENDECTOMY     17 yrs   BACK SURGERY     BREAST SURGERY Left    cyst   BUBBLE STUDY  03/12/2020   Procedure: BUBBLE STUDY;  Surgeon: Claudene Pacific, MD;  Location: [facility];  Service: Cardiovascular;;   CARDIAC CATHETERIZATION  62   CHOLECYSTECTOMY     73 yrs old   ESOPHAGOGASTRODUODENOSCOPY N/A 10/05/2023   Procedure: EGD (ESOPHAGOGASTRODUODENOSCOPY);  Surgeon: San Sandor GAILS, DO;  Location: [facility];  Service: Gastroenterology;  Laterality: N/A;   HOT HEMOSTASIS N/A 10/05/2023   Procedure: EGD, WITH ARGON PLASMA COAGULATION;  Surgeon: San Sandor GAILS, DO;   Location: [facility];  Service: Gastroenterology;  Laterality: N/A;   LUMBAR LAMINECTOMY/DECOMPRESSION MICRODISCECTOMY N/A 06/14/2015   Procedure: Lumbar Three-Four,Lumbar Four-Five, Lumbar Five-Sacral One Laminectomy;  Surgeon: Alm GORMAN Molt, MD;  Location: [facility];  Service: Neurosurgery;  Laterality: N/A;   LUMBAR WOUND DEBRIDEMENT N/A 07/24/2015   Procedure: lumbar wound revision;  Surgeon: Alm GORMAN Molt, MD;  Location: [facility];  Service: Neurosurgery;  Laterality: N/A;   TEE WITHOUT CARDIOVERSION N/A 03/12/2020   Procedure: TRANSESOPHAGEAL ECHOCARDIOGRAM (TEE);  Surgeon: Claudene Pacific, MD;  Location: [facility] ENDOSCOPY;  Service: Cardiovascular;  Laterality: N/A;   TOTAL HIP ARTHROPLASTY Right 10/25/2015   Procedure: RIGHT TOTAL HIP ARTHROPLASTY ANTERIOR APPROACH;  Surgeon: Lonni CINDERELLA Poli, MD;  Location: [facility] ORS;  Service: Orthopedics;  Laterality: Right;   TOTAL KNEE ARTHROPLASTY Left 08/31/2023   Procedure: ARTHROPLASTY, KNEE, TOTAL;  Surgeon: Poli Lonni CINDERELLA, MD;  Location: [facility] OR;  Service: Orthopedics;  Laterality: Left;   TUBAL LIGATION     Patient Active Problem List   Diagnosis Date Noted   Iron deficiency anemia 10/04/2023   Dermatitis 10/01/2023   GI bleed 10/01/2023   OA (osteoarthritis) of knee 08/31/2023   Status post total left knee replacement 08/31/2023   Senile purpura (HCC) 08/05/2023   Supratherapeutic INR 07/05/2023   Unilateral primary osteoarthritis,  right knee 06/09/2023   Vitamin D  deficiency 02/16/2023   Dyslipidemia associated with type 2 diabetes mellitus (HCC) 01/01/2023   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   History of CVA (cerebrovascular accident) 12/07/2022   T2DM (type 2 diabetes mellitus) (HCC) 08/11/2022   Chronic pain syndrome 10/30/2021   Morbid obesity (HCC) 09/02/2021   Depression, major, single episode, moderate (HCC) 07/15/2021   Vertebral artery stenosis 07/15/2021   Cerebrovascular disease 07/15/2021   Left  ventricular apical thrombus 03/08/2020   RLS (restless legs syndrome) 10/02/2019   Arthritis 10/18/2017   Former smoker 11/13/2016   Autoimmune disease (HCC) 08/12/2016   Obesity (BMI 35.0-39.9 without comorbidity) 08/12/2016   Status post total replacement of right hip 10/25/2015   S/P lumbar laminectomy 06/14/2015    PCP: Worth CHRISTELLA Kitty, MD  REFERRING PROVIDER: Lonni CINDERELLA Poli, MD  REFERRING DIAG:  Diagnosis  [phone] (ICD-10-CM) - Status post total left knee replacement    THERAPY DIAG:  Acute pain of left knee  Difficulty in walking, not elsewhere classified  Muscle weakness (generalized)  Localized edema  Stiffness of left knee, not elsewhere classified  Rationale for Evaluation and Treatment: Rehabilitation  ONSET DATE: Aug 31, 2023  SUBJECTIVE:   SUBJECTIVE STATEMENT:  Patient arrives endorsing increased dizziness this visit, but no pain noted.   PERTINENT HISTORY: MI, stroke, back surgery (lumbar laminectomy with decompression and microdiscectomy), right THA, left TKA, former smoker and current vapor  PAIN:   NPRS scale: up to 4/10  Pain location: Rt knee and hip for 3/10, Lt hip Pain description: Achy, stiff, sore, can be sharp with weight-bearing Aggravating factors: Overuse with WB, maintaining prolonged postures Relieving factors: Taking a break from weight-bearing  PRECAUTIONS: Back  RED FLAGS: None   WEIGHT BEARING RESTRICTIONS: No  FALLS:  Has patient fallen in last 6 months? No  LIVING ENVIRONMENT: Lives with: lives with their family and lives with their spouse Lives in: Mobile home Stairs: Needs handrail Has following equipment at home: Single point cane, Environmental consultant - 2 wheeled, and Tour manager  OCCUPATION: Retired  PLOF: Independent with basic ADLs  PATIENT GOALS: Return to painting birdhouses  NEXT MD VISIT: October 16 at 1:30  OBJECTIVE:  Note: Objective measures were completed at Evaluation unless otherwise  noted.  DIAGNOSTIC FINDINGS: Interval total left knee arthroplasty without evidence of hardware failure.  PATIENT SURVEYS:   Patient-Specific Activity Scoring Scheme  0 represents "unable to perform." 10 represents "able to perform at prior level. 0 1 2 3 4 5 6 7 8 9  10 (Date and Score)   Activity Eval  10/27/2023 11/26/2023   1.  Sit to standing transfers   3  7.5   2. Walking independently  4  5  3.     4.    5.    Score 3.5 6.25   Total score = sum of the activity scores/number of activities Minimum detectable change (90%CI) for average score = 2 points Minimum detectable change (90%CI) for single activity score = 3 points  COGNITION: 10/27/2023 Overall cognitive status: Within functional limits for tasks assessed     SENSATION: .10/27/2023 Occasionally, not now.  Jocelin does note dizziness though.  EDEMA:  10/27/2023 Noted and not objectively assessed   LOWER EXTREMITY ROM:  Active ROM Left/Right  10/27/2023 Left  11/03/23 Left  11/12/23  supine Left 11/26/2023  Left 12/22/2023  Hip flexion       Hip extension       Hip abduction  Hip adduction       Hip internal rotation       Hip external rotation       Knee flexion 103/126 105 112* 110 100 (cramping in hamstring)  Knee extension -10/-5 -7 -9* -4   Ankle dorsiflexion       Ankle plantarflexion       Ankle inversion       Ankle eversion        (Blank rows = not tested)  LOWER EXTREMITY MMT:  MMT Right  10/27/2023 Left 10/27/2023 Left/Right 11/26/2023 Left/Right 12/10/2023  Hip flexion      Hip extension      Hip abduction      Hip adduction      Hip internal rotation      Hip external rotation      Knee flexion      Knee extension 4- 2+ 50.5/53.2 50.5/41.0  Ankle dorsiflexion      Ankle plantarflexion      Ankle inversion      Ankle eversion       (Blank rows = not tested)  GAIT: 12/17/2023: Arrival to clinic c FWW.  SPC use in clinic with cane in Lt UE with supervision guarding primarily    11/24/2023: FWW into clinic.  Trial of SPC in Lt hand to help Rt leg improved steadiness in gait despite Lt knee surgery.  This is due to past troubles with Rt leg.   Eval:  Distance walked: 200 feet Assistive device utilized: Environmental consultant - 2 wheeled Level of assistance: Complete Independence Comments: Brenlynn notes she has been using an assistive device for years due to bilateral knee pain                     TREATMENT        DATE: 12/22/2023 TherEx:  Nustep level 5 for 6 minutes with bilat UE and LE Discussed AROM and goals, continuing HEP at home, importance of motion, mobility, and strength Hooklying adduction ball squeeze 2x10 with 3s hold Hooklying abduction with blue TB 2x10 with 3s hold; verbal cues required for eccentric control with moderate carryover    TREATMENT        DATE: 12/17/2023 Therex: Supine hooklying clam shell with contralateral leg held still green band x 20 bilaterally Supine hooklying glute set. 5 sec hold x 10 Supine hooklying ball squeeze 5 sec hold x 10    TherActivity Nustep Lvl 5 5 mins x 2  UE/LE for ROM, endurance, push/pull and walking pattern improvements.  2 mins rest break in middle of activity to limit fatigue.  Sit to stand to sit from elevated table 24 inch height  x 10 , 2 x 5 with slow lowering focus    Neuro Re-ed [facility] pew anterior/posterior ankle strategy weight shifting 1.5 mins with SBA to CGA at times.  Retro stepping response x 10 in standing bilaterally with SBA.  Difficulty loading Rt leg to move Lt leg posterior.     TREATMENT        DATE: 12/15/2023 Therex: Standing hip abduction x 10 , performed bilaterally with bilateral HHA on bar    TherActivity Lateral stepping in // bars 10 ft x 3 each way with SBA and hand on bar.  Sit to stand from bar stool for slow lowering focus x 10  Nustep Lvl 5 5 mins UE/LE for ROM, endurance, push/pull and walking pattern improvements.   Neuro Re-ed Feet together stance EO 30 seconds, EC  30  seconds with SBA Modified tandem stance foot forward 30 sec x 2 bilateral with SBA Standing alternating PF/DF 2 x 10 with light hand assist on bar.    TREATMENT        DATE:  12/10/2023 Seated straight leg raises 2 sets of 5  Quad sets 2 sets of 10 for 5 seconds  Neuromuscular re-education: Feet together on foam: Eyes open; Eyes closed 5 x 20 seconds Dynamic heel to toe raises 2 sets of 10 x (PT assist as needed for balance, cues for step strategy) 1 set with hands, 1 set no hands Dynamic balance with cane (long strides and sequence left hand/cane with right foot)  Functional Activities: Mini-sit to stands in parallel bars 5 x slow eccentrics Hip hike in parallel bars 20 x (for gait with cane) One sided hip hike in parallel bars 10 x 3 seconds each side    PATIENT EDUCATION:  Eval  Education details: See above Person educated: Patient and Child(ren) Education method: Explanation, Demonstration, Tactile cues, Verbal cues, and Handouts Education comprehension: verbalized understanding, returned demonstration, verbal cues required, tactile cues required, and needs further education  HOME EXERCISE PROGRAM: Access Code: [email or web address] URL: [email or web address] Date: 12/10/2023 Prepared by: Lamar Ivory  Exercises - Supine Quadricep Sets  - 3 x daily - 7 x weekly - 2 sets - 10 reps - 5 second hold - Seated Knee Flexion AAROM  - 3 x daily - 7 x weekly - 1 sets - 5 reps - 10 seconds hold - Tandem Stance  - 1 x daily - 7 x weekly - 1 sets - 5 reps - 20 second hold - Standing Hip Hiking  - 2 x daily - 7 x weekly - 2 sets - 10 reps - 3 seconds hold - Clamshell  - 2 x daily - 7 x weekly - 2 sets - 10 reps - 3 seconds hold - Yoga Bridge  - 2 x daily - 7 x weekly - 1 sets - 10 reps - 5 seconds hold - Seated Active Straight-Leg Raise  - 3 x daily - 7 x weekly - 2 sets - 5 reps  ASSESSMENT:  CLINICAL IMPRESSION:  Patient arrived to session noting increased dizziness, though no  pain or soreness noted in Lt knee. PT discussed potential discharge with patient and performed overall assessment on patient with PT, patient, and patient's daughter agreeing upon discharge today with continuation of HEP within the home. Patient is a formal candidate for discharge at this time.   OBJECTIVE IMPAIRMENTS: Abnormal gait, cardiopulmonary status limiting activity, decreased activity tolerance, decreased balance, decreased endurance, decreased knowledge of condition, difficulty walking, decreased ROM, decreased strength, increased edema, impaired perceived functional ability, obesity, and pain.   ACTIVITY LIMITATIONS: carrying, lifting, bending, standing, squatting, sleeping, stairs, transfers, bed mobility, and locomotion level  PARTICIPATION LIMITATIONS: meal prep, cleaning, shopping, and community activity  PERSONAL FACTORS: MI, stroke, back surgery (lumbar laminectomy with decompression and microdiscectomy), right THA, left TKA, former smoker and current vapor are also affecting patient's functional outcome.   REHAB POTENTIAL: Good  CLINICAL DECISION MAKING: Stable/uncomplicated  EVALUATION COMPLEXITY: Low   GOALS: Goals reviewed with patient? Yes  SHORT TERM GOALS: Target date: 11/24/2023 [place] will be independent with her day 1 home exercise program Baseline: Started 10/27/2023 Goal status:   Met 11/19/2023  2.  Improve left knee active range of motion to 0 - 5 -110 degrees Baseline: 0 - 10 - 103 degrees Goal status: Met 11/26/2023  LONG TERM GOALS: Target date: 01/10/2024  Improve patient specific functional score to 5.5 Baseline: 3.5 Goal status: Met 11/26/2023  2.  Velicia will report left knee pain consistently 0-3/10 on the visual analog scale Baseline: 0-5/10 Goal status: Ongoing 12/08/2023  3.  Improve left knee active range of motion to 0 - 3 - 115 degrees Baseline: 0 - 10 - 103 degrees Goal status: Ongoing  11/26/2023  4.  Improve bilateral knee strength for  quadriceps to at least 4/5 MMT Baseline: See objective Goal status: Met 11/26/2023  5.  Takayla will be independent and compliant with her long-term home maintenance exercise program at discharge Baseline: Started 10/27/2023 Goal status: Ongoing  12/08/2023   PLAN:  PT FREQUENCY: 2 x a week  PT DURATION: Through 01/10/2024  PLANNED INTERVENTIONS: 97750- Physical Performance Testing, 97110-Therapeutic exercises, 97530- Therapeutic activity, 97112- Neuromuscular re-education, 97535- Self Care, [id number]- Manual therapy, [id number]- Gait training, [phone]- Electrical stimulation (unattended), [id number]- Vasopneumatic device, Patient/Family education, Balance training, Stair training, Joint mobilization, and Cryotherapy  PLAN FOR NEXT SESSION:     SPC in Lt hand to help improve Rt leg stability as Rt abduction weakness is most limiting.      Susannah Daring, PT, DPT 12/22/23 3:52 PM      Date of referral: 10/13/2023 Referring provider: Lonni CINDERELLA Poli, MD Referring diagnosis?  Diagnosis  Z96.652 (ICD-10-CM) - Status post total left knee replacement   Treatment diagnosis? (if different than referring diagnosis) R26.2   R60.0   M62.81   M25.662   M25.562  What was this (referring dx) caused by? Surgery (Type: TKA) and Arthritis  Nature of Condition: Initial Onset (within last 3 months)   Laterality: Lt  Current Functional Measure Score: Patient Specific Functional Scale 3.5  Objective measurements identify impairments when they are compared to normal values, the uninvolved extremity, and prior level of function.  [x]  Yes  []  No  Objective assessment of functional ability: Severe functional limitations   Briefly describe symptoms: Walking with a cane, poor sleep, poor function post total knee replacement  How did symptoms start: Osteoarthritis  Average pain intensity:  Last 24 hours: 0-5/10  Past week: 0-5/10  How often does the pt experience symptoms? Constantly  How much have  the symptoms interfered with usual daily activities? Quite a bit  How has condition changed since care began at this facility?  Better standing and walking endurance, less instability and fear of falls  In general, how is the patients overall health? Good   BACK PAIN (STarT Back Screening Tool) No

## 2023-12-22 ENCOUNTER — Ambulatory Visit (INDEPENDENT_AMBULATORY_CARE_PROVIDER_SITE_OTHER)

## 2023-12-22 DIAGNOSIS — R600 Localized edema: Secondary | ICD-10-CM | POA: Diagnosis not present

## 2023-12-22 DIAGNOSIS — M25662 Stiffness of left knee, not elsewhere classified: Secondary | ICD-10-CM | POA: Diagnosis not present

## 2023-12-22 DIAGNOSIS — M25562 Pain in left knee: Secondary | ICD-10-CM | POA: Diagnosis not present

## 2023-12-22 DIAGNOSIS — R262 Difficulty in walking, not elsewhere classified: Secondary | ICD-10-CM

## 2023-12-22 DIAGNOSIS — M6281 Muscle weakness (generalized): Secondary | ICD-10-CM | POA: Diagnosis not present

## 2023-12-28 ENCOUNTER — Other Ambulatory Visit: Payer: Self-pay | Admitting: Family Medicine

## 2024-01-03 ENCOUNTER — Ambulatory Visit: Admitting: Family Medicine

## 2024-01-04 ENCOUNTER — Other Ambulatory Visit: Payer: Self-pay

## 2024-01-04 ENCOUNTER — Other Ambulatory Visit

## 2024-01-04 ENCOUNTER — Ambulatory Visit (INDEPENDENT_AMBULATORY_CARE_PROVIDER_SITE_OTHER): Admitting: Family Medicine

## 2024-01-04 ENCOUNTER — Encounter: Payer: Self-pay | Admitting: Family Medicine

## 2024-01-04 VITALS — BP 129/73 | HR 91 | Temp 97.7°F | Ht 63.0 in | Wt 177.8 lb

## 2024-01-04 DIAGNOSIS — D508 Other iron deficiency anemias: Secondary | ICD-10-CM | POA: Diagnosis not present

## 2024-01-04 DIAGNOSIS — L280 Lichen simplex chronicus: Secondary | ICD-10-CM

## 2024-01-04 DIAGNOSIS — G894 Chronic pain syndrome: Secondary | ICD-10-CM | POA: Diagnosis not present

## 2024-01-04 DIAGNOSIS — L309 Dermatitis, unspecified: Secondary | ICD-10-CM

## 2024-01-04 DIAGNOSIS — D509 Iron deficiency anemia, unspecified: Secondary | ICD-10-CM | POA: Diagnosis not present

## 2024-01-04 DIAGNOSIS — E785 Hyperlipidemia, unspecified: Secondary | ICD-10-CM

## 2024-01-04 DIAGNOSIS — M359 Systemic involvement of connective tissue, unspecified: Secondary | ICD-10-CM

## 2024-01-04 DIAGNOSIS — F321 Major depressive disorder, single episode, moderate: Secondary | ICD-10-CM

## 2024-01-04 DIAGNOSIS — E1169 Type 2 diabetes mellitus with other specified complication: Secondary | ICD-10-CM

## 2024-01-04 DIAGNOSIS — E559 Vitamin D deficiency, unspecified: Secondary | ICD-10-CM | POA: Diagnosis not present

## 2024-01-04 LAB — COMPREHENSIVE METABOLIC PANEL WITH GFR
AG Ratio: 1.1 (calc) (ref 1.0–2.5)
ALT: 26 U/L (ref 6–29)
AST: 38 U/L — ABNORMAL HIGH (ref 10–35)
Albumin: 3.6 g/dL (ref 3.6–5.1)
Alkaline phosphatase (APISO): 113 U/L (ref 37–153)
BUN: 18 mg/dL (ref 7–25)
CO2: 26 mmol/L (ref 20–32)
Calcium: 9.2 mg/dL (ref 8.6–10.4)
Chloride: 104 mmol/L (ref 98–110)
Creat: 0.82 mg/dL (ref 0.60–1.00)
Globulin: 3.4 g/dL (ref 1.9–3.7)
Glucose, Bld: 77 mg/dL (ref 65–99)
Potassium: 4 mmol/L (ref 3.5–5.3)
Sodium: 139 mmol/L (ref 135–146)
Total Bilirubin: 0.4 mg/dL (ref 0.2–1.2)
Total Protein: 7 g/dL (ref 6.1–8.1)
eGFR: 75 mL/min/1.73m2 (ref >=60)

## 2024-01-04 LAB — VITAMIN B12: Vitamin B-12: 576 pg/mL (ref 200–1100)

## 2024-01-04 LAB — CBC
HCT: 33.9 % — ABNORMAL LOW (ref 36.0–46.0)
Hemoglobin: 10.7 g/dL — ABNORMAL LOW (ref 12.0–15.0)
MCHC: 31.6 g/dL (ref 30.0–36.0)
MCV: 81.2 fl (ref 78.0–100.0)
Platelets: 248 K/uL (ref 150.0–400.0)
RBC: 4.18 Mil/uL (ref 3.87–5.11)
RDW: 21.4 % — ABNORMAL HIGH (ref 11.5–15.5)
WBC: 7.3 K/uL (ref 4.0–10.5)

## 2024-01-04 LAB — HEMOGLOBIN A1C: Hgb A1c MFr Bld: 6.3 % (ref 4.6–6.5)

## 2024-01-04 LAB — IRON,TIBC AND FERRITIN PANEL
%SAT: 14 % — ABNORMAL LOW (ref 16–45)
Ferritin: 26 ng/mL (ref 16–288)
Iron: 59 ug/dL (ref 45–160)
TIBC: 412 ug/dL (ref 250–450)

## 2024-01-04 LAB — TSH: TSH: 1.88 m[IU]/L (ref 0.40–4.50)

## 2024-01-04 LAB — FOLATE: Folate: 17.6 ng/mL

## 2024-01-04 LAB — LIPID PANEL
Cholesterol: 121 mg/dL (ref <200)
HDL: 56 mg/dL (ref >=50)
LDL Cholesterol (Calc): 50 mg/dL
Non-HDL Cholesterol (Calc): 65 mg/dL (ref <130)
Total CHOL/HDL Ratio: 2.2 (calc) (ref <5.0)
Triglycerides: 71 mg/dL (ref <150)

## 2024-01-04 LAB — VITAMIN D 25 HYDROXY (VIT D DEFICIENCY, FRACTURES): Vit D, 25-Hydroxy: 68 ng/mL (ref 30–100)

## 2024-01-04 MED ORDER — CLOBETASOL PROPIONATE 0.05 % EX OINT
1.0000 | TOPICAL_OINTMENT | Freq: Two times a day (BID) | CUTANEOUS | 0 refills | Status: DC
Start: 2024-01-04 — End: 2024-01-25

## 2024-01-04 NOTE — Assessment & Plan Note (Signed)
Check A1c with labs. 

## 2024-01-04 NOTE — Assessment & Plan Note (Signed)
 Check lipids

## 2024-01-04 NOTE — Patient Instructions (Signed)
 It was very nice to see you today!  VISIT SUMMARY: You came in for a follow-up on your pain management and skin lesions. We discussed your current medications, skin condition, and overall health, including your arthritis and recent physical therapy progress.  YOUR PLAN: CHRONIC PAIN SYNDROME: Your chronic pain is being managed with Percocet, which requires regular follow-ups. -Continue taking Percocet as prescribed. -Schedule quarterly follow-ups to review your pain management.  DERMATITIS: You have persistent skin lesions that are itchy and painful. We are considering several possible causes, including dermatitis, bed bug bites, and fungal infection. -Keep your dermatology appointment as scheduled. -We will perform a biopsy of the affected area. -Use clobetasol  or halobetasol cream as prescribed. -Avoid picking or scratching the lesions. -We will conduct blood work to check for vitamin D  and folic acid  deficiencies.  Return in about 3 months (around 04/04/2024).   Take care, Dr Kennyth  PLEASE NOTE:  If you had any lab tests, please let us  know if you have not heard back within a few days. You may see your results on mychart before we have a chance to review them but we will give you a call once they are reviewed by us .   If we ordered any referrals today, please let us  know if you have not heard from their office within the next week.   If you had any urgent prescriptions sent in today, please check with the pharmacy within an hour of our visit to make sure the prescription was transmitted appropriately.   Please try these tips to maintain a healthy lifestyle:  Eat at least 3 REAL meals and 1-2 snacks per day.  Aim for no more than 5 hours between eating.  If you eat breakfast, please do so within one hour of getting up.   Each meal should contain half fruits/vegetables, one quarter protein, and one quarter carbs (no bigger than a computer mouse)  Cut down on sweet beverages. This  includes juice, soda, and sweet tea.   Drink at least 1 glass of water with each meal and aim for at least 8 glasses per day  Exercise at least 150 minutes every week.

## 2024-01-04 NOTE — Assessment & Plan Note (Signed)
 She has not had any significant improvement.  She does have upcoming appointment with dermatologist in a couple of months however they would like for us  to pursue biopsy at this point.  This was performed today.  See below procedure note.  She tolerated well.  Will try topical clobetasol .

## 2024-01-04 NOTE — Assessment & Plan Note (Signed)
 Check Vitamin D

## 2024-01-04 NOTE — Progress Notes (Signed)
 [name] Good is a 73 y.o. female who presents today for an office visit.  Assessment/Plan:  Chronic Problems Addressed Today: Chronic pain syndrome She is on Percocet 10-325 5 times daily as needed.  Medications help with ability perform activities of daily living.  We did discuss need for appointments every 3 months.  We did try Lyrica  at her most recent office visit however she discontinued as it was not effective.  Current pain is manageable.  We did discuss referral to pain clinic closer to her house however they declined for now.  Does not need refill today.  Follow-up in 3 months.  Dermatitis She has not had any significant improvement.  She does have upcoming appointment with dermatologist in a couple of months however they would like for us  to pursue biopsy at this point.  This was performed today.  See below procedure note.  She tolerated well.  Will try topical clobetasol .  Autoimmune disease (HCC) No longer sees rheumatology.  She was released on methotrexate however self discontinued.  On prednisone  5 mg daily.  T2DM (type 2 diabetes mellitus) (HCC) Check A1c with labs.  Dyslipidemia associated with type 2 diabetes mellitus (HCC) Check lipids.  Depression, major, single episode, moderate (HCC) Symptoms are manageable on Lexapro  20 mg daily.  Vitamin D  deficiency Check Vitamin D   Iron deficiency anemia Check iron panel     Subjective:  HPI:  See assessment / plan for status of chronic conditions.   Discussed the use of AI scribe software for clinical note transcription with the patient, who gave verbal consent to proceed.  History of Present Illness [name] Good [name] Good is a 73 year old female who presents for a three-month follow-up for pain management and skin lesions. She is accompanied by her daughter.  She is on a pain management regimen that includes Percocet, which she has been taking for an extended period. Her previous doctor scheduled monthly  visits, but the current schedule is every three months due to medication requirements. She inquired about extending the interval between visits.  She has skin lesions on her arm, which she suspects might be related to bed bugs, although her family has found no evidence of them. The lesions are itchy and painful, with exudate when she picks at them. She has been using a lotion and bath wash as previously advised, but these have not alleviated her symptoms. She has an upcoming dermatology appointment and has been using a cream prescribed by a previous doctor, which she feels is ineffective.  She has a history of arthritis and was previously prescribed vitamin D  by a rheumatologist, whom she no longer sees. Her daughter expressed concerns about the number of medications prescribed by the rheumatologist, including methotrexate and Plaquenil , leading to the discontinuation of those visits. She is unsure if she should continue taking vitamin D .  She completed physical therapy for knee pain and reports significant improvement in mobility. She uses a cane at home and a walker for longer distances when outside. She is scheduled for a follow-up with her orthopedic doctor next month.  She has been trying to increase her water intake by using flavored water to stay hydrated. She is not currently taking Lyrica , as she did not find it effective for her anxiety related to knee pain.         Objective:  Physical Exam: BP 129/73   Pulse 91   Temp 97.7 F (36.5 C) (Temporal)   Ht 5' 3 (1.6 m)  Wt 177 lb 12.8 oz (80.6 kg)   SpO2 97%   BMI 31.50 kg/m   Gen: No acute distress, resting comfortably CV: Regular rate and rhythm with no murmurs appreciated Pulm: Normal work of breathing, clear to auscultation bilaterally with no crackles, wheezes, or rhonchi Skin: Erythematous inflamed nodules noted on upper extremities bilaterally with scattered excoriations Neuro: Grossly normal, moves all extremities Psych:  Normal affect and thought content  Procedure note: After informed verbal consent was obtained, using Betadine  for cleansing and 1% Lidocaine  with epinephrine  for anesthetic, with sterile technique a 3 mm punch biopsy was used to obtain a biopsy specimen of the lesion. Hemostasis was obtained by pressure and wound was not sutured. Antibiotic dressing is applied, and wound care instructions provided. Be alert for any signs of cutaneous infection. The specimen is labeled and sent to pathology for evaluation. The procedure was well tolerated without complications.       Worth HERO. Kennyth, MD 01/04/2024 12:18 PM

## 2024-01-04 NOTE — Assessment & Plan Note (Signed)
 She is on Percocet 10-325 5 times daily as needed.  Medications help with ability perform activities of daily living.  We did discuss need for appointments every 3 months.  We did try Lyrica  at her most recent office visit however she discontinued as it was not effective.  Current pain is manageable.  We did discuss referral to pain clinic closer to her house however they declined for now.  Does not need refill today.  Follow-up in 3 months.

## 2024-01-04 NOTE — Assessment & Plan Note (Signed)
 No longer sees rheumatology.  She was released on methotrexate however self discontinued.  On prednisone  5 mg daily.

## 2024-01-04 NOTE — Assessment & Plan Note (Signed)
 Check iron panel

## 2024-01-04 NOTE — Assessment & Plan Note (Signed)
 Symptoms are manageable on Lexapro  20 mg daily.

## 2024-01-06 ENCOUNTER — Ambulatory Visit: Payer: Self-pay | Admitting: Family Medicine

## 2024-01-06 NOTE — Progress Notes (Signed)
 Her blood counts are improving from a couple of months ago.  Her A1c is up a little bit to 6.3.  This is in the prediabetic range.  She should work on decreasing sweets and carbs and we can recheck again in 3 to 6 months.  The rest of her labs are all stable.  We can recheck again at next office visit.  Do not need to make any changes to treatment plan at this time.

## 2024-01-07 LAB — DERMATOLOGY PATHOLOGY

## 2024-01-08 LAB — VITAMIN B1: Vitamin B1 (Thiamine): 20 nmol/L (ref 8–30)

## 2024-01-10 ENCOUNTER — Telehealth: Payer: Self-pay | Admitting: *Deleted

## 2024-01-10 NOTE — Telephone Encounter (Signed)
 Copied from CRM [id number]. Topic: Clinical - Lab/Test Results >> Jan 10, 2024 11:54 AM Corin V wrote: Reason for CRM: Patient is calling to follow up on her biopsy results. Please call back at [phone]   Duplicated note  Kaleo Condrey,RMA

## 2024-01-10 NOTE — Progress Notes (Signed)
 Her thiamine level is normal

## 2024-01-10 NOTE — Telephone Encounter (Signed)
 Copied from CRM #[id number]. Topic: Clinical - Lab/Test Results >> Jan 07, 2024  4:12 PM Burnard DEL wrote: Reason for CRM: Patient called in to receive lab results.Results were relayed to patient and she verbalized understanding   Noted in results note  Yavier Snider,RMA

## 2024-01-13 ENCOUNTER — Telehealth: Payer: Self-pay | Admitting: *Deleted

## 2024-01-13 NOTE — Telephone Encounter (Signed)
 Copied from CRM [id number]. Topic: Clinical - Lab/Test Results >> Jan 13, 2024 10:38 AM Avram MATSU wrote: Reason for CRM: I tried calling the cal, the patient would like for someone to call her to discuss her test results, Please advise [phone]   See results note  [facility]

## 2024-01-17 ENCOUNTER — Other Ambulatory Visit: Payer: Self-pay | Admitting: Family Medicine

## 2024-01-21 ENCOUNTER — Other Ambulatory Visit: Payer: Self-pay | Admitting: Family Medicine

## 2024-01-22 ENCOUNTER — Other Ambulatory Visit: Payer: Self-pay | Admitting: Family Medicine

## 2024-01-25 ENCOUNTER — Other Ambulatory Visit: Payer: Self-pay | Admitting: Family Medicine

## 2024-02-10 ENCOUNTER — Ambulatory Visit: Payer: Self-pay

## 2024-02-10 ENCOUNTER — Ambulatory Visit: Admitting: Physician Assistant

## 2024-02-10 ENCOUNTER — Encounter: Payer: Self-pay | Admitting: Physician Assistant

## 2024-02-10 DIAGNOSIS — Z96652 Presence of left artificial knee joint: Secondary | ICD-10-CM

## 2024-02-10 NOTE — Progress Notes (Signed)
 HPI: Mrs. 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  She states she is finished with therapy.  She is overall doing well has no complaints.  Still uses walker to ambulate outside of the home.  She is at this point happy that she underwent the surgery however the first few months were difficult.  Review of systems: See HPI otherwise negative  Physical exam: General Well-developed well-nourished female who ambulates with a rolling walker.  No acute distress Left knee: Surgical incisions well-healed.  Full extension actively flexion to approximately 110 degrees actively.  Calf supple nontender.  Dorsiflexion plantarflexion left ankle intact.   RadiographsLeft knee 2 views: Knee is well located.  No acute fractures acute findings.  Status post left total knee arthroplasty well-seated components.  Impression: Status post left total knee arthroplasty 08/31/2023  Plan: She will continue to work on range of motion and strengthening.  She will follow-up with us  at 1 year postop sooner if there is any questions concerns.  Questions were encouraged and answered patient and her daughter is present.

## 2024-02-14 ENCOUNTER — Other Ambulatory Visit: Payer: Self-pay | Admitting: Family Medicine

## 2024-02-23 ENCOUNTER — Other Ambulatory Visit: Payer: Self-pay | Admitting: Family Medicine

## 2024-02-28 ENCOUNTER — Encounter: Payer: Self-pay | Admitting: Radiology

## 2024-03-15 ENCOUNTER — Ambulatory Visit: Admitting: Physician Assistant

## 2024-03-15 ENCOUNTER — Encounter: Payer: Self-pay | Admitting: Physician Assistant

## 2024-03-15 VITALS — BP 101/64

## 2024-03-15 DIAGNOSIS — F22 Delusional disorders: Secondary | ICD-10-CM | POA: Diagnosis not present

## 2024-03-15 DIAGNOSIS — L281 Prurigo nodularis: Secondary | ICD-10-CM | POA: Diagnosis not present

## 2024-03-15 DIAGNOSIS — L280 Lichen simplex chronicus: Secondary | ICD-10-CM | POA: Diagnosis not present

## 2024-03-15 MED ORDER — BETAMETHASONE DIPROPIONATE 0.05 % EX OINT: TOPICAL_OINTMENT | Freq: Two times a day (BID) | CUTANEOUS | 2 refills | Status: AC

## 2024-03-15 NOTE — Patient Instructions (Addendum)
 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 Your physician has selected the right strength medicine for your problem and area affected on the body. Please use your medication only as directed by your physician to prevent side effects.    Important Information  Due to recent changes in healthcare laws, you may see results of your pathology and/or laboratory studies on MyChart before the doctors have had a chance to review them. We understand that in some cases there may be results that are confusing or concerning to you. Please understand that not all results are received at the same time and often the doctors may need to interpret multiple results in order to provide you with the best plan of care or course of treatment. Therefore, we ask that you please give us  2 business days to thoroughly review all your results before contacting the office for clarification. Should we see a critical lab result, you will be contacted sooner.   If You Need Anything After Your Visit  If you have any questions or concerns for your doctor, please call our main line at [phone] If no one answers, please leave a voicemail as directed and we will return your call as soon as possible. Messages left after 4 pm will be answered the following business day.   You may also send us  a message via MyChart. We typically respond to MyChart messages within 1-2 business days.  For prescription refills, please ask your pharmacy to contact our office. Our fax number is [phone].  If you have an urgent issue when the clinic is closed that cannot wait until the next business day, you can page your doctor at the number below.    Please note that while  we do our best to be available for urgent issues outside of office hours, we are not available 24/7.   If you have an urgent issue and are unable to reach us , you may choose to seek medical care at your doctor's office, retail clinic, urgent care center, or emergency room.  If you have a medical emergency, please immediately call 911 or go to the emergency department. In the event of inclement weather, please call our main line at [phone] for an update on the status of any delays or closures.  Dermatology Medication Tips: Please keep the boxes that topical medications come in in order to help keep track of the instructions about where and how to use these. Pharmacies typically print the medication instructions only on the boxes and not directly on the medication tubes.   If your medication is too expensive, please contact our office at [phone] or send us  a message through MyChart.   We are unable to tell what your co-pay for medications will be in advance as this is different depending on your insurance coverage. However, we may be able to find a substitute medication at lower cost or fill out paperwork to get insurance to cover a needed medication.   If a prior authorization is required to get your medication covered by your insurance company, please allow us  1-2 business days to complete this process.  Drug prices often vary depending on where the prescription is filled and some pharmacies may offer cheaper prices.  The website www.goodrx.com contains coupons for medications through  different pharmacies. The prices here do not account for what the cost may be with help from insurance (it may be cheaper with your insurance), but the website can give you the price if you did not use any insurance.  - You can print the associated coupon and take it with your prescription to the pharmacy.  - You may also stop by our office during regular business hours and pick up a GoodRx coupon card.   - If you need your prescription sent electronically to a different pharmacy, notify our office through [facility] or by phone at [phone]

## 2024-03-15 NOTE — Progress Notes (Addendum)
 New Patient Visit   Subjective  [name] Good is a 73 y.o. female NEW PATIENT who presents for the following: Rash of arms and legs since May 2025. It started out as an itchy rash. She thinks there may be bugs. She states she has found some bugs and was going to bring them to her appointment but she forgot them. One of the bugs was a black bug that she found on a grape. She has seen more smaller bugs in her shower. She has been using a micro blade to scratch the areas. She has seen 3 different doctors for this but she does not feel like she was heard. She was given doxycycline  and TMC 0.5% ointment by Urgent Care but it did not help much. 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 He gave her clobetasol  ointment that did not help much. She using an OTC cream that contains ivermectin that helps a little bit. It comes and goes. States that the bugs form tunnels under the skin after she tries to pull them out.   Accompanied by daughter today.   The following portions of the chart were reviewed this encounter and updated as appropriate: medications, allergies, medical history  Review of Systems:  No other skin or systemic complaints except as noted in HPI or Assessment and Plan.  Objective  Well appearing patient in no apparent distress; mood and affect are within normal limits.   A focused examination was performed of the following areas: face, arms and legs    Relevant exam findings are noted in the Assessment and Plan.    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  These areas may be pink, hyperpigmented and may have excoriations and bumps (prurigo nodules- PN).  LSC/PN is commonly observed in uncontrolled atopic  dermatitis and other forms of eczema, and in other itchy skin conditions (eg, insect bites, scabies).  Sometimes it is not possible to know initial cause of LSC/PN if it has been present for a long time.  It generally responds well to treatment with high potency topical steroids.  It is important to stop rubbing/scratching the area in order to break the itch-scratch-rash-itch cycle, in order for the rash to resolve.   Treatment Plan: - reassured her that there are no bugs present and they are not in her blood as she suggested  - discussed that sometimes pruritus happens that makes us  think that there may be bugs present - recommended wet wraps with betamethasone  ointment to affected areas as directed  - Avoid picking/rubbing/scratching - Recommended she try puzzles or word searches or something to keep her mind busy so that she is not scratching her skin. - Lengthy conversation with patient's daughter, [name] Good, regarding diagnoses to include delusions of parasitosis.  - There are medications that are reported to have an association with delusional infestation - and one of which she is currently on - Ropinirole  (Requip ). I have discussed this with her daughter and I suggested she talk with her PCP.       DELUSIONS OF PARASITOSIS (HCC)   LICHEN SIMPLEX CHRONICUS   PRURIGO NODULARIS    Return in about 2 months (around 05/15/2024) for Lichen simplex chronicus follow up.  I, Roseline Hutchinson, CMA, am acting  as scribe for Zaydn Gutridge K, PA-C .   Documentation: I have reviewed the above documentation for accuracy and completeness, and I agree with the above.  Jadesola Poynter K, PA-C

## 2024-03-16 ENCOUNTER — Telehealth: Payer: Self-pay | Admitting: *Deleted

## 2024-03-16 NOTE — Telephone Encounter (Signed)
 Copied from CRM [id number]. Topic: Clinical - Medication Question >> Mar 16, 2024  8:54 AM Burnard DEL wrote: Reason for CRM: Patients daughter Ziva called in requesting a phone call from providers medical assistant in regards to one of her moms medications.   Unable to contact patient, Voice mail is full  [facility]

## 2024-03-17 NOTE — Telephone Encounter (Signed)
 Pt's daughter calling back stating she is wanting to discuss the rx: rOPINIRole  (REQUIP ) 3 MG tablet. Asking for a call back

## 2024-03-17 NOTE — Telephone Encounter (Signed)
 Please advise on starting requip . Please and thank you.

## 2024-03-20 ENCOUNTER — Ambulatory Visit: Payer: Self-pay

## 2024-03-20 NOTE — Telephone Encounter (Signed)
 This was prescribed in June. Do they need a refill?

## 2024-03-20 NOTE — Telephone Encounter (Signed)
  FYI Only or Action Required?: Action required by provider: request for appointment.  Patient was last seen in primary care on 01/04/2024 by Kennyth Worth HERO, MD.  Called Nurse Triage reporting Medication Problem.  Symptoms began several weeks ago.  Interventions attempted: Nothing.  Symptoms are: unchanged.Daughter states Requip  is causing hallucinations. Saw Dermatology as well. Asking for pt. To take something else for restless leg. Please advise daughter.  Triage Disposition: Call PCP Now  Patient/caregiver understands and will follow disposition?: Yes    Copied from CRM #[id number]. Topic: Clinical - Red Word Triage >> Mar 20, 2024  8:18 AM Adelita E wrote: [facility] that prompted transfer to Nurse Triage: Daughter, Nataleigh, would like mom off of rOPINIRole  (REQUIP ) 3 MG tablet as she is having hallucinations. Patient has restless and daughter is wanting an alternative medication. Reason for Disposition  [1] Caller has URGENT medicine question about med that primary care doctor (or NP/PA) or specialist prescribed AND [2] triager unable to answer question  Answer Assessment - Initial Assessment Questions 1. NAME of MEDICINE: What medicine(s) are you calling about?     Requip  2. QUESTION: What is your question? (e.g., double dose of medicine, side effect)     Side effect -hallucinations 3. PRESCRIBER: Who prescribed the medicine? Reason: if prescribed by specialist, call should be referred to that group.     Dr. Kennyth 4. SYMPTOMS: Do you have any symptoms? If Yes, ask: What symptoms are you having?  How bad are the symptoms (e.g., mild, moderate, severe)     hallucination 5. PREGNANCY:  Is there any chance that you are pregnant? When was your last menstrual period?     no  Protocols used: Medication Question Call-A-AH

## 2024-03-20 NOTE — Telephone Encounter (Signed)
 If she is having active hallucination then recommend they go to the Emergency Department now.   They should stop the requip  and follow up here to discuss her treatment plan. Reviewing her chart it looks like she is still low in iron which needs to be corrected as this can cause restless legs. If she is taking an iron supplement then we may need to look into getting an iron infusion.

## 2024-03-20 NOTE — Telephone Encounter (Signed)
Recommend she come in for appointment to discuss.

## 2024-03-20 NOTE — Telephone Encounter (Signed)
 Spoke with [name] Good and family member. Refuses to go to ER says it is more dizziness than hallucinations. Informed her to stop taking Requip  and office will to make appointment.

## 2024-03-20 NOTE — Telephone Encounter (Signed)
 Called pt and offered appt on Wednesday, 03/22/24, but pt declined due to being close to holiday and her not being able to drive herself. Patient has been scheduled for next opening on 03/27/24 @ 1:20 pm.

## 2024-03-20 NOTE — Telephone Encounter (Signed)
 Called and spoke to patient and she expressed that when she went to the dermatologist they expressed that she needed to stop the ropinirole . Patient expressed that she is taking it for restless leg syndrome. Wants to know what other medication would help. Patient also expressed that she is suffering many side affects from the ropinirole . Patient failed to discuss the affects as the other physician took her off of the medication.

## 2024-03-20 NOTE — Telephone Encounter (Signed)
 That is fine

## 2024-03-26 ENCOUNTER — Other Ambulatory Visit: Payer: Self-pay | Admitting: Family Medicine

## 2024-03-27 ENCOUNTER — Encounter: Payer: Self-pay | Admitting: Family Medicine

## 2024-03-27 ENCOUNTER — Ambulatory Visit: Admitting: Family Medicine

## 2024-03-27 VITALS — BP 122/66 | HR 92 | Temp 97.3°F | Ht 63.0 in | Wt 173.5 lb

## 2024-03-27 DIAGNOSIS — F22 Delusional disorders: Secondary | ICD-10-CM

## 2024-03-27 DIAGNOSIS — G2581 Restless legs syndrome: Secondary | ICD-10-CM

## 2024-03-27 NOTE — Assessment & Plan Note (Signed)
 Patient has been on Requip  3 mg nightly for many years though her dermatologist is concerned that this may be causing delusional parasitosis.  Advised them to stop this to see if this helps with the symptoms.  We did discuss alternative strategies for management for her restless legs.  Will check iron panel today among other labs including CBC, c-Met, magnesium , B12, and TSH.  If ferritin level is less than 75-100 would consider IV iron infusions.  May consider Mirapex in the future if still needs treatment for restless legs as she has not responded well to gabapentin  in the past.

## 2024-03-27 NOTE — Progress Notes (Signed)
   MEYLI BOICE is a 73 y.o. female who presents today for an office visit.  Assessment/Plan:  New/Acute Problems: Skin eruption/delusional parasitosis She is following with dermatology and they recommended that she discontinue Requip  as they believe this is causing her delusional parasitosis.  We are managing her restless legs as below.  They gave her topical cream to try as well as some strategies to avoid skin picking.  She will continue management per dermatology though may consider trial of atypical antipsychotic such as Seroquel or Zyprexa pending her response to above.  Chronic Problems Addressed Today: RLS (restless legs syndrome) Patient has been on Requip  3 mg nightly for many years though her dermatologist is concerned that this may be causing delusional parasitosis.  Advised them to stop this to see if this helps with the symptoms.  We did discuss alternative strategies for management for her restless legs.  Will check iron panel today among other labs including CBC, c-Met, magnesium , B12, and TSH.  If ferritin level is less than 75-100 would consider IV iron infusions.  May consider Mirapex in the future if still needs treatment for restless legs as she has not responded well to gabapentin  in the past.     Subjective:  HPI:  See assessment / plan for status of chronic conditions.  Discussed the use of AI scribe software for clinical note transcription with the patient, who gave verbal consent to proceed.  History of Present Illness KLEE KOLEK June is a 73 year old female who presents with concerns about medication side effects and skin issues.  Patient is here with her daughter today.  They saw dermatology recently for recurrent skin infections.  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  Patient reports constant skin sensation of bug bites and feeling like bugs coming out of her skin and on her insides.  They started her on a cream which does seem to be  improving her symptoms however the dermatologist told them that the Requip  may be exacerbating her symptoms.  She would like to discontinue this.  She has been on this for many years for restless legs and the Requip  usually works well to alleviate this.  She is currently on oxycodone  for pain control.  Gabapentin  is not been effective for her in the past.         Objective:  Physical Exam: BP 122/66   Pulse 92   Temp (!) 97.3 F (36.3 C) (Temporal)   Ht 5' 3 (1.6 m)   Wt 173 lb 8 oz (78.7 kg)   SpO2 96%   BMI 30.73 kg/m   Gen: No acute distress, resting comfortably CV: Regular rate and rhythm with no murmurs appreciated Pulm: Normal work of breathing, clear to auscultation bilaterally with no crackles, wheezes, or rhonchi Skin: Several excoriations noted on bilateral upper extremities Neuro: Grossly normal, moves all extremities Psych: Normal affect and thought content      Keelan Pomerleau M. Kennyth, MD 03/27/2024 2:31 PM

## 2024-03-27 NOTE — Patient Instructions (Signed)
 It was very nice to see you today!  VISIT SUMMARY: During your visit, we discussed your concerns about medication side effects, restless leg syndrome, dizziness, and skin issues. We made some changes to your medication and planned further tests to address these issues.  YOUR PLAN: RESTLESS LEGS SYNDROME: You have been experiencing involuntary leg movements and difficulty staying still at night. -We have discontinued Requip  due to side effects like dizziness and hallucinations. -We will check your iron and ferritin levels with a blood test. -If your ferritin levels are low, we will consider an iron infusion. -We will evaluate alternative medications like Nuropex after optimizing your iron levels.  DELUSIONAL PARASITOSIS: You feel like bugs are in your skin, which may have been worsened by Requip . -Continue using the topical treatment recommended by your dermatologist. -Implement behavioral strategies to distract yourself from focusing on your skin.  DIZZINESS: You have been experiencing dizziness, possibly related to Requip . -Discontinuing Requip  may help alleviate your dizziness.  SKIN ERUPTIONS: You have been experiencing skin issues, possibly due to dry skin and environmental factors. -Continue using the topical treatment recommended by your dermatologist. -Use protective measures like wearing long sleeves and arm covers.  IRON DEFICIENCY (SUSPECTED): You may have an iron deficiency contributing to your restless legs syndrome. -We will check your iron and ferritin levels with a blood test. -If your ferritin levels are low, we will consider an iron infusion.  Return if symptoms worsen or fail to improve.   Take care, Dr Kennyth  PLEASE NOTE:  If you had any lab tests, please let us  know if you have not heard back within a few days. You may see your results on mychart before we have a chance to review them but we will give you a call once they are reviewed by us .   If we ordered  any referrals today, please let us  know if you have not heard from their office within the next week.   If you had any urgent prescriptions sent in today, please check with the pharmacy within an hour of our visit to make sure the prescription was transmitted appropriately.   Please try these tips to maintain a healthy lifestyle:  Eat at least 3 REAL meals and 1-2 snacks per day.  Aim for no more than 5 hours between eating.  If you eat breakfast, please do so within one hour of getting up.   Each meal should contain half fruits/vegetables, one quarter protein, and one quarter carbs (no bigger than a computer mouse)  Cut down on sweet beverages. This includes juice, soda, and sweet tea.   Drink at least 1 glass of water with each meal and aim for at least 8 glasses per day  Exercise at least 150 minutes every week.

## 2024-03-28 ENCOUNTER — Ambulatory Visit: Payer: Self-pay | Admitting: Family Medicine

## 2024-03-28 DIAGNOSIS — D509 Iron deficiency anemia, unspecified: Secondary | ICD-10-CM

## 2024-03-28 LAB — COMPREHENSIVE METABOLIC PANEL WITH GFR
ALT: 23 U/L (ref 0–35)
AST: 30 U/L (ref 0–37)
Albumin: 3.7 g/dL (ref 3.5–5.2)
Alkaline Phosphatase: 101 U/L (ref 39–117)
BUN: 18 mg/dL (ref 6–23)
CO2: 27 meq/L (ref 19–32)
Calcium: 9.1 mg/dL (ref 8.4–10.5)
Chloride: 106 meq/L (ref 96–112)
Creatinine, Ser: 0.77 mg/dL (ref 0.40–1.20)
GFR: 75.79 mL/min (ref >60.00)
Glucose, Bld: 66 mg/dL — ABNORMAL LOW (ref 70–99)
Potassium: 4 meq/L (ref 3.5–5.1)
Sodium: 140 meq/L (ref 135–145)
Total Bilirubin: 0.3 mg/dL (ref 0.2–1.2)
Total Protein: 7 g/dL (ref 6.0–8.3)

## 2024-03-28 LAB — CBC
HCT: 37.9 % (ref 36.0–46.0)
Hemoglobin: 12.6 g/dL (ref 12.0–15.0)
MCHC: 33.2 g/dL (ref 30.0–36.0)
MCV: 90.8 fl (ref 78.0–100.0)
Platelets: 216 K/uL (ref 150.0–400.0)
RBC: 4.17 Mil/uL (ref 3.87–5.11)
RDW: 17.3 % — ABNORMAL HIGH (ref 11.5–15.5)
WBC: 8.3 K/uL (ref 4.0–10.5)

## 2024-03-28 LAB — IBC + FERRITIN
Ferritin: 31.3 ng/mL (ref 10.0–291.0)
Iron: 55 ug/dL (ref 42–145)
Saturation Ratios: 14.5 % — ABNORMAL LOW (ref 20.0–50.0)
TIBC: 379.4 ug/dL (ref 250.0–450.0)
Transferrin: 271 mg/dL (ref 212.0–360.0)

## 2024-03-28 LAB — TSH: TSH: 1.28 u[IU]/mL (ref 0.35–5.50)

## 2024-03-28 LAB — VITAMIN B12: Vitamin B-12: 389 pg/mL (ref 211–911)

## 2024-03-28 LAB — MAGNESIUM: Magnesium: 2 mg/dL (ref 1.5–2.5)

## 2024-03-28 NOTE — Progress Notes (Signed)
 Her ferritin is on the lower range of normal.  As we discussed at her office visit I believe this is probably contributing to her restless legs.  It would be a good idea for us  to have her set up for an iron infusion - can we please work on getting this set up.   All of her other labs are at goal.

## 2024-04-04 ENCOUNTER — Ambulatory Visit: Admitting: Family Medicine

## 2024-04-05 ENCOUNTER — Telehealth: Payer: Self-pay | Admitting: *Deleted

## 2024-04-05 NOTE — Addendum Note (Signed)
 Addended by: FRANCIS ROULEAU A on: 04/05/2024 03:56 PM   Modules accepted: Orders

## 2024-04-05 NOTE — Telephone Encounter (Signed)
 Patient daughter  called for information referring Referral for iron infusion  Referral order, patient daughter aware. Their office will call with appt information

## 2024-04-06 ENCOUNTER — Telehealth: Payer: Self-pay

## 2024-04-06 ENCOUNTER — Other Ambulatory Visit

## 2024-04-06 ENCOUNTER — Telehealth (HOSPITAL_COMMUNITY): Payer: Self-pay | Admitting: Family Medicine

## 2024-04-06 NOTE — Telephone Encounter (Signed)
 Patient referred to infusion pharmacy team for ambulatory infusion of IV iron.  Insurance - [employer] Medicare  Site of care - Site of care: [facility] Dx code - D50.9  IV Iron Therapy - Feraheme 510 mg IV x 2  Infusion appointments - Scheduling team will schedule patient as soon as possible.   [name] Good D. Marvena Tally, PharmD

## 2024-04-06 NOTE — Telephone Encounter (Signed)
 Auth Submission: NO AUTH NEEDED Site of care: Site of care: [facility] Payer: [employer] medicare Medication & CPT/J Code(s) submitted: Feraheme (ferumoxytol) [id number] Diagnosis Code:  Route of submission (phone, fax, portal):  Phone # Fax # Auth type: Buy/Bill PB Units/visits requested: 510mg  x 2 doses Reference number:  Approval from: 04/06/24 to 04/26/24

## 2024-04-11 ENCOUNTER — Ambulatory Visit

## 2024-04-11 VITALS — BP 128/73 | HR 95 | Temp 98.0°F | Resp 20 | Ht 63.0 in | Wt 172.0 lb

## 2024-04-11 DIAGNOSIS — D509 Iron deficiency anemia, unspecified: Secondary | ICD-10-CM

## 2024-04-11 MED ORDER — SODIUM CHLORIDE 0.9 % IV SOLN
510.0000 mg | Freq: Once | INTRAVENOUS | Status: AC
  Administered 2024-04-11: 14:00:00 510 mg via INTRAVENOUS
  Filled 2024-04-11: qty 17

## 2024-04-11 NOTE — Progress Notes (Signed)
 Diagnosis: Iron Deficiency Anemia  Provider:  Mannam, Praveen MD  Procedure: IV Infusion  IV Type: Peripheral, IV Location: R Forearm  Feraheme  (Ferumoxytol ), Dose: 510 mg  Infusion Start Time: 1419  Infusion Stop Time: 1435  Post Infusion IV Care: Observation period completed and Peripheral IV Discontinued  Discharge: Condition: Good, Destination: Home . AVS Provided  Performed by:  Clarisa Danser E, RN

## 2024-04-15 ENCOUNTER — Other Ambulatory Visit: Payer: Self-pay | Admitting: Family Medicine

## 2024-04-16 ENCOUNTER — Other Ambulatory Visit: Payer: Self-pay | Admitting: Family Medicine

## 2024-04-18 ENCOUNTER — Ambulatory Visit (INDEPENDENT_AMBULATORY_CARE_PROVIDER_SITE_OTHER)

## 2024-04-18 ENCOUNTER — Telehealth: Payer: Self-pay | Admitting: *Deleted

## 2024-04-18 VITALS — BP 125/65 | HR 91 | Temp 98.2°F | Resp 16 | Ht 63.0 in | Wt 174.0 lb

## 2024-04-18 DIAGNOSIS — D509 Iron deficiency anemia, unspecified: Secondary | ICD-10-CM | POA: Diagnosis not present

## 2024-04-18 MED ORDER — SODIUM CHLORIDE 0.9 % IV SOLN
510.0000 mg | Freq: Once | INTRAVENOUS | Status: AC
  Administered 2024-04-18: 510 mg via INTRAVENOUS
  Filled 2024-04-18: qty 17

## 2024-04-18 NOTE — Telephone Encounter (Signed)
 Copied from CRM #[id number]. Topic: General - Other >> Apr 18, 2024  3:58 PM Rosina BIRCH wrote: Reason for CRM: patient daughter called wanting to make a lab appointment for the patient for four weeks out because this was her last iron infusion. No lab orders in [phone]   Please advise Tonjua Rossetti,RMA

## 2024-04-18 NOTE — Progress Notes (Signed)
 Diagnosis:  Iron Deficiency Anemia  Provider:  Praveen Mannam MD  Procedure: IV Infusion  IV Type: Peripheral, IV Location: R Forearm   Feraheme  (Ferumoxytol ), Dose: 510 mg  Infusion Start Time: 1559  Infusion Stop Time: 1617  Post Infusion IV Care: Patient declined observation and Peripheral IV Discontinued  Discharge: Condition: Good, Destination: Home . AVS Declined  Performed by:  Felicia Both G Pilkington-Burchett, RN

## 2024-04-18 NOTE — Telephone Encounter (Signed)
 Ok to place orders for iron panel and CBC.

## 2024-04-19 NOTE — Telephone Encounter (Signed)
 Please schedule a lab appt  Future lab order

## 2024-04-21 NOTE — Telephone Encounter (Signed)
 Sche on 05/15/24 //aw

## 2024-04-25 NOTE — Progress Notes (Signed)
 [name] Good                                          MRN: [id number]   04/25/2024   The [facility] Quality Team Specialist reviewed this patient medical record for the purposes of chart review for care gap closure. The following were reviewed: abstraction for care gap closure-glycemic status assessment.    [facility] Quality Team

## 2024-05-16 ENCOUNTER — Other Ambulatory Visit: Payer: Medicare (Managed Care)

## 2024-05-16 DIAGNOSIS — D509 Iron deficiency anemia, unspecified: Secondary | ICD-10-CM | POA: Diagnosis not present

## 2024-05-16 DIAGNOSIS — D649 Anemia, unspecified: Secondary | ICD-10-CM

## 2024-05-16 LAB — CBC WITH DIFFERENTIAL/PLATELET
Basophils Absolute: 0.1 K/uL (ref 0.0–0.1)
Basophils Relative: 0.7 % (ref 0.0–3.0)
Eosinophils Absolute: 0.2 K/uL (ref 0.0–0.7)
Eosinophils Relative: 2.1 % (ref 0.0–5.0)
HCT: 42.3 % (ref 36.0–46.0)
Hemoglobin: 14.3 g/dL (ref 12.0–15.0)
Lymphocytes Relative: 33.4 % (ref 12.0–46.0)
Lymphs Abs: 2.5 K/uL (ref 0.7–4.0)
MCHC: 33.7 g/dL (ref 30.0–36.0)
MCV: 94.8 fl (ref 78.0–100.0)
Monocytes Absolute: 0.8 K/uL (ref 0.1–1.0)
Monocytes Relative: 10.1 % (ref 3.0–12.0)
Neutro Abs: 4 K/uL (ref 1.4–7.7)
Neutrophils Relative %: 53.7 % (ref 43.0–77.0)
Platelets: 228 K/uL (ref 150.0–400.0)
RBC: 4.46 Mil/uL (ref 3.87–5.11)
RDW: 14.2 % (ref 11.5–15.5)
WBC: 7.5 K/uL (ref 4.0–10.5)

## 2024-05-17 LAB — IRON,TIBC AND FERRITIN PANEL
%SAT: 38 % (ref 16–45)
Ferritin: 359 ng/mL — ABNORMAL HIGH (ref 16–288)
Iron: 115 ug/dL (ref 45–160)
TIBC: 305 ug/dL (ref 250–450)

## 2024-05-19 ENCOUNTER — Telehealth: Payer: Self-pay

## 2024-05-19 ENCOUNTER — Ambulatory Visit: Payer: Self-pay | Admitting: Family Medicine

## 2024-05-19 NOTE — Telephone Encounter (Signed)
 Will call patient when results are sent by Dr. Kennyth.

## 2024-05-19 NOTE — Progress Notes (Signed)
 Her iron levels are improving.  Hemoglobin is 14.3.  It is okay for her to transition to oral iron supplementation and discontinue the infusions.  Recommend taking ferrous sulfate 65 mg every other day.  She can get this over-the-counter.  We can recheck labs again in 3 to 6 months.

## 2024-05-19 NOTE — Telephone Encounter (Signed)
 Addressed in result notes

## 2024-05-20 ENCOUNTER — Other Ambulatory Visit: Payer: Self-pay | Admitting: Family Medicine

## 2024-05-23 ENCOUNTER — Ambulatory Visit: Admitting: Physician Assistant

## 2024-05-23 NOTE — Telephone Encounter (Signed)
 Copied from CRM #[id number]. Topic: Clinical - Lab/Test Results >> May 18, 2024  2:05 PM Antwanette L wrote: Reason for CRM: Kassi Esteve, the pt daughter is requesting a callback from a nurse regarding lab results. She was informed that the provider has not yet left any notes or recommendations, but she's still requesting a callback. She can be reached at [phone]. >> May 23, 2024  1:11 PM Macario HERO wrote: Patient daughter called, said she has not received a call as of yet and is requesting a call from provider nurse as soon as possible.

## 2024-05-23 NOTE — Telephone Encounter (Signed)
 Lab results given  Her iron levels are improving. Hemoglobin is 14.3. It is okay for her to transition to oral iron supplementation and discontinue the infusions. Recommend taking ferrous sulfate 65 mg every other day. She can get this over-the-counter. We can recheck labs again in 3 to 6 months.  Verbalized understanding

## 2024-06-01 ENCOUNTER — Encounter: Payer: Self-pay | Admitting: Family Medicine

## 2024-06-01 ENCOUNTER — Ambulatory Visit: Payer: Medicare (Managed Care) | Admitting: Family Medicine

## 2024-06-01 ENCOUNTER — Ambulatory Visit: Payer: Self-pay

## 2024-06-01 VITALS — BP 130/74 | HR 93 | Temp 97.3°F | Ht 63.0 in | Wt 173.4 lb

## 2024-06-01 DIAGNOSIS — J329 Chronic sinusitis, unspecified: Secondary | ICD-10-CM

## 2024-06-01 DIAGNOSIS — D509 Iron deficiency anemia, unspecified: Secondary | ICD-10-CM

## 2024-06-01 DIAGNOSIS — E1169 Type 2 diabetes mellitus with other specified complication: Secondary | ICD-10-CM

## 2024-06-01 DIAGNOSIS — G2581 Restless legs syndrome: Secondary | ICD-10-CM

## 2024-06-01 DIAGNOSIS — G894 Chronic pain syndrome: Secondary | ICD-10-CM

## 2024-06-01 MED ORDER — PRAMIPEXOLE DIHYDROCHLORIDE 0.25 MG PO TABS: 0.2500 mg | ORAL_TABLET | Freq: Three times a day (TID) | ORAL | 3 refills | Status: AC

## 2024-06-01 MED ORDER — DOXYCYCLINE HYCLATE 100 MG PO TABS: 100.0000 mg | ORAL_TABLET | Freq: Two times a day (BID) | ORAL | 0 refills | Status: AC

## 2024-06-01 NOTE — Progress Notes (Signed)
 "  [name] Good is a 74 y.o. female who presents today for an office visit.  Assessment/Plan:  New/Acute Problems: Sinusitis  No red flags.  Consistent with previous sinus infections.  She has amoxicillin allergy.  Will start doxycycline .  She has done well with this in the past with previous sinus infections per encouraged hydration.  She can use over-the-counter meds as needed.  We discussed reasons to return to care.  Follow-up as needed.  Chronic Problems Addressed Today: RLS (restless legs syndrome) We saw her a couple of months ago for this.  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  We did advise her to stop the Requip  however if she has significant issues with her restless legs and has since restarted.  At her most recent visit here we also checked an iron panel which did show low ferritin and we started her on iron infusions.  Most recent ferritin was at goal though she does still have persistent restless legs if she is not taking the Requip .  Will try switching to Mirapex  to see if this is better tolerated and does not have similar side effects.  She will follow-up with us  in a week or 2 to let us  know how she is doing with this transition.  Iron deficiency anemia Most recent iron panel is at goal.  Recommended they transition to oral iron every other day.  Does not need any transfusions at this point.  T2DM (type 2 diabetes mellitus) (HCC) Declined checking A1c today.  We we will recheck next visit here.  Chronic pain syndrome Stable on Percocet 10-325 5 times daily as needed.  Does not need refill today.  Tolerating well without significant side effects.  Medication is to help with her pain and functional mobility.  Follow-up in 3 months.     Subjective:  HPI:  See assessment / plan for status of chronic conditions.   Discussed the use of AI scribe software for clinical note transcription with the  patient, who gave verbal consent to proceed.  History of Present Illness [name] Good [name] Good is a 74 year old female who presents with severe headache and nausea.  She has been experiencing a severe headache that began late Monday night, which is unusual for her as she does not typically suffer from headaches. The headache improved slightly by Tuesday but returned with weakness and nausea on Wednesday morning. She suspects sinus issues as the cause, noting pressure in her ears and a sensation similar to past sinus headaches. No vision changes except for some difficulty seeing on Tuesday. No slurred speech or other stroke-like symptoms. No cough but reports pressure in both ears and facial pressure.  She has a history of restless legs syndrome and is currently taking ropinirole  (Requip ) at a dose of 3 mg, which she finds insufficient as it does not last all day. She attempted to reduce the dose to half but found it ineffective. She experiences restlessness and an inability to stay still without the medication. Additionally, she has been experiencing a sensation of bugs crawling on her skin, which has improved but persists slightly. She notes a grayish discoloration of her skin, which she can wash off but it returns. She has a dermatology appointment scheduled for next week.  She is on multiple medications, including pain medication, which can cause constipation. She is also taking irinotecan every other day for maintenance, which she started in December. Her iron levels  have improved to 350, which is above the target of 100, and she is transitioning from iron infusions to oral iron supplementation every other day to maintain levels and reduce gastrointestinal side effects such as constipation.         Objective:  Physical Exam: BP 130/74   Pulse 93   Temp (!) 97.3 F (36.3 C) (Temporal)   Ht 5' 3 (1.6 m)   Wt 173 lb 6.4 oz (78.7 kg)   SpO2 94%   BMI 30.72 kg/m   Gen: No acute distress,  resting comfortably HEENT: TMs are clear effusion bilaterally.  OP erythematous. CV: Regular rate and rhythm with no murmurs appreciated Pulm: Normal work of breathing, clear to auscultation bilaterally with no crackles, wheezes, or rhonchi Neuro: Grossly normal, moves all extremities Psych: Normal affect and thought content      [name] M. Kennyth, MD 06/01/2024 2:18 PM  "

## 2024-06-01 NOTE — Assessment & Plan Note (Signed)
 Declined checking A1c today.  We we will recheck next visit here.

## 2024-06-01 NOTE — Telephone Encounter (Signed)
 Spoke with patient daughter, stated patient has headache and and possible since pressure  Advise to schedule an OV

## 2024-06-01 NOTE — Patient Instructions (Signed)
 It was very nice to see you today!  VISIT SUMMARY: During your visit, we addressed your severe headache and nausea, which appear to be related to sinusitis. We also discussed your restless legs syndrome, iron deficiency anemia, and chronic pain management.  YOUR PLAN: ACUTE SINUSITIS: You have symptoms of sinusitis, including a severe headache, facial, and ear pressure. -You have been prescribed doxycycline . Please take it as directed. -If you do not see improvement by next week, please follow up.  RESTLESS LEGS SYNDROME: You are experiencing side effects from your current medication for restless legs syndrome. -You have been prescribed Mirapex  (pramipexole ) immediate release, to be taken three times a day as needed. -Monitor for any side effects and let us  know if you experience any issues.  IRON DEFICIENCY ANEMIA: Your iron levels have improved with oral supplementation. -Continue taking oral iron supplementation every other day.  CHRONIC PAIN: Your chronic pain is being managed with your current medication regimen. -Continue with your current pain management regimen.  Return in about 3 months (around 08/29/2024) for Follow Up.   Take care, Dr Kennyth  PLEASE NOTE:  If you had any lab tests, please let us  know if you have not heard back within a few days. You may see your results on mychart before we have a chance to review them but we will give you a call once they are reviewed by us .   If we ordered any referrals today, please let us  know if you have not heard from their office within the next week.   If you had any urgent prescriptions sent in today, please check with the pharmacy within an hour of our visit to make sure the prescription was transmitted appropriately.   Please try these tips to maintain a healthy lifestyle:  Eat at least 3 REAL meals and 1-2 snacks per day.  Aim for no more than 5 hours between eating.  If you eat breakfast, please do so within one hour of  getting up.   Each meal should contain half fruits/vegetables, one quarter protein, and one quarter carbs (no bigger than a computer mouse)  Cut down on sweet beverages. This includes juice, soda, and sweet tea.   Drink at least 1 glass of water with each meal and aim for at least 8 glasses per day  Exercise at least 150 minutes every week.

## 2024-06-01 NOTE — Telephone Encounter (Signed)
 FYI Only or Action Required?: Action required by provider: update on patient condition and rescheduled FU- declined Acute visit.  Patient was last seen in primary care on 03/27/2024 by Kennyth Worth HERO, MD.  Called Nurse Triage reporting Generalized Body Aches and Sinusitis.  Symptoms began several days ago.  Interventions attempted: OTC medications: sinus med and Rest, hydration, or home remedies.  Symptoms are: gradually worsening.  Triage Disposition: Home Care  Patient/caregiver understands and will follow disposition?: Yes  Message from [facility] sent at 06/01/2024  9:28 AM EST  Reason for Triage: Patient calling to cancel appnt today, but states she has fever, achy all over, sever pain in her head that's putting pressure on her ears, and nauseous   Reason for Disposition  [1] Sinus congestion as part of a cold AND [2] present < 10 days  Answer Assessment - Initial Assessment Questions Pt calling to reschedule her FU due to illness.   Starting Monday- Body aches, sinus pressure/headache, congestion. Denies fever, CP, SOB, dizziness, earache, sore throat, cough, or mucous.  OTC sinus meds with some improvement   FU rescheduled and patient encouraged to CB for Acute visit with worsening. ED/UC precautions understood.   1. LOCATION: Where does it hurt?      Bilateal sinus, face 2. ONSET: When did the sinus pain start?  (e.g., hours, days)      Monday 3. SEVERITY: How bad is the pain?   (Scale 0-10; or none, mild, moderate or severe)     8/10  4. RECURRENT SYMPTOM: Have you ever had sinus problems before? If Yes, ask: When was the last time? and What happened that time?      Has had before 5. NASAL CONGESTION: Is the nose blocked? If Yes, ask: Can you open it or must you breathe through your mouth?     Bilateral  6. NASAL DISCHARGE: Do you have discharge from your nose? If so ask, What color?     Nothing coming out  7. FEVER: Do you have a fever? If  Yes, ask: What is it, how was it measured, and when did it start?      Denies fever- just body aches 8. OTHER SYMPTOMS: Do you have any other symptoms? (e.g., sore throat, cough, earache, difficulty breathing)     Head pressure feels like its pressing on her ears.  Protocols used: Sinus Pain or Congestion-A-AH

## 2024-06-01 NOTE — Assessment & Plan Note (Signed)
 Most recent iron panel is at goal.  Recommended they transition to oral iron every other day.  Does not need any transfusions at this point.

## 2024-06-01 NOTE — Assessment & Plan Note (Signed)
 We saw her a couple of months ago for this.  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  We did advise her to stop the Requip  however if she has significant issues with her restless legs and has since restarted.  At her most recent visit here we also checked an iron panel which did show low ferritin and we started her on iron infusions.  Most recent ferritin was at goal though she does still have persistent restless legs if she is not taking the Requip .  Will try switching to Mirapex  to see if this is better tolerated and does not have similar side effects.  She will follow-up with us  in a week or 2 to let us  know how she is doing with this transition.

## 2024-06-01 NOTE — Assessment & Plan Note (Signed)
 Stable on Percocet 10-325 5 times daily as needed.  Does not need refill today.  Tolerating well without significant side effects.  Medication is to help with her pain and functional mobility.  Follow-up in 3 months.

## 2024-06-08 ENCOUNTER — Ambulatory Visit: Payer: Medicare (Managed Care) | Admitting: Family Medicine

## 2024-06-14 ENCOUNTER — Ambulatory Visit: Admitting: Physician Assistant

## 2024-08-31 ENCOUNTER — Ambulatory Visit: Payer: Medicare (Managed Care) | Admitting: Family Medicine

## 2024-11-13 ENCOUNTER — Ambulatory Visit
